# Patient Record
Sex: Female | Born: 1945 | Race: Black or African American | Hispanic: No | Smoking: Former smoker
Health system: Southern US, Community
[De-identification: ages and names within clinical notes are randomized; demographics above are authoritative.]

## PROBLEM LIST (undated history)

## (undated) ENCOUNTER — Emergency Department: Admission: EM | Payer: Self-pay | Source: Home / Self Care

## (undated) DIAGNOSIS — I10 Essential (primary) hypertension: Secondary | ICD-10-CM

## (undated) DIAGNOSIS — N189 Chronic kidney disease, unspecified: Secondary | ICD-10-CM

## (undated) DIAGNOSIS — I509 Heart failure, unspecified: Secondary | ICD-10-CM

## (undated) DIAGNOSIS — G459 Transient cerebral ischemic attack, unspecified: Secondary | ICD-10-CM

## (undated) DIAGNOSIS — N186 End stage renal disease: Secondary | ICD-10-CM

## (undated) DIAGNOSIS — I639 Cerebral infarction, unspecified: Secondary | ICD-10-CM

## (undated) DIAGNOSIS — M869 Osteomyelitis, unspecified: Secondary | ICD-10-CM

## (undated) DIAGNOSIS — Z9289 Personal history of other medical treatment: Secondary | ICD-10-CM

## (undated) DIAGNOSIS — M069 Rheumatoid arthritis, unspecified: Secondary | ICD-10-CM

## (undated) DIAGNOSIS — M79605 Pain in left leg: Secondary | ICD-10-CM

## (undated) DIAGNOSIS — E785 Hyperlipidemia, unspecified: Secondary | ICD-10-CM

## (undated) DIAGNOSIS — C9000 Multiple myeloma not having achieved remission: Secondary | ICD-10-CM

## (undated) DIAGNOSIS — Z862 Personal history of diseases of the blood and blood-forming organs and certain disorders involving the immune mechanism: Secondary | ICD-10-CM

## (undated) DIAGNOSIS — D279 Benign neoplasm of unspecified ovary: Secondary | ICD-10-CM

## (undated) DIAGNOSIS — E8881 Metabolic syndrome: Secondary | ICD-10-CM

## (undated) DIAGNOSIS — N289 Disorder of kidney and ureter, unspecified: Secondary | ICD-10-CM

## (undated) HISTORY — PX: OTHER SURGICAL HISTORY: SHX169

## (undated) HISTORY — PX: APPENDECTOMY: SHX54

## (undated) HISTORY — PX: ABDOMINAL HYSTERECTOMY: SHX81

## (undated) HISTORY — PX: HERNIA REPAIR: SHX51

---

## 2004-12-13 ENCOUNTER — Ambulatory Visit: Payer: Self-pay | Admitting: Internal Medicine

## 2005-01-06 ENCOUNTER — Ambulatory Visit: Payer: Self-pay | Admitting: Internal Medicine

## 2005-03-01 ENCOUNTER — Ambulatory Visit: Payer: Self-pay | Admitting: Family Medicine

## 2005-03-07 ENCOUNTER — Ambulatory Visit: Payer: Self-pay | Admitting: Internal Medicine

## 2005-04-04 ENCOUNTER — Ambulatory Visit: Payer: Self-pay | Admitting: Internal Medicine

## 2005-04-08 ENCOUNTER — Ambulatory Visit: Payer: Self-pay | Admitting: Internal Medicine

## 2005-05-08 ENCOUNTER — Ambulatory Visit: Payer: Self-pay | Admitting: Internal Medicine

## 2005-05-16 ENCOUNTER — Emergency Department: Payer: Self-pay | Admitting: Emergency Medicine

## 2005-05-16 ENCOUNTER — Other Ambulatory Visit: Payer: Self-pay

## 2005-05-23 ENCOUNTER — Emergency Department: Payer: Self-pay | Admitting: Unknown Physician Specialty

## 2005-06-08 ENCOUNTER — Ambulatory Visit: Payer: Self-pay | Admitting: Internal Medicine

## 2005-07-08 ENCOUNTER — Ambulatory Visit: Payer: Self-pay | Admitting: Internal Medicine

## 2005-07-13 ENCOUNTER — Ambulatory Visit: Payer: Self-pay | Admitting: Internal Medicine

## 2005-08-04 ENCOUNTER — Ambulatory Visit: Payer: Self-pay | Admitting: Family Medicine

## 2005-08-08 ENCOUNTER — Ambulatory Visit: Payer: Self-pay | Admitting: Internal Medicine

## 2005-08-11 ENCOUNTER — Emergency Department: Payer: Self-pay | Admitting: Emergency Medicine

## 2005-08-11 ENCOUNTER — Other Ambulatory Visit: Payer: Self-pay

## 2005-08-16 ENCOUNTER — Ambulatory Visit: Payer: Self-pay | Admitting: Unknown Physician Specialty

## 2005-09-06 ENCOUNTER — Ambulatory Visit: Payer: Self-pay | Admitting: Family Medicine

## 2005-09-08 ENCOUNTER — Ambulatory Visit: Payer: Self-pay | Admitting: Internal Medicine

## 2005-09-10 ENCOUNTER — Ambulatory Visit: Payer: Self-pay | Admitting: Internal Medicine

## 2005-10-06 ENCOUNTER — Ambulatory Visit: Payer: Self-pay | Admitting: Internal Medicine

## 2005-10-25 ENCOUNTER — Ambulatory Visit: Payer: Self-pay | Admitting: Family Medicine

## 2005-10-31 ENCOUNTER — Inpatient Hospital Stay: Payer: Self-pay | Admitting: Internal Medicine

## 2005-10-31 ENCOUNTER — Other Ambulatory Visit: Payer: Self-pay

## 2005-11-01 ENCOUNTER — Other Ambulatory Visit: Payer: Self-pay

## 2005-11-06 ENCOUNTER — Ambulatory Visit: Payer: Self-pay | Admitting: Internal Medicine

## 2005-12-06 ENCOUNTER — Ambulatory Visit: Payer: Self-pay | Admitting: Internal Medicine

## 2006-01-06 ENCOUNTER — Ambulatory Visit: Payer: Self-pay | Admitting: Internal Medicine

## 2006-02-05 ENCOUNTER — Ambulatory Visit: Payer: Self-pay | Admitting: Internal Medicine

## 2006-02-13 ENCOUNTER — Inpatient Hospital Stay: Payer: Self-pay | Admitting: Internal Medicine

## 2006-02-13 ENCOUNTER — Other Ambulatory Visit: Payer: Self-pay

## 2006-03-08 ENCOUNTER — Ambulatory Visit: Payer: Self-pay | Admitting: Internal Medicine

## 2006-04-08 ENCOUNTER — Ambulatory Visit: Payer: Self-pay | Admitting: Internal Medicine

## 2006-05-08 ENCOUNTER — Ambulatory Visit: Payer: Self-pay | Admitting: Internal Medicine

## 2006-06-07 ENCOUNTER — Emergency Department: Payer: Self-pay | Admitting: Internal Medicine

## 2006-06-07 ENCOUNTER — Other Ambulatory Visit: Payer: Self-pay

## 2006-06-13 ENCOUNTER — Ambulatory Visit: Payer: Self-pay | Admitting: Internal Medicine

## 2006-07-08 ENCOUNTER — Ambulatory Visit: Payer: Self-pay | Admitting: Internal Medicine

## 2006-08-08 ENCOUNTER — Ambulatory Visit: Payer: Self-pay | Admitting: Internal Medicine

## 2006-08-23 ENCOUNTER — Ambulatory Visit: Payer: Self-pay | Admitting: Unknown Physician Specialty

## 2006-09-08 ENCOUNTER — Ambulatory Visit: Payer: Self-pay | Admitting: Internal Medicine

## 2006-10-07 ENCOUNTER — Ambulatory Visit: Payer: Self-pay | Admitting: Internal Medicine

## 2006-11-07 ENCOUNTER — Ambulatory Visit: Payer: Self-pay | Admitting: Internal Medicine

## 2006-12-07 ENCOUNTER — Ambulatory Visit: Payer: Self-pay | Admitting: Internal Medicine

## 2007-01-07 ENCOUNTER — Ambulatory Visit: Payer: Self-pay | Admitting: Internal Medicine

## 2007-02-06 ENCOUNTER — Ambulatory Visit: Payer: Self-pay | Admitting: Internal Medicine

## 2007-02-15 ENCOUNTER — Ambulatory Visit: Payer: Self-pay | Admitting: Internal Medicine

## 2007-03-09 ENCOUNTER — Ambulatory Visit: Payer: Self-pay | Admitting: Internal Medicine

## 2007-04-09 ENCOUNTER — Ambulatory Visit: Payer: Self-pay | Admitting: Internal Medicine

## 2007-04-26 ENCOUNTER — Ambulatory Visit: Payer: Self-pay | Admitting: Internal Medicine

## 2007-05-09 ENCOUNTER — Ambulatory Visit: Payer: Self-pay | Admitting: Internal Medicine

## 2007-08-09 ENCOUNTER — Ambulatory Visit: Payer: Self-pay | Admitting: Internal Medicine

## 2007-08-16 ENCOUNTER — Ambulatory Visit: Payer: Self-pay | Admitting: Internal Medicine

## 2007-09-09 ENCOUNTER — Ambulatory Visit: Payer: Self-pay | Admitting: Internal Medicine

## 2007-10-07 ENCOUNTER — Ambulatory Visit: Payer: Self-pay | Admitting: Internal Medicine

## 2007-10-15 ENCOUNTER — Ambulatory Visit: Payer: Self-pay | Admitting: Internal Medicine

## 2007-11-07 ENCOUNTER — Ambulatory Visit: Payer: Self-pay | Admitting: Internal Medicine

## 2007-12-18 ENCOUNTER — Ambulatory Visit: Payer: Self-pay | Admitting: Internal Medicine

## 2007-12-28 ENCOUNTER — Ambulatory Visit: Payer: Self-pay | Admitting: Nephrology

## 2008-01-07 ENCOUNTER — Ambulatory Visit: Payer: Self-pay | Admitting: Internal Medicine

## 2008-02-06 ENCOUNTER — Ambulatory Visit: Payer: Self-pay | Admitting: Internal Medicine

## 2008-02-19 ENCOUNTER — Ambulatory Visit: Payer: Self-pay | Admitting: Internal Medicine

## 2008-03-08 ENCOUNTER — Ambulatory Visit: Payer: Self-pay | Admitting: Internal Medicine

## 2008-04-08 ENCOUNTER — Ambulatory Visit: Payer: Self-pay | Admitting: Internal Medicine

## 2008-06-08 ENCOUNTER — Ambulatory Visit: Payer: Self-pay | Admitting: Internal Medicine

## 2008-07-01 ENCOUNTER — Ambulatory Visit: Payer: Self-pay | Admitting: Internal Medicine

## 2008-07-08 ENCOUNTER — Ambulatory Visit: Payer: Self-pay | Admitting: Internal Medicine

## 2008-08-26 ENCOUNTER — Ambulatory Visit: Payer: Self-pay | Admitting: Internal Medicine

## 2008-09-08 ENCOUNTER — Ambulatory Visit: Payer: Self-pay | Admitting: Internal Medicine

## 2008-10-06 ENCOUNTER — Ambulatory Visit: Payer: Self-pay | Admitting: Internal Medicine

## 2008-10-28 ENCOUNTER — Ambulatory Visit: Payer: Self-pay | Admitting: Internal Medicine

## 2008-11-06 ENCOUNTER — Ambulatory Visit: Payer: Self-pay | Admitting: Internal Medicine

## 2008-12-01 ENCOUNTER — Ambulatory Visit: Payer: Self-pay | Admitting: Nephrology

## 2008-12-23 ENCOUNTER — Ambulatory Visit: Payer: Self-pay | Admitting: Internal Medicine

## 2009-01-06 ENCOUNTER — Ambulatory Visit: Payer: Self-pay | Admitting: Internal Medicine

## 2009-09-10 ENCOUNTER — Ambulatory Visit: Payer: Self-pay | Admitting: Rheumatology

## 2010-11-09 ENCOUNTER — Ambulatory Visit: Payer: Self-pay | Admitting: Vascular Surgery

## 2011-05-20 ENCOUNTER — Other Ambulatory Visit: Payer: Self-pay | Admitting: Nephrology

## 2011-06-27 ENCOUNTER — Ambulatory Visit: Payer: Self-pay | Admitting: Nephrology

## 2011-07-21 ENCOUNTER — Encounter: Payer: Self-pay | Admitting: Rheumatology

## 2012-05-15 ENCOUNTER — Ambulatory Visit: Payer: Self-pay | Admitting: Vascular Surgery

## 2012-06-26 ENCOUNTER — Ambulatory Visit: Payer: Self-pay | Admitting: Vascular Surgery

## 2012-07-24 ENCOUNTER — Ambulatory Visit: Payer: Self-pay | Admitting: Vascular Surgery

## 2012-08-10 ENCOUNTER — Emergency Department: Payer: Self-pay | Admitting: Emergency Medicine

## 2013-02-13 LAB — CBC
HCT: 32.2 % — ABNORMAL LOW (ref 35.0–47.0)
MCH: 29.5 pg (ref 26.0–34.0)
RDW: 17.8 % — ABNORMAL HIGH (ref 11.5–14.5)
WBC: 8.2 10*3/uL (ref 3.6–11.0)

## 2013-02-13 LAB — BASIC METABOLIC PANEL
Calcium, Total: 10 mg/dL (ref 8.5–10.1)
Co2: 34 mmol/L — ABNORMAL HIGH (ref 21–32)
Creatinine: 6.62 mg/dL — ABNORMAL HIGH (ref 0.60–1.30)
EGFR (African American): 7 — ABNORMAL LOW
EGFR (Non-African Amer.): 6 — ABNORMAL LOW
Glucose: 104 mg/dL — ABNORMAL HIGH (ref 65–99)
Sodium: 138 mmol/L (ref 136–145)

## 2013-02-14 ENCOUNTER — Inpatient Hospital Stay: Payer: Self-pay | Admitting: Internal Medicine

## 2013-02-15 LAB — BASIC METABOLIC PANEL
Anion Gap: 12 (ref 7–16)
Chloride: 96 mmol/L — ABNORMAL LOW (ref 98–107)
Creatinine: 9.95 mg/dL — ABNORMAL HIGH (ref 0.60–1.30)
EGFR (African American): 4 — ABNORMAL LOW
Glucose: 85 mg/dL (ref 65–99)
Osmolality: 282 (ref 275–301)
Potassium: 4.9 mmol/L (ref 3.5–5.1)

## 2013-02-15 LAB — CBC WITH DIFFERENTIAL/PLATELET
Basophil %: 0.7 %
Eosinophil #: 0.4 10*3/uL (ref 0.0–0.7)
Eosinophil %: 6.1 %
HGB: 10 g/dL — ABNORMAL LOW (ref 12.0–16.0)
MCHC: 33.4 g/dL (ref 32.0–36.0)
MCV: 92 fL (ref 80–100)
Monocyte #: 0.8 x10 3/mm (ref 0.2–0.9)
Monocyte %: 12.4 %
Neutrophil #: 3.2 10*3/uL (ref 1.4–6.5)
Neutrophil %: 49.1 %
Platelet: 240 10*3/uL (ref 150–440)
RBC: 3.27 10*6/uL — ABNORMAL LOW (ref 3.80–5.20)
WBC: 6.6 10*3/uL (ref 3.6–11.0)

## 2013-02-15 LAB — PHOSPHORUS: Phosphorus: 7.9 mg/dL — ABNORMAL HIGH (ref 2.5–4.9)

## 2013-02-16 LAB — RENAL FUNCTION PANEL
Chloride: 96 mmol/L — ABNORMAL LOW (ref 98–107)
Creatinine: 7.35 mg/dL — ABNORMAL HIGH (ref 0.60–1.30)
EGFR (Non-African Amer.): 5 — ABNORMAL LOW
Potassium: 4.7 mmol/L (ref 3.5–5.1)
Sodium: 134 mmol/L — ABNORMAL LOW (ref 136–145)

## 2013-02-16 LAB — CBC WITH DIFFERENTIAL/PLATELET
Basophil #: 0 10*3/uL (ref 0.0–0.1)
Basophil %: 0.6 %
Eosinophil #: 0.4 10*3/uL (ref 0.0–0.7)
Eosinophil %: 4.9 %
HCT: 30.5 % — ABNORMAL LOW (ref 35.0–47.0)
HGB: 10 g/dL — ABNORMAL LOW (ref 12.0–16.0)
Lymphocyte #: 2.2 10*3/uL (ref 1.0–3.6)
MCHC: 32.8 g/dL (ref 32.0–36.0)
MCV: 92 fL (ref 80–100)
Monocyte #: 1 x10 3/mm — ABNORMAL HIGH (ref 0.2–0.9)
Neutrophil #: 4 10*3/uL (ref 1.4–6.5)
Neutrophil %: 52.7 %
RBC: 3.33 10*6/uL — ABNORMAL LOW (ref 3.80–5.20)
RDW: 17.5 % — ABNORMAL HIGH (ref 11.5–14.5)

## 2013-02-18 LAB — RENAL FUNCTION PANEL
Albumin: 3.1 g/dL — ABNORMAL LOW (ref 3.4–5.0)
Anion Gap: 9 (ref 7–16)
BUN: 85 mg/dL — ABNORMAL HIGH (ref 7–18)
Chloride: 97 mmol/L — ABNORMAL LOW (ref 98–107)
Creatinine: 12.18 mg/dL — ABNORMAL HIGH (ref 0.60–1.30)
EGFR (African American): 3 — ABNORMAL LOW
EGFR (Non-African Amer.): 3 — ABNORMAL LOW
Osmolality: 289 (ref 275–301)
Phosphorus: 7 mg/dL — ABNORMAL HIGH (ref 2.5–4.9)
Sodium: 132 mmol/L — ABNORMAL LOW (ref 136–145)

## 2013-02-18 LAB — CBC WITH DIFFERENTIAL/PLATELET
HCT: 30.6 % — ABNORMAL LOW (ref 35.0–47.0)
HGB: 10 g/dL — ABNORMAL LOW (ref 12.0–16.0)
Lymphocytes: 25 %
MCV: 91 fL (ref 80–100)
Segmented Neutrophils: 65 %

## 2013-02-18 LAB — EXPECTORATED SPUTUM ASSESSMENT W GRAM STAIN, RFLX TO RESP C

## 2013-02-19 LAB — CULTURE, BLOOD (SINGLE)

## 2013-03-07 ENCOUNTER — Ambulatory Visit: Payer: Self-pay | Admitting: Internal Medicine

## 2014-03-26 ENCOUNTER — Observation Stay: Payer: Self-pay | Admitting: Internal Medicine

## 2014-03-26 LAB — COMPREHENSIVE METABOLIC PANEL
ALK PHOS: 82 U/L
ALT: 9 U/L — AB
Albumin: 3 g/dL — ABNORMAL LOW (ref 3.4–5.0)
Anion Gap: 9 (ref 7–16)
BUN: 19 mg/dL — ABNORMAL HIGH (ref 7–18)
Bilirubin,Total: 0.3 mg/dL (ref 0.2–1.0)
CALCIUM: 8.2 mg/dL — AB (ref 8.5–10.1)
CHLORIDE: 98 mmol/L (ref 98–107)
CREATININE: 5.17 mg/dL — AB (ref 0.60–1.30)
Co2: 31 mmol/L (ref 21–32)
EGFR (Non-African Amer.): 8 — ABNORMAL LOW
GFR CALC AF AMER: 9 — AB
Glucose: 76 mg/dL (ref 65–99)
OSMOLALITY: 277 (ref 275–301)
Potassium: 3.8 mmol/L (ref 3.5–5.1)
SGOT(AST): 16 U/L (ref 15–37)
SODIUM: 138 mmol/L (ref 136–145)
Total Protein: 8.6 g/dL — ABNORMAL HIGH (ref 6.4–8.2)

## 2014-03-26 LAB — PROTIME-INR
INR: 1.1
PROTHROMBIN TIME: 13.6 s (ref 11.5–14.7)

## 2014-03-26 LAB — CBC
HCT: 32.5 % — ABNORMAL LOW (ref 35.0–47.0)
HGB: 10.2 g/dL — ABNORMAL LOW (ref 12.0–16.0)
MCH: 28.8 pg (ref 26.0–34.0)
MCHC: 31.6 g/dL — ABNORMAL LOW (ref 32.0–36.0)
MCV: 91 fL (ref 80–100)
PLATELETS: 261 10*3/uL (ref 150–440)
RBC: 3.56 10*6/uL — AB (ref 3.80–5.20)
RDW: 17.1 % — ABNORMAL HIGH (ref 11.5–14.5)
WBC: 7.3 10*3/uL (ref 3.6–11.0)

## 2014-03-26 LAB — TROPONIN I
TROPONIN-I: 0.07 ng/mL — AB
TROPONIN-I: 0.06 ng/mL — AB
Troponin-I: 0.05 ng/mL

## 2014-03-26 LAB — APTT: ACTIVATED PTT: 36.4 s — AB (ref 23.6–35.9)

## 2014-03-26 LAB — CK TOTAL AND CKMB (NOT AT ARMC)
CK, TOTAL: 100 U/L
CK-MB: 1.4 ng/mL (ref 0.5–3.6)

## 2014-03-27 DIAGNOSIS — I359 Nonrheumatic aortic valve disorder, unspecified: Secondary | ICD-10-CM

## 2014-11-25 NOTE — Op Note (Signed)
PATIENT NAME:  Cassandra Joseph, Cassandra Joseph MR#:  [id number] DATE OF BIRTH:  1946-02-18  DATE OF PROCEDURE:  06/26/2012  PREOPERATIVE DIAGNOSES:  1. Complication AV dialysis device with poor flows and poor dialysis runs.  2. End-stage renal disease requiring hemodialysis.   POSTOPERATIVE DIAGNOSES:  1. Complication AV dialysis device with poor flows and poor dialysis runs.  2. End-stage renal disease requiring hemodialysis.   PROCEDURES PERFORMED:  1. Contrast injection right radiocephalic fistula.  2. Percutaneous transluminal angioplasty venous portion right radiocephalic fistula.   PROCEDURE PERFORMED BY: Katha Cabal, MD  SEDATION: Versed 3 mg plus fentanyl 150 mcg administered IV. Continuous ECG, pulse oximetry and cardiopulmonary monitoring is performed throughout the entire procedure by the interventional radiology nurse. Total sedation time was one hour.   ACCESS: 6 French sheath, antegrade direction right arm wrist fistula.   CONTRAST USED: Isovue 40 mL.   FLUOROSCOPY TIME: 1.3 minutes.   INDICATIONS: Cassandra Joseph is a 69 year old woman who was sent to the office by the dialysis center secondary to increasing difficulties with dialysis and poor efficiencies of her dialysis. The risks and benefits for contrast injection and treatment were reviewed. All questions answered. Patient agrees to proceed.   DESCRIPTION OF PROCEDURE: Patient is taken to special procedures, placed in supine position. After adequate sedation is achieved, she is positioned with her right arm extended palm upward. Right arm is prepped and draped in sterile fashion. 1% lidocaine is infiltrated into the soft tissues and access to the fistula is obtained with a micropuncture needle in an antegrade direction, microwire followed micro sheath, J-wire followed by 6 French sheath. Hand injection of contrast demonstrated a high-grade stenosis several centimeters from the arterial anastomosis. She subsequently then dilates at  the buttonholes. The venous outflow is otherwise well collateralized and free of stenoses including the central venous anatomy.   2000 units of heparin is given. The sheath is repositioned as superficially as possible and subsequently a 7 x 2 [place] balloon is advanced across the lesion, inflated to 26 atmospheres. One minute duration time. Follow-up angiography demonstrates significant improvement in the stricture, however, there is moderate extravasation. The wire and sheath are then pulled. Pursestring suture of 4-0 Monocryl placed and medium pressure is held for five minutes. There is immediate complication of hematoma at the site but it appears stable and not changing.   INTERPRETATION: Initial views of the fistula demonstrate the site to the buttonholes are nicely dilated and should be easily accessible. Venous outflow is widely patent in the proximal forearm and upper arm. Central venous anatomy is widely patent. Arterial anastomosis is widely patent as is the visualized portions of the brachial artery. Approximately 2 to 3 cm from the actual anastomosis there is a focal area of high-grade stricture which is essentially occlusive by the sheath. After dilatation to 7 mm this area now shows some extravasation but it is significantly improved in its diameter.   SUMMARY: Angioplasty venous portion of the right arm AV graft.   ____________________________ Katha Cabal, MD ggs:cms D: 06/26/2012 15:14:01 ET T: 06/26/2012 15:34:19 ET JOB#: [id number]  cc: Katha Cabal, MD, <Dictator> [employer] [employer] Tracie Harrier, MD Katha Cabal MD ELECTRONICALLY SIGNED 07/25/2012 16:22

## 2014-11-25 NOTE — Op Note (Signed)
PATIENT NAME:  Cassandra Joseph, Cassandra Joseph MR#:  [id number] DATE OF BIRTH:  11-07-1945  DATE OF PROCEDURE:  05/15/2012  PREOPERATIVE DIAGNOSES:  1. Complication of dialysis device, right arm fistula, with prolonged bleeding from the buttonhole. 2. End-stage renal disease requiring hemodialysis.   POSTOPERATIVE DIAGNOSES:  1. Complication of dialysis device, right arm fistula, with prolonged bleeding from the buttonhole. 2. End-stage renal disease requiring hemodialysis.   PROCEDURES PERFORMED: Contrast injection, right arm AV fistula.   SURGEON: Hortencia Pilar, MD  SEDATION: Versed 2 mg plus fentanyl 50 mcg administered IV. Continuous ECG, pulse oximetry and cardiopulmonary monitoring was performed throughout the entire procedure by the interventional radiology nurse. Total sedation time was 45 minutes.   ACCESS: 5 [place] microsheath, right arm AV fistula, antegrade direction.   CONTRAST USED: Isovue 20 mL.   FLUOROSCOPY TIME: 0.6 minutes.   INDICATIONS: [name] is a 69 year old woman who is currently maintained on hemodialysis via a right arm radiocephalic fistula. Recently she has had increased bleeding from buttonholes and she has been referred from dialysis for evaluation. The risks and benefits were reviewed, all questions answered, and the patient has agreed to proceed with contrast injection and possible intervention.   DESCRIPTION OF PROCEDURE: The patient is taken to special procedures and placed in the supine position. After adequate sedation is achieved, the left arm is extended palm upward and prepped and draped in sterile fashion. 1% lidocaine is infiltrated into the soft tissues and access to the fistula is obtained with a micropuncture needle in an antegrade direction. Microsheath is inserted. A stopcock is placed. Hand injection of contrast through the microsheath is then utilized to image the fistula. After review of the images, sheath is pulled after pursestring suture of 4-0  Monocryl is placed. There are no immediate complications.   INTERPRETATION: The fistula is noted to be widely patent. It is somewhat tortuous, however, in the areas of the two buttonholes it is dilated to greater than 10 mm in diameter. There are two areas of modest or moderate narrowing located before the buttonholes, in the antecubital fossa. Upper arm and central veins are all widely patent and quite enlarged. Arterial visualized portions of the brachial artery are widely patent as well.   Given that the area of narrowing is moderate at best and that it occurs before the buttonholes, there is no anatomic abnormality that is causing bleeding from the buttonholes themselves. More proximal to the areas of access, the venous system is widely patent. In fact it is enlarged throughout and flow is unobstructed back to the heart.   Perhaps this is an abnormality intrinsic to the buttonhole itself, but there does not appear to be a particular abnormality that should be treated at this time within the fistula. Therefore, no intervention was performed. Fistula appears to be quite adequate for continued dialysis.  ____________________________ Katha Cabal, MD ggs:slb D: 05/15/2012 11:48:16 ET T: 05/15/2012 11:57:05 ET JOB#: [id number]  cc: Katha Cabal, MD, <Dictator> Tracie Harrier, MD Murlean Iba, MD Katha Cabal MD ELECTRONICALLY SIGNED 05/23/2012 12:34

## 2014-11-25 NOTE — Op Note (Signed)
PATIENT NAME:  Cassandra Joseph, Cassandra Joseph MR#:  [id number] DATE OF BIRTH:  05/05/1946  DATE OF PROCEDURE:  07/24/2012  PREOPERATIVE DIAGNOSES:  1.  Complication of arteriovenous dialysis fistula with persistent bleeding from the distal buttonhole.  2.  End-stage renal disease requiring hemodialysis.  3.  Hypertension.   POSTOPERATIVE DIAGNOSES: 1.  Complication of arteriovenous dialysis fistula with persistent bleeding from the distal buttonhole.  2.  End-stage renal disease requiring hemodialysis.  3.  Hypertension.   PROCEDURES PERFORMED: Contrast injection right radiocephalic fistula.   SURGEON: Katha Cabal, M.D.  SEDATION: Versed 3 mg plus fentanyl 100 mcg administered IV. Continuous ECG, pulse oximetry and cardiopulmonary monitoring was performed throughout the entire procedure by the interventional radiology nurse. Total sedation time was 30 minutes.   ACCESS: 6-French sheath, retrograde direction, right radiocephalic fistula.   CONTRAST USED: Isovue 5 mL.   FLUOROSCOPY TIME: 0.8 minutes.   INDICATIONS: The patient is a 69 year old woman, who presents with persistent bleeding from her buttonhole. She is therefore undergoing evaluation for possible corrective cause. Risks and benefits were reviewed. The patient agrees to proceed.   DESCRIPTION OF PROCEDURE: The patient is taken to special procedures and placed in the supine position. After adequate sedation is achieved, she is positioned supine with her right arm positioned palm upward. The right arm is prepped and draped in sterile fashion. Ultrasound is placed in a sterile sleeve. The fistula is then followed and identified toward the level of the antecubital fossa. Micropuncture needle followed by microwires is then inserted under direct ultrasound visualization. The venous outflow of the fistula is patent, homogeneous and compressible indicating patency. Image is recorded for the permanent record. The puncture is made under direct  visualization. Micro sheath is inserted followed by J-wire, and then a 6-French sheath. Using a floppy Glidewire and a Kumpe catheter, the wire and catheter were negotiated into the arterial anastomosis of the fistula and then hand injection of contrast was utilized to demonstrate the anatomy of the fistula. With the buttonhole marked externally, the only problem with the fistula, which is a moderately narrowed area, is occurring before the buttonhole and therefore the buttonhole is actually already decompressed. There does not appear to be a correctable cause for persistent bleeding. The fistula otherwise is widely patent with excellent flow and there does not appear to be any outflow obstruction, which would pressurize the fistula. Therefore, the sheath is removed after pursestring suture of 4-0 Monocryl is placed. Then,  lidocaine is infiltrated into the distal buttonhole and a figure-of-eight of 4-0 Monocryl is then placed to obliterate the buttonhole. The Monocryl suture will prevent further hemorrhage and either new buttonhole can be matured or the fistula can be accessed in a standard fashion.  ____________________________ Katha Cabal, MD ggs:aw D: 07/24/2012 17:36:05 ET T: 07/25/2012 09:55:07 ET JOB#: [id number]  cc: Katha Cabal, MD, <Dictator> [facility] DR. Holley Raring DR. [facility] DR. Isaias Cowman Katha Cabal MD ELECTRONICALLY SIGNED 07/25/2012 16:24

## 2014-11-28 NOTE — H&P (Signed)
PATIENT NAME:  Cassandra Joseph, Cassandra Joseph MR#:  [id number] DATE OF BIRTH:  [birth year]  DATE OF ADMISSION:  02/14/2013  PRIMARY CARE PHYSICIAN: non local  REFERRING PHYSICIAN: Valli Glance. Owens Shark, MD  CHIEF COMPLAINT: Productive cough and chills for 2 days.   HISTORY OF PRESENT ILLNESS: The patient is a 69 year old pleasant African-American female with a past medical history of end-stage renal disease, on hemodialysis on Mondays, Wednesdays and Fridays, and gets dialysis at [facility] by Dr. Smith Mince, and lately has been getting dialysis 4 times a week, and the last one was done on Wednesday, who is presenting to the ER with a chief complaint of 2-day history of productive cough with yellowish phlegm and chills. The patient has noticed a little bit of blood while coughing yesterday 1 time only. As her cough is getting worse, she came into the ER, and chest x-ray has revealed infiltrates. The patient's pulse oximetry was at 88% on room air. Hospitalist team is called to admit the patient for pneumonia. During my examination, the patient is resting comfortably and denies any chest pain or shortness of breath. She has chronic history of multiple myeloma and follows with Dr. Inez Pilgrim as an outpatient, and she reports that she is under remission for the past 6 years.   PAST MEDICAL HISTORY:  1. Multiple myeloma.  2. Chronic anemia.  3. Hypertension.  4. End-stage renal disease.   PAST SURGICAL HISTORY:  1. Hysterectomy> 2. Right forearm AV fistula.   ALLERGIES: SHE IS ALLERGIC TO SHELLFISH.   PSYCHOSOCIAL HISTORY: Lives at home with husband. She quit smoking 7 years ago. Denies alcohol or illicit drug usage.   FAMILY HISTORY: Dad had history of throat cancer. Mom had a history of hypertension and seizures.   REVIEW OF SYSTEMS:  CONSTITUTIONAL: Denies any fatigue, fever, weakness,  EYES: Denies any blurry vision, glaucoma, cataracts.  ENT: Denies any epistaxis, discharge, snoring.  RESPIRATION: Complaining  of productive cough with yellowish phlegm and streaks of blood only 1 time in the sputum. Denies any wheezing or dyspnea.  CARDIOVASCULAR: No chest pain, palpitations, syncope.  GASTROINTESTINAL: Denies any nausea, vomiting, diarrhea.  GENITOURINARY: No dysuria or hematuria.  GYNECOLOGIC AND BREASTS: Denies any breast mass or vaginal discharge. Status post hysterectomy.  ENDOCRINE: Denies any polyuria, nocturia, thyroid problems.  HEMATOLOGIC AND LYMPHATIC: Has chronic history of anemia, chronic history of multiple myeloma. Denies any easy bruising.  INTEGUMENTARY: No acne, rash, lesions.  MUSCULOSKELETAL: No joint pain in the neck, back, shoulder. Denies any gout.  NEUROLOGIC: Denies any history of vertigo, ataxia or stroke in the past.  PSYCHIATRIC: Denies any ADD, OCD, bipolar disorder.   PHYSICAL EXAMINATION: VITAL SIGNS: Temperature 98.4, pulse 69 to 76, respirations 18, blood pressure initially 228/95, during my examination 189/86, satting 100% on 2 liters of oxygen.  GENERAL APPEARANCE: Not under acute distress. Moderately built and moderately nourished.  HEENT: Normocephalic, atraumatic. Pupils are equally reacting to light and accommodation. No scleral icterus. No conjunctival injection. No sinus tenderness. No postnasal drip. No pharyngeal exudates.  NECK: Supple. No JVD. No thyromegaly. No lymphadenopathy.  LUNGS: Positive crackles bilaterally, more on the right side. No anterior chest wall tenderness on palpation. No accessory muscle use.  CARDIOVASCULAR: S1 and S2 normal. Regular rate and rhythm. Positive murmur.  GASTROINTESTINAL: Soft. Bowel sounds are positive in all 4 quadrants. Nontender, nondistended. No masses felt. No hepatosplenomegaly.  NEUROLOGIC: Awake, alert and oriented x3. Cranial nerves II through XII are grossly intact. Motor and sensory are grossly intact.  Reflexes are 2+.  EXTREMITIES: No edema. No cyanosis. No clubbing.  SKIN: Warm to touch. Normal turgor. No  rashes. No lesions.  MUSCULOSKELETAL: No joint effusion, tenderness or erythema.  PSYCHIATRIC: Normal mood and affect.   LABORATORY AND IMAGING STUDIES: Glucose 104, BUN 28, creatinine 6.62, sodium 138, potassium 4.4, chloride 99, CO2 34, anion gap 5, GFR 7, serum osmolality 281, calcium 10.0. WBC 8.2, hemoglobin 10.4, hematocrit 32.2, platelets 252, MCV 91. A 12-lead EKG: Sinus rhythm at 75 beats per minute, normal PR and QRS intervals. Left ventricular hypertrophy is present. No ST-T wave changes. A chest x-ray with cardiomegaly and positive infiltrates bilaterally.   ASSESSMENT AND PLAN: A 69 year old African-American female presenting with end-stage renal disease, on hemodialysis, with a history of productive cough and chills for 2 days, with elevated blood pressure, but the patient is reporting that her systolic blood pressure always runs at around 200 before dialysis and at around 170s to 180s after dialysis. She will be admitted with the following assessment and plan.   1. Pneumonia, most likely community acquired. Will get sputum cultures and blood cultures and start her on IV Rocephin and Zithromax.  2. End-stage renal disease, on hemodialysis. Her last dialysis was done on Wednesday. Will put in a nephrology consult to Dr. Holley Raring, who is on call.  3. Hypertension, chronically elevated and uncontrolled. Baseline systolic after hemodialysis is 170s, as reported by the patient. Will continue her home medications and titrate as needed.  4. Chronic history of multiple myeloma. The patient is reporting that she is in remission for the past 6 years. Will put in a routine consult to Dr. Inez Pilgrim. 5. Will provide her gastrointestinal and deep vein thrombosis prophylaxis.   CODE STATUS: She is full code. Daughter is her medical POA.    Total time spent on the admission, including reviewing old medical records, discussing with ER staff, discussing with the patient and physical examination is 50  minutes.  ____________________________ Nicholes Mango, MD ag:OSi D: 02/14/2013 04:36:21 ET T: 02/14/2013 05:49:38 ET JOB#: [id number]  cc: Nicholes Mango, MD, <Dictator> Tracie Harrier, MD Trinda Pascal, MD  Nicholes Mango MD ELECTRONICALLY SIGNED 02/14/2013 6:34

## 2014-11-28 NOTE — Consult Note (Signed)
PATIENT NAME:  Cassandra Joseph, PILAT MR#:  [id number] DATE OF BIRTH:  1946-03-09  DATE OF CONSULTATION:  02/14/2013  REFERRING PHYSICIAN:  Nicholes Mango, MD CONSULTING PHYSICIAN:  Munsoor Lilian Kapur, MD  REASON FOR CONSULTATION: Evaluation and management of end-stage renal disease in a hemodialysis patient.   HISTORY OF PRESENT ILLNESS: The patient is a very pleasant 69 year old African-American female with past medical history of end-stage renal disease, on hemodialysis for 6 years, followed by [facility] nephrology at [facility], hypertension, secondary hyperparathyroidism, anemia of chronic kidney disease, multiple myeloma, history of hysterectomy and left knee surgery, who presented to [facility] with complaints of cough, fever and chills. Initial workup demonstrated right lung infiltrate. She was felt to have pneumonia and brought in for the same. She was started on ceftriaxone for treatment. Currently this morning, she is afebrile with a temperature of 98.3. The patient reports that she went to dialysis yesterday and completed her treatment. She reports to me that her estimated dry weight is 125.5 kg. Her weight currently is 127.5 kg. Recently, she has been receiving dialysis 4 times a week for high intradialytic weight gain. She also has secondary hyperparathyroidism for which she takes Sensipar as well as Renvela. She also has history of anemia of chronic kidney disease with most recent hemoglobin of 10.4. She is not yet on the waiting list for renal transplantation.   PAST MEDICAL HISTORY:  1. End-stage renal disease, on hemodialysis for 6 years, followed by [facility] nephrology at [facility]. Estimated dry weight of 125.5 kg.  2. Hypertension.  3. Secondary hyperparathyroidism.  4. Anemia of chronic kidney disease.  5. History of multiple myeloma, followed by Dr. Inez Pilgrim.  6. Right upper extremity AV fistula placement.  7. Hysterectomy.  8. Left knee surgery.    ALLERGIES: SHELLFISH.   CURRENT INPATIENT MEDICATIONS: Include: 1. Amlodipine 10 mg daily.  2. Azithromycin 500 mg IV q.24 hours.  3. Ceftriaxone 1 gram IV q.24 hours.  4. Sensipar 90 mg p.o. q.24 hours.  5. Enalapril 20 mg p.o. daily.  6. Heparin 5000 units subcutaneous q.8 hours.  7. Labetalol 300 mg p.o. b.i.d. 8. Metoprolol 10 mg IV q.6 hours p.r.n. systolic blood pressure greater than 160. 9. Zofran 4 mg p.o. q.6 hours p.r.n. nausea, vomiting. 10. Zantac 150 mg p.o. daily.   SOCIAL HISTORY: The patient resides in [place]. She is married. She has 4 children. She used to work as a Optometrist. She quit smoking tobacco products 7 years ago. She denies alcohol or illicit drug use.   FAMILY HISTORY: Mother is deceased and has history of seizure disorder. Father is deceased secondary to prostate cancer. She has a cousin who was on dialysis here locally.   REVIEW OF SYSTEMS:  CONSTITUTIONAL: Endorses fevers and chills.  EYES: Denies diplopia or blurry vision.  HEENT: Denies headaches, hearing loss. Denies epistaxis.  CARDIOVASCULAR: Denies chest pain, palpitations, PND.  RESPIRATORY: Endorses cough with productive sputum and 1 episode of hemoptysis.  GASTROINTESTINAL: Denies vomiting or bloody stools. Has some nausea.  GENITOURINARY: Denies frequency, urgency or dysuria. Reports diminished urine output over the years that she has been on dialysis.  MUSCULOSKELETAL: Denies joint pain, swelling or redness.  INTEGUMENTARY: Denies skin rashes or lesions.  NEUROLOGIC: Denies focal extremity numbness, weakness or tingling.  PSYCHIATRIC: Denies depression, bipolar disorder.  ENDOCRINE: Denies polyuria, polydipsia or polyphagia.  HEMATOLOGIC AND LYMPHATIC: Denies easy bruisability or bleeding. She has history of anemia of chronic kidney disease.  ALLERGY AND IMMUNOLOGIC: Denies seasonal allergies or history of immunodeficiency.   PHYSICAL EXAMINATION:  VITAL SIGNS: Temperature  98.3, pulse 66, respirations 18, blood pressure 191/80, pulse oximetry 99% on 2 liters.  GENERAL: Reveals an obese African-American female who appears her stated age, currently in no acute distress.  HEENT: Normocephalic, atraumatic. Extraocular movements are intact. Pupils equal, round and reactive to light. No scleral icterus. Conjunctivae are pale. No epistaxis noted. Gross hearing intact. Oral mucosa moist.  NECK: Supple, without JVD or lymphadenopathy.  LUNGS: Demonstrate right-sided rales. Otherwise, good air entry noted with normal respiratory effort.  CARDIOVASCULAR: S1, S2, regular rate and rhythm. No murmurs, rubs or gallops appreciated.  ABDOMEN: Obese, soft, nontender, nondistended. Bowel sounds positive. No rebound or guarding. No gross organomegaly appreciated.  EXTREMITIES: No clubbing, cyanosis or edema.  NEUROLOGIC: The patient is alert and oriented to time, person and place. Strength is 5 out of 5 in both upper and lower extremities.  SKIN: Warm and dry. No rashes noted.  GENITOURINARY: No suprapubic tenderness is noted at this time.  MUSCULOSKELETAL: No joint redness, swelling or tenderness appreciated.  PSYCHIATRIC: The patient with appropriate affect and appears to have good insight into her current illness.   LABORATORY DATA: Blood cultures x 2 sets are negative. Chest x-ray shows right middle lobe and lingular atelectasis versus infiltrate. CBC shows WBC is 8.2, hemoglobin 10.4, hematocrit 32, platelets 252. BMP shows sodium 138, potassium 4.4, chloride 99, CO2 34, BUN 28, creatinine 6.62, glucose 104. Anion gap of 5.   IMPRESSION: This is a 69 year old African-American female with past medical history of end-stage renal disease, on hemodialysis, followed by [facility] at [facility], hypertension, secondary hyperparathyroidism, anemia of chronic kidney disease, multiple myeloma, right upper extremity arteriovenous fistula, hysterectomy and left knee surgery, who  presented to [facility] with cough, shortness of breath and fever, and found to have right-sided infiltrate consistent with pneumonia.   1. End-stage renal disease, on hemodialysis Monday, Wednesday, Friday. The patient is followed by [facility]phrology as an outpatient. She reports to me that her estimated dry weight is 125.5 kg. She achieved 126.1 kg after dialysis yesterday. Recently, she has required dialysis 4 times a week. Her weight currently is 127.5. No urgent indication for dialysis at present. We will plan for dialysis tomorrow using her right upper extremity AV fistula.  2. Pneumonia. This appears to be community acquired. The patient has been started on ceftriaxone and azithromycin. Would monitor her clinical progress.  3. Secondary hyperparathyroidism: The patient is currently maintained on Sensipar 90 mg p.o. daily. She also reports to me that she is on Renvela 1600 mg p.o. t.i.d. with meals. We will add this to her medication regimen and check intact PTH and phosphorus tomorrow.  4. Anemia of chronic kidney disease. Hemoglobin currently noted as being 10.4. We will start the patient on Epogen 10,000 units IV with hemodialysis tomorrow.   I would like to thank Dr. Margaretmary Eddy for this kind referral. Further plan as the patient progresses.  ____________________________ Tama High, MD mnl:OSi D: 02/14/2013 13:45:42 ET T: 02/14/2013 13:58:48 ET JOB#: [id number]  cc: Tama High, MD, <Dictator> Mariah Milling LATEEF MD ELECTRONICALLY SIGNED 03/11/2013 11:20

## 2014-11-28 NOTE — Discharge Summary (Signed)
PATIENT NAME:  Cassandra Joseph, Cassandra Joseph MR#:  [id number] DATE OF BIRTH:  1946-02-23  DATE OF ADMISSION:  02/14/2013 DATE OF DISCHARGE:  02/18/2013  ADMITTING DIAGNOSIS: Pneumonia.   DISCHARGE DIAGNOSES: 1.  Pneumonia.  2.  Atelectasis.  3.  Hypoxia due to atelectasis as well as pneumonia, resolved with therapy.  4.  End-stage renal disease, hemodialysis on Mondays, Wednesdays, Fridays, via right arm AV fistula.  5.  Hypertension, poorly controlled.  6.  Anemia of chronic disease on Epogen  with hemodialysis.   7.   Lower extremity edema. No deep vein thrombosis.  8.  Secondary hyperparathyroidism.   DISCHARGE CONDITION: Stable.   DISCHARGE MEDICATIONS: The patient is to continue:  1.  Amlodipine 10 mg p.o. daily.  2.  Renvela 800 mg p.o. 3 times daily.  3.  Dialyvite, which is dialysis vitamin, 1 tablet once a day.  4.  Sensipar 60 mg p.o. once daily.  5.  Nephro-Vite with vitamin B complex with C as well as folic acid 1 tablet once daily.  6.  Furosemide 80 mg p.o. once daily.  7.  Docusate sodium 100 mg p.o. 1 to 2 capsules twice daily as needed.  8.  Labetalol 300 mg p.o. twice daily.  9.  Enalapril advanced dose to 40 mg p.o. twice daily.   DIET: Hemodialysis diet, 1500 mL fluid restriction, regular consistency.   ACTIVITY LIMITATIONS: As tolerated.    FOLLOW-UP APPOINTMENT: Dr. Ginette Pitman in 2 days after discharge.   CONSULTANTS: Care management, Dr. Juleen China, Dr. Holley Raring.   RADIOLOGIC STUDIES: Chest x-ray PA and lateral, 02/13/2013 revealed some right middle lobe and lingular atelectasis versus infiltrate or fibrosis. Heart was  mildly enlarged according to radiologist.   Repeated chest x-ray, PA and lateral 02/16/2013, revealed persistent areas of increased density in the lungs as described, stable cardiomegaly.   Ultrasound of lower extremities February 17, 2013, showed no evidence of deep vein thrombosis in either lower extremity.   HOSPITAL COURSE:  The patient is a 69 year old  African American female with past medical history significant for history of multiple myeloma, anemia, end-stage renal disease, hypertension, who presents to the hospital with cough as well as phlegm production, as well as chills. Please refer to Dr. Rinaldo Ratel admission note on 02/14/2013. On arrival to the hospital, the patient was complaining of 2-day history of productive cough, yellow phlegm and chills. Her oxygen saturation was 88% on room air. She was admitted to the hospital for suspected pneumonia. Her chest x-ray showed atelectasis versus pneumonia in the right middle lobe. Her vital signs on the day of admission showed temperature of 98.4, pulse was 69 to 76, respiration rate was 18, blood pressure was 228/95 initially; however, later on 189/86. Oxygen saturation was 100% on 2 liters of oxygen through nasal cannula and 88% on room air.   Physical exam showed crackles bilaterally, more on the right side. No other abnormalities were noted on lung exam or overall exam.   The patient's lab data done on the day of admission, 02/13/2013,  showed elevated BUN and creatinine of 28 and 6.62 respectively, glucose 104; otherwise, BMP was unremarkable. The patient's estimated GFR for African American would be 7. Liver enzymes were not checked. Patient's albumin level was checked on her February 16, 2013 and was low at 3.0. CBC: White blood cell count was 8.2, hemoglobin was 10.4 and platelet count was 252. Blood cultures taken on 02/14/2013 showed no growth. Sputum cultures were received on 02/15/2013, also showed just normal bacterial  flora at 48 hours. The patient's EKG showed normal sinus rhythm at 75 beats per minute, possible left atrial enlargement, left ventricular hypertrophy, but no significant change since prior EKG done in October 2007. Chest x-ray as mentioned above, revealed possible pneumonia. The patient was admitted to the hospital for further evaluation. She was started on antibiotic therapy with  Rocephin as well as Zithromax. Over  the period of days, she received antibiotics and her condition improved. She was, however, somewhat hypoxic requiring incentive spirometry. With incentive spirometry as well as antibiotics, her oxygenation improved as well. On the day of discharge, patient's oxygen saturation is 97% on room air at rest. She was also noted to have significantly elevated blood pressure. Her blood pressure medications were advanced. Her Vasotec was advanced to 40 mg twice daily dose. It is recommended to follow the patient's blood pressure readings and make decisions about advancement of her blood pressure medications, even higher if needed. The patient's blood pressure prior to going to hemodialysis today on 02/18/2013 is 165/72. If it is somewhat better after hemodialysis, she will likely be discharged home on current medications and follow up with her primary physician, Dr. Ginette Pitman.   In regards to multiple myeloma, the patient is to continue follow-up as she previously did in the past.   Chronic end-stage kidney disease. The patient is to continue follow up with [facility] nephrology, Dr. Smith Mince, on Mondays, Wednesdays and Fridays, hemodialysis. The patient was noted to have some lower extremity swelling. However, no deep vein thrombosis on ultrasound. She is being discharged in stable condition with above-mentioned medications and follow-up.   TIME SPENT: 40 minutes on this patient.   ____________________________ Theodoro Grist, MD rv:cc D: 02/18/2013 13:39:26 ET T: 02/18/2013 15:04:03 ET JOB#: [id number]  cc: Theodoro Grist, MD, <Dictator> Tracie Harrier, MD  [place] MD ELECTRONICALLY SIGNED 02/20/2013 9:01

## 2014-11-29 NOTE — Discharge Summary (Signed)
PATIENT NAME:  Cassandra, Joseph MR#:  [id number] DATE OF BIRTH:  02/11/1946  DATE OF ADMISSION:  03/26/2014 DATE OF DISCHARGE:  03/27/2014  DIAGNOSES AT TIME OF DISCHARGE:  1.  Altered mental status with presyncope following dialysis.  2.  End-stage renal disease.  3.  Hypertension.  4.  Secondary hyperparathyroidism.   CHIEF COMPLAINT: Syncope and altered mental status.   HISTORY OF PRESENT ILLNESS:  Cassandra Joseph is a 69 year old female who presented to the ED after she was "not feeling like myself" after dialysis. The patient states that she had about 30 minutes left in a session and subsequently was seen in the ER where she was noted to be lethargic. The patient was also noted to be hypotensive, somewhat confused and was admitted for observation. Please see H and P for full details.   HOSPITAL COURSE: The patient was admitted to [facility] and underwent a carotid ultrasound which showed mild bilateral carotid bifurcation and proximal ICA plaque resulting in less than 50% diameter stenosis; bilateral thyroid nodules were incompletely evaluated.   CT head without contrast did not show any acute abnormality.   Chest x-ray showed vascular congestion without edema and chronic elevation of the right hemidiaphragm and right lower lobe atelectasis.   The patient was admitted to [facility] and kept for observation. An echocardiogram showed ejection fraction of 55-60% with normal global LV systolic function, impaired relaxation pattern of LV diastolic filling, and severe concentric LVH with moderately dilated left atrium and mild mitral stenosis.   The patient was seen by physical therapy and it was felt that she would benefit from home health nursing and PT. Her troponin was mildly elevated 0.07 and 0.06, respectively. This was felt to be due to her renal failure. The patient's symptoms gradually resolved and she was stable at the time of discharge.   DISCHARGE MEDICATIONS: Amlodipine 10 mg once a day,  Nephro-Vite vitamin B complex with C and folic acid 1 tablet once a day, furosemide 80 mg once a day, Sensipar 50 mg once a day, Renvela 800 mg 4 tablets 3 times a day and 2 tablets with snacks. Aspirin 81 mg once a day, carvedilol 25 mg twice a day, docusate sodium 50 mg/8.6 one tablet once a day, irbesartan 300 mg once a day.   FOLLOWUP: The patient is advised to follow a renal diet and also follow with nephrologist, and follow with me, Dr. Ginette Pitman, in 1-2 weeks' time. She has been advised to call back with any questions or concerns.   Total time spent discharging this patient: 35 minutes.    ____________________________ Tracie Harrier, MD vh:lt D: 03/30/2014 11:51:43 ET T: 03/30/2014 16:02:54 ET JOB#: [id number]  cc: Tracie Harrier, MD, <Dictator> Tracie Harrier MD ELECTRONICALLY SIGNED 04/16/2014 13:45

## 2014-11-29 NOTE — H&P (Signed)
PATIENT NAME:  Cassandra Joseph, Cassandra Joseph MR#:  [id number] DATE OF BIRTH:  [birth year]  DATE OF ADMISSION:  03/26/2014  PRIMARY CARE PHYSICIAN: Tracie Harrier, MD.  CHIEF COMPLAINT: Syncope, altered mental status.   HISTORY OF PRESENT ILLNESS: This is a 69 year old female who presents to the Emergency Room from dialysis due to a syncopal/presyncopal episode. The patient herself felt like she was more lethargic and not feeling like herself towards the end of dialysis. She only had 30 minutes left in her session. She was sent over to the ER for further evaluation. The patient denied any chest pain, palpitations, nausea, vomiting, shortness of breath, or any other associated symptoms. When the patient presented to the Emergency Room via triage, she was not noted to be hypotensive, although she was somewhat confused and lethargic. Hospitalist services were contacted for further treatment and evaluation.   REVIEW OF SYSTEMS CONSTITUTIONAL: No documented fever. No weight gain, no weight loss.  EYES: No blurred or double vision.  EARS, NOSE AND THROAT: No tinnitus. No postnasal drip. No redness of the oropharynx.  RESPIRATORY: No cough, no wheeze, no hemoptysis, no dyspnea.  CARDIOVASCULAR: No chest pain, no orthopnea, no palpitations, positive presyncope.  GASTROINTESTINAL: No nausea. No vomiting, diarrhea, or abdominal pain. No melena or hematochezia.  GENITOURINARY: No dysuria and hematuria.  ENDOCRINE: No polyuria or nocturia. No heat or cold intolerance.  HEMATOLOGIC: No anemia, no bruising, no bleeding.  INTEGUMENTARY: No rashes. No lesions.  MUSCULOSKELETAL: No soreness, no swelling, no gout.  NEUROLOGIC: No numbness, tingling. No ataxia. No seizure activity.  PSYCHIATRIC: No anxiety, no insomnia. No ADD.   PAST MEDICAL HISTORY: Consistent with end-stage renal disease, on hemodialysis; hypertension; secondary hyperparathyroidism.   ALLERGIES: No known drug allergies.   SOCIAL HISTORY: Used to be a  smoker, smoked about 15-20 years, a pack per day. Currently not smoking. Occasional alcohol use. No illicit drug abuse.   FAMILY HISTORY: Mother and father both deceased. Mother died from throat cancer. She cannot recall what the father died from.   CURRENT MEDICATIONS: As follows: Amlodipine 10 mg daily; aspirin 81 mg daily; Coreg 25 mg b.i.d.; Colace/Senokot 50/8.6, 1 tablet at bedtime; Lasix 80 mg daily, irbesartan 300 mg daily; Nephro-Vite 1 tablet daily; Renvela 800 mg 4 tablets t.i.d. with meals and 2 tablets with snacks; Sensipar 60 mg daily.   PHYSICAL EXAMINATION: Presently is as follows. VITAL SIGNS: Noted to be temperature 97.5, pulse 72, respirations 16, blood pressure 180/87. Saturations 99% on room air.  GENERAL: She is a lethargic-appearing female but in no apparent distress.  HEAD, EYES, EARS, NOSE AND THROAT: Atraumatic, normocephalic. Extraocular muscles are intact. Pupils equal and reactive to light. Sclerae anicteric. No conjunctival injection. No pharyngeal erythema.  NECK: Supple. There is no jugular venous distention. No bruits, no lymphadenopathy, no thyromegaly.  HEART: Regular rate and rhythm. No murmurs, no rubs, no clicks.  LUNGS: Clear to auscultation bilaterally. No rales, rhonchi, no wheezes.  ABDOMEN: Soft, flat, nontender, nondistended. Has good bowel sounds. No hepatosplenomegaly appreciated.  EXTREMITIES: No evidence of any cyanosis, clubbing, or peripheral edema. Has +2 pedal and radial pulses bilaterally.  NEUROLOGICAL: The patient is alert, awake, and oriented x2, globally weak. No other focal motor or sensory deficits bilaterally.  SKIN: Moist and warm with no rashes appreciated.  LYMPHATIC: There is no cervical or cervical or axillary lymphadenopathy.   LABORATORY DATA: Showed serum glucose of 76, BUN 19, creatinine 5.17. Sodium 138, potassium 3.8, chloride 98, and bicarbonate 31. The patient's LFTs  are within normal limits. Troponin 0.07. White cell count  7.3, hemoglobin 10.2, hematocrit 32.5, platelet count 261,000. INR is 1.1.   The patient did have a chest x-ray done which showed no evidence of any acute cardiopulmonary disease.   A CT of the head showing no acute abnormality.   ASSESSMENT AND PLAN: This is a 69 year old female with history of hypertension and endstage renal disease, on hemodialysis; secondary hyperparathyroidism. Presents to the hospital from dialysis due to presyncopal episode and altered mental status.   PROBLEM LIST: 1. Altered mental status/presyncope. The exact etiology of this is unclear. The patient's CT head is negative. There is no focal infectious etiology as a chest x-ray is negative. She has no white count. She is afebrile. For now, I will observe her on telemetry, follow serial cardiac markers, get q. 4 hour neurology checks. Get a 2-dimensional echocardiogram and a carotid duplex. Check orthostatic vital signs.  2. End-stage renal disease, on hemodialysis. Continue dialysis on Monday, Wednesday, and Friday. If the patient is still here on Friday, consult nephrology.  3. Hypertension. She is currently quite hypertensive. I will continue her Norvasc, Coreg and irbesartan. 4. Secondary hyperparathyroidism. Continue Sensipar and Renvela.   CODE STATUS: The patient is a full code.   TIME SPENT ON THIS ADMISSION: 45 minutes    ____________________________ Belia Heman. Verdell Carmine, MD vjs:ls D: 03/26/2014 13:42:00 ET T: 03/26/2014 14:30:17 ET JOB#: [id number]  cc: Belia Heman. Verdell Carmine, MD, <Dictator> Henreitta Leber MD ELECTRONICALLY SIGNED 04/05/2014 11:46

## 2015-10-08 ENCOUNTER — Other Ambulatory Visit: Payer: Self-pay | Admitting: Internal Medicine

## 2015-10-08 DIAGNOSIS — Z1231 Encounter for screening mammogram for malignant neoplasm of breast: Secondary | ICD-10-CM

## 2015-10-16 ENCOUNTER — Ambulatory Visit: Payer: Medicare Other

## 2015-10-27 ENCOUNTER — Ambulatory Visit: Payer: Medicare Other | Attending: Internal Medicine

## 2016-03-03 ENCOUNTER — Ambulatory Visit
Admission: RE | Admit: 2016-03-03 | Discharge: 2016-03-03 | Disposition: A | Discharge status: Home / Self Care | Payer: Medicare Other | Source: Ambulatory Visit | Attending: Internal Medicine | Admitting: Internal Medicine

## 2016-03-03 ENCOUNTER — Other Ambulatory Visit: Payer: Self-pay | Admitting: Internal Medicine

## 2016-03-03 DIAGNOSIS — Z1231 Encounter for screening mammogram for malignant neoplasm of breast: Secondary | ICD-10-CM | POA: Diagnosis not present

## 2016-11-09 ENCOUNTER — Emergency Department: Payer: Medicare Other

## 2016-11-09 ENCOUNTER — Observation Stay
Admission: EM | Admit: 2016-11-09 | Discharge: 2016-11-10 | Disposition: A | Discharge status: Home / Self Care | Payer: Medicare Other | Attending: Internal Medicine | Admitting: Internal Medicine

## 2016-11-09 ENCOUNTER — Observation Stay (HOSPITAL_BASED_OUTPATIENT_CLINIC_OR_DEPARTMENT_OTHER)
Admit: 2016-11-09 | Discharge: 2016-11-09 | Disposition: A | Discharge status: Home / Self Care | Payer: Medicare Other | Attending: Internal Medicine | Admitting: Internal Medicine

## 2016-11-09 ENCOUNTER — Encounter: Payer: Self-pay | Admitting: *Deleted

## 2016-11-09 DIAGNOSIS — R748 Abnormal levels of other serum enzymes: Secondary | ICD-10-CM | POA: Diagnosis not present

## 2016-11-09 DIAGNOSIS — Z992 Dependence on renal dialysis: Secondary | ICD-10-CM | POA: Diagnosis not present

## 2016-11-09 DIAGNOSIS — Z79899 Other long term (current) drug therapy: Secondary | ICD-10-CM | POA: Diagnosis not present

## 2016-11-09 DIAGNOSIS — C9001 Multiple myeloma in remission: Secondary | ICD-10-CM | POA: Diagnosis not present

## 2016-11-09 DIAGNOSIS — Z6841 Body Mass Index (BMI) 40.0 and over, adult: Secondary | ICD-10-CM | POA: Diagnosis not present

## 2016-11-09 DIAGNOSIS — N186 End stage renal disease: Secondary | ICD-10-CM

## 2016-11-09 DIAGNOSIS — Z90710 Acquired absence of both cervix and uterus: Secondary | ICD-10-CM | POA: Diagnosis not present

## 2016-11-09 DIAGNOSIS — M069 Rheumatoid arthritis, unspecified: Secondary | ICD-10-CM | POA: Insufficient documentation

## 2016-11-09 DIAGNOSIS — Z90722 Acquired absence of ovaries, bilateral: Secondary | ICD-10-CM | POA: Diagnosis not present

## 2016-11-09 DIAGNOSIS — Z87891 Personal history of nicotine dependence: Secondary | ICD-10-CM | POA: Insufficient documentation

## 2016-11-09 DIAGNOSIS — Z91013 Allergy to seafood: Secondary | ICD-10-CM | POA: Insufficient documentation

## 2016-11-09 DIAGNOSIS — E785 Hyperlipidemia, unspecified: Secondary | ICD-10-CM | POA: Insufficient documentation

## 2016-11-09 DIAGNOSIS — I208 Other forms of angina pectoris: Secondary | ICD-10-CM | POA: Diagnosis not present

## 2016-11-09 DIAGNOSIS — I120 Hypertensive chronic kidney disease with stage 5 chronic kidney disease or end stage renal disease: Secondary | ICD-10-CM | POA: Insufficient documentation

## 2016-11-09 DIAGNOSIS — I10 Essential (primary) hypertension: Secondary | ICD-10-CM | POA: Diagnosis present

## 2016-11-09 DIAGNOSIS — R7989 Other specified abnormal findings of blood chemistry: Secondary | ICD-10-CM

## 2016-11-09 DIAGNOSIS — Z8673 Personal history of transient ischemic attack (TIA), and cerebral infarction without residual deficits: Secondary | ICD-10-CM | POA: Diagnosis not present

## 2016-11-09 DIAGNOSIS — R778 Other specified abnormalities of plasma proteins: Secondary | ICD-10-CM | POA: Diagnosis present

## 2016-11-09 DIAGNOSIS — R079 Chest pain, unspecified: Secondary | ICD-10-CM | POA: Diagnosis not present

## 2016-11-09 DIAGNOSIS — I214 Non-ST elevation (NSTEMI) myocardial infarction: Secondary | ICD-10-CM | POA: Diagnosis present

## 2016-11-09 DIAGNOSIS — Z7982 Long term (current) use of aspirin: Secondary | ICD-10-CM | POA: Diagnosis not present

## 2016-11-09 DIAGNOSIS — G4733 Obstructive sleep apnea (adult) (pediatric): Secondary | ICD-10-CM | POA: Diagnosis not present

## 2016-11-09 DIAGNOSIS — E8881 Metabolic syndrome: Secondary | ICD-10-CM

## 2016-11-09 DIAGNOSIS — C9000 Multiple myeloma not having achieved remission: Secondary | ICD-10-CM | POA: Diagnosis present

## 2016-11-09 DIAGNOSIS — R0789 Other chest pain: Secondary | ICD-10-CM | POA: Diagnosis present

## 2016-11-09 HISTORY — DX: Cerebral infarction, unspecified: I63.9

## 2016-11-09 HISTORY — DX: Benign neoplasm of unspecified ovary: D27.9

## 2016-11-09 HISTORY — DX: Metabolic syndrome: E88.810

## 2016-11-09 HISTORY — DX: Multiple myeloma not having achieved remission: C90.00

## 2016-11-09 HISTORY — DX: Morbid (severe) obesity due to excess calories: E66.01

## 2016-11-09 HISTORY — DX: Rheumatoid arthritis, unspecified: M06.9

## 2016-11-09 HISTORY — DX: Transient cerebral ischemic attack, unspecified: G45.9

## 2016-11-09 HISTORY — DX: Osteomyelitis, unspecified: M86.9

## 2016-11-09 HISTORY — DX: Personal history of other medical treatment: Z92.89

## 2016-11-09 HISTORY — DX: End stage renal disease: N18.6

## 2016-11-09 HISTORY — DX: Hyperlipidemia, unspecified: E78.5

## 2016-11-09 HISTORY — DX: Essential (primary) hypertension: I10

## 2016-11-09 HISTORY — DX: Metabolic syndrome: E88.81

## 2016-11-09 LAB — BASIC METABOLIC PANEL
ANION GAP: 13 (ref 5–15)
BUN: 22 mg/dL — AB (ref 6–20)
CALCIUM: 9.2 mg/dL (ref 8.9–10.3)
CO2: 31 mmol/L (ref 22–32)
Chloride: 96 mmol/L — ABNORMAL LOW (ref 101–111)
Creatinine, Ser: 4.9 mg/dL — ABNORMAL HIGH (ref 0.44–1.00)
GFR calc Af Amer: 10 mL/min — ABNORMAL LOW (ref >60)
GFR, EST NON AFRICAN AMERICAN: 8 mL/min — AB (ref >60)
GLUCOSE: 79 mg/dL (ref 65–99)
Potassium: 3.7 mmol/L (ref 3.5–5.1)
SODIUM: 140 mmol/L (ref 135–145)

## 2016-11-09 LAB — CBC
HCT: 33.3 % — ABNORMAL LOW (ref 35.0–47.0)
HEMOGLOBIN: 10.9 g/dL — AB (ref 12.0–16.0)
MCH: 29.8 pg (ref 26.0–34.0)
MCHC: 32.8 g/dL (ref 32.0–36.0)
MCV: 91 fL (ref 80.0–100.0)
Platelets: 286 10*3/uL (ref 150–440)
RBC: 3.66 MIL/uL — ABNORMAL LOW (ref 3.80–5.20)
RDW: 17.2 % — AB (ref 11.5–14.5)
WBC: 6.5 10*3/uL (ref 3.6–11.0)

## 2016-11-09 LAB — LIPID PANEL
CHOL/HDL RATIO: 4.3 ratio
CHOLESTEROL: 211 mg/dL — AB (ref 0–200)
HDL: 49 mg/dL (ref >40)
LDL CALC: 146 mg/dL — AB (ref 0–99)
Triglycerides: 81 mg/dL (ref <150)
VLDL: 16 mg/dL (ref 0–40)

## 2016-11-09 LAB — TROPONIN I
TROPONIN I: 0.08 ng/mL — AB (ref <0.03)
TROPONIN I: 0.06 ng/mL — AB (ref <0.03)
Troponin I: 0.07 ng/mL (ref <0.03)

## 2016-11-09 MED ORDER — MELOXICAM 7.5 MG PO TABS
7.5000 mg | ORAL_TABLET | Freq: Every day | ORAL | Status: DC
  Administered 2016-11-09 – 2016-11-10 (×2): 7.5 mg via ORAL
  Filled 2016-11-09 (×2): qty 1

## 2016-11-09 MED ORDER — NITROGLYCERIN 2 % TD OINT
1.0000 [in_us] | TOPICAL_OINTMENT | Freq: Once | TRANSDERMAL | Status: AC
  Administered 2016-11-09: 1 [in_us] via TOPICAL
  Filled 2016-11-09: qty 1

## 2016-11-09 MED ORDER — ATORVASTATIN CALCIUM 20 MG PO TABS
40.0000 mg | ORAL_TABLET | Freq: Every day | ORAL | Status: DC
  Administered 2016-11-09 – 2016-11-10 (×2): 40 mg via ORAL
  Filled 2016-11-09 (×2): qty 2

## 2016-11-09 MED ORDER — CARVEDILOL 25 MG PO TABS
25.0000 mg | ORAL_TABLET | Freq: Two times a day (BID) | ORAL | Status: DC
  Administered 2016-11-09 – 2016-11-10 (×2): 25 mg via ORAL
  Filled 2016-11-09 (×2): qty 1

## 2016-11-09 MED ORDER — ACETAMINOPHEN 325 MG PO TABS
650.0000 mg | ORAL_TABLET | Freq: Four times a day (QID) | ORAL | Status: DC | PRN
  Administered 2016-11-10: 650 mg via ORAL
  Filled 2016-11-09: qty 2

## 2016-11-09 MED ORDER — HEPARIN SODIUM (PORCINE) 5000 UNIT/ML IJ SOLN
5000.0000 [IU] | Freq: Three times a day (TID) | INTRAMUSCULAR | Status: DC
  Administered 2016-11-09 – 2016-11-10 (×3): 5000 [IU] via SUBCUTANEOUS
  Filled 2016-11-09 (×3): qty 1

## 2016-11-09 MED ORDER — RENA-VITE PO TABS
1.0000 | ORAL_TABLET | Freq: Every day | ORAL | Status: DC
  Administered 2016-11-09: 1 via ORAL
  Filled 2016-11-09: qty 1

## 2016-11-09 MED ORDER — MIRTAZAPINE 15 MG PO TABS
15.0000 mg | ORAL_TABLET | Freq: Every day | ORAL | Status: DC
  Administered 2016-11-09: 15 mg via ORAL
  Filled 2016-11-09: qty 1

## 2016-11-09 MED ORDER — SODIUM CHLORIDE 0.9% FLUSH
3.0000 mL | Freq: Two times a day (BID) | INTRAVENOUS | Status: DC
  Administered 2016-11-09: 3 mL via INTRAVENOUS

## 2016-11-09 MED ORDER — ACETAMINOPHEN 650 MG RE SUPP: 650.0000 mg | Freq: Four times a day (QID) | RECTAL | Status: DC | PRN

## 2016-11-09 MED ORDER — IRBESARTAN 150 MG PO TABS
300.0000 mg | ORAL_TABLET | Freq: Every day | ORAL | Status: DC
  Administered 2016-11-09 – 2016-11-10 (×2): 300 mg via ORAL
  Filled 2016-11-09 (×2): qty 2

## 2016-11-09 MED ORDER — SEVELAMER CARBONATE 800 MG PO TABS
800.0000 mg | ORAL_TABLET | Freq: Three times a day (TID) | ORAL | Status: DC
  Administered 2016-11-09 – 2016-11-10 (×3): 800 mg via ORAL
  Filled 2016-11-09 (×3): qty 1

## 2016-11-09 MED ORDER — IOPAMIDOL (ISOVUE-370) INJECTION 76%
75.0000 mL | Freq: Once | INTRAVENOUS | Status: AC | PRN
  Administered 2016-11-09: 75 mL via INTRAVENOUS

## 2016-11-09 MED ORDER — AMLODIPINE BESYLATE 5 MG PO TABS
10.0000 mg | ORAL_TABLET | Freq: Every day | ORAL | Status: DC
  Administered 2016-11-09 – 2016-11-10 (×2): 10 mg via ORAL
  Filled 2016-11-09 (×2): qty 2

## 2016-11-09 MED ORDER — ONDANSETRON HCL 4 MG/2ML IJ SOLN: 4.0000 mg | Freq: Four times a day (QID) | INTRAMUSCULAR | Status: DC | PRN

## 2016-11-09 MED ORDER — ASPIRIN EC 81 MG PO TBEC
81.0000 mg | DELAYED_RELEASE_TABLET | Freq: Every day | ORAL | Status: DC
  Administered 2016-11-10: 81 mg via ORAL
  Filled 2016-11-09: qty 1

## 2016-11-09 MED ORDER — ASPIRIN 81 MG PO CHEW
324.0000 mg | CHEWABLE_TABLET | Freq: Once | ORAL | Status: AC
  Administered 2016-11-09: 324 mg via ORAL
  Filled 2016-11-09: qty 4

## 2016-11-09 MED ORDER — NITROGLYCERIN 2 % TD OINT
0.5000 [in_us] | TOPICAL_OINTMENT | Freq: Four times a day (QID) | TRANSDERMAL | Status: DC
  Administered 2016-11-09 – 2016-11-10 (×4): 0.5 [in_us] via TOPICAL
  Filled 2016-11-09 (×4): qty 1

## 2016-11-09 MED ORDER — CINACALCET HCL 30 MG PO TABS
60.0000 mg | ORAL_TABLET | Freq: Two times a day (BID) | ORAL | Status: DC
  Administered 2016-11-09 – 2016-11-10 (×2): 60 mg via ORAL
  Filled 2016-11-09 (×2): qty 2

## 2016-11-09 MED ORDER — LORATADINE 10 MG PO TABS
10.0000 mg | ORAL_TABLET | Freq: Every day | ORAL | Status: DC
  Administered 2016-11-09 – 2016-11-10 (×2): 10 mg via ORAL
  Filled 2016-11-09 (×2): qty 1

## 2016-11-09 MED ORDER — ONDANSETRON HCL 4 MG PO TABS: 4.0000 mg | ORAL_TABLET | Freq: Four times a day (QID) | ORAL | Status: DC | PRN

## 2016-11-09 NOTE — Plan of Care (Signed)
Problem: Fluid Volume: Goal: Compliance with measures to maintain balanced fluid volume will improve Hemodialysis  MWF  R arm fistula

## 2016-11-09 NOTE — ED Provider Notes (Signed)
[facility] Emergency Department Provider Note  ____________________________________________  Time seen: Approximately 12:10 PM  I have reviewed the triage vital signs and the nursing notes.   HISTORY  Chief Complaint Chest Pain   HPI Cassandra Joseph is a 71 y.o. female the history of multiple myeloma in remission, ESRD on HD (MWF), and hypertension who presents for evaluation of chest pain. Patient reports multiple episodes of chest pain over the course of a month worse over the course of the last week. She describes episodes and located in the left side of her chest, pressure/indigestion in quality, associated with the dizziness, nausea, and shortness of breath. These episodes last hours at a time. They're worse at night time however she had a more severe episode this morning while in dialysis. She received 3.5 hours of dialysis treatment out of her 4 hours total when she started having one episode of chest pain that she describes as 9 out of 10, pressure, located in her left chest, nonradiating, associated with dizziness and nausea. Patient currently reports that her pain is a 2 out of 10 pressure like, on her left chest, and is worse with deep inspiration. She denies personal or family history of ischemic heart disease. She has a remote history of smoking and stopped 11 years ago. She has never seen a cardiologist or undergone a stress test. She denies vomiting, diarrhea, abdominal pain. She does endorse intermittent bilateral lower extremity cramping that is more pronounced on the right lower extremity. No personal or family history of blood clots, no recent travel or immobilization, no leg pain or swelling, no hemoptysis, no hormones.  Past Medical History:  Diagnosis Date  . Cancer ([place])    Multiple Myloma  . Dialysis patient ([place])   . Hypertension   . Multiple myeloma ([place])   . Renal disorder     There are no active problems to display for this  patient.   Past Surgical History:  Procedure Laterality Date  . ABDOMINAL HYSTERECTOMY      Prior to Admission medications   Medication Sig Start Date End Date Taking? Authorizing Provider  amLODipine (NORVASC) 10 MG tablet Take 10 mg by mouth daily. 09/27/16  Yes Historical Provider, MD  aspirin EC 81 MG tablet Take 81 mg by mouth daily.   Yes Historical Provider, MD  atorvastatin (LIPITOR) 40 MG tablet Take 40 mg by mouth daily. 03/31/14  Yes Historical Provider, MD  b complex-vitamin c-folic acid (NEPHRO-VITE) 0.8 MG TABS tablet Take 1 tablet by mouth daily. 09/27/16  Yes Historical Provider, MD  carvedilol (COREG) 25 MG tablet Take 25 mg by mouth 2 (two) times daily. 06/26/13  Yes Historical Provider, MD  irbesartan (AVAPRO) 300 MG tablet Take 300 mg by mouth daily. 09/16/16 09/16/17 Yes Historical Provider, MD  loratadine (CLARITIN) 10 MG tablet Take 10 mg by mouth daily. 09/27/16  Yes Historical Provider, MD  meloxicam (MOBIC) 7.5 MG tablet Take 7.5 mg by mouth daily. 10/11/16  Yes Historical Provider, MD  mirtazapine (REMERON) 15 MG tablet Take 15 mg by mouth at bedtime. 10/11/16  Yes Historical Provider, MD    Allergies Shellfish allergy  Family History  Problem Relation Age of Onset  . Breast cancer Neg Hx     Social History Social History  Substance Use Topics  . Smoking status: Never Smoker  . Smokeless tobacco: Never Used  . Alcohol use No    Review of Systems  Constitutional: Negative for fever. + lighthedeness Eyes: Negative  for visual changes. ENT: Negative for sore throat. Neck: No neck pain  Cardiovascular: + chest pain. Respiratory: Negative for shortness of breath. Gastrointestinal: Negative for abdominal pain, vomiting or diarrhea. Genitourinary: Negative for dysuria. Musculoskeletal: Negative for back pain. Skin: Negative for rash. Neurological: Negative for headaches, weakness or numbness. Psych: No SI or  HI  ____________________________________________   PHYSICAL EXAM:  VITAL SIGNS: ED Triage Vitals  Enc Vitals Group     BP 11/09/16 1008 (!) 155/68     Pulse Rate 11/09/16 1008 70     Resp 11/09/16 1008 18     Temp 11/09/16 1008 97.6 F (36.4 C)     Temp Source 11/09/16 1008 Oral     SpO2 11/09/16 1008 93 %     Weight 11/09/16 1009 271 lb 2.7 oz (123 kg)     Height 11/09/16 1009 _0  (1.676 m)     Head Circumference --      Peak Flow --      Pain Score 11/09/16 1008 6     Pain Loc --      Pain Edu? --      Excl. in [place]? --     Constitutional: Alert and oriented. Well appearing and in no apparent distress. HEENT:      Head: Normocephalic and atraumatic.         Eyes: Conjunctivae are normal. Sclera is non-icteric. EOMI. PERRL      Mouth/Throat: Mucous membranes are moist.       Neck: Supple with no signs of meningismus. Cardiovascular: Regular rate and rhythm. No murmurs, gallops, or rubs. 2+ symmetrical distal pulses are present in all extremities. No JVD. Respiratory: Normal respiratory effort. Lungs are clear to auscultation bilaterally. No wheezes, crackles, or rhonchi.  Gastrointestinal: Soft, non tender, and non distended with positive bowel sounds. No rebound or guarding. Genitourinary: No CVA tenderness. Musculoskeletal: Trace edema on b/l LE Neurologic: Normal speech and language. Face is symmetric. Moving all extremities. No gross focal neurologic deficits are appreciated. Skin: Skin is warm, dry and intact. No rash noted. Psychiatric: Mood and affect are normal. Speech and behavior are normal.  ____________________________________________   LABS (all labs ordered are listed, but only abnormal results are displayed)  Labs Reviewed  BASIC METABOLIC PANEL - Abnormal; Notable for the following:       Result Value   Chloride 96 (*)    BUN 22 (*)    Creatinine, Ser 4.90 (*)    GFR calc non Af Amer 8 (*)    GFR calc Af Amer 10 (*)    All other components  within normal limits  CBC - Abnormal; Notable for the following:    RBC 3.66 (*)    Hemoglobin 10.9 (*)    HCT 33.3 (*)    RDW 17.2 (*)    All other components within normal limits  TROPONIN I - Abnormal; Notable for the following:    Troponin I 0.08 (*)    All other components within normal limits   ____________________________________________  EKG  ED ECG REPORT I, Rudene Re, the attending physician, personally viewed and interpreted this ECG.  Normal sinus rhythm, rate of 71, normal PR and QRS intervals, prolonged QTC, normal axis, no ST elevations or depressions.  12:12 - normal sinus rhythm, rate of 71, normal PR and QRS intervals, prolonged QTC, normal axis, no ST elevations or depressions. Unchanged from prior EKG. ____________________________________________  RADIOLOGY  CXR: Negative  CTA chest: 1. No pulmonary emboli. 2.  Three tiny patchy areas of haziness in the left upper lobe, probably inflammatory in origin. Initial follow-up with CT at 6-12 months is recommended to confirm persistence. If persistent, repeat CT is recommended every 2 years until 5 years of stability has been established. This recommendation follows the consensus statement: Guidelines for Management of Incidental Pulmonary Nodules Detected on CT Images: From the Fleischner Society 2017; Radiology 2017; 284:228-243. 3. Extensive aortic atherosclerosis. 4. Coronary artery atherosclerosis. 5. Chronic elevation of the right hemidiaphragm with chronic atelectasis in the right lung.  Doppler US: No evidence of deep venous thrombosis in either extremity ____________________________________________   PROCEDURES  Procedure(s) performed: None Procedures Critical Care performed: yes  CRITICAL CARE Performed by: Rudene Re  ?  Total critical care time: 35 min  Critical care time was exclusive of separately billable procedures and treating other patients.  Critical care was  necessary to treat or prevent imminent or life-threatening deterioration.  Critical care was time spent personally by me on the following activities: development of treatment plan with patient and/or surrogate as well as nursing, discussions with consultants, evaluation of patient's response to treatment, examination of patient, obtaining history from patient or surrogate, ordering and performing treatments and interventions, ordering and review of laboratory studies, ordering and review of radiographic studies, pulse oximetry and re-evaluation of patient's condition.  ____________________________________________   INITIAL IMPRESSION / ASSESSMENT AND PLAN / ED COURSE  71 y.o. female the history of multiple myeloma in remission, ESRD on HD (MWF), and hypertension who presents for evaluation of multiple episodes of chest pressure and a more severe one in HD today. EKG with no ischemic changes. Presentation concerning for ACS in this high risk patient. Will give ASA and nitro. Will repeat EKG. First troponin elevated at 0.08. Will pursue CTA to rule out PE since patient is also complaining of RLE pain and tells me that pain is now pleuritic this morning.   Clinical Course as of Nov 09 1413  Wed Nov 09, 2016  1413 Pain resolved with nitro paste. CTA with no evidence of PE. DVT studies negative. Patient remains CP free. CT consistent with significant CAD. Patient will be admitted to the [facility] service for further management.  [CV]    Clinical Course User Index [CV] Rudene Re, MD    Pertinent labs & imaging results that were available during my care of the patient were reviewed by me and considered in my medical decision making (see chart for details).    ____________________________________________   FINAL CLINICAL IMPRESSION(S) / ED DIAGNOSES  Final diagnoses:  NSTEMI (non-ST elevated myocardial infarction) ([place])      NEW MEDICATIONS STARTED DURING THIS VISIT:  New  Prescriptions   No medications on file     Note:  This document was prepared using Dragon voice recognition software and may include unintentional dictation errors.    Rudene Re, MD 11/09/16 1415

## 2016-11-09 NOTE — ED Notes (Signed)
Patient transported to CT 

## 2016-11-09 NOTE — ED Triage Notes (Signed)
States chest pain for 1 week, sore to touch, states she was at dialysis today in the last 30 mins of her tx and complained of chest pain, states she recently had pneumonia and is still coughing, states pain worsens with deep breathes, awake and alert in no acute distress

## 2016-11-09 NOTE — Consult Note (Signed)
Cardiology Consultation Note  Patient ID: Cassandra Joseph, MRN: [id number], DOB/AGE: 70/21/1947 70 y.o. Admit date: 11/09/2016   Date of Consult: 11/09/2016 Primary Physician: Tracie Harrier, MD Primary Cardiologist: New to [facility]  - consult by End (previously seen at [facility] Cardiology) Requesting Physician: Dr. Tressia Miners, MD  Chief Complaint: Chest pain Reason for Consult: Same  HPI: Cassandra Joseph is a 71 y.o. female who is being seen today for the evaluation of chest pain at the request of Dr. Tressia Miners, MD. 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   Patient has been followed by [facility] cardiology since November 2015. 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 At that time, patient was unable to ambulate greater than 4 METs, so it was recommended the patient undergo nuclear stress testing for further risk stratification. 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 At that time, patient was referred back to [facility] cardiology for further cardiac preoperative evaluation. She was feeling well at that time. Though, was unable  to ambulate greater than 4 METs and underwent PET nuclear stress testing that showed coronary calcifications as well as marked mitral annular calcifications, but no evidence of significant ischemia or scar. Based on those results, patient was cleared at low to intermediate cardiac risk for an intermediate risk surgical procedure. No further cardiac evaluation was recommended at that time as it would not mitigate her preoperative risk. Patient ultimately underwent successful fifth toe amputation as above. It does not appear the patient has followed up with cardiology since. 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   Patient presented to [facility] on 4/4 with a several-month history of intermittent chest pain along the left side of her chest underneath her breast tissue. Pain is almost exclusively during the nighttime, waking patient up from sleep. Pain will last for several hours, improved with multiple episodes of belching, though not resolve with belching, resolves on its own. Pain ultimately resolved on its own the patient is unable to further quantify duration. Pain is characterized as pressure like along the left anterior chest as above without radiation. Chest pain is worse with deep inspiration. These episodes have been associated with indigestion, shortness of breath, nausea, and dizziness. She denies any increase in frequency or change in quality of her pain. Patient did note her typical chest pain as above occurring overnight into 4/4. She decided to get up and go to work as previously scheduled and did so without issue. Later in the day, patient went to the dialysis center for her regularly scheduled hemodialysis and had already received 3.5 hours of her  dialysis treatment out of a total of 4 planned when she developed an episode of chest pain/pressure rated a 9 out of 10 along the left side of her  chest that did not radiate and was associated with dizziness and nausea. This pain felt the same as all her prior episodes and was worse with deep inspiration. Because of her pain she was transported to the [facility] ED for further evaluation.   Never with exertional chest pain.   Upon patient's arrival she was noted to have blood pressure of 155/68, heart rate 70 bpm, respirations 18/m, temperature 97.6F, oxygen saturation is 93% on room air, weight 271 pounds which upon reviewing prior office visits through [facility] and [facility] appears to be her approximate baseline. Initial EKG at 10:11 showed NSR, 71 bpm, prolonged QTc at 508 msec, no acute ST-T changes. Follow-up EKG at 12:12 showed NSR, 71 bpm, LVH, prolonged QTc at 504 msec, nonspecific ST-T changes. Labs showed an initial troponin of 0.08 (baseline troponin appears to be approximately 0.05-0.07 in the setting of her end-stage renal disease upon reviewing labs from 2015), serum creatinine 4.90, potassium 3.7, white blood cell count 6.5, hemoglobin 10.9, platelet count 286. Chest x-ray showed poor inspiratory effort with low lung volumes with bibasilar atelectasis. Lower extremity ultrasound was negative for DVT along bilateral lower extremities. CTA chest PE protocol was negative for pulmonary emboli. Incidentally, there were 3 tiny patchy areas of haziness in the left upper lobe felt to be probable inflammatory in origin with recommended follow-up dedicated imaging per radiology, extensive aortic atherosclerosis, coronary artery atherosclerosis, and chronic elevation of the right hemidiaphragm with chronic atelectasis along the right lung. In the ED patient's vitals remained stable. She was given ASA 324 mg 1 and nitro paste was applied to her skin. At the time the ED physician saw the patient her pain had improved to 2 out of 10. Nuclear stress test has been ordered by internal medicine. Upon cardiology is evaluating the patient, she  was chest pain free. Cardiology was asked to further evaluate.    Past Medical History:  Diagnosis Date  . 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 ESRD (end stage renal disease) (HCC)    a. on HD x 11 years; b. felt to be 2/2 HTN; c. HD TTS  . History of nuclear stress test    a. 08/2015: probably normal myocardial perfusion study, no evidence for significant ischemia or scar was noted, during stress, global systolic function normal, EF 54%, RV borderline dilatation, coronary artery calcifications and marked mitral annular calcifications were noted, liver calcifications were noted on attenuation CT scan likely represent sequelae of previous granulomatous disease  . HLD (hyperlipidemia)   . Hypertension   . Ischemic stroke (HCC)    a. 03/2014  . Metabolic syndrome   . Morbid obesity (HCC)   . Multiple myeloma (HCC)    a. in remission  . Osteomyelitis (HCC)    a. 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 Rheumatoid arthritis (HCC)   . TIA (transient ischemic attack)    a. 2014      Most Recent Cardiac Studies: PET stress test (regadenosen) 08/20/2015: Impressions: - Probably normal myocardial perfusion study - No evidence for significant ischemia or scar is noted. - During stress: Global systolic function is normal. The ejection fraction  calculated at 54%. Right ventricular chamber size is borderline dilated. - Coronary calcifications and marked mitral annular calcifications are  noted - Liver   calcifications are noted on the attenuation CT scan likely  representing sequelae of previous granulomatous disease - Sensitivity and specificity of this test are reduced by the noted  increased BMI   Surgical History:  Past Surgical History:  Procedure Laterality Date  . ABDOMINAL HYSTERECTOMY    . APPENDECTOMY    . eyelid surgery    . HERNIA REPAIR    . right knee surgery       Home Meds: Prior to Admission medications   Medication Sig Start Date End Date Taking? Authorizing  Provider  amLODipine (NORVASC) 10 MG tablet Take 10 mg by mouth daily. 09/27/16  Yes Historical Provider, MD  aspirin EC 81 MG tablet Take 81 mg by mouth daily.   Yes Historical Provider, MD  atorvastatin (LIPITOR) 40 MG tablet Take 40 mg by mouth daily. 03/31/14  Yes Historical Provider, MD  b complex-vitamin c-folic acid (NEPHRO-VITE) 0.8 MG TABS tablet Take 1 tablet by mouth daily. 09/27/16  Yes Historical Provider, MD  carvedilol (COREG) 25 MG tablet Take 25 mg by mouth 2 (two) times daily. 06/26/13  Yes Historical Provider, MD  cinacalcet (SENSIPAR) 60 MG tablet Take 60 mg by mouth 2 (two) times daily.   Yes Historical Provider, MD  irbesartan (AVAPRO) 300 MG tablet Take 300 mg by mouth daily. 09/16/16 09/16/17 Yes Historical Provider, MD  meloxicam (MOBIC) 7.5 MG tablet Take 7.5 mg by mouth daily. 10/11/16  Yes Historical Provider, MD  mirtazapine (REMERON) 15 MG tablet Take 15 mg by mouth at bedtime. 10/11/16  Yes Historical Provider, MD  sevelamer carbonate (RENVELA) 800 MG tablet Take 800 mg by mouth 4 (four) times daily.   Yes Historical Provider, MD  loratadine (CLARITIN) 10 MG tablet Take 10 mg by mouth daily. 09/27/16   Historical Provider, MD    Inpatient Medications:  . amLODipine  10 mg Oral Daily  . [START ON 11/10/2016] aspirin EC  81 mg Oral Daily  . atorvastatin  40 mg Oral Daily  . carvedilol  25 mg Oral BID WC  . cinacalcet  60 mg Oral BID WC  . heparin  5,000 Units Subcutaneous Q8H  . irbesartan  300 mg Oral Daily  . loratadine  10 mg Oral Daily  . meloxicam  7.5 mg Oral Daily  . mirtazapine  15 mg Oral QHS  . multivitamin  1 tablet Oral QHS  . nitroGLYCERIN  0.5 inch Topical Q6H  . sevelamer carbonate  800 mg Oral TID WC & HS     Allergies:  Allergies  Allergen Reactions  . Shellfish Allergy     Social History   Social History  . Marital status: Married    Spouse name: N/A  . Number of children: N/A  . Years of education: N/A   Occupational History  . Not on  file.   Social History Main Topics  . Smoking status: Former Smoker  . Smokeless tobacco: Never Used     Comment: quit in 2007  . Alcohol use No  . Drug use: Unknown  . Sexual activity: Not on file   Other Topics Concern  . Not on file   Social History Narrative   Independent at baseline, ambulates with a walker     Family History  Problem Relation Age of Onset  . Seizures Mother   . Hypertension Mother   . Throat cancer Father   . Breast cancer Neg Hx      Review of Systems: Review of Systems  Constitutional: Positive for   malaise/fatigue. Negative for chills, diaphoresis, fever and weight loss.  HENT: Negative for congestion.   Eyes: Negative for discharge and redness.  Respiratory: Positive for shortness of breath. Negative for cough, hemoptysis, sputum production and wheezing.   Cardiovascular: Positive for chest pain. Negative for palpitations, orthopnea, claudication, leg swelling and PND.  Gastrointestinal: Positive for heartburn and nausea. Negative for abdominal pain, blood in stool, melena and vomiting.  Genitourinary: Negative for hematuria.  Musculoskeletal: Negative for falls and myalgias.  Skin: Negative for rash.  Neurological: Positive for weakness. Negative for dizziness, tingling, tremors, sensory change, speech change, focal weakness and loss of consciousness.  Endo/Heme/Allergies: Does not bruise/bleed easily.  Psychiatric/Behavioral: Negative for substance abuse. The patient is not nervous/anxious.   All other systems reviewed and are negative.   Labs:  Recent Labs  11/09/16 1006 11/09/16 1503  TROPONINI 0.08* 0.07*   Lab Results  Component Value Date   WBC 6.5 11/09/2016   HGB 10.9 (L) 11/09/2016   HCT 33.3 (L) 11/09/2016   MCV 91.0 11/09/2016   PLT 286 11/09/2016     Recent Labs Lab 11/09/16 1006  NA 140  K 3.7  CL 96*  CO2 31  BUN 22*  CREATININE 4.90*  CALCIUM 9.2  GLUCOSE 79   Lab Results  Component Value Date   CHOL  211 (H) 11/09/2016   HDL 49 11/09/2016   LDLCALC 146 (H) 11/09/2016   TRIG 81 11/09/2016   No results found for: DDIMER  Radiology/Studies:  Dg Chest 2 View  Result Date: 11/09/2016 IMPRESSION: 1. Low lung volumes with bibasilar atelectasis. Electronically Signed   By: Taylor  Stroud M.D.   On: 11/09/2016 10:48   Ct Angio Chest Pe W And/or Wo Contrast  Result Date: 11/09/2016 IMPRESSION: 1. No pulmonary emboli. 2. Three tiny patchy areas of haziness in the left upper lobe, probably inflammatory in origin. Initial follow-up with CT at 6-12 months is recommended to confirm persistence. If persistent, repeat CT is recommended every 2 years until 5 years of stability has been established. This recommendation follows the consensus statement: Guidelines for Management of Incidental Pulmonary Nodules Detected on CT Images: From the Fleischner Society 2017; Radiology 2017; 284:228-243. 3. Extensive aortic atherosclerosis. 4. Coronary artery atherosclerosis. 5. Chronic elevation of the right hemidiaphragm with chronic atelectasis in the right lung. Electronically Signed   By: James  Maxwell M.D.   On: 11/09/2016 14:05   Us Venous Img Lower Bilateral  Result Date: 11/09/2016 IMPRESSION: No evidence of deep venous thrombosis in either extremity. Electronically Signed   By: John T Curnes M.D.   On: 11/09/2016 13:49    EKG: Interpreted by me showed:  Initial EKG at 10:11 showed NSR, 71 bpm, prolonged QTc at 508 msec, no acute ST-T changes. Follow-up EKG at 12:12 showed NSR, 71 bpm, LVH, prolonged QTc at 504 msec, nonspecific ST-T changes Telemetry: Interpreted by me showed: Sinus rhythm  Weights: Filed Weights   11/09/16 1009 11/09/16 1608  Weight: 271 lb 2.7 oz (123 kg) 271 lb 1.6 oz (123 kg)     Physical Exam: Blood pressure (!) 151/74, pulse 77, temperature 98.1 F (36.7 C), temperature source Oral, resp. rate 19, height 5' 6" (1.676 m), weight 271 lb 1.6 oz (123 kg), SpO2 95 %. Body mass index  is 43.76 kg/m. General: Well developed, well nourished, in no acute distress. Head: Normocephalic, atraumatic, sclera non-icteric, no xanthomas, nares are without discharge.  Neck: Negative for carotid bruits. JVD not elevated. Lungs: Clear bilaterally to auscultation   without wheezes, rales, or rhonchi. Breathing is unlabored. Heart: RRR with S1 S2. No murmurs, rubs, or gallops appreciated. Palpation of the left anterior chest wall does not reproduce patient's symptoms. Abdomen: Obese, soft, non-tender, non-distended with normoactive bowel sounds. No hepatomegaly. No rebound/guarding. No obvious abdominal masses. Msk:  Strength and tone appear normal for age. Extremities: No clubbing or cyanosis. No edema. Distal pedal pulses are 2+ and equal bilaterally. Neuro: Alert and oriented X 3. No facial asymmetry. No focal deficit. Moves all extremities spontaneously. Psych:  Responds to questions appropriately with a normal affect.    Assessment and Plan:  Principal Problem:   Atypical chest pain Active Problems:   ESRD on hemodialysis (HCC)   Elevated troponin   Metabolic syndrome   Morbid obesity ([place])   Essential hypertension   Multiple myeloma ([place])    1. Atypical chest pain/elevated troponin:  -Currently, without chest pain  -Prior nuclear stress test in 08/2015 as above without evidence of ischemia  -Initial troponin minimally elevated at 0.08 which is relatively consistent with her chronic troponin anemia in the setting of her end-stage renal disease on hemodialysis  -Recommend continuing to cycle troponin to evaluate for significant elevation in level  -She is certainly at high risk of ACS given her multiple comorbidities including prior tobacco abuse, prior stroke, metabolic syndrome, end-stage renal disease, hypertension, hyperlipidemia, and morbid obesity  -Patient presents a mixed picture of both typical and atypical symptoms and is certainly at high risk of ACS as above  -Pain  worse with deep inspiration and improves with belching -Consider alternative etiologies such as GI/indigestion, pulmonary, or MSK, defer to IM -Consider PPI/GI cocktail, defer to IM -Review of prior troponin level from 2015 demonstrated a mild troponemia with a baseline of approximately 0.05-0.07 in the setting of her end-stage renal disease  -Currently level of 0.08 is approximately at her baseline as above  -If there is no significant rise in troponin level, would not initiate heparin drip. However, if there is significant elevation and subsequent troponin level would initiate heparin drip  -Patient is scheduled for Lexiscan Myoview to evaluate for high-risk ischemia per internal medicine  -Given patient's body habitus there may be some limitations to nuclear stress testing as previously noted during [facility] stress test  -If she continues to be symptomatic in the setting of possible normal nuclear stress test, may need definitive evaluation with cardiac catheterization  -Look to transition from nitropaste to Imdur  -Lipid panel pending  -Continue ASA, carvedilol, atorvastatin, irbesartan  -Further recommendations pending the above stress testing   2. Murmur: -Check echocardiogram   3. ESRD:  -On HD (Tuesday, Thursday, Saturday)  -Per IM/renal   4. History of TIA/stroke:  -Currently asymptomatic  -ASA and Lipitor as above  -Check lipid panel as above   5. Hypertension:  -Improving  -Continue home medications  -Titrate antihypertensives as needed per primary service   6. HLD: -Continue home Lipitor  -Lipid panel pending as above   7. Metabolic syndrome: -Patient with morbid obesity, HDL of 42 by labs drawn on 10/04/2016, and hypertension -Recommend checking hemoglobin A1c for further risk stratification  -Needs close follow-up with PCP/possible endocrinology   8. History of tobacco abuse:  -Quit in 2007   9. Multiple myeloma: -Follows with [facility] oncology   Signed, Christell Faith,  PA-C [place]: [phone] 11/09/2016, 4:16 PM

## 2016-11-09 NOTE — H&P (Signed)
[name] NAME: Cassandra Joseph    MR#:  [id number]  DATE OF BIRTH:  08/02/1946  DATE OF ADMISSION:  11/09/2016  PRIMARY CARE PHYSICIAN: Tracie Harrier, MD   REQUESTING/REFERRING PHYSICIAN: Dr. Gonzella Lex  CHIEF COMPLAINT:   Chief Complaint  Patient presents with  . Chest Pain    HISTORY OF PRESENT ILLNESS:  Cassandra Joseph  is a 71 y.o. 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 For the last month she has been having chest pain at night, waking her up with nausea and diaphoresis and dyspnea, she wakes up and sitting by the edge of the bed and it resolves in an hour or so. This morning she woke up with the chest pain. Went to dialysis and it worsened that she became diaphoretic, nauseous and sent over to ER. No prior h/o chest pain or CAD. Pain resolved with aspirin and nitro here. No prior stress test done. CT of the chest negative for PE. No other complaints of cough, fevers, congestion etc.  PAST MEDICAL HISTORY:   Past Medical History:  Diagnosis Date  . Cancer ([place])    Multiple Myloma  . ESRD (end stage renal disease) ([place])   . Hypertension   . Multiple myeloma ([place])   . Renal disorder   . Rheumatoid arthritis ([place])     PAST SURGICAL HISTORY:   Past Surgical History:  Procedure Laterality Date  . ABDOMINAL HYSTERECTOMY    . APPENDECTOMY    . eyelid surgery    . HERNIA REPAIR    . right knee surgery      SOCIAL HISTORY:   Social History  Substance Use Topics  . Smoking status: Former Research scientist (life sciences)  . Smokeless tobacco: Never Used     Comment: quit in 2007  . Alcohol use No    FAMILY HISTORY:   Family History  Problem Relation Age of Onset  . Seizures Mother   . Hypertension Mother   . Throat cancer Father   . Breast cancer Neg Hx     DRUG ALLERGIES:   Allergies  Allergen Reactions  . Shellfish  Allergy     REVIEW OF SYSTEMS:   Review of Systems  Constitutional: Positive for diaphoresis. Negative for chills, fever, malaise/fatigue and weight loss.  HENT: Negative for ear discharge, ear pain, hearing loss and nosebleeds.   Eyes: Negative for blurred vision, double vision and photophobia.  Respiratory: Negative for cough, hemoptysis, shortness of breath and wheezing.   Cardiovascular: Positive for chest pain. Negative for palpitations, orthopnea and leg swelling.  Gastrointestinal: Positive for nausea. Negative for abdominal pain, constipation, diarrhea, heartburn, melena and vomiting.  Genitourinary: Negative for dysuria, frequency and urgency.  Musculoskeletal: Negative for back pain, myalgias and neck pain.  Skin: Negative for rash.  Neurological: Negative for dizziness, tremors, sensory change, speech change and focal weakness.  Endo/Heme/Allergies: Does not bruise/bleed easily.  Psychiatric/Behavioral: Negative for depression.    MEDICATIONS AT HOME:   Prior to Admission medications   Medication Sig Start Date End Date Taking? Authorizing Provider  amLODipine (NORVASC) 10 MG tablet Take 10 mg by mouth daily. 09/27/16  Yes Historical Provider, MD  aspirin EC 81 MG tablet Take 81 mg by mouth daily.   Yes Historical Provider, MD  atorvastatin (LIPITOR) 40 MG tablet Take 40 mg by mouth daily. 03/31/14  Yes Historical Provider, MD  b complex-vitamin c-folic  acid (NEPHRO-VITE) 0.8 MG TABS tablet Take 1 tablet by mouth daily. 09/27/16  Yes Historical Provider, MD  carvedilol (COREG) 25 MG tablet Take 25 mg by mouth 2 (two) times daily. 06/26/13  Yes Historical Provider, MD  cinacalcet (SENSIPAR) 60 MG tablet Take 60 mg by mouth 2 (two) times daily.   Yes Historical Provider, MD  irbesartan (AVAPRO) 300 MG tablet Take 300 mg by mouth daily. 09/16/16 09/16/17 Yes Historical Provider, MD  meloxicam (MOBIC) 7.5 MG tablet Take 7.5 mg by mouth daily. 10/11/16  Yes Historical Provider, MD    mirtazapine (REMERON) 15 MG tablet Take 15 mg by mouth at bedtime. 10/11/16  Yes Historical Provider, MD  sevelamer carbonate (RENVELA) 800 MG tablet Take 800 mg by mouth 4 (four) times daily.   Yes Historical Provider, MD  loratadine (CLARITIN) 10 MG tablet Take 10 mg by mouth daily. 09/27/16   Historical Provider, MD      VITAL SIGNS:  Blood pressure 134/61, pulse 71, temperature 97.6 F (36.4 C), temperature source Oral, resp. rate 19, height _0  (1.676 m), weight 123 kg (271 lb 2.7 oz), SpO2 94 %.  PHYSICAL EXAMINATION:   Physical Exam  GENERAL:  72 y.o.-year-old patient lying in the bed with no acute distress.  EYES: Pupils equal, round, reactive to light and accommodation. No scleral icterus. Extraocular muscles intact.  HEENT: Head atraumatic, normocephalic. Oropharynx and nasopharynx clear.  NECK:  Supple, no jugular venous distention. No thyroid enlargement, no tenderness.  LUNGS: Normal breath sounds bilaterally, no wheezing, rales,rhonchi or crepitation. No use of accessory muscles of respiration.  CARDIOVASCULAR: S1, S2 normal. No rubs, or gallops. 2/6 systolic murmur ABDOMEN: Soft, nontender, nondistended. Bowel sounds present. No organomegaly or mass.  EXTREMITIES: No pedal edema, cyanosis, or clubbing.  NEUROLOGIC: Cranial nerves II through XII are intact. Muscle strength 5/5 in all extremities. Sensation intact. Gait not checked.  PSYCHIATRIC: The patient is alert and oriented x 3.  SKIN: No obvious rash, lesion, or ulcer.   LABORATORY PANEL:   CBC  Recent Labs Lab 11/09/16 1006  WBC 6.5  HGB 10.9*  HCT 33.3*  PLT 286   ------------------------------------------------------------------------------------------------------------------  Chemistries   Recent Labs Lab 11/09/16 1006  NA 140  K 3.7  CL 96*  CO2 31  GLUCOSE 79  BUN 22*  CREATININE 4.90*  CALCIUM 9.2    ------------------------------------------------------------------------------------------------------------------  Cardiac Enzymes  Recent Labs Lab 11/09/16 1006  TROPONINI 0.08*   ------------------------------------------------------------------------------------------------------------------  RADIOLOGY:  Dg Chest 2 View  Result Date: 11/09/2016 CLINICAL DATA:  Chest pain EXAM: CHEST  2 VIEW COMPARISON:  03/26/2014 FINDINGS: Mild cardiac enlargement. Aortic atherosclerosis noted. Decreased lung volumes. There is atelectasis identified within both lung bases. No pleural effusion or airspace consolidation. IMPRESSION: 1. Low lung volumes with bibasilar atelectasis. Electronically Signed   By: Kerby Moors M.D.   On: 11/09/2016 10:48   Ct Angio Chest Pe W And/or Wo Contrast  Result Date: 11/09/2016 CLINICAL DATA:  Chest pain.  Cough. EXAM: CT ANGIOGRAPHY CHEST WITH CONTRAST TECHNIQUE: Multidetector CT imaging of the chest was performed using the standard protocol during bolus administration of intravenous contrast. Multiplanar CT image reconstructions and MIPs were obtained to evaluate the vascular anatomy. CONTRAST:  75 cc Isovue 370 COMPARISON:  Chest x-rays dated 11/09/2016, 03/26/2014 and 02/16/2013 FINDINGS: Cardiovascular: There are no pulmonary emboli. There is extensive aortic atherosclerosis. Calcification in the mitral valve annulus and an aortic valve. Extensive coronary artery calcifications. RV LV ratio is normal. Mediastinum/Nodes: No enlarged  mediastinal, hilar, or axillary lymph nodes. Thyroid gland, trachea, and esophagus demonstrate no significant findings. Lungs/Pleura: There is what is most likely chronic atelectasis at the right lung base due to chronic elevation of the right hemidiaphragm. There are 3 small patchy areas of infiltrate in the left upper lobe as well some atelectasis in the lingula of the left lung. These are most likely inflammatory in origin. Each is less  than a cm in diameter. Upper Abdomen: Severe atrophy of both kidneys. Calcified granuloma in the liver. Musculoskeletal: No chest wall abnormality. No acute or significant osseous findings. Review of the MIP images confirms the above findings. IMPRESSION: 1. No pulmonary emboli. 2. Three tiny patchy areas of haziness in the left upper lobe, probably inflammatory in origin. Initial follow-up with CT at 6-12 months is recommended to confirm persistence. If persistent, repeat CT is recommended every 2 years until 5 years of stability has been established. This recommendation follows the consensus statement: Guidelines for Management of Incidental Pulmonary Nodules Detected on CT Images: From the Fleischner Society 2017; Radiology 2017; 284:228-243. 3. Extensive aortic atherosclerosis. 4. Coronary artery atherosclerosis. 5. Chronic elevation of the right hemidiaphragm with chronic atelectasis in the right lung. Electronically Signed   By: Lorriane Shire M.D.   On: 11/09/2016 14:05   US Venous Img Lower Bilateral  Result Date: 11/09/2016 CLINICAL DATA:  Leg pain and chest pain for 1 day. EXAM: BILATERAL LOWER EXTREMITY VENOUS DOPPLER ULTRASOUND TECHNIQUE: Gray-scale sonography with graded compression, as well as color Doppler and duplex ultrasound were performed to evaluate the lower extremity deep venous systems from the level of the common femoral vein and including the common femoral, femoral, profunda femoral, popliteal and calf veins including the posterior tibial, peroneal and gastrocnemius veins when visible. The superficial great saphenous vein was also interrogated. Spectral Doppler was utilized to evaluate flow at rest and with distal augmentation maneuvers in the common femoral, femoral and popliteal veins. COMPARISON:  02/17/2013. FINDINGS: RIGHT LOWER EXTREMITY Common Femoral Vein: No evidence of thrombus. Normal compressibility, respiratory phasicity and response to augmentation. Saphenofemoral Junction:  No evidence of thrombus. Normal compressibility and flow on color Doppler imaging. Profunda Femoral Vein: No evidence of thrombus. Normal compressibility and flow on color Doppler imaging. Femoral Vein: No evidence of thrombus. Normal compressibility, respiratory phasicity and response to augmentation. Popliteal Vein: No evidence of thrombus. Normal compressibility, respiratory phasicity and response to augmentation. Calf Veins: No evidence of thrombus. Normal compressibility and flow on color Doppler imaging. Superficial Great Saphenous Vein: No evidence of thrombus. Normal compressibility and flow on color Doppler imaging. Venous Reflux:  None. Other Findings:  Limited due to body habitus. LEFT LOWER EXTREMITY Common Femoral Vein: No evidence of thrombus. Normal compressibility, respiratory phasicity and response to augmentation. Saphenofemoral Junction: No evidence of thrombus. Normal compressibility and flow on color Doppler imaging. Profunda Femoral Vein: No evidence of thrombus. Normal compressibility and flow on color Doppler imaging. Femoral Vein: No evidence of thrombus. Normal compressibility, respiratory phasicity and response to augmentation. Popliteal Vein: No evidence of thrombus. Normal compressibility, respiratory phasicity and response to augmentation. Calf Veins: No evidence of thrombus. Normal compressibility and flow on color Doppler imaging. Superficial Great Saphenous Vein: No evidence of thrombus. Normal compressibility and flow on color Doppler imaging. Venous Reflux:  None. Other Findings:  Limited due to body habitus. IMPRESSION: No evidence of deep venous thrombosis in either extremity. Electronically Signed   By: Staci Righter M.D.   On: 11/09/2016 13:49    EKG:  Orders placed or performed during the hospital encounter of 11/09/16  . ED EKG within 10 minutes  . ED EKG within 10 minutes  . ED EKG  . ED EKG  . EKG 12-Lead  . EKG 12-Lead    IMPRESSION AND PLAN:   Cassandra Joseph  is a 71 y.o. 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  #1 Chest pain- possible unstable angina, chest pain is relieved now - admit ot tele, troponins x 3, first one is slightly elevated - ECHO, cards consult, myoview in AM. Check lipid panel - continue asa, coreg and statin. Nitro paste ordered - CT chest with no PE but has calcified coronary arteries  #2 ESRD on HD- almost finished the session today, also received contrast today- if has to stay until Friday- then consult nephrology for in house dialysis. Otherwise, she can f/u for outpatient dialysis  #3 HTN- continue home meds  #4 DVT Prophylaxis- SQ heparin   All the records are reviewed and case discussed with ED provider. Management plans discussed with the patient, family and they are in agreement.  CODE STATUS: Full Code  TOTAL TIME TAKING CARE OF THIS PATIENT: 50 minutes.    Gladstone Lighter M.D on 11/09/2016 at 2:37 PM  Between 7am to 6pm - Pager - [phone]  After 6pm go to [email or web address] - password [email or web address] [facility]  Office  [phone]  CC: Primary care physician; Tracie Harrier, MD

## 2016-11-10 ENCOUNTER — Observation Stay (HOSPITAL_BASED_OUTPATIENT_CLINIC_OR_DEPARTMENT_OTHER): Payer: Medicare Other

## 2016-11-10 ENCOUNTER — Encounter: Payer: Self-pay | Admitting: Radiology

## 2016-11-10 DIAGNOSIS — R079 Chest pain, unspecified: Secondary | ICD-10-CM | POA: Diagnosis not present

## 2016-11-10 DIAGNOSIS — I208 Other forms of angina pectoris: Secondary | ICD-10-CM | POA: Diagnosis not present

## 2016-11-10 LAB — BASIC METABOLIC PANEL
ANION GAP: 12 (ref 5–15)
BUN: 32 mg/dL — ABNORMAL HIGH (ref 6–20)
CALCIUM: 8.9 mg/dL (ref 8.9–10.3)
CO2: 33 mmol/L — ABNORMAL HIGH (ref 22–32)
Chloride: 95 mmol/L — ABNORMAL LOW (ref 101–111)
Creatinine, Ser: 6.87 mg/dL — ABNORMAL HIGH (ref 0.44–1.00)
GFR, EST AFRICAN AMERICAN: 6 mL/min — AB (ref >60)
GFR, EST NON AFRICAN AMERICAN: 5 mL/min — AB (ref >60)
Glucose, Bld: 88 mg/dL (ref 65–99)
Potassium: 4.5 mmol/L (ref 3.5–5.1)
SODIUM: 140 mmol/L (ref 135–145)

## 2016-11-10 LAB — NM MYOCAR MULTI W/SPECT W/WALL MOTION / EF
CHL CUP MPHR: 151 {beats}/min
CHL CUP NUCLEAR SDS: 4
Estimated workload: 1 METS
Exercise duration (min): 0 min
Exercise duration (sec): 0 s
LVDIAVOL: 180 mL (ref 46–106)
LVSYSVOL: 71 mL
NUC STRESS TID: 1.66
Peak HR: 77 {beats}/min
Percent HR: 50 %
Rest HR: 66 {beats}/min
SRS: 21
SSS: 24

## 2016-11-10 LAB — CBC
HCT: 33.8 % — ABNORMAL LOW (ref 35.0–47.0)
HEMOGLOBIN: 10.9 g/dL — AB (ref 12.0–16.0)
MCH: 29.9 pg (ref 26.0–34.0)
MCHC: 32.3 g/dL (ref 32.0–36.0)
MCV: 92.5 fL (ref 80.0–100.0)
PLATELETS: 276 10*3/uL (ref 150–440)
RBC: 3.66 MIL/uL — AB (ref 3.80–5.20)
RDW: 17.4 % — ABNORMAL HIGH (ref 11.5–14.5)
WBC: 5.8 10*3/uL (ref 3.6–11.0)

## 2016-11-10 LAB — MRSA PCR SCREENING: MRSA BY PCR: POSITIVE — AB

## 2016-11-10 LAB — ECHOCARDIOGRAM COMPLETE
Height: 66 in
Weight: 4337.6 oz

## 2016-11-10 MED ORDER — TECHNETIUM TC 99M TETROFOSMIN IV KIT
13.0000 | PACK | Freq: Once | INTRAVENOUS | Status: AC | PRN
  Administered 2016-11-10: 13.23 via INTRAVENOUS

## 2016-11-10 MED ORDER — TECHNETIUM TC 99M TETROFOSMIN IV KIT
30.0000 | PACK | Freq: Once | INTRAVENOUS | Status: AC | PRN
Start: 2016-11-10 — End: 2016-11-10
  Administered 2016-11-10: 31.199 via INTRAVENOUS

## 2016-11-10 MED ORDER — REGADENOSON 0.4 MG/5ML IV SOLN
0.4000 mg | Freq: Once | INTRAVENOUS | Status: AC
  Administered 2016-11-10: 0.4 mg via INTRAVENOUS

## 2016-11-10 NOTE — Care Management Important Message (Signed)
Important Message  Patient Details  Name: Cassandra Joseph MRN: [id number] Date of Birth: 23-Aug-1945   Medicare Important Message Given:  Yes    Katrina Stack, RN 11/10/2016, 4:50 PM

## 2016-11-10 NOTE — Care Management (Signed)
Patient placed in observation due to chest pain.  Notified [name] with [facility] of observation status and discharge.

## 2016-11-10 NOTE — Progress Notes (Signed)
Progress Note  Patient Name: Cassandra Joseph Date of Encounter: 11/10/2016  Primary Cardiologist: Andree Coss, MD   Subjective   No c/p overnight.  For MV today.  Inpatient Medications    Scheduled Meds: . amLODipine  10 mg Oral Daily  . aspirin EC  81 mg Oral Daily  . atorvastatin  40 mg Oral Daily  . carvedilol  25 mg Oral BID WC  . cinacalcet  60 mg Oral BID WC  . heparin  5,000 Units Subcutaneous Q8H  . irbesartan  300 mg Oral Daily  . loratadine  10 mg Oral Daily  . meloxicam  7.5 mg Oral Daily  . mirtazapine  15 mg Oral QHS  . multivitamin  1 tablet Oral QHS  . nitroGLYCERIN  0.5 inch Topical Q6H  . sevelamer carbonate  800 mg Oral TID WC & HS  . sodium chloride flush  3 mL Intravenous Q12H   Continuous Infusions:  PRN Meds: acetaminophen **OR** acetaminophen, ondansetron **OR** ondansetron (ZOFRAN) IV   Vital Signs    Vitals:   11/09/16 1608 11/09/16 2205 11/10/16 0641 11/10/16 0720  BP: (!) 151/74 (!) 143/64 (!) 152/81 134/61  Pulse: 77 69 70 67  Resp: 19 (!) _0 Temp: 98.1 F (36.7 C) 98 F (36.7 C) 97.9 F (36.6 C) 98.6 F (37 C)  TempSrc: Oral Oral Oral Oral  SpO2: 95% (!) 88% 96% 91%  Weight: 271 lb 1.6 oz (123 kg)     Height: _1  (1.676 m)       Intake/Output Summary (Last 24 hours) at 11/10/16 1024 Last data filed at 11/09/16 1845  Gross per 24 hour  Intake              240 ml  Output                0 ml  Net              240 ml   Filed Weights   11/09/16 1009 11/09/16 1608  Weight: 271 lb 2.7 oz (123 kg) 271 lb 1.6 oz (123 kg)    Physical Exam   GEN: Well nourished, well developed, in no acute distress.  HEENT: Grossly normal.  Neck: Supple, no JVD, carotid bruits, or masses. Cardiac: RRR, no murmurs, rubs, or gallops. No clubbing, cyanosis, edema.  Radials/DP/PT 2+ and equal bilaterally.  Respiratory:  Respirations regular and unlabored, clear to auscultation bilaterally. GI: Soft, nontender, nondistended, BS + x 4. MS: no  deformity or atrophy. Skin: warm and dry, no rash. Neuro:  Strength and sensation are intact. Psych: AAOx3.  Normal affect.  Labs    Chemistry Recent Labs Lab 11/09/16 1006 11/10/16 0244  NA 140 140  K 3.7 4.5  CL 96* 95*  CO2 31 33*  GLUCOSE 79 88  BUN 22* 32*  CREATININE 4.90* 6.87*  CALCIUM 9.2 8.9  GFRNONAA 8* 5*  GFRAA 10* 6*  ANIONGAP 13 12     Hematology Recent Labs Lab 11/09/16 1006 11/10/16 0244  WBC 6.5 5.8  RBC 3.66* 3.66*  HGB 10.9* 10.9*  HCT 33.3* 33.8*  MCV 91.0 92.5  MCH 29.8 29.9  MCHC 32.8 32.3  RDW 17.2* 17.4*  PLT 286 276    Cardiac Enzymes Recent Labs Lab 11/09/16 1006 11/09/16 1503 11/09/16 2109 11/10/16 0244  TROPONINI 0.08* 0.07* 0.06* 0.05*    Radiology    Dg Chest 2 View  Result Date: 11/09/2016 CLINICAL DATA:  Chest pain  EXAM: CHEST  2 VIEW COMPARISON:  03/26/2014 FINDINGS: Mild cardiac enlargement. Aortic atherosclerosis noted. Decreased lung volumes. There is atelectasis identified within both lung bases. No pleural effusion or airspace consolidation. IMPRESSION: 1. Low lung volumes with bibasilar atelectasis. Electronically Signed   By: Kerby Moors M.D.   On: 11/09/2016 10:48   Ct Angio Chest Pe W And/or Wo Contrast  Result Date: 11/09/2016 CLINICAL DATA:  Chest pain.  Cough. EXAM: CT ANGIOGRAPHY CHEST WITH CONTRAST TECHNIQUE: Multidetector CT imaging of the chest was performed using the standard protocol during bolus administration of intravenous contrast. Multiplanar CT image reconstructions and MIPs were obtained to evaluate the vascular anatomy. CONTRAST:  75 cc Isovue 370 COMPARISON:  Chest x-rays dated 11/09/2016, 03/26/2014 and 02/16/2013 FINDINGS: Cardiovascular: There are no pulmonary emboli. There is extensive aortic atherosclerosis. Calcification in the mitral valve annulus and an aortic valve. Extensive coronary artery calcifications. RV LV ratio is normal. Mediastinum/Nodes: No enlarged mediastinal, hilar, or  axillary lymph nodes. Thyroid gland, trachea, and esophagus demonstrate no significant findings. Lungs/Pleura: There is what is most likely chronic atelectasis at the right lung base due to chronic elevation of the right hemidiaphragm. There are 3 small patchy areas of infiltrate in the left upper lobe as well some atelectasis in the lingula of the left lung. These are most likely inflammatory in origin. Each is less than a cm in diameter. Upper Abdomen: Severe atrophy of both kidneys. Calcified granuloma in the liver. Musculoskeletal: No chest wall abnormality. No acute or significant osseous findings. Review of the MIP images confirms the above findings. IMPRESSION: 1. No pulmonary emboli. 2. Three tiny patchy areas of haziness in the left upper lobe, probably inflammatory in origin. Initial follow-up with CT at 6-12 months is recommended to confirm persistence. If persistent, repeat CT is recommended every 2 years until 5 years of stability has been established. This recommendation follows the consensus statement: Guidelines for Management of Incidental Pulmonary Nodules Detected on CT Images: From the Fleischner Society 2017; Radiology 2017; 284:228-243. 3. Extensive aortic atherosclerosis. 4. Coronary artery atherosclerosis. 5. Chronic elevation of the right hemidiaphragm with chronic atelectasis in the right lung. Electronically Signed   By: Lorriane Shire M.D.   On: 11/09/2016 14:05   US Venous Img Lower Bilateral  Result Date: 11/09/2016 CLINICAL DATA:  Leg pain and chest pain for 1 day. EXAM: BILATERAL LOWER EXTREMITY VENOUS DOPPLER ULTRASOUND TECHNIQUE: Gray-scale sonography with graded compression, as well as color Doppler and duplex ultrasound were performed to evaluate the lower extremity deep venous systems from the level of the common femoral vein and including the common femoral, femoral, profunda femoral, popliteal and calf veins including the posterior tibial, peroneal and gastrocnemius veins  when visible. The superficial great saphenous vein was also interrogated. Spectral Doppler was utilized to evaluate flow at rest and with distal augmentation maneuvers in the common femoral, femoral and popliteal veins. COMPARISON:  02/17/2013. FINDINGS: RIGHT LOWER EXTREMITY Common Femoral Vein: No evidence of thrombus. Normal compressibility, respiratory phasicity and response to augmentation. Saphenofemoral Junction: No evidence of thrombus. Normal compressibility and flow on color Doppler imaging. Profunda Femoral Vein: No evidence of thrombus. Normal compressibility and flow on color Doppler imaging. Femoral Vein: No evidence of thrombus. Normal compressibility, respiratory phasicity and response to augmentation. Popliteal Vein: No evidence of thrombus. Normal compressibility, respiratory phasicity and response to augmentation. Calf Veins: No evidence of thrombus. Normal compressibility and flow on color Doppler imaging. Superficial Great Saphenous Vein: No evidence of thrombus. Normal compressibility and  flow on color Doppler imaging. Venous Reflux:  None. Other Findings:  Limited due to body habitus. LEFT LOWER EXTREMITY Common Femoral Vein: No evidence of thrombus. Normal compressibility, respiratory phasicity and response to augmentation. Saphenofemoral Junction: No evidence of thrombus. Normal compressibility and flow on color Doppler imaging. Profunda Femoral Vein: No evidence of thrombus. Normal compressibility and flow on color Doppler imaging. Femoral Vein: No evidence of thrombus. Normal compressibility, respiratory phasicity and response to augmentation. Popliteal Vein: No evidence of thrombus. Normal compressibility, respiratory phasicity and response to augmentation. Calf Veins: No evidence of thrombus. Normal compressibility and flow on color Doppler imaging. Superficial Great Saphenous Vein: No evidence of thrombus. Normal compressibility and flow on color Doppler imaging. Venous Reflux:  None.  Other Findings:  Limited due to body habitus. IMPRESSION: No evidence of deep venous thrombosis in either extremity. Electronically Signed   By: Staci Righter M.D.   On: 11/09/2016 13:49    Telemetry    RSR - Personally Reviewed  Cardiac Studies   2D Echocardiogram pending  Patient Profile      71 y.o. female who is being seen today for the evaluation of chest pain at the request of Dr. Tressia Miners, MD. 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   Assessment & Plan    1.  Midsternal Chest Pain/elevated troponin:  Admitted 4/4 with a 2 month h/o intermittent chest pressure, occurring mostly @ night while lying down, with acute worsening @ dialysis on 4/4.  Troponins mildly elevated w/ flat trend (.08  .07  .06  .05) in setting of ESRD.  Echo pending.  No c/p overnight.  For myoview today.  CTA chest 4/4 neg for PE.  Coronary Ca2+ noted.  ? LUL inflammatory process.  Provided that myoview low risk, can likely be d/c'd later this afternoon.  If high risk, will need cath.  Cont asa, statin,  blocker.  2.  ESRD:  HD per nephrology.  3.  Essential HTN:  BP up overnight but stable this am.  Cont  blocker, ARB.  4.  HL:  LDL 146.  Statin initiated in setting of coronary Ca2+.  5.  Normocytic anemia:  In setting of ESRD.  Stable.  6.  Multiple Myeloma:  Followed @ [facility].  Signed, Murray Hodgkins, NP  11/10/2016, 10:24 AM     Attending Note Patient seen and examined, agree with  detailed note above,  Patient presentation and plan discussed on rounds.    Stress test with fixed inferior perfusion defect consistent with previous MI, No significant ischemia  ECHO with normal LV function  No further cardiac testing needed Recommend outpt clinic follow up   Signed: Esmond Plants  M.D., Ph.D. [facility]

## 2016-11-10 NOTE — Discharge Summary (Signed)
[facility] NAME: Cassandra Joseph    MR#:  [id number]  DATE OF BIRTH:  [birth year]  DATE OF ADMISSION:  11/09/2016 ADMITTING PHYSICIAN: Gladstone Lighter, MD  DATE OF DISCHARGE: 11/10/2016  PRIMARY CARE PHYSICIAN: Tracie Harrier, MD    ADMISSION DIAGNOSIS:  Angina at rest [facility]) [I20.8] NSTEMI (non-ST elevated myocardial infarction) ([place]) [I21.4]  DISCHARGE DIAGNOSIS:  Atypical chest pain ESRD on HD  SECONDARY DIAGNOSIS:   Past Medical History:  Diagnosis Date  . 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 ESRD (end stage renal disease) ([place])    a. on HD x 11 years; b. felt to be 2/2 HTN; c. HD TTS  . History of nuclear stress test    a. 08/2015: probably normal myocardial perfusion study, no evidence for significant ischemia or scar was noted, during stress, global systolic function normal, EF 54%, RV borderline dilatation, coronary artery calcifications and marked mitral annular calcifications were noted, liver calcifications were noted on attenuation CT scan likely represent sequelae of previous granulomatous disease  . HLD (hyperlipidemia)   . Hypertension   . Ischemic stroke ([place])    a. 03/2014  . Metabolic syndrome   . Morbid obesity ([place])   . Multiple myeloma (HCC)    a. in remission  . Osteomyelitis ([place])    a. 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 Rheumatoid arthritis ([place])   . TIA (transient ischemic attack)    a. 2014    HOSPITAL COURSE:   Cassandra Joseph  is a 71 y.o. 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  #1 Chest pain- appears atypical - borderline elevated troponin appears demand ischemia - Myoview negative - continue asa, coreg and statin.  -cont asa - CT chest with no PE but has calcified coronary arteries  #2 ESRD on HD -resume HD on MWF  #3 HTN- continue home meds  #4 DVT Prophylaxis-  SQ heparin  Overall stable. Discussed with family in the room. Discharge home.  CONSULTS OBTAINED:  Treatment Team:  Nelva Bush, MD  DRUG ALLERGIES:   Allergies  Allergen Reactions  . Shellfish Allergy     DISCHARGE MEDICATIONS:   Current Discharge Medication List    CONTINUE these medications which have NOT CHANGED   Details  amLODipine (NORVASC) 10 MG tablet Take 10 mg by mouth daily. Refills: 11    aspirin EC 81 MG tablet Take 81 mg by mouth daily.    atorvastatin (LIPITOR) 40 MG tablet Take 40 mg by mouth daily.    b complex-vitamin c-folic acid (NEPHRO-VITE) 0.8 MG TABS tablet Take 1 tablet by mouth daily. Refills: 11    carvedilol (COREG) 25 MG tablet Take 25 mg by mouth 2 (two) times daily.    cinacalcet (SENSIPAR) 60 MG tablet Take 60 mg by mouth 2 (two) times daily.    irbesartan (AVAPRO) 300 MG tablet Take 300 mg by mouth daily.    meloxicam (MOBIC) 7.5 MG tablet Take 7.5 mg by mouth daily. Refills: 3    mirtazapine (REMERON) 15 MG tablet Take 15 mg by mouth at bedtime. Refills: 3    sevelamer carbonate (RENVELA) 800 MG tablet Take 800 mg by mouth 4 (four) times daily.    loratadine (CLARITIN) 10 MG tablet Take 10 mg by mouth daily. Refills: 6        If you experience worsening of  your admission symptoms, develop shortness of breath, life threatening emergency, suicidal or homicidal thoughts you must seek medical attention immediately by calling 911 or calling your MD immediately  if symptoms less severe.  You Must read complete instructions/literature along with all the possible adverse reactions/side effects for all the Medicines you take and that have been prescribed to you. Take any new Medicines after you have completely understood and accept all the possible adverse reactions/side effects.   Please note  You were cared for by a hospitalist during your hospital stay. If you have any questions about your discharge medications or the care  you received while you were in the hospital after you are discharged, you can call the unit and asked to speak with the hospitalist on call if the hospitalist that took care of you is not available. Once you are discharged, your primary care physician will handle any further medical issues. Please note that NO REFILLS for any discharge medications will be authorized once you are discharged, as it is imperative that you return to your primary care physician (or establish a relationship with a primary care physician if you do not have one) for your aftercare needs so that they can reassess your need for medications and monitor your lab values. Today   SUBJECTIVE   Denies any chest pain or shortness of breath.  VITAL SIGNS:  Blood pressure (!) 165/72, pulse 79, temperature 97.5 F (36.4 C), resp. rate 18, height 5' 6"  (1.676 m), weight 123 kg (271 lb 1.6 oz), SpO2 96 %.  I/O:   Intake/Output Summary (Last 24 hours) at 11/10/16 1420 Last data filed at 11/09/16 1845  Gross per 24 hour  Intake              240 ml  Output                0 ml  Net              240 ml    PHYSICAL EXAMINATION:  GENERAL:  71 y.o.-year-old patient lying in the bed with no acute distress. Morbidly obese EYES: Pupils equal, round, reactive to light and accommodation. No scleral icterus. Extraocular muscles intact.  HEENT: Head atraumatic, normocephalic. Oropharynx and nasopharynx clear.  NECK:  Supple, no jugular venous distention. No thyroid enlargement, no tenderness.  LUNGS: Normal breath sounds bilaterally, no wheezing, rales,rhonchi or crepitation. No use of accessory muscles of respiration.  CARDIOVASCULAR: S1, S2 normal. No murmurs, rubs, or gallops.  ABDOMEN: Soft, non-tender, non-distended. Bowel sounds present. No organomegaly or mass.  EXTREMITIES: No pedal edema, cyanosis, or clubbing.  NEUROLOGIC: Cranial nerves II through XII are intact. Muscle strength 5/5 in all extremities. Sensation intact.  Gait  not checked.  PSYCHIATRIC: patient is alert and oriented x 3.  SKIN: No obvious rash, lesion, or ulcer.   DATA REVIEW:   CBC   Recent Labs Lab 11/10/16 0244  WBC 5.8  HGB 10.9*  HCT 33.8*  PLT 276    Chemistries   Recent Labs Lab 11/10/16 0244  NA 140  K 4.5  CL 95*  CO2 33*  GLUCOSE 88  BUN 32*  CREATININE 6.87*  CALCIUM 8.9    Microbiology Results   Recent Results (from the past 240 hour(s))  MRSA PCR Screening     Status: Abnormal   Collection Time: 11/10/16  6:06 AM  Result Value Ref Range Status   MRSA by PCR POSITIVE (A) NEGATIVE Final    Comment:  The GeneXpert MRSA Assay (FDA approved for NASAL specimens only), is one component of a comprehensive MRSA colonization surveillance program. It is not intended to diagnose MRSA infection nor to guide or monitor treatment for MRSA infections. RESULT CALLED TO, READ BACK BY AND VERIFIED WITH: JESSICA CHRISTMAS ON 11/10/16 AT 2952 QSD     RADIOLOGY:  Dg Chest 2 View  Result Date: 11/09/2016 CLINICAL DATA:  Chest pain EXAM: CHEST  2 VIEW COMPARISON:  03/26/2014 FINDINGS: Mild cardiac enlargement. Aortic atherosclerosis noted. Decreased lung volumes. There is atelectasis identified within both lung bases. No pleural effusion or airspace consolidation. IMPRESSION: 1. Low lung volumes with bibasilar atelectasis. Electronically Signed   By: Kerby Moors M.D.   On: 11/09/2016 10:48   Ct Angio Chest Pe W And/or Wo Contrast  Result Date: 11/09/2016 CLINICAL DATA:  Chest pain.  Cough. EXAM: CT ANGIOGRAPHY CHEST WITH CONTRAST TECHNIQUE: Multidetector CT imaging of the chest was performed using the standard protocol during bolus administration of intravenous contrast. Multiplanar CT image reconstructions and MIPs were obtained to evaluate the vascular anatomy. CONTRAST:  75 cc Isovue 370 COMPARISON:  Chest x-rays dated 11/09/2016, 03/26/2014 and 02/16/2013 FINDINGS: Cardiovascular: There are no pulmonary emboli.  There is extensive aortic atherosclerosis. Calcification in the mitral valve annulus and an aortic valve. Extensive coronary artery calcifications. RV LV ratio is normal. Mediastinum/Nodes: No enlarged mediastinal, hilar, or axillary lymph nodes. Thyroid gland, trachea, and esophagus demonstrate no significant findings. Lungs/Pleura: There is what is most likely chronic atelectasis at the right lung base due to chronic elevation of the right hemidiaphragm. There are 3 small patchy areas of infiltrate in the left upper lobe as well some atelectasis in the lingula of the left lung. These are most likely inflammatory in origin. Each is less than a cm in diameter. Upper Abdomen: Severe atrophy of both kidneys. Calcified granuloma in the liver. Musculoskeletal: No chest wall abnormality. No acute or significant osseous findings. Review of the MIP images confirms the above findings. IMPRESSION: 1. No pulmonary emboli. 2. Three tiny patchy areas of haziness in the left upper lobe, probably inflammatory in origin. Initial follow-up with CT at 6-12 months is recommended to confirm persistence. If persistent, repeat CT is recommended every 2 years until 5 years of stability has been established. This recommendation follows the consensus statement: Guidelines for Management of Incidental Pulmonary Nodules Detected on CT Images: From the Fleischner Society 2017; Radiology 2017; 284:228-243. 3. Extensive aortic atherosclerosis. 4. Coronary artery atherosclerosis. 5. Chronic elevation of the right hemidiaphragm with chronic atelectasis in the right lung. Electronically Signed   By: Lorriane Shire M.D.   On: 11/09/2016 14:05   Nm Myocar Multi W/spect W/wall Motion / Ef  Result Date: 11/10/2016 Pharmacological myocardial perfusion imaging study with no significant  Ischemia Moderate sized region of fixed defect in the periapical and apical region consistent with previous MI. Apical and peri-apical hypokinesis, EF estimated at  46% No EKG changes concerning for ischemia at peak stress or in recovery. Moderate risk scan given depressed EF and old MI. Signed, Esmond Plants, MD, Ph.D [facility]   US Venous Img Lower Bilateral  Result Date: 11/09/2016 CLINICAL DATA:  Leg pain and chest pain for 1 day. EXAM: BILATERAL LOWER EXTREMITY VENOUS DOPPLER ULTRASOUND TECHNIQUE: Gray-scale sonography with graded compression, as well as color Doppler and duplex ultrasound were performed to evaluate the lower extremity deep venous systems from the level of the common femoral vein and including the common femoral, femoral, profunda femoral,  popliteal and calf veins including the posterior tibial, peroneal and gastrocnemius veins when visible. The superficial great saphenous vein was also interrogated. Spectral Doppler was utilized to evaluate flow at rest and with distal augmentation maneuvers in the common femoral, femoral and popliteal veins. COMPARISON:  02/17/2013. FINDINGS: RIGHT LOWER EXTREMITY Common Femoral Vein: No evidence of thrombus. Normal compressibility, respiratory phasicity and response to augmentation. Saphenofemoral Junction: No evidence of thrombus. Normal compressibility and flow on color Doppler imaging. Profunda Femoral Vein: No evidence of thrombus. Normal compressibility and flow on color Doppler imaging. Femoral Vein: No evidence of thrombus. Normal compressibility, respiratory phasicity and response to augmentation. Popliteal Vein: No evidence of thrombus. Normal compressibility, respiratory phasicity and response to augmentation. Calf Veins: No evidence of thrombus. Normal compressibility and flow on color Doppler imaging. Superficial Great Saphenous Vein: No evidence of thrombus. Normal compressibility and flow on color Doppler imaging. Venous Reflux:  None. Other Findings:  Limited due to body habitus. LEFT LOWER EXTREMITY Common Femoral Vein: No evidence of thrombus. Normal compressibility, respiratory phasicity and  response to augmentation. Saphenofemoral Junction: No evidence of thrombus. Normal compressibility and flow on color Doppler imaging. Profunda Femoral Vein: No evidence of thrombus. Normal compressibility and flow on color Doppler imaging. Femoral Vein: No evidence of thrombus. Normal compressibility, respiratory phasicity and response to augmentation. Popliteal Vein: No evidence of thrombus. Normal compressibility, respiratory phasicity and response to augmentation. Calf Veins: No evidence of thrombus. Normal compressibility and flow on color Doppler imaging. Superficial Great Saphenous Vein: No evidence of thrombus. Normal compressibility and flow on color Doppler imaging. Venous Reflux:  None. Other Findings:  Limited due to body habitus. IMPRESSION: No evidence of deep venous thrombosis in either extremity. Electronically Signed   By: Staci Righter M.D.   On: 11/09/2016 13:49     Management plans discussed with the patient, family and they are in agreement.  CODE STATUS:     Code Status Orders        Start     Ordered   11/09/16 1503  Full code  Continuous     11/09/16 1503    Code Status History    Date Active Date Inactive Code Status Order ID Comments User Context   This patient has a current code status but no historical code status.      TOTAL TIME TAKING CARE OF THIS PATIENT: 40 minutes.    Cassandra Joseph M.D on 11/10/2016 at 2:20 PM  Between 7am to 6pm - Pager - [phone] After 6pm go to [email or web address] - password [email or web address] [facility]  Office  [phone]  CC: Primary care physician; Tracie Harrier, MD

## 2016-11-10 NOTE — Progress Notes (Signed)
Discharge instructions explained to pt/ verbalized an understanding/ iv and tele removed/ transported off unit via wheelchair,

## 2016-11-11 LAB — TROPONIN I: TROPONIN I: 0.05 ng/mL — AB (ref <0.03)

## 2017-01-18 ENCOUNTER — Emergency Department: Payer: Medicare Other

## 2017-01-18 ENCOUNTER — Other Ambulatory Visit: Payer: Self-pay

## 2017-01-18 ENCOUNTER — Observation Stay
Admission: EM | Admit: 2017-01-18 | Discharge: 2017-01-20 | Disposition: A | Discharge status: Home / Self Care | Payer: Medicare Other | Attending: Internal Medicine | Admitting: Internal Medicine

## 2017-01-18 DIAGNOSIS — Z91013 Allergy to seafood: Secondary | ICD-10-CM | POA: Diagnosis not present

## 2017-01-18 DIAGNOSIS — E785 Hyperlipidemia, unspecified: Secondary | ICD-10-CM | POA: Diagnosis not present

## 2017-01-18 DIAGNOSIS — Z87891 Personal history of nicotine dependence: Secondary | ICD-10-CM | POA: Insufficient documentation

## 2017-01-18 DIAGNOSIS — R05 Cough: Secondary | ICD-10-CM | POA: Insufficient documentation

## 2017-01-18 DIAGNOSIS — M069 Rheumatoid arthritis, unspecified: Secondary | ICD-10-CM | POA: Diagnosis not present

## 2017-01-18 DIAGNOSIS — N2581 Secondary hyperparathyroidism of renal origin: Secondary | ICD-10-CM | POA: Diagnosis not present

## 2017-01-18 DIAGNOSIS — Z7982 Long term (current) use of aspirin: Secondary | ICD-10-CM | POA: Insufficient documentation

## 2017-01-18 DIAGNOSIS — R0902 Hypoxemia: Secondary | ICD-10-CM | POA: Diagnosis not present

## 2017-01-18 DIAGNOSIS — N186 End stage renal disease: Secondary | ICD-10-CM | POA: Diagnosis not present

## 2017-01-18 DIAGNOSIS — Z992 Dependence on renal dialysis: Secondary | ICD-10-CM | POA: Diagnosis not present

## 2017-01-18 DIAGNOSIS — I10 Essential (primary) hypertension: Secondary | ICD-10-CM | POA: Diagnosis present

## 2017-01-18 DIAGNOSIS — D631 Anemia in chronic kidney disease: Secondary | ICD-10-CM | POA: Insufficient documentation

## 2017-01-18 DIAGNOSIS — I5033 Acute on chronic diastolic (congestive) heart failure: Secondary | ICD-10-CM | POA: Diagnosis present

## 2017-01-18 DIAGNOSIS — I509 Heart failure, unspecified: Secondary | ICD-10-CM

## 2017-01-18 DIAGNOSIS — Z89422 Acquired absence of other left toe(s): Secondary | ICD-10-CM | POA: Insufficient documentation

## 2017-01-18 DIAGNOSIS — I132 Hypertensive heart and chronic kidney disease with heart failure and with stage 5 chronic kidney disease, or end stage renal disease: Secondary | ICD-10-CM | POA: Diagnosis not present

## 2017-01-18 DIAGNOSIS — C9001 Multiple myeloma in remission: Secondary | ICD-10-CM | POA: Insufficient documentation

## 2017-01-18 DIAGNOSIS — E8881 Metabolic syndrome: Secondary | ICD-10-CM | POA: Insufficient documentation

## 2017-01-18 DIAGNOSIS — Z791 Long term (current) use of non-steroidal anti-inflammatories (NSAID): Secondary | ICD-10-CM | POA: Diagnosis not present

## 2017-01-18 DIAGNOSIS — Z8673 Personal history of transient ischemic attack (TIA), and cerebral infarction without residual deficits: Secondary | ICD-10-CM | POA: Insufficient documentation

## 2017-01-18 DIAGNOSIS — I517 Cardiomegaly: Secondary | ICD-10-CM | POA: Diagnosis not present

## 2017-01-18 DIAGNOSIS — R059 Cough, unspecified: Secondary | ICD-10-CM

## 2017-01-18 DIAGNOSIS — Z79899 Other long term (current) drug therapy: Secondary | ICD-10-CM | POA: Insufficient documentation

## 2017-01-18 LAB — TROPONIN I: Troponin I: 0.05 ng/mL (ref <0.03)

## 2017-01-18 LAB — COMPREHENSIVE METABOLIC PANEL
ALBUMIN: 3.2 g/dL — AB (ref 3.5–5.0)
ALT: 8 U/L — ABNORMAL LOW (ref 14–54)
ANION GAP: 9 (ref 5–15)
AST: 14 U/L — AB (ref 15–41)
Alkaline Phosphatase: 67 U/L (ref 38–126)
BILIRUBIN TOTAL: 0.8 mg/dL (ref 0.3–1.2)
BUN: 22 mg/dL — ABNORMAL HIGH (ref 6–20)
CHLORIDE: 95 mmol/L — AB (ref 101–111)
CO2: 33 mmol/L — ABNORMAL HIGH (ref 22–32)
Calcium: 9.4 mg/dL (ref 8.9–10.3)
Creatinine, Ser: 5.82 mg/dL — ABNORMAL HIGH (ref 0.44–1.00)
GFR calc Af Amer: 8 mL/min — ABNORMAL LOW (ref >60)
GFR calc non Af Amer: 7 mL/min — ABNORMAL LOW (ref >60)
GLUCOSE: 95 mg/dL (ref 65–99)
POTASSIUM: 3.9 mmol/L (ref 3.5–5.1)
Sodium: 137 mmol/L (ref 135–145)
Total Protein: 8 g/dL (ref 6.5–8.1)

## 2017-01-18 LAB — CBC WITH DIFFERENTIAL/PLATELET
Basophils Absolute: 0.1 10*3/uL (ref 0–0.1)
Basophils Relative: 1 %
Eosinophils Absolute: 0.2 10*3/uL (ref 0–0.7)
Eosinophils Relative: 2 %
HEMATOCRIT: 28.2 % — AB (ref 35.0–47.0)
Hemoglobin: 9.3 g/dL — ABNORMAL LOW (ref 12.0–16.0)
LYMPHS ABS: 1.6 10*3/uL (ref 1.0–3.6)
LYMPHS PCT: 17 %
MCH: 29.8 pg (ref 26.0–34.0)
MCHC: 33 g/dL (ref 32.0–36.0)
MCV: 90.2 fL (ref 80.0–100.0)
MONO ABS: 1.2 10*3/uL — AB (ref 0.2–0.9)
Monocytes Relative: 13 %
NEUTROS ABS: 6.4 10*3/uL (ref 1.4–6.5)
Neutrophils Relative %: 67 %
Platelets: 237 10*3/uL (ref 150–440)
RBC: 3.13 MIL/uL — AB (ref 3.80–5.20)
RDW: 17.6 % — ABNORMAL HIGH (ref 11.5–14.5)
WBC: 9.4 10*3/uL (ref 3.6–11.0)

## 2017-01-18 LAB — LACTIC ACID, PLASMA: Lactic Acid, Venous: 0.9 mmol/L (ref 0.5–1.9)

## 2017-01-18 LAB — BLOOD GAS, VENOUS
Acid-Base Excess: 11.9 mmol/L — ABNORMAL HIGH (ref 0.0–2.0)
BICARBONATE: 36.6 mmol/L — AB (ref 20.0–28.0)
O2 Saturation: 83.8 %
PH VEN: 7.49 — AB (ref 7.250–7.430)
PO2 VEN: 44 mmHg (ref 32.0–45.0)
Patient temperature: 37
pCO2, Ven: 48 mmHg (ref 44.0–60.0)

## 2017-01-18 LAB — BRAIN NATRIURETIC PEPTIDE: B Natriuretic Peptide: 1475 pg/mL — ABNORMAL HIGH (ref 0.0–100.0)

## 2017-01-18 MED ORDER — ONDANSETRON HCL 4 MG/2ML IJ SOLN
4.0000 mg | Freq: Once | INTRAMUSCULAR | Status: AC
  Administered 2017-01-18: 4 mg via INTRAVENOUS

## 2017-01-18 MED ORDER — CEFTRIAXONE SODIUM 1 G IJ SOLR
1.0000 g | Freq: Once | INTRAMUSCULAR | Status: AC
  Administered 2017-01-18: 1 g via INTRAVENOUS

## 2017-01-18 MED ORDER — DEXTROSE 5 % IV SOLN
500.0000 mg | Freq: Once | INTRAVENOUS | Status: AC
  Administered 2017-01-18: 500 mg via INTRAVENOUS

## 2017-01-18 MED ORDER — FUROSEMIDE 10 MG/ML IJ SOLN: 60.0000 mg | Freq: Once | INTRAMUSCULAR | Status: DC

## 2017-01-18 MED ORDER — ACETAMINOPHEN 500 MG PO TABS
1000.0000 mg | ORAL_TABLET | Freq: Once | ORAL | Status: AC
  Administered 2017-01-18: 1000 mg via ORAL

## 2017-01-18 MED ORDER — SODIUM CHLORIDE 0.9 % IV BOLUS (SEPSIS)
500.0000 mL | Freq: Once | INTRAVENOUS | Status: AC
  Administered 2017-01-18: 500 mL via INTRAVENOUS

## 2017-01-18 NOTE — ED Notes (Signed)
+   bruit and thrill R AV fistula, sleeve applied.

## 2017-01-18 NOTE — ED Provider Notes (Signed)
[facility] Emergency Department Provider Note  ____________________________________________  Time seen: Approximately 9:12 PM  I have reviewed the triage vital signs and the nursing notes.   HISTORY  Chief Complaint Shortness of Breath    HPI Cassandra Joseph is a 71 y.o. female with a history of ESRD on HD, last dialyzed today, CVA, multiple myeloma and remission, presenting with shortness of breath and hypoxia. The patient reports that she used to use oxygen and CPAP for sleep apnea, but self discontinued these. Over the last several weeks, she has noted increasing shortness of breath and decreased exercise tolerance.Over the past 2 or 3 days, the patient has also noted a cough productive of thick sputum without any fevers or chills, congestion or rhinorrhea, sore throat or ear pain. She has felt "like they're not getting all the fluid off of me," when she is at dialysis. Her shortness of breath is worse with exertion or laying flat. On arrival to the emergency department the patient has an oxygen saturation of 79% which improves to 96% on 2 L nasal cannula.   Past Medical History:  Diagnosis Date  . 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 ESRD (end stage renal disease) ([place])    a. on HD x 11 years; b. felt to be 2/2 HTN; c. HD TTS  . History of nuclear stress test    a. 08/2015: probably normal myocardial perfusion study, no evidence for significant ischemia or scar was noted, during stress, global systolic function normal, EF 54%, RV borderline dilatation, coronary artery calcifications and marked mitral annular calcifications were noted, liver calcifications were noted on attenuation CT scan likely represent sequelae of previous granulomatous disease  . HLD (hyperlipidemia)   . Hypertension   . Ischemic stroke ([place])    a. 03/2014  . Metabolic syndrome   . Morbid obesity ([place])   . Multiple myeloma (HCC)    a. in remission  . Osteomyelitis ([place])    a.  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 Rheumatoid arthritis ([place])   . TIA (transient ischemic attack)    a. 2014    Patient Active Problem List   Diagnosis Date Noted  . Atypical chest pain 11/09/2016  . ESRD on hemodialysis ([place]) 11/09/2016  . Morbid obesity ([place]) 11/09/2016  . Essential hypertension 11/09/2016  . Multiple myeloma ([place]) 11/09/2016  . Elevated troponin 11/09/2016  . Metabolic syndrome 00/86/7619    Past Surgical History:  Procedure Laterality Date  . ABDOMINAL HYSTERECTOMY    . APPENDECTOMY    . eyelid surgery    . HERNIA REPAIR    . right knee surgery      Current Outpatient Rx  . Order #: [id number] Class: Historical Med  . Order #: [id number] Class: Historical Med  . Order #: [id number] Class: Historical Med  . Order #: [id number] Class: Historical Med  . Order #: [id number] Class: Historical Med  . Order #: [id number] Class: Historical Med  . Order #: [id number] Class: Historical Med  . Order #: [id number] Class: Historical Med  . Order #: [id number] Class: Historical Med  . Order #: [id number] Class: Historical Med  . Order #: [id number] Class: Historical Med    Allergies Shellfish allergy  Family History  Problem Relation Age of Onset  . Seizures Mother   . Hypertension Mother   . Throat cancer Father   . Breast cancer Neg Hx     Social History Social History  Substance Use Topics  . Smoking status: Former Research scientist (life sciences)  . Smokeless  tobacco: Never Used     Comment: quit in 2007  . Alcohol use No    Review of Systems Constitutional: No fever/chills.No lightheadedness or syncope. Eyes: No visual changes. ENT: No sore throat. No congestion or rhinorrhea. No ear pain. Cardiovascular: Denies chest pain. Denies palpitations. Respiratory: Positive shortness of breath.  Positive productive cough. Gastrointestinal: No abdominal pain.  No nausea, no vomiting.  No diarrhea.  No constipation. Genitourinary: Negative for dysuria. Musculoskeletal: Negative for back  pain. Skin: Negative for rash. Neurological: Negative for headaches. No focal numbness, tingling or weakness.     ____________________________________________   PHYSICAL EXAM:  VITAL SIGNS: ED Triage Vitals  Enc Vitals Group     BP 01/18/17 2030 (!) 122/47     Pulse Rate 01/18/17 2103 75     Resp 01/18/17 2030 20     Temp 01/18/17 2030 99.1 F (37.3 C)     Temp Source 01/18/17 2030 Oral     SpO2 01/18/17 2030 (!) 79 %     Weight 01/18/17 2025 270 lb (122.5 kg)     Height 01/18/17 2025 5' 6"  (1.676 m)     Head Circumference --      Peak Flow --      Pain Score --      Pain Loc --      Pain Edu? --      Excl. in [place]? --     Constitutional: Alert and oriented. Chronically ill-appearing appearing but in no acute distress. Answers questions appropriately. Eyes: Conjunctivae are normal.  EOMI. No scleral icterus. Head: Atraumatic. Nose: No congestion/rhinnorhea. Mouth/Throat: Mucous membranes are moist.  Neck: No stridor.  Supple.  No JVD. Cardiovascular: Normal rate, regular rhythm. No murmurs, rubs or gallops.  Respiratory: Normal respiratory effort.  No accessory muscle use or retractions. Rales in the bases bilaterally, right greater than left. No wheezes or rhonchi. Gastrointestinal: Morbidly obese. Soft, nontender and nondistended.  No guarding or rebound.  No peritoneal signs. Musculoskeletal: No LE edema. No ttp in the calves or palpable cords.  Negative Homan's sign. Patient has a AV fistula in the right upper extremity that has an excellent thrill. Neurologic:  A&Ox3.  Speech is clear.  Face and smile are symmetric.  EOMI.  Moves all extremities well. Skin:  Skin is warm, dry and intact. No rash noted. Psychiatric: Mood and affect are normal. Speech and behavior are normal.  Normal judgement.  ____________________________________________   LABS (all labs ordered are listed, but only abnormal results are displayed)  Labs Reviewed  CBC WITH DIFFERENTIAL/PLATELET -  Abnormal; Notable for the following:       Result Value   RBC 3.13 (*)    Hemoglobin 9.3 (*)    HCT 28.2 (*)    RDW 17.6 (*)    Monocytes Absolute 1.2 (*)    All other components within normal limits  BRAIN NATRIURETIC PEPTIDE - Abnormal; Notable for the following:    B Natriuretic Peptide 1,475.0 (*)    All other components within normal limits  BLOOD GAS, VENOUS - Abnormal; Notable for the following:    pH, Ven 7.49 (*)    Bicarbonate 36.6 (*)    Acid-Base Excess 11.9 (*)    All other components within normal limits  CULTURE, BLOOD (ROUTINE X 2)  CULTURE, BLOOD (ROUTINE X 2)  LACTIC ACID, PLASMA  COMPREHENSIVE METABOLIC PANEL  TROPONIN I   ____________________________________________  EKG  ED ECG REPORT I, Eula Listen, the attending physician, personally viewed and  interpreted this ECG.   Date: 01/18/2017  EKG Time: 2038  Rate: 74  Rhythm: normal sinus rhythm  Axis: leftward  Intervals:none  ST&T Change: No STEMI  ____________________________________________  RADIOLOGY  Dg Chest 2 View  Result Date: 01/18/2017 CLINICAL DATA:  Shortness of breath and cough for 4 days. EXAM: CHEST  2 VIEW COMPARISON:  11/09/2016 and prior radiographs. FINDINGS: Cardiomegaly and pulmonary vascular congestion noted. Mild bibasilar atelectasis/ scarring and elevated right hemidiaphragm again noted. There is no evidence of pleural effusion or pneumothorax. No acute bony abnormalities are noted. IMPRESSION: Cardiomegaly and pulmonary vascular congestion. Electronically Signed   By: Margarette Canada M.D.   On: 01/18/2017 21:01    ____________________________________________   PROCEDURES  Procedure(s) performed: None  Procedures  Critical Care performed: Yes, see critical care note(s) ____________________________________________   INITIAL IMPRESSION / ASSESSMENT AND PLAN / ED COURSE  Pertinent labs & imaging results that were available during my care of the patient were  reviewed by me and considered in my medical decision making (see chart for details).  71 y.o. female with a history of ESRD on HD, previous oxygen use that was self discontinued presenting with progressively worsening exertional shortness of breath, orthopnea, and now cough. Overall, the patient has a temperature of 37.3 here, but I am concerned about possible bacterial or viral pulmonary infection or pneumonia. CHF is also possible etiology, as well as recurrence of her multiple myeloma. Also possible is fluid overload from her end-stage renal disease, that is not being adequately addressed during her dialysis sessions. We'll treat the patient with IV antibiotics given her profound hypoxia to cover for any infection, and plan admission to the hospital at this time.  ----------------------------------------- 11:20 PM on 01/18/2017 -----------------------------------------  At this time, the patient's evolving clinical picture and laboratory results are most consistent with new onset CHF. The patient has an echo from 4/18, which did show a normal ejection fraction, but today her the NP is greater than 1400, and her chest x-ray is consistent with edema. Her blood cell count is normal her lactic acid is normal, and she is not febrile. This points away from infection, but she has been covered for pneumonia and case due to the change in her sputum. At this time the patient is stable for admission.  CRITICAL CARE Performed by: Eula Listen   Total critical care time: 35 minutes  Critical care time was exclusive of separately billable procedures and treating other patients.  Critical care was necessary to treat or prevent imminent or life-threatening deterioration.  Critical care was time spent personally by me on the following activities: development of treatment plan with patient and/or surrogate as well as nursing, discussions with consultants, evaluation of patient's response to treatment,  examination of patient, obtaining history from patient or surrogate, ordering and performing treatments and interventions, ordering and review of laboratory studies, ordering and review of radiographic studies, pulse oximetry and re-evaluation of patient's condition.  ____________________________________________  FINAL CLINICAL IMPRESSION(S) / ED DIAGNOSES  Final diagnoses:  Hypoxia  Cough  New onset of congestive heart failure ([place])         NEW MEDICATIONS STARTED DURING THIS VISIT:  New Prescriptions   No medications on file      Eula Listen, MD 01/18/17 2321

## 2017-01-18 NOTE — ED Notes (Signed)
Date and time results received: 01/18/17 2301 (use smartphrase ".now" to insert current time)  Test:Trop I Critical Value: 0.05  Name of Provider Notified: Mariea Clonts  Orders Received? Or Actions Taken?: admin ASA

## 2017-01-18 NOTE — ED Notes (Signed)
Pt's O2 sats were 88-89% on RA when checked in ED Rm 12 with pulse ox attached to RIGHT earlobe.  Pt's O2 sats improved to 96% on 2L O2 via [place].

## 2017-01-18 NOTE — ED Triage Notes (Signed)
Pt presents to ED via POV with c/o SHOB and productive cough x 3-4 days. Pt denies any c/o pain. Cough has been productive with clear/yellow mucus. Pt is a dialysis pt (M-W-F) and reports dialysis was performed today as scheduled.

## 2017-01-18 NOTE — ED Notes (Signed)
Pt is sniffling sinus drainage into her throat and coughing mucous until she gags and vomits.

## 2017-01-19 ENCOUNTER — Encounter: Payer: Self-pay | Admitting: *Deleted

## 2017-01-19 DIAGNOSIS — I132 Hypertensive heart and chronic kidney disease with heart failure and with stage 5 chronic kidney disease, or end stage renal disease: Secondary | ICD-10-CM | POA: Diagnosis not present

## 2017-01-19 LAB — BASIC METABOLIC PANEL
ANION GAP: 7 (ref 5–15)
BUN: 24 mg/dL — AB (ref 6–20)
CALCIUM: 8.9 mg/dL (ref 8.9–10.3)
CO2: 33 mmol/L — ABNORMAL HIGH (ref 22–32)
Chloride: 96 mmol/L — ABNORMAL LOW (ref 101–111)
Creatinine, Ser: 6.33 mg/dL — ABNORMAL HIGH (ref 0.44–1.00)
GFR calc Af Amer: 7 mL/min — ABNORMAL LOW (ref >60)
GFR, EST NON AFRICAN AMERICAN: 6 mL/min — AB (ref >60)
Glucose, Bld: 90 mg/dL (ref 65–99)
Potassium: 4.1 mmol/L (ref 3.5–5.1)
Sodium: 136 mmol/L (ref 135–145)

## 2017-01-19 LAB — CBC
HCT: 27.2 % — ABNORMAL LOW (ref 35.0–47.0)
HEMOGLOBIN: 9 g/dL — AB (ref 12.0–16.0)
MCH: 30.6 pg (ref 26.0–34.0)
MCHC: 33.3 g/dL (ref 32.0–36.0)
MCV: 92 fL (ref 80.0–100.0)
Platelets: 215 10*3/uL (ref 150–440)
RBC: 2.95 MIL/uL — ABNORMAL LOW (ref 3.80–5.20)
RDW: 17.2 % — AB (ref 11.5–14.5)
WBC: 8 10*3/uL (ref 3.6–11.0)

## 2017-01-19 LAB — MRSA PCR SCREENING: MRSA BY PCR: POSITIVE — AB

## 2017-01-19 MED ORDER — SODIUM CHLORIDE 0.9% FLUSH: 3.0000 mL | INTRAVENOUS | Status: DC | PRN

## 2017-01-19 MED ORDER — SEVELAMER CARBONATE 800 MG PO TABS
800.0000 mg | ORAL_TABLET | Freq: Four times a day (QID) | ORAL | Status: DC
  Administered 2017-01-19 – 2017-01-20 (×4): 800 mg via ORAL
  Filled 2017-01-19 (×5): qty 1

## 2017-01-19 MED ORDER — HEPARIN SODIUM (PORCINE) 5000 UNIT/ML IJ SOLN
5000.0000 [IU] | Freq: Three times a day (TID) | INTRAMUSCULAR | Status: DC
  Administered 2017-01-19 – 2017-01-20 (×5): 5000 [IU] via SUBCUTANEOUS
  Filled 2017-01-19 (×5): qty 1

## 2017-01-19 MED ORDER — ATORVASTATIN CALCIUM 20 MG PO TABS
40.0000 mg | ORAL_TABLET | Freq: Every day | ORAL | Status: DC
  Administered 2017-01-19 – 2017-01-20 (×2): 40 mg via ORAL
  Filled 2017-01-19 (×2): qty 2

## 2017-01-19 MED ORDER — CINACALCET HCL 30 MG PO TABS
60.0000 mg | ORAL_TABLET | Freq: Two times a day (BID) | ORAL | Status: DC
  Administered 2017-01-20: 60 mg via ORAL
  Filled 2017-01-19 (×3): qty 2

## 2017-01-19 MED ORDER — ONDANSETRON HCL 4 MG/2ML IJ SOLN: 4.0000 mg | Freq: Four times a day (QID) | INTRAMUSCULAR | Status: DC | PRN

## 2017-01-19 MED ORDER — ACETAMINOPHEN 650 MG RE SUPP: 650.0000 mg | Freq: Four times a day (QID) | RECTAL | Status: DC | PRN

## 2017-01-19 MED ORDER — AMLODIPINE BESYLATE 10 MG PO TABS
10.0000 mg | ORAL_TABLET | Freq: Every day | ORAL | Status: DC
  Administered 2017-01-19: 10 mg via ORAL
  Filled 2017-01-19: qty 2

## 2017-01-19 MED ORDER — SODIUM CHLORIDE 0.9% FLUSH
3.0000 mL | Freq: Two times a day (BID) | INTRAVENOUS | Status: DC
  Administered 2017-01-19 – 2017-01-20 (×2): 3 mL via INTRAVENOUS

## 2017-01-19 MED ORDER — IRBESARTAN 150 MG PO TABS
300.0000 mg | ORAL_TABLET | Freq: Every day | ORAL | Status: DC
  Administered 2017-01-19 – 2017-01-20 (×2): 300 mg via ORAL
  Filled 2017-01-19 (×2): qty 2

## 2017-01-19 MED ORDER — CARVEDILOL 25 MG PO TABS
25.0000 mg | ORAL_TABLET | Freq: Two times a day (BID) | ORAL | Status: DC
  Administered 2017-01-19: 25 mg via ORAL
  Filled 2017-01-19 (×2): qty 1

## 2017-01-19 MED ORDER — MIRTAZAPINE 15 MG PO TABS
15.0000 mg | ORAL_TABLET | Freq: Every day | ORAL | Status: DC
  Administered 2017-01-19: 15 mg via ORAL
  Filled 2017-01-19 (×2): qty 1

## 2017-01-19 MED ORDER — ACETAMINOPHEN 325 MG PO TABS
650.0000 mg | ORAL_TABLET | Freq: Four times a day (QID) | ORAL | Status: DC | PRN
  Administered 2017-01-20: 650 mg via ORAL

## 2017-01-19 MED ORDER — CHLORHEXIDINE GLUCONATE CLOTH 2 % EX PADS
6.0000 | MEDICATED_PAD | Freq: Every day | CUTANEOUS | Status: DC
  Administered 2017-01-19 – 2017-01-20 (×2): 6 via TOPICAL

## 2017-01-19 MED ORDER — ASPIRIN EC 81 MG PO TBEC
81.0000 mg | DELAYED_RELEASE_TABLET | Freq: Every day | ORAL | Status: DC
  Administered 2017-01-19 – 2017-01-20 (×2): 81 mg via ORAL
  Filled 2017-01-19 (×2): qty 1

## 2017-01-19 MED ORDER — ONDANSETRON HCL 4 MG PO TABS: 4.0000 mg | ORAL_TABLET | Freq: Four times a day (QID) | ORAL | Status: DC | PRN

## 2017-01-19 MED ORDER — MUPIROCIN 2 % EX OINT
1.0000 "application " | TOPICAL_OINTMENT | Freq: Two times a day (BID) | CUTANEOUS | Status: DC
  Administered 2017-01-19 – 2017-01-20 (×3): 1 via NASAL
  Filled 2017-01-19: qty 22

## 2017-01-19 NOTE — Care Management (Signed)
Patient placed in observation for shortness of breath.  Chronic dialysis M W F.  Notified [name] with [facility]. Current need for supplemental oxygen is acute .  Discussed need for home oxygen assessment during progression

## 2017-01-19 NOTE — Progress Notes (Signed)
Pre hd assessment  

## 2017-01-19 NOTE — H&P (Signed)
[place] at [place] NAME: Cassandra Joseph    MR#:  [id number]  DATE OF BIRTH:  1946-01-29  DATE OF ADMISSION:  01/18/2017  PRIMARY CARE PHYSICIAN: Tracie Harrier, MD   REQUESTING/REFERRING PHYSICIAN: Mariea Clonts, MD  CHIEF COMPLAINT:   Chief Complaint  Patient presents with  . Shortness of Breath    HISTORY OF PRESENT ILLNESS:  Cassandra Joseph  is a 71 y.o. female who presents with Progressive shortness of breath over the last 3 days. Patient states she feels like they may not been pulling off enough fluid at dialysis. She states that for the past 2 nights she's had to sit upright, and last night she could not sleep at all due to her shortness of breath. She also states that at some point in the past she was on oxygen and thinks she may need that again, but she feels like her current symptoms are more acute. Initial workup in the ED shows likely heart failure exacerbation, hospitalists were called for admission  PAST MEDICAL HISTORY:   Past Medical History:  Diagnosis Date  . 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 ESRD (end stage renal disease) ([place])    a. on HD x 11 years; b. felt to be 2/2 HTN; c. HD TTS  . History of nuclear stress test    a. 08/2015: probably normal myocardial perfusion study, no evidence for significant ischemia or scar was noted, during stress, global systolic function normal, EF 54%, RV borderline dilatation, coronary artery calcifications and marked mitral annular calcifications were noted, liver calcifications were noted on attenuation CT scan likely represent sequelae of previous granulomatous disease  . HLD (hyperlipidemia)   . Hypertension   . Ischemic stroke ([place])    a. 03/2014  . Metabolic syndrome   . Morbid obesity ([place])   . Multiple myeloma (H[facility])    a. in remission  . Osteomyelitis ([place])    a. 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 Rheumatoid arthritis ([place])   . TIA (transient ischemic attack)     a. 2014    PAST SURGICAL HISTORY:   Past Surgical History:  Procedure Laterality Date  . ABDOMINAL HYSTERECTOMY    . APPENDECTOMY    . eyelid surgery    . HERNIA REPAIR    . right knee surgery      SOCIAL HISTORY:   Social History  Substance Use Topics  . Smoking status: Former Research scientist (life sciences)  . Smokeless tobacco: Never Used     Comment: quit in 2007  . Alcohol use No    FAMILY HISTORY:   Family History  Problem Relation Age of Onset  . Seizures Mother   . Hypertension Mother   . Throat cancer Father   . Breast cancer Neg Hx     DRUG ALLERGIES:   Allergies  Allergen Reactions  . Shellfish Allergy     MEDICATIONS AT HOME:   Prior to Admission medications   Medication Sig Start Date End Date Taking? Authorizing Provider  amLODipine (NORVASC) 10 MG tablet Take 10 mg by mouth daily. 09/27/16   [provider]  aspirin EC 81 MG tablet Take 81 mg by mouth daily.    [provider]  atorvastatin (LIPITOR) 40 MG tablet Take 40 mg by mouth daily. 03/31/14   [provider]  b complex-vitamin c-folic acid (NEPHRO-VITE) 0.8 MG TABS tablet Take 1 tablet by mouth daily. 09/27/16   [provider]  carvedilol (COREG) 25 MG tablet Take 25 mg by mouth 2 (two) times daily. 06/26/13   [provider]  cinacalcet (SENSIPAR) 60 MG tablet Take 60 mg by mouth 2 (two) times daily.    [provider]  irbesartan (AVAPRO) 300 MG tablet Take 300 mg by mouth daily. 09/16/16 09/16/17  [provider]  loratadine (CLARITIN) 10 MG tablet Take 10 mg by mouth daily. 09/27/16   [provider]  meloxicam (MOBIC) 7.5 MG tablet Take 7.5 mg by mouth daily. 10/11/16   [provider]  mirtazapine (REMERON) 15 MG tablet Take 15 mg by mouth at bedtime. 10/11/16   [provider]  sevelamer carbonate (RENVELA) 800 MG tablet Take 800 mg by mouth 4 (four) times daily.    [provider]    REVIEW OF SYSTEMS:  Review of  Systems  Constitutional: Negative for chills, fever, malaise/fatigue and weight loss.  HENT: Negative for ear pain, hearing loss and tinnitus.   Eyes: Negative for blurred vision, double vision, pain and redness.  Respiratory: Positive for shortness of breath. Negative for cough and hemoptysis.   Cardiovascular: Positive for orthopnea. Negative for chest pain, palpitations and leg swelling.  Gastrointestinal: Negative for abdominal pain, constipation, diarrhea, nausea and vomiting.  Genitourinary: Negative for dysuria, frequency and hematuria.  Musculoskeletal: Negative for back pain, joint pain and neck pain.  Skin:       No acne, rash, or lesions  Neurological: Negative for dizziness, tremors, focal weakness and weakness.  Endo/Heme/Allergies: Negative for polydipsia. Does not bruise/bleed easily.  Psychiatric/Behavioral: Negative for depression. The patient is not nervous/anxious and does not have insomnia.      VITAL SIGNS:   Vitals:   01/18/17 2200 01/18/17 2215 01/18/17 2230 01/18/17 2245  BP: 133/64 (!) 113/48 (!) 125/53 (!) 132/58  Pulse: 71 70 71 70  Resp: (!) 21 (!) 27 (!) 26 (!) 22  Temp:      TempSrc:      SpO2: 95% 95% 98% 97%  Weight:      Height:       Wt Readings from Last 3 Encounters:  01/18/17 122.5 kg (270 lb)  11/09/16 123 kg (271 lb 1.6 oz)    PHYSICAL EXAMINATION:  Physical Exam  Vitals reviewed. Constitutional: She is oriented to person, place, and time. She appears well-developed and well-nourished. No distress.  HENT:  Head: Normocephalic and atraumatic.  Mouth/Throat: Oropharynx is clear and moist.  Eyes: Conjunctivae and EOM are normal. Pupils are equal, round, and reactive to light. No scleral icterus.  Neck: Normal range of motion. Neck supple. No JVD present. No thyromegaly present.  Cardiovascular: Normal rate, regular rhythm and intact distal pulses.  Exam reveals no gallop and no friction rub.   No murmur heard. Respiratory: Effort  normal. No respiratory distress. She has no wheezes. She has rales.  GI: Soft. Bowel sounds are normal. She exhibits no distension. There is no tenderness.  Musculoskeletal: Normal range of motion. She exhibits no edema.  No arthritis, no gout  Lymphadenopathy:    She has no cervical adenopathy.  Neurological: She is alert and oriented to person, place, and time. No cranial nerve deficit.  No dysarthria, no aphasia  Skin: Skin is warm and dry. No rash noted. No erythema.  Psychiatric: She has a normal mood and affect. Her behavior is normal. Judgment and thought content normal.    LABORATORY PANEL:   CBC  Recent Labs Lab 01/18/17 2126  WBC 9.4  HGB  9.3*  HCT 28.2*  PLT 237   ------------------------------------------------------------------------------------------------------------------  Chemistries   Recent Labs Lab 01/18/17 2126  NA 137  K 3.9  CL 95*  CO2 33*  GLUCOSE 95  BUN 22*  CREATININE 5.82*  CALCIUM 9.4  AST 14*  ALT 8*  ALKPHOS 67  BILITOT 0.8   ------------------------------------------------------------------------------------------------------------------  Cardiac Enzymes  Recent Labs Lab 01/18/17 2126  TROPONINI 0.05*   ------------------------------------------------------------------------------------------------------------------  RADIOLOGY:  Dg Chest 2 View  Result Date: 01/18/2017 CLINICAL DATA:  Shortness of breath and cough for 4 days. EXAM: CHEST  2 VIEW COMPARISON:  11/09/2016 and prior radiographs. FINDINGS: Cardiomegaly and pulmonary vascular congestion noted. Mild bibasilar atelectasis/ scarring and elevated right hemidiaphragm again noted. There is no evidence of pleural effusion or pneumothorax. No acute bony abnormalities are noted. IMPRESSION: Cardiomegaly and pulmonary vascular congestion. Electronically Signed   By: Margarette Canada M.D.   On: 01/18/2017 21:01    EKG:   Orders placed or performed during the hospital encounter  of 01/18/17  . ED EKG  . ED EKG  . ED EKG 12-Lead  . ED EKG 12-Lead    IMPRESSION AND PLAN:  Principal Problem:   Acute on chronic diastolic CHF (congestive heart failure) ([place]) - Patient was given IV Lasix in the ED, but states that she does not make any urine. Oxygen has increased her O2 sats, we will continue this for now. She likely needs another dialysis session. See below. Cardiology consult Active Problems:   ESRD on hemodialysis [facility]) - nephrology consult for dialysis support, will likely need dialysis for fluid removal   Essential hypertension - continue home meds   HLD (hyperlipidemia) - continue home meds  All the records are reviewed and case discussed with ED provider. Management plans discussed with the patient and/or family.  DVT PROPHYLAXIS: SubQ heparin  GI PROPHYLAXIS: None  ADMISSION STATUS: Observation  CODE STATUS: Full Code Status History    Date Active Date Inactive Code Status Order ID Comments User Context   11/09/2016  3:03 PM 11/10/2016  6:52 PM Full Code [id number]  Gladstone Lighter, MD ED      TOTAL TIME TAKING CARE OF THIS PATIENT: 40 minutes.   Tru Rana Lockhart 01/19/2017, 12:02 AM  [facility]  Office  [phone]  CC: Primary care physician; Tracie Harrier, MD  Note:  This document was prepared using Dragon voice recognition software and may include unintentional dictation errors.

## 2017-01-19 NOTE — Progress Notes (Signed)
Post hd vitals 

## 2017-01-19 NOTE — Plan of Care (Signed)
Problem: Pain Managment: Goal: General experience of comfort will improve Outcome: Progressing No complaints of pain this shift, will continue to monitor.  Problem: Tissue Perfusion: Goal: Risk factors for ineffective tissue perfusion will decrease Outcome: Completed/Met Date Met: 01/19/17 Heparin subcutaneously for VTE

## 2017-01-19 NOTE — Care Management Obs Status (Signed)
[facility] NOTIFICATION   Patient Details  Name: Cassandra Joseph MRN: [id number] Date of Birth: 26-Nov-1945   Medicare Observation Status Notification Given:  Yes    Katrina Stack, RN 01/19/2017, 2:58 PM

## 2017-01-19 NOTE — Progress Notes (Signed)
Post hd assessment 

## 2017-01-19 NOTE — Progress Notes (Signed)
Hd start 

## 2017-01-19 NOTE — Progress Notes (Signed)
Pre hd info 

## 2017-01-19 NOTE — Progress Notes (Signed)
[facility]  ROUNDING NOTE   Subjective:   [name] admitted to [facility] on 01/18/2017 for Cough [R05] Hypoxia [R09.02] New onset of congestive heart failure ([place]) [I50.9]  Patient recently had her estimated dry weight changed. 121.5kg. She has been having two nights of orthopnea. She felt short of breath after dialysis. Brought to ED where here CXR shows pulmonary edema.   Objective:  Vital signs in last 24 hours:  Temp:  [97.8 F (36.6 C)-99.1 F (37.3 C)] 97.8 F (36.6 C) (06/14 0940) Pulse Rate:  [62-129] 63 (06/14 1020) Resp:  [18-28] 25 (06/14 1020) BP: (113-147)/(47-76) 131/63 (06/14 1020) SpO2:  [63 %-100 %] 100 % (06/14 1020) Weight:  [121.4 kg (267 lb 11.2 oz)-122.8 kg (270 lb 11.6 oz)] 122.8 kg (270 lb 11.6 oz) (06/14 0940)  Weight change:  Filed Weights   01/18/17 2025 01/19/17 0131 01/19/17 0940  Weight: 122.5 kg (270 lb) 121.4 kg (267 lb 11.2 oz) 122.8 kg (270 lb 11.6 oz)    Intake/Output: I/O last 3 completed shifts: In: 800 [IV Piggyback:800] Out: 50 [Emesis/NG output:50]   Intake/Output this shift:  No intake/output data recorded.  Physical Exam: General: NAD, laying in bed  Head: Normocephalic, atraumatic. Moist oral mucosal membranes  Eyes: Anicteric, PERRL  Neck: Supple, trachea midline  Lungs:  Bilateral basilar crackles  Heart: Regular rate and rhythm  Abdomen:  Soft, nontender, obese  Extremities: no peripheral edema.  Neurologic: Nonfocal, moving all four extremities  Skin: No lesions  Access: AVF    Basic Metabolic Panel:  Recent Labs Lab 01/18/17 2126 01/19/17 0508  NA 137 136  K 3.9 4.1  CL 95* 96*  CO2 33* 33*  GLUCOSE 95 90  BUN 22* 24*  CREATININE 5.82* 6.33*  CALCIUM 9.4 8.9    Liver Function Tests:  Recent Labs Lab 01/18/17 2126  AST 14*  ALT 8*  ALKPHOS 67  BILITOT 0.8  PROT 8.0  ALBUMIN 3.2*   No results for input(s): LIPASE, AMYLASE in the last 168 hours. No results for input(s):  AMMONIA in the last 168 hours.  CBC:  Recent Labs Lab 01/18/17 2126 01/19/17 0508  WBC 9.4 8.0  NEUTROABS 6.4  --   HGB 9.3* 9.0*  HCT 28.2* 27.2*  MCV 90.2 92.0  PLT 237 215    Cardiac Enzymes:  Recent Labs Lab 01/18/17 2126  TROPONINI 0.05*    BNP: Invalid input(s): POCBNP  CBG: No results for input(s): GLUCAP in the last 168 hours.  Microbiology: Results for orders placed or performed during the hospital encounter of 01/18/17  Blood Culture (routine x 2)     Status: None (Preliminary result)   Collection Time: 01/18/17  9:26 PM  Result Value Ref Range Status   Specimen Description BLOOD LFOA  Final   Special Requests   Final    BOTTLES DRAWN AEROBIC AND ANAEROBIC Blood Culture results may not be optimal due to an excessive volume of blood received in culture bottles   Culture NO GROWTH < 12 HOURS  Final   Report Status PENDING  Incomplete  Blood Culture (routine x 2)     Status: None (Preliminary result)   Collection Time: 01/18/17  9:28 PM  Result Value Ref Range Status   Specimen Description BLOOD BLLA  Final   Special Requests   Final    BOTTLES DRAWN AEROBIC AND ANAEROBIC Blood Culture adequate volume   Culture NO GROWTH < 12 HOURS  Final   Report Status  PENDING  Incomplete  MRSA PCR Screening     Status: Abnormal   Collection Time: 01/19/17  1:40 AM  Result Value Ref Range Status   MRSA by PCR POSITIVE (A) NEGATIVE Final    Comment:        The GeneXpert MRSA Assay (FDA approved for NASAL specimens only), is one component of a comprehensive MRSA colonization surveillance program. It is not intended to diagnose MRSA infection nor to guide or monitor treatment for MRSA infections. RESULT CALLED TO, READ BACK BY AND VERIFIED WITH: KAT MURRAY ON 01/19/17 AT 0334 QSD     Coagulation Studies: No results for input(s): LABPROT, INR in the last 72 hours.  Urinalysis: No results for input(s): COLORURINE, LABSPEC, PHURINE, GLUCOSEU, HGBUR,  BILIRUBINUR, KETONESUR, PROTEINUR, UROBILINOGEN, NITRITE, LEUKOCYTESUR in the last 72 hours.  Invalid input(s): APPERANCEUR    Imaging: Dg Chest 2 View  Result Date: 01/18/2017 CLINICAL DATA:  Shortness of breath and cough for 4 days. EXAM: CHEST  2 VIEW COMPARISON:  11/09/2016 and prior radiographs. FINDINGS: Cardiomegaly and pulmonary vascular congestion noted. Mild bibasilar atelectasis/ scarring and elevated right hemidiaphragm again noted. There is no evidence of pleural effusion or pneumothorax. No acute bony abnormalities are noted. IMPRESSION: Cardiomegaly and pulmonary vascular congestion. Electronically Signed   By: Margarette Canada M.D.   On: 01/18/2017 21:01     Medications:    . amLODipine  10 mg Oral Daily  . aspirin EC  81 mg Oral Daily  . atorvastatin  40 mg Oral Daily  . carvedilol  25 mg Oral BID  . Chlorhexidine Gluconate Cloth  6 each Topical Q0600  . cinacalcet  60 mg Oral BID  . furosemide  60 mg Intravenous Once  . heparin  5,000 Units Subcutaneous Q8H  . irbesartan  300 mg Oral Daily  . mirtazapine  15 mg Oral QHS  . mupirocin ointment  1 application Nasal BID  . sevelamer carbonate  800 mg Oral QID   acetaminophen **OR** acetaminophen, ondansetron **OR** ondansetron (ZOFRAN) IV  Assessment/ Plan:  [name] is a 71 y.o. black female with End Stage Renal Disease on hemodialysis, rheumatoid arthritis, TIA/CVA, congestive heart failure, multiple myeloma, hypertension, hyperlipidemia  MWF [facility] [place].   1. End Stage Renal Disease: with pulmonary edema: extra treatment today. Goal to get weight to 121.5kg.  - Dialysis today and then tomorrow.   2. Hypertension: blood pressure at goal.  - amlodipine, carvedilol, furosemide, irbesartan  3. Secondary Hyperparathyroidism: - sevelamer  4. Anemia of chronic kidney disease: hemoglobin 9 - epo on MWF   LOS: [place], [place] 6/14/201811:07 AM

## 2017-01-19 NOTE — Progress Notes (Signed)
  End of hd 

## 2017-01-19 NOTE — Progress Notes (Signed)
[place] at [place] NAME: Cassandra Joseph    MR#:  [id number]  DATE OF BIRTH:  [birth year]  SUBJECTIVE:  CHIEF COMPLAINT:   Chief Complaint  Patient presents with  . Shortness of Breath    Came with SOB, Had CHF, Does not make any urine. Seen in HD.  REVIEW OF SYSTEMS:  CONSTITUTIONAL: No fever, fatigue or weakness.  EYES: No blurred or double vision.  EARS, NOSE, AND THROAT: No tinnitus or ear pain.  RESPIRATORY: No cough, shortness of breath, wheezing or hemoptysis.  CARDIOVASCULAR: No chest pain, orthopnea, edema.  GASTROINTESTINAL: No nausea, vomiting, diarrhea or abdominal pain.  GENITOURINARY: No dysuria, hematuria.  ENDOCRINE: No polyuria, nocturia,  HEMATOLOGY: No anemia, easy bruising or bleeding SKIN: No rash or lesion. MUSCULOSKELETAL: No joint pain or arthritis.   NEUROLOGIC: No tingling, numbness, weakness.  PSYCHIATRY: No anxiety or depression.   ROS  DRUG ALLERGIES:   Allergies  Allergen Reactions  . Shellfish Allergy     VITALS:  Blood pressure 120/63, pulse 63, temperature 97.8 F (36.6 C), temperature source Oral, resp. rate 20, height 5\' 6"  (1.676 m), weight 122.8 kg (270 lb 11.6 oz), SpO2 100 %.  PHYSICAL EXAMINATION:  GENERAL:  71 y.o.-year-old patient lying in the bed with no acute distress.  EYES: Pupils equal, round, reactive to light and accommodation. No scleral icterus. Extraocular muscles intact.  HEENT: Head atraumatic, normocephalic. Oropharynx and nasopharynx clear.  NECK:  Supple, no jugular venous distention. No thyroid enlargement, no tenderness.  LUNGS: Normal breath sounds bilaterally, no wheezing, have some crepitation. No use of accessory muscles of respiration.  CARDIOVASCULAR: S1, S2 normal. No murmurs, rubs, or gallops.  ABDOMEN: Soft, nontender, nondistended. Bowel sounds present. No organomegaly or mass.  EXTREMITIES: No pedal edema, cyanosis, or clubbing. Right arm AV fistula  present. NEUROLOGIC: Cranial nerves II through XII are intact. Muscle strength 5/5 in all extremities. Sensation intact. Gait not checked.  PSYCHIATRIC: The patient is alert and oriented x 3.  SKIN: No obvious rash, lesion, or ulcer.   Physical Exam LABORATORY PANEL:   CBC  Recent Labs Lab 01/19/17 0508  WBC 8.0  HGB 9.0*  HCT 27.2*  PLT 215   ------------------------------------------------------------------------------------------------------------------  Chemistries   Recent Labs Lab 01/18/17 2126 01/19/17 0508  NA 137 136  K 3.9 4.1  CL 95* 96*  CO2 33* 33*  GLUCOSE 95 90  BUN 22* 24*  CREATININE 5.82* 6.33*  CALCIUM 9.4 8.9  AST 14*  --   ALT 8*  --   ALKPHOS 67  --   BILITOT 0.8  --    ------------------------------------------------------------------------------------------------------------------  Cardiac Enzymes  Recent Labs Lab 01/18/17 2126  TROPONINI 0.05*   ------------------------------------------------------------------------------------------------------------------  RADIOLOGY:  Dg Chest 2 View  Result Date: 01/18/2017 CLINICAL DATA:  Shortness of breath and cough for 4 days. EXAM: CHEST  2 VIEW COMPARISON:  11/09/2016 and prior radiographs. FINDINGS: Cardiomegaly and pulmonary vascular congestion noted. Mild bibasilar atelectasis/ scarring and elevated right hemidiaphragm again noted. There is no evidence of pleural effusion or pneumothorax. No acute bony abnormalities are noted. IMPRESSION: Cardiomegaly and pulmonary vascular congestion. Electronically Signed   By: Margarette Canada M.D.   On: 01/18/2017 21:01    ASSESSMENT AND PLAN:   Principal Problem:   Acute on chronic diastolic CHF (congestive heart failure) (HCC) Active Problems:   ESRD on hemodialysis ([place])   Essential hypertension   HLD (hyperlipidemia)  *  Acute on chronic diastolic CHF (  congestive heart failure) ([place]) - Patient was given IV Lasix in the ED, but states that she  does not make any urine. Oxygen has increased her O2 sats,  As per nephrology 2 days on HD back to [facility]. *  ESRD on hemodialysis [facility]) - nephrology consult for dialysis support *  Essential hypertension - continue home meds *  HLD (hyperlipidemia) - continue home meds    All the records are reviewed and case discussed with Care Management/Social Workerr. Management plans discussed with the patient, family and they are in agreement.  CODE STATUS: Full  TOTAL TIME TAKING CARE OF THIS PATIENT: 35 minutes.     POSSIBLE D/C IN 1-2 DAYS, DEPENDING ON CLINICAL CONDITION.   Vaughan Basta M.D on 01/19/2017   Between 7am to 6pm - Pager - [phone]  After 6pm go to [email or web address] - password [email or web address] [place]  Office  [phone]  CC: Primary care physician; Tracie Harrier, MD  Note: This dictation was prepared with Dragon dictation along with smaller phrase technology. Any transcriptional errors that result from this process are unintentional.

## 2017-01-20 DIAGNOSIS — I132 Hypertensive heart and chronic kidney disease with heart failure and with stage 5 chronic kidney disease, or end stage renal disease: Secondary | ICD-10-CM | POA: Diagnosis not present

## 2017-01-20 LAB — HEPATITIS B SURFACE ANTIGEN: Hepatitis B Surface Ag: NEGATIVE

## 2017-01-20 LAB — RENAL FUNCTION PANEL
ALBUMIN: 2.9 g/dL — AB (ref 3.5–5.0)
Anion gap: 7 (ref 5–15)
BUN: 26 mg/dL — AB (ref 6–20)
CO2: 30 mmol/L (ref 22–32)
CREATININE: 4.8 mg/dL — AB (ref 0.44–1.00)
Calcium: 9.2 mg/dL (ref 8.9–10.3)
Chloride: 101 mmol/L (ref 101–111)
GFR, EST AFRICAN AMERICAN: 10 mL/min — AB (ref >60)
GFR, EST NON AFRICAN AMERICAN: 8 mL/min — AB (ref >60)
Glucose, Bld: 101 mg/dL — ABNORMAL HIGH (ref 65–99)
PHOSPHORUS: 4.1 mg/dL (ref 2.5–4.6)
POTASSIUM: 4.1 mmol/L (ref 3.5–5.1)
Sodium: 138 mmol/L (ref 135–145)

## 2017-01-20 LAB — CBC
HCT: 26.7 % — ABNORMAL LOW (ref 35.0–47.0)
HEMOGLOBIN: 8.8 g/dL — AB (ref 12.0–16.0)
MCH: 29.9 pg (ref 26.0–34.0)
MCHC: 33 g/dL (ref 32.0–36.0)
MCV: 90.6 fL (ref 80.0–100.0)
Platelets: 218 10*3/uL (ref 150–440)
RBC: 2.94 MIL/uL — AB (ref 3.80–5.20)
RDW: 17 % — ABNORMAL HIGH (ref 11.5–14.5)
WBC: 5.3 10*3/uL (ref 3.6–11.0)

## 2017-01-20 LAB — HEPATITIS B CORE ANTIBODY, TOTAL: Hep B Core Total Ab: NEGATIVE

## 2017-01-20 LAB — HEPATITIS B SURFACE ANTIBODY,QUALITATIVE: HEP B S AB: REACTIVE

## 2017-01-20 MED ORDER — ATORVASTATIN CALCIUM 40 MG PO TABS: 40.0000 mg | ORAL_TABLET | Freq: Every day | ORAL | 0 refills | Status: DC

## 2017-01-20 MED ORDER — EPOETIN ALFA 10000 UNIT/ML IJ SOLN
10000.0000 [IU] | INTRAMUSCULAR | Status: DC
  Administered 2017-01-20: 10000 [IU] via INTRAVENOUS

## 2017-01-20 NOTE — Progress Notes (Signed)
Post hd vitals 

## 2017-01-20 NOTE — Progress Notes (Signed)
Post hd assessment 

## 2017-01-20 NOTE — Progress Notes (Signed)
SATURATION QUALIFICATIONS: (This note is used to comply with regulatory documentation for home oxygen)  Patient Saturations on Room Air at Rest = 92% Patient Saturations on Room Air while Ambulating = 92-95%  Patient Saturations on 1 Liters of oxygen while Ambulating =95%  Please briefly explain why patient needs home oxygen: Not needed.

## 2017-01-20 NOTE — Progress Notes (Signed)
Pnt resting in bed. This RN initiated CHG bath, pnt requested to wait "a few minutes". Pnt reports "I don't fell so well". Pnt                                     states she started feeling "not so good" this AM. Asked pnt to explain if she was in pain or increased SOB. Pnt denied any increased SOB or pain, she just overall felt "not good". Vs's stable no significant changes in assessment at this time. Advised pnt to let staff know when she was ready to start bath. No other issues or concerns at this time. Bed low, locked and call bell in reach. Will continue to monitor and assess.

## 2017-01-20 NOTE — Progress Notes (Signed)
This RN has assumed care of pnt. Pnt appears comfortable and asleep in bed. No issues or concerns at this time. Bed low and locked. Call bell in reach. Will continue to monitor and assess.

## 2017-01-20 NOTE — Progress Notes (Signed)
PRE DIALYSIS ASSESSMENT 

## 2017-01-20 NOTE — Progress Notes (Signed)
Discharged to home with a friend.  Prescriptions given.  Follow up appointment explained.

## 2017-01-20 NOTE — Progress Notes (Signed)
Nutrition Brief Note  Patient identified on the Malnutrition Screening Tool (MST) Report  Wt Readings from Last 15 Encounters:  01/20/17 268 lb 4.8 oz (121.7 kg)  11/09/16 271 lb 1.6 oz (123 kg)    Body mass index is 43.3 kg/m. Patient meets criteria for obese class III based on current BMI.   Current diet order is heart healthy, patient is consuming approximately 100% of meals at this time. Labs and medications reviewed.   Patient reports recent weight loss, but no evidence thereof, weight seems to fluctuate between 268-272# Reports good appetite. Ate an egg omelet and a bagel this morning.   No nutrition interventions warranted at this time. If nutrition issues arise, please consult RD.   Cassandra Anis. Cassandra Bently, MS, RD LDN Inpatient Clinical Dietitian Pager [phone]

## 2017-01-20 NOTE — Progress Notes (Signed)
HD STARTED  

## 2017-01-20 NOTE — Plan of Care (Signed)
Problem: Health Behavior/Discharge Planning: Goal: Ability to manage health-related needs will improve Outcome: Completed/Met Date Met: 01/20/17 [place] has returned to normal

## 2017-01-20 NOTE — Progress Notes (Signed)
  End of hd 

## 2017-01-20 NOTE — Progress Notes (Signed)
[facility]  ROUNDING NOTE   Subjective:   Seen and examined on hemodialysis. Tolerating treatment well. UF 1.5 kg.     HEMODIALYSIS FLOWSHEET:  Blood Flow Rate (mL/min): 400 mL/min Arterial Pressure (mmHg): -160 mmHg Venous Pressure (mmHg): 180 mmHg Transmembrane Pressure (mmHg): 60 mmHg Ultrafiltration Rate (mL/min): 670 mL/min Dialysate Flow Rate (mL/min): 600 ml/min Conductivity: Machine : 13.6 Conductivity: Machine : 13.6 Dialysis Fluid Bolus: Normal Saline Bolus Amount (mL): 250 mL Dialysate Change:  (3k)    Objective:  Vital signs in last 24 hours:  Temp:  [97.6 F (36.4 C)-98.3 F (36.8 C)] 98.3 F (36.8 C) (06/15 1040) Pulse Rate:  [59-69] 60 (06/15 1230) Resp:  [15-24] 18 (06/15 1230) BP: (111-164)/(31-99) 154/72 (06/15 1230) SpO2:  [92 %-100 %] 100 % (06/15 1200) Weight:  [120.5 kg (265 lb 11.2 oz)-121.7 kg (268 lb 4.8 oz)] 121.7 kg (268 lb 4.8 oz) (06/15 1040)  Weight change: 0.329 kg (11.6 oz) Filed Weights   01/19/17 0940 01/20/17 0428 01/20/17 1040  Weight: 122.8 kg (270 lb 11.6 oz) 120.5 kg (265 lb 11.2 oz) 121.7 kg (268 lb 4.8 oz)    Intake/Output: I/O last 3 completed shifts: In: 800 [IV Piggyback:800] Out: 1550 [Emesis/NG output:50; Other:1500]   Intake/Output this shift:  No intake/output data recorded.  Physical Exam: General: NAD, laying in bed  Head: Normocephalic, atraumatic. Moist oral mucosal membranes  Eyes: Anicteric, PERRL  Neck: Supple, trachea midline  Lungs:  clear  Heart: Regular rate and rhythm  Abdomen:  Soft, nontender, obese  Extremities: no peripheral edema.  Neurologic: Nonfocal, moving all four extremities  Skin: No lesions  Access: AVF    Basic Metabolic Panel:  Recent Labs Lab 01/18/17 2126 01/19/17 0508 01/20/17 1050  NA 137 136 138  K 3.9 4.1 4.1  CL 95* 96* 101  CO2 33* 33* 30  GLUCOSE 95 90 101*  BUN 22* 24* 26*  CREATININE 5.82* 6.33* 4.80*  CALCIUM 9.4 8.9 9.2  PHOS  --   --   4.1    Liver Function Tests:  Recent Labs Lab 01/18/17 2126 01/20/17 1050  AST 14*  --   ALT 8*  --   ALKPHOS 67  --   BILITOT 0.8  --   PROT 8.0  --   ALBUMIN 3.2* 2.9*   No results for input(s): LIPASE, AMYLASE in the last 168 hours. No results for input(s): AMMONIA in the last 168 hours.  CBC:  Recent Labs Lab 01/18/17 2126 01/19/17 0508 01/20/17 1050  WBC 9.4 8.0 5.3  NEUTROABS 6.4  --   --   HGB 9.3* 9.0* 8.8*  HCT 28.2* 27.2* 26.7*  MCV 90.2 92.0 90.6  PLT 237 215 218    Cardiac Enzymes:  Recent Labs Lab 01/18/17 2126  TROPONINI 0.05*    BNP: Invalid input(s): POCBNP  CBG: No results for input(s): GLUCAP in the last 168 hours.  Microbiology: Results for orders placed or performed during the hospital encounter of 01/18/17  Blood Culture (routine x 2)     Status: None (Preliminary result)   Collection Time: 01/18/17  9:26 PM  Result Value Ref Range Status   Specimen Description BLOOD LFOA  Final   Special Requests   Final    BOTTLES DRAWN AEROBIC AND ANAEROBIC Blood Culture results may not be optimal due to an excessive volume of blood received in culture bottles   Culture NO GROWTH 2 DAYS  Final   Report Status PENDING  Incomplete  Blood  Culture (routine x 2)     Status: None (Preliminary result)   Collection Time: 01/18/17  9:28 PM  Result Value Ref Range Status   Specimen Description BLOOD BLLA  Final   Special Requests   Final    BOTTLES DRAWN AEROBIC AND ANAEROBIC Blood Culture adequate volume   Culture NO GROWTH 2 DAYS  Final   Report Status PENDING  Incomplete  MRSA PCR Screening     Status: Abnormal   Collection Time: 01/19/17  1:40 AM  Result Value Ref Range Status   MRSA by PCR POSITIVE (A) NEGATIVE Final    Comment:        The GeneXpert MRSA Assay (FDA approved for NASAL specimens only), is one component of a comprehensive MRSA colonization surveillance program. It is not intended to diagnose MRSA infection nor to guide  or monitor treatment for MRSA infections. RESULT CALLED TO, READ BACK BY AND VERIFIED WITH: KAT MURRAY ON 01/19/17 AT 0334 QSD     Coagulation Studies: No results for input(s): LABPROT, INR in the last 72 hours.  Urinalysis: No results for input(s): COLORURINE, LABSPEC, PHURINE, GLUCOSEU, HGBUR, BILIRUBINUR, KETONESUR, PROTEINUR, UROBILINOGEN, NITRITE, LEUKOCYTESUR in the last 72 hours.  Invalid input(s): APPERANCEUR    Imaging: Dg Chest 2 View  Result Date: 01/18/2017 CLINICAL DATA:  Shortness of breath and cough for 4 days. EXAM: CHEST  2 VIEW COMPARISON:  11/09/2016 and prior radiographs. FINDINGS: Cardiomegaly and pulmonary vascular congestion noted. Mild bibasilar atelectasis/ scarring and elevated right hemidiaphragm again noted. There is no evidence of pleural effusion or pneumothorax. No acute bony abnormalities are noted. IMPRESSION: Cardiomegaly and pulmonary vascular congestion. Electronically Signed   By: Margarette Canada M.D.   On: 01/18/2017 21:01     Medications:    . amLODipine  10 mg Oral Daily  . aspirin EC  81 mg Oral Daily  . atorvastatin  40 mg Oral Daily  . carvedilol  25 mg Oral BID  . Chlorhexidine Gluconate Cloth  6 each Topical Q0600  . cinacalcet  60 mg Oral BID  . epoetin (EPOGEN/PROCRIT) injection  10,000 Units Intravenous Q M,W,F-HD  . furosemide  60 mg Intravenous Once  . heparin  5,000 Units Subcutaneous Q8H  . irbesartan  300 mg Oral Daily  . mirtazapine  15 mg Oral QHS  . mupirocin ointment  1 application Nasal BID  . sevelamer carbonate  800 mg Oral QID  . sodium chloride flush  3 mL Intravenous Q12H   acetaminophen **OR** acetaminophen, ondansetron **OR** ondansetron (ZOFRAN) IV, sodium chloride flush  Assessment/ Plan:  [name] is a 71 y.o. black female with End Stage Renal Disease on hemodialysis, rheumatoid arthritis, TIA/CVA, congestive heart failure, multiple myeloma, hypertension, hyperlipidemia  MWF [facility] Cassandra Joseph.   1. End Stage Renal Disease: with pulmonary edema: extra treatment yesterday.  Goal to get weight to 121.5kg.  - Seen and examined on hemodialysis.   2. Hypertension: blood pressure at goal.  - amlodipine, carvedilol, furosemide, irbesartan  3. Secondary Hyperparathyroidism: - sevelamer  4. Anemia of chronic kidney disease: hemoglobin 8.8 - epo on MWF   LOS: 0 [place] Luppino 6/15/201812:45 PM

## 2017-01-20 NOTE — Care Management (Signed)
Patient for discharge. Cassandra Joseph with [facility] was notified by Rollene Rotunda.  Did not qualify for home oxygen.

## 2017-01-23 LAB — CULTURE, BLOOD (ROUTINE X 2)
CULTURE: NO GROWTH
Culture: NO GROWTH
SPECIAL REQUESTS: ADEQUATE

## 2017-01-28 NOTE — Discharge Summary (Signed)
[place] [facility] [place] NAME: Cassandra Joseph    MR#:  [id number]  DATE OF BIRTH:  07-09-1946  DATE OF ADMISSION:  01/18/2017 ADMITTING PHYSICIAN: Lance Coon, MD  DATE OF DISCHARGE: 01/20/2017  7:09 PM  PRIMARY CARE PHYSICIAN: Tracie Harrier, MD    ADMISSION DIAGNOSIS:  Cough [R05] Hypoxia [R09.02] New onset of congestive heart failure ([place]) [I50.9] Acute on chronic diastolic CHF (congestive heart failure) ([place]) [I50.33]  DISCHARGE DIAGNOSIS:  Principal Problem:   Acute on chronic diastolic CHF (congestive heart failure) (HCC) Active Problems:   ESRD on hemodialysis ([place])   Essential hypertension   HLD (hyperlipidemia)   SECONDARY DIAGNOSIS:   Past Medical History:  Diagnosis Date  . 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 ESRD (end stage renal disease) ([place])    a. on HD x 11 years; b. felt to be 2/2 HTN; c. HD TTS  . History of nuclear stress test    a. 08/2015: probably normal myocardial perfusion study, no evidence for significant ischemia or scar was noted, during stress, global systolic function normal, EF 54%, RV borderline dilatation, coronary artery calcifications and marked mitral annular calcifications were noted, liver calcifications were noted on attenuation CT scan likely represent sequelae of previous granulomatous disease  . HLD (hyperlipidemia)   . Hypertension   . Ischemic stroke ([place])    a. 03/2014  . Metabolic syndrome   . Morbid obesity ([place])   . Multiple myeloma (HCC)    a. in remission  . Osteomyelitis ([place])    a. 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 Rheumatoid arthritis ([employer])   . TIA (transient ischemic attack)    a. 2014    HOSPITAL COURSE:   *Acute on chronic diastolic CHF (congestive heart failure) ([place]) - Patient was given IV Lasix in the ED, but states that she does not make any urine.  As per nephrology 2 days on HD back to [facility]. Pt fwlt better after that. *ESRD on  hemodialysis [facility]) - nephrology consult for dialysis support *Essential hypertension - continue home meds *HLD (hyperlipidemia) - continue home meds  DISCHARGE CONDITIONS:   Stable.  CONSULTS OBTAINED:  Treatment Team:  Lavonia Dana, MD  DRUG ALLERGIES:   Allergies  Allergen Reactions  . Shellfish Allergy     DISCHARGE MEDICATIONS:   Discharge Medication List as of 01/20/2017  4:53 PM    CONTINUE these medications which have CHANGED   Details  atorvastatin (LIPITOR) 40 MG tablet Take 1 tablet (40 mg total) by mouth daily., Starting Sat 01/21/2017, Print      CONTINUE these medications which have NOT CHANGED   Details  amLODipine (NORVASC) 10 MG tablet Take 10 mg by mouth daily., Starting Tue 09/27/2016, Historical Med    aspirin EC 81 MG tablet Take 81 mg by mouth daily., Historical Med    b complex-vitamin c-folic acid (NEPHRO-VITE) 0.8 MG TABS tablet Take 1 tablet by mouth daily., Starting Tue 09/27/2016, Historical Med    carvedilol (COREG) 25 MG tablet Take 25 mg by mouth 2 (two) times daily., Starting Wed 06/26/2013, Historical Med    loratadine (CLARITIN) 10 MG tablet Take 10 mg by mouth daily., Starting Tue 09/27/2016, Historical Med    meloxicam (MOBIC) 7.5 MG tablet Take 7.5 mg by mouth daily., Starting Tue 10/11/2016, Historical Med    cinacalcet (SENSIPAR) 60 MG tablet Take 60 mg by mouth 2 (two) times daily., Historical Med  irbesartan (AVAPRO) 300 MG tablet Take 300 mg by mouth daily., Starting Fri 09/16/2016, Until Sat 09/16/2017, Historical Med    mirtazapine (REMERON) 15 MG tablet Take 15 mg by mouth at bedtime., Starting Tue 10/11/2016, Historical Med    sevelamer carbonate (RENVELA) 800 MG tablet Take 800 mg by mouth 4 (four) times daily., Historical Med         DISCHARGE INSTRUCTIONS:    Follow with PMD in 1-2 weeks.  If you experience worsening of your admission symptoms, develop shortness of breath, life threatening emergency, suicidal or  homicidal thoughts you must seek medical attention immediately by calling 911 or calling your MD immediately  if symptoms less severe.  You Must read complete instructions/literature along with all the possible adverse reactions/side effects for all the Medicines you take and that have been prescribed to you. Take any new Medicines after you have completely understood and accept all the possible adverse reactions/side effects.   Please note  You were cared for by a hospitalist during your hospital stay. If you have any questions about your discharge medications or the care you received while you were in the hospital after you are discharged, you can call the unit and asked to speak with the hospitalist on call if the hospitalist that took care of you is not available. Once you are discharged, your primary care physician will handle any further medical issues. Please note that NO REFILLS for any discharge medications will be authorized once you are discharged, as it is imperative that you return to your primary care physician (or establish a relationship with a primary care physician if you do not have one) for your aftercare needs so that they can reassess your need for medications and monitor your lab values.    Today   CHIEF COMPLAINT:   Chief Complaint  Patient presents with  . Shortness of Breath    HISTORY OF PRESENT ILLNESS:  Cassandra Joseph  is a 71 y.o. female presents with Progressive shortness of breath over the last 3 days. Patient states she feels like they may not been pulling off enough fluid at dialysis. She states that for the past 2 nights she's had to sit upright, and last night she could not sleep at all due to her shortness of breath. She also states that at some point in the past she was on oxygen and thinks she may need that again, but she feels like her current symptoms are more acute. Initial workup in the ED shows likely heart failure exacerbation, hospitalists were called  for admission  VITAL SIGNS:  Blood pressure (!) 143/73, pulse 66, temperature 98.2 F (36.8 C), temperature source Oral, resp. rate (!) 21, height 5' 6"  (1.676 m), weight 121.7 kg (268 lb 4.8 oz), SpO2 100 %.  I/O:  No intake or output data in the 24 hours ending 01/28/17 0752  PHYSICAL EXAMINATION:  GENERAL:  71 y.o.-year-old patient lying in the bed with no acute distress.  EYES: Pupils equal, round, reactive to light and accommodation. No scleral icterus. Extraocular muscles intact.  HEENT: Head atraumatic, normocephalic. Oropharynx and nasopharynx clear.  NECK:  Supple, no jugular venous distention. No thyroid enlargement, no tenderness.  LUNGS: Normal breath sounds bilaterally, no wheezing, have some crepitation. No use of accessory muscles of respiration.  CARDIOVASCULAR: S1, S2 normal. No murmurs, rubs, or gallops.  ABDOMEN: Soft, nontender, nondistended. Bowel sounds present. No organomegaly or mass.  EXTREMITIES: No pedal edema, cyanosis, or clubbing. Right arm AV fistula present.  NEUROLOGIC: Cranial nerves II through XII are intact. Muscle strength 5/5 in all extremities. Sensation intact. Gait not checked.  PSYCHIATRIC: The patient is alert and oriented x 3.  SKIN: No obvious rash, lesion, or ulcer.   DATA REVIEW:   CBC No results for input(s): WBC, HGB, HCT, PLT in the last 168 hours.  Chemistries  No results for input(s): NA, K, CL, CO2, GLUCOSE, BUN, CREATININE, CALCIUM, MG, AST, ALT, ALKPHOS, BILITOT in the last 168 hours.  Invalid input(s): GFRCGP  Cardiac Enzymes No results for input(s): TROPONINI in the last 168 hours.  Microbiology Results  Results for orders placed or performed during the hospital encounter of 01/18/17  Blood Culture (routine x 2)     Status: None   Collection Time: 01/18/17  9:26 PM  Result Value Ref Range Status   Specimen Description BLOOD LFOA  Final   Special Requests   Final    BOTTLES DRAWN AEROBIC AND ANAEROBIC Blood Culture  results may not be optimal due to an excessive volume of blood received in culture bottles   Culture NO GROWTH 5 DAYS  Final   Report Status 01/23/2017 FINAL  Final  Blood Culture (routine x 2)     Status: None   Collection Time: 01/18/17  9:28 PM  Result Value Ref Range Status   Specimen Description BLOOD BLLA  Final   Special Requests   Final    BOTTLES DRAWN AEROBIC AND ANAEROBIC Blood Culture adequate volume   Culture NO GROWTH 5 DAYS  Final   Report Status 01/23/2017 FINAL  Final  MRSA PCR Screening     Status: Abnormal   Collection Time: 01/19/17  1:40 AM  Result Value Ref Range Status   MRSA by PCR POSITIVE (A) NEGATIVE Final    Comment:        The GeneXpert MRSA Assay (FDA approved for NASAL specimens only), is one component of a comprehensive MRSA colonization surveillance program. It is not intended to diagnose MRSA infection nor to guide or monitor treatment for MRSA infections. RESULT CALLED TO, READ BACK BY AND VERIFIED WITH: KAT MURRAY ON 01/19/17 AT 0334 QSD     RADIOLOGY:  No results found.  EKG:   Orders placed or performed during the hospital encounter of 01/18/17  . ED EKG  . ED EKG  . ED EKG 12-Lead  . ED EKG 12-Lead      Management plans discussed with the patient, family and they are in agreement.  CODE STATUS:  Code Status History    Date Active Date Inactive Code Status Order ID Comments User Context   01/19/2017  1:15 AM 01/20/2017 10:09 PM Full Code [id number]  Lance Coon, MD ED   11/09/2016  3:03 PM 11/10/2016  6:52 PM Full Code [id number]  Gladstone Lighter, MD ED      TOTAL TIME TAKING CARE OF THIS PATIENT: 35 minutes.    Vaughan Basta M.D on 01/28/2017 at 7:52 AM  Between 7am to 6pm - Pager - [phone]  After 6pm go to [email or web address] - password [email or web address] [facility]  Office  [phone]  CC: Primary care physician; Tracie Harrier, MD   Note: This dictation was prepared with Dragon dictation  along with smaller phrase technology. Any transcriptional errors that result from this process are unintentional.

## 2017-03-07 ENCOUNTER — Ambulatory Visit: Payer: Medicare Other | Attending: Internal Medicine

## 2017-03-07 DIAGNOSIS — G4733 Obstructive sleep apnea (adult) (pediatric): Secondary | ICD-10-CM | POA: Diagnosis not present

## 2017-04-07 ENCOUNTER — Ambulatory Visit: Payer: Medicare Other | Attending: Internal Medicine

## 2017-04-07 DIAGNOSIS — G4733 Obstructive sleep apnea (adult) (pediatric): Secondary | ICD-10-CM | POA: Diagnosis not present

## 2017-05-25 ENCOUNTER — Other Ambulatory Visit: Payer: Self-pay | Admitting: Internal Medicine

## 2017-05-25 DIAGNOSIS — Z1231 Encounter for screening mammogram for malignant neoplasm of breast: Secondary | ICD-10-CM

## 2017-06-12 ENCOUNTER — Ambulatory Visit
Admission: RE | Admit: 2017-06-12 | Discharge: 2017-06-12 | Disposition: A | Discharge status: Home / Self Care | Payer: Medicare Other | Source: Ambulatory Visit | Attending: Internal Medicine | Admitting: Internal Medicine

## 2017-06-12 DIAGNOSIS — Z1231 Encounter for screening mammogram for malignant neoplasm of breast: Secondary | ICD-10-CM | POA: Insufficient documentation

## 2017-11-08 ENCOUNTER — Ambulatory Visit: Payer: Medicare Other | Attending: Internal Medicine

## 2017-11-08 DIAGNOSIS — G4733 Obstructive sleep apnea (adult) (pediatric): Secondary | ICD-10-CM | POA: Diagnosis not present

## 2017-11-08 DIAGNOSIS — E669 Obesity, unspecified: Secondary | ICD-10-CM | POA: Insufficient documentation

## 2017-11-08 DIAGNOSIS — R0683 Snoring: Secondary | ICD-10-CM | POA: Diagnosis not present

## 2017-11-08 DIAGNOSIS — I10 Essential (primary) hypertension: Secondary | ICD-10-CM | POA: Insufficient documentation

## 2017-11-08 DIAGNOSIS — R0681 Apnea, not elsewhere classified: Secondary | ICD-10-CM | POA: Diagnosis present

## 2017-11-22 ENCOUNTER — Ambulatory Visit: Payer: Medicare Other | Attending: Internal Medicine

## 2017-11-22 DIAGNOSIS — G4733 Obstructive sleep apnea (adult) (pediatric): Secondary | ICD-10-CM | POA: Diagnosis not present

## 2018-06-01 ENCOUNTER — Other Ambulatory Visit
Admission: RE | Admit: 2018-06-01 | Discharge: 2018-06-01 | Disposition: A | Discharge status: Home / Self Care | Payer: Self-pay | Source: Ambulatory Visit | Attending: Nephrology | Admitting: Nephrology

## 2018-06-01 DIAGNOSIS — N186 End stage renal disease: Secondary | ICD-10-CM | POA: Insufficient documentation

## 2019-05-12 ENCOUNTER — Inpatient Hospital Stay
Admission: EM | Admit: 2019-05-12 | Discharge: 2019-05-14 | DRG: 291 | Disposition: A | Discharge status: Home Health | Payer: Medicare Other | Attending: Internal Medicine | Admitting: Internal Medicine

## 2019-05-12 ENCOUNTER — Other Ambulatory Visit: Payer: Self-pay

## 2019-05-12 ENCOUNTER — Emergency Department: Payer: Medicare Other

## 2019-05-12 DIAGNOSIS — I132 Hypertensive heart and chronic kidney disease with heart failure and with stage 5 chronic kidney disease, or end stage renal disease: Principal | ICD-10-CM | POA: Diagnosis present

## 2019-05-12 DIAGNOSIS — Z87891 Personal history of nicotine dependence: Secondary | ICD-10-CM

## 2019-05-12 DIAGNOSIS — Z8673 Personal history of transient ischemic attack (TIA), and cerebral infarction without residual deficits: Secondary | ICD-10-CM

## 2019-05-12 DIAGNOSIS — E213 Hyperparathyroidism, unspecified: Secondary | ICD-10-CM | POA: Diagnosis present

## 2019-05-12 DIAGNOSIS — Z7982 Long term (current) use of aspirin: Secondary | ICD-10-CM | POA: Diagnosis not present

## 2019-05-12 DIAGNOSIS — C9001 Multiple myeloma in remission: Secondary | ICD-10-CM | POA: Diagnosis present

## 2019-05-12 DIAGNOSIS — R0600 Dyspnea, unspecified: Secondary | ICD-10-CM

## 2019-05-12 DIAGNOSIS — G4733 Obstructive sleep apnea (adult) (pediatric): Secondary | ICD-10-CM | POA: Diagnosis present

## 2019-05-12 DIAGNOSIS — M069 Rheumatoid arthritis, unspecified: Secondary | ICD-10-CM | POA: Diagnosis present

## 2019-05-12 DIAGNOSIS — E785 Hyperlipidemia, unspecified: Secondary | ICD-10-CM | POA: Diagnosis present

## 2019-05-12 DIAGNOSIS — Z91013 Allergy to seafood: Secondary | ICD-10-CM

## 2019-05-12 DIAGNOSIS — Z20828 Contact with and (suspected) exposure to other viral communicable diseases: Secondary | ICD-10-CM | POA: Diagnosis present

## 2019-05-12 DIAGNOSIS — E8881 Metabolic syndrome: Secondary | ICD-10-CM | POA: Diagnosis present

## 2019-05-12 DIAGNOSIS — Z79891 Long term (current) use of opiate analgesic: Secondary | ICD-10-CM

## 2019-05-12 DIAGNOSIS — I2510 Atherosclerotic heart disease of native coronary artery without angina pectoris: Secondary | ICD-10-CM | POA: Diagnosis present

## 2019-05-12 DIAGNOSIS — Z6841 Body Mass Index (BMI) 40.0 and over, adult: Secondary | ICD-10-CM | POA: Diagnosis not present

## 2019-05-12 DIAGNOSIS — R0789 Other chest pain: Secondary | ICD-10-CM | POA: Diagnosis present

## 2019-05-12 DIAGNOSIS — I5033 Acute on chronic diastolic (congestive) heart failure: Secondary | ICD-10-CM | POA: Diagnosis present

## 2019-05-12 DIAGNOSIS — R7989 Other specified abnormal findings of blood chemistry: Secondary | ICD-10-CM | POA: Diagnosis present

## 2019-05-12 DIAGNOSIS — Z79899 Other long term (current) drug therapy: Secondary | ICD-10-CM

## 2019-05-12 DIAGNOSIS — E875 Hyperkalemia: Secondary | ICD-10-CM | POA: Diagnosis present

## 2019-05-12 DIAGNOSIS — Z992 Dependence on renal dialysis: Secondary | ICD-10-CM | POA: Diagnosis not present

## 2019-05-12 DIAGNOSIS — Z89422 Acquired absence of other left toe(s): Secondary | ICD-10-CM | POA: Diagnosis not present

## 2019-05-12 DIAGNOSIS — N186 End stage renal disease: Secondary | ICD-10-CM | POA: Diagnosis present

## 2019-05-12 DIAGNOSIS — Z90710 Acquired absence of both cervix and uterus: Secondary | ICD-10-CM

## 2019-05-12 DIAGNOSIS — Z808 Family history of malignant neoplasm of other organs or systems: Secondary | ICD-10-CM

## 2019-05-12 DIAGNOSIS — D631 Anemia in chronic kidney disease: Secondary | ICD-10-CM | POA: Diagnosis present

## 2019-05-12 DIAGNOSIS — Z8249 Family history of ischemic heart disease and other diseases of the circulatory system: Secondary | ICD-10-CM

## 2019-05-12 DIAGNOSIS — Z791 Long term (current) use of non-steroidal anti-inflammatories (NSAID): Secondary | ICD-10-CM

## 2019-05-12 DIAGNOSIS — Z90722 Acquired absence of ovaries, bilateral: Secondary | ICD-10-CM | POA: Diagnosis not present

## 2019-05-12 DIAGNOSIS — I050 Rheumatic mitral stenosis: Secondary | ICD-10-CM | POA: Diagnosis present

## 2019-05-12 DIAGNOSIS — I10 Essential (primary) hypertension: Secondary | ICD-10-CM | POA: Diagnosis present

## 2019-05-12 DIAGNOSIS — R0609 Other forms of dyspnea: Secondary | ICD-10-CM

## 2019-05-12 DIAGNOSIS — R0602 Shortness of breath: Secondary | ICD-10-CM

## 2019-05-12 LAB — BASIC METABOLIC PANEL
Anion gap: 18 — ABNORMAL HIGH (ref 5–15)
BUN: 84 mg/dL — ABNORMAL HIGH (ref 8–23)
CO2: 25 mmol/L (ref 22–32)
Calcium: 7.8 mg/dL — ABNORMAL LOW (ref 8.9–10.3)
Chloride: 96 mmol/L — ABNORMAL LOW (ref 98–111)
Creatinine, Ser: 12.51 mg/dL — ABNORMAL HIGH (ref 0.44–1.00)
GFR calc Af Amer: 3 mL/min — ABNORMAL LOW (ref >60)
GFR calc non Af Amer: 3 mL/min — ABNORMAL LOW (ref >60)
Glucose, Bld: 135 mg/dL — ABNORMAL HIGH (ref 70–99)
Potassium: 4.9 mmol/L (ref 3.5–5.1)
Sodium: 139 mmol/L (ref 135–145)

## 2019-05-12 LAB — BRAIN NATRIURETIC PEPTIDE: B Natriuretic Peptide: 629 pg/mL — ABNORMAL HIGH (ref 0.0–100.0)

## 2019-05-12 LAB — CBC
HCT: 32.4 % — ABNORMAL LOW (ref 36.0–46.0)
Hemoglobin: 9.9 g/dL — ABNORMAL LOW (ref 12.0–15.0)
MCH: 29.6 pg (ref 26.0–34.0)
MCHC: 30.6 g/dL (ref 30.0–36.0)
MCV: 97 fL (ref 80.0–100.0)
Platelets: 249 10*3/uL (ref 150–400)
RBC: 3.34 MIL/uL — ABNORMAL LOW (ref 3.87–5.11)
RDW: 15.9 % — ABNORMAL HIGH (ref 11.5–15.5)
WBC: 8.2 10*3/uL (ref 4.0–10.5)
nRBC: 0 % (ref 0.0–0.2)

## 2019-05-12 LAB — TROPONIN I (HIGH SENSITIVITY)
Troponin I (High Sensitivity): 45 ng/L — ABNORMAL HIGH (ref <18)
Troponin I (High Sensitivity): 47 ng/L — ABNORMAL HIGH (ref <18)

## 2019-05-12 MED ORDER — SODIUM CHLORIDE 0.9% FLUSH
3.0000 mL | Freq: Once | INTRAVENOUS | Status: AC
  Administered 2019-05-12: 3 mL via INTRAVENOUS

## 2019-05-12 NOTE — ED Provider Notes (Addendum)
[facility] Emergency Department Provider Note  ____________________________________________   First MD Initiated Contact with Patient 05/12/19 2226     (approximate)  I have reviewed the triage vital signs and the nursing notes.  History  Chief Complaint Chest Pain    HPI Cassandra Joseph is a 73 y.o. female history of ESRD (MWF, complaint), CVA, MV stenosis who presents to the emergency department for progressively worsening shortness of breath as well as chest pain.  She states over the last 2 weeks she has become extremely short of breath with exertion. Even simple adjustments in bed make her SOB. She also has associated orthopnea, which is new over the same time period. She also reports chest discomfort, mostly on exertion, but she has had a few episodes while at rest. She reports compliance with her dialysis, MWF. No fevers, cough. She does think she has seen some dark or potentially bloody stools over the same time period. She is not on any anticoagulation.    Past Medical Hx Past Medical History:  Diagnosis Date  . 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 ESRD (end stage renal disease) ([place])    a. on HD x 11 years; b. felt to be 2/2 HTN; c. HD TTS  . History of nuclear stress test    a. 08/2015: probably normal myocardial perfusion study, no evidence for significant ischemia or scar was noted, during stress, global systolic function normal, EF 54%, RV borderline dilatation, coronary artery calcifications and marked mitral annular calcifications were noted, liver calcifications were noted on attenuation CT scan likely represent sequelae of previous granulomatous disease  . HLD (hyperlipidemia)   . Hypertension   . Ischemic stroke ([place])    a. 03/2014  . Metabolic syndrome   . Morbid obesity ([place])   . Multiple myeloma (HCC)    a. in remission  . Osteomyelitis ([place])    a. 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 Rheumatoid arthritis ([place])   . TIA  (transient ischemic attack)    a. 2014    Problem List Patient Active Problem List   Diagnosis Date Noted  . Acute on chronic diastolic CHF (congestive heart failure) ([place]) 01/18/2017  . HLD (hyperlipidemia) 01/18/2017  . Atypical chest pain 11/09/2016  . ESRD on hemodialysis ([place]) 11/09/2016  . Morbid obesity ([place]) 11/09/2016  . Essential hypertension 11/09/2016  . Multiple myeloma ([place]) 11/09/2016  . Elevated troponin 11/09/2016  . Metabolic syndrome 99/35/7017    Past Surgical Hx Past Surgical History:  Procedure Laterality Date  . ABDOMINAL HYSTERECTOMY    . APPENDECTOMY    . eyelid surgery    . HERNIA REPAIR    . right knee surgery      Medications Prior to Admission medications   Medication Sig Start Date End Date Taking? Authorizing Provider  amLODipine (NORVASC) 10 MG tablet Take 10 mg by mouth daily. 09/27/16   [provider]  aspirin EC 81 MG tablet Take 81 mg by mouth daily.    [provider]  atorvastatin (LIPITOR) 40 MG tablet Take 1 tablet (40 mg total) by mouth daily. 01/21/17   Vaughan Basta, MD  b complex-vitamin c-folic acid (NEPHRO-VITE) 0.8 MG TABS tablet Take 1 tablet by mouth daily. 09/27/16   [provider]  carvedilol (COREG) 25 MG tablet Take 25 mg by mouth 2 (two) times daily. 06/26/13   [provider]  cinacalcet (SENSIPAR) 60 MG tablet Take 60 mg by mouth 2 (  two) times daily.    [provider]  irbesartan (AVAPRO) 300 MG tablet Take 300 mg by mouth daily. 09/16/16 09/16/17  [provider]  loratadine (CLARITIN) 10 MG tablet Take 10 mg by mouth daily. 09/27/16   [provider]  meloxicam (MOBIC) 7.5 MG tablet Take 7.5 mg by mouth daily. 10/11/16   [provider]  mirtazapine (REMERON) 15 MG tablet Take 15 mg by mouth at bedtime. 10/11/16   [provider]  sevelamer carbonate (RENVELA) 800 MG tablet Take 800 mg by mouth 4 (four) times daily.    [provider]    Allergies Shellfish allergy  Family Hx Family History  Problem Relation Age of Onset  . Seizures Mother   . Hypertension Mother   . Throat cancer Father   . Breast cancer Neg Hx     Social Hx Social History   Tobacco Use  . Smoking status: Former Research scientist (life sciences)  . Smokeless tobacco: Never Used  . Tobacco comment: quit in 2007  Substance Use Topics  . Alcohol use: No  . Drug use: No     Review of Systems  Constitutional: Negative for fever, chills. Eyes: Negative for visual changes. ENT: Negative for sore throat. Cardiovascular: + for chest pain. Respiratory: + for shortness of breath. Gastrointestinal: Negative for nausea, vomiting.  Genitourinary: Negative for dysuria. Musculoskeletal: Negative for leg swelling. Skin: Negative for rash. Neurological: Negative for for headaches.   Physical Exam  Vital Signs: ED Triage Vitals  Enc Vitals Group     BP 05/12/19 1931 (!) 188/65     Pulse Rate 05/12/19 1931 84     Resp 05/12/19 1931 20     Temp 05/12/19 1931 98.5 F (36.9 C)     Temp Source 05/12/19 1931 Oral     SpO2 05/12/19 1931 99 %     Weight 05/12/19 1923 272 lb (123.4 kg)     Height 05/12/19 1923 5' 6"  (1.676 m)     Head Circumference --      Peak Flow --      Pain Score 05/12/19 1923 8     Pain Loc --      Pain Edu? --      Excl. in [place]? --     Constitutional: Alert and oriented. Obese.  Head: Normocephalic. Atraumatic. Eyes: Conjunctivae clear. Sclera anicteric. Nose: No congestion. No rhinorrhea. Mouth/Throat: Mucous membranes are moist.  Neck: No stridor.   Cardiovascular: Normal rate, regular rhythm. Fistula in RUE with palpable thrill.  Respiratory: Gets extremely short of breath even with adjustments in bed. With minor adjustments in bed she desaturates to 88% on RA.  Gastrointestinal:  Non-distended. Rectal exam w/ RN chaperone, brown stool, guaiac negative. Musculoskeletal:  No deformities. Neurologic:  Normal speech and  language. No gross focal neurologic deficits are appreciated.  Skin: Skin is warm, dry and intact. No rash noted. Psychiatric: Mood and affect are appropriate for situation.  EKG  Personally reviewed.   Rate: within normal limits Rhythm: sinus Axis: normal Intervals: within normal limits No acute ischemic changes No STEMI     Radiology  XR: IMPRESSION:  Very low volume examination with significant elevation of the right  hemidiaphragm similar to prior examinations and mild, diffuse  interstitial pulmonary opacity, this appearance possibly reflecting  mild edema although likely exaggerated by low volume technique. No  focal airspace opacity appreciated.    Procedures  Procedure(s) performed (including critical care):  Procedures   Initial Impression / Assessment  and Plan / ED Course  73 y.o. female who presents to the ED for SOB, chest pain as above.  Ddx: volume overload, HF, pulmonary infection, unstable angina. She is guaiac negative on exam, less likely related to blood loss/anemia.  Plan: labs, EKG, XR  Anemia is actually slightly improved from baseline. XR with edema, slightly elevated BNP. HS trop moderately elevated, though in the setting of ESRD. EKG w/o acute ischemia. Given this, along with her desaturation with minimal exertion, will plan for admission for further work up/management. Patient/family agreeable. D/w hospitalist for admission.   Final Clinical Impression(s) / ED Diagnosis  Final diagnoses:  SOB (shortness of breath)  DOE (dyspnea on exertion)       Note:  This document was prepared using Dragon voice recognition software and may include unintentional dictation errors.    Lilia Pro., MD 05/13/19 0201    Lilia Pro., MD 05/13/19 [phone]

## 2019-05-12 NOTE — ED Triage Notes (Signed)
Pt to the er for chest pain, n/v earlier today. Pain is midsternal. Pt states ems came out last night but she refused transport. Pt is scheduled for a stress test due to blockages. Pt is also a dialysis pt. Pt reports one fall today.

## 2019-05-12 NOTE — H&P (Signed)
[facility] [place] NAME: Cassandra Joseph    MR#:  [id number]  DATE OF BIRTH:  August 28, 1945  DATE OF ADMISSION:  05/12/2019  PRIMARY CARE PHYSICIAN: Tracie Harrier, MD   REQUESTING/REFERRING PHYSICIAN: Joan Mayans, MD  CHIEF COMPLAINT:   Chief Complaint  Patient presents with  . Chest Pain    HISTORY OF PRESENT ILLNESS:  Cassandra Joseph  is a 73 y.o. female who presents with chief complaint as above.  Patient presents the ED with a complaint of chest discomfort and shortness of breath.  She is a dialysis patient and states she had her last dialysis as scheduled on Friday.  She does state that she feels like they may not be taking off enough fluid during dialysis.  On evaluation here in the ED tonight she is noted to have significant vascular congestion with some mild pulmonary edema.  Hospitalist were called for admission  PAST MEDICAL HISTORY:   Past Medical History:  Diagnosis Date  . 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 ESRD (end stage renal disease) ([place])    a. on HD x 11 years; b. felt to be 2/2 HTN; c. HD TTS  . History of nuclear stress test    a. 08/2015: probably normal myocardial perfusion study, no evidence for significant ischemia or scar was noted, during stress, global systolic function normal, EF 54%, RV borderline dilatation, coronary artery calcifications and marked mitral annular calcifications were noted, liver calcifications were noted on attenuation CT scan likely represent sequelae of previous granulomatous disease  . HLD (hyperlipidemia)   . Hypertension   . Ischemic stroke ([place])    a. 03/2014  . Metabolic syndrome   . Morbid obesity ([place])   . Multiple myeloma (HCC)    a. in remission  . Osteomyelitis ([place])    a. 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 Rheumatoid arthritis ([place])   . TIA (transient ischemic attack)    a. 2014     PAST SURGICAL HISTORY:   Past Surgical History:  Procedure Laterality  Date  . ABDOMINAL HYSTERECTOMY    . APPENDECTOMY    . eyelid surgery    . HERNIA REPAIR    . right knee surgery       SOCIAL HISTORY:   Social History   Tobacco Use  . Smoking status: Former Research scientist (life sciences)  . Smokeless tobacco: Never Used  . Tobacco comment: quit in 2007  Substance Use Topics  . Alcohol use: No     FAMILY HISTORY:   Family History  Problem Relation Age of Onset  . Seizures Mother   . Hypertension Mother   . Throat cancer Father   . Breast cancer Neg Hx      DRUG ALLERGIES:   Allergies  Allergen Reactions  . Shellfish Allergy     MEDICATIONS AT HOME:   Prior to Admission medications   Medication Sig Start Date End Date Taking? Authorizing Provider  amLODipine (NORVASC) 10 MG tablet Take 10 mg by mouth daily. 09/27/16   [provider]  aspirin EC 81 MG tablet Take 81 mg by mouth daily.    [provider]  atorvastatin (LIPITOR) 40 MG tablet Take 1 tablet (40 mg total) by mouth daily. 01/21/17   Vaughan Basta, MD  b complex-vitamin c-folic acid (NEPHRO-VITE) 0.8 MG TABS tablet Take 1 tablet by mouth daily. 09/27/16   [provider]  carvedilol (COREG) 25 MG tablet Take 25 mg  by mouth 2 (two) times daily. 06/26/13   [provider]  cinacalcet (SENSIPAR) 60 MG tablet Take 60 mg by mouth 2 (two) times daily.    [provider]  irbesartan (AVAPRO) 300 MG tablet Take 300 mg by mouth daily. 09/16/16 09/16/17  [provider]  loratadine (CLARITIN) 10 MG tablet Take 10 mg by mouth daily. 09/27/16   [provider]  meloxicam (MOBIC) 7.5 MG tablet Take 7.5 mg by mouth daily. 10/11/16   [provider]  mirtazapine (REMERON) 15 MG tablet Take 15 mg by mouth at bedtime. 10/11/16   [provider]  sevelamer carbonate (RENVELA) 800 MG tablet Take 800 mg by mouth 4 (four) times daily.    [provider]    REVIEW OF SYSTEMS:  Review of Systems  Constitutional: Negative for  chills, fever, malaise/fatigue and weight loss.  HENT: Negative for ear pain, hearing loss and tinnitus.   Eyes: Negative for blurred vision, double vision, pain and redness.  Respiratory: Positive for shortness of breath. Negative for cough and hemoptysis.   Cardiovascular: Positive for chest pain. Negative for palpitations, orthopnea and leg swelling.  Gastrointestinal: Negative for abdominal pain, constipation, diarrhea, nausea and vomiting.  Genitourinary: Negative for dysuria, frequency and hematuria.  Musculoskeletal: Negative for back pain, joint pain and neck pain.  Skin:       No acne, rash, or lesions  Neurological: Negative for dizziness, tremors, focal weakness and weakness.  Endo/Heme/Allergies: Negative for polydipsia. Does not bruise/bleed easily.  Psychiatric/Behavioral: Negative for depression. The patient is not nervous/anxious and does not have insomnia.      VITAL SIGNS:   Vitals:   05/12/19 1923 05/12/19 1931 05/12/19 2100 05/12/19 2300  BP:  (!) 188/65 (!) 171/72 (!) 151/73  Pulse:  84 80 78  Resp:  20 20 (!) 21  Temp:  98.5 F (36.9 C) 98.1 F (36.7 C)   TempSrc:  Oral Oral   SpO2:  99% 96% 97%  Weight: 123.4 kg     Height: 5' 6" (1.676 m)      Wt Readings from Last 3 Encounters:  05/12/19 123.4 kg  01/20/17 121.7 kg  11/09/16 123 kg    PHYSICAL EXAMINATION:  Physical Exam  Vitals reviewed. Constitutional: She is oriented to person, place, and time. She appears well-developed and well-nourished. No distress.  HENT:  Head: Normocephalic and atraumatic.  Mouth/Throat: Oropharynx is clear and moist.  Eyes: Pupils are equal, round, and reactive to light. Conjunctivae and EOM are normal. No scleral icterus.  Neck: Normal range of motion. Neck supple. No JVD present. No thyromegaly present.  Cardiovascular: Normal rate, regular rhythm and intact distal pulses. Exam reveals no gallop and no friction rub.  No murmur heard. Respiratory: Effort normal. No  respiratory distress. She has no wheezes. She has rales.  GI: Soft. Bowel sounds are normal. She exhibits no distension. There is no abdominal tenderness.  Musculoskeletal: Normal range of motion.        General: No edema.     Comments: No arthritis, no gout  Lymphadenopathy:    She has no cervical adenopathy.  Neurological: She is alert and oriented to person, place, and time. No cranial nerve deficit.  No dysarthria, no aphasia  Skin: Skin is warm and dry. No rash noted. No erythema.  Psychiatric: She has a normal mood and affect. Her behavior is normal. Judgment and thought content normal.    LABORATORY PANEL:   CBC Recent Labs  Lab 05/12/19  1939  WBC 8.2  HGB 9.9*  HCT 32.4*  PLT 249   ------------------------------------------------------------------------------------------------------------------  Chemistries  Recent Labs  Lab 05/12/19 1939  NA 139  K 4.9  CL 96*  CO2 25  GLUCOSE 135*  BUN 84*  CREATININE 12.51*  CALCIUM 7.8*   ------------------------------------------------------------------------------------------------------------------  Cardiac Enzymes No results for input(s): TROPONINI in the last 168 hours. ------------------------------------------------------------------------------------------------------------------  RADIOLOGY:  Dg Chest 2 View  Result Date: 05/12/2019 CLINICAL DATA:  Chest pain, dialysis EXAM: CHEST - 2 VIEW COMPARISON:  01/18/2017 FINDINGS: Very low volume examination with significant elevation of right hemidiaphragm similar to prior examinations and mild, diffuse interstitial pulmonary opacity, this appearance likely exaggerated by low volume technique. The heart and mediastinal contours are not well assessed. Aortic atherosclerosis. Disc degenerative disease of the thoracic spine. IMPRESSION: Very low volume examination with significant elevation of the right hemidiaphragm similar to prior examinations and mild, diffuse  interstitial pulmonary opacity, this appearance possibly reflecting mild edema although likely exaggerated by low volume technique. No focal airspace opacity appreciated. Electronically Signed   By: Eddie Candle M.D.   On: 05/12/2019 20:17    EKG:   Orders placed or performed during the hospital encounter of 05/12/19  . ED EKG  . ED EKG    IMPRESSION AND PLAN:  Principal Problem:   Acute on chronic diastolic CHF (congestive heart failure) ([place]) -patient has a history of diastolic heart failure.  Suspect that her current situation is related to mild fluid overload in conjunction with her diastolic failure.  She does not make any urine and so we cannot diurese her.  However, she does have sleep apnea and so we will place her on BiPAP tonight and she can receive dialysis tomorrow.  Might consider cardiology consult Active Problems:   ESRD on hemodialysis [facility]) -nephrology consult for dialysis support   Essential hypertension -home dose antihypertensives   OSA -BiPAP nightly   HLD (hyperlipidemia) -home dose antilipid  Chart review performed and case discussed with ED provider. Labs, imaging and/or ECG reviewed by provider and discussed with patient/family. Management plans discussed with the patient and/or family.  COVID-19 status: Tested negative     DVT PROPHYLAXIS: SubQ heparin  GI PROPHYLAXIS:  None  ADMISSION STATUS: Inpatient     CODE STATUS: Full Code Status History    Date Active Date Inactive Code Status Order ID Comments User Context   01/19/2017 0115 01/20/2017 2209 Full Code [id number]  Lance Coon, MD ED   11/09/2016 1503 11/10/2016 1852 Full Code [id number]  Gladstone Lighter, MD ED   Advance Care Planning Activity      TOTAL TIME TAKING CARE OF THIS PATIENT: 45 minutes.   This patient was evaluated in the context of the global COVID-19 pandemic, which necessitated consideration that the patient might be at risk for infection with the SARS-CoV-2 virus that causes  COVID-19. Institutional protocols and algorithms that pertain to the evaluation of patients at risk for COVID-19 are in a state of rapid change based on information released by regulatory bodies including the CDC and federal and state organizations. These policies and algorithms were followed to the best of this provider's knowledge to date during the patient's care at this facility.  Ethlyn Daniels 05/12/2019, 11:55 PM  [facility]  Office  [phone]  CC: Primary care physician; Tracie Harrier, MD  Note:  This document was prepared using Dragon voice recognition software and may include unintentional dictation errors.

## 2019-05-13 LAB — CBC
HCT: 28 % — ABNORMAL LOW (ref 36.0–46.0)
Hemoglobin: 8.7 g/dL — ABNORMAL LOW (ref 12.0–15.0)
MCH: 29.6 pg (ref 26.0–34.0)
MCHC: 31.1 g/dL (ref 30.0–36.0)
MCV: 95.2 fL (ref 80.0–100.0)
Platelets: 233 10*3/uL (ref 150–400)
RBC: 2.94 MIL/uL — ABNORMAL LOW (ref 3.87–5.11)
RDW: 15.9 % — ABNORMAL HIGH (ref 11.5–15.5)
WBC: 8 10*3/uL (ref 4.0–10.5)
nRBC: 0 % (ref 0.0–0.2)

## 2019-05-13 LAB — BASIC METABOLIC PANEL
Anion gap: 18 — ABNORMAL HIGH (ref 5–15)
BUN: 95 mg/dL — ABNORMAL HIGH (ref 8–23)
CO2: 23 mmol/L (ref 22–32)
Calcium: 7.6 mg/dL — ABNORMAL LOW (ref 8.9–10.3)
Chloride: 100 mmol/L (ref 98–111)
Creatinine, Ser: 13.11 mg/dL — ABNORMAL HIGH (ref 0.44–1.00)
GFR calc Af Amer: 3 mL/min — ABNORMAL LOW (ref >60)
GFR calc non Af Amer: 3 mL/min — ABNORMAL LOW (ref >60)
Glucose, Bld: 76 mg/dL (ref 70–99)
Potassium: 5.3 mmol/L — ABNORMAL HIGH (ref 3.5–5.1)
Sodium: 141 mmol/L (ref 135–145)

## 2019-05-13 LAB — MRSA PCR SCREENING: MRSA by PCR: NEGATIVE

## 2019-05-13 LAB — SARS CORONAVIRUS 2 (TAT 6-24 HRS): SARS Coronavirus 2: NEGATIVE

## 2019-05-13 LAB — PHOSPHORUS: Phosphorus: 8.8 mg/dL — ABNORMAL HIGH (ref 2.5–4.6)

## 2019-05-13 MED ORDER — ONDANSETRON HCL 4 MG PO TABS: 4.0000 mg | ORAL_TABLET | Freq: Four times a day (QID) | ORAL | Status: DC | PRN

## 2019-05-13 MED ORDER — HYDROCODONE-ACETAMINOPHEN 5-325 MG PO TABS: 1.0000 | ORAL_TABLET | Freq: Every day | ORAL | Status: DC | PRN

## 2019-05-13 MED ORDER — CHLORHEXIDINE GLUCONATE CLOTH 2 % EX PADS
6.0000 | MEDICATED_PAD | Freq: Every day | CUTANEOUS | Status: DC
  Administered 2019-05-13 – 2019-05-14 (×2): 6 via TOPICAL

## 2019-05-13 MED ORDER — IRBESARTAN 150 MG PO TABS: 300.0000 mg | ORAL_TABLET | Freq: Every day | ORAL | Status: DC

## 2019-05-13 MED ORDER — SEVELAMER CARBONATE 800 MG PO TABS
800.0000 mg | ORAL_TABLET | Freq: Three times a day (TID) | ORAL | Status: DC
  Administered 2019-05-13 – 2019-05-14 (×3): 800 mg via ORAL
  Filled 2019-05-13 (×3): qty 1

## 2019-05-13 MED ORDER — SEVELAMER CARBONATE 800 MG PO TABS
800.0000 mg | ORAL_TABLET | Freq: Four times a day (QID) | ORAL | Status: DC
  Administered 2019-05-13: 800 mg via ORAL
  Filled 2019-05-13: qty 1

## 2019-05-13 MED ORDER — DOXYCYCLINE HYCLATE 100 MG PO TABS
100.0000 mg | ORAL_TABLET | Freq: Two times a day (BID) | ORAL | Status: DC
  Administered 2019-05-13 – 2019-05-14 (×2): 100 mg via ORAL
  Filled 2019-05-13 (×2): qty 1

## 2019-05-13 MED ORDER — ZOLPIDEM TARTRATE 5 MG PO TABS
5.0000 mg | ORAL_TABLET | Freq: Every evening | ORAL | Status: DC | PRN
  Administered 2019-05-13: 5 mg via ORAL
  Filled 2019-05-13: qty 1

## 2019-05-13 MED ORDER — ONDANSETRON HCL 4 MG/2ML IJ SOLN: 4.0000 mg | Freq: Four times a day (QID) | INTRAMUSCULAR | Status: DC | PRN

## 2019-05-13 MED ORDER — IRBESARTAN 150 MG PO TABS
300.0000 mg | ORAL_TABLET | Freq: Every day | ORAL | Status: DC
  Administered 2019-05-13: 300 mg via ORAL
  Filled 2019-05-13: qty 2

## 2019-05-13 MED ORDER — RENA-VITE PO TABS
1.0000 | ORAL_TABLET | Freq: Every day | ORAL | Status: DC
  Administered 2019-05-13: 1 via ORAL
  Filled 2019-05-13 (×2): qty 1

## 2019-05-13 MED ORDER — HEPARIN SODIUM (PORCINE) 5000 UNIT/ML IJ SOLN
5000.0000 [IU] | Freq: Three times a day (TID) | INTRAMUSCULAR | Status: DC
  Administered 2019-05-13 – 2019-05-14 (×4): 5000 [IU] via SUBCUTANEOUS
  Filled 2019-05-13 (×4): qty 1

## 2019-05-13 MED ORDER — ASPIRIN EC 81 MG PO TBEC
81.0000 mg | DELAYED_RELEASE_TABLET | Freq: Every day | ORAL | Status: DC
  Administered 2019-05-13 – 2019-05-14 (×2): 81 mg via ORAL
  Filled 2019-05-13 (×2): qty 1

## 2019-05-13 MED ORDER — AMLODIPINE BESYLATE 10 MG PO TABS
10.0000 mg | ORAL_TABLET | Freq: Every day | ORAL | Status: DC
  Filled 2019-05-13: qty 1

## 2019-05-13 MED ORDER — ACETAMINOPHEN 650 MG RE SUPP: 650.0000 mg | Freq: Four times a day (QID) | RECTAL | Status: DC | PRN

## 2019-05-13 MED ORDER — AMLODIPINE BESYLATE 10 MG PO TABS
10.0000 mg | ORAL_TABLET | Freq: Every day | ORAL | Status: DC
  Administered 2019-05-13: 10 mg via ORAL
  Filled 2019-05-13: qty 1

## 2019-05-13 MED ORDER — ATORVASTATIN CALCIUM 20 MG PO TABS
40.0000 mg | ORAL_TABLET | Freq: Every day | ORAL | Status: DC
  Administered 2019-05-13: 40 mg via ORAL
  Filled 2019-05-13: qty 2

## 2019-05-13 MED ORDER — GABAPENTIN 100 MG PO CAPS
100.0000 mg | ORAL_CAPSULE | Freq: Two times a day (BID) | ORAL | Status: DC
  Administered 2019-05-13 – 2019-05-14 (×3): 100 mg via ORAL
  Filled 2019-05-13 (×3): qty 1

## 2019-05-13 MED ORDER — CARVEDILOL 25 MG PO TABS
25.0000 mg | ORAL_TABLET | Freq: Two times a day (BID) | ORAL | Status: DC
  Administered 2019-05-13 – 2019-05-14 (×2): 25 mg via ORAL
  Filled 2019-05-13 (×3): qty 1

## 2019-05-13 MED ORDER — ACETAMINOPHEN 325 MG PO TABS
650.0000 mg | ORAL_TABLET | Freq: Four times a day (QID) | ORAL | Status: DC | PRN
  Administered 2019-05-13: 650 mg via ORAL
  Filled 2019-05-13: qty 2

## 2019-05-13 MED ORDER — HEPARIN SODIUM (PORCINE) 1000 UNIT/ML DIALYSIS
20.0000 [IU]/kg | INTRAMUSCULAR | Status: DC | PRN
  Filled 2019-05-13: qty 3

## 2019-05-13 MED ORDER — CINACALCET HCL 30 MG PO TABS
60.0000 mg | ORAL_TABLET | Freq: Two times a day (BID) | ORAL | Status: DC
  Administered 2019-05-13 – 2019-05-14 (×3): 60 mg via ORAL
  Filled 2019-05-13 (×4): qty 2

## 2019-05-13 MED ORDER — EPOETIN ALFA 10000 UNIT/ML IJ SOLN
4000.0000 [IU] | INTRAMUSCULAR | Status: DC
  Administered 2019-05-13: 4000 [IU] via INTRAVENOUS

## 2019-05-13 NOTE — Progress Notes (Signed)
Established hemodialysis known [facility] MWF 5:30, patient transports with [facility]. Please contact me directly with any dialysis placement concerns.  [name] Dialysis Coordinator [phone]

## 2019-05-13 NOTE — Progress Notes (Signed)
PT Cancellation Note  Patient Details Name: JINI HORIUCHI MRN: [id number] DOB: [birth year]   Cancelled Treatment:    Reason Eval/Treat Not Completed: Patient at procedure or test/unavailable(Consult received and chart reviewed. Patient currently off unit for dialysis.  Will re-attempt at later time/date as medically appropriate and available.)   Lalonnie Shaffer H. Owens Shark, PT, DPT, NCS 05/13/19, 2:56 PM [phone]

## 2019-05-13 NOTE — ED Notes (Signed)
ED TO INPATIENT HANDOFF REPORT  ED Nurse Name and Phone #: Nira Conn [phone]  S Name/Age/Gender Cassandra Joseph 73 y.o. female Room/Bed: ED02A/ED02A  Code Status   Code Status: Prior  Home/SNF/Other Home Patient oriented to: A/O x 4 Is this baseline? Yes   Triage Complete: Triage complete  Chief Complaint Chest Pain/Shob  Triage Note Pt to the er for chest pain, n/v earlier today. Pain is midsternal. Pt states ems came out last night but she refused transport. Pt is scheduled for a stress test due to blockages. Pt is also a dialysis pt. Pt reports one fall today.   Allergies Allergies  Allergen Reactions  . Shellfish Allergy     Level of Care/Admitting Diagnosis ED Disposition    ED Disposition Condition [facility] Area: [place] [[id number]]  Level of Care: Telemetry [5]  Covid Evaluation: Asymptomatic Screening Protocol (No Symptoms)  Diagnosis: Acute on chronic diastolic CHF (congestive heart failure) [facility]) [709628]  Admitting Physician: Lance Coon [[id number]]  Attending Physician: Jannifer Franklin, DAVID [phone]  Estimated length of stay: past midnight tomorrow  Certification:: I certify this patient will need inpatient services for at least 2 midnights  Bed request comments: 2a  PT Class (Do Not Modify): Inpatient [101]  PT Acc Code (Do Not Modify): Private [1]       B Medical/Surgery History Past Medical History:  Diagnosis Date  . 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 ESRD (end stage renal disease) ([place])    a. on HD x 11 years; b. felt to be 2/2 HTN; c. HD TTS  . History of nuclear stress test    a. 08/2015: probably normal myocardial perfusion study, no evidence for significant ischemia or scar was noted, during stress, global systolic function normal, EF 54%, RV borderline dilatation, coronary artery calcifications and marked mitral annular calcifications were noted, liver calcifications were noted on attenuation CT scan  likely represent sequelae of previous granulomatous disease  . HLD (hyperlipidemia)   . Hypertension   . Ischemic stroke ([place])    a. 03/2014  . Metabolic syndrome   . Morbid obesity ([place])   . Multiple myeloma (HCC)    a. in remission  . Osteomyelitis ([place])    a. 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 Rheumatoid arthritis ([place])   . TIA (transient ischemic attack)    a. 2014   Past Surgical History:  Procedure Laterality Date  . ABDOMINAL HYSTERECTOMY    . APPENDECTOMY    . eyelid surgery    . HERNIA REPAIR    . right knee surgery       A IV Location/Drains/Wounds Patient Lines/Drains/Airways Status   Active Line/Drains/Airways    Name:   Placement date:   Placement time:   Site:   Days:   Peripheral IV 05/12/19 Left Forearm   05/12/19    2236    Forearm   1   Fistula / Graft Right Upper arm   -    -    Upper arm             Intake/Output Last 24 hours No intake or output data in the 24 hours ending 05/13/19 0000  Labs/Imaging Results for orders placed or performed during the hospital encounter of 05/12/19 (from the past 48 hour(s))  Basic metabolic panel     Status: Abnormal   Collection Time: 05/12/19  7:39 PM  Result Value Ref Range   Sodium 139  135 - 145 mmol/L   Potassium 4.9 3.5 - 5.1 mmol/L   Chloride 96 (L) 98 - 111 mmol/L   CO2 25 22 - 32 mmol/L   Glucose, Bld 135 (H) 70 - 99 mg/dL   BUN 84 (H) 8 - 23 mg/dL   Creatinine, Ser 12.51 (H) 0.44 - 1.00 mg/dL   Calcium 7.8 (L) 8.9 - 10.3 mg/dL   GFR calc non Af Amer 3 (L) >60 mL/min   GFR calc Af Amer 3 (L) >60 mL/min   Anion gap 18 (H) 5 - 15    Comment: Performed at [facility], [address] [place], [place] [address]  CBC     Status: Abnormal   Collection Time: 05/12/19  7:39 PM  Result Value Ref Range   WBC 8.2 4.0 - 10.5 K/uL   RBC 3.34 (L) 3.87 - 5.11 MIL/uL   Hemoglobin 9.9 (L) 12.0 - 15.0 g/dL   HCT 32.4 (L) 36.0 - 46.0 %   MCV 97.0 80.0 - 100.0 fL   MCH 29.6 26.0 - 34.0 pg   MCHC  30.6 30.0 - 36.0 g/dL   RDW 15.9 (H) 11.5 - 15.5 %   Platelets 249 150 - 400 K/uL   nRBC 0.0 0.0 - 0.2 %    Comment: Performed at [facility], [place], [place] [address]  Troponin I (High Sensitivity)     Status: Abnormal   Collection Time: 05/12/19  7:39 PM  Result Value Ref Range   Troponin I (High Sensitivity) 45 (H) <18 ng/L    Comment: (NOTE) Elevated high sensitivity troponin I (hsTnI) values and significant  changes across serial measurements may suggest ACS but many other  chronic and acute conditions are known to elevate hsTnI results.  Refer to the "Links" section for chest pain algorithms and additional  guidance. Performed at [facility], [address] [place], [place] [address]   Brain natriuretic peptide     Status: Abnormal   Collection Time: 05/12/19  7:39 PM  Result Value Ref Range   B Natriuretic Peptide 629.0 (H) 0.0 - 100.0 pg/mL    Comment: Performed at [facility], [place], [place] [address]  Troponin I (High Sensitivity)     Status: Abnormal   Collection Time: 05/12/19 10:35 PM  Result Value Ref Range   Troponin I (High Sensitivity) 47 (H) <18 ng/L    Comment: (NOTE) Elevated high sensitivity troponin I (hsTnI) values and significant  changes across serial measurements may suggest ACS but many other  chronic and acute conditions are known to elevate hsTnI results.  Refer to the "Links" section for chest pain algorithms and additional  guidance. Performed at [facility], [address] [place], [place] [address]    Dg Chest 2 View  Result Date: 05/12/2019 CLINICAL DATA:  Chest pain, dialysis EXAM: CHEST - 2 VIEW COMPARISON:  01/18/2017 FINDINGS: Very low volume examination with significant elevation of right hemidiaphragm similar to prior examinations and mild, diffuse interstitial pulmonary opacity, this appearance likely exaggerated by low volume technique. The heart and  mediastinal contours are not well assessed. Aortic atherosclerosis. Disc degenerative disease of the thoracic spine. IMPRESSION: Very low volume examination with significant elevation of the right hemidiaphragm similar to prior examinations and mild, diffuse interstitial pulmonary opacity, this appearance possibly reflecting mild edema although likely exaggerated by low volume technique. No focal airspace opacity appreciated. Electronically Signed   By: Dorna Bloom.D.  On: 05/12/2019 20:17    Pending Labs Unresulted Labs (From admission, onward)    Start     Ordered   05/12/19 2245  SARS CORONAVIRUS 2 (TAT 6-24 HRS) Nasopharyngeal Nasopharyngeal Swab  (Asymptomatic/Tier 2 Patients Labs)  ONCE - STAT,   STAT    Question Answer Comment  Is this test for diagnosis or screening Screening   Symptomatic for COVID-19 as defined by CDC No   Hospitalized for COVID-19 No   Admitted to ICU for COVID-19 No   Previously tested for COVID-19 No   Resident in a congregate (group) care setting No   Employed in healthcare setting No   Pregnant No      05/12/19 2244   Signed and Held  CBC  (heparin)  Once,   R    Comments: Baseline for heparin therapy IF NOT ALREADY DRAWN.  Notify MD if PLT < 100 K.    Signed and Held   Signed and Held  Creatinine, serum  (heparin)  Once,   R    Comments: Baseline for heparin therapy IF NOT ALREADY DRAWN.    Signed and Held   Signed and Held  Basic metabolic panel  Tomorrow morning,   R     Signed and Held   Signed and Held  CBC  Tomorrow morning,   R     Signed and Held          Vitals/Pain Today's Vitals   05/12/19 1931 05/12/19 2100 05/12/19 2237 05/12/19 2300  BP: (!) 188/65 (!) 171/72  (!) 151/73  Pulse: 84 80  78  Resp: 20 20  (!) 21  Temp: 98.5 F (36.9 C) 98.1 F (36.7 C)    TempSrc: Oral Oral    SpO2: 99% 96%  97%  Weight:      Height:      PainSc:   7      Isolation Precautions No active isolations  Medications Medications  sodium  chloride flush (NS) 0.9 % injection 3 mL (3 mLs Intravenous Given 05/12/19 2236)    Mobility walks with device Low fall risk   Focused Assessments Cardiac Assessment Handoff:    Lab Results  Component Value Date   CKTOTAL 100 03/26/2014   CKMB 1.4 03/26/2014   TROPONINI 0.05 (HH) 01/18/2017   No results found for: DDIMER Does the Patient currently have chest pain? No     R Recommendations: See Admitting Provider Note  Report given to:   Additional Notes:

## 2019-05-13 NOTE — Plan of Care (Signed)
  Problem: Education: Goal: Knowledge of General Education information will improve Description: Including pain rating scale, medication(s)/side effects and non-pharmacologic comfort measures Outcome: Progressing   Problem: Clinical Measurements: Goal: Will remain free from infection Outcome: Progressing Goal: Diagnostic test results will improve Outcome: Progressing Goal: Cardiovascular complication will be avoided Outcome: Progressing   Problem: Coping: Goal: Level of anxiety will decrease Outcome: Progressing   Problem: Pain Managment: Goal: General experience of comfort will improve Outcome: Progressing   Problem: Coping: Goal: Level of anxiety will decrease Outcome: Progressing   Problem: Pain Managment: Goal: General experience of comfort will improve Outcome: Progressing   Problem: Education: Goal: Individualized Educational Video(s) Outcome: Progressing

## 2019-05-13 NOTE — Progress Notes (Signed)
[facility] NAME: Cassandra Joseph    MR#:  [id number]  DATE OF BIRTH:  06/20/1946  SUBJECTIVE:  CHIEF COMPLAINT:   Chief Complaint  Patient presents with   Chest Pain   The patient denies any chest pain or shortness of breath today. REVIEW OF SYSTEMS:  Review of Systems  Constitutional: Negative for chills, fever and malaise/fatigue.  HENT: Negative for sore throat.   Eyes: Negative for blurred vision and double vision.  Respiratory: Negative for cough, hemoptysis, shortness of breath, wheezing and stridor.   Cardiovascular: Negative for chest pain, palpitations, orthopnea and leg swelling.  Gastrointestinal: Negative for abdominal pain, blood in stool, diarrhea, melena, nausea and vomiting.  Genitourinary: Negative for dysuria, flank pain and hematuria.  Musculoskeletal: Negative for back pain and joint pain.  Skin: Negative for rash.  Neurological: Negative for dizziness, sensory change, focal weakness, seizures, loss of consciousness, weakness and headaches.  Endo/Heme/Allergies: Negative for polydipsia.  Psychiatric/Behavioral: Negative for depression. The patient is not nervous/anxious.     DRUG ALLERGIES:   Allergies  Allergen Reactions   Shellfish Allergy Shortness Of Breath   VITALS:  Blood pressure 140/61, pulse 81, temperature 98.3 F (36.8 C), resp. rate 20, height 5\' 6"  (1.676 m), weight 129.5 kg, SpO2 94 %. PHYSICAL EXAMINATION:  Physical Exam Constitutional:      General: She is not in acute distress.    Appearance: Normal appearance. She is obese.     Comments: Morbid obesity.  HENT:     Head: Normocephalic.  Eyes:     General: No scleral icterus.    Conjunctiva/sclera: Conjunctivae normal.     Pupils: Pupils are equal, round, and reactive to light.  Neck:     Musculoskeletal: Normal range of motion and neck supple.     Vascular: No JVD.     Trachea: No tracheal deviation.  Cardiovascular:     Rate and Rhythm: Normal rate and regular rhythm.     Heart sounds: Normal heart sounds. No murmur. No gallop.   Pulmonary:     Effort: Pulmonary effort is normal. No respiratory distress.     Breath sounds: Normal breath sounds. No wheezing or rales.  Abdominal:     General: Bowel sounds are normal. There is no distension.     Palpations: Abdomen is soft.     Tenderness: There is no abdominal tenderness. There is no rebound.  Musculoskeletal: Normal range of motion.        General: No tenderness.     Right lower leg: No edema.     Left lower leg: No edema.  Skin:    Findings: No erythema or rash.  Neurological:     General: No focal deficit present.     Mental Status: She is alert and oriented to person, place, and time.     Cranial Nerves: No cranial nerve deficit.  Psychiatric:        Mood and Affect: Mood normal.    LABORATORY PANEL:  Female CBC Recent Labs  Lab 05/13/19 0541  WBC 8.0  HGB 8.7*  HCT 28.0*  PLT 233   ------------------------------------------------------------------------------------------------------------------ Chemistries  Recent Labs  Lab 05/13/19 0541  NA 141  K 5.3*  CL 100  CO2 23  GLUCOSE 76  BUN 95*  CREATININE 13.11*  CALCIUM 7.6*   RADIOLOGY:  Dg Chest 2 View  Result Date: 05/12/2019 CLINICAL DATA:  Chest pain, dialysis EXAM: CHEST - 2 VIEW COMPARISON:  01/18/2017 FINDINGS: Very low volume examination with significant elevation of right hemidiaphragm similar to prior examinations and mild, diffuse interstitial pulmonary opacity, this appearance likely exaggerated by low volume technique. The heart and mediastinal contours are not well assessed. Aortic atherosclerosis. Disc degenerative disease of the thoracic spine. IMPRESSION: Very low volume examination with significant elevation of the right hemidiaphragm similar to prior examinations and mild, diffuse interstitial pulmonary opacity, this appearance possibly reflecting mild edema  although likely exaggerated by low volume technique. No focal airspace opacity appreciated. Electronically Signed   By: Eddie Candle M.D.   On: 05/12/2019 20:17   ASSESSMENT AND PLAN:   Acute on chronic diastolic CHF (congestive heart failure) ([place]) -patient has a history of diastolic heart failure.  Suspect that her current situation is related to mild fluid overload in conjunction with her diastolic failure.    Continue hemodialysis today.  Chest pain, atypical with mild elevated troponin. Stress test tomorrow per Dr. Ubaldo Glassing.  Hyperkalemia.  Hemodialysis today, follow-up potassium level.    ESRD on hemodialysis.  Continue dialysis today.   Essential hypertension -home dose antihypertensives   OSA -BiPAP nightly   HLD (hyperlipidemia) -home dose antilipid Anemia of chronic disease.  Stable. I discussed with Dr. Candiss Norse and Dr. Ubaldo Glassing. All the records are reviewed and case discussed with Care Management/Social Worker. Management plans discussed with the patient, family and they are in agreement.  CODE STATUS: Full Code  TOTAL TIME TAKING CARE OF THIS PATIENT: 33 minutes.   More than 50% of the time was spent in counseling/coordination of care: YES  POSSIBLE D/C IN 2 DAYS, DEPENDING ON CLINICAL CONDITION.   Demetrios Loll M.D on 05/13/2019 at 10:52 AM  Between 7am to 6pm - Pager - [phone]  After 6pm go to [email or web address] - [facility]

## 2019-05-13 NOTE — Consult Note (Signed)
Cardiology Consultation Note    Patient ID: Cassandra Joseph, MRN: [id number], DOB/AGE: July 08, 1946 74 y.o. Admit date: 05/12/2019   Date of Consult: 05/13/2019 Primary Physician: Tracie Harrier, MD Primary Cardiologist: Dr. Caren Hazy, [facility]  Chief Complaint: CHest pain Reason for Consultation: chest pain Requesting MD: Dr. Bridgett Larsson  HPI: Cassandra Joseph is a 73 y.o. female with history of moderate to severe mitral stenosis, end-stage renal disease on hemodialysis, hyperparathyroidism with pending surgery, organic sleep apnea intolerant of CPAP who was admitted with complaints of shortness of breath.  Patient is followed by cardiology at [facility] Dr. Caren Hazy.  Chest x-ray in the emergency room showed low volume examination  with mild diffuse interstitial pulmonary opacity recollecting possible mild pulmonary edema although poor technique and limited sensitivity.  Labs revealed a creatinine of 13.11.  High-sensitivity troponin 45 and 47.  BNP 629 markedly reduced from 2 years ago where it was 1475.  EKG showed sinus rhythm nonspecific ST-T wave changes.  There were no ischemic changes.  She is scheduled for hemodialysis today but complains of some chest tightness.  She had been seen by Dr. Aurea Graff approximately 1 week ago.  Preop for parathyroidectomy surgery.  This has not yet been completed.  Past Medical History:  Diagnosis Date  . 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 ESRD (end stage renal disease) ([place])    a. on HD x 11 years; b. felt to be 2/2 HTN; c. HD TTS  . History of nuclear stress test    a. 08/2015: probably normal myocardial perfusion study, no evidence for significant ischemia or scar was noted, during stress, global systolic function normal, EF 54%, RV borderline dilatation, coronary artery calcifications and marked mitral annular calcifications were noted, liver calcifications were noted on attenuation CT scan likely represent sequelae of previous granulomatous  disease  . HLD (hyperlipidemia)   . Hypertension   . Ischemic stroke ([place])    a. 03/2014  . Metabolic syndrome   . Morbid obesity ([place])   . Multiple myeloma ([facility])    a. in remission  . Osteomyelitis ([place])    a. 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 Rheumatoid arthritis ([place])   . TIA (transient ischemic attack)    a. 2014      Surgical History:  Past Surgical History:  Procedure Laterality Date  . ABDOMINAL HYSTERECTOMY    . APPENDECTOMY    . eyelid surgery    . HERNIA REPAIR    . right knee surgery       Home Meds: Prior to Admission medications   Medication Sig Start Date End Date Taking? Authorizing Provider  amLODipine (NORVASC) 10 MG tablet Take 10 mg by mouth daily. 09/27/16  Yes [provider]  aspirin EC 81 MG tablet Take 81 mg by mouth daily.   Yes [provider]  atorvastatin (LIPITOR) 40 MG tablet Take 1 tablet (40 mg total) by mouth daily. 01/21/17  Yes Vaughan Basta, MD  b complex-vitamin c-folic acid (NEPHRO-VITE) 0.8 MG TABS tablet Take 1 tablet by mouth daily. 09/27/16  Yes [provider]  carvedilol (COREG) 25 MG tablet Take 25 mg by mouth 2 (two) times daily. 06/26/13  Yes [provider]  cinacalcet (SENSIPAR) 60 MG tablet Take 60 mg by mouth 2 (two) times daily.   Yes [provider]  diclofenac sodium (VOLTAREN) 1 % GEL Apply 2 g topically 3 (three) times daily.   Yes  [provider]  doxycycline (VIBRAMYCIN) 100 MG capsule Take 100 mg by mouth 2 (two) times daily. 05/07/19 05/16/19 Yes [provider]  gabapentin (NEURONTIN) 100 MG capsule Take 100 mg by mouth 2 (two) times daily.   Yes [provider]  HYDROcodone-acetaminophen (NORCO/VICODIN) 5-325 MG tablet Take 1 tablet by mouth daily as needed for moderate pain.   Yes [provider]  irbesartan (AVAPRO) 300 MG tablet Take 300 mg by mouth daily.   Yes [provider]  sevelamer carbonate (RENVELA) 800 MG  tablet Take 800 mg by mouth 4 (four) times daily.   Yes [provider]    Inpatient Medications:  . amLODipine  10 mg Oral Daily  . aspirin EC  81 mg Oral Daily  . atorvastatin  40 mg Oral q1800  . carvedilol  25 mg Oral BID  . Chlorhexidine Gluconate Cloth  6 each Topical Q0600  . cinacalcet  60 mg Oral BID  . heparin  5,000 Units Subcutaneous Q8H  . sevelamer carbonate  800 mg Oral QID     Allergies:  Allergies  Allergen Reactions  . Shellfish Allergy Shortness Of Breath    Social History   Socioeconomic History  . Marital status: Married    Spouse name: Not on file  . Number of children: Not on file  . Years of education: Not on file  . Highest education level: Not on file  Occupational History  . Not on file  Social Needs  . Financial resource strain: Not on file  . Food insecurity    Worry: Not on file    Inability: Not on file  . Transportation needs    Medical: Not on file    Non-medical: Not on file  Tobacco Use  . Smoking status: Former Research scientist (life sciences)  . Smokeless tobacco: Never Used  . Tobacco comment: quit in 2007  Substance and Sexual Activity  . Alcohol use: No  . Drug use: No  . Sexual activity: Not Currently  Lifestyle  . Physical activity    Days per week: Not on file    Minutes per session: Not on file  . Stress: Not on file  Relationships  . Social Herbalist on phone: Not on file    Gets together: Not on file    Attends religious service: Not on file    Active member of club or organization: Not on file    Attends meetings of clubs or organizations: Not on file    Relationship status: Not on file  . Intimate partner violence    Fear of current or ex partner: Not on file    Emotionally abused: Not on file    Physically abused: Not on file    Forced sexual activity: Not on file  Other Topics Concern  . Not on file  Social History Narrative   Independent at baseline, ambulates with a walker     Family History  Problem  Relation Age of Onset  . Seizures Mother   . Hypertension Mother   . Throat cancer Father   . Breast cancer Neg Hx      Review of Systems: A 12-system review of systems was performed and is negative except as noted in the HPI.  Labs: No results for input(s): CKTOTAL, CKMB, TROPONINI in the last 72 hours. Lab Results  Component Value Date   WBC 8.0 05/13/2019   HGB 8.7 (L) 05/13/2019   HCT 28.0 (L) 05/13/2019   MCV  95.2 05/13/2019   PLT 233 05/13/2019    Recent Labs  Lab 05/13/19 0541  NA 141  K 5.3*  CL 100  CO2 23  BUN 95*  CREATININE 13.11*  CALCIUM 7.6*  GLUCOSE 76   Lab Results  Component Value Date   CHOL 211 (H) 11/09/2016   HDL 49 11/09/2016   LDLCALC 146 (H) 11/09/2016   TRIG 81 11/09/2016   No results found for: DDIMER  Radiology/Studies:  Dg Chest 2 View  Result Date: 05/12/2019 CLINICAL DATA:  Chest pain, dialysis EXAM: CHEST - 2 VIEW COMPARISON:  01/18/2017 FINDINGS: Very low volume examination with significant elevation of right hemidiaphragm similar to prior examinations and mild, diffuse interstitial pulmonary opacity, this appearance likely exaggerated by low volume technique. The heart and mediastinal contours are not well assessed. Aortic atherosclerosis. Disc degenerative disease of the thoracic spine. IMPRESSION: Very low volume examination with significant elevation of the right hemidiaphragm similar to prior examinations and mild, diffuse interstitial pulmonary opacity, this appearance possibly reflecting mild edema although likely exaggerated by low volume technique. No focal airspace opacity appreciated. Electronically Signed   By: Eddie Candle M.D.   On: 05/12/2019 20:17    Wt Readings from Last 3 Encounters:  05/13/19 129.5 kg  01/20/17 121.7 kg  11/09/16 123 kg    EKG:    Physical Exam: Sinus rhythm with nonspecific ST-T wave changes Blood pressure 140/61, pulse 81, temperature 98.3 F (36.8 C), resp. rate 20, height 5' 6"  (1.676  m), weight 129.5 kg, SpO2 94 %. Body mass index is 46.06 kg/m. General: Well developed, well nourished, in no acute distress. Head: Normocephalic, atraumatic, sclera non-icteric, no xanthomas, nares are without discharge.  Neck: Negative for carotid bruits. JVD not elevated. Lungs: Clear bilaterally to auscultation without wheezes, rales, or rhonchi. Breathing is unlabored. Heart: RRR with S1 S2. No murmurs, rubs, or gallops appreciated. Abdomen: Soft, non-tender, non-distended with normoactive bowel sounds. No hepatomegaly. No rebound/guarding. No obvious abdominal masses. Msk:  Strength and tone appear normal for age. Extremities: No clubbing or cyanosis. No edema.  Distal pedal pulses are 2+ and equal bilaterally. Neuro: Alert and oriented X 3. No facial asymmetry. No focal deficit. Moves all extremities spontaneously. Psych:  Responds to questions appropriately with a normal affect.     Assessment and Plan    73 year old female with history of end-stage renal disease as well and has moderate to severe mitral stenosis followed in [facility] who was admitted with increasing shortness of breath.  Felt to have mild pulmonary edema on chest x-ray.  Currently scheduled for hemodialysis.  Was scheduled for an outpatient functional study to re-stratify prior to parathyroid surgery.  This is pending.  She has ruled out for myocardial infarction.  She has brief episodes of chest pain which are somewhat atypical for angina.  We will proceed with hemodialysis today as scheduled and proceed with a functional study in the morning with Lexiscan stress to her wrist ratified prior to her pending surgery as well as to evaluate possible cause of her chest pain. Ivin Booty MD 05/13/2019, 8:54 AM Pager: [phone]

## 2019-05-13 NOTE — Progress Notes (Signed)
TREATMENT INITIATED GOAL TO REMOVE 1.5L OF FLUID FOR 3.5 HOUR TREATMENT, 2K BATH, ALERT AND ORIENTED NO S/S OF DISTRESS TO NOTE   05/13/19 1120  Vital Signs  Pulse Rate 85  Pulse Rate Source Monitor  Resp 17  BP 127/62  BP Location Left Arm  BP Method Automatic  Patient Position (if appropriate) Lying  Oxygen Therapy  SpO2 97 %  O2 Device Room Air  During Hemodialysis Assessment  Blood Flow Rate (mL/min) 150 mL/min  Arterial Pressure (mmHg) -30 mmHg  Venous Pressure (mmHg) 60 mmHg  Transmembrane Pressure (mmHg) 60 mmHg  Ultrafiltration Rate (mL/min) 540 mL/min  Dialysate Flow Rate (mL/min) 600 ml/min  Conductivity: Machine  13.6  HD Safety Checks Performed Yes  Dialysis Fluid Bolus Normal Saline  Bolus Amount (mL) 250 mL  Intra-Hemodialysis Comments Tx initiated

## 2019-05-13 NOTE — Progress Notes (Signed)
Frederic 05/13/19  Subjective:   LOS: 1 No intake/output data recorded. Patient known to [place] from previous admission Couldn't breathe, got SOB Couple days prior to admission Last dialysis: Friday Rt arm AVF Had angioplasty last Thursday in [place] No cough No fever Small amount of swelling in legs    Objective:  Vital signs in last 24 hours:  Temp:  [97.7 F (36.5 C)-98.5 F (36.9 C)] 98.3 F (36.8 C) (10/05 0730) Pulse Rate:  [77-84] 81 (10/05 0730) Resp:  [18-21] 20 (10/05 0730) BP: (139-188)/(58-73) 140/61 (10/05 0730) SpO2:  [94 %-99 %] 94 % (10/05 0730) Weight:  [123.4 kg-129.5 kg] 129.5 kg (10/05 0348)  Weight change:  Filed Weights   05/12/19 1923 05/13/19 0042 05/13/19 0348  Weight: 123.4 kg 129.5 kg 129.5 kg    Intake/Output:   No intake or output data in the 24 hours ending 05/13/19 0753   Physical Exam: General:  No acute distress, laying in the bed  HEENT  moist oral mucous membrane  Neck  supple  Pulm/lungs CPAP in place, clear to auscultation  CVS/Heart  regular rhythm  Abdomen:   Soft, nontender  Extremities:  No peripheral edema  Neurologic:  Alert, oriented  Skin:  Warm, dry  Access: Rt arm AVF       Basic Metabolic Panel:  Recent Labs  Lab 05/12/19 1939 05/13/19 0541  NA 139 141  K 4.9 5.3*  CL 96* 100  CO2 25 23  GLUCOSE 135* 76  BUN 84* 95*  CREATININE 12.51* 13.11*  CALCIUM 7.8* 7.6*     CBC: Recent Labs  Lab 05/12/19 1939 05/13/19 0541  WBC 8.2 8.0  HGB 9.9* 8.7*  HCT 32.4* 28.0*  MCV 97.0 95.2  PLT 249 233      Lab Results  Component Value Date   HEPBSAG Negative 01/19/2017   HEPBSAB Reactive 01/19/2017      Microbiology:  Recent Results (from the past 240 hour(s))  MRSA PCR Screening     Status: None   Collection Time: 05/13/19  1:03 AM   Specimen: Nasal Mucosa; Nasopharyngeal  Result Value Ref Range Status   MRSA by PCR NEGATIVE NEGATIVE Final   Comment:        The GeneXpert MRSA Assay (FDA approved for NASAL specimens only), is one component of a comprehensive MRSA colonization surveillance program. It is not intended to diagnose MRSA infection nor to guide or monitor treatment for MRSA infections. Performed at [facility], [address] [place], [place] [address]     Coagulation Studies: No results for input(s): LABPROT, INR in the last 72 hours.  Urinalysis: No results for input(s): COLORURINE, LABSPEC, PHURINE, GLUCOSEU, HGBUR, BILIRUBINUR, KETONESUR, PROTEINUR, UROBILINOGEN, NITRITE, LEUKOCYTESUR in the last 72 hours.  Invalid input(s): APPERANCEUR    Imaging: Dg Chest 2 View  Result Date: 05/12/2019 CLINICAL DATA:  Chest pain, dialysis EXAM: CHEST - 2 VIEW COMPARISON:  01/18/2017 FINDINGS: Very low volume examination with significant elevation of right hemidiaphragm similar to prior examinations and mild, diffuse interstitial pulmonary opacity, this appearance likely exaggerated by low volume technique. The heart and mediastinal contours are not well assessed. Aortic atherosclerosis. Disc degenerative disease of the thoracic spine. IMPRESSION: Very low volume examination with significant elevation of the right hemidiaphragm similar to prior examinations and mild, diffuse interstitial pulmonary opacity, this appearance possibly reflecting mild edema although likely exaggerated by low volume technique. No focal airspace opacity appreciated. Electronically Signed   By: Cristie Hem  Laqueta Carina M.D.   On: 05/12/2019 20:17     Medications:    . amLODipine  10 mg Oral Daily  . aspirin EC  81 mg Oral Daily  . atorvastatin  40 mg Oral q1800  . carvedilol  25 mg Oral BID  . cinacalcet  60 mg Oral BID  . heparin  5,000 Units Subcutaneous Q8H  . sevelamer carbonate  800 mg Oral QID   acetaminophen **OR** acetaminophen, ondansetron **OR** ondansetron (ZOFRAN) IV  Assessment/ Plan:  73 y.o. African-American female with  end-stage renal disease on hemodialysis, rheumatoid arthritis, history of stroke, congestive heart failure, multiple myeloma, hypertension, hyperlipidemia  MWF [facility] [address] / 126.5 kg  Principal Problem:   Acute on chronic diastolic CHF (congestive heart failure) ([place]) Active Problems:   ESRD on hemodialysis ([place])   Essential hypertension   HLD (hyperlipidemia)   #.  End-stage renal disease -Normal schedule Monday Wednesday Friday -We will arrange for dialysis today; orders prepared -Changed antihypertensives to be administered at night -Patient unsure of her dry weight but will arrange UF of 2 L tolerated  # mild Hyperkalemia -Expected to improve with hemodialysis today   #. Anemia of CKD  Lab Results  Component Value Date   HGB 8.7 (L) 05/13/2019  Epogen with hemodialysis  #. SHPTH  No results found for: PTH Lab Results  Component Value Date   PHOS 4.1 01/20/2017  Phosphorus diet, monitor  #Chest pain on admission -Stress test planned for tomorrow     LOS: [place] 10/5/20207:53 [place], [place]

## 2019-05-14 ENCOUNTER — Inpatient Hospital Stay: Payer: Medicare Other

## 2019-05-14 ENCOUNTER — Encounter: Payer: Self-pay | Admitting: Radiology

## 2019-05-14 LAB — NM MYOCAR MULTI W/SPECT W/WALL MOTION / EF
Estimated workload: 1 METS
Exercise duration (min): 1 min
Exercise duration (sec): 4 s
LV dias vol: 151 mL (ref 46–106)
LV sys vol: 54 mL
Peak HR: 82 {beats}/min
Percent HR: 55 %
Rest HR: 67 {beats}/min
SDS: 0
SRS: 15
SSS: 4
TID: 1.26

## 2019-05-14 LAB — BASIC METABOLIC PANEL
Anion gap: 17 — ABNORMAL HIGH (ref 5–15)
BUN: 53 mg/dL — ABNORMAL HIGH (ref 8–23)
CO2: 25 mmol/L (ref 22–32)
Calcium: 7.7 mg/dL — ABNORMAL LOW (ref 8.9–10.3)
Chloride: 94 mmol/L — ABNORMAL LOW (ref 98–111)
Creatinine, Ser: 8.81 mg/dL — ABNORMAL HIGH (ref 0.44–1.00)
GFR calc Af Amer: 5 mL/min — ABNORMAL LOW (ref >60)
GFR calc non Af Amer: 4 mL/min — ABNORMAL LOW (ref >60)
Glucose, Bld: 90 mg/dL (ref 70–99)
Potassium: 4.5 mmol/L (ref 3.5–5.1)
Sodium: 136 mmol/L (ref 135–145)

## 2019-05-14 MED ORDER — TECHNETIUM TC 99M TETROFOSMIN IV KIT
10.0000 | PACK | Freq: Once | INTRAVENOUS | Status: AC | PRN
  Administered 2019-05-14: 10.953 via INTRAVENOUS

## 2019-05-14 MED ORDER — REGADENOSON 0.4 MG/5ML IV SOLN
0.4000 mg | Freq: Once | INTRAVENOUS | Status: AC
  Administered 2019-05-14: 0.4 mg via INTRAVENOUS
  Filled 2019-05-14: qty 5

## 2019-05-14 MED ORDER — TECHNETIUM TC 99M TETROFOSMIN IV KIT
30.0000 | PACK | Freq: Once | INTRAVENOUS | Status: AC | PRN
  Administered 2019-05-14: 33.279 via INTRAVENOUS

## 2019-05-14 NOTE — Discharge Summary (Signed)
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 on HD x 11 years; b. felt to be 2/2 HTN; c. HD TTS   History of nuclear stress test    a. 08/2015: probably normal myocardial perfusion study, no evidence for significant ischemia or scar was noted, during stress, global systolic function normal, EF 54%, RV borderline dilatation, coronary artery calcifications and marked mitral annular calcifications were noted, liver calcifications were noted on attenuation CT scan likely represent sequelae of previous granulomatous disease   HLD (hyperlipidemia)    Hypertension    Ischemic stroke ([place])    a. 01/3784   Metabolic syndrome    Morbid obesity ([place])    Multiple myeloma ([place])    a. in remission   Osteomyelitis ([place])    a. 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 2014   HOSPITAL COURSE:  Acute on chronic diastolic CHF (congestive heart failure) ([place]) -patient has a history of diastolic heart failure. Suspect that her current situation is related to mild fluid overload in conjunction with her diastolic failure.   Continue hemodialysis as scheduled.  Chest pain, atypical with mild elevated troponin. Stress test is normal and can be discharged home today Per Dr. Ubaldo Glassing.  Hyperkalemia.     Improved with hemodialysis.  ESRD on hemodialysis.  Continue dialysis as scheduled. Essential hypertension -home dose antihypertensives OSA -BiPAP nightly HLD (hyperlipidemia) -home dose antilipid Anemia of chronic disease.  Stable. Generalized weakness.  Home health and PT. I discussed with Dr. Candiss Norse and Dr. Ubaldo Glassing. DISCHARGE CONDITIONS:  Stable, discharge to home with home health and PT today. CONSULTS OBTAINED:  Treatment Team:  Teodoro Spray, MD DRUG ALLERGIES:   Allergies  Allergen Reactions   Shellfish Allergy Shortness Of Breath   DISCHARGE MEDICATIONS:   Allergies as of 05/14/2019      Reactions   Shellfish Allergy Shortness Of Breath      Medication List    TAKE these medications   amLODipine 10 MG tablet Commonly known as: NORVASC Take 10 mg by mouth daily.   aspirin EC 81 MG tablet Take 81 mg by mouth daily.   atorvastatin 40 MG tablet Commonly known as: LIPITOR Take 1 tablet (40 mg total) by mouth daily.   b complex-vitamin c-folic acid 0.8 MG Tabs tablet Take 1 tablet by mouth daily.   carvedilol 25 MG tablet Commonly known as: COREG Take 25 mg by mouth 2 (two) times daily.   cinacalcet 60 MG tablet Commonly known as: SENSIPAR Take 60 mg by mouth 2 (two) times daily.   diclofenac sodium 1 % Gel Commonly known as: VOLTAREN Apply 2 g topically 3 (three) times daily.   doxycycline 100 MG capsule Commonly known as: VIBRAMYCIN Take 100 mg by  mouth 2 (two) times daily.   gabapentin 100 MG capsule Commonly known as: NEURONTIN Take 100 mg by mouth 2 (two) times daily.   HYDROcodone-acetaminophen 5-325 MG tablet Commonly known as: NORCO/VICODIN Take 1 tablet by mouth daily as needed for moderate pain.   irbesartan 300 MG tablet Commonly known as: AVAPRO Take 300 mg by mouth daily.   sevelamer carbonate 800 MG tablet Commonly known as: RENVELA Take 800 mg by mouth 4 (four) times daily.        DISCHARGE INSTRUCTIONS:  See  AVS.  If you experience worsening of your admission symptoms, develop shortness of breath, life threatening emergency, suicidal or homicidal thoughts you must seek medical attention immediately by calling 911 or calling your MD immediately  if symptoms less severe.  You Must read complete instructions/literature along with all the possible adverse reactions/side effects for all the Medicines you take and that have been prescribed to you. Take any new Medicines after you have completely understood and accpet all the possible adverse reactions/side effects.   Please note  You were cared for by a hospitalist during your hospital stay. If you have any questions about your discharge medications or the care you received while you were in the hospital after you are discharged, you can call the unit and asked to speak with the hospitalist on call if the hospitalist that took care of you is not available. Once you are discharged, your primary care physician will handle any further medical issues. Please note that NO REFILLS for any discharge medications will be authorized once you are discharged, as it is imperative that you return to your primary care physician (or establish a relationship with a primary care physician if you do not have one) for your aftercare needs so that they can reassess your need for medications and monitor your lab values.    On the day of Discharge:  VITAL SIGNS:  Blood pressure 116/61, pulse 69, temperature (!) 97.4 F (36.3 C), temperature source Oral, resp. rate 19, height 5' 6"  (1.676 m), weight 126.4 kg, SpO2 99 %. PHYSICAL EXAMINATION:  GENERAL:  73 y.o.-year-old patient lying in the bed with no acute distress.  EYES: Pupils equal, round, reactive to light and accommodation. No scleral icterus. Extraocular muscles intact.  HEENT: Head atraumatic, normocephalic.  NECK:  Supple, no jugular venous distention. No thyroid enlargement, no tenderness.  LUNGS: Normal breath sounds  bilaterally, no wheezing, rales,rhonchi or crepitation. No use of accessory muscles of respiration.  CARDIOVASCULAR: S1, S2 normal. No murmurs, rubs, or gallops.  ABDOMEN: Soft, non-tender, non-distended. Bowel sounds present. No organomegaly or mass.  EXTREMITIES: No pedal edema, cyanosis, or clubbing.  NEUROLOGIC: Cranial nerves II through XII are intact. Muscle strength 4/5 in all extremities. Sensation intact. Gait not checked.  PSYCHIATRIC: The patient is alert and oriented x 3.  SKIN: No obvious rash, lesion, or ulcer.  DATA REVIEW:   CBC Recent Labs  Lab 05/13/19 0541  WBC 8.0  HGB 8.7*  HCT 28.0*  PLT 233    Chemistries  Recent Labs  Lab 05/14/19 0753  NA 136  K 4.5  CL 94*  CO2 25  GLUCOSE 90  BUN 53*  CREATININE 8.81*  CALCIUM 7.7*     Microbiology Results  Results for orders placed or performed during the hospital encounter of 05/12/19  SARS CORONAVIRUS 2 (TAT 6-24 HRS) Nasopharyngeal Nasopharyngeal Swab     Status: None   Collection Time: 05/12/19 10:35 PM   Specimen: Nasopharyngeal Swab  Result Value Ref Range Status   SARS Coronavirus 2 NEGATIVE NEGATIVE Final    Comment: (NOTE) SARS-CoV-2 target nucleic acids are NOT DETECTED. The SARS-CoV-2 RNA is generally detectable in upper and lower respiratory specimens during the acute phase of infection. Negative results do not preclude SARS-CoV-2 infection, do not rule out co-infections with other pathogens, and should not be used as the sole basis for treatment or other patient management decisions. Negative results must be combined with clinical observations, patient history, and epidemiological information. The expected result is Negative. Fact Sheet for Patients: SugarRoll.be Fact Sheet for Healthcare Providers: https://www.woods-mathews.com/ This test is not yet approved or cleared by the [place] FDA and  has been authorized for detection and/or  diagnosis of SARS-CoV-2 by FDA under an Emergency Use Authorization (EUA). This EUA will remain  in effect (meaning this test can be used) for the duration of the COVID-19 declaration under Section 56 4(b)(1) of the Act, 21 U.S.C. section 360bbb-3(b)(1), unless the authorization is terminated or revoked sooner. Performed at [facility], [place] [address] [place], [place] [address]   MRSA PCR Screening     Status: None   Collection Time: 05/13/19  1:03 AM   Specimen: Nasal Mucosa; Nasopharyngeal  Result Value Ref Range Status   MRSA by PCR NEGATIVE NEGATIVE Final    Comment:        The GeneXpert MRSA Assay (FDA approved for NASAL specimens only), is one component of a comprehensive MRSA colonization surveillance program. It is not intended to diagnose MRSA infection nor to guide or monitor treatment for MRSA infections. Performed at [facility], [address] [place], [place] [address]     RADIOLOGY:  Nm Myocar Multi W/spect Tamela Oddi Motion / Ef  Result Date: 05/14/2019  The study is normal.  This is a low risk study.  The left ventricular ejection fraction is normal (55-65%).  There was no ST segment deviation noted during stress.  Negative lexiscan stress Normal ef No evidence of stress induced ischemia Low risk study     Management plans discussed with the patient, family and they are in agreement.  CODE STATUS: Full Code   TOTAL TIME TAKING CARE OF THIS PATIENT: 32 minutes.    Demetrios Loll M.D on 05/14/2019 at 1:50 PM  Between 7am to 6pm - Pager - [phone]  After 6pm go to [email or web address] - Proofreader  [facility]  Office  [phone]  CC: Primary care physician; Tracie Harrier, MD   Note: This dictation was prepared with Dragon dictation along with smaller phrase technology. Any transcriptional errors that result from this process are unintentional.

## 2019-05-14 NOTE — Progress Notes (Signed)
[facility], [place] 05/14/19  Subjective:   LOS: 2 10/05 0701 - 10/06 0700 In: 480 [P.O.:480] Out: 0  Patient completed her dialysis yesterday Tolerated well Currently on room air No further chest pain Able to eat without any nausea or vomiting    Objective:  Vital signs in last 24 hours:  Temp:  [97.4 F (36.3 C)-98.9 F (37.2 C)] 97.4 F (36.3 C) (10/06 0734) Pulse Rate:  [69-88] 69 (10/06 0734) Resp:  [16-32] 19 (10/06 0734) BP: (108-146)/(51-83) 116/61 (10/06 0734) SpO2:  [91 %-99 %] 99 % (10/06 0734) Weight:  [126.4 kg] 126.4 kg (10/06 0311)  Weight change: 2.994 kg Filed Weights   05/13/19 0042 05/13/19 0348 05/14/19 0311  Weight: 129.5 kg 129.5 kg 126.4 kg    Intake/Output:    Intake/Output Summary (Last 24 hours) at 05/14/2019 1045 Last data filed at 05/14/2019 0308 Gross per 24 hour  Intake 240 ml  Output 0 ml  Net 240 ml     Physical Exam: General:  No acute distress, laying in the bed  HEENT  moist oral mucous membrane  Neck  supple  Pulm/lungs  normal breathing on room air, clear to auscultation  CVS/Heart  regular rhythm  Abdomen:   Soft, nontender  Extremities:  No peripheral edema  Neurologic:  Alert, oriented  Skin:  Warm, dry  Access: Rt arm AVF       Basic Metabolic Panel:  Recent Labs  Lab 05/12/19 1939 05/13/19 0541 05/14/19 0753  NA 139 141 136  K 4.9 5.3* 4.5  CL 96* 100 94*  CO2 25 23 25   GLUCOSE 135* 76 90  BUN 84* 95* 53*  CREATININE 12.51* 13.11* 8.81*  CALCIUM 7.8* 7.6* 7.7*  PHOS  --  8.8*  --      CBC: Recent Labs  Lab 05/12/19 1939 05/13/19 0541  WBC 8.2 8.0  HGB 9.9* 8.7*  HCT 32.4* 28.0*  MCV 97.0 95.2  PLT 249 233      Lab Results  Component Value Date   HEPBSAG Negative 01/19/2017   HEPBSAB Reactive 01/19/2017      Microbiology:  Recent Results (from the past 240 hour(s))  SARS CORONAVIRUS 2 (TAT 6-24 HRS) Nasopharyngeal Nasopharyngeal Swab     Status:  None   Collection Time: 05/12/19 10:35 PM   Specimen: Nasopharyngeal Swab  Result Value Ref Range Status   SARS Coronavirus 2 NEGATIVE NEGATIVE Final    Comment: (NOTE) SARS-CoV-2 target nucleic acids are NOT DETECTED. The SARS-CoV-2 RNA is generally detectable in upper and lower respiratory specimens during the acute phase of infection. Negative results do not preclude SARS-CoV-2 infection, do not rule out co-infections with other pathogens, and should not be used as the sole basis for treatment or other patient management decisions. Negative results must be combined with clinical observations, patient history, and epidemiological information. The expected result is Negative. Fact Sheet for Patients: SugarRoll.be Fact Sheet for Healthcare Providers: https://www.woods-mathews.com/ This test is not yet approved or cleared by the [place] FDA and  has been authorized for detection and/or diagnosis of SARS-CoV-2 by FDA under an Emergency Use Authorization (EUA). This EUA will remain  in effect (meaning this test can be used) for the duration of the COVID-19 declaration under Section 56 4(b)(1) of the Act, 21 U.S.C. section 360bbb-3(b)(1), unless the authorization is terminated or revoked sooner. Performed at [facility], [place] [address], [place], [place] [address]   MRSA PCR Screening  Status: None   Collection Time: 05/13/19  1:03 AM   Specimen: Nasal Mucosa; Nasopharyngeal  Result Value Ref Range Status   MRSA by PCR NEGATIVE NEGATIVE Final    Comment:        The GeneXpert MRSA Assay (FDA approved for NASAL specimens only), is one component of a comprehensive MRSA colonization surveillance program. It is not intended to diagnose MRSA infection nor to guide or monitor treatment for MRSA infections. Performed at [facility], [address] [place], [place] [address]     Coagulation Studies: No results for  input(s): LABPROT, INR in the last 72 hours.  Urinalysis: No results for input(s): COLORURINE, LABSPEC, PHURINE, GLUCOSEU, HGBUR, BILIRUBINUR, KETONESUR, PROTEINUR, UROBILINOGEN, NITRITE, LEUKOCYTESUR in the last 72 hours.  Invalid input(s): APPERANCEUR    Imaging: Dg Chest 2 View  Result Date: 05/12/2019 CLINICAL DATA:  Chest pain, dialysis EXAM: CHEST - 2 VIEW COMPARISON:  01/18/2017 FINDINGS: Very low volume examination with significant elevation of right hemidiaphragm similar to prior examinations and mild, diffuse interstitial pulmonary opacity, this appearance likely exaggerated by low volume technique. The heart and mediastinal contours are not well assessed. Aortic atherosclerosis. Disc degenerative disease of the thoracic spine. IMPRESSION: Very low volume examination with significant elevation of the right hemidiaphragm similar to prior examinations and mild, diffuse interstitial pulmonary opacity, this appearance possibly reflecting mild edema although likely exaggerated by low volume technique. No focal airspace opacity appreciated. Electronically Signed   By: Eddie Candle M.D.   On: 05/12/2019 20:17     Medications:    . amLODipine  10 mg Oral QHS  . aspirin EC  81 mg Oral Daily  . atorvastatin  40 mg Oral q1800  . carvedilol  25 mg Oral BID  . Chlorhexidine Gluconate Cloth  6 each Topical Q0600  . cinacalcet  60 mg Oral BID  . doxycycline  100 mg Oral Q12H  . epoetin (EPOGEN/PROCRIT) injection  4,000 Units Intravenous Q M,W,F-HD  . gabapentin  100 mg Oral BID  . heparin  5,000 Units Subcutaneous Q8H  . irbesartan  300 mg Oral QHS  . multivitamin  1 tablet Oral QHS  . sevelamer carbonate  800 mg Oral TID WC & HS   acetaminophen **OR** acetaminophen, HYDROcodone-acetaminophen, ondansetron **OR** ondansetron (ZOFRAN) IV, zolpidem  Assessment/ Plan:  73 y.o. African-American female with end-stage renal disease on hemodialysis, rheumatoid arthritis, history of stroke,  congestive heart failure, multiple myeloma, hypertension, hyperlipidemia  MWF [facility] [address] / 126.5 kg  Principal Problem:   Acute on chronic diastolic CHF (congestive heart failure) ([place]) Active Problems:   ESRD on hemodialysis ([place])   Essential hypertension   HLD (hyperlipidemia)   #.  End-stage renal disease -Normal schedule Monday Wednesday Friday -Changed antihypertensives to be administered at night -Electrolytes and volume status are acceptable.  No acute indication for dialysis today. -Routine hemodialysis on Wednesday  # mild Hyperkalemia -We will continue to monitor -Underwent dialysis on Monday   #. Anemia of CKD  Lab Results  Component Value Date   HGB 8.7 (L) 05/13/2019  Epogen with hemodialysis  #. SHPTH  No results found for: PTH Lab Results  Component Value Date   PHOS 8.8 (H) 05/13/2019  Phosphorus diet, monitor  #Chest pain on admission -Stress test planned for today     LOS: [place] 10/6/202010:45 [employer], [place]

## 2019-05-14 NOTE — Progress Notes (Signed)
IV and tele removed from patient. Discharge instructions given to patient. Verbalized understanding. No acute distress at this time. Daughter to transport patient home.

## 2019-05-14 NOTE — Evaluation (Signed)
Physical Therapy Evaluation Patient Details Name: Cassandra Joseph MRN: [id number] DOB: Jun 01, 1946 Today's Date: 05/14/2019   History of Present Illness  pt is a 73 yo female who presented to ED with complaints of chest discomfort and SHOB, noted in ED to have significant vascular congestion with mild pulmonary edema. PMH includes: ESRD, HLD, HTN, ischemic stroke, metabolic syndrome, RA, TIA  Clinical Impression  Pt is a 73 yo female admitted for above. Pt in bed upon arrival and agreed to participate with PT. Pt reports that over the last couple weeks her mobility has declined and she has not been able to walk as much and has had to go to dialysis in a wheelchair instead of walking with walker. Pt has help at home intermittently as her husband is not physically able to help her. Pt also reports that she has to get up at 3am on days she has dialysis in order to be ready in time (5am) because it takes her so long to get ready and she has been having difficulty dressing. Pt required min assist to sit EOB and stand and min guard assistance for ambulation with RW. Pt fatigued and short of breath after ambulation with SpO2 at 88% and raising to above 90% within 1-2 min with seated rest break.  Pt presents with decreased strength, balance, endurance and activity tolerance limiting functional mobility. Pt would benefit from skilled acute therapy to improve deficits. Pt would benefit from home health PT following hospital discharge in order to improve deficits, allow for return to PLOF, decrease fall risk and decrease caregiver burden. Pt educated on home health recommendation and agreed.     Follow Up Recommendations Home health PT    Equipment Recommendations  None recommended by PT    Recommendations for Other Services       Precautions / Restrictions Precautions Precautions: Fall Restrictions Weight Bearing Restrictions: No      Mobility  Bed Mobility Overal bed mobility: Needs  Assistance Bed Mobility: Supine to Sit     Supine to sit: Min assist;HOB elevated     General bed mobility comments: pt required very min assist to help with LE advancement, pt able to sit EOB otherwise independently with increased time and effort with use of handrails and HOB elevated  Transfers Overall transfer level: Needs assistance Equipment used: Rolling walker (2 wheeled) Transfers: Sit to/from Stand Sit to Stand: From elevated surface;Min assist         General transfer comment: pt required min assist to fully rise from elevated bed, pt had to utilize rocking in order to gather momentum for standing, heavy use of UEs on RW  Ambulation/Gait Ambulation/Gait assistance: Min guard Gait Distance (Feet): 20 Feet Assistive device: Rolling walker (2 wheeled) Gait Pattern/deviations: Step-through pattern;Trunk flexed;Decreased stride length Gait velocity: decreased   General Gait Details: pt ambulated in room with RW, pt relies heavily on UE support from RW, pt ambulates with slow cautious gait pattern, pt fatigues quickly with ambulation noteably short of breath after ambulation, pt reports that today was similar to how it has been the last few weeks but before that she was walking much more and much better  Stairs            Wheelchair Mobility    Modified Rankin (Stroke Patients Only)       Balance Overall balance assessment: Needs assistance Sitting-balance support: Feet supported Sitting balance-Leahy Scale: Good Sitting balance - Comments: pt steady sitting EOB     Standing  balance-Leahy Scale: Poor Standing balance comment: pt reliant on UE support on RW, pt able to maintain standing balance with single arm support on RW in order to don underwear, pt needed min assist to finish donning underwear, significant increased time and effort for donning                              Pertinent Vitals/Pain Pain Assessment: No/denies pain    Home Living  Family/patient expects to be discharged to:: Private residence Living Arrangements: Spouse/significant other(pt reports husband is handicapped and unable to assist her) Available Help at Discharge: Friend(s);Available PRN/intermittently;Family Type of Home: House Home Access: Level entry     Home Layout: One level Home Equipment: Shower seat;Wheelchair - [employer] - 2 wheels;Cane - single point      Prior Function Level of Independence: Needs assistance         Comments: pt reports shes been having more difficulty the last couple weeks, she uses a lift chair at home and it is still hard for her to get up, she has a housekeeper come 2x week, pt reports she is not able to use R hand well, pt also reports that she has to get up at 3am on days she has dialysis in order for her to be ready by 5am, pt reports it is getting difficult for her to get dressed so it takes her so much longer to get ready, pt reports she can sit on shower chair to self care but has to have daughter come and help her bathe a couple times a week, pt states if she were to fall she would not be able to get up on her own     Hand Dominance        Extremity/Trunk Assessment   Upper Extremity Assessment Upper Extremity Assessment: Generalized weakness(pt has greater difficulty with right hand)    Lower Extremity Assessment Lower Extremity Assessment: Generalized weakness    Cervical / Trunk Assessment Cervical / Trunk Assessment: Normal  Communication   Communication: No difficulties  Cognition Arousal/Alertness: Awake/alert Behavior During Therapy: WFL for tasks assessed/performed Overall Cognitive Status: Within Functional Limits for tasks assessed                                        General Comments      Exercises Other Exercises Other Exercises: pt educated on supine and seated therex to perform as HEP, pt verbalized understanding, pt performed AP, LAQ, heel/toe raises, seated  marching x 10 reps B, pt educated on supine AP, SLR, hip ABD and encouraged to perform exercises a few times a day   Assessment/Plan    PT Assessment Patient needs continued PT services  PT Problem List Decreased strength;Decreased mobility;Decreased range of motion;Decreased activity tolerance;Decreased balance;Decreased knowledge of use of DME;Cardiopulmonary status limiting activity;Obesity       PT Treatment Interventions DME instruction;Therapeutic exercise;Gait training;Balance training;Stair training;Neuromuscular re-education;Functional mobility training;Therapeutic activities;Patient/family education    PT Goals (Current goals can be found in the Care Plan section)  Acute Rehab PT Goals Patient Stated Goal: return home and back to walking more PT Goal Formulation: With patient Time For Goal Achievement: 05/28/19 Potential to Achieve Goals: Fair    Frequency Min 2X/week   Barriers to discharge Decreased caregiver support pt's spouse unable to provide assistance, children come to check on her  as able but no one is there 24/7 and no one there at night that can provide assistance    Co-evaluation               AM-PAC PT "6 Clicks" Mobility  Outcome Measure Help needed turning from your back to your side while in a flat bed without using bedrails?: A Little Help needed moving from lying on your back to sitting on the side of a flat bed without using bedrails?: A Little Help needed moving to and from a bed to a chair (including a wheelchair)?: A Little Help needed standing up from a chair using your arms (e.g., wheelchair or bedside chair)?: A Little Help needed to walk in hospital room?: A Little Help needed climbing 3-5 steps with a railing? : A Lot 6 Click Score: 17    End of Session Equipment Utilized During Treatment: Gait belt Activity Tolerance: Patient tolerated treatment well;Patient limited by fatigue Patient left: in chair;with call bell/phone within  reach;with chair alarm set Nurse Communication: Mobility status PT Visit Diagnosis: Muscle weakness (generalized) (M62.81);Difficulty in walking, not elsewhere classified (R26.2)    Time: 8366-2947 PT Time Calculation (min) (ACUTE ONLY): 28 min   Charges:   PT Evaluation $PT Eval Low Complexity: 1 Low         Nana Hoselton PT, DPT 12:40 PM,05/14/19 [phone]

## 2019-05-14 NOTE — TOC Transition Note (Addendum)
Transition of Care [facility]) - CM/SW Discharge Note   Patient Details  Name: Cassandra Joseph MRN: [id number] Date of Birth: Dec 17, 1945  [facility] CM/SW Contact:  Ross Ludwig, LCSW Phone Number: 05/14/2019, 4:05 PM   Clinical Narrative:     Patient is a 73 year old female who is alert and oriented x4.  Patient is a MWF dialysis patient, she lives with her daughter.  Patient states that she has equipment at home however she would like information about life alert.  CSW printed information about life alert and provided patient with pamphlet on information.  Patient stated she would like a cpap, CSW informed her that she will have have a sleep study completed.  Patient stated that she did and she ordered a cpap, but has not received it.  CSW told her to contact the agency again to follow up, patient was appreciative of information given.   Final next level of care: [place] Barriers to Discharge: Barriers Resolved   Patient Goals and CMS Choice Patient states their goals for this hospitalization and ongoing recovery are:: To return back home with home health. CMS Medicare.gov Compare Post Acute Care list provided to:: Patient Choice offered to / list presented to : Patient  Discharge Placement                       Discharge Plan and Services     Post Acute Care Choice: Home Health          DME Arranged: N/A DME Agency: NA       HH Arranged: RN, PT Candelaria Arenas Agency: [place] ([place]) Date [place]: 05/14/19 Time [place]: 1604 Representative spoke with at [place]: [place] ([place]) Interventions     Readmission Risk Interventions Readmission Risk Prevention Plan 05/14/2019  Transportation Screening Complete  PCP or Specialist Appt within 3-5 Days Complete  HRI or [place] Complete  Social Work Consult for [place] Planning/Counseling Complete  Palliative  Care Screening Not Applicable  Medication Review Press photographer) Complete  Some recent data might be hidden

## 2019-06-08 IMAGING — CR DG CHEST 2V
1 series · 2 of 2 positions shown · non-contrast
Comparison: 11/09/2016 and prior radiographs.

CLINICAL DATA: Shortness of breath and cough for 4 days.

EXAM:
CHEST  2 VIEW

[Series 1: w chest lat · 0.14mm/px · 2 of 2 slices shown]
[im 1/2]
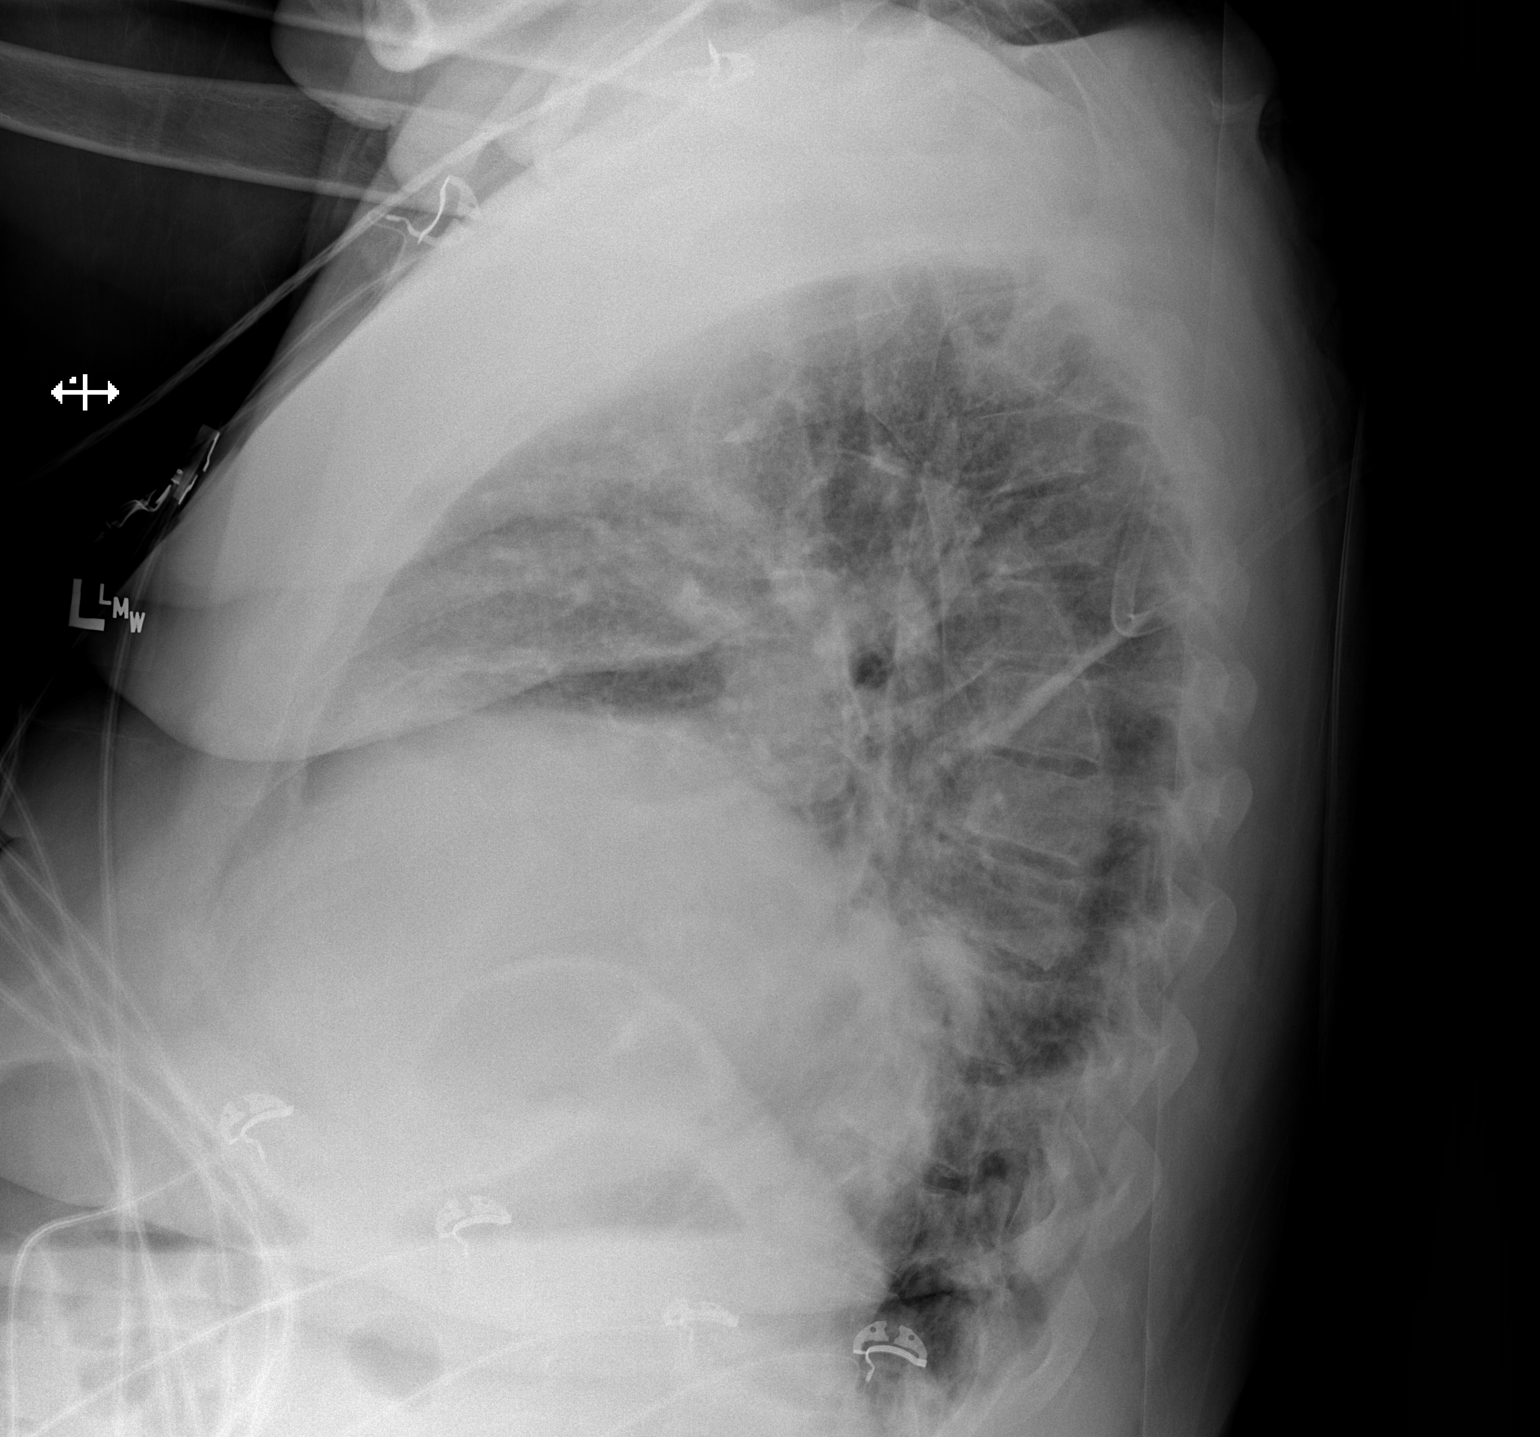
[im 2/2]
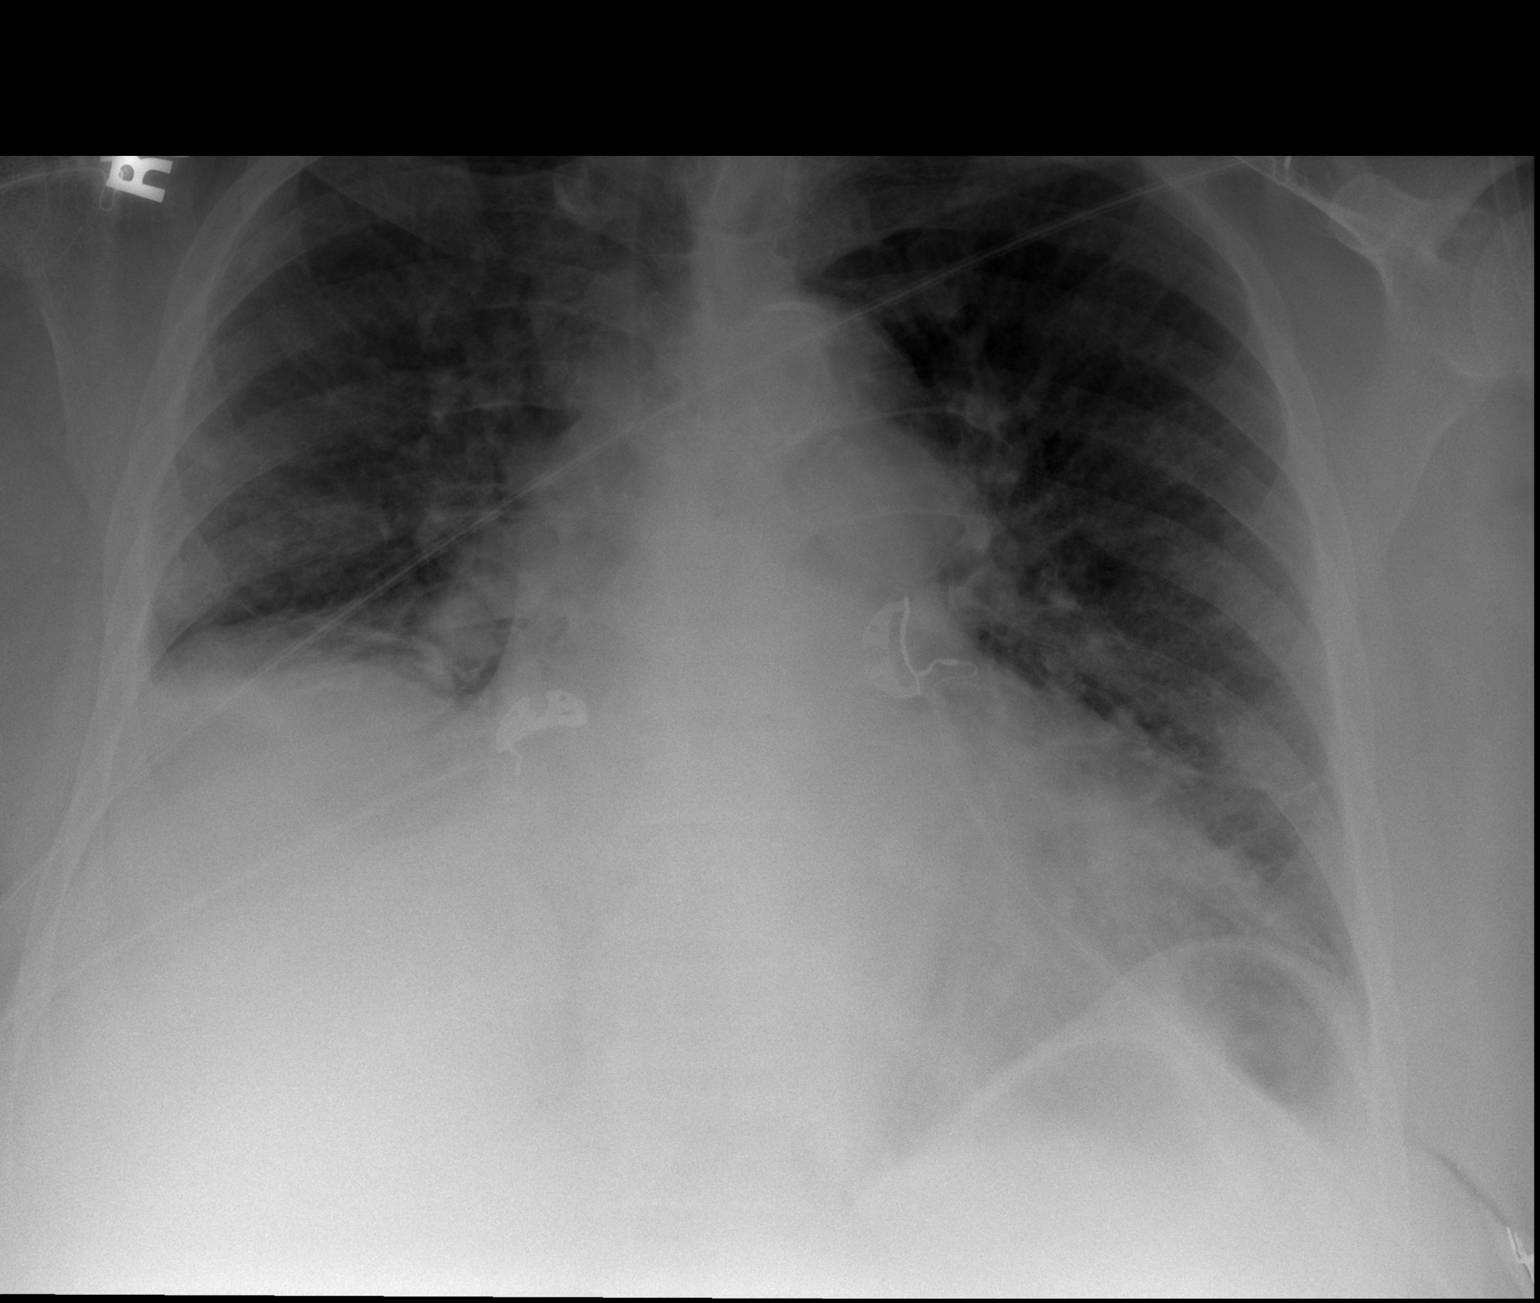

[2 of 2 positions shown; findings below may reference images not displayed]

FINDINGS: Cardiomegaly and pulmonary vascular congestion noted.

Mild bibasilar atelectasis/ scarring and elevated right
hemidiaphragm again noted.

There is no evidence of pleural effusion or pneumothorax.

No acute bony abnormalities are noted.
IMPRESSION: Cardiomegaly and pulmonary vascular congestion.

## 2019-06-23 ENCOUNTER — Emergency Department: Payer: Medicare Other

## 2019-06-23 ENCOUNTER — Other Ambulatory Visit: Payer: Self-pay

## 2019-06-23 ENCOUNTER — Inpatient Hospital Stay
Admission: EM | Admit: 2019-06-23 | Discharge: 2019-06-28 | DRG: 291 | Disposition: A | Discharge status: Home Health | Payer: Medicare Other | Attending: Internal Medicine | Admitting: Internal Medicine

## 2019-06-23 DIAGNOSIS — N2581 Secondary hyperparathyroidism of renal origin: Secondary | ICD-10-CM | POA: Diagnosis present

## 2019-06-23 DIAGNOSIS — R079 Chest pain, unspecified: Secondary | ICD-10-CM | POA: Diagnosis not present

## 2019-06-23 DIAGNOSIS — Z7982 Long term (current) use of aspirin: Secondary | ICD-10-CM

## 2019-06-23 DIAGNOSIS — I2510 Atherosclerotic heart disease of native coronary artery without angina pectoris: Secondary | ICD-10-CM | POA: Diagnosis present

## 2019-06-23 DIAGNOSIS — C9001 Multiple myeloma in remission: Secondary | ICD-10-CM | POA: Diagnosis present

## 2019-06-23 DIAGNOSIS — Z20828 Contact with and (suspected) exposure to other viral communicable diseases: Secondary | ICD-10-CM | POA: Diagnosis present

## 2019-06-23 DIAGNOSIS — G4733 Obstructive sleep apnea (adult) (pediatric): Secondary | ICD-10-CM | POA: Diagnosis present

## 2019-06-23 DIAGNOSIS — Z9079 Acquired absence of other genital organ(s): Secondary | ICD-10-CM

## 2019-06-23 DIAGNOSIS — J810 Acute pulmonary edema: Secondary | ICD-10-CM | POA: Diagnosis present

## 2019-06-23 DIAGNOSIS — N189 Chronic kidney disease, unspecified: Secondary | ICD-10-CM | POA: Diagnosis not present

## 2019-06-23 DIAGNOSIS — G629 Polyneuropathy, unspecified: Secondary | ICD-10-CM | POA: Diagnosis present

## 2019-06-23 DIAGNOSIS — Z992 Dependence on renal dialysis: Secondary | ICD-10-CM

## 2019-06-23 DIAGNOSIS — Z8249 Family history of ischemic heart disease and other diseases of the circulatory system: Secondary | ICD-10-CM

## 2019-06-23 DIAGNOSIS — Z9049 Acquired absence of other specified parts of digestive tract: Secondary | ICD-10-CM

## 2019-06-23 DIAGNOSIS — Z90710 Acquired absence of both cervix and uterus: Secondary | ICD-10-CM

## 2019-06-23 DIAGNOSIS — Z90722 Acquired absence of ovaries, bilateral: Secondary | ICD-10-CM

## 2019-06-23 DIAGNOSIS — Z79899 Other long term (current) drug therapy: Secondary | ICD-10-CM

## 2019-06-23 DIAGNOSIS — I132 Hypertensive heart and chronic kidney disease with heart failure and with stage 5 chronic kidney disease, or end stage renal disease: Principal | ICD-10-CM | POA: Diagnosis present

## 2019-06-23 DIAGNOSIS — N186 End stage renal disease: Secondary | ICD-10-CM

## 2019-06-23 DIAGNOSIS — I5033 Acute on chronic diastolic (congestive) heart failure: Secondary | ICD-10-CM

## 2019-06-23 DIAGNOSIS — I080 Rheumatic disorders of both mitral and aortic valves: Secondary | ICD-10-CM | POA: Diagnosis present

## 2019-06-23 DIAGNOSIS — Z6841 Body Mass Index (BMI) 40.0 and over, adult: Secondary | ICD-10-CM

## 2019-06-23 DIAGNOSIS — I4891 Unspecified atrial fibrillation: Secondary | ICD-10-CM

## 2019-06-23 DIAGNOSIS — Z9981 Dependence on supplemental oxygen: Secondary | ICD-10-CM | POA: Diagnosis not present

## 2019-06-23 DIAGNOSIS — I4581 Long QT syndrome: Secondary | ICD-10-CM | POA: Diagnosis present

## 2019-06-23 DIAGNOSIS — Z8673 Personal history of transient ischemic attack (TIA), and cerebral infarction without residual deficits: Secondary | ICD-10-CM

## 2019-06-23 DIAGNOSIS — R0602 Shortness of breath: Secondary | ICD-10-CM

## 2019-06-23 DIAGNOSIS — D631 Anemia in chronic kidney disease: Secondary | ICD-10-CM | POA: Diagnosis present

## 2019-06-23 DIAGNOSIS — E785 Hyperlipidemia, unspecified: Secondary | ICD-10-CM | POA: Diagnosis present

## 2019-06-23 DIAGNOSIS — I5043 Acute on chronic combined systolic (congestive) and diastolic (congestive) heart failure: Secondary | ICD-10-CM | POA: Diagnosis present

## 2019-06-23 DIAGNOSIS — M069 Rheumatoid arthritis, unspecified: Secondary | ICD-10-CM | POA: Diagnosis present

## 2019-06-23 DIAGNOSIS — D1723 Benign lipomatous neoplasm of skin and subcutaneous tissue of right leg: Secondary | ICD-10-CM | POA: Diagnosis present

## 2019-06-23 DIAGNOSIS — Z862 Personal history of diseases of the blood and blood-forming organs and certain disorders involving the immune mechanism: Secondary | ICD-10-CM | POA: Diagnosis not present

## 2019-06-23 DIAGNOSIS — Z87891 Personal history of nicotine dependence: Secondary | ICD-10-CM

## 2019-06-23 DIAGNOSIS — Z9119 Patient's noncompliance with other medical treatment and regimen: Secondary | ICD-10-CM | POA: Diagnosis not present

## 2019-06-23 DIAGNOSIS — Z89422 Acquired absence of other left toe(s): Secondary | ICD-10-CM

## 2019-06-23 LAB — COMPREHENSIVE METABOLIC PANEL
ALT: 11 U/L (ref 0–44)
AST: 22 U/L (ref 15–41)
Albumin: 2.9 g/dL — ABNORMAL LOW (ref 3.5–5.0)
Alkaline Phosphatase: 264 U/L — ABNORMAL HIGH (ref 38–126)
Anion gap: 16 — ABNORMAL HIGH (ref 5–15)
BUN: 61 mg/dL — ABNORMAL HIGH (ref 8–23)
CO2: 26 mmol/L (ref 22–32)
Calcium: 7.6 mg/dL — ABNORMAL LOW (ref 8.9–10.3)
Chloride: 99 mmol/L (ref 98–111)
Creatinine, Ser: 8.43 mg/dL — ABNORMAL HIGH (ref 0.44–1.00)
GFR calc Af Amer: 5 mL/min — ABNORMAL LOW (ref >60)
GFR calc non Af Amer: 4 mL/min — ABNORMAL LOW (ref >60)
Glucose, Bld: 92 mg/dL (ref 70–99)
Potassium: 4.7 mmol/L (ref 3.5–5.1)
Sodium: 141 mmol/L (ref 135–145)
Total Bilirubin: 1 mg/dL (ref 0.3–1.2)
Total Protein: 7.3 g/dL (ref 6.5–8.1)

## 2019-06-23 LAB — CBC WITH DIFFERENTIAL/PLATELET
Abs Immature Granulocytes: 0.03 10*3/uL (ref 0.00–0.07)
Basophils Absolute: 0.1 10*3/uL (ref 0.0–0.1)
Basophils Relative: 1 %
Eosinophils Absolute: 0.1 10*3/uL (ref 0.0–0.5)
Eosinophils Relative: 2 %
HCT: 26.7 % — ABNORMAL LOW (ref 36.0–46.0)
Hemoglobin: 8.4 g/dL — ABNORMAL LOW (ref 12.0–15.0)
Immature Granulocytes: 0 %
Lymphocytes Relative: 22 %
Lymphs Abs: 1.7 10*3/uL (ref 0.7–4.0)
MCH: 29.6 pg (ref 26.0–34.0)
MCHC: 31.5 g/dL (ref 30.0–36.0)
MCV: 94 fL (ref 80.0–100.0)
Monocytes Absolute: 0.6 10*3/uL (ref 0.1–1.0)
Monocytes Relative: 8 %
Neutro Abs: 5 10*3/uL (ref 1.7–7.7)
Neutrophils Relative %: 67 %
Platelets: 244 10*3/uL (ref 150–400)
RBC: 2.84 MIL/uL — ABNORMAL LOW (ref 3.87–5.11)
RDW: 16.1 % — ABNORMAL HIGH (ref 11.5–15.5)
WBC: 7.5 10*3/uL (ref 4.0–10.5)
nRBC: 0 % (ref 0.0–0.2)

## 2019-06-23 LAB — TROPONIN I (HIGH SENSITIVITY)
Troponin I (High Sensitivity): 23 ng/L — ABNORMAL HIGH (ref <18)
Troponin I (High Sensitivity): 26 ng/L — ABNORMAL HIGH (ref <18)

## 2019-06-23 LAB — BRAIN NATRIURETIC PEPTIDE: B Natriuretic Peptide: 1339 pg/mL — ABNORMAL HIGH (ref 0.0–100.0)

## 2019-06-23 MED ORDER — SODIUM CHLORIDE 0.9% FLUSH
3.0000 mL | Freq: Two times a day (BID) | INTRAVENOUS | Status: DC
  Administered 2019-06-24 – 2019-06-28 (×9): 3 mL via INTRAVENOUS

## 2019-06-23 MED ORDER — ATORVASTATIN CALCIUM 20 MG PO TABS
40.0000 mg | ORAL_TABLET | Freq: Every day | ORAL | Status: DC
  Filled 2019-06-23: qty 2

## 2019-06-23 MED ORDER — HEPARIN SODIUM (PORCINE) 5000 UNIT/ML IJ SOLN
5000.0000 [IU] | Freq: Three times a day (TID) | INTRAMUSCULAR | Status: DC
  Administered 2019-06-23 – 2019-06-24 (×2): 5000 [IU] via SUBCUTANEOUS
  Filled 2019-06-23 (×3): qty 1

## 2019-06-23 MED ORDER — ASPIRIN EC 81 MG PO TBEC
81.0000 mg | DELAYED_RELEASE_TABLET | Freq: Every day | ORAL | Status: DC
  Administered 2019-06-24 – 2019-06-28 (×5): 81 mg via ORAL
  Filled 2019-06-23 (×7): qty 1

## 2019-06-23 MED ORDER — ONDANSETRON HCL 4 MG/2ML IJ SOLN: 4.0000 mg | Freq: Four times a day (QID) | INTRAMUSCULAR | Status: DC | PRN

## 2019-06-23 MED ORDER — ZOLPIDEM TARTRATE 5 MG PO TABS
5.0000 mg | ORAL_TABLET | Freq: Every evening | ORAL | Status: DC | PRN
  Administered 2019-06-24 – 2019-06-26 (×3): 5 mg via ORAL
  Filled 2019-06-23 (×5): qty 1

## 2019-06-23 MED ORDER — SEVELAMER CARBONATE 800 MG PO TABS
800.0000 mg | ORAL_TABLET | Freq: Three times a day (TID) | ORAL | Status: DC
  Filled 2019-06-23 (×5): qty 1

## 2019-06-23 MED ORDER — SODIUM CHLORIDE 0.9 % IV SOLN: 250.0000 mL | INTRAVENOUS | Status: DC | PRN

## 2019-06-23 MED ORDER — ALPRAZOLAM 0.25 MG PO TABS
0.2500 mg | ORAL_TABLET | Freq: Two times a day (BID) | ORAL | Status: DC | PRN
  Administered 2019-06-27: 0.25 mg via ORAL
  Filled 2019-06-23: qty 1

## 2019-06-23 MED ORDER — SODIUM CHLORIDE 0.9% FLUSH: 3.0000 mL | INTRAVENOUS | Status: DC | PRN

## 2019-06-23 MED ORDER — GABAPENTIN 100 MG PO CAPS
100.0000 mg | ORAL_CAPSULE | Freq: Two times a day (BID) | ORAL | Status: DC
  Administered 2019-06-23 – 2019-06-28 (×10): 100 mg via ORAL
  Filled 2019-06-23 (×13): qty 1

## 2019-06-23 MED ORDER — CINACALCET HCL 30 MG PO TABS
60.0000 mg | ORAL_TABLET | Freq: Two times a day (BID) | ORAL | Status: DC
  Administered 2019-06-23 – 2019-06-28 (×10): 60 mg via ORAL
  Filled 2019-06-23 (×11): qty 2

## 2019-06-23 MED ORDER — AMLODIPINE BESYLATE 10 MG PO TABS
10.0000 mg | ORAL_TABLET | Freq: Every day | ORAL | Status: DC
  Administered 2019-06-24: 10 mg via ORAL
  Filled 2019-06-23: qty 2

## 2019-06-23 MED ORDER — ACETAMINOPHEN 325 MG PO TABS
650.0000 mg | ORAL_TABLET | ORAL | Status: DC | PRN
  Administered 2019-06-24: 650 mg via ORAL
  Filled 2019-06-23: qty 2

## 2019-06-23 MED ORDER — RENA-VITE PO TABS
1.0000 | ORAL_TABLET | Freq: Every day | ORAL | Status: DC
  Administered 2019-06-23 – 2019-06-27 (×4): 1 via ORAL
  Filled 2019-06-23 (×7): qty 1

## 2019-06-23 MED ORDER — CARVEDILOL 25 MG PO TABS
25.0000 mg | ORAL_TABLET | Freq: Two times a day (BID) | ORAL | Status: DC
  Administered 2019-06-23 – 2019-06-27 (×6): 25 mg via ORAL
  Filled 2019-06-23: qty 1
  Filled 2019-06-23 (×2): qty 2
  Filled 2019-06-23 (×5): qty 1

## 2019-06-23 MED ORDER — FUROSEMIDE 10 MG/ML IJ SOLN
40.0000 mg | Freq: Two times a day (BID) | INTRAMUSCULAR | Status: DC
  Administered 2019-06-23 – 2019-06-26 (×6): 40 mg via INTRAVENOUS
  Filled 2019-06-23 (×6): qty 4

## 2019-06-23 NOTE — ED Notes (Signed)
Daughter, [name], updated on pt plan of care.

## 2019-06-23 NOTE — ED Notes (Signed)
Pt states is still is hard for her to breathe, but it has not worsened. Pt is 97% on 3 L [place]

## 2019-06-23 NOTE — ED Notes (Signed)
Pt requesting CPAP for sleeping. Order placed and Respiratory made aware.

## 2019-06-23 NOTE — ED Notes (Signed)
Patient transported to X-ray 

## 2019-06-23 NOTE — ED Triage Notes (Signed)
Pt from home missed dialysis on Wed (R arm AV fistula). Pt states she feel SOB since this am. On 3 L  O2 [place] at home. Feels some tightness in chest. Pt appears SOB at rest. Usual dialysis M, W F.  Did go to dialysis on Friday

## 2019-06-23 NOTE — ED Provider Notes (Signed)
[facility] Emergency Department Provider Note ____________________________________________   None    (approximate)  I have reviewed the triage vital signs and the nursing notes.   HISTORY  Chief Complaint Shortness of Breath    HPI Cassandra Joseph is a 73 y.o. female with PMH as noted below including ESRD on dialysis and CHF who presents with shortness of breath, acute onset today, worse with any exertion and with trying to lie flat.  She reports some associated lightheadedness as well.  Patient states that she had some chest pain yesterday but this has resolved.  She denies any vomiting, cough, or fever.  She states she missed one dialysis session last week, but had her last dialysis session 2 days ago.  Past Medical History:  Diagnosis Date  . 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 ESRD (end stage renal disease) ([place])    a. on HD x 11 years; b. felt to be 2/2 HTN; c. HD TTS  . History of nuclear stress test    a. 08/2015: probably normal myocardial perfusion study, no evidence for significant ischemia or scar was noted, during stress, global systolic function normal, EF 54%, RV borderline dilatation, coronary artery calcifications and marked mitral annular calcifications were noted, liver calcifications were noted on attenuation CT scan likely represent sequelae of previous granulomatous disease  . HLD (hyperlipidemia)   . Hypertension   . Ischemic stroke ([place])    a. 03/2014  . Metabolic syndrome   . Morbid obesity ([place])   . Multiple myeloma (HCC)    a. in remission  . 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 Rheumatoid arthritis ([place])   . TIA (transient ischemic attack)    a. 2014    Patient Active Problem List   Diagnosis Date Noted  . Acute pulmonary edema ([place]) 06/23/2019  . Acute on chronic diastolic CHF (congestive heart failure) ([place]) 01/18/2017  . HLD (hyperlipidemia) 01/18/2017  . Atypical chest pain  11/09/2016  . ESRD on hemodialysis ([place]) 11/09/2016  . Morbid obesity ([place]) 11/09/2016  . Essential hypertension 11/09/2016  . Multiple myeloma ([place]) 11/09/2016  . Elevated troponin 11/09/2016  . Metabolic syndrome 68/61/6837    Past Surgical History:  Procedure Laterality Date  . ABDOMINAL HYSTERECTOMY    . APPENDECTOMY    . eyelid surgery    . HERNIA REPAIR    . right knee surgery      Prior to Admission medications   Medication Sig Start Date End Date Taking? Authorizing Provider  amLODipine (NORVASC) 10 MG tablet Take 10 mg by mouth daily. 09/27/16  Yes [provider]  aspirin EC 81 MG tablet Take 81 mg by mouth daily.   Yes [provider]  atorvastatin (LIPITOR) 40 MG tablet Take 1 tablet (40 mg total) by mouth daily. 01/21/17  Yes Vaughan Basta, MD  b complex-vitamin c-folic acid (NEPHRO-VITE) 0.8 MG TABS tablet Take 1 tablet by mouth daily. 09/27/16  Yes [provider]  carvedilol (COREG) 25 MG tablet Take 25 mg by mouth 2 (two) times daily. 06/26/13  Yes [provider]  cinacalcet (SENSIPAR) 60 MG tablet Take 60 mg by mouth 2 (two) times daily.   Yes [provider]  gabapentin (NEURONTIN) 100 MG capsule Take 100 mg by mouth 2 (two) times daily.   Yes [provider]  sevelamer carbonate (RENVELA) 800 MG tablet Take 800 mg by mouth 4 (four) times daily.  Yes [provider]    Allergies Shellfish allergy  Family History  Problem Relation Age of Onset  . Seizures Mother   . Hypertension Mother   . Throat cancer Father   . Breast cancer Neg Hx     Social History Social History   Tobacco Use  . Smoking status: Former Research scientist (life sciences)  . Smokeless tobacco: Never Used  . Tobacco comment: quit in 2007  Substance Use Topics  . Alcohol use: No  . Drug use: No    Review of Systems  Constitutional: No fever. Eyes: No redness. ENT: No sore throat. Cardiovascular: Positive for resolved chest pain.  Respiratory: Positive for shortness of breath. Gastrointestinal: No vomiting or diarrhea.  Genitourinary: Negative for dysuria.  Musculoskeletal: Negative for back pain. Skin: Negative for rash. Neurological: Negative for headache.   ____________________________________________   PHYSICAL EXAM:  VITAL SIGNS: ED Triage Vitals  Enc Vitals Group     BP      Pulse      Resp      Temp      Temp src      SpO2      Weight      Height      Head Circumference      Peak Flow      Pain Score      Pain Loc      Pain Edu?      Excl. in [place]?     Constitutional: Alert and oriented.  Relatively comfortable appearing and in no acute distress. Eyes: Conjunctivae are normal.  Head: Atraumatic. Nose: No congestion/rhinnorhea. Mouth/Throat: Mucous membranes are moist.   Neck: Normal range of motion.  Cardiovascular: Normal rate, regular rhythm. Grossly normal heart sounds.  Good peripheral circulation. Respiratory: Slightly increased respiratory effort.  No retractions.  Faint rales bilaterally. Gastrointestinal: No distention.  Musculoskeletal: 1+ bilateral lower extremity edema.  Extremities warm and well perfused.  Neurologic:  Normal speech and language. No gross focal neurologic deficits are appreciated.  Skin:  Skin is warm and dry. No rash noted. Psychiatric: Mood and affect are normal. Speech and behavior are normal.  ____________________________________________   LABS (all labs ordered are listed, but only abnormal results are displayed)  Labs Reviewed  COMPREHENSIVE METABOLIC PANEL - Abnormal; Notable for the following components:      Result Value   BUN 61 (*)    Creatinine, Ser 8.43 (*)    Calcium 7.6 (*)    Albumin 2.9 (*)    Alkaline Phosphatase 264 (*)    GFR calc non Af Amer 4 (*)    GFR calc Af Amer 5 (*)    Anion gap 16 (*)    All other components within normal limits  CBC WITH DIFFERENTIAL/PLATELET - Abnormal; Notable for the following components:   RBC  2.84 (*)    Hemoglobin 8.4 (*)    HCT 26.7 (*)    RDW 16.1 (*)    All other components within normal limits  BRAIN NATRIURETIC PEPTIDE - Abnormal; Notable for the following components:   B Natriuretic Peptide 1,339.0 (*)    All other components within normal limits  TROPONIN I (HIGH SENSITIVITY) - Abnormal; Notable for the following components:   Troponin I (High Sensitivity) 23 (*)    All other components within normal limits  TROPONIN I (HIGH SENSITIVITY) - Abnormal; Notable for the following components:   Troponin I (High Sensitivity) 26 (*)    All other components within normal limits  SARS CORONAVIRUS  2 (TAT 6-24 HRS)   ____________________________________________  EKG  ED ECG REPORT I, Arta Silence, the attending physician, personally viewed and interpreted this ECG.  Date: 06/23/2019 EKG Time: 1719 Rate: 91 Rhythm: Atrial fibrillation QRS Axis: normal Intervals: Prolonged QT ST/T Wave abnormalities: normal Narrative Interpretation: no evidence of acute ischemia  ____________________________________________  RADIOLOGY  CXR: Diffuse bilateral interstitial opacities  ____________________________________________   PROCEDURES  Procedure(s) performed: No  Procedures  Critical Care performed: No ____________________________________________   INITIAL IMPRESSION / ASSESSMENT AND PLAN / ED COURSE  Pertinent labs & imaging results that were available during my care of the patient were reviewed by me and considered in my medical decision making (see chart for details).  73 year old female with history of ESRD on dialysis and CHF presents with shortness of breath today, as well as some chest pain yesterday which resolved.  The patient states she has also been feeling dizzy and lightheaded.  On exam, the patient initially appeared somewhat anxious and tachypneic although after few minutes had no increased work of breathing and appeared comfortable.  O2  saturation is in the high 90s on the patient's normal 3 L by nasal cannula.  She has some faint rales bilaterally.  There is 1+ pitting edema bilaterally.  The remainder of the exam is unremarkable.  Overall I suspect most likely mild fluid overload related to ESRD/CHF.  I have a low suspicion for ACS.  The patient has no fever, cough, or other symptoms to suggest infectious etiology.  The patient does not make urine and cannot be diuresed.  She has no respiratory distress and does not require BiPAP.  We will obtain chest x-ray, lab work-up, and reassess.  ----------------------------------------- 7:40 PM on 06/23/2019 -----------------------------------------  Chest x-ray shows diffuse bilateral interstitial opacities consistent with edema, and the BNP is elevated.  The patient's other labs are unchanged from her baseline.  On reassessment, the patient does again have significant increased work of breathing although no hypoxia.  She becomes short of breath speaking full sentences.  I do not feel that she is safe to go home.  Therefore, we will admit for further monitoring and possible dialysis. ____________________________________________   FINAL CLINICAL IMPRESSION(S) / ED DIAGNOSES  Final diagnoses:  SOB (shortness of breath)      NEW MEDICATIONS STARTED DURING THIS VISIT:  New Prescriptions   No medications on file     Note:  This document was prepared using Dragon voice recognition software and may include unintentional dictation errors.   Arta Silence, MD 06/23/19 1943

## 2019-06-23 NOTE — ED Notes (Signed)
Pt resting, still SOB on exertion.

## 2019-06-23 NOTE — ED Notes (Signed)
Admitting MD at bedside.

## 2019-06-23 NOTE — H&P (Addendum)
[facility] [place] NAME: Cassandra Joseph    MR#:  [id number]  DATE OF BIRTH:  1946/06/29  DATE OF ADMISSION:  06/23/2019  PRIMARY CARE PHYSICIAN: Tracie Harrier, MD   REQUESTING/REFERRING PHYSICIAN: Arta Silence, MD  CHIEF COMPLAINT:   Chief Complaint  Patient presents with  . Shortness of Breath    HISTORY OF PRESENT ILLNESS:  Cassandra Joseph  is a 73 y.o. African-American female with a known history of multiple medical problems including end-stage renal disease on hemodialysis on Monday Wednesday Friday, who missed hemodialysis on Wednesday however had it on Friday, hypertension, diastolic CHF, obstructive sleep apnea on home CPAP, CVA and multiple myeloma, who presented to the emergency room acute onset of worsening dyspnea with associated paroxysmal nocturnal dyspnea as well as midsternal chest burning and indigestion feeling that she graded 7/10 in severity with no radiation however with palpitations since last night.  She denied any significantly worsening lower extremity edema.  She admits to orthopnea.  She has been having occasional dry cough.  No fever or chills.  No nausea or vomiting or abdominal pain.  She does not make urine.  No recent sick exposure to COVID-19.  She has been feeling right medial thigh swelling with mild tenderness.  Upon presentation to the emergency room, respiratory to 28 and pulse symmetry was 99% on 3 L O2 by nasal cannula and vital signs otherwise were within normal.  Labs revealed potassium 4.7 with BUN of 61 and creatinine of 8.43, alkaline phosphatase of 264 and albumin of 2.9 with calcium of 7.6 close to previous levels.  BNP was 1339 and high-sensitivity troponin I 23.  CBC showed hemoglobin of 8.4 hematocrit 26.7 close to baseline.  Portable chest ray showed cardiomegaly with acute pulmonary edema.  EKG showed atrial fibrillation with controlled ventricular response of 91 with PVCs and prolonged QT interval with  QTC of 509 MS. COVID-19 test is currently pending.  The patient will be admitted to a telemetry bed for further evaluation and management. PAST MEDICAL HISTORY:   Past Medical History:  Diagnosis Date  . 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 ESRD (end stage renal disease) ([place])    a. on HD x 11 years; b. felt to be 2/2 HTN; c. HD TTS  . History of nuclear stress test    a. 08/2015: probably normal myocardial perfusion study, no evidence for significant ischemia or scar was noted, during stress, global systolic function normal, EF 54%, RV borderline dilatation, coronary artery calcifications and marked mitral annular calcifications were noted, liver calcifications were noted on attenuation CT scan likely represent sequelae of previous granulomatous disease  . HLD (hyperlipidemia)   . Hypertension   . Ischemic stroke ([place])    a. 03/2014  . Metabolic syndrome   . Morbid obesity ([place])   . Multiple myeloma ([facility])    a. in remission  . Osteomyelitis ([place])    a. 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 Rheumatoid arthritis ([place])   . TIA (transient ischemic attack)    a. 2014    PAST SURGICAL HISTORY:   Past Surgical History:  Procedure Laterality Date  . ABDOMINAL HYSTERECTOMY    . APPENDECTOMY    . eyelid surgery    . HERNIA REPAIR    . right knee surgery      SOCIAL HISTORY:   Social History   Tobacco Use  . Smoking status: Former Research scientist (life sciences)  . Smokeless tobacco:  Never Used  . Tobacco comment: quit in 2007  Substance Use Topics  . Alcohol use: No    FAMILY HISTORY:   Family History  Problem Relation Age of Onset  . Seizures Mother   . Hypertension Mother   . Throat cancer Father   . Breast cancer Neg Hx     DRUG ALLERGIES:   Allergies  Allergen Reactions  . Shellfish Allergy Shortness Of Breath    REVIEW OF SYSTEMS:   ROS As per history of present illness. All pertinent systems were reviewed above. Constitutional,  HEENT, cardiovascular, respiratory, GI,  GU, musculoskeletal, neuro, psychiatric, endocrine,  integumentary and hematologic systems were reviewed and are otherwise  negative/unremarkable except for positive findings mentioned above in the HPI.   MEDICATIONS AT HOME:   Prior to Admission medications   Medication Sig Start Date End Date Taking? Authorizing Provider  amLODipine (NORVASC) 10 MG tablet Take 10 mg by mouth daily. 09/27/16  Yes [provider]  aspirin EC 81 MG tablet Take 81 mg by mouth daily.   Yes [provider]  atorvastatin (LIPITOR) 40 MG tablet Take 1 tablet (40 mg total) by mouth daily. 01/21/17  Yes Vaughan Basta, MD  b complex-vitamin c-folic acid (NEPHRO-VITE) 0.8 MG TABS tablet Take 1 tablet by mouth daily. 09/27/16  Yes [provider]  carvedilol (COREG) 25 MG tablet Take 25 mg by mouth 2 (two) times daily. 06/26/13  Yes [provider]  cinacalcet (SENSIPAR) 60 MG tablet Take 60 mg by mouth 2 (two) times daily.   Yes [provider]  gabapentin (NEURONTIN) 100 MG capsule Take 100 mg by mouth 2 (two) times daily.   Yes [provider]  sevelamer carbonate (RENVELA) 800 MG tablet Take 800 mg by mouth 4 (four) times daily.   Yes [provider]      VITAL SIGNS:  Blood pressure 122/75, pulse 93, temperature 98.5 F (36.9 C), temperature source Oral, resp. rate (!) 28, height 5' 6"  (1.676 m), weight 122.5 kg, SpO2 97 %.  PHYSICAL EXAMINATION:  Physical Exam  GENERAL:  73 y.o.-year-old African-American female patient lying in the bed with mild respiratory distress with conversational dyspnea.  EYES: Pupils equal, round, reactive to light and accommodation. No scleral icterus. Extraocular muscles intact.  HEENT: Head atraumatic, normocephalic. Oropharynx and nasopharynx clear.  NECK:  Supple, no jugular venous distention. No thyroid enlargement, no tenderness.  LUNGS: Diminished bibasilar breath sounds with bibasal rales. CARDIOVASCULAR:  Regular rate and rhythm, S1, S2 normal. No murmurs, rubs, or gallops.  ABDOMEN: Soft, nondistended, nontender. Bowel sounds present. No organomegaly or mass.  EXTREMITIES: 1+ bilateral lower extremity pitting edema with no cyanosis, or clubbing.  NEUROLOGIC: Cranial nerves II through XII are intact. Muscle strength 5/5 in all extremities. Sensation intact. Gait not checked.  PSYCHIATRIC: The patient is alert and oriented x 3.  Normal affect and good eye contact. SKIN: She had a palpable medial thigh firm swellings with minimal tenderness without erythema or warmth or induration.  No obvious rash.  LABORATORY PANEL:   CBC Recent Labs  Lab 06/23/19 1627  WBC 7.5  HGB 8.4*  HCT 26.7*  PLT 244   ------------------------------------------------------------------------------------------------------------------  Chemistries  Recent Labs  Lab 06/23/19 1627  NA 141  K 4.7  CL 99  CO2 26  GLUCOSE 92  BUN 61*  CREATININE 8.43*  CALCIUM 7.6*  AST 22  ALT 11  ALKPHOS 264*  BILITOT 1.0   ------------------------------------------------------------------------------------------------------------------  Cardiac Enzymes No results  for input(s): TROPONINI in the last 168 hours. ------------------------------------------------------------------------------------------------------------------  RADIOLOGY:  Dg Chest 1 View  Result Date: 06/23/2019 CLINICAL DATA:  Shortness of breath, cough, chest pain EXAM: CHEST  1 VIEW COMPARISON:  05/12/2019 FINDINGS: Low volume underpenetrated AP portable examination with similar elevation of the hemidiaphragms and diffuse bilateral interstitial opacity. No new or focal airspace opacity. Cardiomegaly. IMPRESSION: 1. Low volume underpenetrated AP portable examination with similar elevation of the hemidiaphragms and diffuse bilateral interstitial opacity. Findings are most consistent with edema. No new or focal airspace opacity. 2.  Cardiomegaly.  Electronically Signed   By: Eddie Candle M.D.   On: 06/23/2019 18:45      IMPRESSION AND PLAN:   1.  Acute on chronic diastolic congestive heart failure.  The patient had a 2D echo in 2018 revealing preserved EF with grade 2 diastolic dysfunction.  She has documented moderate to severe mitral stenosis per Dr. Ubaldo Glassing.  He will be consulted on the case and I notified him.  She will be admitted to telemetry bed.  We will place her on Lasix for pulmonary congestion as she does not diurese.  Will follow serial troponin I.  Will obtain a 2D echo for follow-up on her EF.  2.  End-stage renal disease on hemodialysis on Monday Wednesday and Friday with fluid overload.  Nephrology consultation will be obtained.  I notified Dr. Juleen China about the patient and he is aware.  This time she should be stable for hemodialysis in a.m.  We will continue her Renvela, Nephro-Vite eat and Sensipar.  3.  Atrial fibrillation with controlled ventricular response with associated chest pain.  This could be a new onset or newly diagnosed atrial fibrillation.  It has not been mentioned in her most recent cardiologist note on chart as of 2 weeks ago.  Her CHA2DS2-VASc score is 5, as she is a 73 year old female with hypertension, CHF and stroke.  She will benefit from anticoagulation given her 6.7% yearly thromboembolism rate.  I will start her on Eliquis.  We will continue Coreg.  We will follow serial cardiac enzymes.  Cardiology consult and 2D echo will be obtained as mentioned above.  4.  Right medial thigh swelling likely subcutaneous lipomas.  This can be followed up by her primary care physician on an outpatient basis.  No apparent signs of cellulitis.  5.  Obstructive sleep apnea.  We will continue her CPAP nightly.  That should help with #1.  6.  Hypertension.  We will continue her Norvasc and Coreg.  7.  Peripheral neuropathy.  We will continue Neurontin.  8.  Dyslipidemia.  We will continue statin therapy.  9.  DVT  prophylaxis.  The patient will be placed on Eliquis.   All the records are reviewed and case discussed with ED provider. The plan of care was discussed in details with the patient (and family). I answered all questions. The patient agreed to proceed with the above mentioned plan. Further management will depend upon hospital course.   CODE STATUS: Full code  TOTAL TIME TAKING CARE OF THIS PATIENT: 60 minutes.    Christel Mormon M.D on 06/23/2019 at 7:46 PM  [facility]   From 7 PM-7 AM, contact night-coverage [email or web address]  CC: Primary care physician; Tracie Harrier, MD   Note: This dictation was prepared with Dragon dictation along with smaller phrase technology. Any transcriptional errors that result from this process are unintentional.

## 2019-06-24 ENCOUNTER — Inpatient Hospital Stay
Admit: 2019-06-24 | Discharge: 2019-06-24 | Disposition: A | Discharge status: Home / Self Care | Payer: Medicare Other | Attending: Family Medicine | Admitting: Family Medicine

## 2019-06-24 DIAGNOSIS — J810 Acute pulmonary edema: Secondary | ICD-10-CM

## 2019-06-24 DIAGNOSIS — R0602 Shortness of breath: Secondary | ICD-10-CM

## 2019-06-24 LAB — BASIC METABOLIC PANEL
Anion gap: 16 — ABNORMAL HIGH (ref 5–15)
BUN: 70 mg/dL — ABNORMAL HIGH (ref 8–23)
CO2: 25 mmol/L (ref 22–32)
Calcium: 7.8 mg/dL — ABNORMAL LOW (ref 8.9–10.3)
Chloride: 100 mmol/L (ref 98–111)
Creatinine, Ser: 9.48 mg/dL — ABNORMAL HIGH (ref 0.44–1.00)
GFR calc Af Amer: 4 mL/min — ABNORMAL LOW (ref >60)
GFR calc non Af Amer: 4 mL/min — ABNORMAL LOW (ref >60)
Glucose, Bld: 85 mg/dL (ref 70–99)
Potassium: 4.8 mmol/L (ref 3.5–5.1)
Sodium: 141 mmol/L (ref 135–145)

## 2019-06-24 LAB — CBC
HCT: 24.1 % — ABNORMAL LOW (ref 36.0–46.0)
Hemoglobin: 7.5 g/dL — ABNORMAL LOW (ref 12.0–15.0)
MCH: 30.1 pg (ref 26.0–34.0)
MCHC: 31.1 g/dL (ref 30.0–36.0)
MCV: 96.8 fL (ref 80.0–100.0)
Platelets: 227 10*3/uL (ref 150–400)
RBC: 2.49 MIL/uL — ABNORMAL LOW (ref 3.87–5.11)
RDW: 16.2 % — ABNORMAL HIGH (ref 11.5–15.5)
WBC: 6.5 10*3/uL (ref 4.0–10.5)
nRBC: 0 % (ref 0.0–0.2)

## 2019-06-24 LAB — ECHOCARDIOGRAM COMPLETE
Height: 66 in
Weight: 4320 oz

## 2019-06-24 LAB — SARS CORONAVIRUS 2 (TAT 6-24 HRS): SARS Coronavirus 2: NEGATIVE

## 2019-06-24 MED ORDER — ATORVASTATIN CALCIUM 80 MG PO TABS
80.0000 mg | ORAL_TABLET | Freq: Every day | ORAL | Status: DC
  Administered 2019-06-24 – 2019-06-28 (×5): 80 mg via ORAL
  Filled 2019-06-24 (×6): qty 1

## 2019-06-24 MED ORDER — EPOETIN ALFA 4000 UNIT/ML IJ SOLN
4000.0000 [IU] | INTRAMUSCULAR | Status: DC
  Administered 2019-06-26: 4000 [IU] via INTRAVENOUS
  Filled 2019-06-24 (×2): qty 1

## 2019-06-24 MED ORDER — SEVELAMER CARBONATE 800 MG PO TABS
1600.0000 mg | ORAL_TABLET | Freq: Three times a day (TID) | ORAL | Status: DC
  Administered 2019-06-24 – 2019-06-28 (×15): 1600 mg via ORAL
  Filled 2019-06-24 (×18): qty 2

## 2019-06-24 MED ORDER — EPOETIN ALFA 10000 UNIT/ML IJ SOLN
10000.0000 [IU] | INTRAMUSCULAR | Status: DC
  Administered 2019-06-24: 10000 [IU] via INTRAVENOUS
  Filled 2019-06-24: qty 1

## 2019-06-24 MED ORDER — APIXABAN 2.5 MG PO TABS
2.5000 mg | ORAL_TABLET | Freq: Two times a day (BID) | ORAL | Status: DC
  Administered 2019-06-24 – 2019-06-28 (×8): 2.5 mg via ORAL
  Filled 2019-06-24 (×11): qty 1

## 2019-06-24 NOTE — ED Notes (Signed)
Patient denies pain and is resting comfortably.  

## 2019-06-24 NOTE — Progress Notes (Signed)
HD Tx Started    06/24/19 0950  Vital Signs  Pulse Rate (!) 110  Resp (!) 24  BP 112/60  BP Location Left Arm  BP Method Automatic  Patient Position (if appropriate) Lying  Oxygen Therapy  SpO2 100 %  O2 Device Nasal Cannula  O2 Flow Rate (L/min) 4 L/min  Pain Assessment  Pain Scale 0-10  Pain Score 0  During Hemodialysis Assessment  Blood Flow Rate (mL/min) 400 mL/min  Arterial Pressure (mmHg) -170 mmHg  Venous Pressure (mmHg) 190 mmHg  Transmembrane Pressure (mmHg) 70 mmHg  Ultrafiltration Rate (mL/min) 890 mL/min  Dialysate Flow Rate (mL/min) 600 ml/min  Conductivity: Machine  13.9  HD Safety Checks Performed Yes  Dialysis Fluid Bolus Normal Saline  Bolus Amount (mL) 250 mL  Intra-Hemodialysis Comments Tx initiated

## 2019-06-24 NOTE — ED Notes (Signed)
Pt returned from dialysis. No acute distress noted.

## 2019-06-24 NOTE — Progress Notes (Signed)
Post HD Tx    06/24/19 1245  Neurological  Level of Consciousness Alert  Orientation Level Oriented X4  Respiratory  Respiratory Pattern Regular;Unlabored  Chest Assessment Chest expansion symmetrical  Bilateral Breath Sounds Clear;Diminished  Cardiac  Pulse Irregular  Jugular Venous Distention (JVD) No  ECG Monitor Yes  Cardiac Rhythm Atrial fibrillation  Vascular  R Radial Pulse +2  L Radial Pulse +2  Integumentary  Integumentary (WDL) X  Skin Color Appropriate for ethnicity  Musculoskeletal  Musculoskeletal (WDL) X  Generalized Weakness Yes  GU Assessment  Genitourinary (WDL) X  Genitourinary Symptoms Anuria  Psychosocial  Psychosocial (WDL) WDL

## 2019-06-24 NOTE — Consult Note (Signed)
Cardiology Consultation Note    Patient ID: Cassandra Joseph, MRN: [id number], DOB/AGE: 1946/04/17 73 y.o. Admit date: 06/23/2019   Date of Consult: 06/24/2019 Primary Physician: Tracie Harrier, MD Primary Cardiologist: Dr. Ubaldo Glassing  Chief Complaint: sob Reason for Consultation: Duanne Limerick Requesting MD: Dr. Sidney Ace  HPI: Cassandra Joseph is a 73 y.o. female  who has a history of moderate to severe mitral stenosis, end-stage renal disease on hemodialysis, hyperparathyroidism with pending surgery, organic sleep apnea intolerant of CPAP who was admitted with complaints of shortness of breath. Patient had been followed by cardiology at [facility] Dr. Caren Hazy. She  is on 3 times a week hemodialysis for end-stage renal disease as well as organic sleep apnea with intolerance of CPAP. She was recently hospitalized with increasing shortness of breath. She also had some chest discomfort. She underwent a functional study which revealed preserved LV function with no reversible ischemia. Echo done in January 2020 revealed ejection fraction of 55% with LVH. Degenerative mitral valve with severe annular calcification and moderate to severe MS with no significant MR. She had sclerotic but not stenotic aortic valve. She was seen in my office 06/06/2019 and was doing fairly well.  She continued to be noncompliant with CPAP.  She states she was compliant with hemodialysis.  She was treated with amlodipine at 10 mg daily, aspirin 81 mg daily, atorvastatin 40 mg daily, carvedilol 25 mg twice daily, irbesartan 300 mg daily.  She presented to the emergency room on yesterday with complaints of increasing shortness of breath.  Chest x-ray revealed probable edema and cardiomegaly.  She ruled out for myocardial infarction with borderline elevated high-sensitivity troponins.  BNP is 1339 up from 629 1 month previously.  Creatinine was 8.43 consistent with her known end-stage renal disease.  Hemoglobin and hematocrit were 8.4  and 26.7.  EKG suggested atrial fibrillation with controlled ventricular response.  Review of electrocardiograms from her previous caregivers did not suggest evidence of this in the past.  She presented to the emergency room with increasing shortness of breath.  She dialyzes Monday Wednesday Friday.  She missed dialysis on Wednesday but had it Friday per her report.  Complaining of 7 out of 10 chest pain with no radiation with palpitations.  She had worsening lower extremity edema with dry cough.  Her knees feel pulse ox was 99% on 3 L.   Past Medical History:  Diagnosis Date  . 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 ESRD (end stage renal disease) ([place])    a. on HD x 11 years; b. felt to be 2/2 HTN; c. HD TTS  . History of nuclear stress test    a. 08/2015: probably normal myocardial perfusion study, no evidence for significant ischemia or scar was noted, during stress, global systolic function normal, EF 54%, RV borderline dilatation, coronary artery calcifications and marked mitral annular calcifications were noted, liver calcifications were noted on attenuation CT scan likely represent sequelae of previous granulomatous disease  . HLD (hyperlipidemia)   . Hypertension   . Ischemic stroke ([place])    a. 03/2014  . Metabolic syndrome   . Morbid obesity ([place])   . Multiple myeloma ([facility])    a. in remission  . Osteomyelitis ([place])    a. 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 Rheumatoid arthritis ([place])   . TIA (transient ischemic attack)    a. 2014      Surgical History:  Past Surgical History:  Procedure  Laterality Date  . ABDOMINAL HYSTERECTOMY    . APPENDECTOMY    . eyelid surgery    . HERNIA REPAIR    . right knee surgery       Home Meds: Prior to Admission medications   Medication Sig Start Date End Date Taking? Authorizing Provider  amLODipine (NORVASC) 10 MG tablet Take 10 mg by mouth daily. 09/27/16  Yes [provider]  aspirin EC 81 MG tablet Take 81 mg by mouth  daily.   Yes [provider]  atorvastatin (LIPITOR) 40 MG tablet Take 1 tablet (40 mg total) by mouth daily. 01/21/17  Yes Vaughan Basta, MD  b complex-vitamin c-folic acid (NEPHRO-VITE) 0.8 MG TABS tablet Take 1 tablet by mouth daily. 09/27/16  Yes [provider]  carvedilol (COREG) 25 MG tablet Take 25 mg by mouth 2 (two) times daily. 06/26/13  Yes [provider]  cinacalcet (SENSIPAR) 60 MG tablet Take 60 mg by mouth 2 (two) times daily.   Yes [provider]  gabapentin (NEURONTIN) 100 MG capsule Take 100 mg by mouth 2 (two) times daily.   Yes [provider]  sevelamer carbonate (RENVELA) 800 MG tablet Take 800 mg by mouth 4 (four) times daily.   Yes [provider]    Inpatient Medications:  . amLODipine  10 mg Oral Daily  . aspirin EC  81 mg Oral Daily  . atorvastatin  40 mg Oral Daily  . carvedilol  25 mg Oral BID  . cinacalcet  60 mg Oral BID WC  . furosemide  40 mg Intravenous Q12H  . gabapentin  100 mg Oral BID  . heparin  5,000 Units Subcutaneous Q8H  . multivitamin  1 tablet Oral QHS  . sevelamer carbonate  800 mg Oral TID WC & HS  . sodium chloride flush  3 mL Intravenous Q12H   . sodium chloride      Allergies:  Allergies  Allergen Reactions  . Shellfish Allergy Shortness Of Breath    Social History   Socioeconomic History  . Marital status: Married    Spouse name: Not on file  . Number of children: Not on file  . Years of education: Not on file  . Highest education level: Not on file  Occupational History  . Not on file  Social Needs  . Financial resource strain: Not on file  . Food insecurity    Worry: Not on file    Inability: Not on file  . Transportation needs    Medical: Not on file    Non-medical: Not on file  Tobacco Use  . Smoking status: Former Research scientist (life sciences)  . Smokeless tobacco: Never Used  . Tobacco comment: quit in 2007  Substance and Sexual Activity  . Alcohol use: No  . Drug  use: No  . Sexual activity: Not Currently  Lifestyle  . Physical activity    Days per week: Not on file    Minutes per session: Not on file  . Stress: Not on file  Relationships  . Social Herbalist on phone: Not on file    Gets together: Not on file    Attends religious service: Not on file    Active member of club or organization: Not on file    Attends meetings of clubs or organizations: Not on file    Relationship status: Not on file  . Intimate partner violence    Fear of current or ex partner: Not on file  Emotionally abused: Not on file    Physically abused: Not on file    Forced sexual activity: Not on file  Other Topics Concern  . Not on file  Social History Narrative   Independent at baseline, ambulates with a walker     Family History  Problem Relation Age of Onset  . Seizures Mother   . Hypertension Mother   . Throat cancer Father   . Breast cancer Neg Hx      Review of Systems: A 12-system review of systems was performed and is negative except as noted in the HPI.  Labs: No results for input(s): CKTOTAL, CKMB, TROPONINI in the last 72 hours. Lab Results  Component Value Date   WBC 7.5 06/23/2019   HGB 8.4 (L) 06/23/2019   HCT 26.7 (L) 06/23/2019   MCV 94.0 06/23/2019   PLT 244 06/23/2019    Recent Labs  Lab 06/23/19 1627 06/24/19 0535  NA 141 141  K 4.7 4.8  CL 99 100  CO2 26 25  BUN 61* 70*  CREATININE 8.43* 9.48*  CALCIUM 7.6* 7.8*  PROT 7.3  --   BILITOT 1.0  --   ALKPHOS 264*  --   ALT 11  --   AST 22  --   GLUCOSE 92 85   Lab Results  Component Value Date   CHOL 211 (H) 11/09/2016   HDL 49 11/09/2016   LDLCALC 146 (H) 11/09/2016   TRIG 81 11/09/2016   No results found for: DDIMER  Radiology/Studies:  Dg Chest 1 View  Result Date: 06/23/2019 CLINICAL DATA:  Shortness of breath, cough, chest pain EXAM: CHEST  1 VIEW COMPARISON:  05/12/2019 FINDINGS: Low volume underpenetrated AP portable examination with similar  elevation of the hemidiaphragms and diffuse bilateral interstitial opacity. No new or focal airspace opacity. Cardiomegaly. IMPRESSION: 1. Low volume underpenetrated AP portable examination with similar elevation of the hemidiaphragms and diffuse bilateral interstitial opacity. Findings are most consistent with edema. No new or focal airspace opacity. 2.  Cardiomegaly. Electronically Signed   By: Eddie Candle M.D.   On: 06/23/2019 18:45    Wt Readings from Last 3 Encounters:  06/23/19 122.5 kg  05/14/19 126.4 kg  01/20/17 121.7 kg    EKG: Atrial fibrillation with controlled ventricular response.  Physical Exam:  Blood pressure (!) 100/50, pulse 71, temperature 98.5 F (36.9 C), temperature source Oral, resp. rate 18, height 5' 6"  (1.676 m), weight 122.5 kg, SpO2 99 %. Body mass index is 43.58 kg/m. General: Well developed, well nourished, in no acute distress. Head: Normocephalic, atraumatic, sclera non-icteric, no xanthomas, nares are without discharge.  Neck: Negative for carotid bruits. JVD not elevated. Lungs: Clear bilaterally to auscultation without wheezes, rales, or rhonchi. Breathing is unlabored. Heart: Irregular regular rhythm.  Diastolic murmur not audible this morning in the ER. Abdomen: Soft, non-tender, non-distended with normoactive bowel sounds. No hepatomegaly. No rebound/guarding. No obvious abdominal masses. Msk:  Strength and tone appear normal for age. Extremities: No clubbing or cyanosis.  3-4+ pitting edema bilaterally.  Distal pedal pulses are 2 edema. Neuro: Alert and oriented X 3. No facial asymmetry. No focal deficit. Moves all extremities spontaneously. Psych:  Responds to questions appropriately with a normal affect.     Assessment and Plan  1.  Acute on chronic diastolic congestive heart failure.  The patient had a 2D echo in January 2020 at [facility] showing normal LV function with moderate to severe mitral stenosis with no significant regurgitation.  Severely  dilated left atrium.  She has ruled out for myocardial infarction with flat mildly elevated troponin consistent with CHF and renal insufficiency.  Will need hemodialysis today.  Strict compliance of this was discussed with the patient.  We will also continue with carvedilol at 25 mg twice daily furosemide 40 mg IV every 12 for now, low-sodium diet. 2.  End-stage renal disease on hemodialysis on Monday Wednesday and Friday with fluid overload.    On hemodialysis Monday Wednesday Friday.  Missed Wednesday of last week but did dialyze Friday.  Will likely need dialysis today.  Nephrology has had her on  Renvela, Nephro-Vite  and Sensipar.  Will need to continue with this.  3.  Atrial fibrillation with controlled ventricular response with associated chest pain.    This is likely newly recognized however given her severe mitral stenosis and left atrial margin is likely this is been intermittent in the past.  Review of records from [facility] where she has had majority of her care has not suggested documented A. fib. Her CHA2DS2-VASc score is 5 with an annualized stroke rate of approximately 5 to 7%.  She will need Eliquis.  We will dose this at 2.5 mg given end-stage renal disease..  We will continue Coreg at current dose as her rate is well controlled..  We will follow serial cardiac enzymes.    Rate control rather than rhythm control is likely the best option given her valvular disease and left atrial enlargement.    4.  Obstructive sleep apnea.  We will continue her CPAP nightly.  .  Patient admits compliance but suggest intermittent noncompliance.  She use  oxygen in it at home.  5.  Hypertension.  We will continue her Norvasc and Coreg.  Pressure is currently controlled.  Will need to follow how she does on dialysis.   8.  Dyslipidemia.  We will continue statin therapy.  LDL goal of 70 given diabetes and stroke.  LDL was 137 with an HDL of 55 by value in March 2020.  Will increase atorvastatin 80 mg  daily.     Signed, Teodoro Spray MD 06/24/2019, 6:38 AM Pager: [phone]

## 2019-06-24 NOTE — Plan of Care (Signed)
?  Problem: Fluid Volume: ?Goal: Compliance with measures to maintain balanced fluid volume will improve ?Outcome: Progressing ?  ?Problem: Clinical Measurements: ?Goal: Complications related to the disease process, condition or treatment will be avoided or minimized ?Outcome: Progressing ?  ?

## 2019-06-24 NOTE — Progress Notes (Signed)
Pre HD assessment   06/24/19 0945  Neurological  Level of Consciousness Alert  Orientation Level Oriented X4  Respiratory  Respiratory Pattern Regular;Unlabored  Chest Assessment Chest expansion symmetrical  Bilateral Breath Sounds Diminished  Cardiac  Pulse Irregular  Jugular Venous Distention (JVD) No  ECG Monitor Yes  Cardiac Rhythm Atrial fibrillation  Vascular  R Radial Pulse +2  L Radial Pulse +2  Integumentary  Integumentary (WDL) X  Skin Color Appropriate for ethnicity  Musculoskeletal  Musculoskeletal (WDL) X  Generalized Weakness Yes  GU Assessment  Genitourinary (WDL) X  Genitourinary Symptoms Anuria  Psychosocial  Psychosocial (WDL) WDL

## 2019-06-24 NOTE — Progress Notes (Signed)
Pre HD TX Note    06/24/19 0945  Hand-Off documentation  Report given to (Full Name) Newt Minion RN   Report received from (Full Name) Elizebeth Koller RN   Vital Signs  Temp 98.3 F (36.8 C)  Temp Source Oral  Pulse Rate (!) 110  Pulse Rate Source Monitor  Resp (!) 24  BP 112/60  BP Location Left Arm  BP Method Automatic  Patient Position (if appropriate) Lying  Oxygen Therapy  SpO2 100 %  O2 Device Nasal Cannula  O2 Flow Rate (L/min) 4 L/min  Pain Assessment  Pain Scale 0-10  Pain Score 0  Dialysis Weight  Weight 122.4 kg  Type of Weight Pre-Dialysis  Time-Out for Hemodialysis  What Procedure? HD   Pt Identifiers(min of two) First/Last Name;MRN/Account#  Correct Site? Yes  Correct Side? Yes  Correct Procedure? Yes  Consents Verified? Yes  Rad Studies Available? Yes  Safety Precautions Reviewed? Yes  Engineer, civil (consulting) Number 7  Station Number 1  UF/Alarm Test Passed  Conductivity: Meter 13.8  Conductivity: Machine  14  pH 7.4  Reverse Osmosis Main  Normal Saline Lot Number [id number]  Dialyzer Lot Number [id number]  Disposable Set Lot Number [id number]  Machine Temperature 98.6 F (37 C)  Musician and Audible Yes  Blood Lines Intact and Secured Yes  Pre Treatment Patient Checks  Vascular access used during treatment Fistula  HD catheter dressing before treatment WDL  Patient is receiving dialysis in a chair  (In bed)  Hepatitis B Surface Antigen Results Negative  Date Hepatitis B Surface Antigen Drawn 06/12/19  Hepatitis B Surface Antibody  (<10)  Date Hepatitis B Surface Antibody Drawn 06/12/19  Hemodialysis Consent Verified Yes  Hemodialysis Standing Orders Initiated Yes  ECG (Telemetry) Monitor On Yes  Prime Ordered Normal Saline  Length of  DialysisTreatment -hour(s) 3.5 Hour(s)  Dialysis Treatment Comments  (Na140)  Dialyzer Elisio 17H NR  Dialysate 3K;2.5 Ca  Dialysis Anticoagulant None  Dialysate Flow Ordered 600  Blood Flow Rate Ordered  400 mL/min  Ultrafiltration Goal 3 Liters  Pre Treatment Labs CBC  Dialysis Blood Pressure Support Ordered Albumin  Education / Care Plan  Dialysis Education Provided Yes  Documented Education in Care Plan Yes  Fistula / Graft Right Upper arm  No Placement Date or Time found.   Placed prior to admission: Yes  Orientation: Right  Access Location: Upper arm  Site Condition No complications  Fistula / Graft Assessment Thrill;Present;Bruit  Status Accessed  Drainage Description None

## 2019-06-24 NOTE — Progress Notes (Signed)
[place] [facility] [place] NAME: Cassandra Joseph    MR#:  [id number]  DATE OF BIRTH:  [birth year]  SUBJECTIVE:  CHIEF COMPLAINT:   Chief Complaint  Patient presents with  . Shortness of Breath  SOB + REVIEW OF SYSTEMS:  Review of Systems  Constitutional: Negative for diaphoresis, fever, malaise/fatigue and weight loss.  HENT: Negative for ear discharge, ear pain, hearing loss, nosebleeds, sore throat and tinnitus.   Eyes: Negative for blurred vision and pain.  Respiratory: Positive for shortness of breath. Negative for cough, hemoptysis and wheezing.   Cardiovascular: Negative for chest pain, palpitations, orthopnea and leg swelling.  Gastrointestinal: Negative for abdominal pain, blood in stool, constipation, diarrhea, heartburn, nausea and vomiting.  Genitourinary: Negative for dysuria, frequency and urgency.  Musculoskeletal: Negative for back pain and myalgias.  Skin: Negative for itching and rash.  Neurological: Negative for dizziness, tingling, tremors, focal weakness, seizures, weakness and headaches.  Psychiatric/Behavioral: Negative for depression. The patient is not nervous/anxious.     DRUG ALLERGIES:   Allergies  Allergen Reactions  . Shellfish Allergy Shortness Of Breath   VITALS:  Blood pressure (!) 117/52, pulse 75, temperature 98.7 F (37.1 C), temperature source Oral, resp. rate 20, height 5\' 6"  (1.676 m), weight 122.4 kg, SpO2 98 %. PHYSICAL EXAMINATION:  Physical Exam HENT:     Head: Normocephalic and atraumatic.  Eyes:     Conjunctiva/sclera: Conjunctivae normal.     Pupils: Pupils are equal, round, and reactive to light.  Neck:     Musculoskeletal: Normal range of motion and neck supple.     Thyroid: No thyromegaly.     Trachea: No tracheal deviation.  Cardiovascular:     Rate and Rhythm: Normal rate and regular rhythm.     Heart sounds: Normal heart sounds.  Pulmonary:     Effort: Pulmonary effort is normal. No respiratory  distress.     Breath sounds: Normal breath sounds. No wheezing.  Chest:     Chest wall: No tenderness.  Abdominal:     General: Bowel sounds are normal. There is no distension.     Palpations: Abdomen is soft.     Tenderness: There is no abdominal tenderness.  Musculoskeletal: Normal range of motion.  Skin:    General: Skin is warm and dry.     Findings: No rash.  Neurological:     Mental Status: She is alert and oriented to person, place, and time.     Cranial Nerves: No cranial nerve deficit.    LABORATORY PANEL:  Female CBC Recent Labs  Lab 06/24/19 1120  WBC 6.5  HGB 7.5*  HCT 24.1*  PLT 227   ------------------------------------------------------------------------------------------------------------------ Chemistries  Recent Labs  Lab 06/23/19 1627 06/24/19 0535  NA 141 141  K 4.7 4.8  CL 99 100  CO2 26 25  GLUCOSE 92 85  BUN 61* 70*  CREATININE 8.43* 9.48*  CALCIUM 7.6* 7.8*  AST 22  --   ALT 11  --   ALKPHOS 264*  --   BILITOT 1.0  --    RADIOLOGY:  No results found. ASSESSMENT AND PLAN:   1.  Acute on chronic diastolic congestive heart failure.   - 2D echo in 2018 revealing preserved EF with grade 2 diastolic dysfunction.  She has documented moderate to severe mitral stenosis  - Appreciate Dr. Ubaldo Glassing input.   - continue with carvedilol at 25 mg twice daily, furosemide 40 mg IV every 12 for now,  - low-sodium  diet. Repeat echo  2.  End-stage renal disease on hemodialysis on Monday Wednesday and Friday with fluid overload.  Nephrology consultation will be obtained.  I notified Dr. Juleen China about the patient and he is aware.  This time she should be stable for hemodialysis in a.m.  We will continue her Renvela, Nephro-Vite eat and Sensipar.  3.  Atrial fibrillation with controlled ventricular response with associated chest pain - new onset - Cardio recommends to start Eliquis at 2.5 mg given end-stage renal disease..  - continue Coreg at current dose as  her rate is well controlled.  4.  Right medial thigh swelling likely subcutaneous lipomas.  This can be followed up by her primary care physician on an outpatient basis.  No apparent signs of cellulitis.  5.  Obstructive sleep apnea. continue her CPAP nightly.  That should help with #1.  6.  Hypertension.  We will continue her Norvasc and Coreg.  7.  Peripheral neuropathy.  We will continue Neurontin.  8.  Dyslipidemia.  We will continue statin therapy.  9.  DVT prophylaxis.   on Eliquis.     All the records are reviewed and case discussed with Care Management/Social Worker. Management plans discussed with the patient, nursing and they are in agreement.  CODE STATUS: Full Code  TOTAL TIME TAKING CARE OF THIS PATIENT: 35 minutes.   More than 50% of the time was spent in counseling/coordination of care: YES  POSSIBLE D/C IN 1-2 DAYS, DEPENDING ON CLINICAL CONDITION.   Max Sane M.D on 06/24/2019 at 10:01 PM  Between 7am to 6pm - Pager - [phone]  After 6pm go to [email or web address] - password [email or web address]  [facility]   CC: Primary care physician; Tracie Harrier, MD  Note: This dictation was prepared with Dragon dictation along with smaller phrase technology. Any transcriptional errors that result from this process are unintentional.

## 2019-06-24 NOTE — Progress Notes (Signed)
Patient resting in bed. She has no complaints at this time. Personal items in reach. Will continue to monitor.

## 2019-06-24 NOTE — Progress Notes (Signed)
[facility], [place] 06/24/19  Subjective:   LOS: 1 No intake/output data recorded.  Last HD was on Friday Patient reports continued shortness of breath, orthopnea Yesterday she had chest pain and some burning sensation therefore decided to come for evaluation She also reports chronically decreased appetite although no nausea or vomiting She has noticed some increasing cough, increase sputum production but no fever She uses continuous oxygen and CPAP at home  Patient seen during dialysis Tolerating well    HEMODIALYSIS FLOWSHEET:  Blood Flow Rate (mL/min): 400 mL/min Arterial Pressure (mmHg): -140 mmHg Venous Pressure (mmHg): 180 mmHg Transmembrane Pressure (mmHg): 70 mmHg Ultrafiltration Rate (mL/min): 890 mL/min Dialysate Flow Rate (mL/min): 600 ml/min Conductivity: Machine : 14 Conductivity: Machine : 14 Dialysis Fluid Bolus: Normal Saline Bolus Amount (mL): 250 mL     Objective:  Vital signs in last 24 hours:  Temp:  [98.3 F (36.8 C)-98.5 F (36.9 C)] 98.3 F (36.8 C) (11/16 0945) Pulse Rate:  [67-110] 82 (11/16 1300) Resp:  [17-28] 18 (11/16 1300) BP: (100-140)/(50-85) 123/53 (11/16 1300) SpO2:  [97 %-100 %] 100 % (11/16 1300) Weight:  [122.4 kg-122.5 kg] 122.4 kg (11/16 0945)  Weight change:  Filed Weights   06/23/19 1620 06/24/19 0945  Weight: 122.5 kg 122.4 kg    Intake/Output:   No intake or output data in the 24 hours ending 06/24/19 1509   Physical Exam: General:  Obese lady, laying in the bed  HEENT  moist oral mucous membranes  Pulm/lungs  bibasilar crackles  CVS/Heart  irregular, atrial fibrillation on telemetry  Abdomen:   Obese, soft, nontender  Extremities:  1-2+ pitting edema bilaterally  Neurologic:  Alert, oriented, able to follow commands  Skin:  No acute rashes  Access:  aVF       Basic Metabolic Panel:  Recent Labs  Lab 06/23/19 1627 06/24/19 0535  NA 141 141  K 4.7 4.8  CL 99 100  CO2  26 25  GLUCOSE 92 85  BUN 61* 70*  CREATININE 8.43* 9.48*  CALCIUM 7.6* 7.8*     CBC: Recent Labs  Lab 06/23/19 1627 06/24/19 1120  WBC 7.5 6.5  NEUTROABS 5.0  --   HGB 8.4* 7.5*  HCT 26.7* 24.1*  MCV 94.0 96.8  PLT 244 227      Lab Results  Component Value Date   HEPBSAG Negative 01/19/2017   HEPBSAB Reactive 01/19/2017      Microbiology:  Recent Results (from the past 240 hour(s))  SARS CORONAVIRUS 2 (TAT 6-24 HRS) Nasopharyngeal Nasopharyngeal Swab     Status: None   Collection Time: 06/23/19  7:36 PM   Specimen: Nasopharyngeal Swab  Result Value Ref Range Status   SARS Coronavirus 2 NEGATIVE NEGATIVE Final    Comment: (NOTE) SARS-CoV-2 target nucleic acids are NOT DETECTED. The SARS-CoV-2 RNA is generally detectable in upper and lower respiratory specimens during the acute phase of infection. Negative results do not preclude SARS-CoV-2 infection, do not rule out co-infections with other pathogens, and should not be used as the sole basis for treatment or other patient management decisions. Negative results must be combined with clinical observations, patient history, and epidemiological information. The expected result is Negative. Fact Sheet for Patients: SugarRoll.be Fact Sheet for Healthcare Providers: https://www.woods-mathews.com/ This test is not yet approved or cleared by the [place] FDA and  has been authorized for detection and/or diagnosis of SARS-CoV-2 by FDA under an Emergency Use Authorization (EUA). This EUA will remain  in  effect (meaning this test can be used) for the duration of the COVID-19 declaration under Section 56 4(b)(1) of the Act, 21 U.S.C. section 360bbb-3(b)(1), unless the authorization is terminated or revoked sooner. Performed at [facility], [place] [address], [place], [place] [address]     Coagulation Studies: No results for input(s): LABPROT, INR in the last 72  hours.  Urinalysis: No results for input(s): COLORURINE, LABSPEC, PHURINE, GLUCOSEU, HGBUR, BILIRUBINUR, KETONESUR, PROTEINUR, UROBILINOGEN, NITRITE, LEUKOCYTESUR in the last 72 hours.  Invalid input(s): APPERANCEUR    Imaging: Dg Chest 1 View  Result Date: 06/23/2019 CLINICAL DATA:  Shortness of breath, cough, chest pain EXAM: CHEST  1 VIEW COMPARISON:  05/12/2019 FINDINGS: Low volume underpenetrated AP portable examination with similar elevation of the hemidiaphragms and diffuse bilateral interstitial opacity. No new or focal airspace opacity. Cardiomegaly. IMPRESSION: 1. Low volume underpenetrated AP portable examination with similar elevation of the hemidiaphragms and diffuse bilateral interstitial opacity. Findings are most consistent with edema. No new or focal airspace opacity. 2.  Cardiomegaly. Electronically Signed   By: Eddie Candle M.D.   On: 06/23/2019 18:45     Medications:   . sodium chloride     . amLODipine  10 mg Oral Daily  . apixaban  2.5 mg Oral BID  . aspirin EC  81 mg Oral Daily  . atorvastatin  80 mg Oral Daily  . carvedilol  25 mg Oral BID  . cinacalcet  60 mg Oral BID WC  . furosemide  40 mg Intravenous Q12H  . gabapentin  100 mg Oral BID  . multivitamin  1 tablet Oral QHS  . sevelamer carbonate  800 mg Oral TID WC & HS  . sodium chloride flush  3 mL Intravenous Q12H   sodium chloride, acetaminophen, ALPRAZolam, ondansetron (ZOFRAN) IV, sodium chloride flush, zolpidem  Assessment/ Plan:  73 y.o. African-American female with end-stage renal disease on hemodialysis, rheumatoid arthritis, history of stroke, congestive heart failure, multiple myeloma, hypertension, hyperlipidemia, obstructive sleep apnea  MWF [facility] Nephrology [address] /   Active Problems:   Acute pulmonary edema ([place])   #. ESRD with volume overload and acute pulmonary edema Patient to be dialyzed on MWF schedule Seen during dialysis today.  Tolerating well Ultrafiltration  limited by lowish blood pressure.  We will try for 2.5 to 3 kg as tolerated   #. Anemia of CKD  Lab Results  Component Value Date   HGB 7.5 (L) 06/24/2019  Will schedule low dose EPO with HD  #. SHPTH  No results found for: PTH Lab Results  Component Value Date   PHOS 8.8 (H) 05/13/2019   Monitor calcium and phos level during this admission Continue sevelamer.  Increase dose to 2 tablets with each meal  Cardiac: 2D echo June 24, 2019: LVEF 60 to 65%.  Mild aortic valve sclerosis.    LOS: [place] 11/16/20203:09 [place]

## 2019-06-24 NOTE — ED Notes (Signed)
Pt to dialysis via stretcher. Pt alert and oriented. O2 @ 4L via nasal cannula. O2 sat 97%. Pt denies pain or discomfort at this time.

## 2019-06-24 NOTE — Progress Notes (Signed)
Post HD note    06/24/19 1300  Hand-Off documentation  Report given to (Full Name) Elizebeth Koller RN  Report received from (Full Name) Newt Minion RN   Vital Signs  Temp 98.8 F (37.1 C)  Temp Source Oral  Pulse Rate 82  Pulse Rate Source Monitor  Resp 18  BP (!) 123/53  Oxygen Therapy  SpO2 100 %  O2 Device Nasal Cannula  O2 Flow Rate (L/min) 4 L/min  Pain Assessment  Pain Scale 0-10  Pain Score 0  Post-Hemodialysis Assessment  Rinseback Volume (mL) 250 mL  KECN 69.3 V  Dialyzer Clearance Lightly streaked  Duration of HD Treatment -hour(s) 3.5 hour(s)  Hemodialysis Intake (mL) 500 mL  UF Total -Machine (mL) 2500 mL  Net UF (mL) 2000 mL  Tolerated HD Treatment Yes  AVG/AVF Arterial Site Held (minutes) 8 minutes  AVG/AVF Venous Site Held (minutes) 7 minutes  Fistula / Graft Right Upper arm  No Placement Date or Time found.   Placed prior to admission: Yes  Orientation: Right  Access Location: Upper arm  Site Condition No complications  Fistula / Graft Assessment Present;Thrill;Bruit  Status Deaccessed  Needle Size 15  Drainage Description None

## 2019-06-24 NOTE — Progress Notes (Signed)
*  PRELIMINARY RESULTS* Echocardiogram 2D Echocardiogram has been performed.  Sherrie Sport 06/24/2019, 9:03 AM

## 2019-06-24 NOTE — Progress Notes (Signed)
Established hemodialysis patient known at [facility] MWF 5:30, rides with [facility]. Patient stated that she missed one treatment and feels like that is why she is in the hospital. Discussed diet and fluid, patient states that she tries hard not to drink too much, discussed ways to help reduce fluid. Please contact me with any dialysis placement concerns.  [name] Dialysis Coordinator [phone]

## 2019-06-24 NOTE — Progress Notes (Signed)
HD TX completed   06/24/19 1255  Vital Signs  Pulse Rate 85  Resp 20  BP 125/63  Oxygen Therapy  SpO2 100 %  O2 Device Nasal Cannula  O2 Flow Rate (L/min) 4 L/min  Pain Assessment  Pain Scale 0-10  Pain Score 0  During Hemodialysis Assessment  Blood Flow Rate (mL/min) 400 mL/min  Arterial Pressure (mmHg) -140 mmHg  Venous Pressure (mmHg) 180 mmHg  Transmembrane Pressure (mmHg) 70 mmHg  Ultrafiltration Rate (mL/min) 890 mL/min  Dialysate Flow Rate (mL/min) 600 ml/min  Conductivity: Machine  14  HD Safety Checks Performed Yes  Intra-Hemodialysis Comments Tx completed

## 2019-06-24 NOTE — ED Notes (Signed)
Meal provided. Pt sitting up on bed eating without complaints.

## 2019-06-25 DIAGNOSIS — Z862 Personal history of diseases of the blood and blood-forming organs and certain disorders involving the immune mechanism: Secondary | ICD-10-CM

## 2019-06-25 DIAGNOSIS — N189 Chronic kidney disease, unspecified: Secondary | ICD-10-CM

## 2019-06-25 DIAGNOSIS — N186 End stage renal disease: Secondary | ICD-10-CM

## 2019-06-25 LAB — BASIC METABOLIC PANEL
Anion gap: 11 (ref 5–15)
BUN: 43 mg/dL — ABNORMAL HIGH (ref 8–23)
CO2: 27 mmol/L (ref 22–32)
Calcium: 7.7 mg/dL — ABNORMAL LOW (ref 8.9–10.3)
Chloride: 99 mmol/L (ref 98–111)
Creatinine, Ser: 6.68 mg/dL — ABNORMAL HIGH (ref 0.44–1.00)
GFR calc Af Amer: 7 mL/min — ABNORMAL LOW (ref >60)
GFR calc non Af Amer: 6 mL/min — ABNORMAL LOW (ref >60)
Glucose, Bld: 112 mg/dL — ABNORMAL HIGH (ref 70–99)
Potassium: 4.6 mmol/L (ref 3.5–5.1)
Sodium: 137 mmol/L (ref 135–145)

## 2019-06-25 LAB — MRSA PCR SCREENING: MRSA by PCR: NEGATIVE

## 2019-06-25 LAB — PREPARE RBC (CROSSMATCH)

## 2019-06-25 LAB — ABO/RH: ABO/RH(D): O POS

## 2019-06-25 MED ORDER — SODIUM CHLORIDE 0.9% IV SOLUTION: Freq: Once | INTRAVENOUS | Status: DC

## 2019-06-25 NOTE — Progress Notes (Signed)
Post HD Assessment    06/25/19 2115  Neurological  Level of Consciousness Alert  Orientation Level Oriented X4  Respiratory  Respiratory Pattern Regular;Unlabored  Chest Assessment Chest expansion symmetrical  Bilateral Breath Sounds Clear;Diminished  Cardiac  Pulse Irregular  ECG Monitor Yes  Cardiac Rhythm Atrial fibrillation  Vascular  R Radial Pulse +2  L Radial Pulse +2  Edema Generalized  Integumentary  Integumentary (WDL) WDL  Psychosocial  Psychosocial (WDL) WDL

## 2019-06-25 NOTE — Progress Notes (Signed)
Report called to 2A RN. Patient is stable and awaiting transport.

## 2019-06-25 NOTE — Progress Notes (Signed)
Pre HD Assessment    06/25/19 1830  Neurological  Level of Consciousness Alert  Orientation Level Oriented X4  Respiratory  Respiratory Pattern Regular;Unlabored  Chest Assessment Chest expansion symmetrical  Bilateral Breath Sounds Diminished  Cough None  Cardiac  Pulse Irregular  ECG Monitor Yes  Cardiac Rhythm Atrial fibrillation  Vascular  R Radial Pulse +2  L Radial Pulse +2  Edema Generalized  Integumentary  Integumentary (WDL) WDL  Psychosocial  Psychosocial (WDL) WDL

## 2019-06-25 NOTE — Progress Notes (Signed)
Post HD [facility]    06/25/19 2115  Vital Signs  Temp 98.5 F (36.9 C)  Temp Source Oral  Pulse Rate 75  Pulse Rate Source Monitor  Resp (!) 23  BP 133/74  BP Location Left Arm  BP Method Automatic  Patient Position (if appropriate) Sitting  Oxygen Therapy  SpO2 100 %  O2 Device Nasal Cannula  O2 Flow Rate (L/min) 3 L/min  Pulse Oximetry Type Continuous  Pain Assessment  Pain Scale 0-10  Pain Score 0  Dialysis Weight  Weight 120 kg  Type of Weight Post-Dialysis  Post-Hemodialysis Assessment  Rinseback Volume (mL) 250 mL  KECN 0 V  Dialyzer Clearance Lightly streaked  Duration of HD Treatment -hour(s) 2.5 hour(s)  Hemodialysis Intake (mL) 500 mL  UF Total -Machine (mL) 4004 mL  Net UF (mL) 3504 mL  Tolerated HD Treatment Yes  AVG/AVF Arterial Site Held (minutes) 10 minutes  AVG/AVF Venous Site Held (minutes) 5 minutes  Fistula / Graft Right Upper arm  No Placement Date or Time found.   Placed prior to admission: Yes  Orientation: Right  Access Location: Upper arm  Site Condition No complications  Fistula / Graft Assessment Present;Thrill;Bruit  Status Deaccessed  Drainage Description None

## 2019-06-25 NOTE — Progress Notes (Signed)
[facility] [place] NAME: Cassandra Joseph    MR#:  [id number]  DATE OF BIRTH:  [birth year]  SUBJECTIVE:  CHIEF COMPLAINT:   Chief Complaint  Patient presents with  . Shortness of Breath  SOB +, not feeling well, cough, fatigue REVIEW OF SYSTEMS:  Review of Systems  Constitutional: Negative for diaphoresis, fever, malaise/fatigue and weight loss.  HENT: Negative for ear discharge, ear pain, hearing loss, nosebleeds, sore throat and tinnitus.   Eyes: Negative for blurred vision and pain.  Respiratory: Positive for shortness of breath. Negative for cough, hemoptysis and wheezing.   Cardiovascular: Negative for chest pain, palpitations, orthopnea and leg swelling.  Gastrointestinal: Negative for abdominal pain, blood in stool, constipation, diarrhea, heartburn, nausea and vomiting.  Genitourinary: Negative for dysuria, frequency and urgency.  Musculoskeletal: Negative for back pain and myalgias.  Skin: Negative for itching and rash.  Neurological: Negative for dizziness, tingling, tremors, focal weakness, seizures, weakness and headaches.  Psychiatric/Behavioral: Negative for depression. The patient is not nervous/anxious.    DRUG ALLERGIES:   Allergies  Allergen Reactions  . Shellfish Allergy Shortness Of Breath   VITALS:  Blood pressure (!) 107/54, pulse (!) 53, temperature 98.4 F (36.9 C), temperature source Oral, resp. rate 16, height 5\' 6"  (1.676 m), weight 122.4 kg, SpO2 97 %. PHYSICAL EXAMINATION:  Physical Exam HENT:     Head: Normocephalic and atraumatic.  Eyes:     Conjunctiva/sclera: Conjunctivae normal.     Pupils: Pupils are equal, round, and reactive to light.  Neck:     Musculoskeletal: Normal range of motion and neck supple.     Thyroid: No thyromegaly.     Trachea: No tracheal deviation.  Cardiovascular:     Rate and Rhythm: Normal rate and regular rhythm.     Heart sounds: Normal heart sounds.  Pulmonary:     Effort: Pulmonary  effort is normal. No respiratory distress.     Breath sounds: Normal breath sounds. No wheezing.  Chest:     Chest wall: No tenderness.  Abdominal:     General: Bowel sounds are normal. There is no distension.     Palpations: Abdomen is soft.     Tenderness: There is no abdominal tenderness.  Musculoskeletal: Normal range of motion.  Skin:    General: Skin is warm and dry.     Findings: No rash.  Neurological:     Mental Status: She is alert and oriented to person, place, and time.     Cranial Nerves: No cranial nerve deficit.    LABORATORY PANEL:  Female CBC Recent Labs  Lab 06/24/19 1120  WBC 6.5  HGB 7.5*  HCT 24.1*  PLT 227   ------------------------------------------------------------------------------------------------------------------ Chemistries  Recent Labs  Lab 06/23/19 1627  06/25/19 0506  NA 141   < > 137  K 4.7   < > 4.6  CL 99   < > 99  CO2 26   < > 27  GLUCOSE 92   < > 112*  BUN 61*   < > 43*  CREATININE 8.43*   < > 6.68*  CALCIUM 7.6*   < > 7.7*  AST 22  --   --   ALT 11  --   --   ALKPHOS 264*  --   --   BILITOT 1.0  --   --    < > = values in this interval not displayed.   RADIOLOGY:  No results found. ASSESSMENT AND PLAN:  1.  Acute on chronic diastolic congestive heart failure.   - 2D echo in 2018 revealing preserved EF with grade 2 diastolic dysfunction.  She has documented moderate to severe mitral stenosis  - Appreciate Dr. Ubaldo Glassing input.   - continue with carvedilol at 25 mg twice daily, furosemide 40 mg IV every 12 for now  - low-sodium diet. Repeat echo shows similar finding as before  2.  End-stage renal disease on hemodialysis on Monday Wednesday and Friday with fluid overload.  Nephrology consultation will be obtained.  I notified Dr. Juleen China about the patient and he is aware.  This time she should be stable for hemodialysis in a.m.  We will continue her Renvela, Nephro-Vite eat and Sensipar.  3.  Atrial fibrillation with  controlled ventricular response with associated chest pain - new onset - Cardio started Eliquis at 2.5 mg given end-stage renal disease..  - continue Coreg at current dose as her rate is well controlled.  4.  Right medial thigh swelling likely subcutaneous lipomas.  This can be followed up by her primary care physician on an outpatient basis.  No apparent signs of cellulitis.  5.  Obstructive sleep apnea. continue her CPAP nightly.  That should help with #1.  6.  Hypertension.  continue her Norvasc and Coreg.  7.  Peripheral neuropathy.  continue Neurontin.  8.  Dyslipidemia.  continue statin therapy.  9.  Anemia of chronic kidney disease - hemoglobin down to 7.5 (was 9.9 about a month ago) -Order 1 packed red blood cell transfusion   DVT prophylaxis.   on Eliquis.     All the records are reviewed and case discussed with Care Management/Social Worker. Management plans discussed with the patient, nursing and they are in agreement.  CODE STATUS: Full Code  TOTAL TIME TAKING CARE OF THIS PATIENT: 35 minutes.   More than 50% of the time was spent in counseling/coordination of care: YES  POSSIBLE D/C IN 1-2 DAYS, DEPENDING ON CLINICAL CONDITION.   Max Sane M.D on 06/25/2019 at 4:32 PM  Between 7am to 6pm - Pager - [phone]  After 6pm go to [email or web address] - password [email or web address]  [facility]   CC: Primary care physician; Tracie Harrier, MD  Note: This dictation was prepared with Dragon dictation along with smaller phrase technology. Any transcriptional errors that result from this process are unintentional.

## 2019-06-25 NOTE — Progress Notes (Signed)
Pre HD Tx   06/25/19 1840  Vital Signs  Temp 98.5 F (36.9 C)  Temp Source Oral  Pulse Rate 74  Pulse Rate Source Monitor  Resp (!) 22  BP (!) 103/57  BP Location Left Arm  BP Method Automatic  Patient Position (if appropriate) Sitting  Oxygen Therapy  SpO2 97 %  O2 Device Nasal Cannula  O2 Flow Rate (L/min) 3 L/min  Pulse Oximetry Type Continuous  Pain Assessment  Pain Scale 0-10  Pain Score 0  Dialysis Weight  Weight 123.5 kg  Type of Weight Pre-Dialysis  Time-Out for Hemodialysis  What Procedure? HD   Pt Identifiers(min of two) First/Last Name;MRN/Account#  Correct Site? Yes  Correct Side? Yes  Correct Procedure? Yes  Consents Verified? Yes  Rad Studies Available? N/A  Safety Precautions Reviewed? Yes  Engineer, civil (consulting) Number 6  Station Number 3  UF/Alarm Test Passed  Conductivity: Meter 13.8  Conductivity: Machine  13.8  pH 7.2  Reverse Osmosis Main  Normal Saline Lot Number [id number]  Dialyzer Lot Number [id number]  Disposable Set Lot Number [id number]  Machine Temperature 98.6 F (37 C)  Musician and Audible Yes  Blood Lines Intact and Secured Yes  Pre Treatment Patient Checks  Vascular access used during treatment Fistula  Patient is receiving dialysis in a chair Yes  Hepatitis B Surface Antigen Results Negative  Date Hepatitis B Surface Antigen Drawn 06/12/19  Hepatitis B Surface Antibody 9  Date Hepatitis B Surface Antibody Drawn 05/20/19  Hemodialysis Consent Verified Yes  Hemodialysis Standing Orders Initiated Yes  ECG (Telemetry) Monitor On Yes  Prime Ordered Normal Saline  Length of  DialysisTreatment -hour(s) 2.5 Hour(s)  Dialysis Treatment Comments Sequential for  fluid removal   Dialyzer Elisio 17H NR  Dialysate Other (Comment) (Sequential Treatment - No dialysate )  Dialysis Anticoagulant None  Dialysate Flow Ordered 600  Blood Flow Rate Ordered 300 mL/min  Ultrafiltration Goal 2.5 Liters (2.5-3.0L)  Dialysis Blood Pressure  Support Ordered Normal Saline  Education / Care Plan  Dialysis Education Provided Yes  Documented Education in Care Plan Yes  Fistula / Graft Right Upper arm  No Placement Date or Time found.   Placed prior to admission: Yes  Orientation: Right  Access Location: Upper arm  Site Condition No complications  Fistula / Graft Assessment Present;Thrill;Bruit  Status Patent  Drainage Description None

## 2019-06-25 NOTE — Progress Notes (Signed)
HD TX ended    06/25/19 2100  Vital Signs  Resp (!) 22  BP (!) 79/58  BP Location Left Arm  BP Method Automatic  Patient Position (if appropriate) Sitting  Oxygen Therapy  SpO2 100 %  O2 Device Nasal Cannula  Pulse Oximetry Type Continuous  During Hemodialysis Assessment  HD Safety Checks Performed Yes  KECN 0 KECN  Dialysis Fluid Bolus Normal Saline  Bolus Amount (mL) 250 mL  Intra-Hemodialysis Comments Tx completed;Tolerated well

## 2019-06-25 NOTE — Progress Notes (Signed)
HD Tx started    06/25/19 1845  Vital Signs  Pulse Rate Source Monitor  Resp (!) 26  BP (!) 125/48  BP Location Left Arm  BP Method Automatic  Patient Position (if appropriate) Sitting  Oxygen Therapy  SpO2 97 %  O2 Device Nasal Cannula  O2 Flow Rate (L/min) 3 L/min  Pulse Oximetry Type Continuous  Pain Assessment  Pain Scale 0-10  Pain Score 0  During Hemodialysis Assessment  Blood Flow Rate (mL/min) 300 mL/min  Arterial Pressure (mmHg) -180 mmHg  Venous Pressure (mmHg) 110 mmHg  Transmembrane Pressure (mmHg) 50 mmHg  Ultrafiltration Rate (mL/min) 1600 mL/min  Dialysate Flow Rate (mL/min) 600 ml/min  Conductivity: Machine  13.8  HD Safety Checks Performed Yes  Dialysis Fluid Bolus Normal Saline  Bolus Amount (mL) 250 mL  Intra-Hemodialysis Comments Tx initiated  Fistula / Graft Right Upper arm  No Placement Date or Time found.   Placed prior to admission: Yes  Orientation: Right  Access Location: Upper arm  Site Condition No complications  Fistula / Graft Assessment Present;Thrill;Bruit  Status Accessed  Needle Size 15g  Drainage Description None

## 2019-06-25 NOTE — Progress Notes (Signed)
[facility], [place] 06/25/19  Subjective:   LOS: 2 11/16 0701 - 11/17 0700 In: -  Out: 2000   Last HD was on Friday, she is admitted for shortness of breath, orthopnea A day prior to admission she had chest pain and some burning sensation   She also reports chronically decreased appetite although no nausea or vomiting She has noticed some increasing cough, increase sputum production but no fever She uses continuous oxygen and CPAP at home  Still reports shortness of breath and edema in legs Dyspnea on minimal exertion  2000 cc removed with HD yesterday   Objective:  Vital signs in last 24 hours:  Temp:  [97.9 F (36.6 C)-98.7 F (37.1 C)] 98.4 F (36.9 C) (11/17 1507) Pulse Rate:  [53-75] 53 (11/17 1507) Resp:  [16-24] 16 (11/17 1507) BP: (86-120)/(42-70) 107/54 (11/17 1507) SpO2:  [96 %-100 %] 97 % (11/17 1507) FiO2 (%):  [98 %] 98 % (11/16 1836)  Weight change: -0.071 kg Filed Weights   06/23/19 1620 06/24/19 0945  Weight: 122.5 kg 122.4 kg    Intake/Output:    Intake/Output Summary (Last 24 hours) at 06/25/2019 1647 Last data filed at 06/25/2019 1300 Gross per 24 hour  Intake 240 ml  Output 0 ml  Net 240 ml     Physical Exam: General:  Obese lady, laying in the bed  HEENT  moist oral mucous membranes  Pulm/lungs  bibasilar crackles, [place] O2  CVS/Heart  irregular,    Abdomen:   Obese, soft, nontender  Extremities:  1-2+ pitting edema bilaterally  Neurologic:  Alert, oriented, able to follow commands  Skin:  No acute rashes  Access:  aVF       Basic Metabolic Panel:  Recent Labs  Lab 06/23/19 1627 06/24/19 0535 06/25/19 0506  NA 141 141 137  K 4.7 4.8 4.6  CL 99 100 99  CO2 26 25 27   GLUCOSE 92 85 112*  BUN 61* 70* 43*  CREATININE 8.43* 9.48* 6.68*  CALCIUM 7.6* 7.8* 7.7*     CBC: Recent Labs  Lab 06/23/19 1627 06/24/19 1120  WBC 7.5 6.5  NEUTROABS 5.0  --   HGB 8.4* 7.5*  HCT 26.7* 24.1*  MCV 94.0  96.8  PLT 244 227      Lab Results  Component Value Date   HEPBSAG Negative 01/19/2017   HEPBSAB Reactive 01/19/2017      Microbiology:  Recent Results (from the past 240 hour(s))  SARS CORONAVIRUS 2 (TAT 6-24 HRS) Nasopharyngeal Nasopharyngeal Swab     Status: None   Collection Time: 06/23/19  7:36 PM   Specimen: Nasopharyngeal Swab  Result Value Ref Range Status   SARS Coronavirus 2 NEGATIVE NEGATIVE Final    Comment: (NOTE) SARS-CoV-2 target nucleic acids are NOT DETECTED. The SARS-CoV-2 RNA is generally detectable in upper and lower respiratory specimens during the acute phase of infection. Negative results do not preclude SARS-CoV-2 infection, do not rule out co-infections with other pathogens, and should not be used as the sole basis for treatment or other patient management decisions. Negative results must be combined with clinical observations, patient history, and epidemiological information. The expected result is Negative. Fact Sheet for Patients: SugarRoll.be Fact Sheet for Healthcare Providers: https://www.woods-mathews.com/ This test is not yet approved or cleared by the [place] FDA and  has been authorized for detection and/or diagnosis of SARS-CoV-2 by FDA under an Emergency Use Authorization (EUA). This EUA will remain  in effect (meaning this test  can be used) for the duration of the COVID-19 declaration under Section 56 4(b)(1) of the Act, 21 U.S.C. section 360bbb-3(b)(1), unless the authorization is terminated or revoked sooner. Performed at [facility], [place] [address], [place], [place] [address]     Coagulation Studies: No results for input(s): LABPROT, INR in the last 72 hours.  Urinalysis: No results for input(s): COLORURINE, LABSPEC, PHURINE, GLUCOSEU, HGBUR, BILIRUBINUR, KETONESUR, PROTEINUR, UROBILINOGEN, NITRITE, LEUKOCYTESUR in the last 72 hours.  Invalid input(s): APPERANCEUR     Imaging: Dg Chest 1 View  Result Date: 06/23/2019 CLINICAL DATA:  Shortness of breath, cough, chest pain EXAM: CHEST  1 VIEW COMPARISON:  05/12/2019 FINDINGS: Low volume underpenetrated AP portable examination with similar elevation of the hemidiaphragms and diffuse bilateral interstitial opacity. No new or focal airspace opacity. Cardiomegaly. IMPRESSION: 1. Low volume underpenetrated AP portable examination with similar elevation of the hemidiaphragms and diffuse bilateral interstitial opacity. Findings are most consistent with edema. No new or focal airspace opacity. 2.  Cardiomegaly. Electronically Signed   By: Eddie Candle M.D.   On: 06/23/2019 18:45     Medications:   . sodium chloride     . sodium chloride   Intravenous Once  . amLODipine  10 mg Oral Daily  . apixaban  2.5 mg Oral BID  . aspirin EC  81 mg Oral Daily  . atorvastatin  80 mg Oral Daily  . carvedilol  25 mg Oral BID  . cinacalcet  60 mg Oral BID WC  . [START ON 06/26/2019] epoetin (EPOGEN/PROCRIT) injection  4,000 Units Intravenous Q M,W,F-HD  . furosemide  40 mg Intravenous Q12H  . gabapentin  100 mg Oral BID  . multivitamin  1 tablet Oral QHS  . sevelamer carbonate  1,600 mg Oral TID WC & HS  . sodium chloride flush  3 mL Intravenous Q12H   sodium chloride, acetaminophen, ALPRAZolam, ondansetron (ZOFRAN) IV, sodium chloride flush, zolpidem  Assessment/ Plan:  73 y.o. African-American female with end-stage renal disease on hemodialysis, rheumatoid arthritis, history of stroke, congestive heart failure, multiple myeloma, hypertension, hyperlipidemia, obstructive sleep apnea  MWF [facility] [address] /   Active Problems:   Acute pulmonary edema (HCC)   History of anemia due to chronic kidney disease   ESRD (end stage renal disease) ([place])   #. ESRD with volume overload and acute pulmonary edema Patient to be dialyzed on MWF schedule 2 L of fluid was removed with dialysis on Monday. We  will arrange for extra hemodialysis treatment today.  UF treatment only for volume removal 2 to 3 L as tolerated Blood pressure is low normal.  Will discontinue amlodipine to allow for higher blood pressures which will help with volume removal.  #. Anemia of CKD  Lab Results  Component Value Date   HGB 7.5 (L) 06/24/2019  Will schedule low dose EPO with HD (MWF)  #. SHPTH  No results found for: PTH Lab Results  Component Value Date   PHOS 8.8 (H) 05/13/2019   Monitor calcium and phos level during this admission Continue sevelamer.  Increase dose to 2 tablets with each meal  Cardiac: 2D echo June 24, 2019: LVEF 60 to 65%.  Mild aortic valve sclerosis.    LOS: [place] 11/17/20204:47 PM  [place], [place]

## 2019-06-25 NOTE — Progress Notes (Signed)
Ch visited with pt to complete AD education. Pt is a 73 y.o. female w/ a hx of cancer, CHF, kidney failure, PE, and Afib that has been hospitalized for SOB and fluid retention related to CHF. Pt shares that she has trouble sleeping due to having to wear a C-Pap at night. Ch provided words of comfort and space for the pt to express her grief related to how long she has been dealing with health challenges that became progressively worse after she was treated for cancer back in 2007. Pt has been on dialysis for ~13 years w/ little complications with he shunt. Pt shared that sometimes she get tired of going to dialysis some days but does not want to stop trt. Ch understood her concern but the pt was not wanting to do the at home option because of the possible risk of infection. Pt shared that she does not want to be a burden to her family but she has several family members that can support her with the exception of one child. Ch shared that the pt should name a person that she knows will honor her wishes for POC/GOC. Pt understood and shared that she would need more time to talk over the HPOA decision with her family. Ch provided prayer for pt and shared that f/u will occur at a later time.     06/25/19 1200  Clinical Encounter Type  Visited With Patient  Visit Type Spiritual support;Social support;Other (Comment) (AD education )  Referral From Nurse  Consult/Referral To Chaplain  Spiritual Encounters  Spiritual Needs Emotional;Grief support  Stress Factors  Patient Stress Factors Exhausted;Health changes;Major life changes;Loss of control  Family Stress Factors Family relationships  Advance Directives (For Healthcare)  Does Patient Have a Medical Advance Directive? No  Would patient like information on creating a medical advance directive? Yes (Inpatient - patient requests chaplain consult to create a medical advance directive)  [place] Directives  Does Patient Have a Mental Health  Advance Directive? No

## 2019-06-25 NOTE — TOC Initial Note (Signed)
Transition of Care (TOC) - Initial/Assessment Note    Patient Details  Name: Cassandra Joseph MRN: [id number] Date of Birth: 08/11/1945  Transition of Care (TOC) CM/SW Contact:    Sarah C Boswell, LCSW Phone Number: 06/25/2019, 3:10 PM  Clinical Narrative:  Readmission prevention screen complete. CSW met with patient. Nurse tech at bedside. CSW introduced role and explained that discharge planning would be discussed. Patient is home with her husband but he is unable to provide any assistance. She had [facility] (PT, OT, RN) prior to admission. She has a walker, wheelchair, cane, and oxygen at home. Her PCP is Dr. Hande and [facility] [place]. No issues obtaining medications. Patient was driving until about 2 months ago. Now her daughters and granddaughters drive her as needed. No further concerns. CSW encouraged patient to contact CSW as needed. CSW will continue to follow patient for support and facilitate return home when stable.   Expected Discharge Plan: Home w Home Health Services Barriers to Discharge: Continued Medical Work up   Patient Goals and CMS Choice     Choice offered to / list presented to : NA  Expected Discharge Plan and Services Expected Discharge Plan: Home w Home Health Services     Post Acute Care Choice: Resumption of Svcs/PTA Provider Living arrangements for the past 2 months: Single Family Home                           HH Arranged: RN, PT, OT HH Agency: [facility] Date HH Agency Contacted: 06/25/19   Representative spoke with at HH Agency: Jason [place]inton  Prior Living Arrangements/Services Living arrangements for the past 2 months: Single Family Home Lives with:: Spouse Patient language and need for interpreter reviewed:: Yes Do you feel safe going back to the place where you live?: Yes      Need for Family Participation in Patient Care: Yes (Comment) Care giver support system in  place?: Yes (comment) Current home services: DME, Home OT, Home PT, Home RN Criminal Activity/Legal Involvement Pertinent to Current Situation/Hospitalization: No - Comment as needed  Activities of Daily Living Home Assistive Devices/Equipment: Bedside commode/3-in-1, CPAP, Raised toilet seat with rails, Oxygen, Walker (specify type) ADL Screening (condition at time of admission) Patient's cognitive ability adequate to safely complete daily activities?: Yes Is the patient deaf or have difficulty hearing?: No Does the patient have difficulty seeing, even when wearing glasses/contacts?: No Does the patient have difficulty concentrating, remembering, or making decisions?: No Patient able to express need for assistance with ADLs?: Yes Does the patient have difficulty dressing or bathing?: Yes Independently performs ADLs?: No Communication: Independent Dressing (OT): Needs assistance Is this a change from baseline?: Pre-admission baseline Grooming: Needs assistance Is this a change from baseline?: Pre-admission baseline Feeding: Independent Bathing: Needs assistance Is this a change from baseline?: Pre-admission baseline Toileting: Needs assistance Is this a change from baseline?: Pre-admission baseline In/Out Bed: Needs assistance Is this a change from baseline?: Pre-admission baseline Walks in Home: Needs assistance Is this a change from baseline?: Pre-admission baseline Does the patient have difficulty walking or climbing stairs?: Yes Weakness of Legs: Both Weakness of Arms/Hands: Right  Permission Sought/Granted Permission sought to share information with : Facility Contact Representative Permission granted to share information with : Yes, Verbal Permission Granted     Permission granted to share info w AGENCY: [facility]          Emotional Assessment Appearance:: Appears stated age Attitude/Demeanor/Rapport: Engaged, Gracious Affect (typically observed): Accepting,  Appropriate, Calm, Pleasant Orientation: : Oriented to Self, Oriented to Place, Oriented to  Time, Oriented to Situation Alcohol / Substance Use: Never Used Psych Involvement: No (comment)  Admission diagnosis:  SOB (shortness of breath) [R06.02] Patient Active Problem List   Diagnosis Date Noted  . SOB (shortness of breath)   . Acute pulmonary edema (HCC) 06/23/2019  . Acute on chronic diastolic CHF (congestive heart failure) (HCC) 01/18/2017  . HLD (hyperlipidemia) 01/18/2017  . Atypical chest pain 11/09/2016  . ESRD on hemodialysis (HCC) 11/09/2016  . Morbid obesity (HCC) 11/09/2016  . Essential hypertension 11/09/2016  . Multiple myeloma (HCC) 11/09/2016  . Elevated troponin 11/09/2016  . Metabolic syndrome 11/09/2016   PCP:  Hande, Vishwanath, MD Pharmacy:   [id number] [place], [place] - [address] [place] [address] Phone: [phone] Fax: [phone]     Social Determinants of Health (SDOH) Interventions    Readmission Risk Interventions Readmission Risk Prevention Plan 06/25/2019 05/14/2019  Transportation Screening Complete Complete  PCP or Specialist Appt within 3-5 Days Complete Complete  HRI or Home Care Consult Complete Complete  Social Work Consult for Recovery Care Planning/Counseling Complete Complete  Palliative Care Screening Not Applicable Not Applicable  Medication Review (RN Care Manager) Complete Complete  Some recent data might be hidden    

## 2019-06-25 NOTE — Plan of Care (Addendum)
Nutrition Education Note  RD was consulted for nutrition education regarding CHF. Pt was given "Nutrition Therapy for People with Heart Failure" handout from the academy of [employer] Dietetics. Pt stated that she is on a diet for kidney disease and adheres to that diet "as best I can". Stated that her meals typically consist of vegetables and meat and that she is aware of trying to stay away from using the salt shaker. Was educated on a brief pathophysiology of CHF and why it is important to know how much sodium she is consuming. Pt was instructed to weigh herself daily, preferably at the same time each day and to notify her PCP of weight gain of 3lbs in one day or 5lbs in a week. Teach back method utilized. Expect good compliance. Body mass index is 43.55 kg/m. Patient meets criteria for obese class III based on current BMI Current diet order is 2 gram sodium. Pt. Is consuming approximately 80% of meals at this time. Labs and medications reviewed. No further nutrition interventions warranted at this time. RD contact information provided. If additional nutrition issues arise, please re-consult RD  Meda Klinefelter, Dietetic Intern

## 2019-06-26 LAB — BPAM RBC
Blood Product Expiration Date: 202012212359
ISSUE DATE / TIME: 202011171827
Unit Type and Rh: 5100

## 2019-06-26 LAB — CBC
HCT: 26.6 % — ABNORMAL LOW (ref 36.0–46.0)
Hemoglobin: 8.5 g/dL — ABNORMAL LOW (ref 12.0–15.0)
MCH: 29.8 pg (ref 26.0–34.0)
MCHC: 32 g/dL (ref 30.0–36.0)
MCV: 93.3 fL (ref 80.0–100.0)
Platelets: 244 10*3/uL (ref 150–400)
RBC: 2.85 MIL/uL — ABNORMAL LOW (ref 3.87–5.11)
RDW: 16.7 % — ABNORMAL HIGH (ref 11.5–15.5)
WBC: 7.2 10*3/uL (ref 4.0–10.5)
nRBC: 0 % (ref 0.0–0.2)

## 2019-06-26 LAB — BASIC METABOLIC PANEL
Anion gap: 18 — ABNORMAL HIGH (ref 5–15)
BUN: 55 mg/dL — ABNORMAL HIGH (ref 8–23)
CO2: 26 mmol/L (ref 22–32)
Calcium: 7.8 mg/dL — ABNORMAL LOW (ref 8.9–10.3)
Chloride: 94 mmol/L — ABNORMAL LOW (ref 98–111)
Creatinine, Ser: 7.74 mg/dL — ABNORMAL HIGH (ref 0.44–1.00)
GFR calc Af Amer: 5 mL/min — ABNORMAL LOW (ref >60)
GFR calc non Af Amer: 5 mL/min — ABNORMAL LOW (ref >60)
Glucose, Bld: 87 mg/dL (ref 70–99)
Potassium: 4.5 mmol/L (ref 3.5–5.1)
Sodium: 138 mmol/L (ref 135–145)

## 2019-06-26 LAB — TYPE AND SCREEN
ABO/RH(D): O POS
Antibody Screen: NEGATIVE
Unit division: 0

## 2019-06-26 MED ORDER — TORSEMIDE 20 MG PO TABS
60.0000 mg | ORAL_TABLET | Freq: Every day | ORAL | Status: DC
  Administered 2019-06-27: 60 mg via ORAL
  Filled 2019-06-26: qty 3

## 2019-06-26 NOTE — Progress Notes (Signed)
Post HD Assessment    06/26/19 1245  Neurological  Orientation Level Oriented X4  Respiratory  Respiratory Pattern Regular;Unlabored  Chest Assessment Chest expansion symmetrical  Bilateral Breath Sounds Clear;Diminished  Cough None  Cardiac  Pulse Irregular  ECG Monitor Yes  Cardiac Rhythm Atrial fibrillation  Vascular  R Radial Pulse +2  L Radial Pulse +2  Edema Generalized  Integumentary  Integumentary (WDL) WDL  Psychosocial  Psychosocial (WDL) WDL

## 2019-06-26 NOTE — Progress Notes (Signed)
[facility] NAME: Cassandra Joseph    MR#:  [id number]  DATE OF BIRTH:  1945/12/04  SUBJECTIVE:  CHIEF COMPLAINT:   Chief Complaint  Patient presents with  . Shortness of Breath  seen at dialysis, tired, cough, sob REVIEW OF SYSTEMS:  Review of Systems  Constitutional: Negative for diaphoresis, fever, malaise/fatigue and weight loss.  HENT: Negative for ear discharge, ear pain, hearing loss, nosebleeds, sore throat and tinnitus.   Eyes: Negative for blurred vision and pain.  Respiratory: Positive for shortness of breath. Negative for cough, hemoptysis and wheezing.   Cardiovascular: Negative for chest pain, palpitations, orthopnea and leg swelling.  Gastrointestinal: Negative for abdominal pain, blood in stool, constipation, diarrhea, heartburn, nausea and vomiting.  Genitourinary: Negative for dysuria, frequency and urgency.  Musculoskeletal: Negative for back pain and myalgias.  Skin: Negative for itching and rash.  Neurological: Negative for dizziness, tingling, tremors, focal weakness, seizures, weakness and headaches.  Psychiatric/Behavioral: Negative for depression. The patient is not nervous/anxious.    DRUG ALLERGIES:   Allergies  Allergen Reactions  . Shellfish Allergy Shortness Of Breath   VITALS:  Blood pressure (!) 121/59, pulse 73, temperature 99.1 F (37.3 C), temperature source Oral, resp. rate 20, height 5\' 6"  (1.676 m), weight 122.5 kg, SpO2 99 %. PHYSICAL EXAMINATION:  Physical Exam HENT:     Head: Normocephalic and atraumatic.  Eyes:     Conjunctiva/sclera: Conjunctivae normal.     Pupils: Pupils are equal, round, and reactive to light.  Neck:     Musculoskeletal: Normal range of motion and neck supple.     Thyroid: No thyromegaly.     Trachea: No tracheal deviation.  Cardiovascular:     Rate and Rhythm: Normal rate and regular rhythm.     Heart sounds: Normal heart sounds.  Pulmonary:     Effort: Pulmonary effort  is normal. No respiratory distress.     Breath sounds: Normal breath sounds. No wheezing.  Chest:     Chest wall: No tenderness.  Abdominal:     General: Bowel sounds are normal. There is no distension.     Palpations: Abdomen is soft.     Tenderness: There is no abdominal tenderness.  Musculoskeletal: Normal range of motion.  Skin:    General: Skin is warm and dry.     Findings: No rash.  Neurological:     Mental Status: She is alert and oriented to person, place, and time.     Cranial Nerves: No cranial nerve deficit.    LABORATORY PANEL:  Female CBC Recent Labs  Lab 06/26/19 0453  WBC 7.2  HGB 8.5*  HCT 26.6*  PLT 244   ------------------------------------------------------------------------------------------------------------------ Chemistries  Recent Labs  Lab 06/23/19 1627  06/26/19 0453  NA 141   < > 138  K 4.7   < > 4.5  CL 99   < > 94*  CO2 26   < > 26  GLUCOSE 92   < > 87  BUN 61*   < > 55*  CREATININE 8.43*   < > 7.74*  CALCIUM 7.6*   < > 7.8*  AST 22  --   --   ALT 11  --   --   ALKPHOS 264*  --   --   BILITOT 1.0  --   --    < > = values in this interval not displayed.   RADIOLOGY:  No results found. ASSESSMENT AND PLAN:   1.  Acute on chronic diastolic congestive heart failure.   - 2D echo in 2018 revealing preserved EF with grade 2 diastolic dysfunction.  She has documented moderate to severe mitral stenosis  - Appreciate Dr. Ubaldo Glassing input.   - continue with carvedilol at 25 mg twice daily, change lasix to torsemide 60 mg once daily - low-sodium diet. Repeat echo shows similar finding as before  2.  End-stage renal disease on hemodialysis on Monday Wednesday and Friday with fluid overload.   - HD per nephro - neg 7.2 Liters this admission so far - Nephro discontinued amlodipine to allow for higher blood pressures which will help with volume removal at HD. - also changed iv lasix to oral torsemide  3.  Atrial fibrillation with controlled  ventricular response with associated chest pain - new onset - Cardio started Eliquis at 2.5 mg given end-stage renal disease..  - continue Coreg at current dose as her rate is well controlled.  4.  Right medial thigh swelling likely subcutaneous lipomas.  This can be followed up by her primary care physician on an outpatient basis.  No apparent signs of cellulitis.  5.  Obstructive sleep apnea. continue her CPAP nightly.  That should help with #1.  6.  Hypertension.  continue her Norvasc and Coreg.  7.  Peripheral neuropathy.  continue Neurontin.  8.  Dyslipidemia.  continue statin therapy.  9.  Anemia of chronic kidney disease - hemoglobin down to 7.5 (was 9.9 about a month ago) -s/p 1 packed red blood cell transfusion - Hb 8.5   DVT prophylaxis.   on Eliquis.     All the records are reviewed and case discussed with Care Management/Social Worker. Management plans discussed with the patient, nursing and they are in agreement.  CODE STATUS: Full Code  TOTAL TIME TAKING CARE OF THIS PATIENT: 35 minutes.   More than 50% of the time was spent in counseling/coordination of care: YES  POSSIBLE D/C IN 1-2 DAYS, DEPENDING ON CLINICAL CONDITION.   Max Sane M.D on 06/26/2019 at 10:05 PM  Between 7am to 6pm - Pager - [phone]  After 6pm go to [email or web address] - password [email or web address]  [facility]   CC: Primary care physician; Tracie Harrier, MD  Note: This dictation was prepared with Dragon dictation along with smaller phrase technology. Any transcriptional errors that result from this process are unintentional.

## 2019-06-26 NOTE — Progress Notes (Signed)
Pre HD Tx   06/26/19 0930  Hand-Off documentation  Report given to (Full Name) Beatris Ship, RN   Report received from (Full Name) Particia Nearing, RN   Vital Signs  Temp 98.5 F (36.9 C)  Temp Source Oral  Pulse Rate 68  Pulse Rate Source Monitor  Resp 19  BP (!) 105/44  BP Location Left Arm  BP Method Automatic  Patient Position (if appropriate) Sitting  Oxygen Therapy  SpO2 100 %  O2 Device Nasal Cannula  Pulse Oximetry Type Continuous  Pain Assessment  Pain Scale 0-10  Pain Score 0  Dialysis Weight  Weight 124.5 kg  Type of Weight Pre-Dialysis  Time-Out for Hemodialysis  What Procedure? HD  Pt Identifiers(min of two) First/Last Name;MRN/Account#  Correct Site? Yes  Correct Side? Yes  Correct Procedure? Yes  Consents Verified? Yes  Rad Studies Available? N/A  Safety Precautions Reviewed? Yes  Engineer, civil (consulting) Number 6  Station Number 3  UF/Alarm Test Passed  Conductivity: Meter 14  Conductivity: Machine  14.1  pH 7.2  Reverse Osmosis Main  Normal Saline Lot Number [id number]  Dialyzer Lot Number [id number]  Disposable Set Lot Number [id number]  Machine Temperature 98.6 F (37 C)  Musician and Audible Yes  Blood Lines Intact and Secured Yes  Pre Treatment Patient Checks  Vascular access used during treatment Fistula  Patient is receiving dialysis in a chair Yes  Hepatitis B Surface Antigen Results Negative  Date Hepatitis B Surface Antigen Drawn 06/12/19  Hepatitis B Surface Antibody 9  Date Hepatitis B Surface Antibody Drawn 05/20/19  Hemodialysis Consent Verified Yes  Hemodialysis Standing Orders Initiated Yes  ECG (Telemetry) Monitor On Yes  Prime Ordered Normal Saline  Length of  DialysisTreatment -hour(s) 3 Hour(s)  Dialysis Treatment Comments Try Dialysis in chair   Dialyzer Elisio 17H NR  Dialysate 3K;2.5 Ca  Dialysis Anticoagulant None  Dialysate Flow Ordered 800  Blood Flow Rate Ordered 400 mL/min  Ultrafiltration Goal 1.5 Liters   Dialysis Blood Pressure Support Ordered Normal Saline  Education / Care Plan  Dialysis Education Provided Yes  Documented Education in Care Plan Yes  Fistula / Graft Right Upper arm  No Placement Date or Time found.   Placed prior to admission: Yes  Orientation: Right  Access Location: Upper arm  Site Condition No complications  Fistula / Graft Assessment Present;Thrill;Bruit  Status Patent  Drainage Description None

## 2019-06-26 NOTE — Care Management Important Message (Signed)
Important Message  Patient Details  Name: Cassandra Joseph MRN: [id number] Date of Birth: 08-01-1946   Medicare Important Message Given:  Yes     Dannette Barbara 06/26/2019, 10:41 AM

## 2019-06-26 NOTE — Progress Notes (Signed)
To Dialysis via chair 

## 2019-06-26 NOTE — Progress Notes (Signed)
Pre HD Assessment    06/26/19 0930  Neurological  Level of Consciousness Alert  Orientation Level Oriented X4  Respiratory  Respiratory Pattern Regular;Unlabored  Chest Assessment Chest expansion symmetrical  Bilateral Breath Sounds Clear;Diminished  Cough None  Cardiac  Pulse Irregular  ECG Monitor Yes  Cardiac Rhythm Atrial fibrillation  Vascular  R Radial Pulse +2  L Radial Pulse +2  Edema Generalized  Integumentary  Integumentary (WDL) WDL  Psychosocial  Psychosocial (WDL) WDL

## 2019-06-26 NOTE — Progress Notes (Signed)
HD Tx completed    06/26/19 1230  Vital Signs  Pulse Rate 78  Pulse Rate Source Monitor  Resp 19  BP (!) 131/115  BP Location Left Arm  BP Method Automatic  Patient Position (if appropriate) Sitting  Oxygen Therapy  SpO2 100 %  O2 Device Nasal Cannula  During Hemodialysis Assessment  HD Safety Checks Performed Yes  KECN 0 KECN  Dialysis Fluid Bolus Normal Saline  Bolus Amount (mL) 250 mL  Intra-Hemodialysis Comments Tx completed;Tolerated well

## 2019-06-26 NOTE — Progress Notes (Signed)
HD Tx started w/o complication, pt is sitting in dialysis   06/26/19 0935  Vital Signs  Pulse Rate 68  BP 108/64  BP Location Left Arm  BP Method Automatic  Patient Position (if appropriate) Sitting  Oxygen Therapy  SpO2 100 %  O2 Device Nasal Cannula  During Hemodialysis Assessment  Blood Flow Rate (mL/min) 400 mL/min  Arterial Pressure (mmHg) -180 mmHg  Venous Pressure (mmHg) 170 mmHg  Transmembrane Pressure (mmHg) 80 mmHg  Ultrafiltration Rate (mL/min) 840 mL/min  Dialysate Flow Rate (mL/min) 800 ml/min  Conductivity: Machine  14  HD Safety Checks Performed Yes  Dialysis Fluid Bolus Normal Saline  Bolus Amount (mL) 250 mL  Intra-Hemodialysis Comments Tx initiated  Fistula / Graft Right Upper arm  No Placement Date or Time found.   Placed prior to admission: Yes  Orientation: Right  Access Location: Upper arm  Status Accessed  Needle Size 15g   Drainage Description None   recliner comfortable

## 2019-06-26 NOTE — Progress Notes (Signed)
Post HD Tx    06/26/19 1245  Hand-Off documentation  Report given to (Full Name) Particia Nearing, RN   Report received from (Full Name) Beatris Ship, RN   Vital Signs  Temp 98.8 F (37.1 C)  Temp Source Oral  Pulse Rate 82  Pulse Rate Source Monitor  Resp (!) 25  BP (!) 107/58  BP Location Left Arm  BP Method Automatic  Patient Position (if appropriate) Sitting  Oxygen Therapy  SpO2 100 %  O2 Device Nasal Cannula  O2 Flow Rate (L/min) 4 L/min  Pain Assessment  Pain Scale 0-10  Pain Score 0  Dialysis Weight  Weight 122.5 kg  Type of Weight Post-Dialysis  Post-Hemodialysis Assessment  Rinseback Volume (mL) 250 mL  KECN 67.8 V  Dialyzer Clearance Lightly streaked  Duration of HD Treatment -hour(s) 3 hour(s)  Hemodialysis Intake (mL) 500 mL  UF Total -Machine (mL) 2504 mL  Net UF (mL) 2004 mL  Tolerated HD Treatment Yes  AVG/AVF Arterial Site Held (minutes) 10 minutes  AVG/AVF Venous Site Held (minutes) 5 minutes  Fistula / Graft Right Upper arm  No Placement Date or Time found.   Placed prior to admission: Yes  Orientation: Right  Access Location: Upper arm  Site Condition No complications  Fistula / Graft Assessment Present;Thrill;Bruit  Drainage Description None

## 2019-06-26 NOTE — Progress Notes (Signed)
[facility], [place] 06/26/19  Subjective:   LOS: 3 11/17 0701 - 11/18 0700 In: 240 [P.O.:240] Out: 3504   Last HD was on Friday, she is admitted for shortness of breath, orthopnea A day prior to admission she had chest pain and some burning sensation   She also reports chronically decreased appetite although no nausea or vomiting She has noticed some increasing cough, increase sputum production but no fever She uses continuous oxygen and CPAP at home  Feeling better Seen during HD   HEMODIALYSIS FLOWSHEET:  Blood Flow Rate (mL/min): 400 mL/min Arterial Pressure (mmHg): -180 mmHg Venous Pressure (mmHg): 170 mmHg Transmembrane Pressure (mmHg): 80 mmHg Ultrafiltration Rate (mL/min): 840 mL/min Dialysate Flow Rate (mL/min): 800 ml/min Conductivity: Machine : 14 Conductivity: Machine : 14 Dialysis Fluid Bolus: Normal Saline Bolus Amount (mL): 250 mL     Objective:  Vital signs in last 24 hours:  Temp:  [98.3 F (36.8 C)-99.2 F (37.3 C)] 98.3 F (36.8 C) (11/18 1621) Pulse Rate:  [65-82] 73 (11/18 1621) Resp:  [13-27] 15 (11/18 1346) BP: (79-146)/(44-115) 111/53 (11/18 1621) SpO2:  [97 %-100 %] 97 % (11/18 1621) Weight:  [120 kg-124.5 kg] 122.5 kg (11/18 1245)  Weight change: 1.1 kg Filed Weights   06/26/19 0354 06/26/19 0930 06/26/19 1245  Weight: 124.5 kg 124.5 kg 122.5 kg    Intake/Output:    Intake/Output Summary (Last 24 hours) at 06/26/2019 1755 Last data filed at 06/26/2019 1346 Gross per 24 hour  Intake 3 ml  Output 5508 ml  Net -5505 ml     Physical Exam: General:  Obese lady, laying in the bed  HEENT  moist oral mucous membranes  Pulm/lungs Mild  bibasilar crackles, [place] O2  CVS/Heart  irregular,    Abdomen:   Obese, soft, nontender  Extremities:  1-2+ pitting edema bilaterally  Neurologic:  Alert, oriented, able to follow commands  Skin:  No acute rashes  Access:  aVF       Basic Metabolic Panel:  Recent  Labs  Lab 06/23/19 1627 06/24/19 0535 06/25/19 0506 06/26/19 0453  NA 141 141 137 138  K 4.7 4.8 4.6 4.5  CL 99 100 99 94*  CO2 26 25 27 26   GLUCOSE 92 85 112* 87  BUN 61* 70* 43* 55*  CREATININE 8.43* 9.48* 6.68* 7.74*  CALCIUM 7.6* 7.8* 7.7* 7.8*     CBC: Recent Labs  Lab 06/23/19 1627 06/24/19 1120 06/26/19 0453  WBC 7.5 6.5 7.2  NEUTROABS 5.0  --   --   HGB 8.4* 7.5* 8.5*  HCT 26.7* 24.1* 26.6*  MCV 94.0 96.8 93.3  PLT 244 227 244      Lab Results  Component Value Date   HEPBSAG Negative 01/19/2017   HEPBSAB Reactive 01/19/2017      Microbiology:  Recent Results (from the past 240 hour(s))  SARS CORONAVIRUS 2 (TAT 6-24 HRS) Nasopharyngeal Nasopharyngeal Swab     Status: None   Collection Time: 06/23/19  7:36 PM   Specimen: Nasopharyngeal Swab  Result Value Ref Range Status   SARS Coronavirus 2 NEGATIVE NEGATIVE Final    Comment: (NOTE) SARS-CoV-2 target nucleic acids are NOT DETECTED. The SARS-CoV-2 RNA is generally detectable in upper and lower respiratory specimens during the acute phase of infection. Negative results do not preclude SARS-CoV-2 infection, do not rule out co-infections with other pathogens, and should not be used as the sole basis for treatment or other patient management decisions. Negative results must be  combined with clinical observations, patient history, and epidemiological information. The expected result is Negative. Fact Sheet for Patients: SugarRoll.be Fact Sheet for Healthcare Providers: https://www.woods-mathews.com/ This test is not yet approved or cleared by the [place] FDA and  has been authorized for detection and/or diagnosis of SARS-CoV-2 by FDA under an Emergency Use Authorization (EUA). This EUA will remain  in effect (meaning this test can be used) for the duration of the COVID-19 declaration under Section 56 4(b)(1) of the Act, 21 U.S.C. section 360bbb-3(b)(1),  unless the authorization is terminated or revoked sooner. Performed at [facility], [place] [address] [place], [place] [address]   MRSA PCR Screening     Status: None   Collection Time: 06/25/19  3:26 PM   Specimen: Nasopharyngeal  Result Value Ref Range Status   MRSA by PCR NEGATIVE NEGATIVE Final    Comment:        The GeneXpert MRSA Assay (FDA approved for NASAL specimens only), is one component of a comprehensive MRSA colonization surveillance program. It is not intended to diagnose MRSA infection nor to guide or monitor treatment for MRSA infections. Performed at [facility], [address] [place],  [address]     Coagulation Studies: No results for input(s): LABPROT, INR in the last 72 hours.  Urinalysis: No results for input(s): COLORURINE, LABSPEC, PHURINE, GLUCOSEU, HGBUR, BILIRUBINUR, KETONESUR, PROTEINUR, UROBILINOGEN, NITRITE, LEUKOCYTESUR in the last 72 hours.  Invalid input(s): APPERANCEUR    Imaging: No results found.   Medications:   . sodium chloride     . sodium chloride   Intravenous Once  . apixaban  2.5 mg Oral BID  . aspirin EC  81 mg Oral Daily  . atorvastatin  80 mg Oral Daily  . carvedilol  25 mg Oral BID  . cinacalcet  60 mg Oral BID WC  . epoetin (EPOGEN/PROCRIT) injection  4,000 Units Intravenous Q M,W,F-HD  . gabapentin  100 mg Oral BID  . multivitamin  1 tablet Oral QHS  . sevelamer carbonate  1,600 mg Oral TID WC & HS  . sodium chloride flush  3 mL Intravenous Q12H  . [START ON 06/27/2019] torsemide  60 mg Oral Daily   sodium chloride, acetaminophen, ALPRAZolam, ondansetron (ZOFRAN) IV, sodium chloride flush, zolpidem  Assessment/ Plan:  73 y.o. African-American female with end-stage renal disease on hemodialysis, rheumatoid arthritis, history of stroke, congestive heart failure, multiple myeloma, hypertension, hyperlipidemia, obstructive sleep apnea  MWF [facility] [address] /   Active  Problems:   Acute pulmonary edema (HCC)   History of anemia due to chronic kidney disease   ESRD (end stage renal disease) ([place])   #. ESRD with volume overload and acute pulmonary edema Patient to be dialyzed on MWF schedule neg 7 Liters this admission so far Blood pressure is low normal.  Will discontinue amlodipine to allow for higher blood pressures which will help with volume removal. Change iv lasix to oral torsemide  #. Anemia of CKD  Lab Results  Component Value Date   HGB 8.5 (L) 06/26/2019  Will schedule low dose EPO with HD (MWF)  #. SHPTH  No results found for: PTH Lab Results  Component Value Date   PHOS 8.8 (H) 05/13/2019   Monitor calcium and phos level during this admission Continue sevelamer.    Cardiac: 2D echo June 24, 2019: LVEF 60 to 65%.  Mild aortic valve sclerosis.    LOS: 3 Olson Lucarelli 11/18/20205:55 PM  [facility]  [place], [place]

## 2019-06-26 NOTE — Progress Notes (Signed)
Take over care of patient, This RN does agree with previous RN assessment. Introduced myself to patient and she is not in any distress.

## 2019-06-26 NOTE — Plan of Care (Signed)
  Problem: Education: Goal: Knowledge of disease and its progression will improve Outcome: Progressing Goal: Individualized Educational Video(s) Outcome: Progressing   Problem: Health Behavior/Discharge Planning: Goal: Ability to manage health-related needs will improve Outcome: Progressing   Problem: Nutritional: Goal: Ability to make healthy dietary choices will improve Outcome: Progressing   Problem: Clinical Measurements: Goal: Complications related to the disease process, condition or treatment will be avoided or minimized Outcome: Progressing   Problem: Education: Goal: Knowledge of General Education information will improve Description: Including pain rating scale, medication(s)/side effects and non-pharmacologic comfort measures Outcome: Progressing   Problem: Health Behavior/Discharge Planning: Goal: Ability to manage health-related needs will improve Outcome: Progressing   Problem: Clinical Measurements: Goal: Ability to maintain clinical measurements within normal limits will improve Outcome: Progressing Goal: Will remain free from infection Outcome: Progressing Goal: Respiratory complications will improve Outcome: Progressing Goal: Cardiovascular complication will be avoided Outcome: Progressing   Problem: Activity: Goal: Risk for activity intolerance will decrease Outcome: Progressing   Problem: Nutrition: Goal: Adequate nutrition will be maintained Outcome: Progressing   Problem: Coping: Goal: Level of anxiety will decrease Outcome: Progressing   Problem: Elimination: Goal: Will not experience complications related to bowel motility Outcome: Progressing Goal: Will not experience complications related to urinary retention Outcome: Progressing   Problem: Pain Managment: Goal: General experience of comfort will improve Outcome: Progressing   Problem: Safety: Goal: Ability to remain free from injury will improve Outcome: Progressing   Problem:  Skin Integrity: Goal: Risk for impaired skin integrity will decrease Outcome: Progressing   Problem: Fluid Volume: Goal: Compliance with measures to maintain balanced fluid volume will improve Outcome: Not Progressing Note: Day 3 of dialysis today   Problem: Clinical Measurements: Goal: Diagnostic test results will improve Outcome: Not Progressing Note: BUN 55/7.74 this morning

## 2019-06-27 LAB — CBC
HCT: 28.3 % — ABNORMAL LOW (ref 36.0–46.0)
Hemoglobin: 9.1 g/dL — ABNORMAL LOW (ref 12.0–15.0)
MCH: 30.6 pg (ref 26.0–34.0)
MCHC: 32.2 g/dL (ref 30.0–36.0)
MCV: 95.3 fL (ref 80.0–100.0)
Platelets: 230 10*3/uL (ref 150–400)
RBC: 2.97 MIL/uL — ABNORMAL LOW (ref 3.87–5.11)
RDW: 16.4 % — ABNORMAL HIGH (ref 11.5–15.5)
WBC: 6.7 10*3/uL (ref 4.0–10.5)
nRBC: 0 % (ref 0.0–0.2)

## 2019-06-27 LAB — BASIC METABOLIC PANEL
Anion gap: 11 (ref 5–15)
BUN: 35 mg/dL — ABNORMAL HIGH (ref 8–23)
CO2: 27 mmol/L (ref 22–32)
Calcium: 8.1 mg/dL — ABNORMAL LOW (ref 8.9–10.3)
Chloride: 98 mmol/L (ref 98–111)
Creatinine, Ser: 6.05 mg/dL — ABNORMAL HIGH (ref 0.44–1.00)
GFR calc Af Amer: 7 mL/min — ABNORMAL LOW (ref >60)
GFR calc non Af Amer: 6 mL/min — ABNORMAL LOW (ref >60)
Glucose, Bld: 83 mg/dL (ref 70–99)
Potassium: 4.8 mmol/L (ref 3.5–5.1)
Sodium: 136 mmol/L (ref 135–145)

## 2019-06-27 MED ORDER — CARVEDILOL 25 MG PO TABS: 25.0000 mg | ORAL_TABLET | Freq: Every evening | ORAL | Status: DC

## 2019-06-27 MED ORDER — SIMETHICONE 80 MG PO CHEW
80.0000 mg | CHEWABLE_TABLET | Freq: Once | ORAL | Status: AC
  Administered 2019-06-27: 80 mg via ORAL
  Filled 2019-06-27: qty 1

## 2019-06-27 NOTE — Plan of Care (Signed)
  Problem: Fluid Volume: Goal: Compliance with measures to maintain balanced fluid volume will improve Outcome: Progressing   Problem: Nutritional: Goal: Ability to make healthy dietary choices will improve Outcome: Progressing   Problem: Clinical Measurements: Goal: Complications related to the disease process, condition or treatment will be avoided or minimized Outcome: Progressing

## 2019-06-27 NOTE — Progress Notes (Signed)
[facility] Progress Note  Patient: Cassandra Joseph VXB:[id number]   PCP: Tracie Harrier, MD DOB: 26-Nov-1945   DOA: 06/23/2019   DOS: 06/27/2019   Date of Service: the patient was seen and examined on 06/27/2019  Chief Complaint  Patient presents with  . Shortness of Breath     Brief hospital course: Pt. with PMH of ESRD on HD, OSA, CVA, multiple myeloma; presented with complain of shortness of breath, was found to have acute on chronic diastolic CHF.  Currently further plan is continue volume removal with HD.  Subjective: Continues to report shortness of breath continues to report fatigue and palpitation.  No nausea no vomiting.  No chest pain or chest tightness.  Assessment and Plan: Scheduled Meds: . sodium chloride   Intravenous Once  . apixaban  2.5 mg Oral BID  . aspirin EC  81 mg Oral Daily  . atorvastatin  80 mg Oral Daily  . carvedilol  25 mg Oral QPM  . cinacalcet  60 mg Oral BID WC  . epoetin (EPOGEN/PROCRIT) injection  4,000 Units Intravenous Q M,W,F-HD  . gabapentin  100 mg Oral BID  . multivitamin  1 tablet Oral QHS  . sevelamer carbonate  1,600 mg Oral TID WC & HS  . sodium chloride flush  3 mL Intravenous Q12H  . torsemide  60 mg Oral Daily   Continuous Infusions: . sodium chloride     PRN Meds: sodium chloride, acetaminophen, ALPRAZolam, ondansetron (ZOFRAN) IV, sodium chloride flush, zolpidem  1. Acute on chronic diastolic congestive heart failure.  - 2D echo in 2018 revealing preserved EF with grade 2 diastolic dysfunction. She has documented moderate to severe mitral stenosis  - Appreciate Dr. Ubaldo Glassing input.  - continue with carvedilol at 25 mg twice daily, change lasix to torsemide 60 mg once daily - low-sodium diet. Repeat echo shows similar finding as before  2. End-stage renal disease on hemodialysis on Monday Wednesday and Friday with fluid overload.  - HD per nephro - neg 7.2 Liters this admission so far - Nephro discontinued  amlodipine to allow for higher blood pressures which will help with volume removal at HD. - also changed iv lasix to oral torsemide  3. Atrial fibrillation with controlled ventricular responsewith associated chest pain - new onset - Cardio started Eliquis at 2.5 mg given end-stage renal disease..  - continue Coregat current dose as her rate is well controlled.  4.Right medial thigh swelling likely subcutaneous lipomas. This can be followed up by her primary care physician on an outpatient basis. No apparent signs of cellulitis.  5.Obstructive sleep apnea. continue her CPAP nightly. That should help with #1.  6.Hypertension. continue her Norvasc and Coreg.  7. Peripheral neuropathy. continue Neurontin.  8. Dyslipidemia. continue statin therapy.  9.  Anemia of chronic kidney disease - hemoglobin down to 7.5 (was 9.9 about a month ago) -s/p 1 packed red blood cell transfusion - Hb 8.5  10 morbid obesity Body mass index is 43.87 kg/m.  Dietary consultation outpatient.  Diet: Renal diet  DVT Prophylaxis: Therapeutic Anticoagulation with Eliquis   Advance goals of care discussion: Full code  Family Communication: no family was present at bedside, at the time of interview.   Disposition:  Discharge to home.  Consultants: Nephrology, cardiology Procedures: Echocardiogram   Antibiotics: Anti-infectives (From admission, onward)   None       Objective: Physical Exam: Vitals:   06/26/19 1954 06/27/19 0339 06/27/19 0733 06/27/19 1520  BP: (!) 121/59 (!) 103/41 Marland Kitchen)  115/59 121/69  Pulse: 73 (!) 55 67 72  Resp: 20 20 18 18   Temp: 99.1 F (37.3 C) 98.4 F (36.9 C) 98.6 F (37 C) 98.4 F (36.9 C)  TempSrc: Oral Oral Oral Oral  SpO2: 99% 99% 96% 97%  Weight:  123.3 kg    Height:        Intake/Output Summary (Last 24 hours) at 06/27/2019 1853 Last data filed at 06/26/2019 2306 Gross per 24 hour  Intake -  Output 0 ml  Net 0 ml   Filed Weights    06/26/19 0930 06/26/19 1245 06/27/19 0339  Weight: 124.5 kg 122.5 kg 123.3 kg   General: alert and oriented to time, place, and person. Appear in mild distress, affect appropriate Eyes: PERRL, Conjunctiva normal ENT: Oral Mucosa Clear, moist  Neck: no JVD, no Abnormal Mass Or lumps Cardiovascular: S1 and S2 Present, no Murmur,  Respiratory: good respiratory effort, Bilateral Air entry equal and Decreased, no signs of accessory muscle use, faint basal crackles, no wheezes Abdomen: Bowel Sound present, Soft and no tenderness, no hernia Skin: no rashes  Extremities: no Pedal edema, no calf tenderness Neurologic: without any new focal findings Gait not checked due to patient safety concerns  Data Reviewed: I have personally reviewed and interpreted daily labs, tele strips, imagings as discussed above. I reviewed all nursing notes, pharmacy notes, vitals, pertinent old records I have discussed plan of care as described above with RN and patient/family.  CBC: Recent Labs  Lab 06/23/19 1627 06/24/19 1120 06/26/19 0453 06/27/19 0621  WBC 7.5 6.5 7.2 6.7  NEUTROABS 5.0  --   --   --   HGB 8.4* 7.5* 8.5* 9.1*  HCT 26.7* 24.1* 26.6* 28.3*  MCV 94.0 96.8 93.3 95.3  PLT 244 227 244 830   Basic Metabolic Panel: Recent Labs  Lab 06/23/19 1627 06/24/19 0535 06/25/19 0506 06/26/19 0453 06/27/19 0621  NA 141 141 137 138 136  K 4.7 4.8 4.6 4.5 4.8  CL 99 100 99 94* 98  CO2 26 25 27 26 27   GLUCOSE 92 85 112* 87 83  BUN 61* 70* 43* 55* 35*  CREATININE 8.43* 9.48* 6.68* 7.74* 6.05*  CALCIUM 7.6* 7.8* 7.7* 7.8* 8.1*    Liver Function Tests: Recent Labs  Lab 06/23/19 1627  AST 22  ALT 11  ALKPHOS 264*  BILITOT 1.0  PROT 7.3  ALBUMIN 2.9*   No results for input(s): LIPASE, AMYLASE in the last 168 hours. No results for input(s): AMMONIA in the last 168 hours. Coagulation Profile: No results for input(s): INR, PROTIME in the last 168 hours. Cardiac Enzymes: No results for  input(s): CKTOTAL, CKMB, CKMBINDEX, TROPONINI in the last 168 hours. BNP (last 3 results) No results for input(s): PROBNP in the last 8760 hours. CBG: No results for input(s): GLUCAP in the last 168 hours. Studies: No results found.   Time spent: 35 minutes  Author: Berle Mull, MD [facility] 06/27/2019 6:53 PM  To reach On-call, see care teams to locate the attending and reach out to them via [email or web address]. If 7PM-7AM, please contact night-coverage If you still have difficulty reaching the attending provider, please page the [facility] (Director on Call) for [facility] on amion for assistance.

## 2019-06-27 NOTE — Progress Notes (Signed)
[facility], [place] 06/27/19  Subjective:   LOS: 4 11/18 0701 - 11/19 0700 In: 3 [I.V.:3] Out: 2004   Last HD was on Friday, she is admitted for shortness of breath, orthopnea A day prior to admission she had chest pain and some burning sensation   She also reports chronically decreased appetite although no nausea or vomiting She has noticed some increasing cough, increase sputum production but no fever She uses continuous oxygen and CPAP at home   Still not back to baseline Reports DOE    Objective:  Vital signs in last 24 hours:  Temp:  [98.4 F (36.9 C)-99.1 F (37.3 C)] 98.4 F (36.9 C) (11/19 1520) Pulse Rate:  [55-73] 72 (11/19 1520) Resp:  [18-20] 18 (11/19 1520) BP: (103-121)/(41-69) 121/69 (11/19 1520) SpO2:  [96 %-99 %] 97 % (11/19 1520) Weight:  [123.3 kg] 123.3 kg (11/19 0339)  Weight change: 1 kg Filed Weights   06/26/19 0930 06/26/19 1245 06/27/19 0339  Weight: 124.5 kg 122.5 kg 123.3 kg    Intake/Output:    Intake/Output Summary (Last 24 hours) at 06/27/2019 1807 Last data filed at 06/26/2019 2306 Gross per 24 hour  Intake -  Output 0 ml  Net 0 ml     Physical Exam: General:  Obese lady, laying in the bed  HEENT  moist oral mucous membranes  Pulm/lungs Mild  bibasilar crackles, [place] O2  CVS/Heart  irregular,    Abdomen:   Obese, soft, nontender  Extremities:  1-2+ pitting edema bilaterally  Neurologic:  Alert, oriented, able to follow commands  Skin:  No acute rashes  Access:  aVF       Basic Metabolic Panel:  Recent Labs  Lab 06/23/19 1627 06/24/19 0535 06/25/19 0506 06/26/19 0453 06/27/19 0621  NA 141 141 137 138 136  K 4.7 4.8 4.6 4.5 4.8  CL 99 100 99 94* 98  CO2 26 25 27 26 27   GLUCOSE 92 85 112* 87 83  BUN 61* 70* 43* 55* 35*  CREATININE 8.43* 9.48* 6.68* 7.74* 6.05*  CALCIUM 7.6* 7.8* 7.7* 7.8* 8.1*     CBC: Recent Labs  Lab 06/23/19 1627 06/24/19 1120 06/26/19 0453 06/27/19 0621   WBC 7.5 6.5 7.2 6.7  NEUTROABS 5.0  --   --   --   HGB 8.4* 7.5* 8.5* 9.1*  HCT 26.7* 24.1* 26.6* 28.3*  MCV 94.0 96.8 93.3 95.3  PLT 244 227 244 230      Lab Results  Component Value Date   HEPBSAG Negative 01/19/2017   HEPBSAB Reactive 01/19/2017      Microbiology:  Recent Results (from the past 240 hour(s))  SARS CORONAVIRUS 2 (TAT 6-24 HRS) Nasopharyngeal Nasopharyngeal Swab     Status: None   Collection Time: 06/23/19  7:36 PM   Specimen: Nasopharyngeal Swab  Result Value Ref Range Status   SARS Coronavirus 2 NEGATIVE NEGATIVE Final    Comment: (NOTE) SARS-CoV-2 target nucleic acids are NOT DETECTED. The SARS-CoV-2 RNA is generally detectable in upper and lower respiratory specimens during the acute phase of infection. Negative results do not preclude SARS-CoV-2 infection, do not rule out co-infections with other pathogens, and should not be used as the sole basis for treatment or other patient management decisions. Negative results must be combined with clinical observations, patient history, and epidemiological information. The expected result is Negative. Fact Sheet for Patients: SugarRoll.be Fact Sheet for Healthcare Providers: https://www.woods-mathews.com/ This test is not yet approved or cleared by the  [place] Architectural technologist and  has been authorized for detection and/or diagnosis of SARS-CoV-2 by FDA under an Print production planner (EUA). This EUA will remain  in effect (meaning this test can be used) for the duration of the COVID-19 declaration under Section 56 4(b)(1) of the Act, 21 U.S.C. section 360bbb-3(b)(1), unless the authorization is terminated or revoked sooner. Performed at [facility], [place] [address], [place], [place] [address]   MRSA PCR Screening     Status: None   Collection Time: 06/25/19  3:26 PM   Specimen: Nasopharyngeal  Result Value Ref Range Status   MRSA by PCR NEGATIVE NEGATIVE  Final    Comment:        The GeneXpert MRSA Assay (FDA approved for NASAL specimens only), is one component of a comprehensive MRSA colonization surveillance program. It is not intended to diagnose MRSA infection nor to guide or monitor treatment for MRSA infections. Performed at [facility], [address] [place],  [address]     Coagulation Studies: No results for input(s): LABPROT, INR in the last 72 hours.  Urinalysis: No results for input(s): COLORURINE, LABSPEC, PHURINE, GLUCOSEU, HGBUR, BILIRUBINUR, KETONESUR, PROTEINUR, UROBILINOGEN, NITRITE, LEUKOCYTESUR in the last 72 hours.  Invalid input(s): APPERANCEUR    Imaging: No results found.   Medications:   . sodium chloride     . sodium chloride   Intravenous Once  . apixaban  2.5 mg Oral BID  . aspirin EC  81 mg Oral Daily  . atorvastatin  80 mg Oral Daily  . carvedilol  25 mg Oral BID  . cinacalcet  60 mg Oral BID WC  . epoetin (EPOGEN/PROCRIT) injection  4,000 Units Intravenous Q M,W,F-HD  . gabapentin  100 mg Oral BID  . multivitamin  1 tablet Oral QHS  . sevelamer carbonate  1,600 mg Oral TID WC & HS  . sodium chloride flush  3 mL Intravenous Q12H  . torsemide  60 mg Oral Daily   sodium chloride, acetaminophen, ALPRAZolam, ondansetron (ZOFRAN) IV, sodium chloride flush, zolpidem  Assessment/ Plan:  73 y.o. African-American female with end-stage renal disease on hemodialysis, rheumatoid arthritis, history of stroke, congestive heart failure, multiple myeloma, hypertension, hyperlipidemia, obstructive sleep apnea  MWF [facility] [address] /   Active Problems:   Acute pulmonary edema (HCC)   History of anemia due to chronic kidney disease   ESRD (end stage renal disease) ([place])   #. ESRD with volume overload and acute pulmonary edema Patient to be dialyzed on MWF schedule neg 7 Liters this admission so far Blood pressure is low normal.  Will discontinue amlodipine to  allow for higher blood pressures which will help with volume removal. Decrease dose of coreg to once a day in evening only Change iv lasix to oral torsemide Standing weight if possible  #. Anemia of CKD  Lab Results  Component Value Date   HGB 9.1 (L) 06/27/2019  continue low dose EPO with HD (MWF)  #. SHPTH  No results found for: PTH Lab Results  Component Value Date   PHOS 8.8 (H) 05/13/2019   Monitor calcium and phos level during this admission Continue sevelamer.    Cardiac: 2D echo June 24, 2019: LVEF 60 to 65%.  Mild aortic valve sclerosis.    LOS: [place] 11/19/20206:07 PM  [place], [place]

## 2019-06-27 NOTE — Evaluation (Signed)
Physical Therapy Evaluation Patient Details Name: Cassandra Joseph MRN: [id number] DOB: March 17, 1946 Today's Date: 06/27/2019   History of Present Illness  Pt is admitted for acute pulmonary edema. History includes ESRD on 3L of O2 at home, HTN, CVA, and mult myeloma.   Clinical Impression  Pt is a pleasant 73 year old female who was admitted for pulmonary edema. Pt performs bed mobility with min/mod assist, transfers with mod assist +2, and ambulation with min assist +2 and RW. Pt reports her family assist her at home and she requires 2 person assist at baseline. Pt demonstrates deficits with strength/mobility/endurance. Would benefit from skilled PT to address above deficits and promote optimal return to PLOF. Recommend transition to [place] upon discharge from acute hospitalization.     Follow Up Recommendations Home health PT;Supervision/Assistance - 24 hour    Equipment Recommendations  None recommended by PT    Recommendations for Other Services       Precautions / Restrictions Precautions Precautions: Fall Restrictions Weight Bearing Restrictions: No      Mobility  Bed Mobility Overal bed mobility: Needs Assistance Bed Mobility: Supine to Sit     Supine to sit: Min assist;Mod assist     General bed mobility comments: needs assist for upper/lower body. Once seated at EOB, able to sit with upright posture. O2 donned for mobility  Transfers Overall transfer level: Needs assistance Equipment used: Rolling walker (2 wheeled) Transfers: Sit to/from Stand Sit to Stand: Mod assist;+2 physical assistance;From elevated surface         General transfer comment: needs assist for standing. Once standing, upright posture noted. Cues for correct technique  Ambulation/Gait Ambulation/Gait assistance: Min assist;+2 physical assistance Gait Distance (Feet): 5 Feet Assistive device: Rolling walker (2 wheeled) Gait Pattern/deviations: Step-to pattern     General Gait Details:  ambulated with slow technique to recliner. Needs assist for sequencing. Slow transfer. O2 removed due to length of cord. Reapplied once seated in recliner  Stairs            Wheelchair Mobility    Modified Rankin (Stroke Patients Only)       Balance Overall balance assessment: Needs assistance Sitting-balance support: Feet supported;No upper extremity supported Sitting balance-Leahy Scale: Good     Standing balance support: Bilateral upper extremity supported Standing balance-Leahy Scale: Fair                               Pertinent Vitals/Pain Pain Assessment: No/denies pain    Home Living Family/patient expects to be discharged to:: Private residence Living Arrangements: Spouse/significant other Available Help at Discharge: Friend(s);Available PRN/intermittently;Family Type of Home: House Home Access: Level entry     Home Layout: One level Home Equipment: Shower seat;Wheelchair - [employer] - 2 wheels;Cane - single point      Prior Function Level of Independence: Needs assistance         Comments: pt reports she has Touched by an angel coming in the home to assist with ADLs, in addition to [facility] services. She uses RW for short distances and WC for long distances     Hand Dominance        Extremity/Trunk Assessment   Upper Extremity Assessment Upper Extremity Assessment: Generalized weakness(B UE grossly 4/5)    Lower Extremity Assessment Lower Extremity Assessment: Generalized weakness(B LE grossly 3+/5 and swollen)       Communication   Communication: No difficulties  Cognition Arousal/Alertness: Awake/alert Behavior During Therapy:  WFL for tasks assessed/performed Overall Cognitive Status: Within Functional Limits for tasks assessed                                 General Comments: pt annoyed that she has yet to receive bath from aide      General Comments      Exercises Other Exercises Other Exercises:  once in recliner, seated ther-ex performed including AP, alt. marching, hip add squeezes, and LAQ. ALl ther-ex performed x 10 reps with cga.   Assessment/Plan    PT Assessment Patient needs continued PT services  PT Problem List Decreased strength;Decreased activity tolerance;Decreased balance;Decreased mobility       PT Treatment Interventions DME instruction;Gait training;Therapeutic activities;Therapeutic exercise    PT Goals (Current goals can be found in the Care Plan section)  Acute Rehab PT Goals Patient Stated Goal: to go home PT Goal Formulation: With patient Time For Goal Achievement: 07/11/19 Potential to Achieve Goals: Good    Frequency Min 2X/week   Barriers to discharge        Co-evaluation               AM-PAC PT "6 Clicks" Mobility  Outcome Measure Help needed turning from your back to your side while in a flat bed without using bedrails?: A Lot Help needed moving from lying on your back to sitting on the side of a flat bed without using bedrails?: A Lot Help needed moving to and from a bed to a chair (including a wheelchair)?: A Lot Help needed standing up from a chair using your arms (e.g., wheelchair or bedside chair)?: A Lot Help needed to walk in hospital room?: A Little Help needed climbing 3-5 steps with a railing? : A Lot 6 Click Score: 13    End of Session Equipment Utilized During Treatment: Gait belt;Oxygen Activity Tolerance: Patient tolerated treatment well Patient left: in chair;with chair alarm set Nurse Communication: Mobility status PT Visit Diagnosis: Unsteadiness on feet (R26.81);Muscle weakness (generalized) (M62.81)    Time: 2841-3244 PT Time Calculation (min) (ACUTE ONLY): 20 min   Charges:   PT Evaluation $PT Eval Moderate Complexity: 1 Mod PT Treatments $Therapeutic Exercise: 8-22 mins        Greggory Stallion, PT, DPT [phone]   Kahron Kauth 06/27/2019, 4:47 PM

## 2019-06-28 LAB — BASIC METABOLIC PANEL
Anion gap: 14 (ref 5–15)
BUN: 58 mg/dL — ABNORMAL HIGH (ref 8–23)
CO2: 26 mmol/L (ref 22–32)
Calcium: 7.7 mg/dL — ABNORMAL LOW (ref 8.9–10.3)
Chloride: 95 mmol/L — ABNORMAL LOW (ref 98–111)
Creatinine, Ser: 7.49 mg/dL — ABNORMAL HIGH (ref 0.44–1.00)
GFR calc Af Amer: 6 mL/min — ABNORMAL LOW (ref >60)
GFR calc non Af Amer: 5 mL/min — ABNORMAL LOW (ref >60)
Glucose, Bld: 79 mg/dL (ref 70–99)
Potassium: 5.5 mmol/L — ABNORMAL HIGH (ref 3.5–5.1)
Sodium: 135 mmol/L (ref 135–145)

## 2019-06-28 LAB — CBC
HCT: 28.8 % — ABNORMAL LOW (ref 36.0–46.0)
Hemoglobin: 8.6 g/dL — ABNORMAL LOW (ref 12.0–15.0)
MCH: 29.4 pg (ref 26.0–34.0)
MCHC: 29.9 g/dL — ABNORMAL LOW (ref 30.0–36.0)
MCV: 98.3 fL (ref 80.0–100.0)
Platelets: 255 10*3/uL (ref 150–400)
RBC: 2.93 MIL/uL — ABNORMAL LOW (ref 3.87–5.11)
RDW: 15.9 % — ABNORMAL HIGH (ref 11.5–15.5)
WBC: 7.5 10*3/uL (ref 4.0–10.5)
nRBC: 0 % (ref 0.0–0.2)

## 2019-06-28 LAB — RENAL FUNCTION PANEL
Albumin: 2.9 g/dL — ABNORMAL LOW (ref 3.5–5.0)
Anion gap: 15 (ref 5–15)
BUN: 58 mg/dL — ABNORMAL HIGH (ref 8–23)
CO2: 25 mmol/L (ref 22–32)
Calcium: 7.7 mg/dL — ABNORMAL LOW (ref 8.9–10.3)
Chloride: 96 mmol/L — ABNORMAL LOW (ref 98–111)
Creatinine, Ser: 7.51 mg/dL — ABNORMAL HIGH (ref 0.44–1.00)
GFR calc Af Amer: 6 mL/min — ABNORMAL LOW (ref >60)
GFR calc non Af Amer: 5 mL/min — ABNORMAL LOW (ref >60)
Glucose, Bld: 81 mg/dL (ref 70–99)
Phosphorus: 5.4 mg/dL — ABNORMAL HIGH (ref 2.5–4.6)
Potassium: 5.5 mmol/L — ABNORMAL HIGH (ref 3.5–5.1)
Sodium: 136 mmol/L (ref 135–145)

## 2019-06-28 MED ORDER — MIDODRINE HCL 10 MG PO TABS: 10.0000 mg | ORAL_TABLET | Freq: Three times a day (TID) | ORAL | 0 refills | Status: AC

## 2019-06-28 MED ORDER — CHLORHEXIDINE GLUCONATE CLOTH 2 % EX PADS: 6.0000 | MEDICATED_PAD | Freq: Every day | CUTANEOUS | Status: DC

## 2019-06-28 MED ORDER — ATORVASTATIN CALCIUM 80 MG PO TABS: 80.0000 mg | ORAL_TABLET | Freq: Every day | ORAL | 0 refills | Status: AC

## 2019-06-28 MED ORDER — APIXABAN 2.5 MG PO TABS: 2.5000 mg | ORAL_TABLET | Freq: Two times a day (BID) | ORAL | 0 refills | Status: DC

## 2019-06-28 MED ORDER — MIDODRINE HCL 5 MG PO TABS
10.0000 mg | ORAL_TABLET | Freq: Three times a day (TID) | ORAL | Status: DC
  Administered 2019-06-28: 10 mg via ORAL
  Filled 2019-06-28: qty 2

## 2019-06-28 MED ORDER — TORSEMIDE 20 MG PO TABS: 60.0000 mg | ORAL_TABLET | Freq: Every day | ORAL | 0 refills | Status: DC

## 2019-06-28 MED ORDER — TRAMADOL HCL 50 MG PO TABS: 50.0000 mg | ORAL_TABLET | Freq: Once | ORAL | Status: DC

## 2019-06-28 NOTE — Progress Notes (Signed)
[facility], [place] 06/28/19  Subjective:   LOS: 5 No intake/output data recorded.  she is admitted for shortness of breath, orthopnea, A day prior to admission she had chest pain and some burning sensation   She also reports chronically decreased appetite although no nausea or vomiting She has noticed some increasing cough, increase sputum production but no fever She uses continuous oxygen and CPAP at home   Seen during dialysis Overall improved Net neg 7 liters since admission   HEMODIALYSIS FLOWSHEET:  Blood Flow Rate (mL/min): 400 mL/min Arterial Pressure (mmHg): -120 mmHg Venous Pressure (mmHg): 130 mmHg Transmembrane Pressure (mmHg): 70 mmHg Ultrafiltration Rate (mL/min): 0 mL/min Dialysate Flow Rate (mL/min): 800 ml/min Conductivity: Machine : 14 Conductivity: Machine : 14 Dialysis Fluid Bolus: Normal Saline Bolus Amount (mL): 250 mL      Objective:  Vital signs in last 24 hours:  Temp:  [97.7 F (36.5 C)-98.6 F (37 C)] 98.6 F (37 C) (11/20 1316) Pulse Rate:  [58-88] 87 (11/20 1316) Resp:  [16-23] 19 (11/20 1316) BP: (83-121)/(39-84) 112/84 (11/20 1316) SpO2:  [99 %-100 %] 100 % (11/20 1316) Weight:  [123.4 kg] 123.4 kg (11/20 0346)  Weight change: -1.076 kg Filed Weights   06/27/19 0339 06/28/19 0346  Weight: 123.3 kg 123.4 kg    Intake/Output:    Intake/Output Summary (Last 24 hours) at 06/28/2019 1527 Last data filed at 06/28/2019 1316 Gross per 24 hour  Intake -  Output -60 ml  Net 60 ml     Physical Exam: General:  Obese lady, laying in the bed  HEENT  moist oral mucous membranes  Pulm/lungs  clear to auscultation today, [place] O2  CVS/Heart  irregular,    Abdomen:   Obese, soft, nontender  Extremities:  1-2+ pitting edema bilaterally  Neurologic:  Alert, oriented, able to follow commands  Skin:  No acute rashes  Access:  aVF       Basic Metabolic Panel:  Recent Labs  Lab 06/24/19 0535  06/25/19 0506 06/26/19 0453 06/27/19 0621 06/28/19 0618  NA 141 137 138 136 136  135  K 4.8 4.6 4.5 4.8 5.5*  5.5*  CL 100 99 94* 98 96*  95*  CO2 25 27 26 27 25  26   GLUCOSE 85 112* 87 83 81  79  BUN 70* 43* 55* 35* 58*  58*  CREATININE 9.48* 6.68* 7.74* 6.05* 7.51*  7.49*  CALCIUM 7.8* 7.7* 7.8* 8.1* 7.7*  7.7*  PHOS  --   --   --   --  5.4*     CBC: Recent Labs  Lab 06/23/19 1627 06/24/19 1120 06/26/19 0453 06/27/19 0621 06/28/19 0618  WBC 7.5 6.5 7.2 6.7 7.5  NEUTROABS 5.0  --   --   --   --   HGB 8.4* 7.5* 8.5* 9.1* 8.6*  HCT 26.7* 24.1* 26.6* 28.3* 28.8*  MCV 94.0 96.8 93.3 95.3 98.3  PLT 244 227 244 230 255      Lab Results  Component Value Date   HEPBSAG Negative 01/19/2017   HEPBSAB Reactive 01/19/2017      Microbiology:  Recent Results (from the past 240 hour(s))  SARS CORONAVIRUS 2 (TAT 6-24 HRS) Nasopharyngeal Nasopharyngeal Swab     Status: None   Collection Time: 06/23/19  7:36 PM   Specimen: Nasopharyngeal Swab  Result Value Ref Range Status   SARS Coronavirus 2 NEGATIVE NEGATIVE Final    Comment: (NOTE) SARS-CoV-2 target nucleic acids are NOT DETECTED. The  SARS-CoV-2 RNA is generally detectable in upper and lower respiratory specimens during the acute phase of infection. Negative results do not preclude SARS-CoV-2 infection, do not rule out co-infections with other pathogens, and should not be used as the sole basis for treatment or other patient management decisions. Negative results must be combined with clinical observations, patient history, and epidemiological information. The expected result is Negative. Fact Sheet for Patients: SugarRoll.be Fact Sheet for Healthcare Providers: https://www.woods-mathews.com/ This test is not yet approved or cleared by the [place] FDA and  has been authorized for detection and/or diagnosis of SARS-CoV-2 by FDA under an Emergency Use  Authorization (EUA). This EUA will remain  in effect (meaning this test can be used) for the duration of the COVID-19 declaration under Section 56 4(b)(1) of the Act, 21 U.S.C. section 360bbb-3(b)(1), unless the authorization is terminated or revoked sooner. Performed at [facility], [place] [address], [place], [place] [address]   MRSA PCR Screening     Status: None   Collection Time: 06/25/19  3:26 PM   Specimen: Nasopharyngeal  Result Value Ref Range Status   MRSA by PCR NEGATIVE NEGATIVE Final    Comment:        The GeneXpert MRSA Assay (FDA approved for NASAL specimens only), is one component of a comprehensive MRSA colonization surveillance program. It is not intended to diagnose MRSA infection nor to guide or monitor treatment for MRSA infections. Performed at [facility], [address] [place], [place] [address]     Coagulation Studies: No results for input(s): LABPROT, INR in the last 72 hours.  Urinalysis: No results for input(s): COLORURINE, LABSPEC, PHURINE, GLUCOSEU, HGBUR, BILIRUBINUR, KETONESUR, PROTEINUR, UROBILINOGEN, NITRITE, LEUKOCYTESUR in the last 72 hours.  Invalid input(s): APPERANCEUR    Imaging: No results found.   Medications:   . sodium chloride     . sodium chloride   Intravenous Once  . apixaban  2.5 mg Oral BID  . aspirin EC  81 mg Oral Daily  . atorvastatin  80 mg Oral Daily  . carvedilol  25 mg Oral QPM  . Chlorhexidine Gluconate Cloth  6 each Topical Daily  . cinacalcet  60 mg Oral BID WC  . epoetin (EPOGEN/PROCRIT) injection  4,000 Units Intravenous Q M,W,F-HD  . gabapentin  100 mg Oral BID  . midodrine  10 mg Oral TID WC  . multivitamin  1 tablet Oral QHS  . sevelamer carbonate  1,600 mg Oral TID WC & HS  . sodium chloride flush  3 mL Intravenous Q12H  . torsemide  60 mg Oral Daily  . traMADol  50 mg Oral Once   sodium chloride, acetaminophen, ALPRAZolam, ondansetron (ZOFRAN) IV, sodium chloride flush,  zolpidem  Assessment/ Plan:  73 y.o. African-American female with end-stage renal disease on hemodialysis, rheumatoid arthritis, history of stroke, congestive heart failure, multiple myeloma, hypertension, hyperlipidemia, obstructive sleep apnea  MWF [facility] [address] /   Active Problems:   Acute pulmonary edema (HCC)   History of anemia due to chronic kidney disease   ESRD (end stage renal disease) ([place])   #. ESRD with volume overload and acute pulmonary edema Patient to be dialyzed on MWF schedule neg 7 Liters this admission so far Blood pressure is low normal.  Will discontinue amlodipine to allow for higher blood pressures which will help with volume removal. Decrease dose of coreg to once a day in evening only Change iv lasix to oral torsemide Standing weights if possible  #.  Anemia of CKD  Lab Results  Component Value Date   HGB 8.6 (L) 06/28/2019  continue low dose EPO with HD (MWF)  #. SHPTH  No results found for: PTH Lab Results  Component Value Date   PHOS 5.4 (H) 06/28/2019   Monitor calcium and phos level during this admission Continue sevelamer.    Cardiac: 2D echo June 24, 2019: LVEF 60 to 65%.  Mild aortic valve sclerosis.    LOS: [place] 11/20/20203:27 PM  [place], [place]

## 2019-06-28 NOTE — TOC Benefit Eligibility Note (Signed)
Transition of Care [facility]) Benefit Eligibility Note    Patient Details  Name: Cassandra Joseph MRN: [id number] Date of Birth: 1945/09/03   Medication/Dose: Eliquis 2.5mg  BID  Covered?: Yes  Prescription Coverage Preferred Pharmacy: [facility], [facility] or [facility] considered preferred pharmacies.  [facility] is non[facility]preferred.  Spoke with Person/Company/Phone Number:: Sharyn Lull with [employer] at [phone]  Co-Pay: $126.00 estimated cost for 90 day supply retail ([facility]) or $105.00 estimated cost for 90 day supply mail order  Prior Approval: No  Deductible: Unmet($100 deductible, unmet as of time of call.)   Dannette Barbara Phone Number: [phone] or [phone] 06/28/2019, 2:34 PM

## 2019-06-28 NOTE — Progress Notes (Signed)
Patient c/o of 7/10, sharp pain under right breast; does report improvement with movement. Reports that this has been happening every night the last week. Pain seems likely GI in nature, patient reports that she has not had a bowel movement at this time. NP Bodenheimer made aware. Orders placed for simethicone. Will administer as ordered.   Update: Patient states that she has had a bowel movement and feels better. PO simethicone given. No other complaints at this time. Will continue to monitor.  Iran Sizer M

## 2019-06-28 NOTE — Discharge Summary (Signed)
[facility] Discharge Summary   Patient: Cassandra Joseph [id number]   PCP: Tracie Harrier, MD DOB: 1946-01-30   Date of admission: 06/23/2019   Date of discharge:  06/28/2019    Discharge Diagnoses:  Principal diagnosis Acute on chronic combined systolic and diastolic CHF  Active Problems:   Acute pulmonary edema (HCC)   History of anemia due to chronic kidney disease   ESRD (end stage renal disease) ([place])   Admitted From: home Disposition:  Home with home health  Recommendations for Outpatient Follow-up:  1. PCP: Follow-up in 1 week 2. Follow up LABS/TEST: None  Follow-up Information    Tracie Harrier, MD. Schedule an appointment as soon as possible for a visit on 07/02/2019.   Specialty: Internal Medicine Why: Please schedule hospital follow up appt within 5-7 days after discharge...Marland KitchenMarland KitchenAPPOINTMENT AT 3:15PM Contact information: [place] [phone]          Diet recommendation: Renal diet  Activity: The patient is advised to gradually reintroduce usual activities,as tolerated .  Discharge Condition: good  Code Status: Full code   History of present illness: As per the H and P dictated on admission, "Cassandra Joseph  is a 73 y.o. African-American female with a known history of multiple medical problems including end-stage renal disease on hemodialysis on Monday Wednesday Friday, who missed hemodialysis on Wednesday however had it on Friday, hypertension, diastolic CHF, obstructive sleep apnea on home CPAP, CVA and multiple myeloma, who presented to the emergency room acute onset of worsening dyspnea with associated paroxysmal nocturnal dyspnea as well as midsternal chest burning and indigestion feeling that she graded 7/10 in severity with no radiation however with palpitations since last night.  She denied any significantly worsening lower extremity edema.  She admits to orthopnea.  She has been  having occasional dry cough.  No fever or chills.  No nausea or vomiting or abdominal pain.  She does not make urine.  No recent sick exposure to COVID-19.  She has been feeling right medial thigh swelling with mild tenderness.  Upon presentation to the emergency room, respiratory to 28 and pulse symmetry was 99% on 3 L O2 by nasal cannula and vital signs otherwise were within normal.  Labs revealed potassium 4.7 with BUN of 61 and creatinine of 8.43, alkaline phosphatase of 264 and albumin of 2.9 with calcium of 7.6 close to previous levels.  BNP was 1339 and high-sensitivity troponin I 23.  CBC showed hemoglobin of 8.4 hematocrit 26.7 close to baseline.  Portable chest ray showed cardiomegaly with acute pulmonary edema.  EKG showed atrial fibrillation with controlled ventricular response of 91 with PVCs and prolonged QT interval with QTC of 509 MS. COVID-19 test is currently pending."  Hospital Course:  Summary of her active problems in the hospital is as following. 1. Acute on chronic diastolic congestive heart failure.  - 2D echo in 2018 revealing preserved EF with grade 2 diastolic dysfunction. She has documented moderate to severe mitral stenosis  - Appreciate Dr. Ubaldo Glassing input.  - continue with carvedilol at 25 mg twice daily,change lasix to torsemide 60 mg once daily - low-sodium diet. Repeat echo shows similar finding as before  2. End-stage renal disease on hemodialysis on Monday Wednesday and Friday with fluid overload. - HD per nephro -neg 7.2Liters this admission so far - Nephrodiscontinuedamlodipine to allow for higher blood pressures which will help with volume removal at HD. - also changediv lasix to oral torsemide  3.  Atrial fibrillation with controlled ventricular responsewith associated chest pain - new onset - Cardio started Eliquis at 2.5 mg given end-stage renal disease..  - continue Coregat current dose as her rate is well controlled.  4.Right medial  thigh swelling likely subcutaneous lipomas. This can be followed up by her primary care physician on an outpatient basis. No apparent signs of cellulitis.  5.Obstructive sleep apnea. continue her CPAP nightly. That should help with #1.  6.Hypertension. continue her Norvasc and Coreg.  7. Peripheral neuropathy. continue Neurontin.  8. Dyslipidemia. continue statin therapy.  9. Anemia of chronic kidney disease - hemoglobin down to 7.5 (was 9.9 about a month ago) -s/p1 packed red blood cell transfusion - Hb 8.5  10 morbid obesity Body mass index is 43.87 kg/m.  Dietary consultation outpatient.  Patient was ambulatory without any assistance. On the day of the discharge the patient's vitals were stable, and no other acute medical condition were reported by patient. the patient was felt safe to be discharge at Home with Home health.  Consultants: Nephrology, cardiology Procedures: HD  DISCHARGE MEDICATION: Allergies as of 06/28/2019      Reactions   Shellfish Allergy Shortness Of Breath      Medication List    STOP taking these medications   amLODipine 10 MG tablet Commonly known as: NORVASC     TAKE these medications   apixaban 2.5 MG Tabs tablet Commonly known as: ELIQUIS Take 1 tablet (2.5 mg total) by mouth 2 (two) times daily.   aspirin EC 81 MG tablet Take 81 mg by mouth daily.   atorvastatin 80 MG tablet Commonly known as: LIPITOR Take 1 tablet (80 mg total) by mouth daily. Start taking on: June 29, 2019 What changed:   medication strength  how much to take   b complex-vitamin c-folic acid 0.8 MG Tabs tablet Take 1 tablet by mouth daily.   carvedilol 25 MG tablet Commonly known as: COREG Take 25 mg by mouth 2 (two) times daily.   cinacalcet 60 MG tablet Commonly known as: SENSIPAR Take 60 mg by mouth 2 (two) times daily.   gabapentin 100 MG capsule Commonly known as: NEURONTIN Take 100 mg by mouth 2 (two) times daily.     midodrine 10 MG tablet Commonly known as: PROAMATINE Take 1 tablet (10 mg total) by mouth 3 (three) times daily with meals.   sevelamer carbonate 800 MG tablet Commonly known as: RENVELA Take 800 mg by mouth 4 (four) times daily.   torsemide 20 MG tablet Commonly known as: DEMADEX Take 3 tablets (60 mg total) by mouth daily. Start taking on: June 29, 2019      Allergies  Allergen Reactions   Shellfish Allergy Shortness Of Breath   Discharge Instructions    Diet - low sodium heart healthy   Complete by: As directed    Discharge instructions   Complete by: As directed    It is important that you read the given instructions as well as go over your medication list with RN to help you understand your care after this hospitalization.  Please follow-up with PCP in 1-2 weeks.  Please note that NO REFILLS for any discharge medications will be authorized once you are discharged, as it is imperative that you return to your primary care physician (or establish a relationship with a primary care physician if you do not have one) for your aftercare needs so that they can reassess your need for medications and monitor your lab values.  Please request  your primary care physician to go over all Hospital Tests and Procedure/Radiological results at the follow up. Please get all Hospital records sent to your PCP by signing hospital release before you go home.   Do not take more than prescribed Pain, Sleep and Anxiety Medications.  You were cared for by a hospitalist during your hospital stay. If you have any questions about your discharge medications or the care you received while you were in the hospital after you are discharged, you can call the unit _0 @ you were admitted to and ask to speak with the hospitalist Berle Mull. Ask for Hospitalist on call if the hospitalist that took care of you is not available.   Once you are discharged, your primary care physician will handle any further  medical issues.  You Must read complete instructions/literature along with all the possible adverse reactions/side effects for all the Medicines you take and that have been prescribed to you. Take any new Medicines after you have completely understood and accept all the possible adverse reactions/side effects.  If you have smoked or chewed Tobacco in the last 2 yrs please STOP smoking If you drink alcohol, please safely reduce the use. Do not drive, operating heavy machinery, perform activities at heights, swimming or participation in water activities or provide baby sitting services under influence.  Wear Seat belts while driving.   Increase activity slowly   Complete by: As directed      Discharge Exam: Filed Weights   06/27/19 0339 06/28/19 0346  Weight: 123.3 kg 123.4 kg   Vitals:   06/28/19 1315 06/28/19 1316  BP: 112/84 112/84  Pulse: 68 87  Resp: (!) 21 19  Temp: 98.6 F (37 C) 98.6 F (37 C)  SpO2: 100% 100%   General: Appear in no distress, no Rash; Oral Mucosa Clear, moist. no Abnormal Mass Or lumps Cardiovascular: S1 and S2 Present, no Murmur, Respiratory: normal respiratory effort, Bilateral Air entry present and Clear to Auscultation, no Crackles, no wheezes Abdomen: Bowel Sound present, Soft and no tenderness, no hernia Extremities: no Pedal edema, no calf tenderness Neurology: alert and oriented to time, place, and person affect appropriate.  The results of significant diagnostics from this hospitalization (including imaging, microbiology, ancillary and laboratory) are listed below for reference.    Significant Diagnostic Studies: Dg Chest 1 View  Result Date: 06/23/2019 CLINICAL DATA:  Shortness of breath, cough, chest pain EXAM: CHEST  1 VIEW COMPARISON:  05/12/2019 FINDINGS: Low volume underpenetrated AP portable examination with similar elevation of the hemidiaphragms and diffuse bilateral interstitial opacity. No new or focal airspace opacity. Cardiomegaly.  IMPRESSION: 1. Low volume underpenetrated AP portable examination with similar elevation of the hemidiaphragms and diffuse bilateral interstitial opacity. Findings are most consistent with edema. No new or focal airspace opacity. 2.  Cardiomegaly. Electronically Signed   By: Eddie Candle M.D.   On: 06/23/2019 18:45    Microbiology: Recent Results (from the past 240 hour(s))  SARS CORONAVIRUS 2 (TAT 6-24 HRS) Nasopharyngeal Nasopharyngeal Swab     Status: None   Collection Time: 06/23/19  7:36 PM   Specimen: Nasopharyngeal Swab  Result Value Ref Range Status   SARS Coronavirus 2 NEGATIVE NEGATIVE Final    Comment: (NOTE) SARS-CoV-2 target nucleic acids are NOT DETECTED. The SARS-CoV-2 RNA is generally detectable in upper and lower respiratory specimens during the acute phase of infection. Negative results do not preclude SARS-CoV-2 infection, do not rule out co-infections with other pathogens, and should not be used as  the sole basis for treatment or other patient management decisions. Negative results must be combined with clinical observations, patient history, and epidemiological information. The expected result is Negative. Fact Sheet for Patients: SugarRoll.be Fact Sheet for Healthcare Providers: https://www.woods-mathews.com/ This test is not yet approved or cleared by the [place] FDA and  has been authorized for detection and/or diagnosis of SARS-CoV-2 by FDA under an Emergency Use Authorization (EUA). This EUA will remain  in effect (meaning this test can be used) for the duration of the COVID-19 declaration under Section 56 4(b)(1) of the Act, 21 U.S.C. section 360bbb-3(b)(1), unless the authorization is terminated or revoked sooner. Performed at [facility], [place] [address], [place], [place] [address]   MRSA PCR Screening     Status: None   Collection Time: 06/25/19  3:26 PM   Specimen: Nasopharyngeal  Result Value Ref  Range Status   MRSA by PCR NEGATIVE NEGATIVE Final    Comment:        The GeneXpert MRSA Assay (FDA approved for NASAL specimens only), is one component of a comprehensive MRSA colonization surveillance program. It is not intended to diagnose MRSA infection nor to guide or monitor treatment for MRSA infections. Performed at [facility], [address] [place], [place] [address]      Labs: CBC: Recent Labs  Lab 06/23/19 1627 06/24/19 1120 06/26/19 0453 06/27/19 0621 06/28/19 0618  WBC 7.5 6.5 7.2 6.7 7.5  NEUTROABS 5.0  --   --   --   --   HGB 8.4* 7.5* 8.5* 9.1* 8.6*  HCT 26.7* 24.1* 26.6* 28.3* 28.8*  MCV 94.0 96.8 93.3 95.3 98.3  PLT 244 227 244 230 206   Basic Metabolic Panel: Recent Labs  Lab 06/24/19 0535 06/25/19 0506 06/26/19 0453 06/27/19 0621 06/28/19 0618  NA 141 137 138 136 136   135  K 4.8 4.6 4.5 4.8 5.5*   5.5*  CL 100 99 94* 98 96*   95*  CO2 _0 GLUCOSE 85 112* 87 83 81   79  BUN 70* 43* 55* 35* 58*   58*  CREATININE 9.48* 6.68* 7.74* 6.05* 7.51*   7.49*  CALCIUM 7.8* 7.7* 7.8* 8.1* 7.7*   7.7*  PHOS  --   --   --   --  5.4*   Liver Function Tests: Recent Labs  Lab 06/23/19 1627 06/28/19 0618  AST 22  --   ALT 11  --   ALKPHOS 264*  --   BILITOT 1.0  --   PROT 7.3  --   ALBUMIN 2.9* 2.9*   No results for input(s): LIPASE, AMYLASE in the last 168 hours. No results for input(s): AMMONIA in the last 168 hours. Cardiac Enzymes: No results for input(s): CKTOTAL, CKMB, CKMBINDEX, TROPONINI in the last 168 hours. BNP (last 3 results) Recent Labs    05/12/19 1939 06/23/19 1627  BNP 629.0* 1,339.0*   CBG: No results for input(s): GLUCAP in the last 168 hours.  Time spent: 35 minutes  Signed:  Berle Mull  [facility]  06/28/2019 6:53 PM

## 2019-06-28 NOTE — Progress Notes (Signed)
Discharge instructions explained to pt/ verbalized an understanding / iv and tele removed/ transported off unit via wheelchair.  

## 2019-06-28 NOTE — Care Management Important Message (Signed)
Important Message  Patient Details  Name: Cassandra Joseph MRN: [id number] Date of Birth: Sep 24, 1945   Medicare Important Message Given:  Yes     Dannette Barbara 06/28/2019, 12:31 PM

## 2019-06-28 NOTE — Progress Notes (Signed)
HD Initiated   06/28/19 0945  Vital Signs  Temp 98.6 F (37 C)  Temp Source Oral  Pulse Rate 80  Pulse Rate Source Dinamap  Resp 19  BP (!) 98/59  BP Location Left Arm  BP Method Automatic  Patient Position (if appropriate) Sitting  During Hemodialysis Assessment  Blood Flow Rate (mL/min) 400 mL/min  Arterial Pressure (mmHg) -90 mmHg  Venous Pressure (mmHg) 100 mmHg  Transmembrane Pressure (mmHg) 90 mmHg  Ultrafiltration Rate (mL/min) 860 mL/min  Dialysate Flow Rate (mL/min) 800 ml/min  Conductivity: Machine  14  HD Safety Checks Performed Yes  Dialysis Fluid Bolus Normal Saline  Bolus Amount (mL) 250 mL  Intra-Hemodialysis Comments Tx initiated

## 2019-06-28 NOTE — Plan of Care (Signed)
  Problem: Clinical Measurements: Goal: Ability to maintain clinical measurements within normal limits will improve Outcome: Progressing Goal: Respiratory complications will improve Outcome: Progressing Goal: Cardiovascular complication will be avoided Outcome: Progressing   Problem: Elimination: Goal: Will not experience complications related to bowel motility Outcome: Progressing   Problem: Education: Goal: Ability to demonstrate management of disease process will improve Outcome: Progressing Goal: Ability to verbalize understanding of medication therapies will improve Outcome: Progressing   Problem: Cardiac: Goal: Ability to achieve and maintain adequate cardiopulmonary perfusion will improve Outcome: Progressing

## 2019-06-28 NOTE — Progress Notes (Signed)
Pre HD Assessment   06/28/19 0935  Neurological  Level of Consciousness Alert  Orientation Level Oriented X4  Respiratory  Respiratory Pattern Regular;Unlabored  Bilateral Breath Sounds Diminished  Cardiac  Pulse Irregular  Cardiac Rhythm Atrial fibrillation  Vascular  R Radial Pulse +2  L Radial Pulse +2  Musculoskeletal  Musculoskeletal (WDL) X  Psychosocial  Psychosocial (WDL) WDL

## 2019-06-28 NOTE — Progress Notes (Signed)
Post HD     06/28/19 1316  Vital Signs  Temp 98.6 F (37 C)  Temp Source Oral  Pulse Rate 87  Pulse Rate Source Dinamap  Resp 19  BP 112/84  BP Location Left Arm  BP Method Automatic  Patient Position (if appropriate) Sitting  Oxygen Therapy  SpO2 100 %  O2 Device Nasal Cannula  O2 Flow Rate (L/min) 3 L/min  Pain Assessment  Pain Scale 0-10  Pain Score 0  Post-Hemodialysis Assessment  Rinseback Volume (mL) 250 mL  KECN 82.3 V  Dialyzer Clearance Lightly streaked  Duration of HD Treatment -hour(s) 3.5 hour(s)  Hemodialysis Intake (mL) 500 mL  UF Total -Machine (mL) 440 mL  Net UF (mL) -60 mL  Tolerated HD Treatment Yes  AVG/AVF Arterial Site Held (minutes)  (10)  AVG/AVF Venous Site Held (minutes)  (10)  Fistula / Graft Right Upper arm  No Placement Date or Time found.   Placed prior to admission: Yes  Orientation: Right  Access Location: Upper arm  Site Condition No complications  Fistula / Graft Assessment Bruit;Thrill;Present  Status Deaccessed  Drainage Description None

## 2019-06-28 NOTE — TOC Transition Note (Signed)
Transition of Care [facility]) - CM/SW Discharge Note   Patient Details  Name: Cassandra Joseph MRN: [id number] Date of Birth: 12/07/1945  [facility]) CM/SW Contact:  Candie Chroman, LCSW Phone Number: 06/28/2019, 2:53 PM   Clinical Narrative:  Patient has orders to discharge home today. Gave patient Eliquis card and information on benefits check. Her granddaughter will take her home when she gets off work. Patient will call her to remind her to bring her oxygen. [facility] is aware of discharge. No further concerns. CSW signing off.   Final next level of care: Home w Home Health Services Barriers to Discharge: Barriers Resolved   Patient Goals and CMS Choice     Choice offered to / list presented to : NA  Discharge Placement                Patient to be transferred to facility by: Her granddaughter will pick her up.   Patient and family notified of of transfer: 06/28/19  Discharge Plan and Services     Post Acute Care Choice: Resumption of Svcs/PTA Provider                    HH Arranged: PT, OT, RN [facility] Agency: [place] ([facility]) Date [place]: 06/28/19   Representative spoke with at [place]: Floydene Flock  Social Determinants of Health (SDOH) Interventions     Readmission Risk Interventions Readmission Risk Prevention Plan 06/25/2019 05/14/2019  Transportation Screening Complete Complete  PCP or Specialist Appt within 3-5 Days Complete Complete  HRI or [place] Complete Complete  Social Work Consult for [place] Planning/Counseling Complete Complete  Palliative Care Screening Not Applicable Not Applicable  Medication Review Press photographer) Complete Complete  Some recent data might be hidden

## 2019-06-28 NOTE — Progress Notes (Signed)
PT Cancellation Note  Patient Details Name: Cassandra Joseph MRN: [id number] DOB: [birth year]   Cancelled Treatment:    Reason Eval/Treat Not Completed: Other (comment). Pt currently out of room for HD at this time. Will re-attempt therapy next available date.   Willella Harding 06/28/2019, 11:30 AM  Greggory Stallion, PT, DPT [phone]

## 2019-06-28 NOTE — Progress Notes (Signed)
HD Completed   06/28/19 1315  Vital Signs  Temp 98.6 F (37 C)  Temp Source Oral  Pulse Rate 68  Pulse Rate Source Dinamap  Resp (!) 21  BP 112/84  BP Location Left Arm  BP Method Automatic  Patient Position (if appropriate) Sitting  Oxygen Therapy  SpO2 100 %  Pain Assessment  Pain Scale 0-10  Pain Score 0  Dialysis Weight  Weight  (unable to weigh in recliner)  Type of Weight Post-Dialysis  During Hemodialysis Assessment  KECN 82.3 KECN  Dialysis Fluid Bolus Normal Saline  Bolus Amount (mL) 250 mL  Intra-Hemodialysis Comments Tx completed;Tolerated well

## 2019-06-28 NOTE — Progress Notes (Signed)
Pre HD    06/28/19 0934  Vital Signs  Temp 98.6 F (37 C)  Temp Source Oral  Pulse Rate 84  Pulse Rate Source Dinamap  Resp 17  BP (!) 96/45  BP Location Left Arm  BP Method Automatic  Patient Position (if appropriate) Sitting  Oxygen Therapy  SpO2 100 %  Pain Assessment  Pain Scale 0-10  Pain Score 0  Dialysis Weight  Weight  (unable to weigh pt)  Type of Weight Pre-Dialysis  Time-Out for Hemodialysis  What Procedure? HD   Pt Identifiers(min of two) First/Last Name;MRN/Account#;Pt's DOB(use if MRN/Acct# not available  Correct Site? Yes  Correct Side? Yes  Correct Procedure? Yes  Consents Verified? Yes  Rad Studies Available? N/A  Safety Precautions Reviewed? Yes  [place] Number 3  UF/Alarm Test Passed  Conductivity: Meter 14  Conductivity: Machine  14  pH 7  Reverse Osmosis main  Normal Saline Lot Number [id number]  Dialyzer Lot Number [id number]  Disposable Set Lot Number [id number]  Dialysate Acid Bath Lot Number [id number]  Dialysate HCO3 Bath Lot Number [id number]  Machine Temperature 98.6 F (37 C)  Musician and Audible Yes  Blood Lines Intact and Secured Yes  Pre Treatment Patient Checks  Vascular access used during treatment Fistula  Hepatitis B Surface Antigen Results Negative  Date Hepatitis B Surface Antigen Drawn 06/12/19  Hepatitis B Surface Antibody  (<10)  Date Hepatitis B Surface Antibody Drawn 05/20/19  Hemodialysis Consent Verified Yes  Hemodialysis Standing Orders Initiated Yes  ECG (Telemetry) Monitor On Yes  Prime Ordered Normal Saline  Length of  DialysisTreatment -hour(s) 3.5 Hour(s)  Dialysis Treatment Comments  (Try in chair )  Dialyzer Elisio 17H NR  Dialysate 3K;2.5 Ca  Dialysate Flow Ordered 800  Blood Flow Rate Ordered 400 mL/min  Ultrafiltration Goal 860 Liters  Dialysis Blood Pressure Support Ordered Normal Saline  Education / Care Plan  Dialysis Education Provided Yes  Documented Education in Care Plan  Yes  Outpatient Plan of Care Reviewed and on Chart Yes  Fistula / Graft Right Upper arm  No Placement Date or Time found.   Placed prior to admission: Yes  Orientation: Right  Access Location: Upper arm  Site Condition No complications  Fistula / Graft Assessment Bruit;Thrill;Present  Status Accessed  Needle Size 15  Drainage Description None

## 2020-05-21 ENCOUNTER — Other Ambulatory Visit: Payer: Self-pay | Admitting: Internal Medicine

## 2020-05-21 DIAGNOSIS — Z1231 Encounter for screening mammogram for malignant neoplasm of breast: Secondary | ICD-10-CM

## 2020-08-07 ENCOUNTER — Inpatient Hospital Stay
Admission: EM | Admit: 2020-08-07 | Discharge: 2020-08-17 | DRG: 177 | Disposition: A | Discharge status: Skilled Nursing Facility | Payer: Medicare Other | Attending: Internal Medicine | Admitting: Internal Medicine

## 2020-08-07 ENCOUNTER — Emergency Department: Payer: Medicare Other

## 2020-08-07 ENCOUNTER — Other Ambulatory Visit: Payer: Self-pay

## 2020-08-07 DIAGNOSIS — U071 COVID-19: Secondary | ICD-10-CM | POA: Diagnosis not present

## 2020-08-07 DIAGNOSIS — D631 Anemia in chronic kidney disease: Secondary | ICD-10-CM | POA: Diagnosis present

## 2020-08-07 DIAGNOSIS — Z79899 Other long term (current) drug therapy: Secondary | ICD-10-CM

## 2020-08-07 DIAGNOSIS — R531 Weakness: Secondary | ICD-10-CM

## 2020-08-07 DIAGNOSIS — Z9981 Dependence on supplemental oxygen: Secondary | ICD-10-CM

## 2020-08-07 DIAGNOSIS — Z90710 Acquired absence of both cervix and uterus: Secondary | ICD-10-CM

## 2020-08-07 DIAGNOSIS — J1282 Pneumonia due to coronavirus disease 2019: Secondary | ICD-10-CM

## 2020-08-07 DIAGNOSIS — Z91013 Allergy to seafood: Secondary | ICD-10-CM

## 2020-08-07 DIAGNOSIS — D72819 Decreased white blood cell count, unspecified: Secondary | ICD-10-CM | POA: Diagnosis present

## 2020-08-07 DIAGNOSIS — Z8673 Personal history of transient ischemic attack (TIA), and cerebral infarction without residual deficits: Secondary | ICD-10-CM

## 2020-08-07 DIAGNOSIS — Z6841 Body Mass Index (BMI) 40.0 and over, adult: Secondary | ICD-10-CM

## 2020-08-07 DIAGNOSIS — Z89422 Acquired absence of other left toe(s): Secondary | ICD-10-CM

## 2020-08-07 DIAGNOSIS — Z9115 Patient's noncompliance with renal dialysis: Secondary | ICD-10-CM

## 2020-08-07 DIAGNOSIS — I5032 Chronic diastolic (congestive) heart failure: Secondary | ICD-10-CM | POA: Diagnosis present

## 2020-08-07 DIAGNOSIS — Z90722 Acquired absence of ovaries, bilateral: Secondary | ICD-10-CM

## 2020-08-07 DIAGNOSIS — Z87891 Personal history of nicotine dependence: Secondary | ICD-10-CM

## 2020-08-07 DIAGNOSIS — G4733 Obstructive sleep apnea (adult) (pediatric): Secondary | ICD-10-CM | POA: Diagnosis present

## 2020-08-07 DIAGNOSIS — I132 Hypertensive heart and chronic kidney disease with heart failure and with stage 5 chronic kidney disease, or end stage renal disease: Secondary | ICD-10-CM | POA: Diagnosis present

## 2020-08-07 DIAGNOSIS — Z7901 Long term (current) use of anticoagulants: Secondary | ICD-10-CM

## 2020-08-07 DIAGNOSIS — N186 End stage renal disease: Secondary | ICD-10-CM

## 2020-08-07 DIAGNOSIS — C9000 Multiple myeloma not having achieved remission: Secondary | ICD-10-CM | POA: Diagnosis present

## 2020-08-07 DIAGNOSIS — R0602 Shortness of breath: Secondary | ICD-10-CM

## 2020-08-07 DIAGNOSIS — Z808 Family history of malignant neoplasm of other organs or systems: Secondary | ICD-10-CM

## 2020-08-07 DIAGNOSIS — N2581 Secondary hyperparathyroidism of renal origin: Secondary | ICD-10-CM | POA: Diagnosis present

## 2020-08-07 DIAGNOSIS — J129 Viral pneumonia, unspecified: Secondary | ICD-10-CM | POA: Diagnosis present

## 2020-08-07 DIAGNOSIS — Z8249 Family history of ischemic heart disease and other diseases of the circulatory system: Secondary | ICD-10-CM

## 2020-08-07 DIAGNOSIS — Z7982 Long term (current) use of aspirin: Secondary | ICD-10-CM

## 2020-08-07 DIAGNOSIS — E875 Hyperkalemia: Secondary | ICD-10-CM | POA: Diagnosis not present

## 2020-08-07 DIAGNOSIS — M069 Rheumatoid arthritis, unspecified: Secondary | ICD-10-CM | POA: Diagnosis present

## 2020-08-07 DIAGNOSIS — Z992 Dependence on renal dialysis: Secondary | ICD-10-CM

## 2020-08-07 DIAGNOSIS — J9611 Chronic respiratory failure with hypoxia: Secondary | ICD-10-CM | POA: Diagnosis present

## 2020-08-07 DIAGNOSIS — E785 Hyperlipidemia, unspecified: Secondary | ICD-10-CM | POA: Diagnosis present

## 2020-08-07 DIAGNOSIS — I4819 Other persistent atrial fibrillation: Secondary | ICD-10-CM | POA: Diagnosis present

## 2020-08-07 HISTORY — DX: COVID-19: U07.1

## 2020-08-07 HISTORY — DX: Pneumonia due to coronavirus disease 2019: J12.82

## 2020-08-07 LAB — CBC WITH DIFFERENTIAL/PLATELET
Abs Immature Granulocytes: 0.01 10*3/uL (ref 0.00–0.07)
Basophils Absolute: 0 10*3/uL (ref 0.0–0.1)
Basophils Relative: 0 %
Eosinophils Absolute: 0.2 10*3/uL (ref 0.0–0.5)
Eosinophils Relative: 6 %
HCT: 34.6 % — ABNORMAL LOW (ref 36.0–46.0)
Hemoglobin: 11.3 g/dL — ABNORMAL LOW (ref 12.0–15.0)
Immature Granulocytes: 0 %
Lymphocytes Relative: 34 %
Lymphs Abs: 1.2 10*3/uL (ref 0.7–4.0)
MCH: 31.5 pg (ref 26.0–34.0)
MCHC: 32.7 g/dL (ref 30.0–36.0)
MCV: 96.4 fL (ref 80.0–100.0)
Monocytes Absolute: 0.5 10*3/uL (ref 0.1–1.0)
Monocytes Relative: 13 %
Neutro Abs: 1.6 10*3/uL — ABNORMAL LOW (ref 1.7–7.7)
Neutrophils Relative %: 47 %
Platelets: 163 10*3/uL (ref 150–400)
RBC: 3.59 MIL/uL — ABNORMAL LOW (ref 3.87–5.11)
RDW: 15.4 % (ref 11.5–15.5)
WBC: 3.6 10*3/uL — ABNORMAL LOW (ref 4.0–10.5)
nRBC: 0 % (ref 0.0–0.2)

## 2020-08-07 LAB — BLOOD GAS, ARTERIAL
Acid-Base Excess: 5.6 mmol/L — ABNORMAL HIGH (ref 0.0–2.0)
Bicarbonate: 31.1 mmol/L — ABNORMAL HIGH (ref 20.0–28.0)
FIO2: 0.28
O2 Saturation: 97.7 %
Patient temperature: 37
pCO2 arterial: 49 mmHg — ABNORMAL HIGH (ref 32.0–48.0)
pH, Arterial: 7.41 (ref 7.350–7.450)
pO2, Arterial: 98 mmHg (ref 83.0–108.0)

## 2020-08-07 LAB — CBC
HCT: 34.7 % — ABNORMAL LOW (ref 36.0–46.0)
Hemoglobin: 10.9 g/dL — ABNORMAL LOW (ref 12.0–15.0)
MCH: 30.8 pg (ref 26.0–34.0)
MCHC: 31.4 g/dL (ref 30.0–36.0)
MCV: 98 fL (ref 80.0–100.0)
Platelets: 166 10*3/uL (ref 150–400)
RBC: 3.54 MIL/uL — ABNORMAL LOW (ref 3.87–5.11)
RDW: 15.2 % (ref 11.5–15.5)
WBC: 3.6 10*3/uL — ABNORMAL LOW (ref 4.0–10.5)
nRBC: 0 % (ref 0.0–0.2)

## 2020-08-07 LAB — BASIC METABOLIC PANEL
Anion gap: 16 — ABNORMAL HIGH (ref 5–15)
BUN: 49 mg/dL — ABNORMAL HIGH (ref 8–23)
CO2: 27 mmol/L (ref 22–32)
Calcium: 9.8 mg/dL (ref 8.9–10.3)
Chloride: 96 mmol/L — ABNORMAL LOW (ref 98–111)
Creatinine, Ser: 8.9 mg/dL — ABNORMAL HIGH (ref 0.44–1.00)
GFR, Estimated: 4 mL/min — ABNORMAL LOW (ref >60)
Glucose, Bld: 88 mg/dL (ref 70–99)
Potassium: 4.3 mmol/L (ref 3.5–5.1)
Sodium: 139 mmol/L (ref 135–145)

## 2020-08-07 LAB — POC SARS CORONAVIRUS 2 AG -  ED: SARS Coronavirus 2 Ag: POSITIVE — AB

## 2020-08-07 MED ORDER — ATORVASTATIN CALCIUM 20 MG PO TABS
80.0000 mg | ORAL_TABLET | Freq: Every day | ORAL | Status: DC
  Administered 2020-08-08 – 2020-08-17 (×10): 80 mg via ORAL
  Filled 2020-08-07 (×5): qty 4
  Filled 2020-08-07: qty 1
  Filled 2020-08-07: qty 4
  Filled 2020-08-07: qty 1
  Filled 2020-08-07: qty 4

## 2020-08-07 MED ORDER — SODIUM CHLORIDE 0.9% FLUSH
3.0000 mL | Freq: Two times a day (BID) | INTRAVENOUS | Status: DC
  Administered 2020-08-07 – 2020-08-17 (×19): 3 mL via INTRAVENOUS

## 2020-08-07 MED ORDER — ONDANSETRON HCL 4 MG PO TABS: 4.0000 mg | ORAL_TABLET | Freq: Four times a day (QID) | ORAL | Status: DC | PRN

## 2020-08-07 MED ORDER — CINACALCET HCL 30 MG PO TABS
60.0000 mg | ORAL_TABLET | Freq: Two times a day (BID) | ORAL | Status: DC
  Administered 2020-08-08 – 2020-08-17 (×15): 60 mg via ORAL
  Filled 2020-08-07 (×27): qty 2

## 2020-08-07 MED ORDER — ONDANSETRON HCL 4 MG/2ML IJ SOLN
4.0000 mg | Freq: Four times a day (QID) | INTRAMUSCULAR | Status: DC | PRN
  Administered 2020-08-10 – 2020-08-12 (×2): 4 mg via INTRAVENOUS
  Filled 2020-08-07 (×2): qty 2

## 2020-08-07 MED ORDER — GUAIFENESIN ER 600 MG PO TB12: 600.0000 mg | ORAL_TABLET | Freq: Two times a day (BID) | ORAL | Status: DC

## 2020-08-07 MED ORDER — RENA-VITE PO TABS
1.0000 | ORAL_TABLET | Freq: Every day | ORAL | Status: DC
  Administered 2020-08-07 – 2020-08-16 (×10): 1 via ORAL
  Filled 2020-08-07 (×12): qty 1

## 2020-08-07 MED ORDER — SODIUM CHLORIDE 0.9 % IV SOLN
200.0000 mg | Freq: Once | INTRAVENOUS | Status: AC
  Administered 2020-08-07: 200 mg via INTRAVENOUS
  Filled 2020-08-07: qty 200

## 2020-08-07 MED ORDER — GABAPENTIN 100 MG PO CAPS
100.0000 mg | ORAL_CAPSULE | Freq: Two times a day (BID) | ORAL | Status: DC
  Administered 2020-08-07 – 2020-08-17 (×19): 100 mg via ORAL
  Filled 2020-08-07 (×19): qty 1

## 2020-08-07 MED ORDER — MIDODRINE HCL 5 MG PO TABS
10.0000 mg | ORAL_TABLET | Freq: Three times a day (TID) | ORAL | Status: DC
  Administered 2020-08-08 – 2020-08-17 (×21): 10 mg via ORAL
  Filled 2020-08-07 (×28): qty 2

## 2020-08-07 MED ORDER — IPRATROPIUM-ALBUTEROL 20-100 MCG/ACT IN AERS
2.0000 | INHALATION_SPRAY | Freq: Four times a day (QID) | RESPIRATORY_TRACT | Status: DC | PRN
  Filled 2020-08-07: qty 4

## 2020-08-07 MED ORDER — TORSEMIDE 20 MG PO TABS
60.0000 mg | ORAL_TABLET | Freq: Every day | ORAL | Status: DC
  Administered 2020-08-08 – 2020-08-17 (×9): 60 mg via ORAL
  Filled 2020-08-07 (×9): qty 3

## 2020-08-07 MED ORDER — APIXABAN 2.5 MG PO TABS
2.5000 mg | ORAL_TABLET | Freq: Two times a day (BID) | ORAL | Status: DC
  Administered 2020-08-07 – 2020-08-17 (×20): 2.5 mg via ORAL
  Filled 2020-08-07 (×22): qty 1

## 2020-08-07 MED ORDER — SODIUM CHLORIDE 0.9 % IV BOLUS: 500.0000 mL | Freq: Once | INTRAVENOUS | Status: DC

## 2020-08-07 MED ORDER — ACETAMINOPHEN 650 MG RE SUPP: 650.0000 mg | Freq: Four times a day (QID) | RECTAL | Status: DC | PRN

## 2020-08-07 MED ORDER — SODIUM CHLORIDE 0.9 % IV SOLN
100.0000 mg | Freq: Every day | INTRAVENOUS | Status: DC
  Administered 2020-08-08 – 2020-08-09 (×2): 100 mg via INTRAVENOUS
  Filled 2020-08-07: qty 20
  Filled 2020-08-07: qty 100
  Filled 2020-08-07: qty 20
  Filled 2020-08-07: qty 100

## 2020-08-07 MED ORDER — ACETAMINOPHEN 325 MG PO TABS
650.0000 mg | ORAL_TABLET | Freq: Four times a day (QID) | ORAL | Status: DC | PRN
  Filled 2020-08-07: qty 2

## 2020-08-07 MED ORDER — SEVELAMER CARBONATE 800 MG PO TABS
800.0000 mg | ORAL_TABLET | Freq: Four times a day (QID) | ORAL | Status: DC
  Administered 2020-08-08 – 2020-08-09 (×4): 800 mg via ORAL
  Filled 2020-08-07 (×9): qty 1

## 2020-08-07 MED ORDER — GUAIFENESIN 100 MG/5ML PO SOLN
5.0000 mL | ORAL | Status: DC | PRN
  Administered 2020-08-10: 13:00:00 100 mg via ORAL
  Filled 2020-08-07: qty 20
  Filled 2020-08-07: qty 5

## 2020-08-07 MED ORDER — CARVEDILOL 25 MG PO TABS
25.0000 mg | ORAL_TABLET | Freq: Two times a day (BID) | ORAL | Status: DC
  Administered 2020-08-07 – 2020-08-17 (×18): 25 mg via ORAL
  Filled 2020-08-07 (×3): qty 1
  Filled 2020-08-07: qty 4
  Filled 2020-08-07 (×7): qty 1
  Filled 2020-08-07 (×2): qty 4
  Filled 2020-08-07 (×5): qty 1

## 2020-08-07 NOTE — H&P (Addendum)
History and Physical    Cassandra Joseph UTM:[id number] DOB: 11/15/1945 DOA: 08/07/2020  PCP: Tracie Harrier, MD  Patient coming from:  home I have personally briefly reviewed patient's old medical records in [place]  Chief Complaint: fatigue Cassandra Joseph    HPI: Cassandra Joseph is a 74 y.o. female with medical history significant of  ESRD on HD MWF, HTN, HLD,RA, TIA/CVA, Multiple Myeloma, OSA on cpap qhs, persistent atrial fibrillation, anticoagulated with Eliquis, chronic HFpEF on supplemental O2, mitral valve disease, who presents with 4-5 days of increasing fatigue ,cough with productive clear -yellow sputum, +fever with sweats as well as DOE. She presents to ed today due to progressive symptoms and in ability to complete her ADLS due to being so weak.  On further ros, patient also notes pleuritic chest pain, no diarrhea, no muscles aches but does have abnormal sense of taste and smell and also noted poor appetite. She is fully vaccinated , last shot few months back. On evaluation in ed patient found to be COVID + , cxr with noted infiltrates but no change from her baseline hypoxemia. In ed patient is actually stating 95% on 2L instead of prescribed 3 at home. Patient is admitted due to unsafe d/c due to associated weakness with viral syndrome and inability to take care of her self at home.  ED Course:  Vitals: afeb, bp 148/86, hr 66, rr 16, sat 100% on 4L Labs: wbc 3.6, hgb 10.9 at baseline, plt 166,  NA 139, K 4.3, CL 96, cr 8.9  FKC:LEXNTZG enlargement with perihilar and basilar infiltrates, likely edema but could represent multifocal pneumonia. POC :+ covid Abg:ph7.41/pco2 49, po2 98, sat 97 YFV:CBSW tx remdesivir Review of Systems: As per HPI otherwise 10 point review of systems negative.   Past Medical History:  Diagnosis Date  . 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 ESRD (end stage renal disease) ([place])    a. on HD x 11 years; b. felt to be 2/2 HTN; c. HD TTS  .  History of nuclear stress test    a. 08/2015: probably normal myocardial perfusion study, no evidence for significant ischemia or scar was noted, during stress, global systolic function normal, EF 54%, RV borderline dilatation, coronary artery calcifications and marked mitral annular calcifications were noted, liver calcifications were noted on attenuation CT scan likely represent sequelae of previous granulomatous disease  . HLD (hyperlipidemia)   . Hypertension   . Ischemic stroke ([place])    a. 03/2014  . Metabolic syndrome   . Morbid obesity ([place])   . Multiple myeloma ([facility])    a. in remission  . Osteomyelitis ([place])    a. 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 Rheumatoid arthritis ([place])   . TIA (transient ischemic attack)    a. 2014    Past Surgical History:  Procedure Laterality Date  . ABDOMINAL HYSTERECTOMY    . APPENDECTOMY    . eyelid surgery    . HERNIA REPAIR    . right knee surgery       reports that she has quit smoking. She has never used smokeless tobacco. She reports that she does not drink alcohol and does not use drugs.  Allergies  Allergen Reactions  . Shellfish Allergy Shortness Of Breath    Family History  Problem Relation Age of Onset  . Seizures Mother   . Hypertension Mother   . Throat cancer Father   . Breast cancer Neg Hx  Prior to Admission medications   Medication Sig Start Date End Date Taking? Authorizing Provider  apixaban (ELIQUIS) 2.5 MG TABS tablet Take 1 tablet (2.5 mg total) by mouth 2 (two) times daily. 06/28/19   Lavina Hamman, MD  aspirin EC 81 MG tablet Take 81 mg by mouth daily.    [provider]  atorvastatin (LIPITOR) 80 MG tablet Take 1 tablet (80 mg total) by mouth daily. 06/29/19   Lavina Hamman, MD  b complex-vitamin c-folic acid (NEPHRO-VITE) 0.8 MG TABS tablet Take 1 tablet by mouth daily. 09/27/16   [provider]  carvedilol (COREG) 25 MG tablet Take 25 mg by mouth 2 (two) times daily. 06/26/13    [provider]  cinacalcet (SENSIPAR) 60 MG tablet Take 60 mg by mouth 2 (two) times daily.    [provider]  gabapentin (NEURONTIN) 100 MG capsule Take 100 mg by mouth 2 (two) times daily.    [provider]  midodrine (PROAMATINE) 10 MG tablet Take 1 tablet (10 mg total) by mouth 3 (three) times daily with meals. 06/28/19   Lavina Hamman, MD  sevelamer carbonate (RENVELA) 800 MG tablet Take 800 mg by mouth 4 (four) times daily.    [provider]  torsemide (DEMADEX) 20 MG tablet Take 3 tablets (60 mg total) by mouth daily. 06/29/19   Lavina Hamman, MD    Physical Exam: Vitals:   08/07/20 1930 08/07/20 2000 08/07/20 2004 08/07/20 2010  BP:  (!) 171/96 (!) 171/96   Pulse: 65 70 67   Resp: 12 (!) 25 (!) 22   Temp:    98.2 F (36.8 C)  TempSrc:    Oral  SpO2: 98% 97% 98%   Weight:        Constitutional: NAD, calm, comfortable Vitals:   08/07/20 1930 08/07/20 2000 08/07/20 2004 08/07/20 2010  BP:  (!) 171/96 (!) 171/96   Pulse: 65 70 67   Resp: 12 (!) 25 (!) 22   Temp:    98.2 F (36.8 C)  TempSrc:    Oral  SpO2: 98% 97% 98%   Weight:       Eyes: PERRL, lids and conjunctivae normal ENMT: Mucous membranes are moist. Posterior pharynx clear of any exudate or lesions.Normal dentition.  Neck: normal, supple, no masses, no thyromegaly Respiratory: clear to auscultation bilaterally, no wheezing, no crackles. Normal respiratory effort. No accessory muscle use.  Cardiovascular: Regular rate and rhythm, no murmurs / rubs / gallops. No extremity edema. 2+ pedal pulses. No carotid bruits.  Abdomen: no tenderness, no masses palpated. No hepatosplenomegaly. Bowel sounds positive.  Musculoskeletal: no clubbing / cyanosis. No joint deformity upper and lower extremities. Good ROM, no contractures. Normal muscle tone.  Skin: no rashes, lesions, ulcers. No induration Neurologic: CN 2-12 grossly intact. Sensation intact,. Strength 5/5 in all 4.   Psychiatric: Normal judgment and insight. Alert and oriented x 3. Normal mood.    Labs on Admission: I have personally reviewed following labs and imaging studies  CBC: Recent Labs  Lab 08/07/20 1737  WBC 3.6*  3.6*  NEUTROABS 1.6*  HGB 11.3*  10.9*  HCT 34.6*  34.7*  MCV 96.4  98.0  PLT 163  539   Basic Metabolic Panel: Recent Labs  Lab 08/07/20 1737  NA 139  K 4.3  CL 96*  CO2 27  GLUCOSE 88  BUN 49*  CREATININE 8.90*  CALCIUM 9.8   GFR: CrCl cannot be calculated (Unknown ideal weight.). Liver  Function Tests: No results for input(s): AST, ALT, ALKPHOS, BILITOT, PROT, ALBUMIN in the last 168 hours. No results for input(s): LIPASE, AMYLASE in the last 168 hours. No results for input(s): AMMONIA in the last 168 hours. Coagulation Profile: No results for input(s): INR, PROTIME in the last 168 hours. Cardiac Enzymes: No results for input(s): CKTOTAL, CKMB, CKMBINDEX, TROPONINI in the last 168 hours. BNP (last 3 results) No results for input(s): PROBNP in the last 8760 hours. HbA1C: No results for input(s): HGBA1C in the last 72 hours. CBG: No results for input(s): GLUCAP in the last 168 hours. Lipid Profile: No results for input(s): CHOL, HDL, LDLCALC, TRIG, CHOLHDL, LDLDIRECT in the last 72 hours. Thyroid Function Tests: No results for input(s): TSH, T4TOTAL, FREET4, T3FREE, THYROIDAB in the last 72 hours. Anemia Panel: No results for input(s): VITAMINB12, FOLATE, FERRITIN, TIBC, IRON, RETICCTPCT in the last 72 hours. Urine analysis: No results found for: COLORURINE, APPEARANCEUR, LABSPEC, [place], GLUCOSEU, [place], BILIRUBINUR, KETONESUR, PROTEINUR, UROBILINOGEN, NITRITE, LEUKOCYTESUR  Radiological Exams on Admission: DG Chest Port 1 View  Result Date: 08/07/2020 CLINICAL DATA:  Shortness of breath.  COVID positive. EXAM: PORTABLE CHEST 1 VIEW COMPARISON:  06/23/2019 FINDINGS: Cardiac enlargement. Perihilar and basilar infiltrates probably due to edema  but could represent multifocal pneumonia. Elevation of the right hemidiaphragm with linear atelectasis in the right lung base. Possible small right pleural effusion. Similar appearance to previous study. IMPRESSION: Cardiac enlargement with perihilar and basilar infiltrates, likely edema but could represent multifocal pneumonia. Electronically Signed   By: Lucienne Capers M.D.   On: 08/07/2020 18:11    EKG: Independently reviewed.  See above Assessment/Plan COVID Pneumonia  -mild/mod disease  - patient does not meet criteria for inpatient treatment , however is an unsafe discharge due to generalized weakness without any assistance at home. - she does however meet criteria for out patient treatment course with remdesivir x 3 days.  She may also benefit from referral to infusion clinic as out patient for monoclonal antibody  -supportive care anti-tussive/pulmonary toilet/ combivent prn -monitor on tele and continuous pulse ox  - if note increase O2 need would start steroids  - will also check disease severity labs to be complete    ESRD  -on HD MWF -resume renal meds -if patient is still admitted for Monday will need renal consult for HD   CHF, Pef -on chronic O2 at 3L  -continue carvedilol and demadex  HLD -continue statin   RA -no active issues   Hx of Afib  -cont eliquis,bb   TIA/CVA -on secondary ppx with Eliquis   Multiple Myeloma -no active issues currently     DVT prophylaxis: heparin  Code Status: Full Family Communication: n/a Disposition Plan: patient  expected to be admitted less than 2 midnights Consults called: PT/OT  Admission status: obs   Clance Boll MD [facility]  If 7PM-7AM, please contact night-coverage [email or web address] Password [email or web address]  08/07/2020, 8:21 PM

## 2020-08-07 NOTE — ED Notes (Signed)
Continue to wait for 2200 eliquis and mvi from pharmacy.

## 2020-08-07 NOTE — ED Provider Notes (Signed)
[facility] Emergency Department Provider Note  ____________________________________________  Time seen: Approximately 8:02 PM  I have reviewed the triage vital signs and the nursing notes.   HISTORY  Chief Complaint Shortness of Breath    HPI Cassandra Joseph is a 74 y.o. female with a history of ESRD on hemodialysis, morbid obesity, multiple myeloma, hypertension who comes ED complaining of worsening shortness of breath and generalized weakness for the past 4 days. She has had loss of appetite and poor oral intake as well. She took a home Covid test 2 days ago which was positive.  Normally she can walk across the house with her walker, but currently she is too weak to get out of bed. The most she can do is sit up on the side of the bed momentarily. She is on 3 L nasal cannula chronically, but feels more short of breath currently. Denies chest pain.   Symptoms are constant, severe, no alleviating factors.  Review of electronic medical record does show that she takes torsemide in addition to being dialysis dependent. She has continued taking this over the last few days despite not eating and drinking very well.  Past Medical History:  Diagnosis Date  . 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 ESRD (end stage renal disease) ([place])    a. on HD x 11 years; b. felt to be 2/2 HTN; c. HD TTS  . History of nuclear stress test    a. 08/2015: probably normal myocardial perfusion study, no evidence for significant ischemia or scar was noted, during stress, global systolic function normal, EF 54%, RV borderline dilatation, coronary artery calcifications and marked mitral annular calcifications were noted, liver calcifications were noted on attenuation CT scan likely represent sequelae of previous granulomatous disease  . HLD (hyperlipidemia)   . Hypertension   . Ischemic stroke ([place])    a. 03/2014  . Metabolic syndrome   . Morbid obesity ([place])   . Multiple myeloma  (HCC)    a. in remission  . Osteomyelitis ([place])    a. 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 Rheumatoid arthritis ([place])   . TIA (transient ischemic attack)    a. 2014     Patient Active Problem List   Diagnosis Date Noted  . History of anemia due to chronic kidney disease   . ESRD (end stage renal disease) ([place])   . SOB (shortness of breath)   . Acute pulmonary edema ([place]) 06/23/2019  . Acute on chronic diastolic CHF (congestive heart failure) ([place]) 01/18/2017  . HLD (hyperlipidemia) 01/18/2017  . Atypical chest pain 11/09/2016  . ESRD on hemodialysis ([place]) 11/09/2016  . Morbid obesity ([place]) 11/09/2016  . Essential hypertension 11/09/2016  . Multiple myeloma ([place]) 11/09/2016  . Elevated troponin 11/09/2016  . Metabolic syndrome 78/93/8101     Past Surgical History:  Procedure Laterality Date  . ABDOMINAL HYSTERECTOMY    . APPENDECTOMY    . eyelid surgery    . HERNIA REPAIR    . right knee surgery       Prior to Admission medications   Medication Sig Start Date End Date Taking? Authorizing Provider  apixaban (ELIQUIS) 2.5 MG TABS tablet Take 1 tablet (2.5 mg total) by mouth 2 (two) times daily. 06/28/19   Lavina Hamman, MD  aspirin EC 81 MG tablet Take 81 mg by mouth daily.    [provider]  atorvastatin (LIPITOR) 80 MG tablet Take 1 tablet (80 mg total)  by mouth daily. 06/29/19   Lavina Hamman, MD  b complex-vitamin c-folic acid (NEPHRO-VITE) 0.8 MG TABS tablet Take 1 tablet by mouth daily. 09/27/16   [provider]  carvedilol (COREG) 25 MG tablet Take 25 mg by mouth 2 (two) times daily. 06/26/13   [provider]  cinacalcet (SENSIPAR) 60 MG tablet Take 60 mg by mouth 2 (two) times daily.    [provider]  gabapentin (NEURONTIN) 100 MG capsule Take 100 mg by mouth 2 (two) times daily.    [provider]  midodrine (PROAMATINE) 10 MG tablet Take 1 tablet (10 mg total) by mouth 3 (three) times daily with meals.  06/28/19   Lavina Hamman, MD  sevelamer carbonate (RENVELA) 800 MG tablet Take 800 mg by mouth 4 (four) times daily.    [provider]  torsemide (DEMADEX) 20 MG tablet Take 3 tablets (60 mg total) by mouth daily. 06/29/19   Lavina Hamman, MD     Allergies Shellfish allergy   Family History  Problem Relation Age of Onset  . Seizures Mother   . Hypertension Mother   . Throat cancer Father   . Breast cancer Neg Hx     Social History Social History   Tobacco Use  . Smoking status: Former Research scientist (life sciences)  . Smokeless tobacco: Never Used  . Tobacco comment: quit in 2007  Substance Use Topics  . Alcohol use: No  . Drug use: No    Review of Systems  Constitutional:   No fever or chills.  ENT:   No sore throat. No rhinorrhea. Cardiovascular:   No chest pain or syncope. Respiratory: Positive shortness of breath and productive cough. Gastrointestinal:   Negative for abdominal pain, vomiting and diarrhea.  Musculoskeletal:   Negative for focal pain or swelling All other systems reviewed and are negative except as documented above in ROS and HPI.  ____________________________________________   PHYSICAL EXAM:  VITAL SIGNS: ED Triage Vitals  Enc Vitals Group     BP 08/07/20 1721 (!) 148/86     Pulse Rate 08/07/20 1721 66     Resp 08/07/20 1721 16     Temp --      Temp src --      SpO2 08/07/20 1721 100 %     Weight 08/07/20 1719 265 lb (120.2 kg)     Height --      Head Circumference --      Peak Flow --      Pain Score 08/07/20 1719 0     Pain Loc --      Pain Edu? --      Excl. in [place]? --     Vital signs reviewed, nursing assessments reviewed.   Constitutional:   Alert and oriented. Ill-appearing Eyes:   Conjunctivae are normal. EOMI. PERRL. ENT      Head:   Normocephalic and atraumatic.      Nose: Normal      Mouth/Throat: Dry mucous membranes.      Neck:   No meningismus. Full ROM. Hematological/Lymphatic/Immunilogical:   No cervical  lymphadenopathy. Cardiovascular:   RRR. Symmetric bilateral radial and DP pulses.  No murmurs. Cap refill less than 2 seconds. Respiratory:   Normal respiratory effort without tachypnea/retractions. Diminished breath sounds in the right base. Gastrointestinal:   Soft and nontender. Non distended. There is no CVA tenderness.  No rebound, rigidity, or guarding.  Musculoskeletal:   Normal range of motion in all extremities. No joint effusions.  No lower extremity tenderness.  No edema. Neurologic:   Normal speech and language.  Motor grossly intact. No acute focal neurologic deficits are appreciated.  Skin:    Skin is warm, dry and intact. No rash noted.  No petechiae, purpura, or bullae.  ____________________________________________    LABS (pertinent positives/negatives) (all labs ordered are listed, but only abnormal results are displayed) Labs Reviewed  BASIC METABOLIC PANEL - Abnormal; Notable for the following components:      Result Value   Chloride 96 (*)    BUN 49 (*)    Creatinine, Ser 8.90 (*)    GFR, Estimated 4 (*)    Anion gap 16 (*)    All other components within normal limits  CBC - Abnormal; Notable for the following components:   WBC 3.6 (*)    RBC 3.54 (*)    Hemoglobin 10.9 (*)    HCT 34.7 (*)    All other components within normal limits  CBC WITH DIFFERENTIAL/PLATELET - Abnormal; Notable for the following components:   WBC 3.6 (*)    RBC 3.59 (*)    Hemoglobin 11.3 (*)    HCT 34.6 (*)    Neutro Abs 1.6 (*)    All other components within normal limits  POC SARS CORONAVIRUS 2 AG -  ED - Abnormal; Notable for the following components:   SARS Coronavirus 2 Ag POSITIVE (*)    All other components within normal limits  BLOOD GAS, ARTERIAL   ____________________________________________   EKG  Interpreted by me Atrial fibrillation rate of 64, normal axis and intervals. Normal QRS ST segments and T waves.  ____________________________________________     RADIOLOGY  DG Chest Port 1 View  Result Date: 08/07/2020 CLINICAL DATA:  Shortness of breath.  COVID positive. EXAM: PORTABLE CHEST 1 VIEW COMPARISON:  06/23/2019 FINDINGS: Cardiac enlargement. Perihilar and basilar infiltrates probably due to edema but could represent multifocal pneumonia. Elevation of the right hemidiaphragm with linear atelectasis in the right lung base. Possible small right pleural effusion. Similar appearance to previous study. IMPRESSION: Cardiac enlargement with perihilar and basilar infiltrates, likely edema but could represent multifocal pneumonia. Electronically Signed   By: Lucienne Capers M.D.   On: 08/07/2020 18:11    ____________________________________________   PROCEDURES Procedures  ____________________________________________  DIFFERENTIAL DIAGNOSIS   Pneumonia, pulmonary edema, pleural effusion, COVID-19 infection, electrolyte abnormality, dehydration  CLINICAL IMPRESSION / ASSESSMENT AND PLAN / ED COURSE  Medications ordered in the ED: Medications  remdesivir 200 mg in sodium chloride 0.9% 250 mL IVPB (200 mg Intravenous New Bag/Given 08/07/20 2003)    Followed by  remdesivir 100 mg in sodium chloride 0.9 % 100 mL IVPB (has no administration in time range)    Pertinent labs & imaging results that were available during my care of the patient were reviewed by me and considered in my medical decision making (see chart for details).  Cassandra Joseph was evaluated in Emergency Department on 08/07/2020 for the symptoms described in the history of present illness. She was evaluated in the context of the global COVID-19 pandemic, which necessitated consideration that the patient might be at risk for infection with the SARS-CoV-2 virus that causes COVID-19. Institutional protocols and algorithms that pertain to the evaluation of patients at risk for COVID-19 are in a state of rapid change based on information released by regulatory bodies including the  CDC and federal and state organizations. These policies and algorithms were followed during the patient's care in the ED.  Patient presents with generalized weakness and worsening shortness of breath with chronic respiratory failure ESRD and positive home Covid test. Chest x-ray image viewed and interpreted by me which does show increased opacities in the lower lobes. Radiology report agrees, suspicious for Covid pneumonia. Labs consistent with ESRD without other acute findings. She is not acutely hypoxic beyond her baseline level of respiratory failure, but with her weakness and inability to perform ADLs in the setting of this acute illness and severe comorbidities, she will need to be hospitalized, started on IV remdesivir, to ensure improvement.  Case discussed with hospitalist.      ____________________________________________   FINAL CLINICAL IMPRESSION(S) / ED DIAGNOSES    Final diagnoses:  FPOIP-18 virus infection  Generalized weakness  ESRD on hemodialysis ([place])  Morbid obesity ([place])  Chronic respiratory failure with hypoxia [facility])     ED Discharge Orders    None      Portions of this note were generated with dragon dictation software. Dictation errors may occur despite best attempts at proofreading.   Carrie Mew, MD 08/07/20 2010

## 2020-08-07 NOTE — ED Notes (Signed)
Pt resting in bed, head of bed elevated, resps unlabored. Oxygen in place at 2lpm. Call bell on right side.

## 2020-08-07 NOTE — ED Notes (Signed)
Bed adjusted again for comfort. Pt denies other needs.

## 2020-08-07 NOTE — Progress Notes (Signed)
Remdesivir - Pharmacy Brief Note   O:  CXR: Cardiac enlargement with perihilar and basilar infiltrates, likely edema but could represent multifocal pneumonia. SpO2: 98% on 2 L [place]   A/P:  Remdesivir 200 mg IVPB once followed by 100 mg IVPB daily x 4 days.   Dorena Bodo, PharmD 08/07/2020 7:08 PM

## 2020-08-07 NOTE — ED Triage Notes (Signed)
Pt wears 3L home 02 per EMS. Presented on 4L at 100% saturation. Pt turned down to 3L 02.

## 2020-08-07 NOTE — ED Triage Notes (Signed)
Pt presents via EMS from home c/o SOB. Pt Covid+ per EMS. Hx CHF and CKD.

## 2020-08-07 NOTE — ED Notes (Signed)
Waiting on rena vit and eliquis from pharmacy.

## 2020-08-07 NOTE — ED Notes (Signed)
Bed adjusted for comfort.  

## 2020-08-08 DIAGNOSIS — J129 Viral pneumonia, unspecified: Secondary | ICD-10-CM | POA: Diagnosis present

## 2020-08-08 DIAGNOSIS — I5032 Chronic diastolic (congestive) heart failure: Secondary | ICD-10-CM | POA: Diagnosis present

## 2020-08-08 DIAGNOSIS — I132 Hypertensive heart and chronic kidney disease with heart failure and with stage 5 chronic kidney disease, or end stage renal disease: Secondary | ICD-10-CM | POA: Diagnosis present

## 2020-08-08 DIAGNOSIS — C9000 Multiple myeloma not having achieved remission: Secondary | ICD-10-CM | POA: Diagnosis present

## 2020-08-08 DIAGNOSIS — Z992 Dependence on renal dialysis: Secondary | ICD-10-CM | POA: Diagnosis not present

## 2020-08-08 DIAGNOSIS — Z6841 Body Mass Index (BMI) 40.0 and over, adult: Secondary | ICD-10-CM | POA: Diagnosis not present

## 2020-08-08 DIAGNOSIS — Z87891 Personal history of nicotine dependence: Secondary | ICD-10-CM | POA: Diagnosis not present

## 2020-08-08 DIAGNOSIS — N2581 Secondary hyperparathyroidism of renal origin: Secondary | ICD-10-CM | POA: Diagnosis present

## 2020-08-08 DIAGNOSIS — Z808 Family history of malignant neoplasm of other organs or systems: Secondary | ICD-10-CM | POA: Diagnosis not present

## 2020-08-08 DIAGNOSIS — R531 Weakness: Secondary | ICD-10-CM | POA: Diagnosis not present

## 2020-08-08 DIAGNOSIS — N186 End stage renal disease: Secondary | ICD-10-CM | POA: Diagnosis present

## 2020-08-08 DIAGNOSIS — I4819 Other persistent atrial fibrillation: Secondary | ICD-10-CM | POA: Diagnosis present

## 2020-08-08 DIAGNOSIS — Z90710 Acquired absence of both cervix and uterus: Secondary | ICD-10-CM | POA: Diagnosis not present

## 2020-08-08 DIAGNOSIS — Z8673 Personal history of transient ischemic attack (TIA), and cerebral infarction without residual deficits: Secondary | ICD-10-CM | POA: Diagnosis not present

## 2020-08-08 DIAGNOSIS — D631 Anemia in chronic kidney disease: Secondary | ICD-10-CM | POA: Diagnosis present

## 2020-08-08 DIAGNOSIS — Z8249 Family history of ischemic heart disease and other diseases of the circulatory system: Secondary | ICD-10-CM | POA: Diagnosis not present

## 2020-08-08 DIAGNOSIS — Z7901 Long term (current) use of anticoagulants: Secondary | ICD-10-CM | POA: Diagnosis not present

## 2020-08-08 DIAGNOSIS — J1282 Pneumonia due to coronavirus disease 2019: Secondary | ICD-10-CM | POA: Diagnosis present

## 2020-08-08 DIAGNOSIS — D72819 Decreased white blood cell count, unspecified: Secondary | ICD-10-CM | POA: Diagnosis present

## 2020-08-08 DIAGNOSIS — G4733 Obstructive sleep apnea (adult) (pediatric): Secondary | ICD-10-CM | POA: Diagnosis present

## 2020-08-08 DIAGNOSIS — U071 COVID-19: Secondary | ICD-10-CM | POA: Diagnosis present

## 2020-08-08 DIAGNOSIS — J9611 Chronic respiratory failure with hypoxia: Secondary | ICD-10-CM | POA: Diagnosis present

## 2020-08-08 DIAGNOSIS — M069 Rheumatoid arthritis, unspecified: Secondary | ICD-10-CM | POA: Diagnosis present

## 2020-08-08 DIAGNOSIS — Z91013 Allergy to seafood: Secondary | ICD-10-CM | POA: Diagnosis not present

## 2020-08-08 DIAGNOSIS — E785 Hyperlipidemia, unspecified: Secondary | ICD-10-CM | POA: Diagnosis present

## 2020-08-08 HISTORY — DX: Viral pneumonia, unspecified: J12.9

## 2020-08-08 LAB — CBC
HCT: 33.9 % — ABNORMAL LOW (ref 36.0–46.0)
Hemoglobin: 10.5 g/dL — ABNORMAL LOW (ref 12.0–15.0)
MCH: 30.5 pg (ref 26.0–34.0)
MCHC: 31 g/dL (ref 30.0–36.0)
MCV: 98.5 fL (ref 80.0–100.0)
Platelets: 152 10*3/uL (ref 150–400)
RBC: 3.44 MIL/uL — ABNORMAL LOW (ref 3.87–5.11)
RDW: 15.5 % (ref 11.5–15.5)
WBC: 3.1 10*3/uL — ABNORMAL LOW (ref 4.0–10.5)
nRBC: 0 % (ref 0.0–0.2)

## 2020-08-08 LAB — COMPREHENSIVE METABOLIC PANEL
ALT: 10 U/L (ref 0–44)
AST: 15 U/L (ref 15–41)
Albumin: 3 g/dL — ABNORMAL LOW (ref 3.5–5.0)
Alkaline Phosphatase: 60 U/L (ref 38–126)
Anion gap: 16 — ABNORMAL HIGH (ref 5–15)
BUN: 51 mg/dL — ABNORMAL HIGH (ref 8–23)
CO2: 27 mmol/L (ref 22–32)
Calcium: 9.6 mg/dL (ref 8.9–10.3)
Chloride: 97 mmol/L — ABNORMAL LOW (ref 98–111)
Creatinine, Ser: 9.29 mg/dL — ABNORMAL HIGH (ref 0.44–1.00)
GFR, Estimated: 4 mL/min — ABNORMAL LOW (ref >60)
Glucose, Bld: 82 mg/dL (ref 70–99)
Potassium: 4.3 mmol/L (ref 3.5–5.1)
Sodium: 140 mmol/L (ref 135–145)
Total Bilirubin: 0.9 mg/dL (ref 0.3–1.2)
Total Protein: 7.4 g/dL (ref 6.5–8.1)

## 2020-08-08 LAB — HEMOGLOBIN A1C
Hgb A1c MFr Bld: 5.2 % (ref 4.8–5.6)
Mean Plasma Glucose: 102.54 mg/dL

## 2020-08-08 LAB — FERRITIN: Ferritin: 1152 ng/mL — ABNORMAL HIGH (ref 11–307)

## 2020-08-08 LAB — FIBRIN DERIVATIVES D-DIMER (ARMC ONLY): Fibrin derivatives D-dimer (ARMC): 934.99 ng/mL (FEU) — ABNORMAL HIGH (ref 0.00–499.00)

## 2020-08-08 LAB — TSH: TSH: 0.547 u[IU]/mL (ref 0.350–4.500)

## 2020-08-08 LAB — C-REACTIVE PROTEIN: CRP: 11 mg/dL — ABNORMAL HIGH (ref <1.0)

## 2020-08-08 MED ORDER — LOPERAMIDE HCL 2 MG PO CAPS
4.0000 mg | ORAL_CAPSULE | Freq: Once | ORAL | Status: AC
  Administered 2020-08-08: 4 mg via ORAL
  Filled 2020-08-08: qty 2

## 2020-08-08 MED ORDER — CHLORHEXIDINE GLUCONATE CLOTH 2 % EX PADS
6.0000 | MEDICATED_PAD | Freq: Every day | CUTANEOUS | Status: DC
  Administered 2020-08-10 – 2020-08-15 (×5): 6 via TOPICAL
  Filled 2020-08-08 (×2): qty 6

## 2020-08-08 NOTE — ED Notes (Signed)
Patient resting on her side, looks uncomfortable but she says "I'm comfortable like this"    Given warm blankets

## 2020-08-08 NOTE — ED Notes (Signed)
Pt taken to dialysis 

## 2020-08-08 NOTE — ED Notes (Signed)
Messaged MD about pt getting dialysis today, no consult in for nephrology. Last dialysis Wed, usually MWF. Called dialysis, and they are reaching out to Dr. Holley Raring

## 2020-08-08 NOTE — ED Notes (Addendum)
Pt resting. Call bell at right side. Bed in low and locked position. Report to donna, rn.

## 2020-08-08 NOTE — Progress Notes (Signed)
OT Cancellation Note  Patient Details Name: KAIRAH LEONI MRN: [id number] DOB: 1946-03-20   Cancelled Treatment:    Reason Eval/Treat Not Completed: Patient at procedure or test/ unavailable. OT order received and chart reviewed. Upon arrival to room transport arrived for HD. Will follow up at later date/time as able to initiate services.   Dessie Coma, M.S. OTR/L  08/08/20, 3:31 PM  ascom [phone]

## 2020-08-08 NOTE — ED Notes (Signed)
Message sent to pharmacy for missing meds 

## 2020-08-08 NOTE — Progress Notes (Signed)
Patient cannulated per Phebe Colla, RN

## 2020-08-08 NOTE — ED Notes (Signed)
Report from [place], rn.

## 2020-08-08 NOTE — Evaluation (Signed)
Physical Therapy Evaluation Patient Details Name: Cassandra Joseph MRN: [id number] DOB: [birth year] Today's Date: 08/08/2020   History of Present Illness  Pt admitted for pneumonia secondary to covid with complaints of SOB symptoms. HIstory includes CHF, CKD, chronic O2 use 3L at home, ESRD with HD on MWF, and multiple myeloma.  Clinical Impression  Pt is a pleasant 75 year old female who was admitted for pneumonia secondary to covid. Pt performs bed mobility with mod assist, transfers with min assist, and ambulation with cga and RW. Pt demonstrates deficits with strength/mobility/endurance. All mobility performed on 3L of O2, with slight decrease in O2 sats with exertion to 88%. Is currently not at baseline level at this time, awaiting for HD treatment this date. Would benefit from skilled PT to address above deficits and promote optimal return to PLOF; recommend transition to STR upon discharge from acute hospitalization.     Follow Up Recommendations SNF    Equipment Recommendations  None recommended by PT    Recommendations for Other Services       Precautions / Restrictions Precautions Precautions: Fall Restrictions Weight Bearing Restrictions: No      Mobility  Bed Mobility Overal bed mobility: Needs Assistance Bed Mobility: Supine to Sit     Supine to sit: Mod assist     General bed mobility comments: needs assist for trunk support. Once seated at EOB, min assist for scooting out towards EOB.    Transfers Overall transfer level: Needs assistance Equipment used: Rolling walker (2 wheeled) Transfers: Sit to/from Stand Sit to Stand: Min assist         General transfer comment: rocking momentum used prior to standing. Once standing, hinges at hips initially and then able to fully stand.  Ambulation/Gait Ambulation/Gait assistance: Min guard Gait Distance (Feet): 5 Feet Assistive device: Rolling walker (2 wheeled) Gait Pattern/deviations: Step-to pattern      General Gait Details: ambulated with side steps up towards [place]. Very effortful and takes extended time. O2 sats decrease on 3L of O2 to 88%. Quickly improve to 91% once seated  Stairs            Wheelchair Mobility    Modified Rankin (Stroke Patients Only)       Balance Overall balance assessment: Needs assistance;History of Falls Sitting-balance support: Bilateral upper extremity supported Sitting balance-Leahy Scale: Good     Standing balance support: Bilateral upper extremity supported Standing balance-Leahy Scale: Fair                               Pertinent Vitals/Pain Pain Assessment: No/denies pain    Home Living Family/patient expects to be discharged to:: Private residence Living Arrangements: Spouse/significant other (spouse currently on hospice) Available Help at Discharge: Family;Available PRN/intermittently Type of Home: House Home Access: Ramped entrance     Home Layout: One level Home Equipment: Walker - 2 wheels;Wheelchair - manual;Bedside commode Additional Comments: Family assist with bathing, meal prep, and chores.    Prior Function Level of Independence: Needs assistance   Gait / Transfers Assistance Needed: doesn't drive     Comments: Pt only ambulates short distances with RW at baseline. Uses WC for long distances. Takes bus to HD. Reports falls at home     Hand Dominance        Extremity/Trunk Assessment   Upper Extremity Assessment Upper Extremity Assessment: Generalized weakness (R UE contracture with limited use of hand, grossly 3/5; L UE grossly 4/5)  Lower Extremity Assessment Lower Extremity Assessment: Generalized weakness (B LE grossly 3+/5)       Communication   Communication: No difficulties  Cognition Arousal/Alertness: Awake/alert Behavior During Therapy: WFL for tasks assessed/performed Overall Cognitive Status: Within Functional Limits for tasks assessed                                         General Comments      Exercises Other Exercises Other Exercises: supine ther-ex performed on B LE including AP, SAQ, and hip abd/add. All ther-ex performed x 10 reps with mod assist.   Assessment/Plan    PT Assessment Patient needs continued PT services  PT Problem List Decreased strength;Decreased activity tolerance;Decreased balance;Decreased mobility;Obesity;Cardiopulmonary status limiting activity       PT Treatment Interventions Gait training;Therapeutic exercise;Balance training    PT Goals (Current goals can be found in the Care Plan section)  Acute Rehab PT Goals Patient Stated Goal: to get stronger PT Goal Formulation: With patient Time For Goal Achievement: 08/22/20 Potential to Achieve Goals: Good    Frequency Min 2X/week   Barriers to discharge        Co-evaluation               AM-PAC PT "6 Clicks" Mobility  Outcome Measure Help needed turning from your back to your side while in a flat bed without using bedrails?: A Little Help needed moving from lying on your back to sitting on the side of a flat bed without using bedrails?: A Little Help needed moving to and from a bed to a chair (including a wheelchair)?: A Lot Help needed standing up from a chair using your arms (e.g., wheelchair or bedside chair)?: A Lot Help needed to walk in hospital room?: A Lot Help needed climbing 3-5 steps with a railing? : Total 6 Click Score: 13    End of Session Equipment Utilized During Treatment: Oxygen Activity Tolerance: Patient limited by fatigue Patient left: in bed Nurse Communication: Mobility status PT Visit Diagnosis: Muscle weakness (generalized) (M62.81);History of falling (Z91.81);Difficulty in walking, not elsewhere classified (R26.2)    Time: 1441-1510 PT Time Calculation (min) (ACUTE ONLY): 29 min   Charges:   PT Evaluation $PT Eval Low Complexity: 1 Low PT Treatments $Therapeutic Exercise: 8-22 mins        Greggory Stallion,  PT, DPT [phone]   [place]te Thai 08/08/2020, 4:41 PM

## 2020-08-08 NOTE — ED Notes (Signed)
Asked MD for anti-diarrheal med orders as pt is having diarrhea now

## 2020-08-08 NOTE — ED Notes (Signed)
Went in to assess pt, as 2 consecutive low bp readings on monitor. Pt's bp cuff had slid down arm, and she was uncomfortable in bed. Helped with repositioning, and pt got exerted with this - sats went to 86%. Pt advised to rest, and O2 bumped to [facility], sats coming back up.

## 2020-08-08 NOTE — ED Notes (Signed)
Pt in dialysis at this time 

## 2020-08-08 NOTE — ED Notes (Signed)
Pt helped up in bed with one assist. Pt given clean gown, bed pads and blankets. Pt states she feels more comfortable

## 2020-08-08 NOTE — Progress Notes (Signed)
PROGRESS NOTE    Cassandra Joseph  [id number] DOB: 27-May-1946 DOA: 08/07/2020 PCP: Tracie Harrier, MD   Assessment & Plan:   Active Problems:   Pneumonia due to COVID-19 virus   COVID Pneumonia: continue on airborne & contact precautions. Continue on IV remdesivir and bronchodilators. Vaccinated but not boostered. Encourage incentive spirometry   Leukopenia: likely secondary to [place]. 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 Missed HD yesterday, so will have HD today as per nephro. Nephro recs apprec.   Chronic diastolic CHF: continue on home dose of carvedilol, torsemide  Chronic hypoxic respiratory failure: chronically on 3L [place].   HLD: continue on statin   RA: not on any meds for RA   Likely PAF: continue on home dose of carvedilol, eliquis   Hx of TIA/CVA: continue on statin   Multiple myeloma: management per oncology as an outpatient   Morbid obesity: BMI is 42.7. Complicates overall care and prognosis    DVT prophylaxis: eliquis  Code Status: full  Family Communication: Disposition Plan: depends on PT/OT recs (not consulted yet)   Status is: Inpatient  Remains inpatient appropriate because:Unsafe d/c plan and IV treatments appropriate due to intensity of illness or inability to take PO   Dispo: The patient is from: Home              Anticipated d/c is to: Home              Anticipated d/c date is: 3 days              Patient currently is not medically stable to d/c.           Consultants:   nephro    Procedures:  Antimicrobials:   Subjective: Pt c/o increased mucus production   Objective: Vitals:   08/08/20 0100 08/08/20 0125 08/08/20 0200 08/08/20 0350  BP: (!) 147/74 135/73 (!) 107/46 (!) 163/70  Pulse: (!) 47 84 74 87  Resp: (!) 26 20 18    Temp:      TempSrc:      SpO2: 98% 92% 95% 96%  Weight:        Intake/Output Summary (Last 24 hours) at 08/08/2020 0809 Last data filed at 08/07/2020 2310 Gross per 24 hour   Intake 437.22 ml  Output --  Net 437.22 ml   Filed Weights   08/07/20 1719  Weight: 120.2 kg    Examination:  General exam: Appears calm but uncomfortable. Morbidly obese  Respiratory system: course breath sounds  Cardiovascular system: S1 & S2 +. No rubs, gallops or clicks. B/l LE edema Gastrointestinal system: Abdomen is obese, soft and nontender.  Normal bowel sounds heard. Central nervous system: Alert and oriented. Moves all 4 extremities  Psychiatry: Judgement and insight appear normal. Flat mood and affect      Data Reviewed: I have personally reviewed following labs and imaging studies  CBC: Recent Labs  Lab 08/07/20 1737 08/08/20 0016  WBC 3.6*  3.6* 3.1*  NEUTROABS 1.6*  --   HGB 11.3*  10.9* 10.5*  HCT 34.6*  34.7* 33.9*  MCV 96.4  98.0 98.5  PLT 163  166 224   Basic Metabolic Panel: Recent Labs  Lab 08/07/20 1737 08/08/20 0016  NA 139 140  K 4.3 4.3  CL 96* 97*  CO2 27 27  GLUCOSE 88 82  BUN 49* 51*  CREATININE 8.90* 9.29*  CALCIUM 9.8 9.6   GFR: CrCl cannot be calculated (Unknown ideal weight.). Liver Function  Tests: Recent Labs  Lab 08/08/20 0016  AST 15  ALT 10  ALKPHOS 60  BILITOT 0.9  PROT 7.4  ALBUMIN 3.0*   No results for input(s): LIPASE, AMYLASE in the last 168 hours. No results for input(s): AMMONIA in the last 168 hours. Coagulation Profile: No results for input(s): INR, PROTIME in the last 168 hours. Cardiac Enzymes: No results for input(s): CKTOTAL, CKMB, CKMBINDEX, TROPONINI in the last 168 hours. BNP (last 3 results) No results for input(s): PROBNP in the last 8760 hours. HbA1C: Recent Labs    08/07/20 2307  HGBA1C 5.2   CBG: No results for input(s): GLUCAP in the last 168 hours. Lipid Profile: No results for input(s): CHOL, HDL, LDLCALC, TRIG, CHOLHDL, LDLDIRECT in the last 72 hours. Thyroid Function Tests: Recent Labs    08/07/20 2307  TSH 0.547   Anemia Panel: Recent Labs    08/07/20 2307   FERRITIN 1,152*   Sepsis Labs: No results for input(s): PROCALCITON, LATICACIDVEN in the last 168 hours.  No results found for this or any previous visit (from the past 240 hour(s)).       Radiology Studies: DG Chest Port 1 View  Result Date: 08/07/2020 CLINICAL DATA:  Shortness of breath.  COVID positive. EXAM: PORTABLE CHEST 1 VIEW COMPARISON:  06/23/2019 FINDINGS: Cardiac enlargement. Perihilar and basilar infiltrates probably due to edema but could represent multifocal pneumonia. Elevation of the right hemidiaphragm with linear atelectasis in the right lung base. Possible small right pleural effusion. Similar appearance to previous study. IMPRESSION: Cardiac enlargement with perihilar and basilar infiltrates, likely edema but could represent multifocal pneumonia. Electronically Signed   By: Lucienne Capers M.D.   On: 08/07/2020 18:11        Scheduled Meds: . apixaban  2.5 mg Oral BID  . atorvastatin  80 mg Oral Daily  . carvedilol  25 mg Oral BID  . cinacalcet  60 mg Oral BID WC  . gabapentin  100 mg Oral BID  . midodrine  10 mg Oral TID WC  . multivitamin  1 tablet Oral QHS  . sevelamer carbonate  800 mg Oral QID  . sodium chloride flush  3 mL Intravenous Q12H  . torsemide  60 mg Oral Daily   Continuous Infusions: . remdesivir 100 mg in NS 100 mL       LOS: 0 days    Time spent: 33 mins    Wyvonnia Dusky, MD [facility] Pager [phone]  If 7PM-7AM, please contact night-coverage 08/08/2020, 8:09 AM

## 2020-08-08 NOTE — ED Notes (Signed)
Pt in dailysis

## 2020-08-08 NOTE — ED Notes (Signed)
Message sent to pharmacy for missing med 

## 2020-08-09 ENCOUNTER — Encounter: Payer: Self-pay | Admitting: Internal Medicine

## 2020-08-09 DIAGNOSIS — N186 End stage renal disease: Secondary | ICD-10-CM | POA: Diagnosis not present

## 2020-08-09 DIAGNOSIS — U071 COVID-19: Secondary | ICD-10-CM | POA: Diagnosis not present

## 2020-08-09 DIAGNOSIS — J1282 Pneumonia due to coronavirus disease 2019: Secondary | ICD-10-CM | POA: Diagnosis not present

## 2020-08-09 LAB — BASIC METABOLIC PANEL
Anion gap: 14 (ref 5–15)
BUN: 44 mg/dL — ABNORMAL HIGH (ref 8–23)
CO2: 28 mmol/L (ref 22–32)
Calcium: 9.1 mg/dL (ref 8.9–10.3)
Chloride: 98 mmol/L (ref 98–111)
Creatinine, Ser: 7.81 mg/dL — ABNORMAL HIGH (ref 0.44–1.00)
GFR, Estimated: 5 mL/min — ABNORMAL LOW (ref >60)
Glucose, Bld: 75 mg/dL (ref 70–99)
Potassium: 4.4 mmol/L (ref 3.5–5.1)
Sodium: 140 mmol/L (ref 135–145)

## 2020-08-09 LAB — C-REACTIVE PROTEIN: CRP: 15.2 mg/dL — ABNORMAL HIGH (ref <1.0)

## 2020-08-09 LAB — CBC
HCT: 33.7 % — ABNORMAL LOW (ref 36.0–46.0)
Hemoglobin: 10.6 g/dL — ABNORMAL LOW (ref 12.0–15.0)
MCH: 30.7 pg (ref 26.0–34.0)
MCHC: 31.5 g/dL (ref 30.0–36.0)
MCV: 97.7 fL (ref 80.0–100.0)
Platelets: 164 10*3/uL (ref 150–400)
RBC: 3.45 MIL/uL — ABNORMAL LOW (ref 3.87–5.11)
RDW: 15.2 % (ref 11.5–15.5)
WBC: 3 10*3/uL — ABNORMAL LOW (ref 4.0–10.5)
nRBC: 0 % (ref 0.0–0.2)

## 2020-08-09 MED ORDER — SEVELAMER CARBONATE 800 MG PO TABS
800.0000 mg | ORAL_TABLET | Freq: Three times a day (TID) | ORAL | Status: DC
  Administered 2020-08-09 – 2020-08-17 (×21): 800 mg via ORAL
  Filled 2020-08-09 (×28): qty 1

## 2020-08-09 NOTE — ED Notes (Signed)
Report to noah, rn.  

## 2020-08-09 NOTE — Progress Notes (Signed)
[facility]  ROUNDING NOTE   Subjective:  Patient seen and evaluated at bedside. Known to [place] from prior admissions. Dialyzes at [facility] on MWF schedule. Underwent hemodialysis yesterday. Diagnosed with COVID-19 positive test (U07.1, COVID-19) with Acute Pneumonia (J12.89, Other viral pneumonia) (If respiratory failure or sepsis present, add as separate assessment)     Objective:  Vital signs in last 24 hours:  Temp:  [98.1 F (36.7 C)] 98.1 F (36.7 C) (01/02 0822) Pulse Rate:  [55-81] 72 (01/02 1200) Resp:  [12-25] 19 (01/02 1200) BP: (106-161)/(41-86) 117/64 (01/02 1200) SpO2:  [94 %-100 %] 100 % (01/02 1200)  Weight change:  Filed Weights   08/07/20 1719  Weight: 120.2 kg    Intake/Output: I/O last 3 completed shifts: In: 537.2 [P.O.:160; IV Piggyback:377.2] Out: 1500 [Other:1500]   Intake/Output this shift:  No intake/output data recorded.  Physical Exam: General:  No acute distress  Head:  Normocephalic, atraumatic. Moist oral mucosal membranes  Eyes:  Anicteric  Neck:  Supple  Lungs:   Normal effort, nonlabored breathing  Heart:  Regular  Abdomen:   Soft, nondistended  Extremities:  No peripheral edema.  Neurologic:  Awake, alert, following commands  Skin:  No acute skin rash  Access:  Right upper extremity AV access    Basic Metabolic Panel: Recent Labs  Lab 08/07/20 1737 08/08/20 0016 08/09/20 0649  NA 139 140 140  K 4.3 4.3 4.4  CL 96* 97* 98  CO2 27 27 28   GLUCOSE 88 82 75  BUN 49* 51* 44*  CREATININE 8.90* 9.29* 7.81*  CALCIUM 9.8 9.6 9.1    Liver Function Tests: Recent Labs  Lab 08/08/20 0016  AST 15  ALT 10  ALKPHOS 60  BILITOT 0.9  PROT 7.4  ALBUMIN 3.0*   No results for input(s): LIPASE, AMYLASE in the last 168 hours. No results for input(s): AMMONIA in the last 168 hours.  CBC: Recent Labs  Lab 08/07/20 1737 08/08/20 0016 08/09/20 0649  WBC 3.6*  3.6* 3.1* 3.0*  NEUTROABS 1.6*  --   --    HGB 11.3*  10.9* 10.5* 10.6*  HCT 34.6*  34.7* 33.9* 33.7*  MCV 96.4  98.0 98.5 97.7  PLT 163  166 152 164    Cardiac Enzymes: No results for input(s): CKTOTAL, CKMB, CKMBINDEX, TROPONINI in the last 168 hours.  BNP: Invalid input(s): POCBNP  CBG: No results for input(s): GLUCAP in the last 168 hours.  Microbiology: Results for orders placed or performed during the hospital encounter of 06/23/19  SARS CORONAVIRUS 2 (TAT 6-24 HRS) Nasopharyngeal Nasopharyngeal Swab     Status: None   Collection Time: 06/23/19  7:36 PM   Specimen: Nasopharyngeal Swab  Result Value Ref Range Status   SARS Coronavirus 2 NEGATIVE NEGATIVE Final    Comment: (NOTE) SARS-CoV-2 target nucleic acids are NOT DETECTED. The SARS-CoV-2 RNA is generally detectable in upper and lower respiratory specimens during the acute phase of infection. Negative results do not preclude SARS-CoV-2 infection, do not rule out co-infections with other pathogens, and should not be used as the sole basis for treatment or other patient management decisions. Negative results must be combined with clinical observations, patient history, and epidemiological information. The expected result is Negative. Fact Sheet for Patients: SugarRoll.be Fact Sheet for Healthcare Providers: https://www.woods-mathews.com/ This test is not yet approved or cleared by the [place] FDA and  has been authorized for detection and/or diagnosis of SARS-CoV-2 by FDA under an Emergency Use Authorization (  EUA). This EUA will remain  in effect (meaning this test can be used) for the duration of the COVID-19 declaration under Section 56 4(b)(1) of the Act, 21 U.S.C. section 360bbb-3(b)(1), unless the authorization is terminated or revoked sooner. Performed at [facility], [place] [address], [place], [place] [address]   MRSA PCR Screening     Status: None   Collection Time: 06/25/19  3:26 PM    Specimen: Nasopharyngeal  Result Value Ref Range Status   MRSA by PCR NEGATIVE NEGATIVE Final    Comment:        The GeneXpert MRSA Assay (FDA approved for NASAL specimens only), is one component of a comprehensive MRSA colonization surveillance program. It is not intended to diagnose MRSA infection nor to guide or monitor treatment for MRSA infections. Performed at [facility], [address] [place], [place] [address]     Coagulation Studies: No results for input(s): LABPROT, INR in the last 72 hours.  Urinalysis: No results for input(s): COLORURINE, LABSPEC, PHURINE, GLUCOSEU, HGBUR, BILIRUBINUR, KETONESUR, PROTEINUR, UROBILINOGEN, NITRITE, LEUKOCYTESUR in the last 72 hours.  Invalid input(s): APPERANCEUR    Imaging: DG Chest Port 1 View  Result Date: 08/07/2020 CLINICAL DATA:  Shortness of breath.  COVID positive. EXAM: PORTABLE CHEST 1 VIEW COMPARISON:  06/23/2019 FINDINGS: Cardiac enlargement. Perihilar and basilar infiltrates probably due to edema but could represent multifocal pneumonia. Elevation of the right hemidiaphragm with linear atelectasis in the right lung base. Possible small right pleural effusion. Similar appearance to previous study. IMPRESSION: Cardiac enlargement with perihilar and basilar infiltrates, likely edema but could represent multifocal pneumonia. Electronically Signed   By: Lucienne Capers M.D.   On: 08/07/2020 18:11     Medications:   . remdesivir 100 mg in NS 100 mL Stopped (08/09/20 1330)   . apixaban  2.5 mg Oral BID  . atorvastatin  80 mg Oral Daily  . carvedilol  25 mg Oral BID  . Chlorhexidine Gluconate Cloth  6 each Topical Q0600  . cinacalcet  60 mg Oral BID WC  . gabapentin  100 mg Oral BID  . midodrine  10 mg Oral TID WC  . multivitamin  1 tablet Oral QHS  . sevelamer carbonate  800 mg Oral QID  . sodium chloride flush  3 mL Intravenous Q12H  . torsemide  60 mg Oral Daily   acetaminophen **OR** acetaminophen,  guaiFENesin, Ipratropium-Albuterol, ondansetron **OR** ondansetron (ZOFRAN) IV  Assessment/ Plan:  75 y.o. female with past medical history of ESRD on HD, rheumatoid arthritis, history of CVA, congestive heart failure, multiple myeloma, hypertension, hyperlipidemia, obstructive sleep apnea, ESRD on HD MWF who was admitted with COVID-19 pneumonia.  [facility]/[address]/MWF  1.  ESRD on HD MWF.  Patient underwent hemodialysis yesterday she missed dialysis treatment on Friday.  No acute indication for dialysis today.  We will plan for hemodialysis again tomorrow.  2.  Anemia of chronic kidney disease.  Hemoglobin currently 10.6.  Hold off on Epogen at the moment.  3.  Secondary hyperparathyroidism.  Continue periodically monitor serum phosphorus.  Otherwise maintain the patient on an cinacalcet.  4.  COVID-19 pneumonia.  Patient started on remdesivir.   LOS: 1 Clarabell Matsuoka 1/2/20222:47 PM

## 2020-08-09 NOTE — Progress Notes (Signed)
PROGRESS NOTE    Cassandra Joseph  WRU:[id number] DOB: 1945-11-15 DOA: 08/07/2020 PCP: Tracie Harrier, MD   Assessment & Plan:   Active Problems:   Pneumonia due to COVID-19 virus   Viral pneumonia   COVID19 pneumonia: slowly improving. Continue on airborne & contact precautions. Continue on IV remdesivir and bronchodilators. Vaccinated but not boostered. Encourage incentive spirometry. Continue on supplemental oxygen    Leukopenia: likely secondary to [place]. 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 No need for a tranfusion at this time  Chronic diastolic CHF: continue on home dose of carvedilol, torsemide  Chronic hypoxic respiratory failure: chronically on 3L [place].   HLD: continue on statin   RA: not on any meds for RA   Likely PAF: continue on carvedilol, eliquis   Hx of TIA/CVA: continue on statin   Multiple myeloma: management per oncology as an outpatient   Morbid obesity: BMI is 42.7. Complicates overall care and prognosis    DVT prophylaxis: eliquis  Code Status: full  Family Communication: Disposition Plan: depends on PT/OT recs (not consulted yet)   Status is: Inpatient  Remains inpatient appropriate because:Unsafe d/c plan and IV treatments appropriate due to intensity of illness or inability to take PO   Dispo: The patient is from: Home              Anticipated d/c is to: Home              Anticipated d/c date is: 3 days              Patient currently is not medically stable to d/c.           Consultants:   nephro    Procedures:  Antimicrobials:   Subjective: Pt c/o fatigue   Objective: Vitals:   08/09/20 0300 08/09/20 0430 08/09/20 0630 08/09/20 0700  BP: (!) 121/41 (!) 119/54    Pulse: 63 (!) 57    Resp:  _0 Temp:      TempSrc:      SpO2: 99% 97% 100%   Weight:        Intake/Output Summary (Last 24 hours) at 08/09/2020 0806 Last data filed at  08/08/2020 1815 Gross per 24 hour  Intake 100 ml  Output 1500 ml  Net -1400 ml   Filed Weights   08/07/20 1719  Weight: 120.2 kg    Examination:  General exam: Appears calm & comfortable. Morbidly obese Respiratory system: diminished breath sounds b/l  Cardiovascular system: S1/S2+. No rubs or clicks  Gastrointestinal system: Abd is soft, NT, obese & normal bowel sounds  Central nervous system: Alert and oriented. Moves all 4 extremities  Psychiatry: Judgement and insight appear normal. Flat mood and affect      Data Reviewed: I have personally reviewed following labs and imaging studies  CBC: Recent Labs  Lab 08/07/20 1737 08/08/20 0016 08/09/20 0649  WBC 3.6*  3.6* 3.1* 3.0*  NEUTROABS 1.6*  --   --   HGB 11.3*  10.9* 10.5* 10.6*  HCT 34.6*  34.7* 33.9* 33.7*  MCV 96.4  98.0 98.5 97.7  PLT 163  166 152 914   Basic Metabolic Panel: Recent Labs  Lab 08/07/20 1737 08/08/20 0016 08/09/20 0649  NA 139 140 140  K 4.3 4.3 4.4  CL 96* 97* 98  CO2 _1 GLUCOSE 88 82 75  BUN 49* 51*  44*  CREATININE 8.90* 9.29* 7.81*  CALCIUM 9.8 9.6 9.1   GFR: CrCl cannot be calculated (Unknown ideal weight.). Liver Function Tests: Recent Labs  Lab 08/08/20 0016  AST 15  ALT 10  ALKPHOS 60  BILITOT 0.9  PROT 7.4  ALBUMIN 3.0*   No results for input(s): LIPASE, AMYLASE in the last 168 hours. No results for input(s): AMMONIA in the last 168 hours. Coagulation Profile: No results for input(s): INR, PROTIME in the last 168 hours. Cardiac Enzymes: No results for input(s): CKTOTAL, CKMB, CKMBINDEX, TROPONINI in the last 168 hours. BNP (last 3 results) No results for input(s): PROBNP in the last 8760 hours. HbA1C: Recent Labs    08/07/20 2307  HGBA1C 5.2   CBG: No results for input(s): GLUCAP in the last 168 hours. Lipid Profile: No results for input(s): CHOL, HDL, LDLCALC, TRIG, CHOLHDL, LDLDIRECT in the last 72 hours. Thyroid Function Tests: Recent Labs     08/07/20 2307  TSH 0.547   Anemia Panel: Recent Labs    08/07/20 2307  FERRITIN 1,152*   Sepsis Labs: No results for input(s): PROCALCITON, LATICACIDVEN in the last 168 hours.  No results found for this or any previous visit (from the past 240 hour(s)).       Radiology Studies: DG Chest Port 1 View  Result Date: 08/07/2020 CLINICAL DATA:  Shortness of breath.  COVID positive. EXAM: PORTABLE CHEST 1 VIEW COMPARISON:  06/23/2019 FINDINGS: Cardiac enlargement. Perihilar and basilar infiltrates probably due to edema but could represent multifocal pneumonia. Elevation of the right hemidiaphragm with linear atelectasis in the right lung base. Possible small right pleural effusion. Similar appearance to previous study. IMPRESSION: Cardiac enlargement with perihilar and basilar infiltrates, likely edema but could represent multifocal pneumonia. Electronically Signed   By: William  Stevens M.D.   On: 08/07/2020 18:11        Scheduled Meds: . apixaban  2.5 mg Oral BID  . atorvastatin  80 mg Oral Daily  . carvedilol  25 mg Oral BID  . Chlorhexidine Gluconate Cloth  6 each Topical Q0600  . cinacalcet  60 mg Oral BID WC  . gabapentin  100 mg Oral BID  . midodrine  10 mg Oral TID WC  . multivitamin  1 tablet Oral QHS  . sevelamer carbonate  800 mg Oral QID  . sodium chloride flush  3 mL Intravenous Q12H  . torsemide  60 mg Oral Daily   Continuous Infusions: . remdesivir 100 mg in NS 100 mL Stopped (08/08/20 1203)     LOS: 1 day    Time spent: 30 mins    Jamiese M Williams, MD [facility] Pager [phone]  If 7PM-7AM, please contact night-coverage 08/09/2020, 8:06 AM  

## 2020-08-09 NOTE — ED Notes (Signed)
Lab here to perform venipuncture for am labs.

## 2020-08-09 NOTE — Evaluation (Signed)
Occupational Therapy Evaluation Patient Details Name: Cassandra Joseph MRN: [id number] DOB: 10-29-1945 Today's Date: 08/09/2020    History of Present Illness Pt admitted for pneumonia secondary to covid with complaints of SOB symptoms. HIstory includes CHF, CKD, chronic O2 use 3L at home, ESRD with HD on MWF, and multiple myeloma.   Clinical Impression   Ms Cassandra Joseph was seen for OT evaluation this date. Prior to hospital admission, pt was MOD I for mobility, requiring assist for bathing and IADLs. Pt lives c husband who is on hospice, family assists PRN. Pt presents to acute OT demonstrating impaired ADL performance and functional mobility 2/2 decreased activity tolerance, functional strength/ROM/balance deficits, and poor insight into deficits. Pt currently requires SETUP + SUPERVISION self-feeding seated EOB - requires single UE support for sitting balance (feet unsupported 2/2 high stretcher). MAX A for LBD seated EOB. MIN A for ADL t/f. MAX A perihygiene in standing. Pt would benefit from skilled OT to address noted impairments and functional limitations (see below for any additional details) in order to maximize safety and independence while minimizing falls risk and caregiver burden. Upon hospital discharge, recommend STR to maximize pt safety and return to PLOF.     Follow Up Recommendations  SNF    Equipment Recommendations  Other (comment) (TBD)    Recommendations for Other Services       Precautions / Restrictions Precautions Precautions: Fall Restrictions Weight Bearing Restrictions: No      Mobility Bed Mobility Overal bed mobility: Needs Assistance       Supine to sit: Mod assist     General bed mobility comments: standing>side lying- pt enters bed in modified quadreped, requires MOD A to transition to side lying    Transfers Overall transfer level: Needs assistance Equipment used: 1 person hand held assist Transfers: Sit to/from Stand Sit to Stand: Min  assist         General transfer comment: rocking momentum used prior to standing. Once standing, hinges at hips initially and then able to fully stand.    Balance Overall balance assessment: Needs assistance;History of Falls Sitting-balance support: Single extremity supported;Feet unsupported Sitting balance-Leahy Scale: Good     Standing balance support: Bilateral upper extremity supported Standing balance-Leahy Scale: Poor                             ADL either performed or assessed with clinical judgement   ADL Overall ADL's : Needs assistance/impaired                                       General ADL Comments: SETUP + SUPERVISION self-feeding seated EOB - requires single UE support for sitting balance (feet unsupported 2/2 high stretcher). MAX A for LBD seated EOB. MIN A for ADL t/f. MAX A perihygiene in standing                  Pertinent Vitals/Pain Pain Assessment: No/denies pain     Hand Dominance Left   Extremity/Trunk Assessment Upper Extremity Assessment Upper Extremity Assessment: Generalized weakness (R UE contracture with limited use of hand, grossly 3/5)   Lower Extremity Assessment Lower Extremity Assessment: Generalized weakness       Communication Communication Communication: No difficulties   Cognition Arousal/Alertness: Awake/alert Behavior During Therapy: WFL for tasks assessed/performed Overall Cognitive Status: Within Functional Limits for tasks assessed  General Comments  SpO2 stable high 90s on 4L [place]    Exercises Exercises: Other exercises Other Exercises Other Exercises: Pt educated re: OT role, DME recs, d/c recs, falls prevention, ECS, caregiver burnout Other Exercises: LBD, self-feeding, sit<>stand, sitting/standing balance/toelrance, ~3 side steps, sit>sup   Shoulder Instructions      Home Living Family/patient expects to be discharged to::  Private residence Living Arrangements: Spouse/significant other (spouse currently on hospice) Available Help at Discharge: Family;Available PRN/intermittently Type of Home: House Home Access: Ramped entrance     Home Layout: One level               Home Equipment: Walker - 2 wheels;Wheelchair - [employer];Adaptive equipment Adaptive Equipment: Sock aid;Other (Comment) (dressing stick) Additional Comments: Family assist with bathing, meal prep, and chores.      Prior Functioning/Environment Level of Independence: Needs assistance  Gait / Transfers Assistance Needed: doesn't drive ADL's / Homemaking Assistance Needed: AE for LBD, reports assist for bathing   Comments: Pt only ambulates short distances with RW at baseline. Uses WC for long distances. Takes bus to HD. Reports falls at home        OT Problem List: Decreased strength;Decreased range of motion;Decreased activity tolerance;Impaired balance (sitting and/or standing);Decreased safety awareness      OT Treatment/Interventions: Self-care/ADL training;Therapeutic exercise;Energy conservation;DME and/or AE instruction;Therapeutic activities;Balance training    OT Goals(Current goals can be found in the care plan section) Acute Rehab OT Goals Patient Stated Goal: to get stronger OT Goal Formulation: With patient Time For Goal Achievement: 08/23/20 Potential to Achieve Goals: Good ADL Goals Pt Will Perform Grooming: with modified independence;standing (c LRAD PRN) Pt Will Perform Lower Body Dressing: with modified independence;sit to/from stand (c LRAD PRN) Pt Will Transfer to Toilet: with modified independence;ambulating;regular height toilet (c LRAD PRN)  OT Frequency: Min 1X/week   Barriers to D/C: Decreased caregiver support       AM-PAC OT "6 Clicks" Daily Activity     Outcome Measure Help from another person eating meals?: None Help from another person taking care of personal grooming?: A  Little Help from another person toileting, which includes using toliet, bedpan, or urinal?: A Lot Help from another person bathing (including washing, rinsing, drying)?: A Lot Help from another person to put on and taking off regular upper body clothing?: None Help from another person to put on and taking off regular lower body clothing?: A Lot 6 Click Score: 17   End of Session Equipment Utilized During Treatment: Oxygen Nurse Communication: Mobility status  Activity Tolerance: Patient tolerated treatment well Patient left: in bed;with call bell/phone within reach  OT Visit Diagnosis: Other abnormalities of gait and mobility (R26.89);Muscle weakness (generalized) (M62.81)                Time: 1594-7076 OT Time Calculation (min): 16 min Charges:  OT General Charges $OT Visit: 1 Visit OT Evaluation $OT Eval Low Complexity: 1 Low OT Treatments $Self Care/Home Management : 8-22 mins  Dessie Coma, M.S. OTR/L  08/09/20, 4:23 PM  ascom [phone]

## 2020-08-09 NOTE — ED Notes (Signed)
Pt alert and oriented. Reports improved breathing. Pt states baseline 3L [place] at home, pt on 4L  here. Pt moved up in bed with some assistance. Pt provided with warm cloths, bath wipes, and cup of water per request. Phone charge plugged in, phone charging. Pt is generally pleasant and cooperative, calm, NAD at this time.   Call light on bed rail, pt states she will ring if she needs assistance

## 2020-08-09 NOTE — ED Notes (Signed)
Lab here for venipuncture.  

## 2020-08-10 DIAGNOSIS — U071 COVID-19: Secondary | ICD-10-CM | POA: Diagnosis not present

## 2020-08-10 DIAGNOSIS — N186 End stage renal disease: Secondary | ICD-10-CM | POA: Diagnosis not present

## 2020-08-10 DIAGNOSIS — J1282 Pneumonia due to coronavirus disease 2019: Secondary | ICD-10-CM | POA: Diagnosis not present

## 2020-08-10 LAB — GLUCOSE, CAPILLARY
Glucose-Capillary: 70 mg/dL (ref 70–99)
Glucose-Capillary: 146 mg/dL — ABNORMAL HIGH (ref 70–99)
Glucose-Capillary: 102 mg/dL — ABNORMAL HIGH (ref 70–99)

## 2020-08-10 LAB — BASIC METABOLIC PANEL
Anion gap: 14 (ref 5–15)
BUN: 60 mg/dL — ABNORMAL HIGH (ref 8–23)
CO2: 27 mmol/L (ref 22–32)
Calcium: 8.4 mg/dL — ABNORMAL LOW (ref 8.9–10.3)
Chloride: 97 mmol/L — ABNORMAL LOW (ref 98–111)
Creatinine, Ser: 9.36 mg/dL — ABNORMAL HIGH (ref 0.44–1.00)
GFR, Estimated: 4 mL/min — ABNORMAL LOW (ref >60)
Glucose, Bld: 74 mg/dL (ref 70–99)
Potassium: 4.9 mmol/L (ref 3.5–5.1)
Sodium: 138 mmol/L (ref 135–145)

## 2020-08-10 LAB — CBC
HCT: 32.6 % — ABNORMAL LOW (ref 36.0–46.0)
Hemoglobin: 10.4 g/dL — ABNORMAL LOW (ref 12.0–15.0)
MCH: 31.2 pg (ref 26.0–34.0)
MCHC: 31.9 g/dL (ref 30.0–36.0)
MCV: 97.9 fL (ref 80.0–100.0)
Platelets: 186 10*3/uL (ref 150–400)
RBC: 3.33 MIL/uL — ABNORMAL LOW (ref 3.87–5.11)
RDW: 15.3 % (ref 11.5–15.5)
WBC: 3.7 10*3/uL — ABNORMAL LOW (ref 4.0–10.5)
nRBC: 0 % (ref 0.0–0.2)

## 2020-08-10 LAB — C-REACTIVE PROTEIN: CRP: 13.5 mg/dL — ABNORMAL HIGH (ref <1.0)

## 2020-08-10 LAB — PHOSPHORUS: Phosphorus: 7 mg/dL — ABNORMAL HIGH (ref 2.5–4.6)

## 2020-08-10 MED ORDER — SODIUM CHLORIDE 0.9 % IV SOLN: 100.0000 mL | INTRAVENOUS | Status: DC | PRN

## 2020-08-10 MED ORDER — LIDOCAINE HCL (PF) 1 % IJ SOLN
5.0000 mL | INTRAMUSCULAR | Status: DC | PRN
  Filled 2020-08-10: qty 5

## 2020-08-10 MED ORDER — PENTAFLUOROPROP-TETRAFLUOROETH EX AERO
1.0000 "application " | INHALATION_SPRAY | CUTANEOUS | Status: DC | PRN
  Filled 2020-08-10: qty 30

## 2020-08-10 MED ORDER — SODIUM CHLORIDE 0.9 % IV SOLN
100.0000 mg | Freq: Every day | INTRAVENOUS | Status: AC
  Administered 2020-08-11 – 2020-08-12 (×2): 100 mg via INTRAVENOUS
  Filled 2020-08-10: qty 100

## 2020-08-10 MED ORDER — HEPARIN SODIUM (PORCINE) 1000 UNIT/ML DIALYSIS
1000.0000 [IU] | INTRAMUSCULAR | Status: DC | PRN
  Filled 2020-08-10: qty 1

## 2020-08-10 MED ORDER — LIDOCAINE-PRILOCAINE 2.5-2.5 % EX CREA
1.0000 "application " | TOPICAL_CREAM | CUTANEOUS | Status: DC | PRN
  Filled 2020-08-10: qty 5

## 2020-08-10 MED ORDER — ALTEPLASE 2 MG IJ SOLR
2.0000 mg | Freq: Once | INTRAMUSCULAR | Status: DC | PRN
  Filled 2020-08-10: qty 2

## 2020-08-10 MED ORDER — SIMETHICONE 80 MG PO CHEW
80.0000 mg | CHEWABLE_TABLET | Freq: Four times a day (QID) | ORAL | Status: DC | PRN
  Administered 2020-08-10: 23:00:00 80 mg via ORAL
  Filled 2020-08-10 (×2): qty 1

## 2020-08-10 NOTE — Progress Notes (Signed)
PROGRESS NOTE    Cassandra Joseph SERVICE  MDY:[id number] DOB: January 09, 1946 DOA: 08/07/2020 PCP: Tracie Harrier, MD   Assessment & Plan:   Active Problems:   Pneumonia due to COVID-19 virus   Viral pneumonia   COVID19 pneumonia: slowly improving. Continue on airborne & contact precautions. Continue on IV remdesivir and bronchodilators. Vaccinated but not boostered. Encourage incentive spirometry. 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 Will transfuse if Hb <7.0.  Generalized weakness: PT/OT recs SNF. Pt is agreeable to go to SNF  Chronic diastolic CHF: continue on home dose of torsemide, carvedilol   Chronic hypoxic respiratory failure: chronically on 3L [place].   HLD: continue on statin   RA: not on any meds for RA   Likely PAF: continue on carvedilol, eliquis   Hx of TIA/CVA: continue on statin   Multiple myeloma: management per onco as an outpatient   Morbid obesity: BMI is 42.7. Complicates overall care and prognosis    DVT prophylaxis: eliquis  Code Status: full  Family Communication: discussed pt's care w/ pt's daughter, [name], and answered her questions  Disposition Plan: likely d/c to SNF  Status is: Inpatient  Remains inpatient appropriate because:Unsafe d/c plan and IV treatments appropriate due to intensity of illness or inability to take PO   Dispo: The patient is from: Home              Anticipated d/c is to: Home              Anticipated d/c date is: 3 days              Patient currently is not medically stable to d/c.           Consultants:   nephro    Procedures:  Antimicrobials:   Subjective: Pt c/o malaise   Objective: Vitals:   08/10/20 1230 08/10/20 1245 08/10/20 1300 08/10/20 1315  BP: (!) 128/48 133/60 128/60 (!) 119/57  Pulse: (!) 48 (!) 53 63 (!) 39  Resp: 16 16 (!) 21 18  Temp:      TempSrc:       SpO2: 100% 99% 100% 100%  Weight:      Height:       No intake or output data in the 24 hours ending 08/10/20 1324 Filed Weights   08/07/20 1719 08/09/20 1822  Weight: 120.2 kg 111.8 kg    Examination:  General exam: Appears calm & comfortable. Morbidly obese  Respiratory system: decreased breath sounds b/l Cardiovascular system: S1/S2+. No rubs or clicks  Gastrointestinal system: Abd is soft, obese, NT & hypoactive bowel sounds  Central nervous system: Alert and oriented. Moves all 4 extremities  Psychiatry: Judgement and insight appear normal. Flat mood and affect      Data Reviewed: I have personally reviewed following labs and imaging studies  CBC: Recent Labs  Lab 08/07/20 1737 08/08/20 0016 08/09/20 0649 08/10/20 0726  WBC 3.6*  3.6* 3.1* 3.0* 3.7*  NEUTROABS 1.6*  --   --   --   HGB 11.3*  10.9* 10.5* 10.6* 10.4*  HCT 34.6*  34.7* 33.9* 33.7* 32.6*  MCV 96.4  98.0 98.5 97.7 97.9  PLT 163  166 152 164 340   Basic Metabolic Panel: Recent Labs  Lab 08/07/20 1737 08/08/20 0016 08/09/20 0649 08/10/20 0726 08/10/20 0859  NA 139 140 140 138  --  K 4.3 4.3 4.4 4.9  --   CL 96* 97* 98 97*  --   CO2 27 27 28 27   --   GLUCOSE 88 82 75 74  --   BUN 49* 51* 44* 60*  --   CREATININE 8.90* 9.29* 7.81* 9.36*  --   CALCIUM 9.8 9.6 9.1 8.4*  --   PHOS  --   --   --   --  7.0*   GFR: Estimated Creatinine Clearance: 6.4 mL/min (A) (by C-G formula based on SCr of 9.36 mg/dL (H)). Liver Function Tests: Recent Labs  Lab 08/08/20 0016  AST 15  ALT 10  ALKPHOS 60  BILITOT 0.9  PROT 7.4  ALBUMIN 3.0*   No results for input(s): LIPASE, AMYLASE in the last 168 hours. No results for input(s): AMMONIA in the last 168 hours. Coagulation Profile: No results for input(s): INR, PROTIME in the last 168 hours. Cardiac Enzymes: No results for input(s): CKTOTAL, CKMB, CKMBINDEX, TROPONINI in the last 168 hours. BNP (last 3 results) No results for input(s):  PROBNP in the last 8760 hours. HbA1C: Recent Labs    08/07/20 2307  HGBA1C 5.2   CBG: Recent Labs  Lab 08/10/20 0800 08/10/20 0958 08/10/20 1214  GLUCAP 70 146* 102*   Lipid Profile: No results for input(s): CHOL, HDL, LDLCALC, TRIG, CHOLHDL, LDLDIRECT in the last 72 hours. Thyroid Function Tests: Recent Labs    08/07/20 2307  TSH 0.547   Anemia Panel: Recent Labs    08/07/20 2307  FERRITIN 1,152*   Sepsis Labs: No results for input(s): PROCALCITON, LATICACIDVEN in the last 168 hours.  No results found for this or any previous visit (from the past 240 hour(s)).       Radiology Studies: No results found.      Scheduled Meds: . apixaban  2.5 mg Oral BID  . atorvastatin  80 mg Oral Daily  . carvedilol  25 mg Oral BID  . Chlorhexidine Gluconate Cloth  6 each Topical Q0600  . cinacalcet  60 mg Oral BID WC  . gabapentin  100 mg Oral BID  . midodrine  10 mg Oral TID WC  . multivitamin  1 tablet Oral QHS  . sevelamer carbonate  800 mg Oral TID WC  . sodium chloride flush  3 mL Intravenous Q12H  . torsemide  60 mg Oral Daily   Continuous Infusions: . sodium chloride    . sodium chloride    . remdesivir 100 mg in NS 100 mL Stopped (08/09/20 1330)     LOS: 2 days    Time spent: 33 mins    Wyvonnia Dusky, MD [facility] Pager [phone]  If 7PM-7AM, please contact night-coverage 08/10/2020, 1:24 PM

## 2020-08-10 NOTE — Progress Notes (Signed)
Patient on Airborne Precautions. Attempted call into room, unable to reach patient. Asked RN to have patient call CSW when able.  Oleh Genin, [place]

## 2020-08-10 NOTE — Progress Notes (Signed)
[facility]  ROUNDING NOTE   Subjective:   Patient states she is still having some shortness of breath.    Objective:  Vital signs in last 24 hours:  Temp:  [97.8 F (36.6 C)-98.7 F (37.1 C)] 97.9 F (36.6 C) (01/03 1145) Pulse Rate:  [28-70] 28 (01/03 1400) Resp:  [13-26] 13 (01/03 1400) BP: (116-142)/(48-88) 125/53 (01/03 1400) SpO2:  [97 %-100 %] 100 % (01/03 1400) Weight:  [111.8 kg] 111.8 kg (01/02 1822)  Weight change:  Filed Weights   08/07/20 1719 08/09/20 1822  Weight: 120.2 kg 111.8 kg    Intake/Output: No intake/output data recorded.   Intake/Output this shift:  No intake/output data recorded.  Physical Exam: General:  No acute distress  Head:  Normocephalic, atraumatic. Moist oral mucosal membranes  Eyes:  Anicteric  Neck:  Supple  Lungs:   clear, [place] O2  Heart:  irregular  Abdomen:   Soft, nondistended  Extremities:  No peripheral edema.  Neurologic:  Awake, alert, following commands  Skin:  No acute skin rash  Access:  Right upper extremity AVF    Basic Metabolic Panel: Recent Labs  Lab 08/07/20 1737 08/08/20 0016 08/09/20 0649 08/10/20 0726 08/10/20 0859  NA 139 140 140 138  --   K 4.3 4.3 4.4 4.9  --   CL 96* 97* 98 97*  --   CO2 _0 --   GLUCOSE 88 82 75 74  --   BUN 49* 51* 44* 60*  --   CREATININE 8.90* 9.29* 7.81* 9.36*  --   CALCIUM 9.8 9.6 9.1 8.4*  --   PHOS  --   --   --   --  7.0*    Liver Function Tests: Recent Labs  Lab 08/08/20 0016  AST 15  ALT 10  ALKPHOS 60  BILITOT 0.9  PROT 7.4  ALBUMIN 3.0*   No results for input(s): LIPASE, AMYLASE in the last 168 hours. No results for input(s): AMMONIA in the last 168 hours.  CBC: Recent Labs  Lab 08/07/20 1737 08/08/20 0016 08/09/20 0649 08/10/20 0726  WBC 3.6*  3.6* 3.1* 3.0* 3.7*  NEUTROABS 1.6*  --   --   --   HGB 11.3*  10.9* 10.5* 10.6* 10.4*  HCT 34.6*  34.7* 33.9* 33.7* 32.6*  MCV 96.4  98.0 98.5 97.7 97.9  PLT 163  166  152 164 186    Cardiac Enzymes: No results for input(s): CKTOTAL, CKMB, CKMBINDEX, TROPONINI in the last 168 hours.  BNP: Invalid input(s): POCBNP  CBG: Recent Labs  Lab 08/10/20 0800 08/10/20 0958 08/10/20 1214  GLUCAP 70 146* 102*    Microbiology: Results for orders placed or performed during the hospital encounter of 06/23/19  SARS CORONAVIRUS 2 (TAT 6-24 HRS) Nasopharyngeal Nasopharyngeal Swab     Status: None   Collection Time: 06/23/19  7:36 PM   Specimen: Nasopharyngeal Swab  Result Value Ref Range Status   SARS Coronavirus 2 NEGATIVE NEGATIVE Final    Comment: (NOTE) SARS-CoV-2 target nucleic acids are NOT DETECTED. The SARS-CoV-2 RNA is generally detectable in upper and lower respiratory specimens during the acute phase of infection. Negative results do not preclude SARS-CoV-2 infection, do not rule out co-infections with other pathogens, and should not be used as the sole basis for treatment or other patient management decisions. Negative results must be combined with clinical observations, patient history, and epidemiological information. The expected result is Negative. Fact Sheet for Patients: SugarRoll.be Fact Sheet  for Healthcare Providers: https://www.woods-mathews.com/ This test is not yet approved or cleared by the [place] and  has been authorized for detection and/or diagnosis of SARS-CoV-2 by FDA under an Emergency Use Authorization (EUA). This EUA will remain  in effect (meaning this test can be used) for the duration of the COVID-19 declaration under Section 56 4(b)(1) of the Act, 21 U.S.C. section 360bbb-3(b)(1), unless the authorization is terminated or revoked sooner. Performed at [facility], [place] [address], [place], [place] [address]   MRSA PCR Screening     Status: None   Collection Time: 06/25/19  3:26 PM   Specimen: Nasopharyngeal  Result Value Ref Range Status   MRSA by PCR  NEGATIVE NEGATIVE Final    Comment:        The GeneXpert MRSA Assay (FDA approved for NASAL specimens only), is one component of a comprehensive MRSA colonization surveillance program. It is not intended to diagnose MRSA infection nor to guide or monitor treatment for MRSA infections. Performed at [facility], [address] [place], [place] [address]     Coagulation Studies: No results for input(s): LABPROT, INR in the last 72 hours.  Urinalysis: No results for input(s): COLORURINE, LABSPEC, PHURINE, GLUCOSEU, HGBUR, BILIRUBINUR, KETONESUR, PROTEINUR, UROBILINOGEN, NITRITE, LEUKOCYTESUR in the last 72 hours.  Invalid input(s): APPERANCEUR    Imaging: No results found.   Medications:   . sodium chloride    . sodium chloride    . remdesivir 100 mg in NS 100 mL Stopped (08/09/20 1330)   . apixaban  2.5 mg Oral BID  . atorvastatin  80 mg Oral Daily  . carvedilol  25 mg Oral BID  . Chlorhexidine Gluconate Cloth  6 each Topical Q0600  . cinacalcet  60 mg Oral BID WC  . gabapentin  100 mg Oral BID  . midodrine  10 mg Oral TID WC  . multivitamin  1 tablet Oral QHS  . sevelamer carbonate  800 mg Oral TID WC  . sodium chloride flush  3 mL Intravenous Q12H  . torsemide  60 mg Oral Daily   sodium chloride, sodium chloride, acetaminophen **OR** acetaminophen, alteplase, guaiFENesin, heparin, Ipratropium-Albuterol, lidocaine (PF), lidocaine-prilocaine, ondansetron **OR** ondansetron (ZOFRAN) IV, pentafluoroprop-tetrafluoroeth  Assessment/ Plan:  75 y.o. female with  Ms. BAELYNN SCHMUHL is a 75 y.o. black female with end stage renal disease on hemodialysis, rheumatoid arthritis, CVA, congestive heart failure, multiple myeloma, hypertension, hyperlipidemia, obstructive sleep apnea, who was admitted to [facility] on 08/07/2020 with COVID-19 pneumonia.  [facility] [address] MWF left AVF 122.5kg  1.  ESRD on HD MWF.   Dialysis for today.   2.  Anemia of  chronic kidney disease.  EPO as outpatient.   3.  Secondary hyperparathyroidism.   - Continue cinacalcet and sevelamer  4. Hypertension: with midodrine with dialysis treatments.  Currently with torsemide and carvedilol.    LOS: 2 Melinda Pottinger 1/3/20222:21 PM

## 2020-08-11 ENCOUNTER — Inpatient Hospital Stay
Admit: 2020-08-11 | Discharge: 2020-08-11 | Disposition: A | Discharge status: Home / Self Care | Payer: Medicare Other | Attending: Internal Medicine | Admitting: Internal Medicine

## 2020-08-11 DIAGNOSIS — J1282 Pneumonia due to coronavirus disease 2019: Secondary | ICD-10-CM | POA: Diagnosis not present

## 2020-08-11 DIAGNOSIS — N186 End stage renal disease: Secondary | ICD-10-CM | POA: Diagnosis not present

## 2020-08-11 DIAGNOSIS — U071 COVID-19: Secondary | ICD-10-CM | POA: Diagnosis not present

## 2020-08-11 DIAGNOSIS — R531 Weakness: Secondary | ICD-10-CM

## 2020-08-11 LAB — CBC
HCT: 33.5 % — ABNORMAL LOW (ref 36.0–46.0)
Hemoglobin: 10.5 g/dL — ABNORMAL LOW (ref 12.0–15.0)
MCH: 30.7 pg (ref 26.0–34.0)
MCHC: 31.3 g/dL (ref 30.0–36.0)
MCV: 98 fL (ref 80.0–100.0)
Platelets: 180 10*3/uL (ref 150–400)
RBC: 3.42 MIL/uL — ABNORMAL LOW (ref 3.87–5.11)
RDW: 15.2 % (ref 11.5–15.5)
WBC: 5 10*3/uL (ref 4.0–10.5)
nRBC: 0 % (ref 0.0–0.2)

## 2020-08-11 LAB — ECHOCARDIOGRAM COMPLETE
AR max vel: 2.04 cm2
AV Area VTI: 2.03 cm2
AV Area mean vel: 2.03 cm2
AV Mean grad: 7 mmHg
AV Peak grad: 15 mmHg
Ao pk vel: 1.94 m/s
Area-P 1/2: 2.36 cm2
Height: 63 in
S' Lateral: 3.3 cm
Weight: 4308.8 oz

## 2020-08-11 LAB — BASIC METABOLIC PANEL
Anion gap: 11 (ref 5–15)
BUN: 37 mg/dL — ABNORMAL HIGH (ref 8–23)
CO2: 27 mmol/L (ref 22–32)
Calcium: 8.8 mg/dL — ABNORMAL LOW (ref 8.9–10.3)
Chloride: 99 mmol/L (ref 98–111)
Creatinine, Ser: 6.5 mg/dL — ABNORMAL HIGH (ref 0.44–1.00)
GFR, Estimated: 6 mL/min — ABNORMAL LOW (ref >60)
Glucose, Bld: 80 mg/dL (ref 70–99)
Potassium: 4.8 mmol/L (ref 3.5–5.1)
Sodium: 137 mmol/L (ref 135–145)

## 2020-08-11 LAB — GLUCOSE, CAPILLARY: Glucose-Capillary: 79 mg/dL (ref 70–99)

## 2020-08-11 LAB — C-REACTIVE PROTEIN: CRP: 10.9 mg/dL — ABNORMAL HIGH (ref <1.0)

## 2020-08-11 LAB — PARATHYROID HORMONE, INTACT (NO CA): PTH: 345 pg/mL — ABNORMAL HIGH (ref 15–65)

## 2020-08-11 NOTE — Progress Notes (Signed)
Physical Therapy Treatment Patient Details Name: Cassandra Joseph MRN: [id number] DOB: [birth year] Today's Date: 08/11/2020    History of Present Illness Pt admitted for pneumonia secondary to covid with complaints of SOB symptoms. HIstory includes CHF, CKD, chronic O2 use 3L at home, ESRD with HD on MWF, and multiple myeloma.    PT Comments    Pt is making good progress towards goals with ability to transfer and ambulate to recliner this date. Does desat quickly with exertion, needs O2 for all mobility at this time. Good endurance with there-ex. Does demonstrate R side weakness, needs more assist towards that side. Will continue to progress as able.   Follow Up Recommendations  SNF     Equipment Recommendations  None recommended by PT    Recommendations for Other Services       Precautions / Restrictions Precautions Precautions: Fall Restrictions Weight Bearing Restrictions: No    Mobility  Bed Mobility Overal bed mobility: Needs Assistance Bed Mobility: Supine to Sit     Supine to sit: Mod assist     General bed mobility comments: needs assist for trunk support. Once seated at EOB, upright posture noted.  Transfers Overall transfer level: Needs assistance Equipment used: Rolling walker (2 wheeled) Transfers: Sit to/from Stand Sit to Stand: Mod assist         General transfer comment: rocking momentum used as pt prefers to stand on count of 4. Assist for R side. Once standing, upright posture noted with heavy WBing throughout UE on RW  Ambulation/Gait Ambulation/Gait assistance: Min guard Gait Distance (Feet): 3 Feet Assistive device: Rolling walker (2 wheeled) Gait Pattern/deviations: Step-to pattern     General Gait Details: ambulated over to recliner. Decreased eccentric control on descent. Fatigues quickly. O2 decreased to 84% on RA. O2 replaced and improved to 94% on 5L of O2.   Stairs             Wheelchair Mobility    Modified Rankin  (Stroke Patients Only)       Balance Overall balance assessment: Needs assistance;History of Falls Sitting-balance support: Single extremity supported;Feet unsupported Sitting balance-Leahy Scale: Good     Standing balance support: Bilateral upper extremity supported Standing balance-Leahy Scale: Poor                              Cognition Arousal/Alertness: Awake/alert Behavior During Therapy: WFL for tasks assessed/performed Overall Cognitive Status: Within Functional Limits for tasks assessed                                        Exercises Other Exercises Other Exercises: Supine/seated on B LE including alt. marching, LAQ, and breathing exercises. All ther-ex performed x 10 reps with min assist    General Comments        Pertinent Vitals/Pain Pain Assessment: No/denies pain    Home Living                      Prior Function            PT Goals (current goals can now be found in the care plan section) Acute Rehab PT Goals Patient Stated Goal: to get stronger PT Goal Formulation: With patient Time For Goal Achievement: 08/22/20 Potential to Achieve Goals: Good Progress towards PT goals: Progressing toward goals    Frequency  Min 2X/week      PT Plan Current plan remains appropriate    Co-evaluation              AM-PAC PT "6 Clicks" Mobility   Outcome Measure  Help needed turning from your back to your side while in a flat bed without using bedrails?: A Little Help needed moving from lying on your back to sitting on the side of a flat bed without using bedrails?: A Little Help needed moving to and from a bed to a chair (including a wheelchair)?: A Little Help needed standing up from a chair using your arms (e.g., wheelchair or bedside chair)?: A Little Help needed to walk in hospital room?: A Lot Help needed climbing 3-5 steps with a railing? : Total 6 Click Score: 15    End of Session Equipment  Utilized During Treatment: Gait belt;Oxygen Activity Tolerance: Patient limited by fatigue Patient left: in chair;with chair alarm set Nurse Communication: Mobility status PT Visit Diagnosis: Muscle weakness (generalized) (M62.81);History of falling (Z91.81);Difficulty in walking, not elsewhere classified (R26.2)     Time: 1010-1043 PT Time Calculation (min) (ACUTE ONLY): 33 min  Charges:  $Gait Training: 8-22 mins $Therapeutic Exercise: 8-22 mins                     Greggory Stallion, PT, DPT [phone]    Cassandra Joseph 08/11/2020, 1:53 PM

## 2020-08-11 NOTE — TOC Initial Note (Signed)
Transition of Care [facility]) - Initial/Assessment Note    Patient Details  Name: Cassandra Joseph MRN: [id number] Date of Birth: 22-Jan-1946  Transition of Care [facility] CM/SW Contact:    Magnus Ivan, LCSW Phone Number: 08/11/2020, 2:02 PM  Clinical Narrative:                CSW spoke to patient via phone due to Airborne Precautions. Patient reported she lives with her husband who is on hospice services so cannot help her at home. Her children provide transport when available, otherwise patient uses public transit. PCP is Dr. Ginette Pitman. [facility] [place] [place]. Patient has a RW and cane. Active with [facility] services. No SNF history. Patient goes to Dialysis MWF [place].   Discussed SNF recommendation. Patient agreeable to SNF. Explained that patient would not be able to go to a local SNF until 10 days after her positive COVID test, so January 10. Explained that if we find a SNF with a COVID bed available before then, patient would need to go to SNF and it would not be in [place]. Patient verbalized understanding. Patient prefers to stay local. CSW will follow for COVID bed availability, otherwise will complete SNF work up closer to 08/17/20.   Expected Discharge Plan: Skilled Nursing Facility Barriers to Discharge: Continued Medical Work up   Patient Goals and CMS Choice Patient states their goals for this hospitalization and ongoing recovery are:: SNF rehab CMS Medicare.gov Compare Post Acute Care list provided to:: Patient Choice offered to / list presented to : Patient  Expected Discharge Plan and Services Expected Discharge Plan: [place]       Living arrangements for the past 2 months: Single Family Home                                      Prior Living Arrangements/Services Living arrangements for the past 2 months: Single Family Home Lives with:: Spouse Patient language and need for interpreter reviewed:: Yes Do you feel safe  going back to the place where you live?: Yes      Need for Family Participation in Patient Care: Yes (Comment) Care giver support system in place?: Yes (comment) Current home services: DME,Home RN Criminal Activity/Legal Involvement Pertinent to Current Situation/Hospitalization: No - Comment as needed  Activities of Daily Living Home Assistive Devices/Equipment: Bedside commode/3-in-1,Walker (specify type) ADL Screening (condition at time of admission) Patient's cognitive ability adequate to safely complete daily activities?: Yes Is the patient deaf or have difficulty hearing?: No Does the patient have difficulty seeing, even when wearing glasses/contacts?: No Does the patient have difficulty concentrating, remembering, or making decisions?: No Patient able to express need for assistance with ADLs?: Yes Does the patient have difficulty dressing or bathing?: Yes Independently performs ADLs?: No Communication: Independent Dressing (OT): Needs assistance Is this a change from baseline?: Pre-admission baseline Grooming: Needs assistance Is this a change from baseline?: Pre-admission baseline Feeding: Independent with device (comment) Bathing: Needs assistance Is this a change from baseline?: Pre-admission baseline Toileting: Needs assistance Is this a change from baseline?: Pre-admission baseline In/Out Bed: Needs assistance Is this a change from baseline?: Pre-admission baseline Walks in Home: Needs assistance (Uses a walker) Is this a change from baseline?: Pre-admission baseline Does the patient have difficulty walking or climbing stairs?: Yes Weakness of Legs: Both Weakness of Arms/Hands: Both  Permission Sought/Granted Permission sought to  share information with : Chartered certified accountant granted to share information with : Yes, Verbal Permission Granted     Permission granted to share info w AGENCY: SNFs        Emotional Assessment       Orientation:  : Oriented to Self,Oriented to Place,Oriented to  Time,Oriented to Situation Alcohol / Substance Use: Not Applicable Psych Involvement: No (comment)  Admission diagnosis:  Morbid obesity ([place]) [E66.01] SOB (shortness of breath) [R06.02] Chronic respiratory failure with hypoxia (HCC) [J96.11] Generalized weakness [R53.1] ESRD on hemodialysis ([place]) [N18.6, Z99.2] Viral pneumonia [J12.9] COVID-19 virus infection [U07.1] Pneumonia due to COVID-19 virus [U07.1, J12.82] Patient Active Problem List   Diagnosis Date Noted  . Viral pneumonia 08/08/2020  . Pneumonia due to COVID-19 virus 08/07/2020  . History of anemia due to chronic kidney disease   . ESRD (end stage renal disease) ([place])   . SOB (shortness of breath)   . Acute pulmonary edema ([place]) 06/23/2019  . Acute on chronic diastolic CHF (congestive heart failure) ([place]) 01/18/2017  . HLD (hyperlipidemia) 01/18/2017  . Atypical chest pain 11/09/2016  . ESRD on hemodialysis ([place]) 11/09/2016  . Morbid obesity ([place]) 11/09/2016  . Essential hypertension 11/09/2016  . Multiple myeloma ([place]) 11/09/2016  . Elevated troponin 11/09/2016  . Metabolic syndrome 95/74/7340   PCP:  Tracie Harrier, MD [facility]   [id number] [id number]: [phone]: [phone]    Social Determinants of Health (SDOH) Interventions    Readmission Risk Interventions Readmission Risk Prevention Plan 08/11/2020 06/25/2019 05/14/2019  Transportation Screening Complete Complete Complete  PCP or Specialist Appt within 3-5 Days Complete Complete Complete  HRI or Home Care Consult Complete Complete Complete  Social Work Consult for [place]/Counseling Complete Complete Complete  Palliative Care Screening Not Applicable Not Applicable Not Applicable  Medication Review (Press photographer Complete Complete Complete  Some recent data might be hidden

## 2020-08-11 NOTE — Progress Notes (Signed)
*  PRELIMINARY RESULTS* Echocardiogram 2D Echocardiogram has been performed.  Cassandra Joseph 08/11/2020, 8:30 AM

## 2020-08-11 NOTE — Care Management Important Message (Signed)
Important Message  Patient Details  Name: Cassandra Joseph MRN: [id number] Date of Birth: [birth year]   Medicare Important Message Given:  N/A - LOS <3 / Initial given by admissions     Cassandra Joseph 08/11/2020, 11:50 AM

## 2020-08-11 NOTE — Progress Notes (Signed)
PROGRESS NOTE    Cassandra Joseph  ZTI:[id number] DOB: 1945-11-04 DOA: 08/07/2020 PCP: Tracie Harrier, MD   Assessment & Plan:   Active Problems:   Pneumonia due to COVID-19 virus   Viral pneumonia   COVID19 pneumonia: continues to improve. Continue on airborne & contact precautions. Continue on IV remdesivir and bronchodilators. Vaccinated but not boostered. Encourage incentive spirometry. Continue on supplemental oxygen    Leukopenia: resolved   ESRD: on HD MWF. 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 Will transfuse if Hb <7.0.  Generalized weakness: PT/OT recs SNF. Pt is agreeable to go to SNF  Chronic diastolic CHF: continue on home dose of carvedilol, torsemide.   Chronic hypoxic respiratory failure: chronically on 3L [place].   HLD: continue on statin   RA: not on any meds for RA   Likely PAF: continue on eliquis, carvedilol   Hx of TIA/CVA: continue on statin   Multiple myeloma: management per onco as an outpatient   Morbid obesity: BMI is 42.7. Complicates overall care and prognosis    DVT prophylaxis: eliquis  Code Status: full  Family Communication:  Disposition Plan: likely d/c to SNF  Status is: Inpatient  Remains inpatient appropriate because:Unsafe d/c plan and IV treatments appropriate due to intensity of illness or inability to take PO   Dispo: The patient is from: Home              Anticipated d/c is to: SNF              Anticipated d/c date is: whenever SNF bed is available               Patient currently is medically stable for d/c   Consultants:   nephro    Procedures:  Antimicrobials:   Subjective: Pt c/o fatigue   Objective: Vitals:   08/10/20 1556 08/10/20 2252 08/11/20 0002 08/11/20 0547  BP: (!) 130/57 106/85 (!) 142/49 128/80  Pulse: (!) 52 72 (!) 56 (!) 54  Resp: 20 18 16 15   Temp: 97.7 F (36.5 C) (!) 97.5 F (36.4 C) (!) 97.5 F (36.4 C) (!) 97.5 F (36.4 C)  TempSrc:    Oral  SpO2: 99%  100% 99% 100%  Weight:    122.2 kg  Height:        Intake/Output Summary (Last 24 hours) at 08/11/2020 0710 Last data filed at 08/10/2020 1500 Gross per 24 hour  Intake --  Output 1500 ml  Net -1500 ml   Filed Weights   08/07/20 1719 08/09/20 1822 08/11/20 0547  Weight: 120.2 kg 111.8 kg 122.2 kg    Examination:  General exam: Appears calm & comfortable. Morbidly obese Respiratory system: diminished breath sounds b/l  Cardiovascular system: S1/S2+. No clicks, rubs or gallops  Gastrointestinal system: Abd is soft, obese, NT & hypoactive bowel sounds  Central nervous system: Alert and oriented. Moves all 4 extremities  Psychiatry: Judgement and insight appear normal. Flat mood and affect      Data Reviewed: I have personally reviewed following labs and imaging studies  CBC: Recent Labs  Lab 08/07/20 1737 08/08/20 0016 08/09/20 0649 08/10/20 0726 08/11/20 0324  WBC 3.6*  3.6* 3.1* 3.0* 3.7* 5.0  NEUTROABS 1.6*  --   --   --   --   HGB 11.3*  10.9* 10.5* 10.6* 10.4* 10.5*  HCT 34.6*  34.7* 33.9* 33.7* 32.6* 33.5*  MCV 96.4  98.0 98.5 97.7 97.9 98.0  PLT 163  166 152 164  186 010   Basic Metabolic Panel: Recent Labs  Lab 08/07/20 1737 08/08/20 0016 08/09/20 0649 08/10/20 0726 08/10/20 0859 08/11/20 0324  NA 139 140 140 138  --  137  K 4.3 4.3 4.4 4.9  --  4.8  CL 96* 97* 98 97*  --  99  CO2 27 27 28 27   --  27  GLUCOSE 88 82 75 74  --  80  BUN 49* 51* 44* 60*  --  37*  CREATININE 8.90* 9.29* 7.81* 9.36*  --  6.50*  CALCIUM 9.8 9.6 9.1 8.4*  --  8.8*  PHOS  --   --   --   --  7.0*  --    GFR: Estimated Creatinine Clearance: 9.8 mL/min (A) (by C-G formula based on SCr of 6.5 mg/dL (H)). Liver Function Tests: Recent Labs  Lab 08/08/20 0016  AST 15  ALT 10  ALKPHOS 60  BILITOT 0.9  PROT 7.4  ALBUMIN 3.0*   No results for input(s): LIPASE, AMYLASE in the last 168 hours. No results for input(s): AMMONIA in the last 168 hours. Coagulation  Profile: No results for input(s): INR, PROTIME in the last 168 hours. Cardiac Enzymes: No results for input(s): CKTOTAL, CKMB, CKMBINDEX, TROPONINI in the last 168 hours. BNP (last 3 results) No results for input(s): PROBNP in the last 8760 hours. HbA1C: No results for input(s): HGBA1C in the last 72 hours. CBG: Recent Labs  Lab 08/10/20 0800 08/10/20 0958 08/10/20 1214  GLUCAP 70 146* 102*   Lipid Profile: No results for input(s): CHOL, HDL, LDLCALC, TRIG, CHOLHDL, LDLDIRECT in the last 72 hours. Thyroid Function Tests: No results for input(s): TSH, T4TOTAL, FREET4, T3FREE, THYROIDAB in the last 72 hours. Anemia Panel: No results for input(s): VITAMINB12, FOLATE, FERRITIN, TIBC, IRON, RETICCTPCT in the last 72 hours. Sepsis Labs: No results for input(s): PROCALCITON, LATICACIDVEN in the last 168 hours.  No results found for this or any previous visit (from the past 240 hour(s)).       Radiology Studies: No results found.      Scheduled Meds: . apixaban  2.5 mg Oral BID  . atorvastatin  80 mg Oral Daily  . carvedilol  25 mg Oral BID  . Chlorhexidine Gluconate Cloth  6 each Topical Q0600  . cinacalcet  60 mg Oral BID WC  . gabapentin  100 mg Oral BID  . midodrine  10 mg Oral TID WC  . multivitamin  1 tablet Oral QHS  . sevelamer carbonate  800 mg Oral TID WC  . sodium chloride flush  3 mL Intravenous Q12H  . torsemide  60 mg Oral Daily   Continuous Infusions: . sodium chloride    . sodium chloride    . remdesivir 100 mg in NS 100 mL       LOS: 3 days    Time spent: 30 mins    Wyvonnia Dusky, MD [facility] Pager [phone]  If 7PM-7AM, please contact night-coverage 08/11/2020, 7:10 AM

## 2020-08-11 NOTE — Progress Notes (Signed)
[facility]  ROUNDING NOTE   Subjective:   Hemodialysis treatment yesterday. Tolerated treatment well. Patient states she is subjectively better this morning.   UF of 1.5 liters   Objective:  Vital signs in last 24 hours:  Temp:  [97.4 F (36.3 C)-98 F (36.7 C)] 98 F (36.7 C) (01/04 1209) Pulse Rate:  [28-72] 51 (01/04 1209) Resp:  [13-23] 18 (01/04 1209) BP: (106-142)/(45-85) 118/50 (01/04 1209) SpO2:  [97 %-100 %] 100 % (01/04 1209) Weight:  [122.2 kg] 122.2 kg (01/04 0547)  Weight change: 10.3 kg Filed Weights   08/07/20 1719 08/09/20 1822 08/11/20 0547  Weight: 120.2 kg 111.8 kg 122.2 kg    Intake/Output: I/O last 3 completed shifts: In: -  Out: 1500 [Other:1500]   Intake/Output this shift:  No intake/output data recorded.  Physical Exam: General:  No acute distress  Head:  Normocephalic, atraumatic. Moist oral mucosal membranes  Eyes:  Anicteric  Neck:  Supple  Lungs:   clear   Heart:  irregular  Abdomen:   Soft, nondistended  Extremities:  No peripheral edema.  Neurologic:  Awake, alert, following commands  Skin:  No acute skin rash  Access:  Right upper extremity AVF    Basic Metabolic Panel: Recent Labs  Lab 08/07/20 1737 08/08/20 0016 08/09/20 0649 08/10/20 0726 08/10/20 0859 08/11/20 0324  NA 139 140 140 138  --  137  K 4.3 4.3 4.4 4.9  --  4.8  CL 96* 97* 98 97*  --  99  CO2 27 27 28 27   --  27  GLUCOSE 88 82 75 74  --  80  BUN 49* 51* 44* 60*  --  37*  CREATININE 8.90* 9.29* 7.81* 9.36*  --  6.50*  CALCIUM 9.8 9.6 9.1 8.4*  --  8.8*  PHOS  --   --   --   --  7.0*  --     Liver Function Tests: Recent Labs  Lab 08/08/20 0016  AST 15  ALT 10  ALKPHOS 60  BILITOT 0.9  PROT 7.4  ALBUMIN 3.0*   No results for input(s): LIPASE, AMYLASE in the last 168 hours. No results for input(s): AMMONIA in the last 168 hours.  CBC: Recent Labs  Lab 08/07/20 1737 08/08/20 0016 08/09/20 0649 08/10/20 0726 08/11/20 0324   WBC 3.6*  3.6* 3.1* 3.0* 3.7* 5.0  NEUTROABS 1.6*  --   --   --   --   HGB 11.3*  10.9* 10.5* 10.6* 10.4* 10.5*  HCT 34.6*  34.7* 33.9* 33.7* 32.6* 33.5*  MCV 96.4  98.0 98.5 97.7 97.9 98.0  PLT 163  166 152 164 186 180    Cardiac Enzymes: No results for input(s): CKTOTAL, CKMB, CKMBINDEX, TROPONINI in the last 168 hours.  BNP: Invalid input(s): POCBNP  CBG: Recent Labs  Lab 08/10/20 0800 08/10/20 0958 08/10/20 1214 08/11/20 0833  GLUCAP 70 146* 102* 32    Microbiology: Results for orders placed or performed during the hospital encounter of 06/23/19  SARS CORONAVIRUS 2 (TAT 6-24 HRS) Nasopharyngeal Nasopharyngeal Swab     Status: None   Collection Time: 06/23/19  7:36 PM   Specimen: Nasopharyngeal Swab  Result Value Ref Range Status   SARS Coronavirus 2 NEGATIVE NEGATIVE Final    Comment: (NOTE) SARS-CoV-2 target nucleic acids are NOT DETECTED. The SARS-CoV-2 RNA is generally detectable in upper and lower respiratory specimens during the acute phase of infection. Negative results do not preclude SARS-CoV-2 infection, do not rule  out co-infections with other pathogens, and should not be used as the sole basis for treatment or other patient management decisions. Negative results must be combined with clinical observations, patient history, and epidemiological information. The expected result is Negative. Fact Sheet for Patients: SugarRoll.be Fact Sheet for Healthcare Providers: https://www.woods-mathews.com/ This test is not yet approved or cleared by the [place] FDA and  has been authorized for detection and/or diagnosis of SARS-CoV-2 by FDA under an Emergency Use Authorization (EUA). This EUA will remain  in effect (meaning this test can be used) for the duration of the COVID-19 declaration under Section 56 4(b)(1) of the Act, 21 U.S.C. section 360bbb-3(b)(1), unless the authorization is terminated or revoked  sooner. Performed at [facility], [place] [address], [place], [place] [address]   MRSA PCR Screening     Status: None   Collection Time: 06/25/19  3:26 PM   Specimen: Nasopharyngeal  Result Value Ref Range Status   MRSA by PCR NEGATIVE NEGATIVE Final    Comment:        The GeneXpert MRSA Assay (FDA approved for NASAL specimens only), is one component of a comprehensive MRSA colonization surveillance program. It is not intended to diagnose MRSA infection nor to guide or monitor treatment for MRSA infections. Performed at  [facility], [address] [place], [place] [address]     Coagulation Studies: No results for input(s): LABPROT, INR in the last 72 hours.  Urinalysis: No results for input(s): COLORURINE, LABSPEC, PHURINE, GLUCOSEU, HGBUR, BILIRUBINUR, KETONESUR, PROTEINUR, UROBILINOGEN, NITRITE, LEUKOCYTESUR in the last 72 hours.  Invalid input(s): APPERANCEUR    Imaging: No results found.   Medications:   . sodium chloride    . sodium chloride    . remdesivir 100 mg in NS 100 mL 100 mg (08/11/20 0947)   . apixaban  2.5 mg Oral BID  . atorvastatin  80 mg Oral Daily  . carvedilol  25 mg Oral BID  . Chlorhexidine Gluconate Cloth  6 each Topical Q0600  . cinacalcet  60 mg Oral BID WC  . gabapentin  100 mg Oral BID  . midodrine  10 mg Oral TID WC  . multivitamin  1 tablet Oral QHS  . sevelamer carbonate  800 mg Oral TID WC  . sodium chloride flush  3 mL Intravenous Q12H  . torsemide  60 mg Oral Daily   sodium chloride, sodium chloride, acetaminophen **OR** acetaminophen, alteplase, guaiFENesin, heparin, Ipratropium-Albuterol, lidocaine (PF), lidocaine-prilocaine, ondansetron **OR** ondansetron (ZOFRAN) IV, pentafluoroprop-tetrafluoroeth, simethicone  Assessment/ Plan:   [name] is a 75 y.o. black female with end stage renal disease on hemodialysis, rheumatoid arthritis, CVA, congestive heart failure, multiple myeloma, hypertension,  hyperlipidemia, obstructive sleep apnea, who was admitted to [facility] on 08/07/2020 with COVID-19 pneumonia.  [facility] [address] MWF left AVF 122.5kg  1.  ESRD on HD MWF.  Tolerated dialysis treatment yesterday. Plan on next treatment for tomorrow.   2.  Anemia of chronic kidney disease. Hemoglobin 10.5 EPO as outpatient.   3.  Secondary hyperparathyroidism.   - Continue cinacalcet and sevelamer  4. Hypertension: with midodrine with dialysis treatments.  Currently with torsemide and carvedilol.    LOS: 3 Betha Shadix 1/4/202212:35 PM

## 2020-08-12 DIAGNOSIS — R531 Weakness: Secondary | ICD-10-CM | POA: Diagnosis not present

## 2020-08-12 DIAGNOSIS — Z992 Dependence on renal dialysis: Secondary | ICD-10-CM

## 2020-08-12 DIAGNOSIS — U071 COVID-19: Secondary | ICD-10-CM | POA: Diagnosis not present

## 2020-08-12 DIAGNOSIS — J9611 Chronic respiratory failure with hypoxia: Secondary | ICD-10-CM

## 2020-08-12 DIAGNOSIS — N186 End stage renal disease: Secondary | ICD-10-CM | POA: Diagnosis not present

## 2020-08-12 LAB — GLUCOSE, CAPILLARY
Glucose-Capillary: 100 mg/dL — ABNORMAL HIGH (ref 70–99)
Glucose-Capillary: 68 mg/dL — ABNORMAL LOW (ref 70–99)

## 2020-08-12 LAB — BASIC METABOLIC PANEL
Anion gap: 13 (ref 5–15)
BUN: 54 mg/dL — ABNORMAL HIGH (ref 8–23)
CO2: 26 mmol/L (ref 22–32)
Calcium: 9.4 mg/dL (ref 8.9–10.3)
Chloride: 99 mmol/L (ref 98–111)
Creatinine, Ser: 8.49 mg/dL — ABNORMAL HIGH (ref 0.44–1.00)
GFR, Estimated: 5 mL/min — ABNORMAL LOW (ref >60)
Glucose, Bld: 76 mg/dL (ref 70–99)
Potassium: 5 mmol/L (ref 3.5–5.1)
Sodium: 138 mmol/L (ref 135–145)

## 2020-08-12 LAB — CBC
HCT: 32.2 % — ABNORMAL LOW (ref 36.0–46.0)
Hemoglobin: 10.3 g/dL — ABNORMAL LOW (ref 12.0–15.0)
MCH: 31.2 pg (ref 26.0–34.0)
MCHC: 32 g/dL (ref 30.0–36.0)
MCV: 97.6 fL (ref 80.0–100.0)
Platelets: 181 10*3/uL (ref 150–400)
RBC: 3.3 MIL/uL — ABNORMAL LOW (ref 3.87–5.11)
RDW: 15.2 % (ref 11.5–15.5)
WBC: 4.9 10*3/uL (ref 4.0–10.5)
nRBC: 0 % (ref 0.0–0.2)

## 2020-08-12 LAB — C-REACTIVE PROTEIN: CRP: 9.1 mg/dL — ABNORMAL HIGH (ref <1.0)

## 2020-08-12 NOTE — Progress Notes (Signed)
[facility]  ROUNDING NOTE   Subjective:   Patient scheduled today for dialysis. She states she is feeling better.   Objective:  Vital signs in last 24 hours:  Temp:  [97.5 F (36.4 C)-98.6 F (37 C)] 97.5 F (36.4 C) (01/05 1212) Pulse Rate:  [46-64] 64 (01/05 1212) Resp:  [18-20] 18 (01/05 0815) BP: (128-176)/(64-99) 148/64 (01/05 1212) SpO2:  [95 %-100 %] 99 % (01/05 1212)  Weight change:  Filed Weights   08/07/20 1719 08/09/20 1822 08/11/20 0547  Weight: 120.2 kg 111.8 kg 122.2 kg    Intake/Output: I/O last 3 completed shifts: In: 240 [P.O.:240] Out: -    Intake/Output this shift:  No intake/output data recorded.  Physical Exam: General:  No acute distress  Head:  Normocephalic, atraumatic. Moist oral mucosal membranes  Eyes:  Anicteric  Neck:  Supple  Lungs:   clear   Heart:  irregular  Abdomen:   Soft, nondistended  Extremities:  No peripheral edema.  Neurologic:  Awake, alert, following commands  Skin:  No acute skin rash  Access:  Right upper extremity AVF    Basic Metabolic Panel: Recent Labs  Lab 08/08/20 0016 08/09/20 0649 08/10/20 0726 08/10/20 0859 08/11/20 0324 08/12/20 0342  NA 140 140 138  --  137 138  K 4.3 4.4 4.9  --  4.8 5.0  CL 97* 98 97*  --  99 99  CO2 27 28 27   --  27 26  GLUCOSE 82 75 74  --  80 76  BUN 51* 44* 60*  --  37* 54*  CREATININE 9.29* 7.81* 9.36*  --  6.50* 8.49*  CALCIUM 9.6 9.1 8.4*  --  8.8* 9.4  PHOS  --   --   --  7.0*  --   --     Liver Function Tests: Recent Labs  Lab 08/08/20 0016  AST 15  ALT 10  ALKPHOS 60  BILITOT 0.9  PROT 7.4  ALBUMIN 3.0*   No results for input(s): LIPASE, AMYLASE in the last 168 hours. No results for input(s): AMMONIA in the last 168 hours.  CBC: Recent Labs  Lab 08/07/20 1737 08/08/20 0016 08/09/20 0649 08/10/20 0726 08/11/20 0324 08/12/20 0342  WBC 3.6*  3.6* 3.1* 3.0* 3.7* 5.0 4.9  NEUTROABS 1.6*  --   --   --   --   --   HGB 11.3*  10.9*  10.5* 10.6* 10.4* 10.5* 10.3*  HCT 34.6*  34.7* 33.9* 33.7* 32.6* 33.5* 32.2*  MCV 96.4  98.0 98.5 97.7 97.9 98.0 97.6  PLT 163  166 152 164 186 180 181    Cardiac Enzymes: No results for input(s): CKTOTAL, CKMB, CKMBINDEX, TROPONINI in the last 168 hours.  BNP: Invalid input(s): POCBNP  CBG: Recent Labs  Lab 08/10/20 0958 08/10/20 1214 08/11/20 0833 08/12/20 0912 08/12/20 1232  GLUCAP 146* 102* 79 100* 39*    Microbiology: Results for orders placed or performed during the hospital encounter of 06/23/19  SARS CORONAVIRUS 2 (TAT 6-24 HRS) Nasopharyngeal Nasopharyngeal Swab     Status: None   Collection Time: 06/23/19  7:36 PM   Specimen: Nasopharyngeal Swab  Result Value Ref Range Status   SARS Coronavirus 2 NEGATIVE NEGATIVE Final    Comment: (NOTE) SARS-CoV-2 target nucleic acids are NOT DETECTED. The SARS-CoV-2 RNA is generally detectable in upper and lower respiratory specimens during the acute phase of infection. Negative results do not preclude SARS-CoV-2 infection, do not rule out co-infections with other pathogens,  and should not be used as the sole basis for treatment or other patient management decisions. Negative results must be combined with clinical observations, patient history, and epidemiological information. The expected result is Negative. Fact Sheet for Patients: SugarRoll.be Fact Sheet for Healthcare Providers: https://www.woods-mathews.com/ This test is not yet approved or cleared by the [place] FDA and  has been authorized for detection and/or diagnosis of SARS-CoV-2 by FDA under an Emergency Use Authorization (EUA). This EUA will remain  in effect (meaning this test can be used) for the duration of the COVID-19 declaration under Section 56 4(b)(1) of the Act, 21 U.S.C. section 360bbb-3(b)(1), unless the authorization is terminated or revoked sooner. Performed at [facility], [place]  [address], [place], [place] [address]   MRSA PCR Screening     Status: None   Collection Time: 06/25/19  3:26 PM   Specimen: Nasopharyngeal  Result Value Ref Range Status   MRSA by PCR NEGATIVE NEGATIVE Final    Comment:        The GeneXpert MRSA Assay (FDA approved for NASAL specimens only), is one component of a comprehensive MRSA colonization surveillance program. It is not intended to diagnose MRSA infection nor to guide or monitor treatment for MRSA infections. Performed at [facility], [address] [place], [place] [address]     Coagulation Studies: No results for input(s): LABPROT, INR in the last 72 hours.  Urinalysis: No results for input(s): COLORURINE, LABSPEC, PHURINE, GLUCOSEU, HGBUR, BILIRUBINUR, KETONESUR, PROTEINUR, UROBILINOGEN, NITRITE, LEUKOCYTESUR in the last 72 hours.  Invalid input(s): APPERANCEUR    Imaging: ECHOCARDIOGRAM COMPLETE  Result Date: 08/11/2020    ECHOCARDIOGRAM REPORT   Patient Name:   Cassandra Joseph Date of Exam: 08/11/2020 Medical Rec #:  [id number]        Height:       63.0 in Accession #:    [id number]       Weight:       269.3 lb Date of Birth:  [birth year]        BSA:          2.194 m Patient Age:    75 years         BP:           128/80 mmHg Patient Gender: F                HR:           45 bpm. Exam Location:  [facility] Procedure: 2D Echo, Color Doppler and Cardiac Doppler Indications:     I48.91 Atrial Fibrillation; R94.31 Abnormal ECG  History:         Patient has prior history of Echocardiogram examinations, most                  recent 06/24/2019. TIA and ESRD; Risk Factors:Hypertension and                  Dyslipidemia. Pt tested positive for COVID-19 on 08/07/20.  Sonographer:     Charmayne Sheer RDCS (AE) Referring Phys:  [id number] Wyvonnia Dusky Diagnosing Phys: Serafina Royals MD  Sonographer Comments: Suboptimal subcostal window. IMPRESSIONS  1. Left ventricular ejection fraction, by estimation, is 60 to 65%. The left ventricle has  normal function. The left ventricle has no regional wall motion abnormalities. Left ventricular diastolic parameters were normal.  2. Right ventricular systolic function is normal. The right ventricular size is normal.  3. Left atrial size was moderately dilated.  4. Right atrial size was mildly dilated.  5. The mitral valve is abnormal. Moderate mitral valve regurgitation. Mild mitral stenosis.  6. Tricuspid valve regurgitation is moderate.  7. The aortic valve is normal in structure. Aortic valve regurgitation is mild. Mild aortic valve sclerosis is present, with no evidence of aortic valve stenosis. FINDINGS  Left Ventricle: Left ventricular ejection fraction, by estimation, is 60 to 65%. The left ventricle has normal function. The left ventricle has no regional wall motion abnormalities. The left ventricular internal cavity size was normal in size. There is  no left ventricular hypertrophy. Left ventricular diastolic parameters were normal. Right Ventricle: The right ventricular size is normal. No increase in right ventricular wall thickness. Right ventricular systolic function is normal. Left Atrium: Left atrial size was moderately dilated. Right Atrium: Right atrial size was mildly dilated. Pericardium: There is no evidence of pericardial effusion. Mitral Valve: The mitral valve is abnormal. Moderate mitral valve regurgitation. Mild mitral valve stenosis. MV peak gradient, 20.8 mmHg. The mean mitral valve gradient is 6.0 mmHg. Tricuspid Valve: The tricuspid valve is normal in structure. Tricuspid valve regurgitation is moderate. Aortic Valve: The aortic valve is normal in structure. Aortic valve regurgitation is mild. Mild aortic valve sclerosis is present, with no evidence of aortic valve stenosis. Aortic valve mean gradient measures 7.0 mmHg. Aortic valve peak gradient measures 15.0 mmHg. Aortic valve area, by VTI measures 2.03 cm. Pulmonic Valve: The pulmonic valve was normal in structure. Pulmonic valve  regurgitation is not visualized. Aorta: The aortic root and ascending aorta are structurally normal, with no evidence of dilitation. IAS/Shunts: No atrial level shunt detected by color flow Doppler.  LEFT VENTRICLE PLAX 2D LVIDd:         4.50 cm  Diastology LVIDs:         3.30 cm  LV e' lateral:   6.09 cm/s LV PW:         1.40 cm  LV E/e' lateral: 30.5 LV IVS:        1.10 cm LVOT diam:     2.20 cm LV SV:         84 LV SV Index:   38 LVOT Area:     3.80 cm  RIGHT VENTRICLE RV Basal diam:  4.70 cm TAPSE (M-mode): 2.2 cm LEFT ATRIUM            Index       RIGHT ATRIUM           Index LA diam:      5.90 cm  2.69 cm/m  RA Area:     22.80 cm LA Vol (A4C): 125.0 ml 56.97 ml/m RA Volume:   75.10 ml  34.23 ml/m  AORTIC VALVE                    PULMONIC VALVE AV Area (Vmax):    2.04 cm     PV Vmax:       1.42 m/s AV Area (Vmean):   2.03 cm     PV Vmean:      83.100 cm/s AV Area (VTI):     2.03 cm     PV VTI:        0.330 m AV Vmax:           193.50 cm/s  PV Peak grad:  8.1 mmHg AV Vmean:          125.000 cm/s PV Mean grad:  3.0 mmHg AV VTI:  0.413 m AV Peak Grad:      15.0 mmHg AV Mean Grad:      7.0 mmHg LVOT Vmax:         104.00 cm/s LVOT Vmean:        66.900 cm/s LVOT VTI:          0.221 m LVOT/AV VTI ratio: 0.54  AORTA Ao Root diam: 3.10 cm MITRAL VALVE MV Area (PHT): 2.36 cm     SHUNTS MV Peak grad:  20.8 mmHg    Systemic VTI:  0.22 m MV Mean grad:  6.0 mmHg     Systemic Diam: 2.20 cm MV Vmax:       2.28 m/s MV Vmean:      111.0 cm/s MV Decel Time: 321 msec MV E velocity: 186.00 cm/s Serafina Royals MD Electronically signed by Serafina Royals MD Signature Date/Time: 08/11/2020/5:08:10 PM    Final      Medications:   . sodium chloride    . sodium chloride     . apixaban  2.5 mg Oral BID  . atorvastatin  80 mg Oral Daily  . carvedilol  25 mg Oral BID  . Chlorhexidine Gluconate Cloth  6 each Topical Q0600  . cinacalcet  60 mg Oral BID WC  . gabapentin  100 mg Oral BID  . midodrine  10 mg Oral  TID WC  . multivitamin  1 tablet Oral QHS  . sevelamer carbonate  800 mg Oral TID WC  . sodium chloride flush  3 mL Intravenous Q12H  . torsemide  60 mg Oral Daily   sodium chloride, sodium chloride, acetaminophen **OR** acetaminophen, alteplase, guaiFENesin, heparin, Ipratropium-Albuterol, lidocaine (PF), lidocaine-prilocaine, ondansetron **OR** ondansetron (ZOFRAN) IV, pentafluoroprop-tetrafluoroeth, simethicone  Assessment/ Plan:   Cassandra Joseph is a 75 y.o. black female with end stage renal disease on hemodialysis, rheumatoid arthritis, CVA, congestive heart failure, multiple myeloma, hypertension, hyperlipidemia, obstructive sleep apnea, who was admitted to [facility] on 08/07/2020 with COVID-19 pneumonia.  [facility] [address] MWF left AVF 122.5kg  1.  ESRD on HD MWF.  Dialysis for today and then transition to TTS schedule for outpatient COVID shift.    2.  Anemia of chronic kidney disease. Hemoglobin 10.3 EPO as outpatient.   3.  Secondary hyperparathyroidism.   - Continue cinacalcet and sevelamer  4. Hypertension: with midodrine with dialysis treatments.  Currently with torsemide and carvedilol.    LOS: [place] 1/5/20223:14 PM

## 2020-08-12 NOTE — Plan of Care (Signed)

## 2020-08-12 NOTE — Progress Notes (Signed)
PROGRESS NOTE  Cassandra Joseph JDB:[id number] DOB: 1945-11-09 DOA: 08/07/2020 PCP: Tracie Harrier, MD   LOS: 4 days   Brief Narrative / Interim history: 75 year old female with ESRD on HD MWF, HTN, HLD, RA, prior CVA, multiple myeloma, sleep apnea on CPAP, persistent A. fib on Eliquis, chronic diastolic CHF, chronic hypoxia with 3 L baseline, came into the hospital with fatigue, weakness, pleuritic chest pain.  Chest x-ray did have infiltrates but she was not more hypoxic than her baseline  Subjective / 24h Interval events: Feels very weak but overall is doing well.  Feels better.  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 Management per nephro  Anemia of chronic kidney disease -likely secondary ESRD. Will transfuse if Hb <7.0.  Generalized weakness -PT/OT recs SNF. Pt is agreeable to go to SNF  Chronic diastolic CHF -continue on home dose of carvedilol, torsemide.   HLD -continue on statin   RA -not on any meds for RA   Likely PAF -continue on eliquis, carvedilol   Hx ofTIA/CVA -continue on statin   Multiple myeloma -management per onco as an outpatient   Morbid obesity: BMI is 61.2 -Complicates overall care and prognosis   Scheduled Meds: . apixaban  2.5 mg Oral BID  . atorvastatin  80 mg Oral Daily  . carvedilol  25 mg Oral BID  . Chlorhexidine  Gluconate Cloth  6 each Topical Q0600  . cinacalcet  60 mg Oral BID WC  . gabapentin  100 mg Oral BID  . midodrine  10 mg Oral TID WC  . multivitamin  1 tablet Oral QHS  . sevelamer carbonate  800 mg Oral TID WC  . sodium chloride flush  3 mL Intravenous Q12H  . torsemide  60 mg Oral Daily   Continuous Infusions: . sodium chloride    . sodium chloride    . remdesivir 100 mg in NS 100 mL 100 mg (08/11/20 0947)   PRN Meds:.sodium chloride, sodium chloride, acetaminophen **OR** acetaminophen, alteplase, guaiFENesin, heparin, Ipratropium-Albuterol, lidocaine (PF), lidocaine-prilocaine, ondansetron **OR** ondansetron (ZOFRAN) IV, pentafluoroprop-tetrafluoroeth, simethicone  DVT prophylaxis: Eliquis Code Status: Full code Family Communication: d/w patient    Status is: Inpatient  Remains inpatient appropriate because:Unsafe d/c plan   Dispo: The patient is from: Home              Anticipated d/c is to: SNF              Anticipated d/c date is: 1 day              Patient currently is medically stable to d/c.  Consultants:  Nephrology   Procedures:  None   Microbiology: None   Antibacterials: None    Objective: Vitals:   08/12/20 0031 08/12/20 0556 08/12/20 0815 08/12/20 1212  BP: 137/73 128/66 (!) 155/99 (!) 148/64  Pulse: 61 (!) 52 63 64  Resp: 20 20 18  Temp: 98.6 F (37 C) 98.6 F (37 C) 97.6 F (36.4 C) (!) 97.5 F (36.4 C)  TempSrc:    Oral  SpO2: 98% 100% 95% 99%  Weight:      Height:       No intake or output data in the 24 hours ending 08/12/20 1353 Filed Weights   08/07/20 1719 08/09/20 1822 08/11/20 0547  Weight: 120.2 kg 111.8 kg 122.2 kg    Examination:  Constitutional: NAD Eyes: no scleral icterus ENMT: Mucous membranes are moist.  Neck: normal, supple Respiratory: clear to auscultation bilaterally, no wheezing, no crackles. Normal respiratory effort.  Cardiovascular: Regular rate and rhythm, no murmurs / rubs / gallops. No LE edema.   Abdomen: non distended, no tenderness. Bowel sounds positive.  Musculoskeletal: no clubbing / cyanosis.  Skin: no rashes Neurologic: non focal   Data Reviewed: I have independently reviewed following labs and imaging studies   CBC: Recent Labs  Lab 08/07/20 1737 08/08/20 0016 08/09/20 0649 08/10/20 0726 08/11/20 0324 08/12/20 0342  WBC 3.6*  3.6* 3.1* 3.0* 3.7* 5.0 4.9  NEUTROABS 1.6*  --   --   --   --   --   HGB 11.3*  10.9* 10.5* 10.6* 10.4* 10.5* 10.3*  HCT 34.6*  34.7* 33.9* 33.7* 32.6* 33.5* 32.2*  MCV 96.4  98.0 98.5 97.7 97.9 98.0 97.6  PLT 163  166 152 164 186 180 027   Basic Metabolic Panel: Recent Labs  Lab 08/08/20 0016 08/09/20 0649 08/10/20 0726 08/10/20 0859 08/11/20 0324 08/12/20 0342  NA 140 140 138  --  137 138  K 4.3 4.4 4.9  --  4.8 5.0  CL 97* 98 97*  --  99 99  CO2 27 28 27   --  27 26  GLUCOSE 82 75 74  --  80 76  BUN 51* 44* 60*  --  37* 54*  CREATININE 9.29* 7.81* 9.36*  --  6.50* 8.49*  CALCIUM 9.6 9.1 8.4*  --  8.8* 9.4  PHOS  --   --   --  7.0*  --   --    GFR: Estimated Creatinine Clearance: 7.5 mL/min (A) (by C-G formula based on SCr of 8.49 mg/dL (H)). Liver Function Tests: Recent Labs  Lab 08/08/20 0016  AST 15  ALT 10  ALKPHOS 60  BILITOT 0.9  PROT 7.4  ALBUMIN 3.0*   No results for input(s): LIPASE, AMYLASE in the last 168 hours. No results for input(s): AMMONIA in the last 168 hours. Coagulation Profile: No results for input(s): INR, PROTIME in the last 168 hours. Cardiac Enzymes: No results for input(s): CKTOTAL, CKMB, CKMBINDEX, TROPONINI in the last 168 hours. BNP (last 3 results) No results for input(s): PROBNP in the last 8760 hours. HbA1C: No results for input(s): HGBA1C in the last 72 hours. CBG: Recent Labs  Lab 08/10/20 0958 08/10/20 1214 08/11/20 0833 08/12/20 0912 08/12/20 1232  GLUCAP 146* 102* 79 100* 68*   Lipid Profile: No results for input(s): CHOL, HDL, LDLCALC, TRIG, CHOLHDL,  LDLDIRECT in the last 72 hours. Thyroid Function Tests: No results for input(s): TSH, T4TOTAL, FREET4, T3FREE, THYROIDAB in the last 72 hours. Anemia Panel: No results for input(s): VITAMINB12, FOLATE, FERRITIN, TIBC, IRON, RETICCTPCT in the last 72 hours. Urine analysis: No results found for: COLORURINE, APPEARANCEUR, LABSPEC, PHURINE, GLUCOSEU, HGBUR, BILIRUBINUR, KETONESUR, PROTEINUR, UROBILINOGEN, NITRITE, LEUKOCYTESUR Sepsis Labs: Invalid input(s): PROCALCITONIN, LACTICIDVEN  No results found for this or any previous visit (from the past 240 hour(s)).  Radiology Studies: ECHOCARDIOGRAM COMPLETE  Result Date: 08/11/2020    ECHOCARDIOGRAM REPORT   Patient Name:   MARLIN BRYS Date of Exam: 08/11/2020 Medical Rec #:  [id number]        Height:       63.0 in Accession #:    [id number]       Weight:       269.3 lb Date of Birth:  10/26/1945        BSA:          2.194 m Patient Age:    25 years         BP:           128/80 mmHg Patient Gender: F                HR:           45 bpm. Exam Location:  [facility] Procedure: 2D Echo, Color Doppler and Cardiac Doppler Indications:     I48.91 Atrial Fibrillation; R94.31 Abnormal ECG  History:         Patient has prior history of Echocardiogram examinations, most                  recent 06/24/2019. TIA and ESRD; Risk Factors:Hypertension and                  Dyslipidemia. Pt tested positive for COVID-19 on 08/07/20.  Sonographer:     Charmayne Sheer RDCS (AE) Referring Phys:  [id number] Wyvonnia Dusky Diagnosing Phys: Serafina Royals MD  Sonographer Comments: Suboptimal subcostal window. IMPRESSIONS  1. Left ventricular ejection fraction, by estimation, is 60 to 65%. The left ventricle has normal function. The left ventricle has no regional wall motion abnormalities. Left ventricular diastolic parameters were normal.  2. Right ventricular systolic function is normal. The right ventricular size is normal.  3. Left atrial size was moderately dilated.  4. Right atrial size  was mildly dilated.  5. The mitral valve is abnormal. Moderate mitral valve regurgitation. Mild mitral stenosis.  6. Tricuspid valve regurgitation is moderate.  7. The aortic valve is normal in structure. Aortic valve regurgitation is mild. Mild aortic valve sclerosis is present, with no evidence of aortic valve stenosis. FINDINGS  Left Ventricle: Left ventricular ejection fraction, by estimation, is 60 to 65%. The left ventricle has normal function. The left ventricle has no regional wall motion abnormalities. The left ventricular internal cavity size was normal in size. There is  no left ventricular hypertrophy. Left ventricular diastolic parameters were normal. Right Ventricle: The right ventricular size is normal. No increase in right ventricular wall thickness. Right ventricular systolic function is normal. Left Atrium: Left atrial size was moderately dilated. Right Atrium: Right atrial size was mildly dilated. Pericardium: There is no evidence of pericardial effusion. Mitral Valve: The mitral valve is abnormal. Moderate mitral valve regurgitation. Mild mitral valve stenosis. MV peak gradient, 20.8 mmHg. The mean mitral valve gradient is 6.0 mmHg. Tricuspid Valve: The tricuspid valve is normal in structure. Tricuspid valve regurgitation is moderate. Aortic Valve: The aortic valve is normal in structure. Aortic valve regurgitation is mild. Mild aortic valve sclerosis is present, with no evidence of aortic valve stenosis. Aortic valve mean gradient measures 7.0 mmHg. Aortic valve peak gradient measures 15.0 mmHg. Aortic valve area, by VTI measures 2.03 cm. Pulmonic Valve: The pulmonic valve was normal in structure. Pulmonic valve regurgitation is not visualized. Aorta: The aortic root and ascending aorta are structurally normal, with no evidence of  dilitation. IAS/Shunts: No atrial level shunt detected by color flow Doppler.  LEFT VENTRICLE PLAX 2D LVIDd:         4.50 cm  Diastology LVIDs:         3.30 cm  LV e'  lateral:   6.09 cm/s LV PW:         1.40 cm  LV E/e' lateral: 30.5 LV IVS:        1.10 cm LVOT diam:     2.20 cm LV SV:         84 LV SV Index:   38 LVOT Area:     3.80 cm  RIGHT VENTRICLE RV Basal diam:  4.70 cm TAPSE (M-mode): 2.2 cm LEFT ATRIUM            Index       RIGHT ATRIUM           Index LA diam:      5.90 cm  2.69 cm/m  RA Area:     22.80 cm LA Vol (A4C): 125.0 ml 56.97 ml/m RA Volume:   75.10 ml  34.23 ml/m  AORTIC VALVE                    PULMONIC VALVE AV Area (Vmax):    2.04 cm     PV Vmax:       1.42 m/s AV Area (Vmean):   2.03 cm     PV Vmean:      83.100 cm/s AV Area (VTI):     2.03 cm     PV VTI:        0.330 m AV Vmax:           193.50 cm/s  PV Peak grad:  8.1 mmHg AV Vmean:          125.000 cm/s PV Mean grad:  3.0 mmHg AV VTI:            0.413 m AV Peak Grad:      15.0 mmHg AV Mean Grad:      7.0 mmHg LVOT Vmax:         104.00 cm/s LVOT Vmean:        66.900 cm/s LVOT VTI:          0.221 m LVOT/AV VTI ratio: 0.54  AORTA Ao Root diam: 3.10 cm MITRAL VALVE MV Area (PHT): 2.36 cm     SHUNTS MV Peak grad:  20.8 mmHg    Systemic VTI:  0.22 m MV Mean grad:  6.0 mmHg     Systemic Diam: 2.20 cm MV Vmax:       2.28 m/s MV Vmean:      111.0 cm/s MV Decel Time: 321 msec MV E velocity: 186.00 cm/s Serafina Royals MD Electronically signed by Serafina Royals MD Signature Date/Time: 08/11/2020/5:08:10 PM    Final      Marzetta Board, MD, PhD [facility]  Between 7 am - 7 pm I am available, please contact me via Amion or Securechat  Between 7 pm - 7 am I am not available, please contact night coverage MD/APP via Amion

## 2020-08-12 NOTE — Progress Notes (Signed)
Hemodialysis patient known at [facility] MWF 5:45. Due to COVID pos patient will treat at [facility] 12:45. Please note that patient is responsible to get self to treatments. Please contact me when patient is discharge ready to make arrangements.  [name] [facility] [phone]

## 2020-08-13 DIAGNOSIS — N186 End stage renal disease: Secondary | ICD-10-CM | POA: Diagnosis not present

## 2020-08-13 DIAGNOSIS — J9611 Chronic respiratory failure with hypoxia: Secondary | ICD-10-CM | POA: Diagnosis not present

## 2020-08-13 DIAGNOSIS — U071 COVID-19: Secondary | ICD-10-CM | POA: Diagnosis not present

## 2020-08-13 DIAGNOSIS — R531 Weakness: Secondary | ICD-10-CM | POA: Diagnosis not present

## 2020-08-13 LAB — GLUCOSE, CAPILLARY
Glucose-Capillary: 79 mg/dL (ref 70–99)
Glucose-Capillary: 82 mg/dL (ref 70–99)

## 2020-08-13 LAB — C-REACTIVE PROTEIN: CRP: 7.6 mg/dL — ABNORMAL HIGH (ref <1.0)

## 2020-08-13 NOTE — Progress Notes (Signed)
PROGRESS NOTE  Cassandra Joseph:[id number] DOB: 10/28/1945 DOA: 08/07/2020 PCP: Tracie Harrier, MD   LOS: 5 days   Brief Narrative / Interim history: 75 year old female with ESRD on HD MWF, HTN, HLD, RA, prior CVA, multiple myeloma, sleep apnea on CPAP, persistent A. fib on Eliquis, chronic diastolic CHF, chronic hypoxia with 3 L baseline, came into the hospital with fatigue, weakness, pleuritic chest pain.  Chest x-ray did have infiltrates but she was not more hypoxic than her baseline  Subjective / 24h Interval events: She is concerned about her dialysis as it was not done yesterday and feels a little more swollen today in her hands and her face.  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 Management per nephro, did not get dialysis yesterday supposed to get today  Anemia of chronic kidney disease -likely secondary ESRD. Will transfuse if Hb <7.0.  Hemoglobin stable  Generalized weakness -PT/OT recs SNF. Pt is agreeable to go to SNF, placement likely on Monday  Chronic diastolic CHF -continue on home dose of carvedilol, torsemide.   Fluid management per HD  HLD -continue on statin   RA -not on any meds for RA   Likely PAF -continue on eliquis, carvedilol   Hx ofTIA/CVA -continue on statin   Multiple myeloma -management per onc as an outpatient    Morbid obesity: BMI is 73.2 -Complicates overall care and prognosis   Scheduled Meds: . apixaban  2.5 mg Oral BID  . atorvastatin  80 mg Oral Daily  . carvedilol  25 mg Oral BID  . Chlorhexidine Gluconate Cloth  6 each Topical Q0600  . cinacalcet  60 mg Oral BID WC  . gabapentin  100 mg Oral BID  . midodrine  10 mg Oral TID WC  . multivitamin  1 tablet Oral QHS  . sevelamer carbonate  800 mg Oral TID WC  . sodium chloride flush  3 mL Intravenous Q12H  . torsemide  60 mg Oral Daily   Continuous Infusions: . sodium chloride    . sodium chloride     PRN Meds:.sodium chloride, sodium chloride, acetaminophen **OR** acetaminophen, alteplase, guaiFENesin, heparin, Ipratropium-Albuterol, lidocaine (PF), lidocaine-prilocaine, ondansetron **OR** ondansetron (ZOFRAN) IV, pentafluoroprop-tetrafluoroeth, simethicone  DVT prophylaxis: Eliquis Code Status: Full code Family Communication: d/w patient    Status is: Inpatient  Remains inpatient appropriate because:Unsafe d/c plan   Dispo: The patient is from: Home              Anticipated d/c is to: SNF              Anticipated d/c date is: 1 day              Patient currently is medically stable to d/c.  Consultants:  Nephrology   Procedures:  None   Microbiology: None   Antibacterials: None    Objective: Vitals:   08/13/20 1230 08/13/20 1245 08/13/20 1300  08/13/20 1315  BP: (!) 159/70 (!) 177/72 (!) 173/86   Pulse: (!) 54 (!) 57 (!) 56   Resp: 17 18 (!) 22 17  Temp:      TempSrc:      SpO2: 100% 100% 100% 100%  Weight:      Height:        Intake/Output Summary (Last 24 hours) at 08/13/2020 1357 Last data filed at 08/12/2020 2100 Gross per 24 hour  Intake 220 ml  Output 0 ml  Net 220 ml   Filed Weights   08/09/20 1822 08/11/20 0547 08/13/20 0500  Weight: 111.8 kg 122.2 kg 123.2 kg    Examination:  Constitutional: nad Eyes: no icterus  ENMT: mmm Neck: normal, supple Respiratory: cta biL, no wheezing, no  crackles  Cardiovascular: rrr, no mrg  Abdomen: nt, nd, bs+ Musculoskeletal: no clubbing / cyanosis.  Skin: no rashes Neurologic: non focal   Data Reviewed: I have independently reviewed following labs and imaging studies   CBC: Recent Labs  Lab 08/07/20 1737 08/08/20 0016 08/09/20 0649 08/10/20 0726 08/11/20 0324 08/12/20 0342  WBC 3.6*  3.6* 3.1* 3.0* 3.7* 5.0 4.9  NEUTROABS 1.6*  --   --   --   --   --   HGB 11.3*  10.9* 10.5* 10.6* 10.4* 10.5* 10.3*  HCT 34.6*  34.7* 33.9* 33.7* 32.6* 33.5* 32.2*  MCV 96.4  98.0 98.5 97.7 97.9 98.0 97.6  PLT 163  166 152 164 186 180 951   Basic Metabolic Panel: Recent Labs  Lab 08/08/20 0016 08/09/20 0649 08/10/20 0726 08/10/20 0859 08/11/20 0324 08/12/20 0342  NA 140 140 138  --  137 138  K 4.3 4.4 4.9  --  4.8 5.0  CL 97* 98 97*  --  99 99  CO2 _0 --  27 26  GLUCOSE 82 75 74  --  80 76  BUN 51* 44* 60*  --  37* 54*  CREATININE 9.29* 7.81* 9.36*  --  6.50* 8.49*  CALCIUM 9.6 9.1 8.4*  --  8.8* 9.4  PHOS  --   --   --  7.0*  --   --    GFR: Estimated Creatinine Clearance: 7.5 mL/min (A) (by C-G formula based on SCr of 8.49 mg/dL (H)). Liver Function Tests: Recent Labs  Lab 08/08/20 0016  AST 15  ALT 10  ALKPHOS 60  BILITOT 0.9  PROT 7.4  ALBUMIN 3.0*   No results for input(s): LIPASE, AMYLASE in the last 168 hours. No results for input(s): AMMONIA in the last 168 hours. Coagulation Profile: No results for input(s): INR, PROTIME in the last 168 hours. Cardiac Enzymes: No results for input(s): CKTOTAL, CKMB, CKMBINDEX, TROPONINI in the last 168 hours. BNP (last 3 results) No results for input(s): PROBNP in the last 8760 hours. HbA1C: No results for input(s): HGBA1C in the last 72 hours. CBG: Recent Labs  Lab 08/10/20 1214 08/11/20 0833 08/12/20 0912 08/12/20 1232 08/13/20 0018  GLUCAP 102* 79 100* 68* 79   Lipid Profile: No results for input(s): CHOL, HDL, LDLCALC, TRIG, CHOLHDL, LDLDIRECT in  the last 72 hours. Thyroid Function Tests: No results for input(s): TSH, T4TOTAL, FREET4, T3FREE, THYROIDAB in the last 72 hours. Anemia Panel: No results for input(s): VITAMINB12, FOLATE, FERRITIN, TIBC, IRON, RETICCTPCT in the last 72 hours. Urine analysis: No results found for: COLORURINE, APPEARANCEUR, LABSPEC, PHURINE, GLUCOSEU, HGBUR, BILIRUBINUR, KETONESUR, PROTEINUR, UROBILINOGEN, NITRITE, LEUKOCYTESUR Sepsis Labs: Invalid input(s): PROCALCITONIN, LACTICIDVEN  No results  found for this or any previous visit (from the past 240 hour(s)).    Radiology Studies: No results found.   Marzetta Board, MD, PhD [facility]  Between 7 am - 7 pm I am available, please contact me via Amion or Securechat  Between 7 pm - 7 am I am not available, please contact night coverage MD/APP via Amion

## 2020-08-13 NOTE — Progress Notes (Signed)
Noted patients heart rate brady at this time. Dr. Juleen China made aware and currently at bedside. Patient asymptomatic. Will continue to monitor.

## 2020-08-13 NOTE — Progress Notes (Signed)
[facility]  ROUNDING NOTE   Subjective:   Seen and examined on hemodialysis treatment. Tolerating treatment well.     HEMODIALYSIS FLOWSHEET:  Blood Flow Rate (mL/min): 200 mL/min Arterial Pressure (mmHg): -190 mmHg Venous Pressure (mmHg): 150 mmHg Transmembrane Pressure (mmHg): 50 mmHg Ultrafiltration Rate (mL/min): 1000 mL/min Dialysate Flow Rate (mL/min): 600 ml/min Conductivity: Machine : 14.2 Conductivity: Machine : 14.2 Dialysis Fluid Bolus: Normal Saline Bolus Amount (mL): 200 mL    Objective:  Vital signs in last 24 hours:  Temp:  [97.4 F (36.3 C)-98.2 F (36.8 C)] 98.1 F (36.7 C) (01/06 1050) Pulse Rate:  [39-145] 58 (01/06 1320) Resp:  [12-42] 18 (01/06 1320) BP: (117-177)/(51-103) 150/74 (01/06 1320) SpO2:  [85 %-100 %] 100 % (01/06 1320) Weight:  [123.2 kg] 123.2 kg (01/06 0500)  Weight change:  Filed Weights   08/09/20 1822 08/11/20 0547 08/13/20 0500  Weight: 111.8 kg 122.2 kg 123.2 kg    Intake/Output: I/O last 3 completed shifts: In: 220 [P.O.:120; IV Piggyback:100] Out: 0    Intake/Output this shift:  Total I/O In: -  Out: 2000 [Other:2000]  Physical Exam: General:  No acute distress  Head:  Normocephalic, atraumatic. Moist oral mucosal membranes  Eyes:  Anicteric  Neck:  Supple  Lungs:   clear   Heart:  irregular, bradycardia  Abdomen:   Soft, nondistended  Extremities:  No peripheral edema.  Neurologic:  Awake, alert, following commands  Skin:  No acute skin rash  Access:  Right upper extremity AVF    Basic Metabolic Panel: Recent Labs  Lab 08/08/20 0016 08/09/20 0649 08/10/20 0726 08/10/20 0859 08/11/20 0324 08/12/20 0342  NA 140 140 138  --  137 138  K 4.3 4.4 4.9  --  4.8 5.0  CL 97* 98 97*  --  99 99  CO2 27 28 27   --  27 26  GLUCOSE 82 75 74  --  80 76  BUN 51* 44* 60*  --  37* 54*  CREATININE 9.29* 7.81* 9.36*  --  6.50* 8.49*  CALCIUM 9.6 9.1 8.4*  --  8.8* 9.4  PHOS  --   --   --  7.0*  --    --     Liver Function Tests: Recent Labs  Lab 08/08/20 0016  AST 15  ALT 10  ALKPHOS 60  BILITOT 0.9  PROT 7.4  ALBUMIN 3.0*   No results for input(s): LIPASE, AMYLASE in the last 168 hours. No results for input(s): AMMONIA in the last 168 hours.  CBC: Recent Labs  Lab 08/07/20 1737 08/08/20 0016 08/09/20 0649 08/10/20 0726 08/11/20 0324 08/12/20 0342  WBC 3.6*  3.6* 3.1* 3.0* 3.7* 5.0 4.9  NEUTROABS 1.6*  --   --   --   --   --   HGB 11.3*  10.9* 10.5* 10.6* 10.4* 10.5* 10.3*  HCT 34.6*  34.7* 33.9* 33.7* 32.6* 33.5* 32.2*  MCV 96.4  98.0 98.5 97.7 97.9 98.0 97.6  PLT 163  166 152 164 186 180 181    Cardiac Enzymes: No results for input(s): CKTOTAL, CKMB, CKMBINDEX, TROPONINI in the last 168 hours.  BNP: Invalid input(s): POCBNP  CBG: Recent Labs  Lab 08/10/20 1214 08/11/20 0833 08/12/20 0912 08/12/20 1232 08/13/20 0018  GLUCAP 102* 79 100* 68* 79    Microbiology: Results for orders placed or performed during the hospital encounter of 06/23/19  SARS CORONAVIRUS 2 (TAT 6-24 HRS) Nasopharyngeal Nasopharyngeal Swab     Status: None  Collection Time: 06/23/19  7:36 PM   Specimen: Nasopharyngeal Swab  Result Value Ref Range Status   SARS Coronavirus 2 NEGATIVE NEGATIVE Final    Comment: (NOTE) SARS-CoV-2 target nucleic acids are NOT DETECTED. The SARS-CoV-2 RNA is generally detectable in upper and lower respiratory specimens during the acute phase of infection. Negative results do not preclude SARS-CoV-2 infection, do not rule out co-infections with other pathogens, and should not be used as the sole basis for treatment or other patient management decisions. Negative results must be combined with clinical observations, patient history, and epidemiological information. The expected result is Negative. Fact Sheet for Patients: SugarRoll.be Fact Sheet for Healthcare  Providers: https://www.woods-mathews.com/ This test is not yet approved or cleared by the [place] FDA and  has been authorized for detection and/or diagnosis of SARS-CoV-2 by FDA under an Emergency Use Authorization (EUA). This EUA will remain  in effect (meaning this test can be used) for the duration of the COVID-19 declaration under Section 56 4(b)(1) of the Act, 21 U.S.C. section 360bbb-3(b)(1), unless the authorization is terminated or revoked sooner. Performed at [facility], [place] [address], [place], [place] [address]   MRSA PCR Screening     Status: None   Collection Time: 06/25/19  3:26 PM   Specimen: Nasopharyngeal  Result Value Ref Range Status   MRSA by PCR NEGATIVE NEGATIVE Final    Comment:        The GeneXpert MRSA Assay (FDA approved for NASAL specimens only), is one component of a comprehensive MRSA colonization surveillance program. It is not intended to diagnose MRSA infection nor to guide or monitor treatment for MRSA infections. Performed at [facility], [address] [place], [place] [address]     Coagulation Studies: No results for input(s): LABPROT, INR in the last 72 hours.  Urinalysis: No results for input(s): COLORURINE, LABSPEC, PHURINE, GLUCOSEU, HGBUR, BILIRUBINUR, KETONESUR, PROTEINUR, UROBILINOGEN, NITRITE, LEUKOCYTESUR in the last 72 hours.  Invalid input(s): APPERANCEUR    Imaging: No results found.   Medications:   . sodium chloride    . sodium chloride     . apixaban  2.5 mg Oral BID  . atorvastatin  80 mg Oral Daily  . carvedilol  25 mg Oral BID  . Chlorhexidine Gluconate Cloth  6 each Topical Q0600  . cinacalcet  60 mg Oral BID WC  . gabapentin  100 mg Oral BID  . midodrine  10 mg Oral TID WC  . multivitamin  1 tablet Oral QHS  . sevelamer carbonate  800 mg Oral TID WC  . sodium chloride flush  3 mL Intravenous Q12H  . torsemide  60 mg Oral Daily   sodium chloride, sodium chloride,  acetaminophen **OR** acetaminophen, alteplase, guaiFENesin, heparin, Ipratropium-Albuterol, lidocaine (PF), lidocaine-prilocaine, ondansetron **OR** ondansetron (ZOFRAN) IV, pentafluoroprop-tetrafluoroeth, simethicone  Assessment/ Plan:   [name] is a 75 y.o. black female with end stage renal disease on hemodialysis, rheumatoid arthritis, CVA, congestive heart failure, multiple myeloma, hypertension, hyperlipidemia, obstructive sleep apnea, who was admitted to [facility] on 08/07/2020 with COVID-19 pneumonia.  [facility] [address] MWF left AVF 122.5kg  1.  ESRD on HD MWF.  Transitioned TTS schedule for outpatient COVID-19 cohort shift.    2.  Anemia of chronic kidney disease. Hemoglobin 10.3 EPO as outpatient.   3.  Secondary hyperparathyroidism.   - Continue cinacalcet and sevelamer with meals  4. Hypertension: with midodrine with dialysis treatments.  Currently with torsemide and carvedilol.  LOS: 5 Cassandra Joseph 1/6/20222:21 PM

## 2020-08-13 NOTE — Progress Notes (Signed)
PT Cancellation Note  Patient Details Name: Cassandra Joseph MRN: [id number] DOB: 20-Jan-1946   Cancelled Treatment:     PT attempt. Pt off floor for HD. Acute PT will continue to follow per POC and advance as able.    Willette Pa 08/13/2020, 11:36 AM

## 2020-08-13 NOTE — Plan of Care (Signed)

## 2020-08-14 DIAGNOSIS — U071 COVID-19: Secondary | ICD-10-CM | POA: Diagnosis not present

## 2020-08-14 LAB — GLUCOSE, CAPILLARY
Glucose-Capillary: 71 mg/dL (ref 70–99)
Glucose-Capillary: 98 mg/dL (ref 70–99)

## 2020-08-14 LAB — C-REACTIVE PROTEIN: CRP: 7.1 mg/dL — ABNORMAL HIGH (ref <1.0)

## 2020-08-14 MED ORDER — POLYETHYLENE GLYCOL 3350 17 G PO PACK
17.0000 g | PACK | Freq: Every day | ORAL | Status: DC
  Administered 2020-08-14 – 2020-08-16 (×2): 17 g via ORAL
  Filled 2020-08-14 (×2): qty 1

## 2020-08-14 MED ORDER — BISACODYL 10 MG RE SUPP
10.0000 mg | Freq: Once | RECTAL | Status: DC
  Filled 2020-08-14: qty 1

## 2020-08-14 NOTE — Progress Notes (Signed)
Physical Therapy Treatment Patient Details Name: Cassandra Joseph MRN: [id number] DOB: 06/16/1946 Today's Date: 08/14/2020    History of Present Illness Cassandra Joseph is a 78yoF admitted for pneumonia secondary to covid with complaints of SOB symptoms. HIstory includes CHF, CKD, chronic O2 use 3L at home, ESRD with HD on MWF, and multiple myeloma.    PT Comments    Pt in bed upon entry agreeable to participate. Continued to work on basic mobility techniques required for ADL performance in order to improve safety, independence, and tolerance. Pt tolerates session well in general, no increased pain, adequate rest breaks offered as needed. Pt continues to require heavy assist to rise to standing, eventually maxA for 5th rep. VSS during session. Pt able to maintain balance in standing c RW for short periods. AMB remains difficulty and limited to single digit distances. Pt continues to make gradual progress toward goals. Pt left in CHAIR at end of session.   Follow Up Recommendations  SNF     Equipment Recommendations  None recommended by PT    Recommendations for Other Services       Precautions / Restrictions Precautions Precautions: Fall Restrictions Weight Bearing Restrictions: No    Mobility  Bed Mobility Overal bed mobility: Needs Assistance Bed Mobility: Supine to Sit     Supine to sit: Modified independent (Device/Increase time)        Transfers Overall transfer level: Needs assistance Equipment used: Rolling walker (2 wheeled) Transfers: Sit to/from Stand Sit to Stand: Min assist;From elevated surface         General transfer comment: mod-maxA from standard height; has a chronic DJD Rt shoulder. very limited use of RUE  Ambulation/Gait Ambulation/Gait assistance: Min guard Gait Distance (Feet): 6 Feet Assistive device: Rolling walker (2 wheeled) Gait Pattern/deviations: Step-to pattern     General Gait Details: very weak in bilat hips; maintains locked  knees most of stance phase.   Stairs             Wheelchair Mobility    Modified Rankin (Stroke Patients Only)       Balance                                            Cognition Arousal/Alertness: Awake/alert Behavior During Therapy: WFL for tasks assessed/performed Overall Cognitive Status: Within Functional Limits for tasks assessed                                        Exercises Other Exercises Other Exercises: STS x 5 from EOB -> standing for 15-20sec upright    General Comments        Pertinent Vitals/Pain Pain Assessment: No/denies pain    Home Living                      Prior Function            PT Goals (current goals can now be found in the care plan section) Acute Rehab PT Goals Patient Stated Goal: to get stronger PT Goal Formulation: With patient Time For Goal Achievement: 08/22/20 Potential to Achieve Goals: Fair Additional Goals Additional Goal #1: Pt will be able to perform bed mobility/transfers with mod I and safe technique. Progress towards PT goals: Not progressing toward goals -  comment    Frequency    Min 2X/week      PT Plan Current plan remains appropriate    Co-evaluation              AM-PAC PT "6 Clicks" Mobility   Outcome Measure  Help needed turning from your back to your side while in a flat bed without using bedrails?: None Help needed moving from lying on your back to sitting on the side of a flat bed without using bedrails?: None Help needed moving to and from a bed to a chair (including a wheelchair)?: A Lot Help needed standing up from a chair using your arms (e.g., wheelchair or bedside chair)?: A Lot Help needed to walk in hospital room?: A Lot Help needed climbing 3-5 steps with a railing? : Total 6 Click Score: 15    End of Session Equipment Utilized During Treatment: Oxygen Activity Tolerance: Patient limited by fatigue;Patient tolerated treatment  well;No increased pain Patient left: in chair;with chair alarm set;with call bell/phone within reach Nurse Communication: Mobility status PT Visit Diagnosis: Muscle weakness (generalized) (M62.81);History of falling (Z91.81);Difficulty in walking, not elsewhere classified (R26.2)     Time: 9563-8756 PT Time Calculation (min) (ACUTE ONLY): 24 min  Charges:  $Therapeutic Exercise: 23-37 mins                     4:14 PM, 08/14/20 Etta Grandchild, PT, DPT Physical Therapist - [facility]  [phone] ([place])    [place] C 08/14/2020, 4:11 PM

## 2020-08-14 NOTE — Progress Notes (Signed)
[facility]  ROUNDING NOTE   Subjective:   Hemodialysis treatment yesterday. Tolerated treatment well. UF of 2 liters.   Patient states she continues to feel tired and weak but she states she has improved since admission.   Objective:  Vital signs in last 24 hours:  Temp:  [97.8 F (36.6 C)-98.3 F (36.8 C)] 97.8 F (36.6 C) (01/07 0417) Pulse Rate:  [54-72] 59 (01/07 0829) Resp:  [16-22] 18 (01/07 0829) BP: (122-177)/(50-112) 122/50 (01/07 0829) SpO2:  [97 %-100 %] 97 % (01/07 0829)  Weight change:  Filed Weights   08/09/20 1822 08/11/20 0547 08/13/20 0500  Weight: 111.8 kg 122.2 kg 123.2 kg    Intake/Output: I/O last 3 completed shifts: In: 120 [P.O.:120] Out: 2000 [Other:2000]   Intake/Output this shift:  No intake/output data recorded.  Physical Exam: General:  No acute distress  Head:  Normocephalic, atraumatic. Moist oral mucosal membranes  Eyes:  Anicteric  Neck:  Supple  Lungs:   clear   Heart:  irregular, bradycardia  Abdomen:   Soft, nondistended  Extremities:  No peripheral edema.  Neurologic:  Awake, alert, following commands  Skin:  No lesions  Access:  Right upper extremity AVF    Basic Metabolic Panel: Recent Labs  Lab 08/08/20 0016 08/09/20 0649 08/10/20 0726 08/10/20 0859 08/11/20 0324 08/12/20 0342  NA 140 140 138  --  137 138  K 4.3 4.4 4.9  --  4.8 5.0  CL 97* 98 97*  --  99 99  CO2 _0 --  27 26  GLUCOSE 82 75 74  --  80 76  BUN 51* 44* 60*  --  37* 54*  CREATININE 9.29* 7.81* 9.36*  --  6.50* 8.49*  CALCIUM 9.6 9.1 8.4*  --  8.8* 9.4  PHOS  --   --   --  7.0*  --   --     Liver Function Tests: Recent Labs  Lab 08/08/20 0016  AST 15  ALT 10  ALKPHOS 60  BILITOT 0.9  PROT 7.4  ALBUMIN 3.0*   No results for input(s): LIPASE, AMYLASE in the last 168 hours. No results for input(s): AMMONIA in the last 168 hours.  CBC: Recent Labs  Lab 08/07/20 1737 08/08/20 0016 08/09/20 0649 08/10/20 0726  08/11/20 0324 08/12/20 0342  WBC 3.6*  3.6* 3.1* 3.0* 3.7* 5.0 4.9  NEUTROABS 1.6*  --   --   --   --   --   HGB 11.3*  10.9* 10.5* 10.6* 10.4* 10.5* 10.3*  HCT 34.6*  34.7* 33.9* 33.7* 32.6* 33.5* 32.2*  MCV 96.4  98.0 98.5 97.7 97.9 98.0 97.6  PLT 163  166 152 164 186 180 181    Cardiac Enzymes: No results for input(s): CKTOTAL, CKMB, CKMBINDEX, TROPONINI in the last 168 hours.  BNP: Invalid input(s): POCBNP  CBG: Recent Labs  Lab 08/12/20 0912 08/12/20 1232 08/13/20 0018 08/13/20 2151 08/14/20 0826  GLUCAP 100* 68* 79 82 71    Microbiology: Results for orders placed or performed during the hospital encounter of 06/23/19  SARS CORONAVIRUS 2 (TAT 6-24 HRS) Nasopharyngeal Nasopharyngeal Swab     Status: None   Collection Time: 06/23/19  7:36 PM   Specimen: Nasopharyngeal Swab  Result Value Ref Range Status   SARS Coronavirus 2 NEGATIVE NEGATIVE Final    Comment: (NOTE) SARS-CoV-2 target nucleic acids are NOT DETECTED. The SARS-CoV-2 RNA is generally detectable in upper and lower respiratory specimens during the acute phase  of infection. Negative results do not preclude SARS-CoV-2 infection, do not rule out co-infections with other pathogens, and should not be used as the sole basis for treatment or other patient management decisions. Negative results must be combined with clinical observations, patient history, and epidemiological information. The expected result is Negative. Fact Sheet for Patients: SugarRoll.be Fact Sheet for Healthcare Providers: https://www.woods-mathews.com/ This test is not yet approved or cleared by the [place] FDA and  has been authorized for detection and/or diagnosis of SARS-CoV-2 by FDA under an Emergency Use Authorization (EUA). This EUA will remain  in effect (meaning this test can be used) for the duration of the COVID-19 declaration under Section 56 4(b)(1) of the Act, 21  U.S.C. section 360bbb-3(b)(1), unless the authorization is terminated or revoked sooner. Performed at [facility], [place] [address], [place], [place] [address]   MRSA PCR Screening     Status: None   Collection Time: 06/25/19  3:26 PM   Specimen: Nasopharyngeal  Result Value Ref Range Status   MRSA by PCR NEGATIVE NEGATIVE Final    Comment:        The GeneXpert MRSA Assay (FDA approved for NASAL specimens only), is one component of a comprehensive MRSA colonization surveillance program. It is not intended to diagnose MRSA infection nor to guide or monitor treatment for MRSA infections. Performed at [facility], [address] [place], [place] [address]     Coagulation Studies: No results for input(s): LABPROT, INR in the last 72 hours.  Urinalysis: No results for input(s): COLORURINE, LABSPEC, PHURINE, GLUCOSEU, HGBUR, BILIRUBINUR, KETONESUR, PROTEINUR, UROBILINOGEN, NITRITE, LEUKOCYTESUR in the last 72 hours.  Invalid input(s): APPERANCEUR    Imaging: No results found.   Medications:    . apixaban  2.5 mg Oral BID  . atorvastatin  80 mg Oral Daily  . bisacodyl  10 mg Rectal Once  . carvedilol  25 mg Oral BID  . Chlorhexidine Gluconate Cloth  6 each Topical Q0600  . cinacalcet  60 mg Oral BID WC  . gabapentin  100 mg Oral BID  . midodrine  10 mg Oral TID WC  . multivitamin  1 tablet Oral QHS  . polyethylene glycol  17 g Oral Daily  . sevelamer carbonate  800 mg Oral TID WC  . sodium chloride flush  3 mL Intravenous Q12H  . torsemide  60 mg Oral Daily   acetaminophen **OR** acetaminophen, guaiFENesin, Ipratropium-Albuterol, ondansetron **OR** ondansetron (ZOFRAN) IV, simethicone  Assessment/ Plan:   Cassandra Joseph is a 75 y.o. black female with end stage renal disease on hemodialysis, rheumatoid arthritis, CVA, congestive heart failure, multiple myeloma, hypertension, hyperlipidemia, obstructive sleep apnea, who was admitted to [facility] on  08/07/2020 with COVID-19 pneumonia.  The [facility] [address] MWF left AVF 122.5kg  1.  ESRD on HD MWF.  Transitioned TTS schedule for outpatient COVID-19 cohort shift.  Outpatient [facility]  2.  Anemia of chronic kidney disease.  EPO as outpatient.   3.  Secondary hyperparathyroidism.   - Continue cinacalcet and sevelamer with meals  4. Hypertension: with midodrine with dialysis treatments.  Currently with torsemide and carvedilol. Monitor heart rate on carvedilol.    LOS: 6 Cassandra Joseph 1/7/202211:30 AM

## 2020-08-14 NOTE — Progress Notes (Signed)
Occupational Therapy Treatment Patient Details Name: SHAUNTELLE JAMERSON MRN: [id number] DOB: 06/26/1946 Today's Date: 08/14/2020    History of present illness Karson Chicas is a 71yoF admitted for pneumonia secondary to covid with complaints of SOB symptoms. HIstory includes CHF, CKD, chronic O2 use 3L at home, ESRD with HD on MWF, and multiple myeloma.   OT comments  Upon entering the room, pt seated in recliner chair with no c/o pain.  Pt agreeable to attempt toileting this session. Sit >stand with mod A from recliner chair. Min A for stand pivot transfer with use of RW to [facility]. Pt unable to have controlled descent to commode. Pt have large, hard BM this session and needing mod A to stand once again from [facility]. OT assisted with clothing management and hygiene with max A and then pt returned to recliner chair in same manner as above. Pt agreeable to remain in recliner chair with call bell within reach and chair alarm activated. Pt continues to benefit from OT intervention.    Follow Up Recommendations  SNF    Equipment Recommendations  Other (comment)       Precautions / Restrictions Precautions Precautions: Fall Restrictions Weight Bearing Restrictions: No       Mobility Bed Mobility Overal bed mobility: Needs Assistance Bed Mobility: Supine to Sit     Supine to sit: Modified independent (Device/Increase time)     General bed mobility comments: Pt seated in recliner chair  Transfers Overall transfer level: Needs assistance Equipment used: Rolling walker (2 wheeled) Transfers: Sit to/from Stand Sit to Stand: Mod assist         General transfer comment: mod A from recliner chair    Balance Overall balance assessment: Needs assistance;History of Falls Sitting-balance support: Single extremity supported;Feet unsupported Sitting balance-Leahy Scale: Good     Standing balance support: Bilateral upper extremity supported Standing balance-Leahy Scale: Poor                              ADL either performed or assessed with clinical judgement   ADL Overall ADL's : Needs assistance/impaired                         Toilet Transfer: Moderate assistance;RW;BSC   Toileting- Clothing Manipulation and Hygiene: Maximal assistance;Sit to/from stand         General ADL Comments: Pt standing with mod lifting assistance and needing assistance for clothing management and hygiene.     Vision Patient Visual Report: No change from baseline            Cognition Arousal/Alertness: Awake/alert Behavior During Therapy: WFL for tasks assessed/performed Overall Cognitive Status: Within Functional Limits for tasks assessed                                                     Pertinent Vitals/ Pain       Pain Assessment: Faces Faces Pain Scale: Hurts even more Pain Location: buttocks Pain Descriptors / Indicators: Aching;Discomfort Pain Intervention(s): Limited activity within patient's tolerance;Repositioned  Home Living  Prior Functioning/Environment              Frequency  Min 1X/week        Progress Toward Goals  OT Goals(current goals can now be found in the care plan section)  Progress towards OT goals: Progressing toward goals  Acute Rehab OT Goals Patient Stated Goal: to get stronger OT Goal Formulation: With patient Time For Goal Achievement: 08/23/20 Potential to Achieve Goals: Good  Plan Discharge plan remains appropriate       AM-PAC OT "6 Clicks" Daily Activity     Outcome Measure   Help from another person eating meals?: None Help from another person taking care of personal grooming?: A Little Help from another person toileting, which includes using toliet, bedpan, or urinal?: A Lot Help from another person bathing (including washing, rinsing, drying)?: A Lot Help from another person to put on and taking off regular upper body  clothing?: A Little Help from another person to put on and taking off regular lower body clothing?: A Lot 6 Click Score: 16    End of Session Equipment Utilized During Treatment: Oxygen  OT Visit Diagnosis: Other abnormalities of gait and mobility (R26.89);Muscle weakness (generalized) (M62.81)   Activity Tolerance Patient tolerated treatment well   Patient Left in bed;with call bell/phone within reach   Nurse Communication Mobility status        Time: 1510-1537 OT Time Calculation (min): 27 min  Charges: OT General Charges $OT Visit: 1 Visit OT Evaluation $OT Eval Low Complexity: 1 Low OT Treatments $Self Care/Home Management : 23-37 mins  Darleen Crocker, MS, OTR/L , CBIS ascom [phone]  08/14/20, 4:47 PM

## 2020-08-14 NOTE — Progress Notes (Signed)
PROGRESS NOTE  Cassandra Joseph HYQ:[id number] DOB: [birth year] DOA: 08/07/2020 PCP: Tracie Harrier, MD   LOS: 6 days   Brief Narrative / Interim history: 75 year old female with ESRD on HD MWF, HTN, HLD, RA, prior CVA, multiple myeloma, sleep apnea on CPAP, persistent A. fib on Eliquis, chronic diastolic CHF, chronic hypoxia with 3 L baseline, came into the hospital with fatigue, weakness, pleuritic chest pain.  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   Anemia of chronic kidney disease -likely secondary ESRD. Will transfuse if Hb <7.0.  Hemoglobin stable  Generalized weakness -PT/OT recs SNF. Pt is agreeable to go to SNF, placement likely on Monday  Chronic diastolic CHF -continue on home dose of carvedilol, torsemide.   Fluid management per HD  HLD -continue on statin   RA -not on any meds for RA   Likely PAF -continue on eliquis, carvedilol   Hx ofTIA/CVA -continue on statin   Multiple myeloma -management per onc as an outpatient   Morbid obesity: BMI is 95.2 -Complicates overall care and prognosis   Scheduled Meds: . apixaban  2.5 mg Oral BID  . atorvastatin  80 mg Oral Daily  . bisacodyl  10 mg Rectal Once  . carvedilol  25  mg Oral BID  . Chlorhexidine Gluconate Cloth  6 each Topical Q0600  . cinacalcet  60 mg Oral BID WC  . gabapentin  100 mg Oral BID  . midodrine  10 mg Oral TID WC  . multivitamin  1 tablet Oral QHS  . sevelamer carbonate  800 mg Oral TID WC  . sodium chloride flush  3 mL Intravenous Q12H  . torsemide  60 mg Oral Daily   Continuous Infusions:  PRN Meds:.acetaminophen **OR** acetaminophen, guaiFENesin, Ipratropium-Albuterol, ondansetron **OR** ondansetron (ZOFRAN) IV, simethicone  DVT prophylaxis: Eliquis Code Status: Full code Family Communication: d/w patient    Status is: Inpatient  Remains inpatient appropriate because:Unsafe d/c plan   Dispo: The patient is from: Home              Anticipated d/c is to: SNF              Anticipated d/c date is: 1 day              Patient currently is medically stable to d/c.  Consultants:  Nephrology   Procedures:  None   Microbiology: None   Antibacterials: None    Objective: Vitals:   08/14/20 0146 08/14/20 0149 08/14/20 0417 08/14/20 0829  BP: (!) 153/112 138/65 (!) 129/53 (!) 122/50  Pulse: 60 70 (!) 59 (!) 59  Resp: 18  18 18   Temp: 98 F (36.7 C)  97.8 F (36.6 C)   TempSrc:      SpO2:  100% 98% 99% 97%  Weight:      Height:        Intake/Output Summary (Last 24 hours) at 08/14/2020 1124 Last data filed at 08/13/2020 1320 Gross per 24 hour  Intake --  Output 2000 ml  Net -2000 ml   Filed Weights   08/09/20 1822 08/11/20 0547 08/13/20 0500  Weight: 111.8 kg 122.2 kg 123.2 kg    Examination:  Constitutional: NAD Eyes: no icterus  ENMT: mmm  Data Reviewed: I have independently reviewed following labs and imaging studies   CBC: Recent Labs  Lab 08/07/20 1737 08/08/20 0016 08/09/20 0649 08/10/20 0726 08/11/20 0324 08/12/20 0342  WBC 3.6*  3.6* 3.1* 3.0* 3.7* 5.0 4.9  NEUTROABS 1.6*  --   --   --   --   --   HGB 11.3*  10.9* 10.5* 10.6* 10.4* 10.5* 10.3*  HCT 34.6*  34.7* 33.9* 33.7* 32.6* 33.5*  32.2*  MCV 96.4  98.0 98.5 97.7 97.9 98.0 97.6  PLT 163  166 152 164 186 180 299   Basic Metabolic Panel: Recent Labs  Lab 08/08/20 0016 08/09/20 0649 08/10/20 0726 08/10/20 0859 08/11/20 0324 08/12/20 0342  NA 140 140 138  --  137 138  K 4.3 4.4 4.9  --  4.8 5.0  CL 97* 98 97*  --  99 99  CO2 27 28 27   --  27 26  GLUCOSE 82 75 74  --  80 76  BUN 51* 44* 60*  --  37* 54*  CREATININE 9.29* 7.81* 9.36*  --  6.50* 8.49*  CALCIUM 9.6 9.1 8.4*  --  8.8* 9.4  PHOS  --   --   --  7.0*  --   --    GFR: Estimated Creatinine Clearance: 7.5 mL/min (A) (by C-G formula based on SCr of 8.49 mg/dL (H)). Liver Function Tests: Recent Labs  Lab 08/08/20 0016  AST 15  ALT 10  ALKPHOS 60  BILITOT 0.9  PROT 7.4  ALBUMIN 3.0*   No results for input(s): LIPASE, AMYLASE in the last 168 hours. No results for input(s): AMMONIA in the last 168 hours. Coagulation Profile: No results for input(s): INR, PROTIME in the last 168 hours. Cardiac Enzymes: No results for input(s): CKTOTAL, CKMB, CKMBINDEX, TROPONINI in the last 168 hours. BNP (last 3 results) No results for input(s): PROBNP in the last 8760 hours. HbA1C: No results for input(s): HGBA1C in the last 72 hours. CBG: Recent Labs  Lab 08/12/20 0912 08/12/20 1232 08/13/20 0018 08/13/20 2151 08/14/20 0826  GLUCAP 100* 68* 79 82 71   Lipid Profile: No results for input(s): CHOL, HDL, LDLCALC, TRIG, CHOLHDL, LDLDIRECT in the last 72 hours. Thyroid Function Tests: No results for input(s): TSH, T4TOTAL, FREET4, T3FREE, THYROIDAB in the last 72 hours. Anemia Panel: No results for input(s): VITAMINB12, FOLATE, FERRITIN, TIBC, IRON, RETICCTPCT in the last 72 hours. Urine analysis: No results found for: COLORURINE, APPEARANCEUR, LABSPEC, PHURINE, GLUCOSEU, HGBUR, BILIRUBINUR, KETONESUR, PROTEINUR, UROBILINOGEN, NITRITE, LEUKOCYTESUR Sepsis Labs: Invalid input(s): PROCALCITONIN, LACTICIDVEN  No results found for this or any previous  visit (from the past 240 hour(s)).    Radiology Studies: No results found.   Marzetta Board, MD, PhD [facility]  Between 7 am - 7 pm I am available, please contact me via Amion or Securechat  Between 7 pm - 7 am I am not available, please contact night coverage MD/APP via Amion

## 2020-08-15 DIAGNOSIS — R531 Weakness: Secondary | ICD-10-CM | POA: Diagnosis not present

## 2020-08-15 DIAGNOSIS — U071 COVID-19: Secondary | ICD-10-CM | POA: Diagnosis not present

## 2020-08-15 DIAGNOSIS — N186 End stage renal disease: Secondary | ICD-10-CM | POA: Diagnosis not present

## 2020-08-15 DIAGNOSIS — J9611 Chronic respiratory failure with hypoxia: Secondary | ICD-10-CM | POA: Diagnosis not present

## 2020-08-15 LAB — GLUCOSE, CAPILLARY
Glucose-Capillary: 78 mg/dL (ref 70–99)
Glucose-Capillary: 76 mg/dL (ref 70–99)

## 2020-08-15 LAB — RENAL FUNCTION PANEL
Albumin: 3.1 g/dL — ABNORMAL LOW (ref 3.5–5.0)
Anion gap: 14 (ref 5–15)
BUN: 69 mg/dL — ABNORMAL HIGH (ref 8–23)
CO2: 27 mmol/L (ref 22–32)
Calcium: 9.2 mg/dL (ref 8.9–10.3)
Chloride: 96 mmol/L — ABNORMAL LOW (ref 98–111)
Creatinine, Ser: 9.86 mg/dL — ABNORMAL HIGH (ref 0.44–1.00)
GFR, Estimated: 4 mL/min — ABNORMAL LOW (ref >60)
Glucose, Bld: 106 mg/dL — ABNORMAL HIGH (ref 70–99)
Phosphorus: 6.1 mg/dL — ABNORMAL HIGH (ref 2.5–4.6)
Potassium: 5.4 mmol/L — ABNORMAL HIGH (ref 3.5–5.1)
Sodium: 137 mmol/L (ref 135–145)

## 2020-08-15 LAB — CBC
HCT: 34.7 % — ABNORMAL LOW (ref 36.0–46.0)
Hemoglobin: 10.7 g/dL — ABNORMAL LOW (ref 12.0–15.0)
MCH: 30.5 pg (ref 26.0–34.0)
MCHC: 30.8 g/dL (ref 30.0–36.0)
MCV: 98.9 fL (ref 80.0–100.0)
Platelets: 207 10*3/uL (ref 150–400)
RBC: 3.51 MIL/uL — ABNORMAL LOW (ref 3.87–5.11)
RDW: 14.9 % (ref 11.5–15.5)
WBC: 4.8 10*3/uL (ref 4.0–10.5)
nRBC: 0 % (ref 0.0–0.2)

## 2020-08-15 MED ORDER — EPOETIN ALFA 10000 UNIT/ML IJ SOLN: 4000.0000 [IU] | INTRAMUSCULAR | Status: DC

## 2020-08-15 NOTE — Progress Notes (Signed)
Pt returned from dialysis in stable condition.

## 2020-08-15 NOTE — Progress Notes (Signed)
Pt to dialysis in stable condition.

## 2020-08-15 NOTE — Progress Notes (Signed)
PROGRESS NOTE  Cassandra Joseph:[id number] DOB: 1946-05-05 DOA: 08/07/2020 PCP: Tracie Harrier, MD   LOS: 7 days   Brief Narrative / Interim history: 75 year old female with ESRD on HD MWF, HTN, HLD, RA, prior CVA, multiple myeloma, sleep apnea on CPAP, persistent A. fib on Eliquis, chronic diastolic CHF, chronic hypoxia with 3 L baseline, came into the hospital with fatigue, weakness, pleuritic chest pain.  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 Per nephrology, her schedule has been changed so she is getting dialysis today on Saturday -Slightly hyperkalemic today at 5.4 but will undergo dialysis  Anemia of chronic kidney disease -likely secondary ESRD. Will transfuse if Hb <7.0.  Hemoglobin stable  Generalized weakness -PT/OT recs SNF. Pt is agreeable to go to SNF, placement likely on Monday  Chronic diastolic CHF -continue on home dose of carvedilol, torsemide.  Appears euvolemic  HLD -continue on statin   RA -not on any meds for RA   Likely PAF -continue on eliquis, carvedilol   Hx ofTIA/CVA -continue on statin   Multiple myeloma -management per onc as an outpatient   Morbid obesity: BMI is 65.6 -Complicates overall care and  prognosis   Scheduled Meds: . apixaban  2.5 mg Oral BID  . atorvastatin  80 mg Oral Daily  . bisacodyl  10 mg Rectal Once  . carvedilol  25 mg Oral BID  . Chlorhexidine Gluconate Cloth  6 each Topical Q0600  . cinacalcet  60 mg Oral BID WC  . gabapentin  100 mg Oral BID  . midodrine  10 mg Oral TID WC  . multivitamin  1 tablet Oral QHS  . polyethylene glycol  17 g Oral Daily  . sevelamer carbonate  800 mg Oral TID WC  . sodium chloride flush  3 mL Intravenous Q12H  . torsemide  60 mg Oral Daily   Continuous Infusions:  PRN Meds:.acetaminophen **OR** acetaminophen, guaiFENesin, Ipratropium-Albuterol, ondansetron **OR** ondansetron (ZOFRAN) IV, simethicone  DVT prophylaxis: Eliquis Code Status: Full code Family Communication: d/w patient    Status is: Inpatient  Remains inpatient appropriate because:Unsafe d/c plan   Dispo: The patient is from: Home              Anticipated d/c is to: SNF              Anticipated d/c date is: 1 day              Patient currently is medically stable to d/c.  Consultants:  Nephrology   Procedures:  None   Microbiology: None   Antibacterials: None    Objective: Vitals:   08/15/20 0051 08/15/20 0500 08/15/20 0529 08/15/20 0850  BP: (!) 122/59  (!) 116/36 (!) 155/78  Pulse: 63  60 (!) 56  Resp: _0 Temp: 97.9 F (36.6 C)  97.8 F (36.6 C) 98 F (36.7 C)  TempSrc: Oral  Oral Oral  SpO2: 100%  100% 98%  Weight:  (!) 259 kg    Height:        Intake/Output Summary (Last 24 hours) at 08/15/2020 1041 Last data filed at 08/14/2020 1800 Gross per 24 hour  Intake --  Output 1 ml  Net -1 ml   Filed Weights   08/11/20 0547 08/13/20 0500 08/15/20 0500  Weight: 122.2 kg 123.2 kg (!) 259 kg    Examination:  Constitutional: NAD, calm, comfortable Eyes: No scleral icterus ENMT: Mucous membranes are moist.  Neck: normal, supple Respiratory: clear to auscultation bilaterally, no wheezing, no crackles. Normal respiratory  effort.  Cardiovascular: Regular rate and rhythm, no murmurs / rubs / gallops. No LE edema.   Abdomen: no tenderness. Bowel sounds positive.  Skin: no rashes, lesions, ulcers. No induration Neurologic: non focal  Data Reviewed: I have independently reviewed following labs and imaging studies   CBC: Recent Labs  Lab 08/09/20 0649 08/10/20 0726 08/11/20 0324 08/12/20 0342 08/15/20 1002  WBC 3.0* 3.7* 5.0 4.9 4.8  HGB 10.6* 10.4* 10.5* 10.3* 10.7*  HCT 33.7* 32.6* 33.5* 32.2* 34.7*  MCV 97.7 97.9 98.0 97.6 98.9  PLT 164 186 180 181 097   Basic Metabolic Panel: Recent Labs  Lab 08/09/20 0649 08/10/20 0726 08/10/20 0859 08/11/20 0324 08/12/20 0342 08/15/20 1002  NA 140 138  --  137 138 137  K 4.4 4.9  --  4.8 5.0 5.4*  CL 98 97*  --  99 99 96*  CO2 28 27  --  _1 GLUCOSE 75 74  --  80 76 106*  BUN 44* 60*  --  37* 54* 69*  CREATININE 7.81* 9.36*  --  6.50* 8.49* 9.86*  CALCIUM 9.1 8.4*  --  8.8* 9.4 9.2  PHOS  --   --  7.0*  --   --  6.1*   GFR: Estimated Creatinine Clearance: 10.8 mL/min (A) (by C-G formula based on SCr of 9.86 mg/dL (H)). Liver Function Tests: Recent Labs  Lab 08/15/20 1002  ALBUMIN 3.1*   No results for input(s): LIPASE, AMYLASE in the last 168 hours. No results for input(s): AMMONIA in the last 168 hours. Coagulation Profile: No results for input(s): INR, PROTIME in the last 168 hours. Cardiac Enzymes: No results for input(s): CKTOTAL, CKMB, CKMBINDEX, TROPONINI in the last 168 hours. BNP (last 3 results) No results for input(s): PROBNP in the last 8760 hours. HbA1C: No results for input(s): HGBA1C in the last 72 hours. CBG: Recent Labs  Lab 08/13/20 0018 08/13/20 2151 08/14/20 0826 08/14/20 2027 08/15/20 0840  GLUCAP 79 82 71 98 78   Lipid Profile: No results for input(s): CHOL, HDL, LDLCALC, TRIG, CHOLHDL, LDLDIRECT in the last 72 hours. Thyroid Function Tests: No results for input(s): TSH, T4TOTAL, FREET4, T3FREE,  THYROIDAB in the last 72 hours. Anemia Panel: No results for input(s): VITAMINB12, FOLATE, FERRITIN, TIBC, IRON, RETICCTPCT in the last 72 hours. Urine analysis: No results found for: COLORURINE, APPEARANCEUR, LABSPEC, PHURINE, GLUCOSEU, HGBUR, BILIRUBINUR, KETONESUR, PROTEINUR, UROBILINOGEN, NITRITE, LEUKOCYTESUR Sepsis Labs: Invalid input(s): PROCALCITONIN, LACTICIDVEN  No results found for this or any previous visit (from the past 240 hour(s)).    Radiology Studies: No results found.   Marzetta Board, MD, PhD [facility]  Between 7 am - 7 pm I am available, please contact me  via Amion or Securechat  Between 7 pm - 7 am I am not available, please contact night coverage MD/APP via Amion

## 2020-08-15 NOTE — Progress Notes (Signed)
[facility]  ROUNDING NOTE   Subjective:    Patient seen during dialysis Tolerating well    HEMODIALYSIS FLOWSHEET:  Blood Flow Rate (mL/min): 200 mL/min Arterial Pressure (mmHg): -190 mmHg Venous Pressure (mmHg): 150 mmHg Transmembrane Pressure (mmHg): 50 mmHg Ultrafiltration Rate (mL/min): 1000 mL/min Dialysate Flow Rate (mL/min): 600 ml/min Conductivity: Machine : 14.2 Conductivity: Machine : 14.2 Dialysis Fluid Bolus: Normal [place] Bolus Amount (mL): 200 mL  Patient reports that last night she was not able to eat but ate some breakfast this morning Tolerated so far Denies any acute shortness of breath  Objective:  Vital signs in last 24 hours:  Temp:  [97.5 F (36.4 C)-98.3 F (36.8 C)] 98 F (36.7 C) (01/08 0850) Pulse Rate:  [56-63] 56 (01/08 0850) Resp:  [14-18] 14 (01/08 0850) BP: (116-155)/(36-78) 155/78 (01/08 0850) SpO2:  [97 %-100 %] 98 % (01/08 0850) Weight:  [259 kg] 259 kg (01/08 0500)  Weight change:  Filed Weights   08/11/20 0547 08/13/20 0500 08/15/20 0500  Weight: 122.2 kg 123.2 kg (!) 259 kg    Intake/Output: I/O last 3 completed shifts: In: -  Out: 1 [Stool:1]   Intake/Output this shift:  No intake/output data recorded.   Physical Exam: General:  No acute distress, laying in the bed  HEENT  anicteric, moist oral mucous membrane  Pulm/lungs  normal breathing effort, [place] O2  CVS/Heart  regular rhythm, no rub or gallop  Abdomen:   Soft, nontender  Extremities:  trace peripheral edema  Neurologic:  Alert, oriented, able to follow commands  Skin:  No acute rashes  Rt upper arm AVF    Basic Metabolic Panel: Recent Labs  Lab 08/09/20 0649 08/10/20 0726 08/10/20 0859 08/11/20 0324 08/12/20 0342 08/15/20 1002  NA 140 138  --  137 138 137  K 4.4 4.9  --  4.8 5.0 5.4*  CL 98 97*  --  99 99 96*  CO2 28 27  --  27 26 27   GLUCOSE 75 74  --  80 76 106*  BUN 44* 60*  --  37* 54* 69*  CREATININE 7.81* 9.36*  --  6.50*  8.49* 9.86*  CALCIUM 9.1 8.4*  --  8.8* 9.4 9.2  PHOS  --   --  7.0*  --   --  6.1*    Liver Function Tests: Recent Labs  Lab 08/15/20 1002  ALBUMIN 3.1*   No results for input(s): LIPASE, AMYLASE in the last 168 hours. No results for input(s): AMMONIA in the last 168 hours.  CBC: Recent Labs  Lab 08/09/20 0649 08/10/20 0726 08/11/20 0324 08/12/20 0342 08/15/20 1002  WBC 3.0* 3.7* 5.0 4.9 4.8  HGB 10.6* 10.4* 10.5* 10.3* 10.7*  HCT 33.7* 32.6* 33.5* 32.2* 34.7*  MCV 97.7 97.9 98.0 97.6 98.9  PLT 164 186 180 181 207    Cardiac Enzymes: No results for input(s): CKTOTAL, CKMB, CKMBINDEX, TROPONINI in the last 168 hours.  BNP: Invalid input(s): POCBNP  CBG: Recent Labs  Lab 08/13/20 0018 08/13/20 2151 08/14/20 0826 08/14/20 2027 08/15/20 0840  GLUCAP 79 82 71 98 72    Microbiology: Results for orders placed or performed during the hospital encounter of 06/23/19  SARS CORONAVIRUS 2 (TAT 6-24 HRS) Nasopharyngeal Nasopharyngeal Swab     Status: None   Collection Time: 06/23/19  7:36 PM   Specimen: Nasopharyngeal Swab  Result Value Ref Range Status   SARS Coronavirus 2 NEGATIVE NEGATIVE Final    Comment: (NOTE) SARS-CoV-2 target nucleic  acids are NOT DETECTED. The SARS-CoV-2 RNA is generally detectable in upper and lower respiratory specimens during the acute phase of infection. Negative results do not preclude SARS-CoV-2 infection, do not rule out co-infections with other pathogens, and should not be used as the sole basis for treatment or other patient management decisions. Negative results must be combined with clinical observations, patient history, and epidemiological information. The expected result is Negative. Fact Sheet for Patients: SugarRoll.be Fact Sheet for Healthcare Providers: https://www.woods-mathews.com/ This test is not yet approved or cleared by the [place] FDA and  has been authorized for  detection and/or diagnosis of SARS-CoV-2 by FDA under an Emergency Use Authorization (EUA). This EUA will remain  in effect (meaning this test can be used) for the duration of the COVID-19 declaration under Section 56 4(b)(1) of the Act, 21 U.S.C. section 360bbb-3(b)(1), unless the authorization is terminated or revoked sooner. Performed at [facility], [place] [address] [place], [place] [address]   MRSA PCR Screening     Status: None   Collection Time: 06/25/19  3:26 PM   Specimen: Nasopharyngeal  Result Value Ref Range Status   MRSA by PCR NEGATIVE NEGATIVE Final    Comment:        The GeneXpert MRSA Assay (FDA approved for NASAL specimens only), is one component of a comprehensive MRSA colonization surveillance program. It is not intended to diagnose MRSA infection nor to guide or monitor treatment for MRSA infections. Performed at [facility], [address] [place], [place] [address]     Coagulation Studies: No results for input(s): LABPROT, INR in the last 72 hours.  Urinalysis: No results for input(s): COLORURINE, LABSPEC, PHURINE, GLUCOSEU, HGBUR, BILIRUBINUR, KETONESUR, PROTEINUR, UROBILINOGEN, NITRITE, LEUKOCYTESUR in the last 72 hours.  Invalid input(s): APPERANCEUR    Imaging: No results found.   Medications:    . apixaban  2.5 mg Oral BID  . atorvastatin  80 mg Oral Daily  . bisacodyl  10 mg Rectal Once  . carvedilol  25 mg Oral BID  . Chlorhexidine Gluconate Cloth  6 each Topical Q0600  . cinacalcet  60 mg Oral BID WC  . gabapentin  100 mg Oral BID  . midodrine  10 mg Oral TID WC  . multivitamin  1 tablet Oral QHS  . polyethylene glycol  17 g Oral Daily  . sevelamer carbonate  800 mg Oral TID WC  . sodium chloride flush  3 mL Intravenous Q12H  . torsemide  60 mg Oral Daily   acetaminophen **OR** acetaminophen, guaiFENesin, Ipratropium-Albuterol, ondansetron **OR** ondansetron (ZOFRAN) IV, simethicone  Assessment/ Plan:   Ms.  DELCENIA INMAN is a 75 y.o. black female with end stage renal disease on hemodialysis, rheumatoid arthritis, CVA, congestive heart failure, multiple myeloma, hypertension, hyperlipidemia, obstructive sleep apnea, who was admitted to [facility] on 08/07/2020 with COVID-19 pneumonia.  [facility] Nephrology [facility] MWF left AVF 122.5kg  1.  ESRD on HD MWF.  Transitioned TTS schedule for outpatient COVID-19 cohort shift.  Outpatient [facility] Wants to go to rehab as outpatient as she feels weak  2.  Anemia of chronic kidney disease.  Lab Results  Component Value Date   HGB 10.7 (L) 08/15/2020   EPO as outpatient.   3.  Secondary hyperparathyroidism.   - Continue cinacalcet and sevelamer with meals   4.  COVID-19 infection Oxygen requirements: 3 L by nasal cannula    LOS: 7 Verl Kitson 1/8/202212:26 PM

## 2020-08-16 DIAGNOSIS — U071 COVID-19: Secondary | ICD-10-CM | POA: Diagnosis not present

## 2020-08-16 LAB — GLUCOSE, CAPILLARY: Glucose-Capillary: 71 mg/dL (ref 70–99)

## 2020-08-16 NOTE — Progress Notes (Signed)
PROGRESS NOTE  Cassandra Joseph FGH:[id number] DOB: August 14, 1945 DOA: 08/07/2020 PCP: Tracie Harrier, MD   LOS: 8 days   Brief Narrative / Interim history: 75 year old female with ESRD on HD MWF, HTN, HLD, RA, prior CVA, multiple myeloma, sleep apnea on CPAP, persistent A. fib on Eliquis, chronic diastolic CHF, chronic hypoxia with 3 L baseline, came into the hospital with fatigue, weakness, pleuritic chest pain.  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   Anemia of chronic kidney disease -likely secondary ESRD. Will transfuse if Hb <7.0.  Hemoglobin stable  Generalized weakness -PT/OT recs SNF. Pt is agreeable to go to SNF, placement likely on Monday  Chronic diastolic CHF -continue on home dose of carvedilol, torsemide.  Appears euvolemic  HLD -continue on statin   RA -not on any meds for RA   Likely PAF -continue on eliquis, carvedilol   Hx ofTIA/CVA -continue on statin   Multiple myeloma -management per onc as an outpatient   Morbid obesity: BMI is 67.8 -Complicates overall care and prognosis   Scheduled Meds: . apixaban  2.5 mg Oral BID  . atorvastatin  80 mg Oral Daily  . bisacodyl  10 mg Rectal Once  . carvedilol  25 mg Oral  BID  . Chlorhexidine Gluconate Cloth  6 each Topical Q0600  . cinacalcet  60 mg Oral BID WC  . [START ON 08/18/2020] epoetin (EPOGEN/PROCRIT) injection  4,000 Units Intravenous Q T,Th,Sa-HD  . gabapentin  100 mg Oral BID  . midodrine  10 mg Oral TID WC  . multivitamin  1 tablet Oral QHS  . polyethylene glycol  17 g Oral Daily  . sevelamer carbonate  800 mg Oral TID WC  . sodium chloride flush  3 mL Intravenous Q12H  . torsemide  60 mg Oral Daily   Continuous Infusions:  PRN Meds:.acetaminophen **OR** acetaminophen, guaiFENesin, Ipratropium-Albuterol, ondansetron **OR** ondansetron (ZOFRAN) IV, simethicone  DVT prophylaxis: Eliquis Code Status: Full code Family Communication: d/w patient    Status is: Inpatient  Remains inpatient appropriate because:Unsafe d/c plan   Dispo: The patient is from: Home              Anticipated d/c is to: SNF              Anticipated d/c date is: 1 day              Patient currently is medically stable to d/c.  Consultants:  Nephrology   Procedures:  None   Microbiology: None   Antibacterials: None    Objective: Vitals:   08/16/20 0455 08/16/20 0500 08/16/20 0557 08/16/20 0830  BP: (!) 120/49  (!) 143/63 134/65  Pulse: (!) 53  (!) 47 (!) 51  Resp: 18   16  Temp: 98.6  F (37 C)   98.6 F (37 C)  TempSrc:      SpO2: 100%   100%  Weight:  123.6 kg    Height:        Intake/Output Summary (Last 24 hours) at 08/16/2020 1028 Last data filed at 08/15/2020 1400 Gross per 24 hour  Intake --  Output 1000 ml  Net -1000 ml   Filed Weights   08/13/20 0500 08/15/20 0500 08/16/20 0500  Weight: 123.2 kg (!) 259 kg 123.6 kg    Examination:  Constitutional: No distress Respiratory: Clear bilaterally, no wheezing Cardiovascular: Regular rate and rhythm, no murmurs  Data Reviewed: I have independently reviewed following labs and imaging studies   CBC: Recent Labs  Lab 08/10/20 0726 08/11/20 0324 08/12/20 0342 08/15/20 1002  WBC  3.7* 5.0 4.9 4.8  HGB 10.4* 10.5* 10.3* 10.7*  HCT 32.6* 33.5* 32.2* 34.7*  MCV 97.9 98.0 97.6 98.9  PLT 186 180 181 836   Basic Metabolic Panel: Recent Labs  Lab 08/10/20 0726 08/10/20 0859 08/11/20 0324 08/12/20 0342 08/15/20 1002  NA 138  --  137 138 137  K 4.9  --  4.8 5.0 5.4*  CL 97*  --  99 99 96*  CO2 27  --  27 26 27   GLUCOSE 74  --  80 76 106*  BUN 60*  --  37* 54* 69*  CREATININE 9.36*  --  6.50* 8.49* 9.86*  CALCIUM 8.4*  --  8.8* 9.4 9.2  PHOS  --  7.0*  --   --  6.1*   GFR: Estimated Creatinine Clearance: 6.5 mL/min (A) (by C-G formula based on SCr of 9.86 mg/dL (H)). Liver Function Tests: Recent Labs  Lab 08/15/20 1002  ALBUMIN 3.1*   No results for input(s): LIPASE, AMYLASE in the last 168 hours. No results for input(s): AMMONIA in the last 168 hours. Coagulation Profile: No results for input(s): INR, PROTIME in the last 168 hours. Cardiac Enzymes: No results for input(s): CKTOTAL, CKMB, CKMBINDEX, TROPONINI in the last 168 hours. BNP (last 3 results) No results for input(s): PROBNP in the last 8760 hours. HbA1C: No results for input(s): HGBA1C in the last 72 hours. CBG: Recent Labs  Lab 08/14/20 0826 08/14/20 2027 08/15/20 0840 08/15/20 1538 08/16/20 0802  GLUCAP 71 98 78 76 71   Lipid Profile: No results for input(s): CHOL, HDL, LDLCALC, TRIG, CHOLHDL, LDLDIRECT in the last 72 hours. Thyroid Function Tests: No results for input(s): TSH, T4TOTAL, FREET4, T3FREE, THYROIDAB in the last 72 hours. Anemia Panel: No results for input(s): VITAMINB12, FOLATE, FERRITIN, TIBC, IRON, RETICCTPCT in the last 72 hours. Urine analysis: No results found for: COLORURINE, APPEARANCEUR, LABSPEC, PHURINE, GLUCOSEU, HGBUR, BILIRUBINUR, KETONESUR, PROTEINUR, UROBILINOGEN, NITRITE, LEUKOCYTESUR Sepsis Labs: Invalid input(s): PROCALCITONIN, LACTICIDVEN  No results found for this or any previous visit (from the past 240 hour(s)).    Radiology Studies: No  results found.   Marzetta Board, MD, PhD [facility]  Between 7 am - 7 pm I am available, please contact me via Amion or Securechat  Between 7 pm - 7 am I am not available, please contact night coverage MD/APP via Amion

## 2020-08-16 NOTE — Progress Notes (Signed)
[facility]  ROUNDING NOTE   Subjective:   Tolerated dialysis well yesterday Denies any acute complaints Denies any nausea or vomiting or shortness of breath No leg edema Requiring oxygen supplementation at 2 L  Objective:  Vital signs in last 24 hours:  Temp:  [97.8 F (36.6 C)-98.8 F (37.1 C)] 97.8 F (36.6 C) (01/09 1100) Pulse Rate:  [47-61] 55 (01/09 1100) Resp:  [15-20] 18 (01/09 1100) BP: (120-166)/(49-73) 137/53 (01/09 1100) SpO2:  [97 %-100 %] 98 % (01/09 1100) Weight:  [123.6 kg] 123.6 kg (01/09 0500)  Weight change: -135.4 kg Filed Weights   08/13/20 0500 08/15/20 0500 08/16/20 0500  Weight: 123.2 kg (!) 259 kg 123.6 kg    Intake/Output: I/O last 3 completed shifts: In: -  Out: 1000 [Other:1000]   Intake/Output this shift:  No intake/output data recorded.   Physical Exam: General:  No acute distress, laying in the bed  HEENT  anicteric, moist oral mucous membrane  Pulm/lungs  normal breathing effort, [place] O2  CVS/Heart  regular rhythm, no rub or gallop  Abdomen:   Soft, nontender  Extremities:  trace peripheral edema  Neurologic:  Alert, oriented, able to follow commands  Skin:  No acute rashes  Rt arm AVF    Basic Metabolic Panel: Recent Labs  Lab 08/10/20 0726 08/10/20 0859 08/11/20 0324 08/12/20 0342 08/15/20 1002  NA 138  --  137 138 137  K 4.9  --  4.8 5.0 5.4*  CL 97*  --  99 99 96*  CO2 27  --  27 26 27   GLUCOSE 74  --  80 76 106*  BUN 60*  --  37* 54* 69*  CREATININE 9.36*  --  6.50* 8.49* 9.86*  CALCIUM 8.4*  --  8.8* 9.4 9.2  PHOS  --  7.0*  --   --  6.1*    Liver Function Tests: Recent Labs  Lab 08/15/20 1002  ALBUMIN 3.1*   No results for input(s): LIPASE, AMYLASE in the last 168 hours. No results for input(s): AMMONIA in the last 168 hours.  CBC: Recent Labs  Lab 08/10/20 0726 08/11/20 0324 08/12/20 0342 08/15/20 1002  WBC 3.7* 5.0 4.9 4.8  HGB 10.4* 10.5* 10.3* 10.7*  HCT 32.6* 33.5* 32.2*  34.7*  MCV 97.9 98.0 97.6 98.9  PLT 186 180 181 207    Cardiac Enzymes: No results for input(s): CKTOTAL, CKMB, CKMBINDEX, TROPONINI in the last 168 hours.  BNP: Invalid input(s): POCBNP  CBG: Recent Labs  Lab 08/14/20 0826 08/14/20 2027 08/15/20 0840 08/15/20 1538 08/16/20 0802  GLUCAP 71 98 78 76 71    Microbiology: Results for orders placed or performed during the hospital encounter of 06/23/19  SARS CORONAVIRUS 2 (TAT 6-24 HRS) Nasopharyngeal Nasopharyngeal Swab     Status: None   Collection Time: 06/23/19  7:36 PM   Specimen: Nasopharyngeal Swab  Result Value Ref Range Status   SARS Coronavirus 2 NEGATIVE NEGATIVE Final    Comment: (NOTE) SARS-CoV-2 target nucleic acids are NOT DETECTED. The SARS-CoV-2 RNA is generally detectable in upper and lower respiratory specimens during the acute phase of infection. Negative results do not preclude SARS-CoV-2 infection, do not rule out co-infections with other pathogens, and should not be used as the sole basis for treatment or other patient management decisions. Negative results must be combined with clinical observations, patient history, and epidemiological information. The expected result is Negative. Fact Sheet for Patients: SugarRoll.be Fact Sheet for Healthcare Providers: https://www.woods-mathews.com/ This test  is not yet approved or cleared by the [place] and  has been authorized for detection and/or diagnosis of SARS-CoV-2 by FDA under an Emergency Use Authorization (EUA). This EUA will remain  in effect (meaning this test can be used) for the duration of the COVID-19 declaration under Section 56 4(b)(1) of the Act, 21 U.S.C. section 360bbb-3(b)(1), unless the authorization is terminated or revoked sooner. Performed at [facility], [place] [address], [place], [place] [address]   MRSA PCR Screening     Status: None   Collection Time: 06/25/19  3:26 PM    Specimen: Nasopharyngeal  Result Value Ref Range Status   MRSA by PCR NEGATIVE NEGATIVE Final    Comment:        The GeneXpert MRSA Assay (FDA approved for NASAL specimens only), is one component of a comprehensive MRSA colonization surveillance program. It is not intended to diagnose MRSA infection nor to guide or monitor treatment for MRSA infections. Performed at [facility], [address] [place],  [address]     Coagulation Studies: No results for input(s): LABPROT, INR in the last 72 hours.  Urinalysis: No results for input(s): COLORURINE, LABSPEC, PHURINE, GLUCOSEU, HGBUR, BILIRUBINUR, KETONESUR, PROTEINUR, UROBILINOGEN, NITRITE, LEUKOCYTESUR in the last 72 hours.  Invalid input(s): APPERANCEUR    Imaging: No results found.   Medications:    . apixaban  2.5 mg Oral BID  . atorvastatin  80 mg Oral Daily  . bisacodyl  10 mg Rectal Once  . carvedilol  25 mg Oral BID  . Chlorhexidine Gluconate Cloth  6 each Topical Q0600  . cinacalcet  60 mg Oral BID WC  . [START ON 08/18/2020] epoetin (EPOGEN/PROCRIT) injection  4,000 Units Intravenous Q T,Th,Sa-HD  . gabapentin  100 mg Oral BID  . midodrine  10 mg Oral TID WC  . multivitamin  1 tablet Oral QHS  . polyethylene glycol  17 g Oral Daily  . sevelamer carbonate  800 mg Oral TID WC  . sodium chloride flush  3 mL Intravenous Q12H  . torsemide  60 mg Oral Daily   acetaminophen **OR** acetaminophen, guaiFENesin, Ipratropium-Albuterol, ondansetron **OR** ondansetron (ZOFRAN) IV, simethicone  Assessment/ Plan:   [name] is a 75 y.o. black female with end stage renal disease on hemodialysis, rheumatoid arthritis, CVA, congestive heart failure, multiple myeloma, hypertension, hyperlipidemia, obstructive sleep apnea, who was admitted to [facility] on 08/07/2020 with COVID-19 pneumonia.  [facility] Nephrology [facility] MWF left AVF 122.5kg  1.  ESRD on HD MWF.   TTS Covid shift while  inpatient Wants to go to rehab as outpatient as she feels weak. Transitioned TTS schedule for outpatient COVID-19 cohort shift.  Outpatient [facility]  2.  Anemia of chronic kidney disease.  Lab Results  Component Value Date   HGB 10.7 (L) 08/15/2020   EPO supplementation with dialysis  3.  Secondary hyperparathyroidism.   - Continue cinacalcet and sevelamer with meals Lab Results  Component Value Date   PTH 345 (H) 08/10/2020   CALCIUM 9.2 08/15/2020   PHOS 6.1 (H) 08/15/2020      4.  COVID-19 infection Oxygen requirements: 2 L by nasal cannula    LOS: [place] 1/9/20223:05 PM

## 2020-08-17 DIAGNOSIS — N186 End stage renal disease: Secondary | ICD-10-CM | POA: Diagnosis not present

## 2020-08-17 DIAGNOSIS — Z992 Dependence on renal dialysis: Secondary | ICD-10-CM | POA: Diagnosis not present

## 2020-08-17 DIAGNOSIS — U071 COVID-19: Secondary | ICD-10-CM | POA: Diagnosis not present

## 2020-08-17 LAB — GLUCOSE, CAPILLARY
Glucose-Capillary: 71 mg/dL (ref 70–99)
Glucose-Capillary: 80 mg/dL (ref 70–99)

## 2020-08-17 MED ORDER — TRAZODONE HCL 50 MG PO TABS
50.0000 mg | ORAL_TABLET | Freq: Every evening | ORAL | Status: DC | PRN
  Administered 2020-08-17: 50 mg via ORAL
  Filled 2020-08-17: qty 1

## 2020-08-17 NOTE — TOC Transition Note (Addendum)
Transition of Care [facility]) - CM/SW Discharge Note   Patient Details  Name: DAWANA ASPER MRN: [id number] Date of Birth: 1945-08-12  Transition of Care [facility]) CM/SW Contact:  Magnus Ivan, LCSW Phone Number: 08/17/2020, 2:12 PM   Clinical Narrative:   Patient to discharge to [facility] today, Room 612B. Confirmed with Gerald Stabs at [facility]. Chris aware of Dialysis schedule. Chris asked for patient to have family bring her Cpap to SNF, CSW told patient who will call family about this. CSW updated MD, RN, patient. Asked RN to call report and MD to submit DC Summary. No COVID test needed since patient here with COVID. Medical Necessity Form and Face Sheet printed to Unit for Nurse Secretary to place in Discharge Packet by patient chart. Will call EMS when notified by RN that patient is ready for transport to SNF.  3:42- Notified by RN that patient is ready for transport. [facility]. Patient is first on their list. Updated Nurse Tech and RN.    Final next level of care: Skilled Nursing Facility Barriers to Discharge: Barriers Resolved   Patient Goals and CMS Choice Patient states their goals for this hospitalization and ongoing recovery are:: SNF rehab CMS Medicare.gov Compare Post Acute Care list provided to:: Patient Choice offered to / list presented to : Patient  Discharge Placement              Patient chooses bed at: [facility] Patient to be transferred to facility by: EMS Name of family member notified: Patient notified via phone. Patient and family notified of of transfer: 08/17/20  Discharge Plan and Services                                     Social Determinants of Health (SDOH) Interventions     Readmission Risk Interventions Readmission Risk Prevention Plan 08/11/2020 06/25/2019 05/14/2019  Transportation Screening Complete Complete Complete  PCP or Specialist Appt within 3-5 Days Complete Complete Complete  HRI or  Home Care Consult Complete Complete Complete  Social Work Consult for [place] Planning/Counseling Complete Complete Complete  Palliative Care Screening Not Applicable Not Applicable Not Applicable  Medication Review Press photographer) Complete Complete Complete  Some recent data might be hidden

## 2020-08-17 NOTE — Progress Notes (Signed)
Report called to [facility] nurse at [facility].  AVS and d/c summary in envelope to go to SNF with patient.

## 2020-08-17 NOTE — TOC Progression Note (Addendum)
Transition of Care [facility]) - Progression Note    Patient Details  Name: Cassandra Joseph MRN: [id number] Date of Birth: 1946/05/20  Transition of Care [facility]) CM/SW [place], LCSW Phone Number: 08/17/2020, 10:12 AM  Clinical Narrative:   Today is 10 days post positive COVID test. Sent out to local SNFs. Updated patient. Patient confirmed she has her own CPAP.  Spoke with Dialysis Coordinator Estill Bamberg who reported patient will go to [facility] until 08/27/20 (20 days after COVID test) then would resume at [facility] if asymptomatic.   SNF asking if patient is going to have dialysis at hospital today. Spoke to Nephrologist who said patient is not scheduled for dialysis today, would need it tomorrow.   Expected Discharge Plan: [place] Barriers to Discharge: Continued Medical Work up  Expected Discharge Plan and Services Expected Discharge Plan: [place] arrangements for the past 2 months: Single Family Home                                       Social Determinants of Health (SDOH) Interventions    Readmission Risk Interventions Readmission Risk Prevention Plan 08/11/2020 06/25/2019 05/14/2019  Transportation Screening Complete Complete Complete  PCP or Specialist Appt within 3-5 Days Complete Complete Complete  HRI or Home Care Consult Complete Complete Complete  Social Work Consult for [place] Planning/Counseling Complete Complete Complete  Palliative Care Screening Not Applicable Not Applicable Not Applicable  Medication Review Press photographer) Complete Complete Complete  Some recent data might be hidden

## 2020-08-17 NOTE — Progress Notes (Signed)
[facility]  ROUNDING NOTE   Subjective:   Denies any acute complaints Denies any nausea or vomiting or shortness of breath No leg edema Requiring oxygen supplementation at 2 L  Objective:  Vital signs in last 24 hours:  Temp:  [97.9 F (36.6 C)-98.7 F (37.1 C)] 98.7 F (37.1 C) (01/10 1148) Pulse Rate:  [52-64] 62 (01/10 1148) Resp:  [16-20] 18 (01/10 0802) BP: (96-145)/(42-95) 96/42 (01/10 1148) SpO2:  [96 %-100 %] 96 % (01/10 1148)  Weight change:  Filed Weights   08/13/20 0500 08/15/20 0500 08/16/20 0500  Weight: 123.2 kg (!) 259 kg 123.6 kg    Intake/Output: No intake/output data recorded.   Intake/Output this shift:  No intake/output data recorded.   Physical Exam: General:  No acute distress, sitting up in chair  HEENT  anicteric, moist oral mucous membrane  Pulm/lungs  normal breathing effort, [place] O2  CVS/Heart  regular rhythm, no rub or gallop  Abdomen:   Soft, nontender  Extremities:  trace peripheral edema  Neurologic:  Alert, oriented, able to follow commands  Skin:  No acute rashes  Rt arm AVF    Basic Metabolic Panel: Recent Labs  Lab 08/11/20 0324 08/12/20 0342 08/15/20 1002  NA 137 138 137  K 4.8 5.0 5.4*  CL 99 99 96*  CO2 27 26 27   GLUCOSE 80 76 106*  BUN 37* 54* 69*  CREATININE 6.50* 8.49* 9.86*  CALCIUM 8.8* 9.4 9.2  PHOS  --   --  6.1*    Liver Function Tests: Recent Labs  Lab 08/15/20 1002  ALBUMIN 3.1*   No results for input(s): LIPASE, AMYLASE in the last 168 hours. No results for input(s): AMMONIA in the last 168 hours.  CBC: Recent Labs  Lab 08/11/20 0324 08/12/20 0342 08/15/20 1002  WBC 5.0 4.9 4.8  HGB 10.5* 10.3* 10.7*  HCT 33.5* 32.2* 34.7*  MCV 98.0 97.6 98.9  PLT 180 181 207    Cardiac Enzymes: No results for input(s): CKTOTAL, CKMB, CKMBINDEX, TROPONINI in the last 168 hours.  BNP: Invalid input(s): POCBNP  CBG: Recent Labs  Lab 08/15/20 0840 08/15/20 1538 08/16/20 0802  08/17/20 0820 08/17/20 1208  GLUCAP 78 76 71 71 80    Microbiology: Results for orders placed or performed during the hospital encounter of 06/23/19  SARS CORONAVIRUS 2 (TAT 6-24 HRS) Nasopharyngeal Nasopharyngeal Swab     Status: None   Collection Time: 06/23/19  7:36 PM   Specimen: Nasopharyngeal Swab  Result Value Ref Range Status   SARS Coronavirus 2 NEGATIVE NEGATIVE Final    Comment: (NOTE) SARS-CoV-2 target nucleic acids are NOT DETECTED. The SARS-CoV-2 RNA is generally detectable in upper and lower respiratory specimens during the acute phase of infection. Negative results do not preclude SARS-CoV-2 infection, do not rule out co-infections with other pathogens, and should not be used as the sole basis for treatment or other patient management decisions. Negative results must be combined with clinical observations, patient history, and epidemiological information. The expected result is Negative. Fact Sheet for Patients: SugarRoll.be Fact Sheet for Healthcare Providers: https://www.woods-mathews.com/ This test is not yet approved or cleared by the [place] FDA and  has been authorized for detection and/or diagnosis of SARS-CoV-2 by FDA under an Emergency Use Authorization (EUA). This EUA will remain  in effect (meaning this test can be used) for the duration of the COVID-19 declaration under Section 56 4(b)(1) of the Act, 21 U.S.C. section 360bbb-3(b)(1), unless the authorization is terminated or  revoked sooner. Performed at [facility], [place] [address], [place], [place] [address]   MRSA PCR Screening     Status: None   Collection Time: 06/25/19  3:26 PM   Specimen: Nasopharyngeal  Result Value Ref Range Status   MRSA by PCR NEGATIVE NEGATIVE Final    Comment:        The GeneXpert MRSA Assay (FDA approved for NASAL specimens only), is one component of a comprehensive MRSA colonization surveillance program. It is  not intended to diagnose MRSA infection nor to guide or monitor treatment for MRSA infections. Performed at [facility], [address] [place],  [address]     Coagulation Studies: No results for input(s): LABPROT, INR in the last 72 hours.  Urinalysis: No results for input(s): COLORURINE, LABSPEC, PHURINE, GLUCOSEU, HGBUR, BILIRUBINUR, KETONESUR, PROTEINUR, UROBILINOGEN, NITRITE, LEUKOCYTESUR in the last 72 hours.  Invalid input(s): APPERANCEUR    Imaging: No results found.   Medications:    . apixaban  2.5 mg Oral BID  . atorvastatin  80 mg Oral Daily  . bisacodyl  10 mg Rectal Once  . carvedilol  25 mg Oral BID  . Chlorhexidine Gluconate Cloth  6 each Topical Q0600  . cinacalcet  60 mg Oral BID WC  . [START ON 08/18/2020] epoetin (EPOGEN/PROCRIT) injection  4,000 Units Intravenous Q T,Th,Sa-HD  . gabapentin  100 mg Oral BID  . midodrine  10 mg Oral TID WC  . multivitamin  1 tablet Oral QHS  . polyethylene glycol  17 g Oral Daily  . sevelamer carbonate  800 mg Oral TID WC  . sodium chloride flush  3 mL Intravenous Q12H  . torsemide  60 mg Oral Daily   acetaminophen **OR** acetaminophen, guaiFENesin, Ipratropium-Albuterol, ondansetron **OR** ondansetron (ZOFRAN) IV, simethicone, traZODone  Assessment/ Plan:   Cassandra Joseph is a 75 y.o. black female with end stage renal disease on hemodialysis, rheumatoid arthritis, CVA, congestive heart failure, multiple myeloma, hypertension, hyperlipidemia, obstructive sleep apnea, who was admitted to [facility] on 08/07/2020 with COVID-19 pneumonia.  [facility] [address] MWF left AVF 122.5kg  1.  ESRD on HD MWF.   TTS Covid shift while inpatient scheduled to go to rehab as outpatient as she feels weak. Transitioned TTS schedule for outpatient COVID-19 cohort shift.  Outpatient [facility]  2.  Anemia of chronic kidney disease.  Lab Results  Component Value Date   HGB 10.7 (L) 08/15/2020    EPO supplementation with dialysis  3.  Secondary hyperparathyroidism.   - Continue cinacalcet and sevelamer with meals Lab Results  Component Value Date   PTH 345 (H) 08/10/2020   CALCIUM 9.2 08/15/2020   PHOS 6.1 (H) 08/15/2020      4.  COVID-19 infection Oxygen requirements: 2 L by nasal cannula    LOS: 9 Cassandra Joseph 1/10/20222:22 PM

## 2020-08-17 NOTE — NC FL2 (Signed)
[place] LEVEL OF CARE SCREENING TOOL     IDENTIFICATION  Patient Name: Cassandra Joseph Birthdate: Feb 06, 1946 Sex: female Admission Date (Current Location): 08/07/2020  [place] and [place] Number:  Engineering geologist and Address:  [facility], [address] [place], [place] [address]      Provider Number: [id number]  Attending Physician Name and Address:  Caren Griffins, MD  Relative Name and Phone Number:  Nicola Police (Daughter)   [phone] [facility])    Current Level of Care: Hospital Recommended Level of Care: [place] Prior Approval Number:    Date Approved/Denied:   PASRR Number: [id number]  Discharge Plan: SNF    Current Diagnoses: Patient Active Problem List   Diagnosis Date Noted  . Viral pneumonia 08/08/2020  . Pneumonia due to COVID-19 virus 08/07/2020  . History of anemia due to chronic kidney disease   . ESRD (end stage renal disease) ([place])   . SOB (shortness of breath)   . Acute pulmonary edema ([place]) 06/23/2019  . Acute on chronic diastolic CHF (congestive heart failure) ([place]) 01/18/2017  . HLD (hyperlipidemia) 01/18/2017  . Atypical chest pain 11/09/2016  . ESRD on hemodialysis ([place]) 11/09/2016  . Morbid obesity ([place]) 11/09/2016  . Essential hypertension 11/09/2016  . Multiple myeloma ([place]) 11/09/2016  . Elevated troponin 11/09/2016  . Metabolic syndrome 84/13/2440    Orientation RESPIRATION BLADDER Height & Weight     Self,Time,Situation,Place  O2 Continent Weight: 272 lb 7.8 oz (123.6 kg) Height:  5' 3"  (160 cm)  BEHAVIORAL SYMPTOMS/MOOD NEUROLOGICAL BOWEL NUTRITION STATUS      Continent Diet (renal with 1200 ml fluid restrictions)  AMBULATORY STATUS COMMUNICATION OF NEEDS Skin   Extensive Assist Verbally Normal                       Personal Care Assistance Level of Assistance  Bathing,Feeding,Dressing Bathing Assistance: Maximum assistance Feeding assistance:  Limited assistance Dressing Assistance: Maximum assistance     Functional Limitations Info             SPECIAL CARE FACTORS FREQUENCY                       Contractures      Additional Factors Info  Code Status,Allergies Code Status Info: full Allergies Info: shellfish           Current Medications (08/17/2020):  This is the current hospital active medication list Current Facility-Administered Medications  Medication Dose Route Frequency Provider Last Rate Last Admin  . acetaminophen (TYLENOL) tablet 650 mg  650 mg Oral Q6H PRN Clance Boll, MD       Or  . acetaminophen (TYLENOL) suppository 650 mg  650 mg Rectal Q6H PRN Clance Boll, MD      . apixaban Arne Cleveland) tablet 2.5 mg  2.5 mg Oral BID Myles Rosenthal A, MD   2.5 mg at 08/17/20 0841  . atorvastatin (LIPITOR) tablet 80 mg  80 mg Oral Daily Myles Rosenthal A, MD   80 mg at 08/17/20 0841  . bisacodyl (DULCOLAX) suppository 10 mg  10 mg Rectal Once Caren Griffins, MD      . carvedilol (COREG) tablet 25 mg  25 mg Oral BID Myles Rosenthal A, MD   25 mg at 08/17/20 0841  . Chlorhexidine Gluconate Cloth 2 % PADS 6 each  6 each Topical Q0600 Anthonette Legato, MD   6 each at 08/15/20 0651  .  cinacalcet (SENSIPAR) tablet 60 mg  60 mg Oral BID WC Myles Rosenthal A, MD   60 mg at 08/17/20 0841  . [START ON 08/18/2020] epoetin alfa (EPOGEN) injection 4,000 Units  4,000 Units Intravenous Q T,Th,Sa-HD Singh, Harmeet, MD      . gabapentin (NEURONTIN) capsule 100 mg  100 mg Oral BID Myles Rosenthal A, MD   100 mg at 08/17/20 0841  . guaiFENesin (ROBITUSSIN) 100 MG/5ML solution 100 mg  5 mL Oral Q4H PRN Myles Rosenthal A, MD   100 mg at 08/10/20 1230  . Ipratropium-Albuterol (COMBIVENT) respimat 2 puff  2 puff Inhalation Q6H PRN Myles Rosenthal A, MD      . midodrine (PROAMATINE) tablet 10 mg  10 mg Oral TID WC Myles Rosenthal A, MD   10 mg at 08/17/20 0841  . multivitamin (RENA-VIT) tablet 1 tablet   1 tablet Oral QHS Clance Boll, MD   1 tablet at 08/16/20 2304  . ondansetron (ZOFRAN) tablet 4 mg  4 mg Oral Q6H PRN Clance Boll, MD       Or  . ondansetron [facility]) injection 4 mg  4 mg Intravenous Q6H PRN Clance Boll, MD   4 mg at 08/12/20 1727  . polyethylene glycol (MIRALAX / GLYCOLAX) packet 17 g  17 g Oral Daily Caren Griffins, MD   17 g at 08/16/20 0141  . sevelamer carbonate (RENVELA) tablet 800 mg  800 mg Oral TID WC Lateef, Munso[place], MD   800 mg at 08/17/20 0841  . simethicone (MYLICON) chewable tablet 80 mg  80 mg Oral QID PRN Mansy, Jan A, MD   80 mg at 08/10/20 2318  . sodium chloride flush (NS) 0.9 % injection 3 mL  3 mL Intravenous Q12H Myles Rosenthal A, MD   3 mL at 08/16/20 2304  . torsemide (DEMADEX) tablet 60 mg  60 mg Oral Daily Myles Rosenthal A, MD   60 mg at 08/17/20 0841  . traZODone (DESYREL) tablet 50 mg  50 mg Oral QHS PRN Lang Snow, NP   50 mg at 08/17/20 0049     Discharge Medications: Please see discharge summary for a list of discharge medications.  Relevant Imaging Results:  Relevant Lab Results:   Additional Information SS #: [id number]  [place], LCSW

## 2020-08-17 NOTE — Care Management Important Message (Signed)
Important Message  Patient Details  Name: CADENCE MINTON MRN: [id number] Date of Birth: 05-29-1946   Medicare Important Message Given:  Yes  Reviewed with patient via room phone due to isolation status.  Copy of Medicare IM to be delivered to patient via nursing staff.    Dannette Barbara 08/17/2020, 2:14 PM

## 2020-08-17 NOTE — Discharge Summary (Signed)
Physician Discharge Summary  Cassandra Joseph LTJ:[id number] DOB: [birth year] DOA: 08/07/2020  PCP: Tracie Harrier, MD  Admit date: 08/07/2020 Discharge date: 08/17/2020  Admitted From: home Disposition:  SNF  Recommendations for Outpatient Follow-up:  1. Follow up with PCP in 1-2 weeks  Home Health: none Equipment/Devices: none  Discharge Condition: stable CODE STATUS: Full code Diet recommendation: renal   HPI: Per admitting MD, Cassandra Joseph is a 75 y.o. female with medical history significant of  ESRD on HD MWF, HTN, HLD,RA, TIA/CVA, Multiple Myeloma, OSA on cpap qhs, persistent atrial fibrillation, anticoagulated with Eliquis, chronic HFpEF on supplemental O2, mitral valve disease, who presents with 4-5 days of increasing fatigue ,cough with productive clear -yellow sputum, +fever with sweats as well as DOE. She presents to ed today due to progressive symptoms and in ability to complete her ADLS due to being so weak.  On further ros, patient also notes pleuritic chest pain, no diarrhea, no muscles aches but does have abnormal sense of taste and smell and also noted poor appetite. She is fully vaccinated , last shot few months back. On evaluation in ed patient found to be COVID + , cxr with noted infiltrates but no change from her baseline hypoxemia. In ed patient is actually stating 95% on 2L instead of prescribed 3 at home. Patient is admitted due to unsafe d/c due to associated weakness with viral syndrome and inability to take care of her self at home.  Hospital Course / Discharge diagnoses: Principal Problem Chronic Hypoxic Respiratory Failure, Covid-19 Viral Illness -s/p 5 days of remdesivir, she is chronically on 3 L, appears at baseline in terms of her hypoxia and she was not started on steroids. Respiratory status appears at baseline, stable. 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 Hb stable Generalized weakness -PT/OT recs SNF.  Chronic diastolic CHF -continue on home medications HLD-continue on statin RA-not on any meds for RA  Likely PAF-continue on eliquis, carvedilol Hx ofTIA/CVA-continue on statin  Multiple myeloma-management per onc as an outpatient  Morbid obesity: BMI is 42.7-Complicates overall care and prognosis   Sepsis ruled out   Discharge Instructions   Allergies as of 08/17/2020      Reactions   Shellfish Allergy Shortness Of Breath      Medication List    TAKE these medications   apixaban 2.5 MG Tabs tablet Commonly known as: ELIQUIS Take 1 tablet (2.5 mg total) by mouth 2 (two) times daily.   aspirin EC 81 MG tablet Take 81 mg by mouth daily.   atorvastatin 80 MG tablet Commonly known as: LIPITOR Take 1 tablet (80 mg total) by mouth daily.   b complex-vitamin c-folic acid 0.8 MG Tabs tablet Take 1 tablet by mouth daily.   carvedilol 25 MG tablet Commonly known as: COREG Take 25 mg by mouth 2 (two) times daily.   cinacalcet 60 MG tablet Commonly known as: SENSIPAR Take 60 mg by mouth 2 (two) times daily.   gabapentin 300 MG capsule Commonly known as: NEURONTIN Take 300 mg by mouth 3 (three) times daily. What changed: Another medication with the same name was removed. Continue taking this medication, and follow the directions you see here.   midodrine 10 MG tablet Commonly known as: PROAMATINE Take 1 tablet (10 mg total) by mouth 3 (three) times daily with meals.   sevelamer carbonate 800 MG tablet Commonly known as: RENVELA  Take 800 mg by mouth 4 (four) times daily.   torsemide 20 MG tablet Commonly known as: DEMADEX Take 3 tablets (60 mg total) by mouth daily.       Consultations:  Nephrology   Procedures/Studies:  [facility] Chest Port 1 View  Result Date: 08/07/2020 CLINICAL DATA:  Shortness of breath.  COVID positive. EXAM: PORTABLE CHEST 1 VIEW COMPARISON:  06/23/2019 FINDINGS:  Cardiac enlargement. Perihilar and basilar infiltrates probably due to edema but could represent multifocal pneumonia. Elevation of the right hemidiaphragm with linear atelectasis in the right lung base. Possible small right pleural effusion. Similar appearance to previous study. IMPRESSION: Cardiac enlargement with perihilar and basilar infiltrates, likely edema but could represent multifocal pneumonia. Electronically Signed   By: Lucienne Capers M.D.   On: 08/07/2020 18:11   ECHOCARDIOGRAM COMPLETE  Result Date: 08/11/2020    ECHOCARDIOGRAM REPORT   Patient Name:   Cassandra Joseph Date of Exam: 08/11/2020 Medical Rec #:  [id number]        Height:       63.0 in Accession #:    [id number]       Weight:       269.3 lb Date of Birth:  October 30, 1945        BSA:          2.194 m Patient Age:    76 years         BP:           128/80 mmHg Patient Gender: F                HR:           45 bpm. Exam Location:  [facility] Procedure: 2D Echo, Color Doppler and Cardiac Doppler Indications:     I48.91 Atrial Fibrillation; R94.31 Abnormal ECG  History:         Patient has prior history of Echocardiogram examinations, most                  recent 06/24/2019. TIA and ESRD; Risk Factors:Hypertension and                  Dyslipidemia. Pt tested positive for COVID-19 on 08/07/20.  Sonographer:     Charmayne Sheer RDCS (AE) Referring Phys:  [id number] Wyvonnia Dusky Diagnosing Phys: Serafina Royals MD  Sonographer Comments: Suboptimal subcostal window. IMPRESSIONS  1. Left ventricular ejection fraction, by estimation, is 60 to 65%. The left ventricle has normal function. The left ventricle has no regional wall motion abnormalities. Left ventricular diastolic parameters were normal.  2. Right ventricular systolic function is normal. The right ventricular size is normal.  3. Left atrial size was moderately dilated.  4. Right atrial size was mildly dilated.  5. The mitral valve is abnormal. Moderate mitral valve regurgitation. Mild mitral stenosis.   6. Tricuspid valve regurgitation is moderate.  7. The aortic valve is normal in structure. Aortic valve regurgitation is mild. Mild aortic valve sclerosis is present, with no evidence of aortic valve stenosis. FINDINGS  Left Ventricle: Left ventricular ejection fraction, by estimation, is 60 to 65%. The left ventricle has normal function. The left ventricle has no regional wall motion abnormalities. The left ventricular internal cavity size was normal in size. There is  no left ventricular hypertrophy. Left ventricular diastolic parameters were normal. Right Ventricle: The right ventricular size is normal. No increase in right ventricular wall thickness. Right ventricular systolic function is normal. Left Atrium: Left atrial size was moderately dilated.  Right Atrium: Right atrial size was mildly dilated. Pericardium: There is no evidence of pericardial effusion. Mitral Valve: The mitral valve is abnormal. Moderate mitral valve regurgitation. Mild mitral valve stenosis. MV peak gradient, 20.8 mmHg. The mean mitral valve gradient is 6.0 mmHg. Tricuspid Valve: The tricuspid valve is normal in structure. Tricuspid valve regurgitation is moderate. Aortic Valve: The aortic valve is normal in structure. Aortic valve regurgitation is mild. Mild aortic valve sclerosis is present, with no evidence of aortic valve stenosis. Aortic valve mean gradient measures 7.0 mmHg. Aortic valve peak gradient measures 15.0 mmHg. Aortic valve area, by VTI measures 2.03 cm. Pulmonic Valve: The pulmonic valve was normal in structure. Pulmonic valve regurgitation is not visualized. Aorta: The aortic root and ascending aorta are structurally normal, with no evidence of dilitation. IAS/Shunts: No atrial level shunt detected by color flow Doppler.  LEFT VENTRICLE PLAX 2D LVIDd:         4.50 cm  Diastology LVIDs:         3.30 cm  LV e' lateral:   6.09 cm/s LV PW:         1.40 cm  LV E/e' lateral: 30.5 LV IVS:        1.10 cm LVOT diam:     2.20 cm  LV SV:         84 LV SV Index:   38 LVOT Area:     3.80 cm  RIGHT VENTRICLE RV Basal diam:  4.70 cm TAPSE (M-mode): 2.2 cm LEFT ATRIUM            Index       RIGHT ATRIUM           Index LA diam:      5.90 cm  2.69 cm/m  RA Area:     22.80 cm LA Vol (A4C): 125.0 ml 56.97 ml/m RA Volume:   75.10 ml  34.23 ml/m  AORTIC VALVE                    PULMONIC VALVE AV Area (Vmax):    2.04 cm     PV Vmax:       1.42 m/s AV Area (Vmean):   2.03 cm     PV Vmean:      83.100 cm/s AV Area (VTI):     2.03 cm     PV VTI:        0.330 m AV Vmax:           193.50 cm/s  PV Peak grad:  8.1 mmHg AV Vmean:          125.000 cm/s PV Mean grad:  3.0 mmHg AV VTI:            0.413 m AV Peak Grad:      15.0 mmHg AV Mean Grad:      7.0 mmHg LVOT Vmax:         104.00 cm/s LVOT Vmean:        66.900 cm/s LVOT VTI:          0.221 m LVOT/AV VTI ratio: 0.54  AORTA Ao Root diam: 3.10 cm MITRAL VALVE MV Area (PHT): 2.36 cm     SHUNTS MV Peak grad:  20.8 mmHg    Systemic VTI:  0.22 m MV Mean grad:  6.0 mmHg     Systemic Diam: 2.20 cm MV Vmax:       2.28 m/s MV Vmean:      111.0 cm/s  MV Decel Time: 321 msec MV E velocity: 186.00 cm/s Serafina Royals MD Electronically signed by Serafina Royals MD Signature Date/Time: 08/11/2020/5:08:10 PM    Final       Subjective: - no chest pain, shortness of breath, no abdominal pain, nausea or vomiting.   Discharge Exam: BP (!) 96/42 (BP Location: Left Arm)   Pulse 62   Temp 98.7 F (37.1 C)   Resp 18   Ht 5' 3"  (1.6 m)   Wt 123.6 kg   SpO2 96%   BMI 48.27 kg/m   General: Pt is alert, awake, not in acute distress Cardiovascular: RRR, S1/S2 +, no rubs, no gallops Respiratory: CTA bilaterally, no wheezing, no rhonchi Abdominal: Soft, NT, ND, bowel sounds + Extremities: no edema, no cyanosis    The results of significant diagnostics from this hospitalization (including imaging, microbiology, ancillary and laboratory) are listed below for reference.     Microbiology: No results found  for this or any previous visit (from the past 240 hour(s)).   Labs: Basic Metabolic Panel: Recent Labs  Lab 08/11/20 0324 08/12/20 0342 08/15/20 1002  NA 137 138 137  K 4.8 5.0 5.4*  CL 99 99 96*  CO2 27 26 27   GLUCOSE 80 76 106*  BUN 37* 54* 69*  CREATININE 6.50* 8.49* 9.86*  CALCIUM 8.8* 9.4 9.2  PHOS  --   --  6.1*   Liver Function Tests: Recent Labs  Lab 08/15/20 1002  ALBUMIN 3.1*   CBC: Recent Labs  Lab 08/11/20 0324 08/12/20 0342 08/15/20 1002  WBC 5.0 4.9 4.8  HGB 10.5* 10.3* 10.7*  HCT 33.5* 32.2* 34.7*  MCV 98.0 97.6 98.9  PLT 180 181 207   CBG: Recent Labs  Lab 08/15/20 0840 08/15/20 1538 08/16/20 0802 08/17/20 0820 08/17/20 1208  GLUCAP 78 76 71 71 80   Hgb A1c No results for input(s): HGBA1C in the last 72 hours. Lipid Profile No results for input(s): CHOL, HDL, LDLCALC, TRIG, CHOLHDL, LDLDIRECT in the last 72 hours. Thyroid function studies No results for input(s): TSH, T4TOTAL, T3FREE, THYROIDAB in the last 72 hours.  Invalid input(s): FREET3 Urinalysis No results found for: COLORURINE, APPEARANCEUR, LABSPEC, PHURINE, GLUCOSEU, HGBUR, BILIRUBINUR, KETONESUR, PROTEINUR, UROBILINOGEN, NITRITE, LEUKOCYTESUR  FURTHER DISCHARGE INSTRUCTIONS:   Get Medicines reviewed and adjusted: Please take all your medications with you for your next visit with your Primary MD   Laboratory/radiological data: Please request your Primary MD to go over all hospital tests and procedure/radiological results at the follow up, please ask your Primary MD to get all Hospital records sent to his/her office.   In some cases, they will be blood work, cultures and biopsy results pending at the time of your discharge. Please request that your primary care M.D. goes through all the records of your hospital data and follows up on these results.   Also Note the following: If you experience worsening of your admission symptoms, develop shortness of breath, life  threatening emergency, suicidal or homicidal thoughts you must seek medical attention immediately by calling 911 or calling your MD immediately  if symptoms less severe.   You must read complete instructions/literature along with all the possible adverse reactions/side effects for all the Medicines you take and that have been prescribed to you. Take any new Medicines after you have completely understood and accpet all the possible adverse reactions/side effects.    Do not drive when taking Pain medications or sleeping medications (Benzodaizepines)   Do not take more than prescribed  Pain, Sleep and Anxiety Medications. It is not advisable to combine anxiety,sleep and pain medications without talking with your primary care practitioner   Special Instructions: If you have smoked or chewed Tobacco  in the last 2 yrs please stop smoking, stop any regular Alcohol  and or any Recreational drug use.   Wear Seat belts while driving.   Please note: You were cared for by a hospitalist during your hospital stay. Once you are discharged, your primary care physician will handle any further medical issues. Please note that NO REFILLS for any discharge medications will be authorized once you are discharged, as it is imperative that you return to your primary care physician (or establish a relationship with a primary care physician if you do not have one) for your post hospital discharge needs so that they can reassess your need for medications and monitor your lab values.  Time coordinating discharge: 40 minutes  SIGNED:  Marzetta Board, MD, PhD 08/17/2020, 1:31 PM

## 2020-09-27 ENCOUNTER — Emergency Department: Payer: Medicare Other

## 2020-09-27 ENCOUNTER — Inpatient Hospital Stay
Admission: EM | Admit: 2020-09-27 | Discharge: 2020-10-05 | DRG: 564 | Disposition: A | Discharge status: Skilled Nursing Facility | Payer: Medicare Other | Attending: Internal Medicine | Admitting: Internal Medicine

## 2020-09-27 ENCOUNTER — Emergency Department: Admission: EM | Admit: 2020-09-27 | Discharge: 2020-09-27 | Discharge status: Absent without leave | Payer: Self-pay

## 2020-09-27 ENCOUNTER — Other Ambulatory Visit: Payer: Self-pay

## 2020-09-27 ENCOUNTER — Encounter: Payer: Self-pay | Admitting: Intensive Care

## 2020-09-27 DIAGNOSIS — M79672 Pain in left foot: Secondary | ICD-10-CM | POA: Diagnosis present

## 2020-09-27 DIAGNOSIS — M009 Pyogenic arthritis, unspecified: Secondary | ICD-10-CM

## 2020-09-27 DIAGNOSIS — Z90710 Acquired absence of both cervix and uterus: Secondary | ICD-10-CM

## 2020-09-27 DIAGNOSIS — I480 Paroxysmal atrial fibrillation: Secondary | ICD-10-CM | POA: Diagnosis present

## 2020-09-27 DIAGNOSIS — Z87891 Personal history of nicotine dependence: Secondary | ICD-10-CM

## 2020-09-27 DIAGNOSIS — M109 Gout, unspecified: Secondary | ICD-10-CM | POA: Diagnosis present

## 2020-09-27 DIAGNOSIS — M059 Rheumatoid arthritis with rheumatoid factor, unspecified: Secondary | ICD-10-CM | POA: Diagnosis present

## 2020-09-27 DIAGNOSIS — Z89422 Acquired absence of other left toe(s): Secondary | ICD-10-CM

## 2020-09-27 DIAGNOSIS — Z8249 Family history of ischemic heart disease and other diseases of the circulatory system: Secondary | ICD-10-CM

## 2020-09-27 DIAGNOSIS — Z79899 Other long term (current) drug therapy: Secondary | ICD-10-CM

## 2020-09-27 DIAGNOSIS — Z20822 Contact with and (suspected) exposure to covid-19: Secondary | ICD-10-CM | POA: Diagnosis present

## 2020-09-27 DIAGNOSIS — G629 Polyneuropathy, unspecified: Secondary | ICD-10-CM | POA: Diagnosis present

## 2020-09-27 DIAGNOSIS — I10 Essential (primary) hypertension: Secondary | ICD-10-CM | POA: Diagnosis not present

## 2020-09-27 DIAGNOSIS — M25452 Effusion, left hip: Principal | ICD-10-CM | POA: Diagnosis present

## 2020-09-27 DIAGNOSIS — J9611 Chronic respiratory failure with hypoxia: Secondary | ICD-10-CM | POA: Diagnosis present

## 2020-09-27 DIAGNOSIS — M0579 Rheumatoid arthritis with rheumatoid factor of multiple sites without organ or systems involvement: Secondary | ICD-10-CM

## 2020-09-27 DIAGNOSIS — K5900 Constipation, unspecified: Secondary | ICD-10-CM | POA: Diagnosis not present

## 2020-09-27 DIAGNOSIS — Z91013 Allergy to seafood: Secondary | ICD-10-CM

## 2020-09-27 DIAGNOSIS — C9000 Multiple myeloma not having achieved remission: Secondary | ICD-10-CM | POA: Diagnosis present

## 2020-09-27 DIAGNOSIS — M79605 Pain in left leg: Secondary | ICD-10-CM | POA: Diagnosis not present

## 2020-09-27 DIAGNOSIS — C9001 Multiple myeloma in remission: Secondary | ICD-10-CM

## 2020-09-27 DIAGNOSIS — Z7982 Long term (current) use of aspirin: Secondary | ICD-10-CM

## 2020-09-27 DIAGNOSIS — Z808 Family history of malignant neoplasm of other organs or systems: Secondary | ICD-10-CM

## 2020-09-27 DIAGNOSIS — G4730 Sleep apnea, unspecified: Secondary | ICD-10-CM | POA: Diagnosis present

## 2020-09-27 DIAGNOSIS — N186 End stage renal disease: Secondary | ICD-10-CM | POA: Diagnosis present

## 2020-09-27 DIAGNOSIS — N2581 Secondary hyperparathyroidism of renal origin: Secondary | ICD-10-CM | POA: Diagnosis present

## 2020-09-27 DIAGNOSIS — Z6841 Body Mass Index (BMI) 40.0 and over, adult: Secondary | ICD-10-CM

## 2020-09-27 DIAGNOSIS — Z7901 Long term (current) use of anticoagulants: Secondary | ICD-10-CM

## 2020-09-27 DIAGNOSIS — Z90722 Acquired absence of ovaries, bilateral: Secondary | ICD-10-CM

## 2020-09-27 DIAGNOSIS — E785 Hyperlipidemia, unspecified: Secondary | ICD-10-CM | POA: Diagnosis present

## 2020-09-27 DIAGNOSIS — E782 Mixed hyperlipidemia: Secondary | ICD-10-CM

## 2020-09-27 DIAGNOSIS — I959 Hypotension, unspecified: Secondary | ICD-10-CM | POA: Diagnosis not present

## 2020-09-27 DIAGNOSIS — E8889 Other specified metabolic disorders: Secondary | ICD-10-CM | POA: Diagnosis present

## 2020-09-27 DIAGNOSIS — Z992 Dependence on renal dialysis: Secondary | ICD-10-CM

## 2020-09-27 DIAGNOSIS — I2510 Atherosclerotic heart disease of native coronary artery without angina pectoris: Secondary | ICD-10-CM | POA: Diagnosis present

## 2020-09-27 DIAGNOSIS — W19XXXA Unspecified fall, initial encounter: Secondary | ICD-10-CM | POA: Diagnosis present

## 2020-09-27 DIAGNOSIS — D631 Anemia in chronic kidney disease: Secondary | ICD-10-CM | POA: Diagnosis present

## 2020-09-27 DIAGNOSIS — E041 Nontoxic single thyroid nodule: Secondary | ICD-10-CM | POA: Diagnosis present

## 2020-09-27 DIAGNOSIS — Z8673 Personal history of transient ischemic attack (TIA), and cerebral infarction without residual deficits: Secondary | ICD-10-CM

## 2020-09-27 DIAGNOSIS — I120 Hypertensive chronic kidney disease with stage 5 chronic kidney disease or end stage renal disease: Secondary | ICD-10-CM | POA: Diagnosis present

## 2020-09-27 DIAGNOSIS — R531 Weakness: Secondary | ICD-10-CM | POA: Diagnosis present

## 2020-09-27 HISTORY — DX: Heart failure, unspecified: I50.9

## 2020-09-27 HISTORY — DX: Pyogenic arthritis, unspecified: M00.9

## 2020-09-27 LAB — CBC WITH DIFFERENTIAL/PLATELET
Abs Immature Granulocytes: 0.02 10*3/uL (ref 0.00–0.07)
Basophils Absolute: 0 10*3/uL (ref 0.0–0.1)
Basophils Relative: 1 %
Eosinophils Absolute: 0.2 10*3/uL (ref 0.0–0.5)
Eosinophils Relative: 2 %
HCT: 36.7 % (ref 36.0–46.0)
Hemoglobin: 11.1 g/dL — ABNORMAL LOW (ref 12.0–15.0)
Immature Granulocytes: 0 %
Lymphocytes Relative: 21 %
Lymphs Abs: 1.5 10*3/uL (ref 0.7–4.0)
MCH: 30.7 pg (ref 26.0–34.0)
MCHC: 30.2 g/dL (ref 30.0–36.0)
MCV: 101.7 fL — ABNORMAL HIGH (ref 80.0–100.0)
Monocytes Absolute: 0.8 10*3/uL (ref 0.1–1.0)
Monocytes Relative: 10 %
Neutro Abs: 5 10*3/uL (ref 1.7–7.7)
Neutrophils Relative %: 66 %
Platelets: 260 10*3/uL (ref 150–400)
RBC: 3.61 MIL/uL — ABNORMAL LOW (ref 3.87–5.11)
RDW: 16 % — ABNORMAL HIGH (ref 11.5–15.5)
WBC: 7.5 10*3/uL (ref 4.0–10.5)
nRBC: 0 % (ref 0.0–0.2)

## 2020-09-27 LAB — BASIC METABOLIC PANEL
Anion gap: 15 (ref 5–15)
BUN: 45 mg/dL — ABNORMAL HIGH (ref 8–23)
CO2: 26 mmol/L (ref 22–32)
Calcium: 12 mg/dL — ABNORMAL HIGH (ref 8.9–10.3)
Chloride: 94 mmol/L — ABNORMAL LOW (ref 98–111)
Creatinine, Ser: 7.22 mg/dL — ABNORMAL HIGH (ref 0.44–1.00)
GFR, Estimated: 6 mL/min — ABNORMAL LOW (ref >60)
Glucose, Bld: 79 mg/dL (ref 70–99)
Potassium: 4.6 mmol/L (ref 3.5–5.1)
Sodium: 135 mmol/L (ref 135–145)

## 2020-09-27 LAB — C-REACTIVE PROTEIN: CRP: 9.2 mg/dL — ABNORMAL HIGH (ref <1.0)

## 2020-09-27 LAB — RESP PANEL BY RT-PCR (FLU A&B, COVID) ARPGX2
Influenza A by PCR: NEGATIVE
Influenza B by PCR: NEGATIVE
SARS Coronavirus 2 by RT PCR: NEGATIVE

## 2020-09-27 LAB — SEDIMENTATION RATE: Sed Rate: 60 mm/hr — ABNORMAL HIGH (ref 0–30)

## 2020-09-27 MED ORDER — ONDANSETRON HCL 4 MG/2ML IJ SOLN: 4.0000 mg | Freq: Four times a day (QID) | INTRAMUSCULAR | Status: DC | PRN

## 2020-09-27 MED ORDER — CARVEDILOL 25 MG PO TABS
25.0000 mg | ORAL_TABLET | Freq: Two times a day (BID) | ORAL | Status: DC
  Administered 2020-09-28 – 2020-09-30 (×5): 25 mg via ORAL
  Filled 2020-09-27 (×6): qty 1

## 2020-09-27 MED ORDER — MORPHINE SULFATE (PF) 2 MG/ML IV SOLN: 1.0000 mg | INTRAVENOUS | Status: DC | PRN

## 2020-09-27 MED ORDER — GABAPENTIN 300 MG PO CAPS
300.0000 mg | ORAL_CAPSULE | Freq: Three times a day (TID) | ORAL | Status: DC
  Administered 2020-09-28 – 2020-10-05 (×23): 300 mg via ORAL
  Filled 2020-09-27 (×23): qty 1

## 2020-09-27 MED ORDER — HYDROCODONE-ACETAMINOPHEN 5-325 MG PO TABS: 1.0000 | ORAL_TABLET | Freq: Four times a day (QID) | ORAL | Status: AC | PRN

## 2020-09-27 MED ORDER — ONDANSETRON HCL 4 MG PO TABS: 4.0000 mg | ORAL_TABLET | Freq: Four times a day (QID) | ORAL | Status: DC | PRN

## 2020-09-27 MED ORDER — ACETAMINOPHEN 500 MG PO TABS
1000.0000 mg | ORAL_TABLET | Freq: Once | ORAL | Status: AC
  Administered 2020-09-27: 1000 mg via ORAL
  Filled 2020-09-27: qty 2

## 2020-09-27 MED ORDER — CINACALCET HCL 30 MG PO TABS
60.0000 mg | ORAL_TABLET | Freq: Two times a day (BID) | ORAL | Status: DC
  Filled 2020-09-27 (×2): qty 2

## 2020-09-27 MED ORDER — HEPARIN SODIUM (PORCINE) 5000 UNIT/ML IJ SOLN
5000.0000 [IU] | Freq: Three times a day (TID) | INTRAMUSCULAR | Status: DC
  Administered 2020-09-28 (×4): 5000 [IU] via SUBCUTANEOUS
  Filled 2020-09-27 (×4): qty 1

## 2020-09-27 MED ORDER — ACETAMINOPHEN 325 MG PO TABS: 325.0000 mg | ORAL_TABLET | Freq: Four times a day (QID) | ORAL | Status: DC | PRN

## 2020-09-27 MED ORDER — CINACALCET HCL 30 MG PO TABS
60.0000 mg | ORAL_TABLET | Freq: Two times a day (BID) | ORAL | Status: DC
  Administered 2020-09-28 – 2020-10-04 (×14): 60 mg via ORAL
  Filled 2020-09-27 (×17): qty 2

## 2020-09-27 MED ORDER — ACETAMINOPHEN 325 MG PO TABS: 650.0000 mg | ORAL_TABLET | Freq: Four times a day (QID) | ORAL | Status: AC | PRN

## 2020-09-27 MED ORDER — SEVELAMER CARBONATE 800 MG PO TABS
800.0000 mg | ORAL_TABLET | Freq: Four times a day (QID) | ORAL | Status: DC
  Administered 2020-09-28 – 2020-10-05 (×26): 800 mg via ORAL
  Filled 2020-09-27 (×34): qty 1

## 2020-09-27 MED ORDER — IRBESARTAN 150 MG PO TABS
300.0000 mg | ORAL_TABLET | Freq: Every day | ORAL | Status: DC
  Administered 2020-09-28 – 2020-10-01 (×5): 300 mg via ORAL
  Filled 2020-09-27 (×6): qty 2

## 2020-09-27 MED ORDER — ACETAMINOPHEN 325 MG RE SUPP: 325.0000 mg | Freq: Four times a day (QID) | RECTAL | Status: DC | PRN

## 2020-09-27 MED ORDER — ACETAMINOPHEN 650 MG RE SUPP: 325.0000 mg | Freq: Four times a day (QID) | RECTAL | Status: AC | PRN

## 2020-09-27 MED ORDER — ATORVASTATIN CALCIUM 20 MG PO TABS
80.0000 mg | ORAL_TABLET | Freq: Every day | ORAL | Status: DC
Start: 2020-09-27 — End: 2020-10-05
  Administered 2020-09-28 – 2020-10-04 (×8): 80 mg via ORAL
  Filled 2020-09-27 (×9): qty 4

## 2020-09-27 NOTE — ED Provider Notes (Signed)
[facility] Emergency Department Provider Note  ____________________________________________   Event Date/Time   First MD Initiated Contact with Patient 09/27/20 1235     (approximate)  I have reviewed the triage vital signs and the nursing notes.   HISTORY  Chief Complaint Leg Pain, Hip Pain, and Fall   HPI Cassandra Joseph is a 75 y.o. female with a past medical history of CHF and chronic hypoxic respiratory failure on 2 to 3 L at baseline, ESRD on HD,, RA, multiple myeloma, HTN, HDL and TIA as well as chronic deconditioning and minimally ambulatory at baseline with use of a walker typically getting around her home wheelchair who presents for assessment of some pain in her left hip, left knee and left ankle she states began after she fell onto her left hip on 2/11 while transferring between the chair and bed.  He states she has had some soreness in her left hip since then but has not been able to put as much weight on the leg as she typically does.  She also thinks her leg is slightly more swollen than the right leg.  She denies any other acute pain or injuries from this fall or subsequent falls.  No headache, earache, sore throat chest pain, cough, shortness of breath, nausea, vomiting, diarrhea, dysuria, back pain, right lower extremity pain upper extremity pain.       Past Medical History:  Diagnosis Date  . 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 CHF (congestive heart failure) ([place])   . ESRD (end stage renal disease) ([place])    a. on HD x 11 years; b. felt to be 2/2 HTN; c. HD TTS  . History of nuclear stress test    a. 08/2015: probably normal myocardial perfusion study, no evidence for significant ischemia or scar was noted, during stress, global systolic function normal, EF 54%, RV borderline dilatation, coronary artery calcifications and marked mitral annular calcifications were noted, liver calcifications were noted on attenuation CT scan likely  represent sequelae of previous granulomatous disease  . HLD (hyperlipidemia)   . Hypertension   . Ischemic stroke ([place])    a. 03/2014  . Metabolic syndrome   . Morbid obesity ([place])   . Multiple myeloma (H[facility])    a. in remission  . Osteomyelitis ([place])    a. 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 Rheumatoid arthritis ([place])   . TIA (transient ischemic attack)    a. 2014    Patient Active Problem List   Diagnosis Date Noted  . Viral pneumonia 08/08/2020  . Pneumonia due to COVID-19 virus 08/07/2020  . History of anemia due to chronic kidney disease   . ESRD (end stage renal disease) ([place])   . SOB (shortness of breath)   . Acute pulmonary edema ([place]) 06/23/2019  . Acute on chronic diastolic CHF (congestive heart failure) ([place]) 01/18/2017  . HLD (hyperlipidemia) 01/18/2017  . Atypical chest pain 11/09/2016  . ESRD on hemodialysis ([place]) 11/09/2016  . Morbid obesity ([place]) 11/09/2016  . Essential hypertension 11/09/2016  . Multiple myeloma ([place]) 11/09/2016  . Elevated troponin 11/09/2016  . Metabolic syndrome 17/91/5056    Past Surgical History:  Procedure Laterality Date  . ABDOMINAL HYSTERECTOMY    . APPENDECTOMY    . eyelid surgery    . HERNIA REPAIR    . right knee surgery      Prior to Admission medications   Medication Sig Start Date End Date Taking? Authorizing  Provider  apixaban (ELIQUIS) 2.5 MG TABS tablet Take 1 tablet (2.5 mg total) by mouth 2 (two) times daily. 06/28/19   Lavina Hamman, MD  aspirin EC 81 MG tablet Take 81 mg by mouth daily.    [provider]  atorvastatin (LIPITOR) 80 MG tablet Take 1 tablet (80 mg total) by mouth daily. 06/29/19   Lavina Hamman, MD  b complex-vitamin c-folic acid (NEPHRO-VITE) 0.8 MG TABS tablet Take 1 tablet by mouth daily. 09/27/16   [provider]  carvedilol (COREG) 25 MG tablet Take 25 mg by mouth 2 (two) times daily. 06/26/13   [provider]  cinacalcet (SENSIPAR) 60 MG tablet Take 60 mg by  mouth 2 (two) times daily.    [provider]  gabapentin (NEURONTIN) 300 MG capsule Take 300 mg by mouth 3 (three) times daily. 07/27/20   [provider]  midodrine (PROAMATINE) 10 MG tablet Take 1 tablet (10 mg total) by mouth 3 (three) times daily with meals. 06/28/19   Lavina Hamman, MD  sevelamer carbonate (RENVELA) 800 MG tablet Take 800 mg by mouth 4 (four) times daily.    [provider]  torsemide (DEMADEX) 20 MG tablet Take 3 tablets (60 mg total) by mouth daily. 06/29/19   Lavina Hamman, MD    Allergies Shellfish allergy  Family History  Problem Relation Age of Onset  . Seizures Mother   . Hypertension Mother   . Throat cancer Father   . Breast cancer Neg Hx     Social History Social History   Tobacco Use  . Smoking status: Former Research scientist (life sciences)  . Smokeless tobacco: Never Used  . Tobacco comment: quit in 2007  Substance Use Topics  . Alcohol use: No  . Drug use: No    Review of Systems  Review of Systems  Constitutional: Negative for chills and fever.  HENT: Negative for sore throat.   Eyes: Negative for pain.  Respiratory: Negative for cough and stridor.   Cardiovascular: Negative for chest pain.  Gastrointestinal: Negative for vomiting.  Genitourinary: Negative for dysuria.  Musculoskeletal: Positive for joint pain ( L hip, L knee, L ankle) and myalgias ( "my entire left leg").  Skin: Negative for rash.  Neurological: Negative for seizures, loss of consciousness and headaches.  Psychiatric/Behavioral: Negative for suicidal ideas.  All other systems reviewed and are negative.     ____________________________________________   PHYSICAL EXAM:  VITAL SIGNS: ED Triage Vitals  Enc Vitals Group     BP 09/27/20 1227 (!) 164/67     Pulse Rate 09/27/20 1227 74     Resp 09/27/20 1227 16     Temp 09/27/20 1227 97.8 F (36.6 C)     Temp Source 09/27/20 1227 Oral     SpO2 09/27/20 1227 100 %     Weight 09/27/20 1228 260 lb (117.9  kg)     Height 09/27/20 1228 5' 6"  (1.676 m)     Head Circumference --      Peak Flow --      Pain Score 09/27/20 1227 9     Pain Loc --      Pain Edu? --      Excl. in [place]? --    Vitals:   09/27/20 1227 09/27/20 1500  BP: (!) 164/67 (!) 170/81  Pulse: 74 70  Resp: 16 17  Temp: 97.8 F (36.6 C)   SpO2: 100% 100%   Physical Exam Vitals and nursing note reviewed.  Constitutional:  General: She is not in acute distress.    Appearance: She is well-developed and well-nourished. She is obese.  HENT:     Head: Normocephalic and atraumatic.     Right Ear: External ear normal.     Left Ear: External ear normal.     Nose: Nose normal.  Eyes:     Conjunctiva/sclera: Conjunctivae normal.  Cardiovascular:     Rate and Rhythm: Normal rate and regular rhythm.     Heart sounds: No murmur heard.   Pulmonary:     Effort: Pulmonary effort is normal. No respiratory distress.     Breath sounds: Normal breath sounds.  Abdominal:     Palpations: Abdomen is soft.     Tenderness: There is no abdominal tenderness.  Musculoskeletal:        General: No edema.     Cervical back: Neck supple.  Skin:    General: Skin is warm and dry.     Capillary Refill: Capillary refill takes less than 2 seconds.  Neurological:     Mental Status: She is alert and oriented to person, place, and time.  Psychiatric:        Mood and Affect: Mood and affect and mood normal.     2+ bilateral radial and DP pulses.  No tenderness step-offs or deformities over the C/T/L-spine.  There is no obvious effusion or deformity throughout the left lower extremity but patient does seem slightly weaker left hip left knee and left ankle compared to right lower extremity.  Sensation is intact throughout the bilateral lower extremities.  No other evidence of trauma to left leg or lower back. ____________________________________________   LABS (all labs ordered are listed, but only abnormal results are displayed)  Labs  Reviewed  CBC WITH DIFFERENTIAL/PLATELET  SEDIMENTATION RATE  C-REACTIVE PROTEIN  BASIC METABOLIC PANEL   ____________________________________________  EKG  ____________________________________________  RADIOLOGY  ED MD interpretation: Plain films of the patient's left hip, left knee and left ankle do not show any fracture dislocation.  CT head shows no evidence of intracranial subacute SAH there is a small lytic lesion in left frontal bone.  CT left hip does not show evidence of fracture dislocation but does show a small effusion which certainly could be related to recent trauma.    Official radiology report(s): CT Head Wo Contrast  Result Date: 09/27/2020 CLINICAL DATA:  Head trauma.  Pain. EXAM: CT HEAD WITHOUT CONTRAST TECHNIQUE: Contiguous axial images were obtained from the base of the skull through the vertex without intravenous contrast. COMPARISON:  March 26, 2014 FINDINGS: Brain: No subdural, epidural, or subarachnoid hemorrhage identified. Ventricles and sulci are stable. Mild white matter changes have increased since 2015. No acute cortical ischemia or infarct. Cerebellum, brainstem, and basal cisterns are normal. No mass effect or midline shift. Vascular: No hyperdense vessel or unexpected calcification. Skull: There is a lytic lesion associated with the left frontal bone on series 2, image 45, new since 2015. No other bony lesions are identified. Sinuses/Orbits: Paranasal sinuses are normal. There is opacification of scattered mastoid air cells without bony erosion. The middle ears are well aerated. Other: None. IMPRESSION: 1. No acute intracranial abnormality. 2. Lytic lesion in the left frontal bone as above. This is new since 41 but otherwise age indeterminate. This lytic lesion could be due to the patient's history of multiple myeloma. Electronically Signed   By: Dorise Bullion III M.D   On: 09/27/2020 15:11   CT Hip Left Wo Contrast  Result Date:  09/27/2020 CLINICAL  DATA:  Left hip pain since a fall 09/18/2020. The patient is unable to bear weight. EXAM: CT OF THE LEFT HIP WITHOUT CONTRAST TECHNIQUE: Multidetector CT imaging of the left hip was performed according to the standard protocol. Multiplanar CT image reconstructions were also generated. COMPARISON:  Plain films left hip earlier today. CT abdomen and pelvis 07/13/2005 FINDINGS: Bones/Joint/Cartilage No fracture, dislocation or worrisome lesion is identified. There is some degenerative disease about the right hip with a subchondral cyst seen in the acetabulum. Small to moderate left hip joint effusion is present. Erosive change is seen about both SI joints. Ligaments Suboptimally assessed by CT. Muscles and Tendons Appear intact. Soft tissues Fat containing left inguinal hernia is seen. Sigmoid diverticulosis noted. IMPRESSION: Negative for fracture. Small to moderate left hip joint effusion is nonspecific and could be due to degenerative disease or synovitis. Erosive change about the SI joints new since 2006. Differential considerations include spondyloarthropathy or metabolic process such as secondary hyperparathyroidism. Electronically Signed   By: Inge Rise M.D.   On: 09/27/2020 15:13   US Venous Img Lower Unilateral Left  Result Date: 09/27/2020 CLINICAL DATA:  Left leg swelling. EXAM: LEFT LOWER EXTREMITY VENOUS DOPPLER ULTRASOUND TECHNIQUE: Gray-scale sonography with compression, as well as color and duplex ultrasound, were performed to evaluate the deep venous system(s) from the level of the common femoral vein through the popliteal and proximal calf veins. COMPARISON:  None. FINDINGS: VENOUS Normal compressibility of the common femoral, superficial femoral, and popliteal veins, as well as the visualized calf veins. Visualized portions of profunda femoral vein and great saphenous vein unremarkable. No filling defects to suggest DVT on grayscale or color Doppler imaging. Doppler waveforms show normal  direction of venous flow, normal respiratory plasticity and response to augmentation. Limited views of the contralateral common femoral vein are unremarkable. OTHER None. Limitations: None. IMPRESSION: Negative exam. Electronically Signed   By: Inge Rise M.D.   On: 09/27/2020 13:35   DG Knee Complete 4 Views Left  Result Date: 09/27/2020 CLINICAL DATA:  Worsening left knee pain since a fall 09/18/2020. Initial encounter. EXAM: LEFT KNEE - COMPLETE 4+ VIEW COMPARISON:  None. FINDINGS: No acute bony or joint abnormality is identified. Osteoarthritis about the knee is worst in the medial compartment. Small joint effusion noted. IMPRESSION: No acute abnormality. 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 Electronically Signed   By: Inge Rise M.D.   On: 09/27/2020 14:00   DG Foot Complete Left  Result Date: 09/27/2020 CLINICAL DATA:  Worsening left foot pain since a fall 09/18/2020. Initial encounter. EXAM: LEFT FOOT - COMPLETE 3+ VIEW COMPARISON:  None. FINDINGS: No acute bony or joint abnormality. The patient is status post amputation of the fifth toe. Mild midfoot osteoarthritis is seen. Extensive vascular calcifications. IMPRESSION: No acute finding. Electronically Signed   By: Inge Rise M.D.   On: 09/27/2020 14:02   DG Hip Unilat W or Wo Pelvis 2-3 Views Left  Addendum Date: 09/27/2020   ADDENDUM REPORT: 09/27/2020 15:09 ADDENDUM: This addendum is given for the purpose of noting the patient has erosions about both SI joints. Electronically Signed   By: Inge Rise M.D.   On: 09/27/2020 15:09   Result Date: 09/27/2020 CLINICAL DATA:  Left hip pain since a fall 09/18/2020. Initial encounter. EXAM: DG HIP (WITH OR WITHOUT PELVIS) 2-3V LEFT COMPARISON:  None. FINDINGS: There is no evidence of hip fracture or dislocation. There is no evidence of arthropathy or other focal  bone abnormality. IMPRESSION: Negative exam. Electronically Signed: By: Inge Rise  M.D. On: 09/27/2020 14:03    ____________________________________________   PROCEDURES  Procedure(s) performed (including Critical Care):  .1-3 Lead EKG Interpretation Performed by: Lucrezia Starch, MD Authorized by: Lucrezia Starch, MD     Interpretation: normal     ECG rate assessment: normal     Rhythm: sinus rhythm     Ectopy: none     Conduction: normal       ____________________________________________   INITIAL IMPRESSION / ASSESSMENT AND PLAN / ED COURSE      Patient presents with above to history exam for assessment of some pain in her left lower extremity after a fall approximately 9 days ago.  On arrival she is hypertensive otherwise stable vital signs on room air.  She is quite obese and seems a little bit weaker on the left than the left hip knee and ankle compared to the right lower extremity but there is no obvious deformity joint effusion or other significant skin trauma on exam.  No historical or exam findings to suggest acute infectious process.  Ultrasound as noted above shows no evidence of DVT.  Plain films showed no evidence of left lower extremity fracture or dislocation.  However given on trial of ambulation with walker which patient states she was able to be prior to fall and she is still able to bear weight no significant pain.  CT obtained to assess for possible occult fracture as well as head CT given patient's age and the fact that she is anticoagulated to assess for possible occult SAH.  Both of these are negative for acute injury and left hip or SAH.  However there is a very small to moderate effusion of the left hip.  Certainly could be posttraumatic although we will plan to obtain CBC ESR and CRP to assess for evidence of possible infectious or inflammatory etiology.  Care patient signed over to oncoming provider Dr. Joni Fears at approximately 1530.  Plan is to follow-up these labs and if unremarkable patient is likely safe for discharge with plan for  outpatient PCP follow-up and likely will require some rehab with concern for possible contusion.  If they are elevated she may require an MRI of her left hip to further assess this effusion. ____________________________________________   FINAL CLINICAL IMPRESSION(S) / ED DIAGNOSES  Final diagnoses:  Left leg pain    Medications  acetaminophen (TYLENOL) tablet 1,000 mg (1,000 mg Oral Given 09/27/20 1540)     ED Discharge Orders    None       Note:  This document was prepared using Dragon voice recognition software and may include unintentional dictation errors.   Lucrezia Starch, MD 09/27/20 [phone]

## 2020-09-27 NOTE — ED Triage Notes (Signed)
Patient arrived by EMS from home for fall 09/18/2020. C/o left leg, knee, foot/ankle and hip pain. Baseline uses wheelchair to get around and can transport with a step or 2 but recently has not been able to put weight on left leg

## 2020-09-27 NOTE — ED Notes (Signed)
This RN and 2 others attempted to ambulate pt in the room with walker at bedside. Pt was only able to stand for a minimal period but states "I cant walk on this L foot", MD at bedside and aware as well.

## 2020-09-27 NOTE — ED Notes (Signed)
Pt at XRAY

## 2020-09-27 NOTE — ED Notes (Signed)
US at bedside

## 2020-09-27 NOTE — ED Provider Notes (Signed)
Procedures     ----------------------------------------- 8:06 PM on 09/27/2020 -----------------------------------------  CT shows moderate joint effusion in the left hip. ESR elevated at 60. CRP elevated at 9. Discussed with orthopedics Dr. Mack Guise who recommends hospitalization for IR hip aspiration tomorrow to rule out septic arthritis. Patient does have some pain with passive range of motion of the left hip, but not in severe pain, vital signs remain normal, nontoxic appearing.    Carrie Mew, MD 09/27/20 2007

## 2020-09-27 NOTE — H&P (Signed)
History and Physical   Cassandra Joseph LKT:[id number] DOB: Jun 24, 1946 DOA: 09/27/2020  PCP: Tracie Harrier, MD  Outpatient Specialists: Dr. Dorthula Matas Patient coming from: home  I have personally briefly reviewed patient's old medical records in [place].  Chief Concern: hip pain, not able to bear weight  HPI: STEPANIE Joseph is a 75 y.o. female with medical history significant for end-stage renal disease on hemodialysis Monday Wednesday Friday, hypertension, gout, morbid obesity, multiple myeloma, rheumatoid arthritis seropositive, sleep apnea, hyperlipidemia, presents to the emergency department for chief concerns of left hip pain in unable to bear weight.  She reports that she had a fall on 09/18/2020.  She tried to stay home and they helps the pain resolving and her feeling better.  She reports that she was about to get in bed she lifted her left leg did include the bed her hip bump into the bed and she fell on her side.  She denies head trauma and loss of consciousness.  She describes the pain as sharp, 8/10 when she attempts to ambulate, and 5/10 at rest. She endorses subjective fever in the last few days, however she did not check her temperature. She denies changes to baseline shortness of breath.  She denies chest pain.  At baseline, patient states that she ambulates with a walker.  She reports her last BM was on 09/26/20 in the AM and it was hard, which is normal for her.   Social history: normally lives by herself, however daughter came to live with her since 09/18/20. She denies tobacco use, etoh use, and recreational drug use. 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  Vaccination: she has received two doses of Moderna COVID vaccine, 09/25/19, 10/23/19  ROS: Constitutional: no weight change, + fever ENT/Mouth: no sore throat, no rhinorrhea Eyes: no eye pain, no vision changes Cardiovascular: no chest pain, + dyspnea,  no edema, no  palpitations Respiratory: no cough, no sputum, no wheezing Gastrointestinal: no nausea, no vomiting, no diarrhea, no constipation Genitourinary: patient is anuric Musculoskeletal: + arthralgias, no myalgias Skin: no skin lesions, no pruritus, Neuro: + weakness, no loss of consciousness, no syncope Psych: no anxiety, no depression, + decrease appetite Heme/Lymph: no bruising, no bleeding  ED Course: Discussed with ED provider, patient requiring hospitalization for joint pain with risk for septic joint given elevated inflammatory markers and inability to ambulate.   Assessment/Plan  Principal Problem:   Septic arthritis ([place]) Active Problems:   Morbid obesity ([place])   Essential hypertension   Multiple myeloma (H[facility])   HLD (hyperlipidemia)   Rheumatoid arthritis with positive rheumatoid factor ([facility])   # Left hip joint pain - suspect septic arthritis given patient has risk factors including trauma and RA  - CT imaging showed small to moderate left hip joint effusion  - EDP spoke with ortho, Dr. Mack Guise, who recommended joint aspiration to rule out septic arthritis - Joint aspiration of the left hip order placed - Body fluid cultures with gram stain ordered - Pain control: morphine 1 mg IV q2h prn for severe pain with 3 doses ordered, norco 5 q6h prn for moderated pain, acetaminophen 650 mg q6h prn for pain - PT, OT, TOC ordered   # ESRD on HD, last HD session was on Friday, 09/25/20 and she completed this session - Access is the right upper extremity AV fistula - She denies missing a session - AM team to consult nephrology for resumption of HD session  # Hyperlipidemia -  atorvastatin 80 mg qhs  # Mutiple myeloma - in remission and she does not follow with her oncologist/hematologist anymore - I advised and discussed with patient regarding the new lytic lesion on the left frontal bone and I advised that patient should follow-up with oncologist once she is discharged from the  hospital - Patient endorses understanding and agreement  # Rheumatoid arthritis with positive RF - Elevated crp may be secondary to RA, however septic joint can not be excluded - Outpatient follow-up with rheumatologist  # PAF - she is on eliquis 2.5 mg BID, I did no resume pending joint aspiration - AM team to resume after joint aspiration  # Neuropathy - resumed gabapentin 300 mg TID  # Sleep apnea - likely OSA, CPAP qhs ordered  Note, patient has midodrine 10 mg TID with meals as a home medication- I did not resume as patient is hypertensive and also on antihypertensives (carvedilol 25 mg BID and irbesartan 300 mg daily). I did not see notes for midodrine to given during HD  Chart reviewed.   Echo on 08/11/2020 read as ejection fraction of 60 to 65%, left ventricle has normal function, left ventricle has no regional motion abnormalities, left ventricular diastolic parameters were normal.  RV systolic function is normal.  RV size is normal.  Left atrial size is moderately dilated.  Right atrial size is mildly dilated.  DVT prophylaxis: heparin Code Status: full code Diet: cardiac/renal Family Communication: no Disposition Plan: pending joint aspiration Consults called: EDP spoke to ortho, Dr. Mack Guise who rec joint aspiration Admission status: observation to med surg with telemetry  Past Medical History:  Diagnosis Date  . 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 CHF (congestive heart failure) ([place])   . ESRD (end stage renal disease) ([place])    a. on HD x 11 years; b. felt to be 2/2 HTN; c. HD TTS  . History of nuclear stress test    a. 08/2015: probably normal myocardial perfusion study, no evidence for significant ischemia or scar was noted, during stress, global systolic function normal, EF 54%, RV borderline dilatation, coronary artery calcifications and marked mitral annular calcifications were noted, liver calcifications were noted on attenuation CT scan likely represent  sequelae of previous granulomatous disease  . HLD (hyperlipidemia)   . Hypertension   . Ischemic stroke ([place])    a. 03/2014  . Metabolic syndrome   . Morbid obesity ([place])   . Multiple myeloma ([facility])    a. in remission  . Osteomyelitis ([place])    a. 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 Rheumatoid arthritis ([place])   . TIA (transient ischemic attack)    a. 2014   Past Surgical History:  Procedure Laterality Date  . ABDOMINAL HYSTERECTOMY    . APPENDECTOMY    . eyelid surgery    . HERNIA REPAIR    . right knee surgery     Social History:  reports that she has quit smoking. She has never used smokeless tobacco. She reports that she does not drink alcohol and does not use drugs.  Allergies  Allergen Reactions  . Shellfish Allergy Shortness Of Breath   Family History  Problem Relation Age of Onset  . Seizures Mother   . Hypertension Mother   . Throat cancer Father   . Breast cancer Neg Hx    Family history: Family history reviewed and not pertinent  Prior to Admission medications   Medication Sig Start Date End Date Taking?  Authorizing Provider  apixaban (ELIQUIS) 2.5 MG TABS tablet Take 1 tablet (2.5 mg total) by mouth 2 (two) times daily. 06/28/19   Lavina Hamman, MD  aspirin EC 81 MG tablet Take 81 mg by mouth daily.    [provider]  atorvastatin (LIPITOR) 80 MG tablet Take 1 tablet (80 mg total) by mouth daily. 06/29/19   Lavina Hamman, MD  b complex-vitamin c-folic acid (NEPHRO-VITE) 0.8 MG TABS tablet Take 1 tablet by mouth daily. 09/27/16   [provider]  carvedilol (COREG) 25 MG tablet Take 25 mg by mouth 2 (two) times daily. 06/26/13   [provider]  cinacalcet (SENSIPAR) 60 MG tablet Take 60 mg by mouth 2 (two) times daily.    [provider]  gabapentin (NEURONTIN) 300 MG capsule Take 300 mg by mouth 3 (three) times daily. 07/27/20   [provider]  midodrine (PROAMATINE) 10 MG tablet Take 1 tablet (10 mg total) by  mouth 3 (three) times daily with meals. 06/28/19   Lavina Hamman, MD  sevelamer carbonate (RENVELA) 800 MG tablet Take 800 mg by mouth 4 (four) times daily.    [provider]  torsemide (DEMADEX) 20 MG tablet Take 3 tablets (60 mg total) by mouth daily. 06/29/19   Lavina Hamman, MD   Physical Exam: Vitals:   09/27/20 1228 09/27/20 1500 09/27/20 1600 09/27/20 1630  BP:  (!) 170/81 (!) 191/88 (!) 167/87  Pulse:  70 80   Resp:  17 17   Temp:      TempSrc:      SpO2:  100% 100%   Weight: 117.9 kg     Height: 5' 6"  (1.676 m)      Constitutional: appears age appropriate, NAD, calm, comfortable Eyes: PERRL, lids and conjunctivae normal ENMT: Mucous membranes are moist. Posterior pharynx clear of any exudate or lesions. Age-appropriate dentition. Hearing appropriate Neck: normal, supple, no masses, no thyromegaly Respiratory: clear to auscultation bilaterally, no wheezing, no crackles. Normal respiratory effort. No accessory muscle use.  Cardiovascular: Regular rate and rhythm, no murmurs / rubs / gallops. No extremity edema. 2+ pedal pulses. No carotid bruits.  Abdomen: obese abdomen, no tenderness, no masses palpated, no hepatosplenomegaly. Bowel sounds positive.  Musculoskeletal: no clubbing / cyanosis. 5th left toe is status amputation and appears to be well healed and chronic wounds. Good ROM, no contractures, no atrophy. Normal muscle tone.  Skin: no rashes, lesions, ulcers. No induration Neurologic: Sensation intact. Strength 5/5 in all 4.  Psychiatric: Normal judgment and insight. Alert and oriented x 3. Normal mood.   EKG: not indicated  x-rays on Admission: I personally reviewed and I agree with radiologist reading as below.  CT Head Wo Contrast  Result Date: 09/27/2020 CLINICAL DATA:  Head trauma.  Pain. EXAM: CT HEAD WITHOUT CONTRAST TECHNIQUE: Contiguous axial images were obtained from the base of the skull through the vertex without intravenous contrast.  COMPARISON:  March 26, 2014 FINDINGS: Brain: No subdural, epidural, or subarachnoid hemorrhage identified. Ventricles and sulci are stable. Mild white matter changes have increased since 2015. No acute cortical ischemia or infarct. Cerebellum, brainstem, and basal cisterns are normal. No mass effect or midline shift. Vascular: No hyperdense vessel or unexpected calcification. Skull: There is a lytic lesion associated with the left frontal bone on series 2, image 45, new since 2015. No other bony lesions are identified. Sinuses/Orbits: Paranasal sinuses are normal. There is opacification of scattered mastoid air cells without bony erosion. The  middle ears are well aerated. Other: None. IMPRESSION: 1. No acute intracranial abnormality. 2. Lytic lesion in the left frontal bone as above. This is new since 60 but otherwise age indeterminate. This lytic lesion could be due to the patient's history of multiple myeloma. Electronically Signed   By: Dorise Bullion III M.D   On: 09/27/2020 15:11   CT Hip Left Wo Contrast  Result Date: 09/27/2020 CLINICAL DATA:  Left hip pain since a fall 09/18/2020. The patient is unable to bear weight. EXAM: CT OF THE LEFT HIP WITHOUT CONTRAST TECHNIQUE: Multidetector CT imaging of the left hip was performed according to the standard protocol. Multiplanar CT image reconstructions were also generated. COMPARISON:  Plain films left hip earlier today. CT abdomen and pelvis 07/13/2005 FINDINGS: Bones/Joint/Cartilage No fracture, dislocation or worrisome lesion is identified. There is some degenerative disease about the right hip with a subchondral cyst seen in the acetabulum. Small to moderate left hip joint effusion is present. Erosive change is seen about both SI joints. Ligaments Suboptimally assessed by CT. Muscles and Tendons Appear intact. Soft tissues Fat containing left inguinal hernia is seen. Sigmoid diverticulosis noted. IMPRESSION: Negative for fracture. Small to moderate left  hip joint effusion is nonspecific and could be due to degenerative disease or synovitis. Erosive change about the SI joints new since 2006. Differential considerations include spondyloarthropathy or metabolic process such as secondary hyperparathyroidism. Electronically Signed   By: Inge Rise M.D.   On: 09/27/2020 15:13   US Venous Img Lower Unilateral Left  Result Date: 09/27/2020 CLINICAL DATA:  Left leg swelling. EXAM: LEFT LOWER EXTREMITY VENOUS DOPPLER ULTRASOUND TECHNIQUE: Gray-scale sonography with compression, as well as color and duplex ultrasound, were performed to evaluate the deep venous system(s) from the level of the common femoral vein through the popliteal and proximal calf veins. COMPARISON:  None. FINDINGS: VENOUS Normal compressibility of the common femoral, superficial femoral, and popliteal veins, as well as the visualized calf veins. Visualized portions of profunda femoral vein and great saphenous vein unremarkable. No filling defects to suggest DVT on grayscale or color Doppler imaging. Doppler waveforms show normal direction of venous flow, normal respiratory plasticity and response to augmentation. Limited views of the contralateral common femoral vein are unremarkable. OTHER None. Limitations: None. IMPRESSION: Negative exam. Electronically Signed   By: Inge Rise M.D.   On: 09/27/2020 13:35   DG Knee Complete 4 Views Left  Result Date: 09/27/2020 CLINICAL DATA:  Worsening left knee pain since a fall 09/18/2020. Initial encounter. EXAM: LEFT KNEE - COMPLETE 4+ VIEW COMPARISON:  None. FINDINGS: No acute bony or joint abnormality is identified. Osteoarthritis about the knee is worst in the medial compartment. Small joint effusion noted. IMPRESSION: No acute abnormality. 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 Electronically Signed   By: Inge Rise M.D.   On: 09/27/2020 14:00   DG Foot Complete Left  Result Date: 09/27/2020 CLINICAL DATA:   Worsening left foot pain since a fall 09/18/2020. Initial encounter. EXAM: LEFT FOOT - COMPLETE 3+ VIEW COMPARISON:  None. FINDINGS: No acute bony or joint abnormality. The patient is status post amputation of the fifth toe. Mild midfoot osteoarthritis is seen. Extensive vascular calcifications. IMPRESSION: No acute finding. Electronically Signed   By: Inge Rise M.D.   On: 09/27/2020 14:02   DG Hip Unilat W or Wo Pelvis 2-3 Views Left  Addendum Date: 09/27/2020   ADDENDUM REPORT: 09/27/2020 15:09 ADDENDUM: This addendum is given for the purpose of noting  the patient has erosions about both SI joints. Electronically Signed   By: Inge Rise M.D.   On: 09/27/2020 15:09   Result Date: 09/27/2020 CLINICAL DATA:  Left hip pain since a fall 09/18/2020. Initial encounter. EXAM: DG HIP (WITH OR WITHOUT PELVIS) 2-3V LEFT COMPARISON:  None. FINDINGS: There is no evidence of hip fracture or dislocation. There is no evidence of arthropathy or other focal bone abnormality. IMPRESSION: Negative exam. Electronically Signed: By: Inge Rise M.D. On: 09/27/2020 14:03   Labs on Admission: I have personally reviewed following labs  CBC: Recent Labs  Lab 09/27/20 1539  WBC 7.5  NEUTROABS 5.0  HGB 11.1*  HCT 36.7  MCV 101.7*  PLT 334   Basic Metabolic Panel: Recent Labs  Lab 09/27/20 1539  NA 135  K 4.6  CL 94*  CO2 26  GLUCOSE 79  BUN 45*  CREATININE 7.22*  CALCIUM 12.0*   GFR: Estimated Creatinine Clearance: 9.1 mL/min (A) (by C-G formula based on SCr of 7.22 mg/dL (H)).  Amreen Raczkowski N Janal Haak D.O. [facility]  If 7PM-7AM, please contact overnight-coverage provider If 7AM-7PM, please contact day coverage provider [email or web address]  09/27/2020, 8:41 PM

## 2020-09-27 NOTE — ED Notes (Signed)
This RN agrees with triage assessment.  To add: No numbness or tingling noted. L left pedal pulse strong at this time.

## 2020-09-28 ENCOUNTER — Inpatient Hospital Stay: Payer: Medicare Other

## 2020-09-28 DIAGNOSIS — Z89422 Acquired absence of other left toe(s): Secondary | ICD-10-CM | POA: Diagnosis not present

## 2020-09-28 DIAGNOSIS — M059 Rheumatoid arthritis with rheumatoid factor, unspecified: Secondary | ICD-10-CM | POA: Diagnosis present

## 2020-09-28 DIAGNOSIS — I480 Paroxysmal atrial fibrillation: Secondary | ICD-10-CM | POA: Diagnosis present

## 2020-09-28 DIAGNOSIS — Z20822 Contact with and (suspected) exposure to covid-19: Secondary | ICD-10-CM | POA: Diagnosis present

## 2020-09-28 DIAGNOSIS — Z7901 Long term (current) use of anticoagulants: Secondary | ICD-10-CM | POA: Diagnosis not present

## 2020-09-28 DIAGNOSIS — I120 Hypertensive chronic kidney disease with stage 5 chronic kidney disease or end stage renal disease: Secondary | ICD-10-CM | POA: Diagnosis present

## 2020-09-28 DIAGNOSIS — G629 Polyneuropathy, unspecified: Secondary | ICD-10-CM | POA: Diagnosis present

## 2020-09-28 DIAGNOSIS — Z6841 Body Mass Index (BMI) 40.0 and over, adult: Secondary | ICD-10-CM | POA: Diagnosis not present

## 2020-09-28 DIAGNOSIS — N186 End stage renal disease: Secondary | ICD-10-CM | POA: Diagnosis present

## 2020-09-28 DIAGNOSIS — J9611 Chronic respiratory failure with hypoxia: Secondary | ICD-10-CM | POA: Diagnosis present

## 2020-09-28 DIAGNOSIS — N2581 Secondary hyperparathyroidism of renal origin: Secondary | ICD-10-CM | POA: Diagnosis present

## 2020-09-28 DIAGNOSIS — E041 Nontoxic single thyroid nodule: Secondary | ICD-10-CM | POA: Diagnosis present

## 2020-09-28 DIAGNOSIS — M25452 Effusion, left hip: Secondary | ICD-10-CM | POA: Diagnosis present

## 2020-09-28 DIAGNOSIS — Z90710 Acquired absence of both cervix and uterus: Secondary | ICD-10-CM | POA: Diagnosis not present

## 2020-09-28 DIAGNOSIS — E785 Hyperlipidemia, unspecified: Secondary | ICD-10-CM | POA: Diagnosis present

## 2020-09-28 DIAGNOSIS — W19XXXA Unspecified fall, initial encounter: Secondary | ICD-10-CM | POA: Diagnosis present

## 2020-09-28 DIAGNOSIS — Z992 Dependence on renal dialysis: Secondary | ICD-10-CM | POA: Diagnosis not present

## 2020-09-28 DIAGNOSIS — Z8673 Personal history of transient ischemic attack (TIA), and cerebral infarction without residual deficits: Secondary | ICD-10-CM | POA: Diagnosis not present

## 2020-09-28 DIAGNOSIS — M0579 Rheumatoid arthritis with rheumatoid factor of multiple sites without organ or systems involvement: Secondary | ICD-10-CM | POA: Diagnosis not present

## 2020-09-28 DIAGNOSIS — Z91013 Allergy to seafood: Secondary | ICD-10-CM | POA: Diagnosis not present

## 2020-09-28 DIAGNOSIS — Z90722 Acquired absence of ovaries, bilateral: Secondary | ICD-10-CM | POA: Diagnosis not present

## 2020-09-28 DIAGNOSIS — D631 Anemia in chronic kidney disease: Secondary | ICD-10-CM | POA: Diagnosis present

## 2020-09-28 DIAGNOSIS — M009 Pyogenic arthritis, unspecified: Secondary | ICD-10-CM | POA: Diagnosis present

## 2020-09-28 DIAGNOSIS — C9001 Multiple myeloma in remission: Secondary | ICD-10-CM | POA: Diagnosis present

## 2020-09-28 DIAGNOSIS — G4730 Sleep apnea, unspecified: Secondary | ICD-10-CM | POA: Diagnosis present

## 2020-09-28 DIAGNOSIS — E782 Mixed hyperlipidemia: Secondary | ICD-10-CM | POA: Diagnosis not present

## 2020-09-28 DIAGNOSIS — I10 Essential (primary) hypertension: Secondary | ICD-10-CM | POA: Diagnosis not present

## 2020-09-28 HISTORY — DX: Effusion, left hip: M25.452

## 2020-09-28 LAB — BASIC METABOLIC PANEL
Anion gap: 13 (ref 5–15)
BUN: 51 mg/dL — ABNORMAL HIGH (ref 8–23)
CO2: 27 mmol/L (ref 22–32)
Calcium: 11.4 mg/dL — ABNORMAL HIGH (ref 8.9–10.3)
Chloride: 96 mmol/L — ABNORMAL LOW (ref 98–111)
Creatinine, Ser: 8.15 mg/dL — ABNORMAL HIGH (ref 0.44–1.00)
GFR, Estimated: 5 mL/min — ABNORMAL LOW (ref >60)
Glucose, Bld: 73 mg/dL (ref 70–99)
Potassium: 4.7 mmol/L (ref 3.5–5.1)
Sodium: 136 mmol/L (ref 135–145)

## 2020-09-28 LAB — SYNOVIAL CELL COUNT + DIFF, W/ CRYSTALS
Crystals, Fluid: NONE SEEN
Eosinophils-Synovial: 1 %
Lymphocytes-Synovial Fld: 22 %
Monocyte-Macrophage-Synovial Fluid: 20 %
Neutrophil, Synovial: 56 %
Other Cells-SYN: 1
WBC, Synovial: 180 /mm3 (ref 0–200)

## 2020-09-28 LAB — CBC
HCT: 29.4 % — ABNORMAL LOW (ref 36.0–46.0)
Hemoglobin: 9.6 g/dL — ABNORMAL LOW (ref 12.0–15.0)
MCH: 32 pg (ref 26.0–34.0)
MCHC: 32.7 g/dL (ref 30.0–36.0)
MCV: 98 fL (ref 80.0–100.0)
Platelets: 238 10*3/uL (ref 150–400)
RBC: 3 MIL/uL — ABNORMAL LOW (ref 3.87–5.11)
RDW: 15.7 % — ABNORMAL HIGH (ref 11.5–15.5)
WBC: 5.3 10*3/uL (ref 4.0–10.5)
nRBC: 0 % (ref 0.0–0.2)

## 2020-09-28 MED ORDER — MUSCLE RUB 10-15 % EX CREA
1.0000 "application " | TOPICAL_CREAM | CUTANEOUS | Status: DC | PRN
  Filled 2020-09-28: qty 85

## 2020-09-28 MED ORDER — METOPROLOL TARTRATE 5 MG/5ML IV SOLN: 5.0000 mg | INTRAVENOUS | Status: DC | PRN

## 2020-09-28 MED ORDER — ALUM & MAG HYDROXIDE-SIMETH 200-200-20 MG/5ML PO SUSP: 30.0000 mL | ORAL | Status: DC | PRN

## 2020-09-28 MED ORDER — SALINE SPRAY 0.65 % NA SOLN
2.0000 | NASAL | Status: DC | PRN
  Filled 2020-09-28: qty 44

## 2020-09-28 MED ORDER — EPOETIN ALFA 4000 UNIT/ML IJ SOLN
4000.0000 [IU] | INTRAMUSCULAR | Status: DC
  Administered 2020-09-29 – 2020-10-01 (×2): 4000 [IU] via INTRAVENOUS
  Filled 2020-09-28 (×3): qty 1

## 2020-09-28 MED ORDER — BLISTEX MEDICATED EX OINT
1.0000 "application " | TOPICAL_OINTMENT | CUTANEOUS | Status: DC | PRN
  Filled 2020-09-28: qty 6.3

## 2020-09-28 MED ORDER — HYDROCORTISONE 1 % EX CREA
1.0000 "application " | TOPICAL_CREAM | Freq: Three times a day (TID) | CUTANEOUS | Status: DC | PRN
  Filled 2020-09-28: qty 28

## 2020-09-28 MED ORDER — SENNOSIDES-DOCUSATE SODIUM 8.6-50 MG PO TABS
1.0000 | ORAL_TABLET | Freq: Every evening | ORAL | Status: DC | PRN
  Administered 2020-10-03 – 2020-10-05 (×2): 1 via ORAL
  Filled 2020-09-28 (×2): qty 1

## 2020-09-28 MED ORDER — IPRATROPIUM-ALBUTEROL 0.5-2.5 (3) MG/3ML IN SOLN: 3.0000 mL | RESPIRATORY_TRACT | Status: DC | PRN

## 2020-09-28 MED ORDER — LIDOCAINE HCL (PF) 1 % IJ SOLN
10.0000 mL | Freq: Once | INTRAMUSCULAR | Status: AC
  Administered 2020-09-28: 16:00:00 10 mL

## 2020-09-28 MED ORDER — POLYVINYL ALCOHOL 1.4 % OP SOLN
1.0000 [drp] | OPHTHALMIC | Status: DC | PRN
  Filled 2020-09-28: qty 15

## 2020-09-28 MED ORDER — LORATADINE 10 MG PO TABS: 10.0000 mg | ORAL_TABLET | Freq: Every day | ORAL | Status: DC | PRN

## 2020-09-28 MED ORDER — HYDRALAZINE HCL 20 MG/ML IJ SOLN: 10.0000 mg | INTRAMUSCULAR | Status: DC | PRN

## 2020-09-28 MED ORDER — DM-GUAIFENESIN ER 30-600 MG PO TB12: 1.0000 | ORAL_TABLET | Freq: Two times a day (BID) | ORAL | Status: DC | PRN

## 2020-09-28 MED ORDER — CHLORHEXIDINE GLUCONATE CLOTH 2 % EX PADS
6.0000 | MEDICATED_PAD | Freq: Every day | CUTANEOUS | Status: DC
  Administered 2020-09-29 – 2020-10-05 (×5): 6 via TOPICAL

## 2020-09-28 MED ORDER — HYDROCORTISONE (PERIANAL) 2.5 % EX CREA
1.0000 "application " | TOPICAL_CREAM | Freq: Four times a day (QID) | CUTANEOUS | Status: DC | PRN
  Filled 2020-09-28: qty 28.35

## 2020-09-28 MED ORDER — PHENOL 1.4 % MT LIQD
1.0000 | OROMUCOSAL | Status: DC | PRN
  Filled 2020-09-28: qty 177

## 2020-09-28 NOTE — Progress Notes (Addendum)
Hemodialysis patient known at [place] 11:00am, patient rides with [facility]. Please contact me with any dialysis placement concerns.  [name] [facility] [phone]

## 2020-09-28 NOTE — ED Notes (Signed)
Pt repositioned to right side per request with pillow support. Denies need to be changed at this time.

## 2020-09-28 NOTE — Progress Notes (Signed)
[facility], [place] 09/28/20  Subjective:   LOS: 0  Patient known to our practice from outpatient dialysis.  She dialyzes at [facility] on Monday Wednesday and Friday shift. Last dialysis was on 14th February.  Patient's main reason for admission is pain in the left foot and hip.  No nausea or vomiting.  No fevers or chills  Objective:  Vital signs in last 24 hours:  Temp:  [97.6 F (36.4 C)-98.4 F (36.9 C)] 98.2 F (36.8 C) (02/21 1628) Pulse Rate:  [62-84] 67 (02/21 1628) Resp:  [12-22] 12 (02/21 1628) BP: (125-169)/(54-85) 169/71 (02/21 1628) SpO2:  [91 %-100 %] 97 % (02/21 1628)  Weight change:  Filed Weights   09/27/20 1228  Weight: 117.9 kg    Intake/Output:    Intake/Output Summary (Last 24 hours) at 09/28/2020 1649 Last data filed at 09/28/2020 1410 Gross per 24 hour  Intake 220 ml  Output --  Net 220 ml    Physical Exam: General:  No acute distress, sitting up in chair  HEENT  anicteric, moist oral mucous membrane  Pulm/lungs  normal breathing effort, lungs are clear to auscultation,  O2  CVS/Heart  regular rhythm, no rub or gallop  Abdomen:   Soft, nontender  Extremities:  No peripheral edema  Neurologic:  Alert, oriented, able to follow commands  Skin:  No acute rashes  Right arm AV fistula  Basic Metabolic Panel:  Recent Labs  Lab 09/27/20 1539 09/28/20 0528  NA 135 136  K 4.6 4.7  CL 94* 96*  CO2 26 27  GLUCOSE 79 73  BUN 45* 51*  CREATININE 7.22* 8.15*  CALCIUM 12.0* 11.4*     CBC: Recent Labs  Lab 09/27/20 1539 09/28/20 0528  WBC 7.5 5.3  NEUTROABS 5.0  --   HGB 11.1* 9.6*  HCT 36.7 29.4*  MCV 101.7* 98.0  PLT 260 238      Lab Results  Component Value Date   HEPBSAG Negative 01/19/2017   HEPBSAB Reactive 01/19/2017      Microbiology:  Recent Results (from the past 240 hour(s))  Resp Panel by RT-PCR (Flu A&B, Covid) Nasopharyngeal Swab     Status: None   Collection Time:  09/27/20  7:46 PM   Specimen: Nasopharyngeal Swab; Nasopharyngeal(NP) swabs in vial transport medium  Result Value Ref Range Status   SARS Coronavirus 2 by RT PCR NEGATIVE NEGATIVE Final    Comment: (NOTE) SARS-CoV-2 target nucleic acids are NOT DETECTED.  The SARS-CoV-2 RNA is generally detectable in upper respiratory specimens during the acute phase of infection. The lowest concentration of SARS-CoV-2 viral copies this assay can detect is 138 copies/mL. A negative result does not preclude SARS-Cov-2 infection and should not be used as the sole basis for treatment or other patient management decisions. A negative result may occur with  improper specimen collection/handling, submission of specimen other than nasopharyngeal swab, presence of viral mutation(s) within the areas targeted by this assay, and inadequate number of viral copies(<138 copies/mL). A negative result must be combined with clinical observations, patient history, and epidemiological information. The expected result is Negative.  Fact Sheet for Patients:  EntrepreneurPulse.com.au  Fact Sheet for Healthcare Providers:  IncredibleEmployment.be  This test is no t yet approved or cleared by the [place] FDA and  has been authorized for detection and/or diagnosis of SARS-CoV-2 by FDA under an Emergency Use Authorization (EUA). This EUA will remain  in effect (meaning this test can be used) for the duration  of the COVID-19 declaration under Section 564(b)(1) of the Act, 21 U.S.C.section 360bbb-3(b)(1), unless the authorization is terminated  or revoked sooner.       Influenza A by PCR NEGATIVE NEGATIVE Final   Influenza B by PCR NEGATIVE NEGATIVE Final    Comment: (NOTE) The Xpert Xpress SARS-CoV-2/FLU/RSV plus assay is intended as an aid in the diagnosis of influenza from Nasopharyngeal swab specimens and should not be used as a sole basis for treatment. Nasal washings  and aspirates are unacceptable for Xpert Xpress SARS-CoV-2/FLU/RSV testing.  Fact Sheet for Patients: EntrepreneurPulse.com.au  Fact Sheet for Healthcare Providers: IncredibleEmployment.be  This test is not yet approved or cleared by the [place] FDA and has been authorized for detection and/or diagnosis of SARS-CoV-2 by FDA under an Emergency Use Authorization (EUA). This EUA will remain in effect (meaning this test can be used) for the duration of the COVID-19 declaration under Section 564(b)(1) of the Act, 21 U.S.C. section 360bbb-3(b)(1), unless the authorization is terminated or revoked.  Performed at [facility], [address] [place], [place] [address]     Coagulation Studies: No results for input(s): LABPROT, INR in the last 72 hours.  Urinalysis: No results for input(s): COLORURINE, LABSPEC, PHURINE, GLUCOSEU, HGBUR, BILIRUBINUR, KETONESUR, PROTEINUR, UROBILINOGEN, NITRITE, LEUKOCYTESUR in the last 72 hours.  Invalid input(s): APPERANCEUR    Imaging: CT Head Wo Contrast  Result Date: 09/27/2020 CLINICAL DATA:  Head trauma.  Pain. EXAM: CT HEAD WITHOUT CONTRAST TECHNIQUE: Contiguous axial images were obtained from the base of the skull through the vertex without intravenous contrast. COMPARISON:  March 26, 2014 FINDINGS: Brain: No subdural, epidural, or subarachnoid hemorrhage identified. Ventricles and sulci are stable. Mild white matter changes have increased since 2015. No acute cortical ischemia or infarct. Cerebellum, brainstem, and basal cisterns are normal. No mass effect or midline shift. Vascular: No hyperdense vessel or unexpected calcification. Skull: There is a lytic lesion associated with the left frontal bone on series 2, image 45, new since 2015. No other bony lesions are identified. Sinuses/Orbits: Paranasal sinuses are normal. There is opacification of scattered mastoid air cells without bony erosion.  The middle ears are well aerated. Other: None. IMPRESSION: 1. No acute intracranial abnormality. 2. Lytic lesion in the left frontal bone as above. This is new since 89 but otherwise age indeterminate. This lytic lesion could be due to the patient's history of multiple myeloma. Electronically Signed   By: Dorise Bullion III M.D   On: 09/27/2020 15:11   CT Hip Left Wo Contrast  Result Date: 09/27/2020 CLINICAL DATA:  Left hip pain since a fall 09/18/2020. The patient is unable to bear weight. EXAM: CT OF THE LEFT HIP WITHOUT CONTRAST TECHNIQUE: Multidetector CT imaging of the left hip was performed according to the standard protocol. Multiplanar CT image reconstructions were also generated. COMPARISON:  Plain films left hip earlier today. CT abdomen and pelvis 07/13/2005 FINDINGS: Bones/Joint/Cartilage No fracture, dislocation or worrisome lesion is identified. There is some degenerative disease about the right hip with a subchondral cyst seen in the acetabulum. Small to moderate left hip joint effusion is present. Erosive change is seen about both SI joints. Ligaments Suboptimally assessed by CT. Muscles and Tendons Appear intact. Soft tissues Fat containing left inguinal hernia is seen. Sigmoid diverticulosis noted. IMPRESSION: Negative for fracture. Small to moderate left hip joint effusion is nonspecific and could be due to degenerative disease or synovitis. Erosive change about the SI joints new since 2006. Differential considerations include spondyloarthropathy  or metabolic process such as secondary hyperparathyroidism. Electronically Signed   By: Inge Rise M.D.   On: 09/27/2020 15:13   US Venous Img Lower Unilateral Left  Result Date: 09/27/2020 CLINICAL DATA:  Left leg swelling. EXAM: LEFT LOWER EXTREMITY VENOUS DOPPLER ULTRASOUND TECHNIQUE: Gray-scale sonography with compression, as well as color and duplex ultrasound, were performed to evaluate the deep venous system(s) from the level of  the common femoral vein through the popliteal and proximal calf veins. COMPARISON:  None. FINDINGS: VENOUS Normal compressibility of the common femoral, superficial femoral, and popliteal veins, as well as the visualized calf veins. Visualized portions of profunda femoral vein and great saphenous vein unremarkable. No filling defects to suggest DVT on grayscale or color Doppler imaging. Doppler waveforms show normal direction of venous flow, normal respiratory plasticity and response to augmentation. Limited views of the contralateral common femoral vein are unremarkable. OTHER None. Limitations: None. IMPRESSION: Negative exam. Electronically Signed   By: Inge Rise M.D.   On: 09/27/2020 13:35   DG Knee Complete 4 Views Left  Result Date: 09/27/2020 CLINICAL DATA:  Worsening left knee pain since a fall 09/18/2020. Initial encounter. EXAM: LEFT KNEE - COMPLETE 4+ VIEW COMPARISON:  None. FINDINGS: No acute bony or joint abnormality is identified. Osteoarthritis about the knee is worst in the medial compartment. Small joint effusion noted. IMPRESSION: No acute abnormality. 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 Electronically Signed   By: Inge Rise M.D.   On: 09/27/2020 14:00   DG Foot Complete Left  Result Date: 09/27/2020 CLINICAL DATA:  Worsening left foot pain since a fall 09/18/2020. Initial encounter. EXAM: LEFT FOOT - COMPLETE 3+ VIEW COMPARISON:  None. FINDINGS: No acute bony or joint abnormality. The patient is status post amputation of the fifth toe. Mild midfoot osteoarthritis is seen. Extensive vascular calcifications. IMPRESSION: No acute finding. Electronically Signed   By: Inge Rise M.D.   On: 09/27/2020 14:02   DG FLUORO GUIDED NEEDLE PLC ASPIRATION/INJECTION LOC  Result Date: 09/28/2020 CLINICAL DATA:  Left hip joint effusion EXAM: LEFT HIP ASPIRATION UNDER FLUOROSCOPY COMPARISON:  None. FLUOROSCOPY TIME:  Fluoroscopy Time:  0.1 minute  Radiation Exposure Index (if provided by the fluoroscopic device): 0.6 mGy Number of Acquired Spot Images: 0 PROCEDURE: Overlying skin prepped with Betadine, draped in the usual sterile fashion, and infiltrated locally with buffered Lidocaine. Curved 20 gauge spinal needle advanced to the superolateral margin of the left femoral head-neck junction. Diagnostic injection of iodinated contrast demonstrates intra-articular spread without intravascular component. 5 mL of serosanguineous synovial fluid was aspirated from the left hip joint. Needle was removed. No immediate complications. Patient demonstrated no discomfort. IMPRESSION: Technically successful left hip aspiration under fluoroscopy. Electronically Signed   By: Kathreen Devoid   On: 09/28/2020 16:36   DG Hip Unilat W or Wo Pelvis 2-3 Views Left  Addendum Date: 09/27/2020   ADDENDUM REPORT: 09/27/2020 15:09 ADDENDUM: This addendum is given for the purpose of noting the patient has erosions about both SI joints. Electronically Signed   By: Inge Rise M.D.   On: 09/27/2020 15:09   Result Date: 09/27/2020 CLINICAL DATA:  Left hip pain since a fall 09/18/2020. Initial encounter. EXAM: DG HIP (WITH OR WITHOUT PELVIS) 2-3V LEFT COMPARISON:  None. FINDINGS: There is no evidence of hip fracture or dislocation. There is no evidence of arthropathy or other focal bone abnormality. IMPRESSION: Negative exam. Electronically Signed: By: Inge Rise M.D. On: 09/27/2020 14:03  Medications:    . atorvastatin  80 mg Oral Daily  . carvedilol  25 mg Oral BID WC  . [START ON 09/29/2020] Chlorhexidine Gluconate Cloth  6 each Topical Q0600  . cinacalcet  60 mg Oral BID WC  . gabapentin  300 mg Oral TID  . heparin  5,000 Units Subcutaneous Q8H  . irbesartan  300 mg Oral QHS  . sevelamer carbonate  800 mg Oral QID   acetaminophen **OR** acetaminophen, alum & mag hydroxide-simeth, dextromethorphan-guaiFENesin, hydrALAZINE, HYDROcodone-acetaminophen,  hydrocortisone, hydrocortisone cream, ipratropium-albuterol, lip balm, loratadine, metoprolol tartrate, morphine injection, Muscle Rub, ondansetron **OR** ondansetron (ZOFRAN) IV, phenol, polyvinyl alcohol, senna-docusate, sodium chloride  Assessment/ Plan:  75 y.o. female with end-stage renal disease, hypertension, gout, morbid obesity, multiple myeloma, rheumatoid arthritis, sleep apnea, hyperlipidemia paroxysmal atrial fibrillation, neuropathy multiple myeloma in remission, was admitted on 09/27/2020 for  Principal Problem:   Effusion of left hip Active Problems:   Morbid obesity ([place])   Essential hypertension   Multiple myeloma (HCC)   HLD (hyperlipidemia)   Rheumatoid arthritis with positive rheumatoid factor (HCC)  Septic arthritis (HCC) [M00.9] Left leg pain [M79.605] Effusion of hip joint, left [M25.452] Effusion of left hip [M25.452]  #. ESRD We will arrange for hemodialysis on TTS schedule Orders prepared for tomorrow Outpatient blood pressures are elevated Agree with holding midodrine and evaluating blood pressure during hospital course.  #. Anemia of CKD  Lab Results  Component Value Date   HGB 9.6 (L) 09/28/2020   Low dose EPO with HD  #. Secondary hyperparathyroidism of renal origin N 25.81      Component Value Date/Time   PTH 345 (H) 08/10/2020 0859   Lab Results  Component Value Date   PHOS 6.1 (H) 08/15/2020   Monitor calcium and phos level during this admission   #.  Left hip and foot pain Hospitalist concern for septic joint given elevated inflammatory markers and inability to ambulate.  Left hand joint aspiration planned.  Empiric antibiotics and pain control as per hospitalist.   LOS: 0 Cassandra Joseph Cassandra Joseph 2/21/20224:49 [place], [place]

## 2020-09-28 NOTE — Care Management Obs Status (Signed)
[place] NOTIFICATION   Patient Details  Name: Cassandra Joseph MRN: [id number] Date of Birth: 06-29-1946   Medicare Observation Status Notification Given:  Yes    Shelbie Hutching, RN 09/28/2020, 11:01 AM

## 2020-09-28 NOTE — Progress Notes (Signed)
OT Cancellation Note  Patient Details Name: Cassandra Joseph MRN: [id number] DOB: Dec 10, 1945   Cancelled Treatment:    Reason Eval/Treat Not Completed: Patient at procedure or test/ unavailable. OT order received and chart reviewed. Per RN, pt had left unit for hip aspiration. OT to follow up when pt is next available.   Darleen Crocker, [place], OTR/L , CBIS ascom [phone]  09/28/20, 3:37 PM  09/28/2020, 3:37 PM

## 2020-09-28 NOTE — ED Notes (Signed)
Pt repositioned by this RN and Editor, commissioning. Provided additional pillow and lip moisturizer as requested.

## 2020-09-28 NOTE — Progress Notes (Signed)
PROGRESS NOTE    Cassandra Joseph  YIF:[id number] DOB: 12-01-1945 DOA: 09/27/2020 PCP: Tracie Harrier, MD   Brief Narrative:  75 year old with past medical history of ESRD on HD MWF, HTN, gout, morbid obesity, multiple myeloma, seropositive RA, sleep apnea, HLD presents with left hip pain unable to bear weight.  Reports of falling on 09/18/2020.  Upon admission CT was negative for any fracture but showed moderate sized left effusion.  Joint aspiration was ordered.   Assessment & Plan:   Principal Problem:   Septic arthritis ([place]) Active Problems:   Morbid obesity ([place])   Essential hypertension   Multiple myeloma (HCC)   HLD (hyperlipidemia)   Rheumatoid arthritis with positive rheumatoid factor (HCC)  Left hip pain secondary to moderate size effusion History of rheumatoid arthritis and fall causing trauma -CT shows left-sided moderate effusion.  No evidence of fracture -Joint aspiration order with fluid cultures with Gram stain -Pain control, bowel regimen -PT/OT  ESRD on HD MWF, right upper extremity AV fistula -We will consult nephrology team for HD  Hyperlipidemia -Atorvastatin 80 mg  History of multiple myeloma -Currently in remission.  No longer follows with oncology but has new lytic lesion in the left frontal bone. -We will need to follow-up outpatient oncology- patient and daughter aware.   Rheumatoid arthritis, seropositive -Outpatient rheumatology follow-up  Paroxysmal A. fib -Currently Eliquis on hold in anticipation for joint aspiration -Coreg 25 mg twice daily  Essential hypertension -On ARB  Peripheral neuropathy -Gabapentin 10 mg 3 times daily  Sleep apnea -CPAP at bedtime   DVT prophylaxis: Subcu heparin Code Status: Full code Family Communication:  Called Cassandra Joseph   Dispo: The patient is from: Home              Anticipated d/c is to: Home              Anticipated d/c date is: 1 day              Patient currently is not medically stable to  d/c.   Difficult to place patient No    Body mass index is 41.97 kg/m.   Subjective: Patient feels okay besides mild left hip discomfort  Review of Systems Otherwise negative except as per HPI, including: General: Denies fever, chills, night sweats or unintended weight loss. Resp: Denies cough, wheezing, shortness of breath. Cardiac: Denies chest pain, palpitations, orthopnea, paroxysmal nocturnal dyspnea. GI: Denies abdominal pain, nausea, vomiting, diarrhea or constipation GU: Denies dysuria, frequency, hesitancy or incontinence MS: Denies muscle aches, joint pain or swelling Neuro: Denies headache, neurologic deficits (focal weakness, numbness, tingling), abnormal gait Psych: Denies anxiety, depression, SI/HI/AVH Skin: Denies new rashes or lesions ID: Denies sick contacts, exotic exposures, travel  Examination:  General exam: Appears calm and comfortable  Respiratory system: Clear to auscultation. Respiratory effort normal. Cardiovascular system: S1 & S2 heard, RRR. No JVD, murmurs, rubs, gallops or clicks. No pedal edema. Gastrointestinal system: Abdomen is nondistended, soft and nontender. No organomegaly or masses felt. Normal bowel sounds heard. Central nervous system: Alert and oriented. No focal neurological deficits. Extremities: Symmetric 5 x 5 power. Skin: No rashes, lesions or ulcers Psychiatry: Judgement and insight appear normal. Mood & affect appropriate.     Objective: Vitals:   09/28/20 0615 09/28/20 0630 09/28/20 0645 09/28/20 0730  BP:    (!) 160/76  Pulse: 67 73 73 67  Resp:    (!) 22  Temp:      TempSrc:      SpO2: 100%  100% 100% 99%  Weight:      Height:       No intake or output data in the 24 hours ending 09/28/20 0839 Filed Weights   09/27/20 1228  Weight: 117.9 kg     Data Reviewed:   CBC: Recent Labs  Lab 09/27/20 1539 09/28/20 0528  WBC 7.5 5.3  NEUTROABS 5.0  --   HGB 11.1* 9.6*  HCT 36.7 29.4*  MCV 101.7* 98.0  PLT  260 970   Basic Metabolic Panel: Recent Labs  Lab 09/27/20 1539 09/28/20 0528  NA 135 136  K 4.6 4.7  CL 94* 96*  CO2 26 27  GLUCOSE 79 73  BUN 45* 51*  CREATININE 7.22* 8.15*  CALCIUM 12.0* 11.4*   GFR: Estimated Creatinine Clearance: 8 mL/min (A) (by C-G formula based on SCr of 8.15 mg/dL (H)). Liver Function Tests: No results for input(s): AST, ALT, ALKPHOS, BILITOT, PROT, ALBUMIN in the last 168 hours. No results for input(s): LIPASE, AMYLASE in the last 168 hours. No results for input(s): AMMONIA in the last 168 hours. Coagulation Profile: No results for input(s): INR, PROTIME in the last 168 hours. Cardiac Enzymes: No results for input(s): CKTOTAL, CKMB, CKMBINDEX, TROPONINI in the last 168 hours. BNP (last 3 results) No results for input(s): PROBNP in the last 8760 hours. HbA1C: No results for input(s): HGBA1C in the last 72 hours. CBG: No results for input(s): GLUCAP in the last 168 hours. Lipid Profile: No results for input(s): CHOL, HDL, LDLCALC, TRIG, CHOLHDL, LDLDIRECT in the last 72 hours. Thyroid Function Tests: No results for input(s): TSH, T4TOTAL, FREET4, T3FREE, THYROIDAB in the last 72 hours. Anemia Panel: No results for input(s): VITAMINB12, FOLATE, FERRITIN, TIBC, IRON, RETICCTPCT in the last 72 hours. Sepsis Labs: No results for input(s): PROCALCITON, LATICACIDVEN in the last 168 hours.  Recent Results (from the past 240 hour(s))  Resp Panel by RT-PCR (Flu A&B, Covid) Nasopharyngeal Swab     Status: None   Collection Time: 09/27/20  7:46 PM   Specimen: Nasopharyngeal Swab; Nasopharyngeal(NP) swabs in vial transport medium  Result Value Ref Range Status   SARS Coronavirus 2 by RT PCR NEGATIVE NEGATIVE Final    Comment: (NOTE) SARS-CoV-2 target nucleic acids are NOT DETECTED.  The SARS-CoV-2 RNA is generally detectable in upper respiratory specimens during the acute phase of infection. The lowest concentration of SARS-CoV-2 viral copies this  assay can detect is 138 copies/mL. A negative result does not preclude SARS-Cov-2 infection and should not be used as the sole basis for treatment or other patient management decisions. A negative result may occur with  improper specimen collection/handling, submission of specimen other than nasopharyngeal swab, presence of viral mutation(s) within the areas targeted by this assay, and inadequate number of viral copies(<138 copies/mL). A negative result must be combined with clinical observations, patient history, and epidemiological information. The expected result is Negative.  Fact Sheet for Patients:  EntrepreneurPulse.com.au  Fact Sheet for Healthcare Providers:  IncredibleEmployment.be  This test is no t yet approved or cleared by the [place] FDA and  has been authorized for detection and/or diagnosis of SARS-CoV-2 by FDA under an Emergency Use Authorization (EUA). This EUA will remain  in effect (meaning this test can be used) for the duration of the COVID-19 declaration under Section 564(b)(1) of the Act, 21 U.S.C.section 360bbb-3(b)(1), unless the authorization is terminated  or revoked sooner.       Influenza A by PCR NEGATIVE NEGATIVE Final   Influenza B  by PCR NEGATIVE NEGATIVE Final    Comment: (NOTE) The Xpert Xpress SARS-CoV-2/FLU/RSV plus assay is intended as an aid in the diagnosis of influenza from Nasopharyngeal swab specimens and should not be used as a sole basis for treatment. Nasal washings and aspirates are unacceptable for Xpert Xpress SARS-CoV-2/FLU/RSV testing.  Fact Sheet for Patients: EntrepreneurPulse.com.au  Fact Sheet for Healthcare Providers: IncredibleEmployment.be  This test is not yet approved or cleared by the [place] FDA and has been authorized for detection and/or diagnosis of SARS-CoV-2 by FDA under an Emergency Use Authorization (EUA). This EUA will  remain in effect (meaning this test can be used) for the duration of the COVID-19 declaration under Section 564(b)(1) of the Act, 21 U.S.C. section 360bbb-3(b)(1), unless the authorization is terminated or revoked.  Performed at [facility], [address] [place], [place] [address]          Radiology Studies: CT Head Wo Contrast  Result Date: 09/27/2020 CLINICAL DATA:  Head trauma.  Pain. EXAM: CT HEAD WITHOUT CONTRAST TECHNIQUE: Contiguous axial images were obtained from the base of the skull through the vertex without intravenous contrast. COMPARISON:  March 26, 2014 FINDINGS: Brain: No subdural, epidural, or subarachnoid hemorrhage identified. Ventricles and sulci are stable. Mild white matter changes have increased since 2015. No acute cortical ischemia or infarct. Cerebellum, brainstem, and basal cisterns are normal. No mass effect or midline shift. Vascular: No hyperdense vessel or unexpected calcification. Skull: There is a lytic lesion associated with the left frontal bone on series 2, image 45, new since 2015. No other bony lesions are identified. Sinuses/Orbits: Paranasal sinuses are normal. There is opacification of scattered mastoid air cells without bony erosion. The middle ears are well aerated. Other: None. IMPRESSION: 1. No acute intracranial abnormality. 2. Lytic lesion in the left frontal bone as above. This is new since 53 but otherwise age indeterminate. This lytic lesion could be due to the patient's history of multiple myeloma. Electronically Signed   By: Dorise Bullion III M.D   On: 09/27/2020 15:11   CT Hip Left Wo Contrast  Result Date: 09/27/2020 CLINICAL DATA:  Left hip pain since a fall 09/18/2020. The patient is unable to bear weight. EXAM: CT OF THE LEFT HIP WITHOUT CONTRAST TECHNIQUE: Multidetector CT imaging of the left hip was performed according to the standard protocol. Multiplanar CT image reconstructions were also generated. COMPARISON:   Plain films left hip earlier today. CT abdomen and pelvis 07/13/2005 FINDINGS: Bones/Joint/Cartilage No fracture, dislocation or worrisome lesion is identified. There is some degenerative disease about the right hip with a subchondral cyst seen in the acetabulum. Small to moderate left hip joint effusion is present. Erosive change is seen about both SI joints. Ligaments Suboptimally assessed by CT. Muscles and Tendons Appear intact. Soft tissues Fat containing left inguinal hernia is seen. Sigmoid diverticulosis noted. IMPRESSION: Negative for fracture. Small to moderate left hip joint effusion is nonspecific and could be due to degenerative disease or synovitis. Erosive change about the SI joints new since 2006. Differential considerations include spondyloarthropathy or metabolic process such as secondary hyperparathyroidism. Electronically Signed   By: Inge Rise M.D.   On: 09/27/2020 15:13   US Venous Img Lower Unilateral Left  Result Date: 09/27/2020 CLINICAL DATA:  Left leg swelling. EXAM: LEFT LOWER EXTREMITY VENOUS DOPPLER ULTRASOUND TECHNIQUE: Gray-scale sonography with compression, as well as color and duplex ultrasound, were performed to evaluate the deep venous system(s) from the level of the common femoral vein through  the popliteal and proximal calf veins. COMPARISON:  None. FINDINGS: VENOUS Normal compressibility of the common femoral, superficial femoral, and popliteal veins, as well as the visualized calf veins. Visualized portions of profunda femoral vein and great saphenous vein unremarkable. No filling defects to suggest DVT on grayscale or color Doppler imaging. Doppler waveforms show normal direction of venous flow, normal respiratory plasticity and response to augmentation. Limited views of the contralateral common femoral vein are unremarkable. OTHER None. Limitations: None. IMPRESSION: Negative exam. Electronically Signed   By: Inge Rise M.D.   On: 09/27/2020 13:35   DG  Knee Complete 4 Views Left  Result Date: 09/27/2020 CLINICAL DATA:  Worsening left knee pain since a fall 09/18/2020. Initial encounter. EXAM: LEFT KNEE - COMPLETE 4+ VIEW COMPARISON:  None. FINDINGS: No acute bony or joint abnormality is identified. Osteoarthritis about the knee is worst in the medial compartment. Small joint effusion noted. IMPRESSION: No acute abnormality. 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 Electronically Signed   By: Inge Rise M.D.   On: 09/27/2020 14:00   DG Foot Complete Left  Result Date: 09/27/2020 CLINICAL DATA:  Worsening left foot pain since a fall 09/18/2020. Initial encounter. EXAM: LEFT FOOT - COMPLETE 3+ VIEW COMPARISON:  None. FINDINGS: No acute bony or joint abnormality. The patient is status post amputation of the fifth toe. Mild midfoot osteoarthritis is seen. Extensive vascular calcifications. IMPRESSION: No acute finding. Electronically Signed   By: Inge Rise M.D.   On: 09/27/2020 14:02   DG Hip Unilat W or Wo Pelvis 2-3 Views Left  Addendum Date: 09/27/2020   ADDENDUM REPORT: 09/27/2020 15:09 ADDENDUM: This addendum is given for the purpose of noting the patient has erosions about both SI joints. Electronically Signed   By: Inge Rise M.D.   On: 09/27/2020 15:09   Result Date: 09/27/2020 CLINICAL DATA:  Left hip pain since a fall 09/18/2020. Initial encounter. EXAM: DG HIP (WITH OR WITHOUT PELVIS) 2-3V LEFT COMPARISON:  None. FINDINGS: There is no evidence of hip fracture or dislocation. There is no evidence of arthropathy or other focal bone abnormality. IMPRESSION: Negative exam. Electronically Signed: By: Inge Rise M.D. On: 09/27/2020 14:03        Scheduled Meds: . atorvastatin  80 mg Oral Daily  . carvedilol  25 mg Oral BID WC  . cinacalcet  60 mg Oral BID WC  . gabapentin  300 mg Oral TID  . heparin  5,000 Units Subcutaneous Q8H  . irbesartan  300 mg Oral QHS  . sevelamer carbonate  800 mg  Oral QID   Continuous Infusions:   LOS: 0 days   Time spent= 35 mins    Aishani Kalis Arsenio Loader, MD [facility]  If 7PM-7AM, please contact night-coverage  09/28/2020, 8:39 AM

## 2020-09-28 NOTE — Evaluation (Signed)
Physical Therapy Evaluation Patient Details Name: Cassandra Joseph MRN: [id number] DOB: Jun 27, 1946 Today's Date: 09/28/2020   History of Present Illness  Per MD notes: Pt is a 75 y.o. female with medical history significant for end-stage renal disease on hemodialysis M,W,F, HTN, gout, morbid obesity, multiple myeloma, rheumatoid arthritis seropositive, sleep apnea, hyperlipidemia, presents to the emergency department for chief concerns of left hip pain in unable to bear weight after a fall.  MD assessment include: Left hip pain secondary to moderate size effusion and RA and fall causing trauma.    Clinical Impression  Pt was pleasant and motivated to participate during the session but ultimately presented with very limited functional mobiltiy.  Pt required significant assistance with bed mobility tasks and to come to standing and was unable to advance either LE even with heavy lean on her PFW.  Pt reported only mild L ankle pain with weight bearing.  Pt would not be safe to return to her prior living situation at this time secondary to being at high risk for falls and rapid functional decline.  Pt will benefit from PT services in a SNF setting upon discharge to safely address deficits listed in patient problem list for decreased caregiver assistance and eventual return to PLOF.      Follow Up Recommendations SNF;Supervision for mobility/OOB    Equipment Recommendations  None recommended by PT    Recommendations for Other Services       Precautions / Restrictions Precautions Precautions: Fall Restrictions Weight Bearing Restrictions: No      Mobility  Bed Mobility Overal bed mobility: Needs Assistance Bed Mobility: Supine to Sit;Sit to Supine     Supine to sit: Mod assist Sit to supine: +2 for physical assistance;Mod assist   General bed mobility comments: Mod A for BLE and trunk control    Transfers Overall transfer level: Needs assistance Equipment used: Right platform  walker Transfers: Sit to/from Stand Sit to Stand: +2 physical assistance;Mod assist         General transfer comment: Mod multi-modal cues for increased trunk flexion  Ambulation/Gait             General Gait Details: Unable to advance either LE despite heavy lean on the RW  Stairs            Wheelchair Mobility    Modified Rankin (Stroke Patients Only)       Balance Overall balance assessment: Needs assistance Sitting-balance support: Bilateral upper extremity supported;Feet unsupported Sitting balance-Leahy Scale: Good                                       Pertinent Vitals/Pain Pain Assessment: No/denies pain    Home Living Family/patient expects to be discharged to:: Private residence Living Arrangements: Children;Other relatives Available Help at Discharge: Family;Available 24 hours/day Type of Home: House Home Access: Ramped entrance     Home Layout: One level Home Equipment: Walker - 2 wheels;Wheelchair - [employer];Adaptive equipment      Prior Function Level of Independence: Needs assistance   Gait / Transfers Assistance Needed: Mod Ind amb limited home distances with a PFW, history of falls, w/c for community access  ADL's / Homemaking Assistance Needed: Family assist with bathing, meal prep, and chores.        Hand Dominance   Dominant Hand: Left    Extremity/Trunk Assessment   Upper Extremity Assessment Upper Extremity Assessment: Generalized  weakness    Lower Extremity Assessment Lower Extremity Assessment: Generalized weakness       Communication   Communication: No difficulties  Cognition Arousal/Alertness: Awake/alert Behavior During Therapy: WFL for tasks assessed/performed Overall Cognitive Status: Within Functional Limits for tasks assessed                                        General Comments      Exercises Total Joint Exercises Ankle Circles/Pumps:  AROM;Strengthening;Both;5 reps;10 reps Quad Sets: Strengthening;Both;5 reps;10 reps Gluteal Sets: Strengthening;Both;5 reps;10 reps Hip ABduction/ADduction: AAROM;Strengthening;Both;5 reps Other Exercises Other Exercises: Anterior weight shifting in sitting to facilitate transfers with decreased assistance Other Exercises: HEP education for BLE APs, QS, and GS x 10 each every 1-2 hours daily   Assessment/Plan    PT Assessment Patient needs continued PT services  PT Problem List Decreased strength;Decreased activity tolerance;Decreased balance;Decreased mobility;Decreased knowledge of use of DME;Pain       PT Treatment Interventions DME instruction;Gait training;Functional mobility training;Therapeutic activities;Therapeutic exercise;Balance training;Patient/family education    PT Goals (Current goals can be found in the Care Plan section)  Acute Rehab PT Goals Patient Stated Goal: To be able to walk again PT Goal Formulation: With patient Time For Goal Achievement: 10/11/20 Potential to Achieve Goals: Fair    Frequency Min 2X/week   Barriers to discharge Inaccessible home environment;Decreased caregiver support      Co-evaluation               AM-PAC PT "6 Clicks" Mobility  Outcome Measure Help needed turning from your back to your side while in a flat bed without using bedrails?: A Lot Help needed moving from lying on your back to sitting on the side of a flat bed without using bedrails?: A Lot Help needed moving to and from a bed to a chair (including a wheelchair)?: Total Help needed standing up from a chair using your arms (e.g., wheelchair or bedside chair)?: A Lot Help needed to walk in hospital room?: Total Help needed climbing 3-5 steps with a railing? : Total 6 Click Score: 9    End of Session Equipment Utilized During Treatment: Gait belt Activity Tolerance: Patient tolerated treatment well Patient left: in bed;with call bell/phone within reach;with bed  alarm set;with family/visitor present Nurse Communication: Mobility status PT Visit Diagnosis: History of falling (Z91.81);Difficulty in walking, not elsewhere classified (R26.2);Pain;Muscle weakness (generalized) (M62.81) Pain - part of body: Ankle and joints of foot    Time: 1335-1416 PT Time Calculation (min) (ACUTE ONLY): 41 min   Charges:   PT Evaluation $PT Eval Moderate Complexity: 1 Mod PT Treatments $Therapeutic Exercise: 8-22 mins $Therapeutic Activity: 8-22 mins        D. Royetta Asal PT, DPT 09/28/20, 3:45 PM

## 2020-09-28 NOTE — TOC Progression Note (Signed)
Transition of Care [facility]) - Progression Note    Patient Details  Name: LUVIA ORZECHOWSKI MRN: [id number] Date of Birth: [birth year]  [facility]) CM/SW Contact  Shelbie Hutching, RN Phone Number: 09/28/2020, 11:27 AM  Clinical Narrative:    Patient is open with [facility] for home health services.    Expected Discharge Plan: [place] Barriers to Discharge: Continued Medical Work up  Expected Discharge Plan and Services Expected Discharge Plan: [place]   Discharge Planning Services: CM Consult Post Acute Care Choice: [place] Living arrangements for the past 2 months: Single Family Home                 DME Arranged: N/A DME Agency: NA                   Social Determinants of Health (SDOH) Interventions    Readmission Risk Interventions Readmission Risk Prevention Plan 08/11/2020 06/25/2019 05/14/2019  Transportation Screening Complete Complete Complete  PCP or Specialist Appt within 3-5 Days Complete Complete Complete  HRI or Home Care Consult Complete Complete Complete  Social Work Consult for [place] Planning/Counseling Complete Complete Complete  Palliative Care Screening Not Applicable Not Applicable Not Applicable  Medication Review Press photographer) Complete Complete Complete  Some recent data might be hidden

## 2020-09-28 NOTE — ED Notes (Signed)
Pt repositioned in bed, call bell within reach Eating breakfast at this time

## 2020-09-28 NOTE — TOC Initial Note (Signed)
Transition of Care [facility]) - Initial/Assessment Note    Patient Details  Name: Cassandra Joseph MRN: [id number] Date of Birth: January 22, 1946  Transition of [facility]) CM/SW Contact:    Cassandra Hutching, RN Phone Number: 09/28/2020, 11:21 AM  Clinical Narrative:                 Patient placed under observation for leg pain and effusion of left hip joint.  Plan for IR to do a hip aspiration tomorrow.  RNCM met with patient at the bedside.  Patient is from home where her daughter, Cassandra Joseph lives with her.  Patient is on chronic O2 at 2L, wears a CPAP at night, has a walker, bedside commode, hospital bed, and shower chair.  Patient reports she was at [facility] for rehab and released a couple of weeks ago back home.  Patient reports she does have home health but not sure of the company right now.  Patient will be willing to return to [facility] for rehab again.  Patient is a dialysis patient MWF at [facility] on [address].    Expected Discharge Plan: Skilled Nursing Facility Barriers to Discharge: Continued Medical Work up   Patient Goals and CMS Choice Patient states their goals for this hospitalization and ongoing recovery are:: would like to go back to rehab CMS Medicare.gov Compare Post Acute Care list provided to:: Patient Choice offered to / list presented to : Patient  Expected Discharge Plan and Services Expected Discharge Plan: [place]   Discharge Planning Services: CM Consult Post Acute Care Choice: [place] Living arrangements for the past 2 months: Single Family Home                 DME Arranged: N/A DME Agency: NA                  Prior Living Arrangements/Services Living arrangements for the past 2 months: Single Family Home Lives with:: Adult Children Patient language and need for interpreter reviewed:: Yes Do you feel safe going back to the place where you live?: Yes      Need for Family Participation in Patient Care: Yes (Comment)  (septic arthritis) Care giver support system in place?: Yes (comment) (daughter) Current home services: DME,Home PT (oxygen, CPAP, walker, bedside commode, hospital bed, shower chair) Criminal Activity/Legal Involvement Pertinent to Current Situation/Hospitalization: No - Comment as needed  Activities of Daily Living      Permission Sought/Granted Permission sought to share information with : Case Manager,Facility Contact Representative,Family Supports Permission granted to share information with : Yes, Verbal Permission Granted  Share Information with NAME: Cassandra Joseph  Permission granted to share info w AGENCY: [facility]  Permission granted to share info w Relationship: daughter     Emotional Assessment Appearance:: Appears stated age Attitude/Demeanor/Rapport: Engaged Affect (typically observed): Accepting Orientation: : Oriented to Self,Oriented to Place,Oriented to  Time,Oriented to Situation Alcohol / Substance Use: Not Applicable Psych Involvement: No (comment)  Admission diagnosis:  Septic arthritis ([place]) [M00.9] Left leg pain [M79.605] Effusion of hip joint, left [M25.452] Patient Active Problem List   Diagnosis Date Noted  . Septic arthritis ([place]) 09/27/2020  . Rheumatoid arthritis with positive rheumatoid factor ([place]) 09/27/2020  . Left leg pain   . Viral pneumonia 08/08/2020  . Pneumonia due to COVID-19 virus 08/07/2020  . History of anemia due to chronic kidney disease   . ESRD (end stage renal disease) ([place])   . SOB (shortness of breath)   . Acute  pulmonary edema ([place]) 06/23/2019  . Acute on chronic diastolic CHF (congestive heart failure) ([place]) 01/18/2017  . HLD (hyperlipidemia) 01/18/2017  . Atypical chest pain 11/09/2016  . ESRD on hemodialysis ([place]) 11/09/2016  . Morbid obesity ([place]) 11/09/2016  . Essential hypertension 11/09/2016  . Multiple myeloma ([place]) 11/09/2016  . Elevated troponin 11/09/2016  . Metabolic syndrome 83/04/4075   PCP:  Cassandra Harrier,  MD Pharmacy:   [id number] [place]Phone: [phone]: [phone]    Social Determinants of Health (SDOH) Interventions    Readmission Risk Interventions Readmission Risk Prevention Plan 08/11/2020 06/25/2019 05/14/2019  Transportation Screening Complete Complete Complete  PCP or Specialist Appt within 3-5 Days Complete Complete Complete  HRI or Home Care Consult Complete Complete Complete  Social Work Consult for RSebewaingPlanning/Counseling Complete Complete Complete  Palliative Care Screening Not Applicable Not Applicable Not Applicable  Medication Review (Press photographer Complete Complete Complete  Some recent data might be hidden

## 2020-09-29 ENCOUNTER — Inpatient Hospital Stay: Payer: Medicare Other

## 2020-09-29 DIAGNOSIS — M25452 Effusion, left hip: Principal | ICD-10-CM

## 2020-09-29 LAB — CBC
HCT: 27.3 % — ABNORMAL LOW (ref 36.0–46.0)
HCT: 28.9 % — ABNORMAL LOW (ref 36.0–46.0)
Hemoglobin: 8.6 g/dL — ABNORMAL LOW (ref 12.0–15.0)
Hemoglobin: 9.3 g/dL — ABNORMAL LOW (ref 12.0–15.0)
MCH: 31 pg (ref 26.0–34.0)
MCH: 31.4 pg (ref 26.0–34.0)
MCHC: 31.5 g/dL (ref 30.0–36.0)
MCHC: 32.2 g/dL (ref 30.0–36.0)
MCV: 98.6 fL (ref 80.0–100.0)
MCV: 97.6 fL (ref 80.0–100.0)
Platelets: 230 10*3/uL (ref 150–400)
Platelets: 251 10*3/uL (ref 150–400)
RBC: 2.77 MIL/uL — ABNORMAL LOW (ref 3.87–5.11)
RBC: 2.96 MIL/uL — ABNORMAL LOW (ref 3.87–5.11)
RDW: 15.6 % — ABNORMAL HIGH (ref 11.5–15.5)
RDW: 15.5 % (ref 11.5–15.5)
WBC: 4.8 10*3/uL (ref 4.0–10.5)
WBC: 5.1 10*3/uL (ref 4.0–10.5)
nRBC: 0 % (ref 0.0–0.2)
nRBC: 0 % (ref 0.0–0.2)

## 2020-09-29 LAB — COMPREHENSIVE METABOLIC PANEL
ALT: 6 U/L (ref 0–44)
AST: 8 U/L — ABNORMAL LOW (ref 15–41)
Albumin: 2.8 g/dL — ABNORMAL LOW (ref 3.5–5.0)
Alkaline Phosphatase: 46 U/L (ref 38–126)
Anion gap: 12 (ref 5–15)
BUN: 63 mg/dL — ABNORMAL HIGH (ref 8–23)
CO2: 27 mmol/L (ref 22–32)
Calcium: 10.7 mg/dL — ABNORMAL HIGH (ref 8.9–10.3)
Chloride: 94 mmol/L — ABNORMAL LOW (ref 98–111)
Creatinine, Ser: 9.3 mg/dL — ABNORMAL HIGH (ref 0.44–1.00)
GFR, Estimated: 4 mL/min — ABNORMAL LOW (ref >60)
Glucose, Bld: 87 mg/dL (ref 70–99)
Potassium: 5 mmol/L (ref 3.5–5.1)
Sodium: 133 mmol/L — ABNORMAL LOW (ref 135–145)
Total Bilirubin: 0.8 mg/dL (ref 0.3–1.2)
Total Protein: 7 g/dL (ref 6.5–8.1)

## 2020-09-29 LAB — MAGNESIUM: Magnesium: 2.3 mg/dL (ref 1.7–2.4)

## 2020-09-29 LAB — GLUCOSE, CAPILLARY
Glucose-Capillary: 88 mg/dL (ref 70–99)
Glucose-Capillary: 102 mg/dL — ABNORMAL HIGH (ref 70–99)
Glucose-Capillary: 77 mg/dL (ref 70–99)

## 2020-09-29 LAB — HEPATITIS B SURFACE ANTIGEN: Hepatitis B Surface Ag: NONREACTIVE

## 2020-09-29 MED ORDER — APIXABAN 2.5 MG PO TABS
2.5000 mg | ORAL_TABLET | Freq: Two times a day (BID) | ORAL | Status: DC
  Administered 2020-09-29 – 2020-10-05 (×12): 2.5 mg via ORAL
  Filled 2020-09-29 (×13): qty 1

## 2020-09-29 MED ORDER — PENTAFLUOROPROP-TETRAFLUOROETH EX AERO
1.0000 "application " | INHALATION_SPRAY | CUTANEOUS | Status: DC | PRN
  Filled 2020-09-29: qty 30

## 2020-09-29 MED ORDER — LIDOCAINE-PRILOCAINE 2.5-2.5 % EX CREA
1.0000 "application " | TOPICAL_CREAM | CUTANEOUS | Status: DC | PRN
  Filled 2020-09-29: qty 5

## 2020-09-29 MED ORDER — SODIUM CHLORIDE 0.9 % IV SOLN: 100.0000 mL | INTRAVENOUS | Status: DC | PRN

## 2020-09-29 MED ORDER — HEPARIN SODIUM (PORCINE) 1000 UNIT/ML DIALYSIS
1000.0000 [IU] | INTRAMUSCULAR | Status: DC | PRN
  Filled 2020-09-29: qty 1

## 2020-09-29 MED ORDER — LIDOCAINE HCL (PF) 1 % IJ SOLN
5.0000 mL | INTRAMUSCULAR | Status: DC | PRN
  Filled 2020-09-29: qty 5

## 2020-09-29 MED ORDER — ALTEPLASE 2 MG IJ SOLR
2.0000 mg | Freq: Once | INTRAMUSCULAR | Status: DC | PRN
  Filled 2020-09-29: qty 2

## 2020-09-29 NOTE — Progress Notes (Addendum)
[place], [place] 09/29/20  Subjective:   LOS: 1  Patient known to our practice from outpatient dialysis.  She dialyzes at [facility] on Monday Wednesday and Friday shift. Last dialysis was on 14th February.  Patient's main reason for admission is pain in the left foot and hip.    Patient seen resting in bed Able to tolerate meals Denies nausea and vomiting Currently on room air Denies shortness of breath    HEMODIALYSIS FLOWSHEET:  Blood Flow Rate (mL/min): 400 mL/min Arterial Pressure (mmHg): -170 mmHg Venous Pressure (mmHg): 170 mmHg Transmembrane Pressure (mmHg): 70 mmHg Ultrafiltration Rate (mL/min): 50 mL/min Dialysate Flow Rate (mL/min): 500 ml/min Conductivity: Machine : 13.8 Conductivity: Machine : 13.8 Dialysis Fluid Bolus: Normal Saline Bolus Amount (mL): 300 mL     Objective:  Vital signs in last 24 hours:  Temp:  [97.3 F (36.3 C)-98.4 F (36.9 C)] 97.7 F (36.5 C) (02/22 0750) Pulse Rate:  [58-82] 68 (02/22 0916) Resp:  [12-20] 18 (02/22 0750) BP: (135-169)/(62-82) 150/71 (02/22 0916) SpO2:  [97 %-98 %] 98 % (02/22 0750)  Weight change:  Filed Weights   09/27/20 1228  Weight: 117.9 kg    Intake/Output:    Intake/Output Summary (Last 24 hours) at 09/29/2020 1157 Last data filed at 09/29/2020 1050 Gross per 24 hour  Intake 220 ml  Output -  Net 220 ml    Physical Exam: General:  No acute distress, laying in bed  HEENT  anicteric, moist oral mucous membrane  Pulm/lungs  normal breathing effort, lungs are clear  CVS/Heart  regular rhythm, no rub or gallop  Abdomen:   Soft, nontender  Extremities:  ++ peripheral edema  Neurologic:  Alert, oriented, able to follow commands  Skin:  No acute rashes  Right arm AV fistula  Basic Metabolic Panel:  Recent Labs  Lab 09/27/20 1539 09/28/20 0528 09/29/20 0445  NA 135 136 133*  K 4.6 4.7 5.0  CL 94* 96* 94*  CO2 26 27 27   GLUCOSE 79 73 87  BUN 45* 51*  63*  CREATININE 7.22* 8.15* 9.30*  CALCIUM 12.0* 11.4* 10.7*  MG  --   --  2.3     CBC: Recent Labs  Lab 09/27/20 1539 09/28/20 0528 09/29/20 0445 09/29/20 1119  WBC 7.5 5.3 4.8 5.1  NEUTROABS 5.0  --   --   --   HGB 11.1* 9.6* 8.6* 9.3*  HCT 36.7 29.4* 27.3* 28.9*  MCV 101.7* 98.0 98.6 97.6  PLT 260 238 230 251      Lab Results  Component Value Date   HEPBSAG Negative 01/19/2017   HEPBSAB Reactive 01/19/2017      Microbiology:  Recent Results (from the past 240 hour(s))  Resp Panel by RT-PCR (Flu A&B, Covid) Nasopharyngeal Swab     Status: None   Collection Time: 09/27/20  7:46 PM   Specimen: Nasopharyngeal Swab; Nasopharyngeal(NP) swabs in vial transport medium  Result Value Ref Range Status   SARS Coronavirus 2 by RT PCR NEGATIVE NEGATIVE Final    Comment: (NOTE) SARS-CoV-2 target nucleic acids are NOT DETECTED.  The SARS-CoV-2 RNA is generally detectable in upper respiratory specimens during the acute phase of infection. The lowest concentration of SARS-CoV-2 viral copies this assay can detect is 138 copies/mL. A negative result does not preclude SARS-Cov-2 infection and should not be used as the sole basis for treatment or other patient management decisions. A negative result may occur with  improper specimen collection/handling, submission of  specimen other than nasopharyngeal swab, presence of viral mutation(s) within the areas targeted by this assay, and inadequate number of viral copies(<138 copies/mL). A negative result must be combined with clinical observations, patient history, and epidemiological information. The expected result is Negative.  Fact Sheet for Patients:  EntrepreneurPulse.com.au  Fact Sheet for Healthcare Providers:  IncredibleEmployment.be  This test is no t yet approved or cleared by the [place] FDA and  has been authorized for detection and/or diagnosis of SARS-CoV-2 by FDA under an  Emergency Use Authorization (EUA). This EUA will remain  in effect (meaning this test can be used) for the duration of the COVID-19 declaration under Section 564(b)(1) of the Act, 21 U.S.C.section 360bbb-3(b)(1), unless the authorization is terminated  or revoked sooner.       Influenza A by PCR NEGATIVE NEGATIVE Final   Influenza B by PCR NEGATIVE NEGATIVE Final    Comment: (NOTE) The Xpert Xpress SARS-CoV-2/FLU/RSV plus assay is intended as an aid in the diagnosis of influenza from Nasopharyngeal swab specimens and should not be used as a sole basis for treatment. Nasal washings and aspirates are unacceptable for Xpert Xpress SARS-CoV-2/FLU/RSV testing.  Fact Sheet for Patients: EntrepreneurPulse.com.au  Fact Sheet for Healthcare Providers: IncredibleEmployment.be  This test is not yet approved or cleared by the [place] FDA and has been authorized for detection and/or diagnosis of SARS-CoV-2 by FDA under an Emergency Use Authorization (EUA). This EUA will remain in effect (meaning this test can be used) for the duration of the COVID-19 declaration under Section 564(b)(1) of the Act, 21 U.S.C. section 360bbb-3(b)(1), unless the authorization is terminated or revoked.  Performed at [facility], [address] [place], [place] [address]   Body fluid culture w Gram Stain     Status: None (Preliminary result)   Collection Time: 09/28/20  8:47 AM   Specimen: Pleura; Body Fluid  Result Value Ref Range Status   Specimen Description   Final    PLEURAL Performed at [facility], [address] [place], [place] [address]    Special Requests   Final    NONE Performed at [facility], [place], [place] [address]    Gram Stain NO WBC SEEN NO ORGANISMS SEEN   Final   Culture   Final    NO GROWTH < 12 HOURS Performed at [facility], [place] [address] [place], [place] [address]     Report Status PENDING  Incomplete    Coagulation Studies: No results for input(s): LABPROT, INR in the last 72 hours.  Urinalysis: No results for input(s): COLORURINE, LABSPEC, PHURINE, GLUCOSEU, HGBUR, BILIRUBINUR, KETONESUR, PROTEINUR, UROBILINOGEN, NITRITE, LEUKOCYTESUR in the last 72 hours.  Invalid input(s): APPERANCEUR    Imaging: CT Head Wo Contrast  Result Date: 09/27/2020 CLINICAL DATA:  Head trauma.  Pain. EXAM: CT HEAD WITHOUT CONTRAST TECHNIQUE: Contiguous axial images were obtained from the base of the skull through the vertex without intravenous contrast. COMPARISON:  March 26, 2014 FINDINGS: Brain: No subdural, epidural, or subarachnoid hemorrhage identified. Ventricles and sulci are stable. Mild white matter changes have increased since 2015. No acute cortical ischemia or infarct. Cerebellum, brainstem, and basal cisterns are normal. No mass effect or midline shift. Vascular: No hyperdense vessel or unexpected calcification. Skull: There is a lytic lesion associated with the left frontal bone on series 2, image 45, new since 2015. No other bony lesions are identified. Sinuses/Orbits: Paranasal sinuses are normal. There is opacification of scattered mastoid  air cells without bony erosion. The middle ears are well aerated. Other: None. IMPRESSION: 1. No acute intracranial abnormality. 2. Lytic lesion in the left frontal bone as above. This is new since 44 but otherwise age indeterminate. This lytic lesion could be due to the patient's history of multiple myeloma. Electronically Signed   By: Dorise Bullion III M.D   On: 09/27/2020 15:11   CT Hip Left Wo Contrast  Result Date: 09/27/2020 CLINICAL DATA:  Left hip pain since a fall 09/18/2020. The patient is unable to bear weight. EXAM: CT OF THE LEFT HIP WITHOUT CONTRAST TECHNIQUE: Multidetector CT imaging of the left hip was performed according to the standard protocol. Multiplanar CT image reconstructions were also generated.  COMPARISON:  Plain films left hip earlier today. CT abdomen and pelvis 07/13/2005 FINDINGS: Bones/Joint/Cartilage No fracture, dislocation or worrisome lesion is identified. There is some degenerative disease about the right hip with a subchondral cyst seen in the acetabulum. Small to moderate left hip joint effusion is present. Erosive change is seen about both SI joints. Ligaments Suboptimally assessed by CT. Muscles and Tendons Appear intact. Soft tissues Fat containing left inguinal hernia is seen. Sigmoid diverticulosis noted. IMPRESSION: Negative for fracture. Small to moderate left hip joint effusion is nonspecific and could be due to degenerative disease or synovitis. Erosive change about the SI joints new since 2006. Differential considerations include spondyloarthropathy or metabolic process such as secondary hyperparathyroidism. Electronically Signed   By: Inge Rise M.D.   On: 09/27/2020 15:13   US Venous Img Lower Unilateral Left  Result Date: 09/27/2020 CLINICAL DATA:  Left leg swelling. EXAM: LEFT LOWER EXTREMITY VENOUS DOPPLER ULTRASOUND TECHNIQUE: Gray-scale sonography with compression, as well as color and duplex ultrasound, were performed to evaluate the deep venous system(s) from the level of the common femoral vein through the popliteal and proximal calf veins. COMPARISON:  None. FINDINGS: VENOUS Normal compressibility of the common femoral, superficial femoral, and popliteal veins, as well as the visualized calf veins. Visualized portions of profunda femoral vein and great saphenous vein unremarkable. No filling defects to suggest DVT on grayscale or color Doppler imaging. Doppler waveforms show normal direction of venous flow, normal respiratory plasticity and response to augmentation. Limited views of the contralateral common femoral vein are unremarkable. OTHER None. Limitations: None. IMPRESSION: Negative exam. Electronically Signed   By: Inge Rise M.D.   On: 09/27/2020  13:35   DG Knee Complete 4 Views Left  Result Date: 09/27/2020 CLINICAL DATA:  Worsening left knee pain since a fall 09/18/2020. Initial encounter. EXAM: LEFT KNEE - COMPLETE 4+ VIEW COMPARISON:  None. FINDINGS: No acute bony or joint abnormality is identified. Osteoarthritis about the knee is worst in the medial compartment. Small joint effusion noted. IMPRESSION: No acute abnormality. 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 Electronically Signed   By: Inge Rise M.D.   On: 09/27/2020 14:00   DG Bone Survey Met  Result Date: 09/29/2020 CLINICAL DATA:  Multiple myeloma. EXAM: METASTATIC BONE SURVEY COMPARISON:  August 27, 2008.  August 07, 2020. FINDINGS: Mild cardiomegaly is noted. No definite lytic lesion is seen. Increased sclerosis of visualized skeleton is noted consistent with history of end-stage renal disease. IMPRESSION: No definite lytic lesion is seen. Increased sclerosis of visualized skeleton consistent with history of end-stage renal disease. Electronically Signed   By: Marijo Conception M.D.   On: 09/29/2020 11:28   DG Foot Complete Left  Result Date: 09/27/2020 CLINICAL DATA:  Worsening left  foot pain since a fall 09/18/2020. Initial encounter. EXAM: LEFT FOOT - COMPLETE 3+ VIEW COMPARISON:  None. FINDINGS: No acute bony or joint abnormality. The patient is status post amputation of the fifth toe. Mild midfoot osteoarthritis is seen. Extensive vascular calcifications. IMPRESSION: No acute finding. Electronically Signed   By: Inge Rise M.D.   On: 09/27/2020 14:02   DG FLUORO GUIDED NEEDLE PLC ASPIRATION/INJECTION LOC  Result Date: 09/28/2020 CLINICAL DATA:  Left hip joint effusion EXAM: LEFT HIP ASPIRATION UNDER FLUOROSCOPY COMPARISON:  None. FLUOROSCOPY TIME:  Fluoroscopy Time:  0.1 minute Radiation Exposure Index (if provided by the fluoroscopic device): 0.6 mGy Number of Acquired Spot Images: 0 PROCEDURE: Overlying skin prepped with Betadine,  draped in the usual sterile fashion, and infiltrated locally with buffered Lidocaine. Curved 20 gauge spinal needle advanced to the superolateral margin of the left femoral head-neck junction. Diagnostic injection of iodinated contrast demonstrates intra-articular spread without intravascular component. 5 mL of serosanguineous synovial fluid was aspirated from the left hip joint. Needle was removed. No immediate complications. Patient demonstrated no discomfort. IMPRESSION: Technically successful left hip aspiration under fluoroscopy. Electronically Signed   By: Kathreen Devoid   On: 09/28/2020 16:36   DG Hip Unilat W or Wo Pelvis 2-3 Views Left  Addendum Date: 09/27/2020   ADDENDUM REPORT: 09/27/2020 15:09 ADDENDUM: This addendum is given for the purpose of noting the patient has erosions about both SI joints. Electronically Signed   By: Inge Rise M.D.   On: 09/27/2020 15:09   Result Date: 09/27/2020 CLINICAL DATA:  Left hip pain since a fall 09/18/2020. Initial encounter. EXAM: DG HIP (WITH OR WITHOUT PELVIS) 2-3V LEFT COMPARISON:  None. FINDINGS: There is no evidence of hip fracture or dislocation. There is no evidence of arthropathy or other focal bone abnormality. IMPRESSION: Negative exam. Electronically Signed: By: Inge Rise M.D. On: 09/27/2020 14:03     Medications:   . sodium chloride    . sodium chloride     . apixaban  2.5 mg Oral BID  . atorvastatin  80 mg Oral Daily  . carvedilol  25 mg Oral BID WC  . Chlorhexidine Gluconate Cloth  6 each Topical Q0600  . cinacalcet  60 mg Oral BID WC  . epoetin (EPOGEN/PROCRIT) injection  4,000 Units Intravenous Q T,Th,Sa-HD  . gabapentin  300 mg Oral TID  . irbesartan  300 mg Oral QHS  . sevelamer carbonate  800 mg Oral QID   sodium chloride, sodium chloride, acetaminophen **OR** acetaminophen, alteplase, alum & mag hydroxide-simeth, dextromethorphan-guaiFENesin, heparin, hydrALAZINE, HYDROcodone-acetaminophen, hydrocortisone,  hydrocortisone cream, ipratropium-albuterol, lidocaine (PF), lidocaine-prilocaine, lip balm, loratadine, metoprolol tartrate, morphine injection, Muscle Rub, ondansetron **OR** ondansetron (ZOFRAN) IV, pentafluoroprop-tetrafluoroeth, phenol, polyvinyl alcohol, senna-docusate, sodium chloride  Assessment/ Plan:  75 y.o. female with end-stage renal disease, hypertension, gout, morbid obesity, multiple myeloma, rheumatoid arthritis, sleep apnea, hyperlipidemia paroxysmal atrial fibrillation, neuropathy multiple myeloma in remission, was admitted on 09/27/2020 for  Principal Problem:   Effusion of left hip Active Problems:   Morbid obesity ([place])   Essential hypertension   Multiple myeloma (H[facility])   HLD (hyperlipidemia)   Rheumatoid arthritis with positive rheumatoid factor (H[facility])  Septic arthritis ([facility]) [M00.9] Left leg pain [M79.605] Effusion of hip joint, left [M25.452] Effusion of left hip [M25.452]  [facility]TS/11:00 am/AVF  #. ESRD Scheduled for dialysis today Blood pressures are elevated Carvedilol started by primary team Continue holding midodrine and evaluating blood pressure during hospital course.  #. Anemia of CKD  Lab Results  Component Value Date   HGB 9.3 (L) 09/29/2020   Low dose EPO 4000 units with HD Hematology team evaluating for recurrence of myeloma.  #. Secondary hyperparathyroidism of renal origin N 25.81      Component Value Date/Time   PTH 345 (H) 08/10/2020 0859   Lab Results  Component Value Date   PHOS 6.1 (H) 08/15/2020   Monitor calcium and phos level during this admission   #.  Left hip and foot pain Hospitalist concern for septic joint given elevated inflammatory markers and inability to ambulate.  Left hip joint aspiration completed.   - no crystals. WBC 180 with 56 % neutrophils    LOS: 1 Colon Flattery 2/22/202211:57 [place], [place]   Patient was seen and  evaluated with Colon Flattery, NP.  Plan of care was discussed with patient as well as NP.  I agree with the note as documented with edits.

## 2020-09-29 NOTE — Progress Notes (Signed)
PROGRESS NOTE    DAMIYAH DITMARS  WGY:[id number] DOB: [birth year] DOA: 09/27/2020 PCP: Tracie Harrier, MD   Brief Narrative:  75 year old with past medical history of ESRD on HD MWF, HTN, gout, morbid obesity, multiple myeloma, seropositive RA, sleep apnea, HLD presents with left hip pain unable to bear weight.  Reports of falling on 09/18/2020.  Upon admission CT was negative for any fracture but showed moderate sized left effusion.  Joint aspiration was ordered.  Data showed red turbid aspiration but no evidence of crystals or infection was noted.  Eliquis was resumed.  PT recommended SNF.  Initial CT head also showed concerns of lytic lesion in her skull therefore oncology team was consulted.   Assessment & Plan:   Principal Problem:   Effusion of left hip Active Problems:   Morbid obesity ([place])   Essential hypertension   Multiple myeloma (HCC)   HLD (hyperlipidemia)   Rheumatoid arthritis with positive rheumatoid factor (HCC)  Left hip pain secondary to moderate size effusion History of rheumatoid arthritis and fall causing trauma -CT shows left-sided moderate effusion.  No evidence of fracture -Status post joint aspiration 2/21-red/turbid aspiration.  No evidence of WBC, organisms or crystals.  Continue to follow culture data.  No need for antibiotics.  Eliquis has been resumed. -Pain control, bowel regimen -PT/OT-SNF.  Arrangements to be made.  ESRD on HD MWF, right upper extremity AV fistula -Nephro team is following  Hyperlipidemia -Atorvastatin 80 mg  History of multiple myeloma -Currently in remission.  No longer follows with oncology but has new lytic lesion in the left frontal bone.  Case was discussed with patient's daughter.  Oncology team has been consulted  Rheumatoid arthritis, seropositive -Outpatient rheumatology follow-up  Paroxysmal A. fib -Resume Eliquis today. -Coreg 25 mg twice daily  Essential hypertension -On ARB  Peripheral  neuropathy -Gabapentin 10 mg 3 times daily  Sleep apnea -CPAP at bedtime   DVT prophylaxis: Subcu heparin Code Status: Full code Family Communication:  Daughter updated- [name]   Dispo: The patient is from: Home              Anticipated d/c is to: SNF              Anticipated d/c date is: 1 day              Patient currently Medically Stable for SNF.    Difficult to place patient No    Body mass index is 41.97 kg/m.   Subjective: Feels ok at this time. She is ok with SNF.   Review of Systems Otherwise negative except as per HPI, including: General = no fevers, chills, dizziness,  fatigue HEENT/EYES = negative for loss of vision, double vision, blurred vision,  sore throa Cardiovascular= negative for chest pain, palpitation Respiratory/lungs= negative for shortness of breath, cough, wheezing; hemoptysis,  Gastrointestinal= negative for nausea, vomiting, abdominal pain Genitourinary= negative for Dysuria MSK = Negative for arthralgia, myalgias Neurology= Negative for headache, numbness, tingling  Psychiatry= Negative for suicidal and homocidal ideation Skin= Negative for Rash   Examination:  Constitutional: Not in acute distress Respiratory: Clear to auscultation bilaterally Cardiovascular: Normal sinus rhythm, no rubs Abdomen: Nontender nondistended good bowel sounds Musculoskeletal: No edema noted Skin: No rashes seen Neurologic: CN 2-12 grossly intact.  And nonfocal Psychiatric: Normal judgment and insight. Alert and oriented x 3. Normal mood.     Objective: Vitals:   09/29/20 0011 09/29/20 0503 09/29/20 0750 09/29/20 0916  BP: 135/64 135/62 (!) 150/77 (!) 150/71  Pulse: (!) 58 (!) 59 64 68  Resp: _0 Temp: (!) 97.5 F (36.4 C) (!) 97.3 F (36.3 C) 97.7 F (36.5 C)   TempSrc: Oral Oral Oral   SpO2: 98% 98% 98%   Weight:      Height:        Intake/Output Summary (Last 24 hours) at 09/29/2020 1222 Last data filed at 09/29/2020 1050 Gross per  24 hour  Intake 220 ml  Output --  Net 220 ml   Filed Weights   09/27/20 1228  Weight: 117.9 kg     Data Reviewed:   CBC: Recent Labs  Lab 09/27/20 1539 09/28/20 0528 09/29/20 0445 09/29/20 1119  WBC 7.5 5.3 4.8 5.1  NEUTROABS 5.0  --   --   --   HGB 11.1* 9.6* 8.6* 9.3*  HCT 36.7 29.4* 27.3* 28.9*  MCV 101.7* 98.0 98.6 97.6  PLT 260 238 230 979   Basic Metabolic Panel: Recent Labs  Lab 09/27/20 1539 09/28/20 0528 09/29/20 0445  NA 135 136 133*  K 4.6 4.7 5.0  CL 94* 96* 94*  CO2 _1 GLUCOSE 79 73 87  BUN 45* 51* 63*  CREATININE 7.22* 8.15* 9.30*  CALCIUM 12.0* 11.4* 10.7*  MG  --   --  2.3   GFR: Estimated Creatinine Clearance: 7 mL/min (A) (by C-G formula based on SCr of 9.3 mg/dL (H)). Liver Function Tests: Recent Labs  Lab 09/29/20 0445  AST 8*  ALT 6  ALKPHOS 46  BILITOT 0.8  PROT 7.0  ALBUMIN 2.8*   No results for input(s): LIPASE, AMYLASE in the last 168 hours. No results for input(s): AMMONIA in the last 168 hours. Coagulation Profile: No results for input(s): INR, PROTIME in the last 168 hours. Cardiac Enzymes: No results for input(s): CKTOTAL, CKMB, CKMBINDEX, TROPONINI in the last 168 hours. BNP (last 3 results) No results for input(s): PROBNP in the last 8760 hours. HbA1C: No results for input(s): HGBA1C in the last 72 hours. CBG: Recent Labs  Lab 09/29/20 0752 09/29/20 1123  GLUCAP 88 102*   Lipid Profile: No results for input(s): CHOL, HDL, LDLCALC, TRIG, CHOLHDL, LDLDIRECT in the last 72 hours. Thyroid Function Tests: No results for input(s): TSH, T4TOTAL, FREET4, T3FREE, THYROIDAB in the last 72 hours. Anemia Panel: No results for input(s): VITAMINB12, FOLATE, FERRITIN, TIBC, IRON, RETICCTPCT in the last 72 hours. Sepsis Labs: No results for input(s): PROCALCITON, LATICACIDVEN in the last 168 hours.  Recent Results (from the past 240 hour(s))  Resp Panel by RT-PCR (Flu A&B, Covid) Nasopharyngeal Swab     Status:  None   Collection Time: 09/27/20  7:46 PM   Specimen: Nasopharyngeal Swab; Nasopharyngeal(NP) swabs in vial transport medium  Result Value Ref Range Status   SARS Coronavirus 2 by RT PCR NEGATIVE NEGATIVE Final    Comment: (NOTE) SARS-CoV-2 target nucleic acids are NOT DETECTED.  The SARS-CoV-2 RNA is generally detectable in upper respiratory specimens during the acute phase of infection. The lowest concentration of SARS-CoV-2 viral copies this assay can detect is 138 copies/mL. A negative result does not preclude SARS-Cov-2 infection and should not be used as the sole basis for treatment or other patient management decisions. A negative result may occur with  improper specimen collection/handling, submission of specimen other than nasopharyngeal swab, presence of viral mutation(s) within the areas targeted by this assay, and inadequate number of viral copies(<138 copies/mL). A negative result must be combined with clinical observations, patient  history, and epidemiological information. The expected result is Negative.  Fact Sheet for Patients:  EntrepreneurPulse.com.au  Fact Sheet for Healthcare Providers:  IncredibleEmployment.be  This test is no t yet approved or cleared by the [place] FDA and  has been authorized for detection and/or diagnosis of SARS-CoV-2 by FDA under an Emergency Use Authorization (EUA). This EUA will remain  in effect (meaning this test can be used) for the duration of the COVID-19 declaration under Section 564(b)(1) of the Act, 21 U.S.C.section 360bbb-3(b)(1), unless the authorization is terminated  or revoked sooner.       Influenza A by PCR NEGATIVE NEGATIVE Final   Influenza B by PCR NEGATIVE NEGATIVE Final    Comment: (NOTE) The Xpert Xpress SARS-CoV-2/FLU/RSV plus assay is intended as an aid in the diagnosis of influenza from Nasopharyngeal swab specimens and should not be used as a sole basis for  treatment. Nasal washings and aspirates are unacceptable for Xpert Xpress SARS-CoV-2/FLU/RSV testing.  Fact Sheet for Patients: EntrepreneurPulse.com.au  Fact Sheet for Healthcare Providers: IncredibleEmployment.be  This test is not yet approved or cleared by the [place] FDA and has been authorized for detection and/or diagnosis of SARS-CoV-2 by FDA under an Emergency Use Authorization (EUA). This EUA will remain in effect (meaning this test can be used) for the duration of the COVID-19 declaration under Section 564(b)(1) of the Act, 21 U.S.C. section 360bbb-3(b)(1), unless the authorization is terminated or revoked.  Performed at [facility], [address] [place], [place] [address]   Body fluid culture w Gram Stain     Status: None (Preliminary result)   Collection Time: 09/28/20  8:47 AM   Specimen: Pleura; Body Fluid  Result Value Ref Range Status   Specimen Description   Final    PLEURAL Performed at [facility], [address] [place], [place] [address]    Special Requests   Final    NONE Performed at [facility], [place], [place] [address]    Gram Stain NO WBC SEEN NO ORGANISMS SEEN   Final   Culture   Final    NO GROWTH < 12 HOURS Performed at [facility], [place] [address], [place], [place] [address]    Report Status PENDING  Incomplete         Radiology Studies: CT Head Wo Contrast  Result Date: 09/27/2020 CLINICAL DATA:  Head trauma.  Pain. EXAM: CT HEAD WITHOUT CONTRAST TECHNIQUE: Contiguous axial images were obtained from the base of the skull through the vertex without intravenous contrast. COMPARISON:  March 26, 2014 FINDINGS: Brain: No subdural, epidural, or subarachnoid hemorrhage identified. Ventricles and sulci are stable. Mild white matter changes have increased since 2015. No acute cortical ischemia or infarct. Cerebellum, brainstem, and basal  cisterns are normal. No mass effect or midline shift. Vascular: No hyperdense vessel or unexpected calcification. Skull: There is a lytic lesion associated with the left frontal bone on series 2, image 45, new since 2015. No other bony lesions are identified. Sinuses/Orbits: Paranasal sinuses are normal. There is opacification of scattered mastoid air cells without bony erosion. The middle ears are well aerated. Other: None. IMPRESSION: 1. No acute intracranial abnormality. 2. Lytic lesion in the left frontal bone as above. This is new since 89 but otherwise age indeterminate. This lytic lesion could be due to the patient's history of multiple myeloma. Electronically Signed   By: Dorise Bullion III M.D   On: 09/27/2020 15:11   CT  Hip Left Wo Contrast  Result Date: 09/27/2020 CLINICAL DATA:  Left hip pain since a fall 09/18/2020. The patient is unable to bear weight. EXAM: CT OF THE LEFT HIP WITHOUT CONTRAST TECHNIQUE: Multidetector CT imaging of the left hip was performed according to the standard protocol. Multiplanar CT image reconstructions were also generated. COMPARISON:  Plain films left hip earlier today. CT abdomen and pelvis 07/13/2005 FINDINGS: Bones/Joint/Cartilage No fracture, dislocation or worrisome lesion is identified. There is some degenerative disease about the right hip with a subchondral cyst seen in the acetabulum. Small to moderate left hip joint effusion is present. Erosive change is seen about both SI joints. Ligaments Suboptimally assessed by CT. Muscles and Tendons Appear intact. Soft tissues Fat containing left inguinal hernia is seen. Sigmoid diverticulosis noted. IMPRESSION: Negative for fracture. Small to moderate left hip joint effusion is nonspecific and could be due to degenerative disease or synovitis. Erosive change about the SI joints new since 2006. Differential considerations include spondyloarthropathy or metabolic process such as secondary hyperparathyroidism.  Electronically Signed   By: Inge Rise M.D.   On: 09/27/2020 15:13   US Venous Img Lower Unilateral Left  Result Date: 09/27/2020 CLINICAL DATA:  Left leg swelling. EXAM: LEFT LOWER EXTREMITY VENOUS DOPPLER ULTRASOUND TECHNIQUE: Gray-scale sonography with compression, as well as color and duplex ultrasound, were performed to evaluate the deep venous system(s) from the level of the common femoral vein through the popliteal and proximal calf veins. COMPARISON:  None. FINDINGS: VENOUS Normal compressibility of the common femoral, superficial femoral, and popliteal veins, as well as the visualized calf veins. Visualized portions of profunda femoral vein and great saphenous vein unremarkable. No filling defects to suggest DVT on grayscale or color Doppler imaging. Doppler waveforms show normal direction of venous flow, normal respiratory plasticity and response to augmentation. Limited views of the contralateral common femoral vein are unremarkable. OTHER None. Limitations: None. IMPRESSION: Negative exam. Electronically Signed   By: Inge Rise M.D.   On: 09/27/2020 13:35   DG Knee Complete 4 Views Left  Result Date: 09/27/2020 CLINICAL DATA:  Worsening left knee pain since a fall 09/18/2020. Initial encounter. EXAM: LEFT KNEE - COMPLETE 4+ VIEW COMPARISON:  None. FINDINGS: No acute bony or joint abnormality is identified. Osteoarthritis about the knee is worst in the medial compartment. Small joint effusion noted. IMPRESSION: No acute abnormality. 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 Electronically Signed   By: Inge Rise M.D.   On: 09/27/2020 14:00   DG Bone Survey Met  Result Date: 09/29/2020 CLINICAL DATA:  Multiple myeloma. EXAM: METASTATIC BONE SURVEY COMPARISON:  August 27, 2008.  August 07, 2020. FINDINGS: Mild cardiomegaly is noted. No definite lytic lesion is seen. Increased sclerosis of visualized skeleton is noted consistent with history of  end-stage renal disease. IMPRESSION: No definite lytic lesion is seen. Increased sclerosis of visualized skeleton consistent with history of end-stage renal disease. Electronically Signed   By: Marijo Conception M.D.   On: 09/29/2020 11:28   DG Foot Complete Left  Result Date: 09/27/2020 CLINICAL DATA:  Worsening left foot pain since a fall 09/18/2020. Initial encounter. EXAM: LEFT FOOT - COMPLETE 3+ VIEW COMPARISON:  None. FINDINGS: No acute bony or joint abnormality. The patient is status post amputation of the fifth toe. Mild midfoot osteoarthritis is seen. Extensive vascular calcifications. IMPRESSION: No acute finding. Electronically Signed   By: Inge Rise M.D.   On: 09/27/2020 14:02   DG FLUORO GUIDED NEEDLE PLC ASPIRATION/INJECTION  LOC  Result Date: 09/28/2020 CLINICAL DATA:  Left hip joint effusion EXAM: LEFT HIP ASPIRATION UNDER FLUOROSCOPY COMPARISON:  None. FLUOROSCOPY TIME:  Fluoroscopy Time:  0.1 minute Radiation Exposure Index (if provided by the fluoroscopic device): 0.6 mGy Number of Acquired Spot Images: 0 PROCEDURE: Overlying skin prepped with Betadine, draped in the usual sterile fashion, and infiltrated locally with buffered Lidocaine. Curved 20 gauge spinal needle advanced to the superolateral margin of the left femoral head-neck junction. Diagnostic injection of iodinated contrast demonstrates intra-articular spread without intravascular component. 5 mL of serosanguineous synovial fluid was aspirated from the left hip joint. Needle was removed. No immediate complications. Patient demonstrated no discomfort. IMPRESSION: Technically successful left hip aspiration under fluoroscopy. Electronically Signed   By: Kathreen Devoid   On: 09/28/2020 16:36   DG Hip Unilat W or Wo Pelvis 2-3 Views Left  Addendum Date: 09/27/2020   ADDENDUM REPORT: 09/27/2020 15:09 ADDENDUM: This addendum is given for the purpose of noting the patient has erosions about both SI joints. Electronically Signed    By: Inge Rise M.D.   On: 09/27/2020 15:09   Result Date: 09/27/2020 CLINICAL DATA:  Left hip pain since a fall 09/18/2020. Initial encounter. EXAM: DG HIP (WITH OR WITHOUT PELVIS) 2-3V LEFT COMPARISON:  None. FINDINGS: There is no evidence of hip fracture or dislocation. There is no evidence of arthropathy or other focal bone abnormality. IMPRESSION: Negative exam. Electronically Signed: By: Inge Rise M.D. On: 09/27/2020 14:03        Scheduled Meds: . apixaban  2.5 mg Oral BID  . atorvastatin  80 mg Oral Daily  . carvedilol  25 mg Oral BID WC  . Chlorhexidine Gluconate Cloth  6 each Topical Q0600  . cinacalcet  60 mg Oral BID WC  . epoetin (EPOGEN/PROCRIT) injection  4,000 Units Intravenous Q T,Th,Sa-HD  . gabapentin  300 mg Oral TID  . irbesartan  300 mg Oral QHS  . sevelamer carbonate  800 mg Oral QID   Continuous Infusions: . sodium chloride    . sodium chloride       LOS: 1 day   Time spent= 35 mins    Deicy Rusk Arsenio Loader, MD [facility]  If 7PM-7AM, please contact night-coverage  09/29/2020, 12:22 PM

## 2020-09-29 NOTE — TOC Progression Note (Signed)
Transition of Care [facility]) - Progression Note    Patient Details  Name: Cassandra Joseph MRN: [id number] Date of Birth: 15-Mar-1946  [facility] CM/SW Contact  Shelbie Hutching, RN Phone Number: 09/29/2020, 9:32 AM  Clinical Narrative:    PT and OT have recommended SNF and patient said yesterday that she would like to go back to [facility].  RNCM started SNF workup and will follow up with patient once bed offers are received.    Expected Discharge Plan: [place] Barriers to Discharge: Continued Medical Work up  Expected Discharge Plan and Services Expected Discharge Plan: [place]   Discharge Planning Services: CM Consult Post Acute Care Choice: [place] Living arrangements for the past 2 months: Single Family Home                 DME Arranged: N/A DME Agency: NA                   Social Determinants of Health (SDOH) Interventions    Readmission Risk Interventions Readmission Risk Prevention Plan 08/11/2020 06/25/2019 05/14/2019  Transportation Screening Complete Complete Complete  PCP or Specialist Appt within 3-5 Days Complete Complete Complete  HRI or Home Care Consult Complete Complete Complete  Social Work Consult for [place] Planning/Counseling Complete Complete Complete  Palliative Care Screening Not Applicable Not Applicable Not Applicable  Medication Review [employer] photographer) Complete Complete Complete  Some recent data might be hidden

## 2020-09-29 NOTE — Progress Notes (Signed)
OT Cancellation Note  Patient Details Name: Cassandra Joseph MRN: [id number] DOB: [birth year]   Cancelled Treatment:    Reason Eval/Treat Not Completed: Other (comment) . OT attempting to see pt for evaluation and met with staff reporting pt getting ready to go down for x-ray. OT to re-attempt when pt is next available.  Darleen Crocker, [place], OTR/L , CBIS ascom [phone]  09/29/20, 9:24 AM  09/29/2020, 9:24 AM

## 2020-09-29 NOTE — Evaluation (Signed)
Occupational Therapy Evaluation Patient Details Name: Cassandra Joseph MRN: [id number] DOB: [birth year] Today's Date: 09/29/2020    History of Present Illness Per MD notes: Pt is a 75 y.o. female with medical history significant for end-stage renal disease on hemodialysis M,W,F, HTN, gout, morbid obesity, multiple myeloma, rheumatoid arthritis seropositive, sleep apnea, hyperlipidemia, presents to the emergency department for chief concerns of left hip pain in unable to bear weight after a fall.  MD assessment include: Left hip pain secondary to moderate size effusion and RA and fall causing trauma.   Clinical Impression   Patient presenting with decreased I in self care,balance, endurance, safety awareness, and strength. Patient reports living at home with daughter PTA. Pt's daughter assists pt with functional transfer and self care tasks. Pt transfers into wheelchair with daughters assist to go to dialysis treatment. Sink bathing with assistance from daughter at baseline. Patient currently functioning at max A for B LEs and trunk to EOB. While seated, pt performed grooming tasks with close supervision. Lab also arriving to get blood while pt seated on EOB. Pt declined to attempt standing from EOB. Patient will benefit from acute OT to increase overall independence in the areas of ADLs, functional mobility, and safety awareness in order to safely discharge to next venue of care.    Follow Up Recommendations  SNF;Supervision/Assistance - 24 hour    Equipment Recommendations  Other (comment) (defer to next venue of care)       Precautions / Restrictions Precautions Precautions: Fall Restrictions Weight Bearing Restrictions: No      Mobility Bed Mobility Overal bed mobility: Needs Assistance Bed Mobility: Supine to Sit;Sit to Supine     Supine to sit: Mod assist Sit to supine: Max assist        Transfers                 General transfer comment: Pt refused attempt and asked  to attempt after dialysis    Balance Overall balance assessment: Needs assistance Sitting-balance support: Feet supported Sitting balance-Leahy Scale: Good Sitting balance - Comments: no LOB                                   ADL either performed or assessed with clinical judgement   ADL Overall ADL's : Needs assistance/impaired     Grooming: Wash/dry hands;Wash/dry face;Sitting;Set up;Supervision/safety                                       Vision Baseline Vision/History: No visual deficits Patient Visual Report: No change from baseline              Pertinent Vitals/Pain Pain Assessment: No/denies pain     Hand Dominance Left   Extremity/Trunk Assessment Upper Extremity Assessment Upper Extremity Assessment: Generalized weakness;RUE deficits/detail RUE Deficits / Details: baseline limited ROM and strength. Pt unable to demonstrate more than 30 degrees of shoulder elevation   Lower Extremity Assessment Lower Extremity Assessment: Generalized weakness   Cervical / Trunk Assessment Cervical / Trunk Assessment: Normal   Communication Communication Communication: No difficulties   Cognition Arousal/Alertness: Awake/alert Behavior During Therapy: WFL for tasks assessed/performed Overall Cognitive Status: Within Functional Limits for tasks assessed  Home Living Family/patient expects to be discharged to:: Private residence Living Arrangements: Children;Other relatives Available Help at Discharge: Family;Available 24 hours/day Type of Home: House Home Access: Ramped entrance     Home Layout: One level     Bathroom Shower/Tub: Teacher, early years/pre: Standard     Home Equipment: Environmental consultant - 2 wheels;Wheelchair - [employer];Adaptive equipment Adaptive Equipment: Sock aid Additional Comments: Family assist with bathing, meal prep, and chores.       Prior Functioning/Environment Level of Independence: Needs assistance  Gait / Transfers Assistance Needed: Mod Ind amb limited home distances with a PFW, history of falls, w/c for community access ADL's / Homemaking Assistance Needed: Family assist with bathing, meal prep, and chores. Pt sleeps in hospital bed.            OT Problem List: Decreased strength;Impaired balance (sitting and/or standing);Pain;Decreased safety awareness;Decreased activity tolerance;Decreased knowledge of use of DME or AE      OT Treatment/Interventions: Self-care/ADL training;DME and/or AE instruction;Therapeutic activities;Therapeutic exercise;Manual therapy;Modalities;Energy conservation;Patient/family education;Balance training    OT Goals(Current goals can be found in the care plan section) Acute Rehab OT Goals Patient Stated Goal: to move better OT Goal Formulation: With patient Time For Goal Achievement: 10/13/20  OT Frequency: Min 1X/week   Barriers to D/C: Other (comment)  none known at this time          AM-PAC OT "6 Clicks" Daily Activity     Outcome Measure Help from another person eating meals?: A Little Help from another person taking care of personal grooming?: A Little Help from another person toileting, which includes using toliet, bedpan, or urinal?: Total Help from another person bathing (including washing, rinsing, drying)?: A Lot Help from another person to put on and taking off regular upper body clothing?: A Lot Help from another person to put on and taking off regular lower body clothing?: Total 6 Click Score: 12   End of Session Nurse Communication: Mobility status  Activity Tolerance: Patient tolerated treatment well Patient left: in bed;with call bell/phone within reach;with nursing/sitter in room  OT Visit Diagnosis: Unsteadiness on feet (R26.81);Muscle weakness (generalized) (M62.81);History of falling (Z91.81);Pain Pain - Right/Left: Left Pain - part of body:  Hip                Time: 9628-3662 OT Time Calculation (min): 30 min Charges:  OT General Charges $OT Visit: 1 Visit OT Evaluation $OT Eval Moderate Complexity: 1 Mod OT Treatments $Self Care/Home Management : 8-22 mins $Therapeutic Activity: 8-22 mins  Darleen Crocker, MS, OTR/L , CBIS ascom [phone]  09/29/20, 1:09 PM

## 2020-09-29 NOTE — Consult Note (Signed)
[facility] CONSULT NOTE  Patient Care Team: Tracie Harrier, MD as PCP - General (Internal Medicine)  CHIEF COMPLAINTS/PURPOSE OF CONSULTATION: Lytic lesion scalp/history of multiple myeloma  HISTORY OF PRESENTING ILLNESS:  Cassandra Joseph 75 y.o.  female remote history of multiple myeloma; end-stage renal disease on dialysis-is currently admitted to hospital for worsening hip pain/joint effusion..  Patient is status post aspiration.   Patient had a fall prior to admission which led to a CT scan of the head noncontrast-which showed 8 mm lytic lesion.  Hematology has been consulted for further evaluation recommendations.  Of note patient's calcium is elevated around 10-12.  Patient's hemoglobin is 9.3.  Patient is getting dialysis while in hospital.  With regards to multiple myeloma followed by Dr. Inez Pilgrim. Bone marrow biopsy repeat [facility] October 06, 2005 29% plasma cells-[[facility]]s/p Velcade Decadron; discontinued Velcade in July 2007.  Recommend surveillance.  However review of records reveal patient has not followed up either at [facility] oncology or [facility].  Review of Systems  Constitutional: Positive for malaise/fatigue. Negative for chills, diaphoresis, fever and weight loss.  HENT: Negative for nosebleeds and sore throat.   Eyes: Negative for double vision.  Respiratory: Negative for cough, hemoptysis, sputum production, shortness of breath and wheezing.   Cardiovascular: Negative for chest pain, palpitations, orthopnea and leg swelling.  Gastrointestinal: Negative for abdominal pain, blood in stool, constipation, diarrhea, heartburn, melena, nausea and vomiting.  Genitourinary: Negative for dysuria, frequency and urgency.  Musculoskeletal: Positive for back pain and joint pain.  Skin: Negative.  Negative for itching and rash.  Neurological: Negative for dizziness, tingling, focal weakness, weakness and headaches.  Endo/Heme/Allergies: Does not bruise/bleed easily.   Psychiatric/Behavioral: Negative for depression. The patient is not nervous/anxious and does not have insomnia.      MEDICAL HISTORY:  Past Medical History:  Diagnosis Date  . 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 CHF (congestive heart failure) ([place])   . ESRD (end stage renal disease) ([place])    a. on HD x 11 years; b. felt to be 2/2 HTN; c. HD TTS  . History of nuclear stress test    a. 08/2015: probably normal myocardial perfusion study, no evidence for significant ischemia or scar was noted, during stress, global systolic function normal, EF 54%, RV borderline dilatation, coronary artery calcifications and marked mitral annular calcifications were noted, liver calcifications were noted on attenuation CT scan likely represent sequelae of previous granulomatous disease  . HLD (hyperlipidemia)   . Hypertension   . Ischemic stroke ([place])    a. 03/2014  . Metabolic syndrome   . Morbid obesity ([place])   . Multiple myeloma ([facility])    a. in remission  . Osteomyelitis ([place])    a. 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 Rheumatoid arthritis ([place])   . TIA (transient ischemic attack)    a. 2014    SURGICAL HISTORY: Past Surgical History:  Procedure Laterality Date  . ABDOMINAL HYSTERECTOMY    . APPENDECTOMY    . eyelid surgery    . HERNIA REPAIR    . right knee surgery      SOCIAL HISTORY: Social History   Socioeconomic History  . Marital status: Married    Spouse name: Not on file  . Number of children: Not on file  . Years of education: Not on file  . Highest education level: Not on file  Occupational History  . Not on file  Tobacco Use  . Smoking  status: Former Research scientist (life sciences)  . Smokeless tobacco: Never Used  . Tobacco comment: quit in 2007  Substance and Sexual Activity  . Alcohol use: No  . Drug use: No  . Sexual activity: Not Currently  Other Topics Concern  . Not on file  Social History Narrative   Independent at baseline, ambulates with a walker   Social Determinants  of Health   Financial Resource Strain: Not on file  Food Insecurity: Not on file  Transportation Needs: Not on file  Physical Activity: Not on file  Stress: Not on file  Social Connections: Not on file  Intimate Partner Violence: Not on file    FAMILY HISTORY: Family History  Problem Relation Age of Onset  . Seizures Mother   . Hypertension Mother   . Throat cancer Father   . Breast cancer Neg Hx     ALLERGIES:  is allergic to shellfish allergy.  MEDICATIONS:  Current Facility-Administered Medications  Medication Dose Route Frequency Provider Last Rate Last Admin  . 0.9 %  sodium chloride infusion  100 mL Intravenous PRN Breeze, Shantelle, NP      . 0.9 %  sodium chloride infusion  100 mL Intravenous PRN Colon Flattery, NP      . acetaminophen (TYLENOL) tablet 650 mg  650 mg Oral Q6H PRN Cox, Amy N, DO       Or  . acetaminophen (TYLENOL) suppository 325 mg  325 mg Rectal Q6H PRN Cox, Amy N, DO      . alteplase (CATHFLO ACTIVASE) injection 2 mg  2 mg Intracatheter Once PRN Colon Flattery, NP      . alum & mag hydroxide-simeth (MAALOX/MYLANTA) 200-200-20 MG/5ML suspension 30 mL  30 mL Oral Q4H PRN Amin, Ankit Chirag, MD      . apixaban (ELIQUIS) tablet 2.5 mg  2.5 mg Oral BID Amin, Ankit Chirag, MD   2.5 mg at 09/29/20 0904  . atorvastatin (LIPITOR) tablet 80 mg  80 mg Oral Daily Cox, Amy N, DO   80 mg at 09/29/20 0904  . carvedilol (COREG) tablet 25 mg  25 mg Oral BID WC Cox, Amy N, DO   25 mg at 09/29/20 0916  . Chlorhexidine Gluconate Cloth 2 % PADS 6 each  6 each Topical Q0600 Colon Flattery, NP   6 each at 09/29/20 1000  . cinacalcet (SENSIPAR) tablet 60 mg  60 mg Oral BID WC Cox, Amy N, DO   60 mg at 09/29/20 0904  . dextromethorphan-guaiFENesin (MUCINEX DM) 30-600 MG per 12 hr tablet 1 tablet  1 tablet Oral BID PRN Amin, Ankit Chirag, MD      . epoetin alfa (EPOGEN) injection 4,000 Units  4,000 Units Intravenous Q T,Th,Sa-HD Murlean Iba, MD   4,000 Units at  09/29/20 1354  . gabapentin (NEURONTIN) capsule 300 mg  300 mg Oral TID Cox, Amy N, DO   300 mg at 09/29/20 3151  . heparin injection 1,000 Units  1,000 Units Dialysis PRN Colon Flattery, NP      . hydrALAZINE (APRESOLINE) injection 10 mg  10 mg Intravenous Q4H PRN Amin, Ankit Chirag, MD      . HYDROcodone-acetaminophen (NORCO/VICODIN) 5-325 MG per tablet 1 tablet  1 tablet Oral Q6H PRN Cox, Amy N, DO      . hydrocortisone (ANUSOL-HC) 2.5 % rectal cream 1 application  1 application Topical QID PRN Amin, Ankit Chirag, MD      . hydrocortisone cream 1 % 1 application  1 application Topical TID PRN Amin,  Ankit Chirag, MD      . ipratropium-albuterol (DUONEB) 0.5-2.5 (3) MG/3ML nebulizer solution 3 mL  3 mL Nebulization Q4H PRN Amin, Ankit Chirag, MD      . irbesartan (AVAPRO) tablet 300 mg  300 mg Oral QHS Cox, Amy N, DO   300 mg at 09/28/20 2048  . lidocaine (PF) (XYLOCAINE) 1 % injection 5 mL  5 mL Intradermal PRN Breeze, Benancio Deeds, NP      . lidocaine-prilocaine (EMLA) cream 1 application  1 application Topical PRN Breeze, Shantelle, NP      . lip balm (BLISTEX) ointment 1 application  1 application Topical PRN Amin, Ankit Chirag, MD      . loratadine (CLARITIN) tablet 10 mg  10 mg Oral Daily PRN Amin, Ankit Chirag, MD      . metoprolol tartrate (LOPRESSOR) injection 5 mg  5 mg Intravenous Q4H PRN Amin, Ankit Chirag, MD      . morphine 2 MG/ML injection 1 mg  1 mg Intravenous Q2H PRN Cox, Amy N, DO      . Muscle Rub CREA 1 application  1 application Topical PRN Amin, Ankit Chirag, MD      . ondansetron (ZOFRAN) tablet 4 mg  4 mg Oral Q6H PRN Cox, Amy N, DO       Or  . ondansetron (ZOFRAN) injection 4 mg  4 mg Intravenous Q6H PRN Cox, Amy N, DO      . pentafluoroprop-tetrafluoroeth (GEBAUERS) aerosol 1 application  1 application Topical PRN Breeze, Shantelle, NP      . phenol (CHLORASEPTIC) mouth spray 1 spray  1 spray Mouth/Throat PRN Amin, Ankit Chirag, MD      . polyvinyl alcohol (LIQUIFILM  TEARS) 1.4 % ophthalmic solution 1 drop  1 drop Both Eyes PRN Amin, Ankit Chirag, MD      . senna-docusate (Senokot-S) tablet 1 tablet  1 tablet Oral QHS PRN Amin, Ankit Chirag, MD      . sevelamer carbonate (RENVELA) tablet 800 mg  800 mg Oral QID Cox, Amy N, DO   800 mg at 09/29/20 0905  . sodium chloride (OCEAN) 0.65 % nasal spray 2 spray  2 spray Each Nare PRN Amin, Ankit Chirag, MD          .  PHYSICAL EXAMINATION:  Vitals:   09/29/20 1504 09/29/20 1515  BP: 140/61 126/62  Pulse: (!) 53 67  Resp: (!) 22 19  Temp:    SpO2: 100% 100%   Filed Weights   09/27/20 1228  Weight: 260 lb (117.9 kg)    Physical Exam Constitutional:      Comments: Obese female patient resting in the bed.  No acute distress.   HENT:     Head: Normocephalic and atraumatic.     Mouth/Throat:     Mouth: Oropharynx is clear and moist.     Pharynx: No oropharyngeal exudate.  Eyes:     Pupils: Pupils are equal, round, and reactive to light.  Cardiovascular:     Rate and Rhythm: Normal rate and regular rhythm.  Pulmonary:     Effort: Pulmonary effort is normal. No respiratory distress.     Breath sounds: Normal breath sounds. No wheezing.  Abdominal:     General: Bowel sounds are normal. There is no distension.     Palpations: Abdomen is soft. There is no mass.     Tenderness: There is no abdominal tenderness. There is no guarding or rebound.  Musculoskeletal:  General: No tenderness or edema. Normal range of motion.     Cervical back: Normal range of motion and neck supple.  Skin:    General: Skin is warm.  Neurological:     Mental Status: She is alert and oriented to person, place, and time.  Psychiatric:        Mood and Affect: Affect normal.      LABORATORY DATA:  I have reviewed the data as listed Lab Results  Component Value Date   WBC 5.1 09/29/2020   HGB 9.3 (L) 09/29/2020   HCT 28.9 (L) 09/29/2020   MCV 97.6 09/29/2020   PLT 251 09/29/2020   Recent Labs     08/08/20 0016 08/09/20 0649 08/15/20 1002 09/27/20 1539 09/28/20 0528 09/29/20 0445  NA 140   < > 137 135 136 133*  K 4.3   < > 5.4* 4.6 4.7 5.0  CL 97*   < > 96* 94* 96* 94*  CO2 27   < > _0 GLUCOSE 82   < > 106* 79 73 87  BUN 51*   < > 69* 45* 51* 63*  CREATININE 9.29*   < > 9.86* 7.22* 8.15* 9.30*  CALCIUM 9.6   < > 9.2 12.0* 11.4* 10.7*  GFRNONAA 4*   < > 4* 6* 5* 4*  PROT 7.4  --   --   --   --  7.0  ALBUMIN 3.0*  --  3.1*  --   --  2.8*  AST 15  --   --   --   --  8*  ALT 10  --   --   --   --  6  ALKPHOS 60  --   --   --   --  46  BILITOT 0.9  --   --   --   --  0.8   < > = values in this interval not displayed.    RADIOGRAPHIC STUDIES: I have personally reviewed the radiological images as listed and agreed with the findings in the report. CT Head Wo Contrast  Result Date: 09/27/2020 CLINICAL DATA:  Head trauma.  Pain. EXAM: CT HEAD WITHOUT CONTRAST TECHNIQUE: Contiguous axial images were obtained from the base of the skull through the vertex without intravenous contrast. COMPARISON:  March 26, 2014 FINDINGS: Brain: No subdural, epidural, or subarachnoid hemorrhage identified. Ventricles and sulci are stable. Mild white matter changes have increased since 2015. No acute cortical ischemia or infarct. Cerebellum, brainstem, and basal cisterns are normal. No mass effect or midline shift. Vascular: No hyperdense vessel or unexpected calcification. Skull: There is a lytic lesion associated with the left frontal bone on series 2, image 45, new since 2015. No other bony lesions are identified. Sinuses/Orbits: Paranasal sinuses are normal. There is opacification of scattered mastoid air cells without bony erosion. The middle ears are well aerated. Other: None. IMPRESSION: 1. No acute intracranial abnormality. 2. Lytic lesion in the left frontal bone as above. This is new since 48 but otherwise age indeterminate. This lytic lesion could be due to the patient's history of  multiple myeloma. Electronically Signed   By: Dorise Bullion III M.D   On: 09/27/2020 15:11   CT Hip Left Wo Contrast  Result Date: 09/27/2020 CLINICAL DATA:  Left hip pain since a fall 09/18/2020. The patient is unable to bear weight. EXAM: CT OF THE LEFT HIP WITHOUT CONTRAST TECHNIQUE: Multidetector CT imaging of the left hip was performed according to  the standard protocol. Multiplanar CT image reconstructions were also generated. COMPARISON:  Plain films left hip earlier today. CT abdomen and pelvis 07/13/2005 FINDINGS: Bones/Joint/Cartilage No fracture, dislocation or worrisome lesion is identified. There is some degenerative disease about the right hip with a subchondral cyst seen in the acetabulum. Small to moderate left hip joint effusion is present. Erosive change is seen about both SI joints. Ligaments Suboptimally assessed by CT. Muscles and Tendons Appear intact. Soft tissues Fat containing left inguinal hernia is seen. Sigmoid diverticulosis noted. IMPRESSION: Negative for fracture. Small to moderate left hip joint effusion is nonspecific and could be due to degenerative disease or synovitis. Erosive change about the SI joints new since 2006. Differential considerations include spondyloarthropathy or metabolic process such as secondary hyperparathyroidism. Electronically Signed   By: Inge Rise M.D.   On: 09/27/2020 15:13   US Venous Img Lower Unilateral Left  Result Date: 09/27/2020 CLINICAL DATA:  Left leg swelling. EXAM: LEFT LOWER EXTREMITY VENOUS DOPPLER ULTRASOUND TECHNIQUE: Gray-scale sonography with compression, as well as color and duplex ultrasound, were performed to evaluate the deep venous system(s) from the level of the common femoral vein through the popliteal and proximal calf veins. COMPARISON:  None. FINDINGS: VENOUS Normal compressibility of the common femoral, superficial femoral, and popliteal veins, as well as the visualized calf veins. Visualized portions of profunda  femoral vein and great saphenous vein unremarkable. No filling defects to suggest DVT on grayscale or color Doppler imaging. Doppler waveforms show normal direction of venous flow, normal respiratory plasticity and response to augmentation. Limited views of the contralateral common femoral vein are unremarkable. OTHER None. Limitations: None. IMPRESSION: Negative exam. Electronically Signed   By: Inge Rise M.D.   On: 09/27/2020 13:35   DG Knee Complete 4 Views Left  Result Date: 09/27/2020 CLINICAL DATA:  Worsening left knee pain since a fall 09/18/2020. Initial encounter. EXAM: LEFT KNEE - COMPLETE 4+ VIEW COMPARISON:  None. FINDINGS: No acute bony or joint abnormality is identified. Osteoarthritis about the knee is worst in the medial compartment. Small joint effusion noted. IMPRESSION: No acute abnormality. 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 Electronically Signed   By: Inge Rise M.D.   On: 09/27/2020 14:00   DG Bone Survey Met  Result Date: 09/29/2020 CLINICAL DATA:  Multiple myeloma. EXAM: METASTATIC BONE SURVEY COMPARISON:  August 27, 2008.  August 07, 2020. FINDINGS: Mild cardiomegaly is noted. No definite lytic lesion is seen. Increased sclerosis of visualized skeleton is noted consistent with history of end-stage renal disease. IMPRESSION: No definite lytic lesion is seen. Increased sclerosis of visualized skeleton consistent with history of end-stage renal disease. Electronically Signed   By: Marijo Conception M.D.   On: 09/29/2020 11:28   DG Foot Complete Left  Result Date: 09/27/2020 CLINICAL DATA:  Worsening left foot pain since a fall 09/18/2020. Initial encounter. EXAM: LEFT FOOT - COMPLETE 3+ VIEW COMPARISON:  None. FINDINGS: No acute bony or joint abnormality. The patient is status post amputation of the fifth toe. Mild midfoot osteoarthritis is seen. Extensive vascular calcifications. IMPRESSION: No acute finding. Electronically Signed   By:  Inge Rise M.D.   On: 09/27/2020 14:02   DG FLUORO GUIDED NEEDLE PLC ASPIRATION/INJECTION LOC  Result Date: 09/28/2020 CLINICAL DATA:  Left hip joint effusion EXAM: LEFT HIP ASPIRATION UNDER FLUOROSCOPY COMPARISON:  None. FLUOROSCOPY TIME:  Fluoroscopy Time:  0.1 minute Radiation Exposure Index (if provided by the fluoroscopic device): 0.6 mGy Number of Acquired Spot Images:  0 PROCEDURE: Overlying skin prepped with Betadine, draped in the usual sterile fashion, and infiltrated locally with buffered Lidocaine. Curved 20 gauge spinal needle advanced to the superolateral margin of the left femoral head-neck junction. Diagnostic injection of iodinated contrast demonstrates intra-articular spread without intravascular component. 5 mL of serosanguineous synovial fluid was aspirated from the left hip joint. Needle was removed. No immediate complications. Patient demonstrated no discomfort. IMPRESSION: Technically successful left hip aspiration under fluoroscopy. Electronically Signed   By: Kathreen Devoid   On: 09/28/2020 16:36   DG Hip Unilat W or Wo Pelvis 2-3 Views Left  Addendum Date: 09/27/2020   ADDENDUM REPORT: 09/27/2020 15:09 ADDENDUM: This addendum is given for the purpose of noting the patient has erosions about both SI joints. Electronically Signed   By: Inge Rise M.D.   On: 09/27/2020 15:09   Result Date: 09/27/2020 CLINICAL DATA:  Left hip pain since a fall 09/18/2020. Initial encounter. EXAM: DG HIP (WITH OR WITHOUT PELVIS) 2-3V LEFT COMPARISON:  None. FINDINGS: There is no evidence of hip fracture or dislocation. There is no evidence of arthropathy or other focal bone abnormality. IMPRESSION: Negative exam. Electronically Signed: By: Inge Rise M.D. On: 09/27/2020 14:03    Multiple myeloma [facility]) #75 year old female patient with multiple medical problems including end-stage renal disease on dialysis; history of multiple myeloma-is currently admitted hospital for knee  pain/effusion/incidentally noted to have lytic lesion in the skull.  #Multiple myeloma-s/p Velcade dexamethasone treatment in 2007; given the lytic lesion noted on the skull/CT imaging; also given hypercalcemia-concerning for recurrence.  #End-stage renal disease/anemia [Dr.Singh]  #Joint effusion s/p aspiration question secondary to right arthritis.  Defer to primary team.  #Recommendations:  #Check schedule survey for further evaluation.  Check myeloma panel; kappa lambda light chain ratio.  Also check vitamin D/PTH/PTH related hormone levels.  Patient will also need outpatient work-up with PET scan.  Depending upon above work-up/labs/bone marrow biopsy could be considered.  However patient needs to recover from acute issues/septic arthritis.  Thank you Dr.Amin for allowing me to participate in the care of your pleasant patient. Please do not hesitate to contact me with questions or concerns in the interim.   All questions were answered. The patient knows to call the clinic with any problems, questions or concerns.    Cammie Sickle, MD 09/29/2020 4:05 PM

## 2020-09-29 NOTE — Progress Notes (Signed)
   09/29/20 1430  Clinical Encounter Type  Visited With Patient  Visit Type Initial  Referral From Physician  Consult/Referral To Chaplain   I visited with [name] to inquire about an Advanced Directive per the consult. I explained what the form was and asked if she would like to complete one, she declined.  [place], [place]

## 2020-09-29 NOTE — Assessment & Plan Note (Addendum)
#  75 year old female patient with multiple medical problems including end-stage renal disease on dialysis; history of multiple myeloma-is currently admitted hospital for knee pain/effusion/incidentally noted to have lytic lesion in the skull.  #History of multiple myeloma-s/p Velcade dexamethasone treatment in 2007.  At this admission 8 mm lytic lesion noted on the skull-however skeletal survey negative for any abnormal lytic lesions.  Serum myeloma panel shows M protein 1.2 g; kappa/lambda light chain ratio slightly abnormal.  Based on myeloma panel/negative skeletal survey clinically less likely to be because of hypercalcemia.  Status post bone marrow biopsy-awaiting pathology.  #Hypercalcemia calcium between 11 and 12-unclear etiology.  Status post pamidronate 60 mg over 6 hours on 2/25.  Calcium again today 11.  #End-stage renal disease/anemia [Dr.Singh]  #Joint effusion s/p aspiration question secondary to right arthritis.  Defer to primary team.  #Constipation-defer to primary team regarding enema.  #We will make plans for follow-up later in the week to review the results of the bone marrow biopsy.

## 2020-09-29 NOTE — NC FL2 (Signed)
[place] LEVEL OF CARE SCREENING TOOL     IDENTIFICATION  Patient Name: Cassandra Joseph Birthdate: [birth year] Sex: female Admission Date (Current Location): 09/27/2020  [place] and [place] Number:  Engineering geologist and Address:  [facility], [address] [place], [place] [address]      Provider Number: [id number]  Attending Physician Name and Address:  Damita Lack, MD  Relative Name and Phone Number:  Nicola Police (daughter) [phone]    Current Level of Care: Hospital Recommended Level of Care: [place] Prior Approval Number:    Date Approved/Denied:   PASRR Number: [id number]  Discharge Plan: SNF    Current Diagnoses: Patient Active Problem List   Diagnosis Date Noted  . Effusion of left hip 09/28/2020  . Septic arthritis ([place]) 09/27/2020  . Rheumatoid arthritis with positive rheumatoid factor ([place]) 09/27/2020  . Left leg pain   . Viral pneumonia 08/08/2020  . Pneumonia due to COVID-19 virus 08/07/2020  . History of anemia due to chronic kidney disease   . ESRD (end stage renal disease) ([place])   . SOB (shortness of breath)   . Acute pulmonary edema ([place]) 06/23/2019  . Acute on chronic diastolic CHF (congestive heart failure) ([place]) 01/18/2017  . HLD (hyperlipidemia) 01/18/2017  . Atypical chest pain 11/09/2016  . ESRD on hemodialysis ([place]) 11/09/2016  . Morbid obesity ([place]) 11/09/2016  . Essential hypertension 11/09/2016  . Multiple myeloma ([place]) 11/09/2016  . Elevated troponin 11/09/2016  . Metabolic syndrome 64/33/2951    Orientation RESPIRATION BLADDER Height & Weight     Self,Time,Situation,Place  O2 ([place] 2L Chronic) Continent Weight: 117.9 kg Height:  $Remove'5\' 6"'BRzhRoE$  (167.6 cm)  BEHAVIORAL SYMPTOMS/MOOD NEUROLOGICAL BOWEL NUTRITION STATUS      Continent Diet (Renal Diet 1200 ml fluid restriction)  AMBULATORY STATUS COMMUNICATION OF NEEDS Skin   Extensive Assist Verbally Normal                        Personal Care Assistance Level of Assistance  Bathing,Feeding,Dressing Bathing Assistance: Limited assistance Feeding assistance: Limited assistance Dressing Assistance: Limited assistance     Functional Limitations Info             SPECIAL CARE FACTORS FREQUENCY  PT (By licensed PT),OT (By licensed OT)     PT Frequency: 5 times per week OT Frequency: 5 times per week            Contractures Contractures Info: Not present    Additional Factors Info  Code Status,Allergies Code Status Info: Full Allergies Info: Shellfish           Current Medications (09/29/2020):  This is the current hospital active medication list Current Facility-Administered Medications  Medication Dose Route Frequency Provider Last Rate Last Admin  . 0.9 %  sodium chloride infusion  100 mL Intravenous PRN Breeze, Shantelle, NP      . 0.9 %  sodium chloride infusion  100 mL Intravenous PRN Colon Flattery, NP      . acetaminophen (TYLENOL) tablet 650 mg  650 mg Oral Q6H PRN Cox, Amy N, DO       Or  . acetaminophen (TYLENOL) suppository 325 mg  325 mg Rectal Q6H PRN Cox, Amy N, DO      . alteplase (CATHFLO ACTIVASE) injection 2 mg  2 mg Intracatheter Once PRN Colon Flattery, NP      . alum & mag hydroxide-simeth (MAALOX/MYLANTA) 200-200-20 MG/5ML suspension 30 mL  30 mL Oral Q4H PRN Amin, Ankit Chirag, MD      . apixaban (ELIQUIS) tablet 2.5 mg  2.5 mg Oral BID Amin, Ankit Chirag, MD   2.5 mg at 09/29/20 0904  . atorvastatin (LIPITOR) tablet 80 mg  80 mg Oral Daily Cox, Amy N, DO   80 mg at 09/29/20 0904  . carvedilol (COREG) tablet 25 mg  25 mg Oral BID WC Cox, Amy N, DO   25 mg at 09/28/20 2048  . Chlorhexidine Gluconate Cloth 2 % PADS 6 each  6 each Topical Q0600 Breeze, Shantelle, NP      . cinacalcet (SENSIPAR) tablet 60 mg  60 mg Oral BID WC Cox, Amy N, DO   60 mg at 09/29/20 0904  . dextromethorphan-guaiFENesin (MUCINEX DM) 30-600 MG per 12 hr tablet 1 tablet  1 tablet  Oral BID PRN Amin, Ankit Chirag, MD      . epoetin alfa (EPOGEN) injection 4,000 Units  4,000 Units Intravenous Q T,Th,Sa-HD Candiss Norse, Harmeet, MD      . gabapentin (NEURONTIN) capsule 300 mg  300 mg Oral TID Cox, Amy N, DO   300 mg at 09/29/20 9242  . heparin injection 1,000 Units  1,000 Units Dialysis PRN Colon Flattery, NP      . hydrALAZINE (APRESOLINE) injection 10 mg  10 mg Intravenous Q4H PRN Amin, Ankit Chirag, MD      . HYDROcodone-acetaminophen (NORCO/VICODIN) 5-325 MG per tablet 1 tablet  1 tablet Oral Q6H PRN Cox, Amy N, DO      . hydrocortisone (ANUSOL-HC) 2.5 % rectal cream 1 application  1 application Topical QID PRN Amin, Ankit Chirag, MD      . hydrocortisone cream 1 % 1 application  1 application Topical TID PRN Amin, Ankit Chirag, MD      . ipratropium-albuterol (DUONEB) 0.5-2.5 (3) MG/3ML nebulizer solution 3 mL  3 mL Nebulization Q4H PRN Amin, Ankit Chirag, MD      . irbesartan (AVAPRO) tablet 300 mg  300 mg Oral QHS Cox, Amy N, DO   300 mg at 09/28/20 2048  . lidocaine (PF) (XYLOCAINE) 1 % injection 5 mL  5 mL Intradermal PRN Breeze, Shantelle, NP      . lidocaine-prilocaine (EMLA) cream 1 application  1 application Topical PRN Breeze, Shantelle, NP      . lip balm (BLISTEX) ointment 1 application  1 application Topical PRN Amin, Ankit Chirag, MD      . loratadine (CLARITIN) tablet 10 mg  10 mg Oral Daily PRN Amin, Ankit Chirag, MD      . metoprolol tartrate (LOPRESSOR) injection 5 mg  5 mg Intravenous Q4H PRN Amin, Ankit Chirag, MD      . morphine 2 MG/ML injection 1 mg  1 mg Intravenous Q2H PRN Cox, Amy N, DO      . Muscle Rub CREA 1 application  1 application Topical PRN Amin, Ankit Chirag, MD      . ondansetron (ZOFRAN) tablet 4 mg  4 mg Oral Q6H PRN Cox, Amy N, DO       Or  . ondansetron (ZOFRAN) injection 4 mg  4 mg Intravenous Q6H PRN Cox, Amy N, DO      . pentafluoroprop-tetrafluoroeth (GEBAUERS) aerosol 1 application  1 application Topical PRN Breeze, Shantelle, NP       . phenol (CHLORASEPTIC) mouth spray 1 spray  1 spray Mouth/Throat PRN Amin, Ankit Chirag, MD      . polyvinyl alcohol (LIQUIFILM TEARS) 1.4 % ophthalmic  solution 1 drop  1 drop Both Eyes PRN Amin, Ankit Chirag, MD      . senna-docusate (Senokot-S) tablet 1 tablet  1 tablet Oral QHS PRN Amin, Ankit Chirag, MD      . sevelamer carbonate (RENVELA) tablet 800 mg  800 mg Oral QID Cox, Amy N, DO   800 mg at 09/29/20 0905  . sodium chloride (OCEAN) 0.65 % nasal spray 2 spray  2 spray Each Nare PRN Amin, Jeanella Flattery, MD         Discharge Medications: Please see discharge summary for a list of discharge medications.  Relevant Imaging Results:  Relevant Lab Results:   Additional Information SS #: [id number]  Dialysis [place] MWF  Shelbie Hutching, RN

## 2020-09-30 ENCOUNTER — Inpatient Hospital Stay: Payer: Medicare Other

## 2020-09-30 ENCOUNTER — Telehealth: Payer: Self-pay | Admitting: Internal Medicine

## 2020-09-30 DIAGNOSIS — M25452 Effusion, left hip: Secondary | ICD-10-CM | POA: Diagnosis not present

## 2020-09-30 LAB — CBC
HCT: 27.2 % — ABNORMAL LOW (ref 36.0–46.0)
Hemoglobin: 8.5 g/dL — ABNORMAL LOW (ref 12.0–15.0)
MCH: 31 pg (ref 26.0–34.0)
MCHC: 31.3 g/dL (ref 30.0–36.0)
MCV: 99.3 fL (ref 80.0–100.0)
Platelets: 212 10*3/uL (ref 150–400)
RBC: 2.74 MIL/uL — ABNORMAL LOW (ref 3.87–5.11)
RDW: 15.4 % (ref 11.5–15.5)
WBC: 4.5 10*3/uL (ref 4.0–10.5)
nRBC: 0 % (ref 0.0–0.2)

## 2020-09-30 LAB — COMPREHENSIVE METABOLIC PANEL
ALT: 7 U/L (ref 0–44)
AST: 9 U/L — ABNORMAL LOW (ref 15–41)
Albumin: 2.7 g/dL — ABNORMAL LOW (ref 3.5–5.0)
Alkaline Phosphatase: 46 U/L (ref 38–126)
Anion gap: 10 (ref 5–15)
BUN: 34 mg/dL — ABNORMAL HIGH (ref 8–23)
CO2: 29 mmol/L (ref 22–32)
Calcium: 10.2 mg/dL (ref 8.9–10.3)
Chloride: 94 mmol/L — ABNORMAL LOW (ref 98–111)
Creatinine, Ser: 6.18 mg/dL — ABNORMAL HIGH (ref 0.44–1.00)
GFR, Estimated: 7 mL/min — ABNORMAL LOW (ref >60)
Glucose, Bld: 84 mg/dL (ref 70–99)
Potassium: 4.3 mmol/L (ref 3.5–5.1)
Sodium: 133 mmol/L — ABNORMAL LOW (ref 135–145)
Total Bilirubin: 0.8 mg/dL (ref 0.3–1.2)
Total Protein: 7.2 g/dL (ref 6.5–8.1)

## 2020-09-30 LAB — HEPATITIS B SURFACE ANTIBODY, QUANTITATIVE: Hep B S AB Quant (Post): 42.9 m[IU]/mL (ref >9.9)

## 2020-09-30 LAB — KAPPA/LAMBDA LIGHT CHAINS
Kappa free light chain: 461.7 mg/L — ABNORMAL HIGH (ref 3.3–19.4)
Kappa, lambda light chain ratio: 1.86 — ABNORMAL HIGH (ref 0.26–1.65)
Lambda free light chains: 247.6 mg/L — ABNORMAL HIGH (ref 5.7–26.3)

## 2020-09-30 LAB — MAGNESIUM: Magnesium: 1.9 mg/dL (ref 1.7–2.4)

## 2020-09-30 LAB — GLUCOSE, CAPILLARY: Glucose-Capillary: 101 mg/dL — ABNORMAL HIGH (ref 70–99)

## 2020-09-30 LAB — PARATHYROID HORMONE, INTACT (NO CA): PTH: 242 pg/mL — ABNORMAL HIGH (ref 15–65)

## 2020-09-30 NOTE — TOC Progression Note (Signed)
Transition of Care [facility]) - Progression Note    Patient Details  Name: Cassandra Joseph MRN: [id number] Date of Birth: 02-Mar-1946  [facility] CM/SW Contact  Shelbie Hutching, RN Phone Number: 09/30/2020, 3:32 PM  Clinical Narrative:    RNCM met with patient and patient's daughter Jeannene Patella, at the bedside this morning.  Pam was hoping that [facility] in [place] would be able to offer a SNF bed but unfortunately they are not accepting dialysis patient's at this time.  Pam and the patient say they will accept the bed offer from [facility].  Kenney Houseman at [facility] says that they can take patient tomorrow.   Patient was having lower extremity weakness that needed further evaluation and imaging so patient not ready for DC today.     Expected Discharge Plan: [place] Barriers to Discharge: Continued Medical Work up  Expected Discharge Plan and Services Expected Discharge Plan: [place]   Discharge Planning Services: CM Consult Post Acute Care Choice: [place] Living arrangements for the past 2 months: Single Family Home                 DME Arranged: N/A DME Agency: NA                   Social Determinants of Health (SDOH) Interventions    Readmission Risk Interventions Readmission Risk Prevention Plan 08/11/2020 06/25/2019 05/14/2019  Transportation Screening Complete Complete Complete  PCP or Specialist Appt within 3-5 Days Complete Complete Complete  HRI or Home Care Consult Complete Complete Complete  Social Work Consult for [place] Planning/Counseling Complete Complete Complete  Palliative Care Screening Not Applicable Not Applicable Not Applicable  Medication Review Press photographer) Complete Complete Complete  Some recent data might be hidden

## 2020-09-30 NOTE — Plan of Care (Signed)
  Problem: Education: Goal: Knowledge of General Education information will improve Description: Including pain rating scale, medication(s)/side effects and non-pharmacologic comfort measures 09/30/2020 1213 by Cristela Blue, RN Outcome: Progressing 09/30/2020 1213 by Cristela Blue, RN Outcome: Progressing   Problem: Health Behavior/Discharge Planning: Goal: Ability to manage health-related needs will improve 09/30/2020 1213 by Cristela Blue, RN Outcome: Progressing 09/30/2020 1213 by Cristela Blue, RN Outcome: Progressing   Problem: Clinical Measurements: Goal: Ability to maintain clinical measurements within normal limits will improve 09/30/2020 1213 by Cristela Blue, RN Outcome: Progressing 09/30/2020 1213 by Cristela Blue, RN Outcome: Progressing Goal: Will remain free from infection 09/30/2020 1213 by Cristela Blue, RN Outcome: Progressing 09/30/2020 1213 by Cristela Blue, RN Outcome: Progressing Goal: Diagnostic test results will improve 09/30/2020 1213 by Cristela Blue, RN Outcome: Progressing 09/30/2020 1213 by Cristela Blue, RN Outcome: Progressing Goal: Respiratory complications will improve 09/30/2020 1213 by Cristela Blue, RN Outcome: Progressing 09/30/2020 1213 by Cristela Blue, RN Outcome: Progressing Goal: Cardiovascular complication will be avoided 09/30/2020 1213 by Cristela Blue, RN Outcome: Progressing 09/30/2020 1213 by Cristela Blue, RN Outcome: Progressing   Problem: Activity: Goal: Risk for activity intolerance will decrease 09/30/2020 1213 by Cristela Blue, RN Outcome: Progressing 09/30/2020 1213 by Cristela Blue, RN Outcome: Progressing   Problem: Nutrition: Goal: Adequate nutrition will be maintained 09/30/2020 1213 by Cristela Blue, RN Outcome: Progressing 09/30/2020 1213 by Cristela Blue, RN Outcome: Progressing   Problem: Coping: Goal: Level of anxiety will decrease 09/30/2020 1213 by Cristela Blue, RN Outcome:  Progressing 09/30/2020 1213 by Cristela Blue, RN Outcome: Progressing   Problem: Elimination: Goal: Will not experience complications related to bowel motility 09/30/2020 1213 by Cristela Blue, RN Outcome: Progressing 09/30/2020 1213 by Cristela Blue, RN Outcome: Progressing Goal: Will not experience complications related to urinary retention 09/30/2020 1213 by Cristela Blue, RN Outcome: Progressing 09/30/2020 1213 by Cristela Blue, RN Outcome: Progressing   Problem: Pain Managment: Goal: General experience of comfort will improve 09/30/2020 1213 by Cristela Blue, RN Outcome: Progressing 09/30/2020 1213 by Cristela Blue, RN Outcome: Progressing   Problem: Safety: Goal: Ability to remain free from injury will improve 09/30/2020 1213 by Cristela Blue, RN Outcome: Progressing 09/30/2020 1213 by Cristela Blue, RN Outcome: Progressing   Problem: Skin Integrity: Goal: Risk for impaired skin integrity will decrease 09/30/2020 1213 by Cristela Blue, RN Outcome: Progressing 09/30/2020 1213 by Cristela Blue, RN Outcome: Progressing

## 2020-09-30 NOTE — Telephone Encounter (Signed)
On 2/23-spoke with patient's daughter [name] regarding negative skeletal survey; only slightly abnormal kappa lambda light chain ratio.  Awaiting M protein.  Understands that we will need further work-up including PET scan/ bone marrow biopsy as outpatient.  GB

## 2020-09-30 NOTE — Telephone Encounter (Signed)
On 2/22- I spoke to pt's daughter, wanda- re: possibility of recurrent myeloma.  However work-up in progress.  We will keep family updated. GB

## 2020-09-30 NOTE — Progress Notes (Signed)
[facility], [place] 09/30/20  Subjective:   LOS: 2  Patient known to our practice from outpatient dialysis.  She dialyzes at [facility] on Monday Wednesday and Friday shift. Last dialysis was on 14th February.  Patient's main reason for admission is pain in the left foot and hip.    Patient seen resting in bed Completed all of breakfast Denies nausea and vomiting Currently on room air Denies shortness of breath    Objective:  Vital signs in last 24 hours:  Temp:  [97.4 F (36.3 C)-98.4 F (36.9 C)] 98.3 F (36.8 C) (02/23 0817) Pulse Rate:  [47-77] 64 (02/23 0817) Resp:  [15-24] 15 (02/23 0817) BP: (100-149)/(48-83) 131/58 (02/23 0817) SpO2:  [97 %-100 %] 97 % (02/23 0817)  Weight change:  Filed Weights   09/27/20 1228  Weight: 117.9 kg    Intake/Output:    Intake/Output Summary (Last 24 hours) at 09/30/2020 0951 Last data filed at 09/29/2020 1909 Gross per 24 hour  Intake 0 ml  Output 1003 ml  Net -1003 ml    Physical Exam: General:  No acute distress, laying in bed  HEENT  anicteric, moist oral mucous membrane  Pulm/lungs  normal breathing effort, lungs are clear  CVS/Heart  regular rhythm, no rub or gallop  Abdomen:   Soft, nontender  Extremities:  + peripheral edema  Neurologic:  Alert, oriented, able to follow commands  Skin:  No acute rashes  Right arm AV fistula  Basic Metabolic Panel:  Recent Labs  Lab 09/27/20 1539 09/28/20 0528 09/29/20 0445 09/30/20 0510  NA 135 136 133* 133*  K 4.6 4.7 5.0 4.3  CL 94* 96* 94* 94*  CO2 26 27 27 29   GLUCOSE 79 73 87 84  BUN 45* 51* 63* 34*  CREATININE 7.22* 8.15* 9.30* 6.18*  CALCIUM 12.0* 11.4* 10.7* 10.2  MG  --   --  2.3 1.9     CBC: Recent Labs  Lab 09/27/20 1539 09/28/20 0528 09/29/20 0445 09/29/20 1119 09/30/20 0510  WBC 7.5 5.3 4.8 5.1 4.5  NEUTROABS 5.0  --   --   --   --   HGB 11.1* 9.6* 8.6* 9.3* 8.5*  HCT 36.7 29.4* 27.3* 28.9* 27.2*  MCV  101.7* 98.0 98.6 97.6 99.3  PLT 260 238 230 251 212      Lab Results  Component Value Date   HEPBSAG NON REACTIVE 09/29/2020   HEPBSAB Reactive 01/19/2017      Microbiology:  Recent Results (from the past 240 hour(s))  Resp Panel by RT-PCR (Flu A&B, Covid) Nasopharyngeal Swab     Status: None   Collection Time: 09/27/20  7:46 PM   Specimen: Nasopharyngeal Swab; Nasopharyngeal(NP) swabs in vial transport medium  Result Value Ref Range Status   SARS Coronavirus 2 by RT PCR NEGATIVE NEGATIVE Final    Comment: (NOTE) SARS-CoV-2 target nucleic acids are NOT DETECTED.  The SARS-CoV-2 RNA is generally detectable in upper respiratory specimens during the acute phase of infection. The lowest concentration of SARS-CoV-2 viral copies this assay can detect is 138 copies/mL. A negative result does not preclude SARS-Cov-2 infection and should not be used as the sole basis for treatment or other patient management decisions. A negative result may occur with  improper specimen collection/handling, submission of specimen other than nasopharyngeal swab, presence of viral mutation(s) within the areas targeted by this assay, and inadequate number of viral copies(<138 copies/mL). A negative result must be combined with clinical observations, patient history,  and epidemiological information. The expected result is Negative.  Fact Sheet for Patients:  EntrepreneurPulse.com.au  Fact Sheet for Healthcare Providers:  IncredibleEmployment.be  This test is no t yet approved or cleared by the [place] FDA and  has been authorized for detection and/or diagnosis of SARS-CoV-2 by FDA under an Emergency Use Authorization (EUA). This EUA will remain  in effect (meaning this test can be used) for the duration of the COVID-19 declaration under Section 564(b)(1) of the Act, 21 U.S.C.section 360bbb-3(b)(1), unless the authorization is terminated  or revoked sooner.        Influenza A by PCR NEGATIVE NEGATIVE Final   Influenza B by PCR NEGATIVE NEGATIVE Final    Comment: (NOTE) The Xpert Xpress SARS-CoV-2/FLU/RSV plus assay is intended as an aid in the diagnosis of influenza from Nasopharyngeal swab specimens and should not be used as a sole basis for treatment. Nasal washings and aspirates are unacceptable for Xpert Xpress SARS-CoV-2/FLU/RSV testing.  Fact Sheet for Patients: EntrepreneurPulse.com.au  Fact Sheet for Healthcare Providers: IncredibleEmployment.be  This test is not yet approved or cleared by the [place] FDA and has been authorized for detection and/or diagnosis of SARS-CoV-2 by FDA under an Emergency Use Authorization (EUA). This EUA will remain in effect (meaning this test can be used) for the duration of the COVID-19 declaration under Section 564(b)(1) of the Act, 21 U.S.C. section 360bbb-3(b)(1), unless the authorization is terminated or revoked.  Performed at [facility], [address] [place], [place] [address]   Body fluid culture w Gram Stain     Status: None (Preliminary result)   Collection Time: 09/28/20  8:47 AM   Specimen: Pleura; Body Fluid  Result Value Ref Range Status   Specimen Description   Final    PLEURAL Performed at [facility], [address], [place], [place] [address]    Special Requests   Final    NONE Performed at [facility], [place],  [address]    Gram Stain NO WBC SEEN NO ORGANISMS SEEN   Final   Culture   Final    NO GROWTH 2 DAYS Performed at [facility], [place] [address] [place],  [address]    Report Status PENDING  Incomplete    Coagulation Studies: No results for input(s): LABPROT, INR in the last 72 hours.  Urinalysis: No results for input(s): COLORURINE, LABSPEC, PHURINE, GLUCOSEU, HGBUR, BILIRUBINUR, KETONESUR, PROTEINUR, UROBILINOGEN, NITRITE, LEUKOCYTESUR in  the last 72 hours.  Invalid input(s): APPERANCEUR    Imaging: DG Bone Survey Met  Result Date: 09/29/2020 CLINICAL DATA:  Multiple myeloma. EXAM: METASTATIC BONE SURVEY COMPARISON:  August 27, 2008.  August 07, 2020. FINDINGS: Mild cardiomegaly is noted. No definite lytic lesion is seen. Increased sclerosis of visualized skeleton is noted consistent with history of end-stage renal disease. IMPRESSION: No definite lytic lesion is seen. Increased sclerosis of visualized skeleton consistent with history of end-stage renal disease. Electronically Signed   By: Marijo Conception M.D.   On: 09/29/2020 11:28   DG FLUORO GUIDED NEEDLE PLC ASPIRATION/INJECTION LOC  Result Date: 09/28/2020 CLINICAL DATA:  Left hip joint effusion EXAM: LEFT HIP ASPIRATION UNDER FLUOROSCOPY COMPARISON:  None. FLUOROSCOPY TIME:  Fluoroscopy Time:  0.1 minute Radiation Exposure Index (if provided by the fluoroscopic device): 0.6 mGy Number of Acquired Spot Images: 0 PROCEDURE: Overlying skin prepped with Betadine, draped in the usual sterile fashion, and infiltrated locally with buffered Lidocaine. Curved 20 gauge spinal needle advanced  to the superolateral margin of the left femoral head-neck junction. Diagnostic injection of iodinated contrast demonstrates intra-articular spread without intravascular component. 5 mL of serosanguineous synovial fluid was aspirated from the left hip joint. Needle was removed. No immediate complications. Patient demonstrated no discomfort. IMPRESSION: Technically successful left hip aspiration under fluoroscopy. Electronically Signed   By: Kathreen Devoid   On: 09/28/2020 16:36     Medications:    . apixaban  2.5 mg Oral BID  . atorvastatin  80 mg Oral Daily  . carvedilol  25 mg Oral BID WC  . Chlorhexidine Gluconate Cloth  6 each Topical Q0600  . cinacalcet  60 mg Oral BID WC  . epoetin (EPOGEN/PROCRIT) injection  4,000 Units Intravenous Q T,Th,Sa-HD  . gabapentin  300 mg Oral TID  .  irbesartan  300 mg Oral QHS  . sevelamer carbonate  800 mg Oral QID   acetaminophen **OR** acetaminophen, alum & mag hydroxide-simeth, dextromethorphan-guaiFENesin, hydrALAZINE, hydrocortisone, hydrocortisone cream, ipratropium-albuterol, lip balm, loratadine, metoprolol tartrate, morphine injection, Muscle Rub, ondansetron **OR** ondansetron (ZOFRAN) IV, phenol, polyvinyl alcohol, senna-docusate, sodium chloride  Assessment/ Plan:  75 y.o. female with end-stage renal disease, hypertension, gout, morbid obesity, multiple myeloma, rheumatoid arthritis, sleep apnea, hyperlipidemia paroxysmal atrial fibrillation, neuropathy multiple myeloma in remission, was admitted on 09/27/2020 for  Principal Problem:   Effusion of left hip Active Problems:   Morbid obesity ([place])   Essential hypertension   Multiple myeloma (HCC)   HLD (hyperlipidemia)   Rheumatoid arthritis with positive rheumatoid factor (HCC)  Septic arthritis (HCC) [M00.9] Left leg pain [M79.605] Effusion of hip joint, left [M25.452] Effusion of left hip [M25.452]  [facility] TTS/11:00 am/AVF  #. ESRD Received diaylsis yesterday Removed 1L of fluid Blood pressures continue to flucuate Started Carvedilol yesterday Continue holding midodrine and evaluating blood pressure during hospital course. Next treatment scheduled for tomorrow  #. Anemia of CKD  Lab Results  Component Value Date   HGB 8.5 (L) 09/30/2020   Low dose EPO 4000 units with HD Hematology evaluating for recurrence of myeloma. Workup in progress  #. Secondary hyperparathyroidism of renal origin N 25.81      Component Value Date/Time   PTH 242 (H) 09/29/2020 1119   Lab Results  Component Value Date   PHOS 6.1 (H) 08/15/2020   Monitor calcium and phos level during this admission   #.  Left hip and foot pain Hospitalist concern for septic joint given elevated inflammatory markers and inability to ambulate.  Left hip joint aspiration  negative for WBC and crystals. Pain management    LOS: 2 Colon Flattery 2/23/20229:51 AM  [place], [place]   Patient was seen and evaluated with Colon Flattery, NP.  Plan of care was discussed with patient as well as NP.  I agree with the note as documented with edits.

## 2020-09-30 NOTE — Progress Notes (Signed)
Cassandra Joseph   DOB:23-Mar-1946   LS#:[id number]    Subjective: Patient denies any new shortness of breath or cough.  No fevers.  Continues to have hip pain.  Overall improved.  No nausea no vomiting.  Objective:  Vitals:   09/30/20 1222 09/30/20 1538  BP: 133/65 (!) 119/52  Pulse: (!) 59 (!) 57  Resp: 15 15  Temp: 98.5 F (36.9 C) 98.5 F (36.9 C)  SpO2: 100% 100%     Intake/Output Summary (Last 24 hours) at 09/30/2020 1658 Last data filed at 09/30/2020 1421 Gross per 24 hour  Intake 360 ml  Output -  Net 360 ml    Physical Exam Constitutional:      Comments: Sleepy but arousable.  HENT:     Head: Normocephalic and atraumatic.     Mouth/Throat:     Mouth: Oropharynx is clear and moist.     Pharynx: No oropharyngeal exudate.  Eyes:     Pupils: Pupils are equal, round, and reactive to light.  Cardiovascular:     Rate and Rhythm: Normal rate and regular rhythm.  Pulmonary:     Effort: No respiratory distress.     Breath sounds: No wheezing.     Comments: Decreased breath sounds at the bases. Abdominal:     General: Bowel sounds are normal. There is no distension.     Palpations: Abdomen is soft. There is no mass.     Tenderness: There is no abdominal tenderness. There is no guarding or rebound.  Musculoskeletal:        General: No tenderness or edema. Normal range of motion.     Cervical back: Normal range of motion and neck supple.  Skin:    General: Skin is warm.  Neurological:     Mental Status: She is alert and oriented to person, place, and time.  Psychiatric:        Mood and Affect: Affect normal.      Labs:  Lab Results  Component Value Date   WBC 4.5 09/30/2020   HGB 8.5 (L) 09/30/2020   HCT 27.2 (L) 09/30/2020   MCV 99.3 09/30/2020   PLT 212 09/30/2020   NEUTROABS 5.0 09/27/2020    Lab Results  Component Value Date   NA 133 (L) 09/30/2020   K 4.3 09/30/2020   CL 94 (L) 09/30/2020   CO2 29 09/30/2020    Studies:  MR LUMBAR SPINE WO  CONTRAST  Result Date: 09/30/2020 CLINICAL DATA:  Motor neuron disease. Progressive bilateral lower extremity weakness. EXAM: MRI LUMBAR SPINE WITHOUT CONTRAST TECHNIQUE: Multiplanar, multisequence MR imaging of the lumbar spine was performed. No intravenous contrast was administered. COMPARISON:  None. FINDINGS: Segmentation:  Standard. Alignment:  Mildly exaggerated lumbar lordosis. Vertebrae: Diffuse decrease of the T1 and T2 signal throughout the visualized spine, may be related to red marrow reconversion in the setting of chronic anemia. No acute fracture or evidence of discitis. Conus medullaris and cauda equina: Conus extends to the L1 level. Conus and cauda equina appear normal. Paraspinal and other soft tissues: Atrophic and multi-cystic kidneys. Disc levels: T12-L1: No spinal canal or neural foraminal stenosis. L1-2: No spinal canal or neural foraminal stenosis. L2-3: Shallow disc bulge and mild facet degenerative changes without significant spinal canal or neural foraminal stenosis. L3-4: Shallow disc bulge and mild facet degenerative changes without significant spinal canal or neural foraminal stenosis. L4-5: Disc bulge, advanced facet degenerative changes and ligamentum flavum redundancy resulting in mild spinal canal stenosis and mild bilateral neural  foraminal narrowing. L5-S1: Shallow disc bulge and moderate facet degenerative changes resulting in mild narrowing of the bilateral subarticular zones and mild left neural foraminal narrowing. IMPRESSION: 1. Mild lumbar spondylosis as described above, with mild spinal canal stenosis and mild bilateral neural foraminal narrowing at L4-5. 2. Mild narrowing of the bilateral subarticular zones and left neural foraminal narrowing at L5-S1. 3. Diffuse decrease of the T1 and T2 signal throughout the visualized spine, may be related to red marrow reconversion in the setting of chronic anemia. Electronically Signed   By: Pedro Earls M.D.   On:  09/30/2020 14:20   DG Bone Survey Met  Result Date: 09/29/2020 CLINICAL DATA:  Multiple myeloma. EXAM: METASTATIC BONE SURVEY COMPARISON:  August 27, 2008.  August 07, 2020. FINDINGS: Mild cardiomegaly is noted. No definite lytic lesion is seen. Increased sclerosis of visualized skeleton is noted consistent with history of end-stage renal disease. IMPRESSION: No definite lytic lesion is seen. Increased sclerosis of visualized skeleton consistent with history of end-stage renal disease. Electronically Signed   By: Marijo Conception M.D.   On: 09/29/2020 11:28    Multiple myeloma [facility]) #75 year old female patient with multiple medical problems including end-stage renal disease on dialysis; history of multiple myeloma-is currently admitted hospital for knee pain/effusion/incidentally noted to have lytic lesion in the skull.  #History of multiple myeloma-s/p Velcade dexamethasone treatment in 2007; given the lytic lesion noted on the skull-however skeletal survey negative for any abnormal lytic lesions.  Serum protein pending; kappa/lambda light chain ratio slightly abnormal.  Hypercalcemia etiology-unclear-await further work-up.  Plan further work-up outpatient PET scan/possible need for bone marrow biopsy.   #End-stage renal disease/anemia [Dr.Singh]  #Joint effusion s/p aspiration question secondary to right arthritis.  Defer to primary team.    Cammie Sickle, MD 09/30/2020  4:58 PM

## 2020-09-30 NOTE — Progress Notes (Signed)
Cassandra Joseph  FYB:[id number] DOB: 1946/03/03 DOA: 09/27/2020 PCP: Tracie Harrier, MD    Brief Narrative:  75 year old with a history of ESRD on HD MWF, HTN, gout, morbid obesity, MM, rheumatoid arthritis, sleep apnea, and HLD who presented to [facility] with severe left hip pain and the inability to ambulate/bear weight following a fall 09/18/2020.  CT of the hip at time of admission was negative for an acute fracture.  Aspiration revealed no evidence of crystals but some blood.  Further intervention was not required.  Ultimately physical therapy suggested SNF placement.  Incidentally during her hospital stay the patient was found to have a lytic lesion of the skull worrisome for recurrence of her MM.  Oncology is presently evaluating this issue.  Antimicrobials:  None  DVT prophylaxis: Apixaban  Subjective: Afebrile.  Vital signs stable.  Reports that she still has significant weakness in both legs.  On further history she describes slowly progressive worsening in bilateral lower extremities for 2 to 3 months.  This began with mild perceived weakness and instability on her feet but has recently progressed to the point the patient can no longer stand on her own.  She denies focal pain in the abdomen.  She denies fecal or bladder incontinence.  She denies chest pain nausea or vomiting.  Overall she states she feels much better though still weak in general.  She has not had recent imaging of her low back.  Assessment & Plan:  Left hip posttraumatic effusion with intractable pain Joint effusion confirmed on CT with no evidence of fracture -joint aspirated 2/21 with blood appreciated but no evidence of infection or crystals -PT/OT recommend SNF  Rheumatoid arthritis -impaired ambulation/deconditioning PT/OT recommend SNF placement  Progressive bilateral lower extremity weakness Symptoms somewhat worrisome -MRI lumbar spine ordered after discussion with the patient to rule out compressive  etiology  Suspected MM recurrence Lytic lesion noted in the left frontal bone - Oncology directing work-up with skeletal survey and myeloma panel as well as vitamin D and parathyroid levels -outpatient PET scan to be pursued  ESRD on HD -right upper extremity AV fistula Nephrology following  Anemia of CKD Erythropoietin per nephrology  HLD Continue atorvastatin  Paroxysmal atrial fibrillation Continue Eliquis and Coreg  Essential HTN Blood pressure well controlled  Peripheral neuropathy Continue gabapentin  Sleep apnea Continue nightly CPAP   Code Status: FULL CODE Family Communication:  Status is: Inpatient  Remains inpatient appropriate because:Ongoing diagnostic testing needed not appropriate for outpatient work up   Dispo: The patient is from: Home              Anticipated d/c is to: SNF              Anticipated d/c date is: 1 day              Patient currently is not medically stable to d/c.   Difficult to place patient No        Consultants:  none  Objective: Blood pressure (!) 131/58, pulse 64, temperature 98.3 F (36.8 C), resp. rate 15, height 5\' 6"  (1.676 m), weight 117.9 kg, SpO2 97 %.  Intake/Output Summary (Last 24 hours) at 09/30/2020 0856 Last data filed at 09/29/2020 1909 Gross per 24 hour  Intake 0 ml  Output 1003 ml  Net -1003 ml   Filed Weights   09/27/20 1228  Weight: 117.9 kg    Examination: General: No acute respiratory distress Lungs: Clear to auscultation bilaterally without wheezes or crackles Cardiovascular:  Regular rate and rhythm without murmur gallop or rub normal S1 and S2 Abdomen: Nontender, nondistended, soft, bowel sounds positive, no rebound, no ascites, no appreciable mass Extremities: No significant cyanosis, clubbing, or edema bilateral lower extremities - 4/5 strength bilateral lower extremity with no response to Babinski testing  CBC: Recent Labs  Lab 09/27/20 1539 09/28/20 0528 09/29/20 0445  09/29/20 1119 09/30/20 0510  WBC 7.5   < > 4.8 5.1 4.5  NEUTROABS 5.0  --   --   --   --   HGB 11.1*   < > 8.6* 9.3* 8.5*  HCT 36.7   < > 27.3* 28.9* 27.2*  MCV 101.7*   < > 98.6 97.6 99.3  PLT 260   < > 230 251 212   < > = values in this interval not displayed.   Basic Metabolic Panel: Recent Labs  Lab 09/28/20 0528 09/29/20 0445 09/30/20 0510  NA 136 133* 133*  K 4.7 5.0 4.3  CL 96* 94* 94*  CO2 27 27 29   GLUCOSE 73 87 84  BUN 51* 63* 34*  CREATININE 8.15* 9.30* 6.18*  CALCIUM 11.4* 10.7* 10.2  MG  --  2.3 1.9   GFR: Estimated Creatinine Clearance: 10.6 mL/min (A) (by C-G formula based on SCr of 6.18 mg/dL (H)).  Liver Function Tests: Recent Labs  Lab 09/29/20 0445 09/30/20 0510  AST 8* 9*  ALT 6 7  ALKPHOS 46 46  BILITOT 0.8 0.8  PROT 7.0 7.2  ALBUMIN 2.8* 2.7*    HbA1C: Hgb A1c MFr Bld  Date/Time Value Ref Range Status  08/07/2020 11:07 PM 5.2 4.8 - 5.6 % Final    Comment:    (NOTE) Pre diabetes:          5.7%-6.4%  Diabetes:              >6.4%  Glycemic control for   <7.0% adults with diabetes     CBG: Recent Labs  Lab 09/29/20 0752 09/29/20 1123 09/29/20 1644 09/30/20 0051  GLUCAP 88 102* 77 101*    Recent Results (from the past 240 hour(s))  Resp Panel by RT-PCR (Flu A&B, Covid) Nasopharyngeal Swab     Status: None   Collection Time: 09/27/20  7:46 PM   Specimen: Nasopharyngeal Swab; Nasopharyngeal(NP) swabs in vial transport medium  Result Value Ref Range Status   SARS Coronavirus 2 by RT PCR NEGATIVE NEGATIVE Final    Comment: (NOTE) SARS-CoV-2 target nucleic acids are NOT DETECTED.  The SARS-CoV-2 RNA is generally detectable in upper respiratory specimens during the acute phase of infection. The lowest concentration of SARS-CoV-2 viral copies this assay can detect is 138 copies/mL. A negative result does not preclude SARS-Cov-2 infection and should not be used as the sole basis for treatment or other patient management  decisions. A negative result may occur with  improper specimen collection/handling, submission of specimen other than nasopharyngeal swab, presence of viral mutation(s) within the areas targeted by this assay, and inadequate number of viral copies(<138 copies/mL). A negative result must be combined with clinical observations, patient history, and epidemiological information. The expected result is Negative.  Fact Sheet for Patients:  EntrepreneurPulse.com.au  Fact Sheet for Healthcare Providers:  IncredibleEmployment.be  This test is no t yet approved or cleared by the [place] FDA and  has been authorized for detection and/or diagnosis of SARS-CoV-2 by FDA under an Emergency Use Authorization (EUA). This EUA will remain  in effect (meaning this test can be used) for the  duration of the COVID-19 declaration under Section 564(b)(1) of the Act, 21 U.S.C.section 360bbb-3(b)(1), unless the authorization is terminated  or revoked sooner.       Influenza A by PCR NEGATIVE NEGATIVE Final   Influenza B by PCR NEGATIVE NEGATIVE Final    Comment: (NOTE) The Xpert Xpress SARS-CoV-2/FLU/RSV plus assay is intended as an aid in the diagnosis of influenza from Nasopharyngeal swab specimens and should not be used as a sole basis for treatment. Nasal washings and aspirates are unacceptable for Xpert Xpress SARS-CoV-2/FLU/RSV testing.  Fact Sheet for Patients: EntrepreneurPulse.com.au  Fact Sheet for Healthcare Providers: IncredibleEmployment.be  This test is not yet approved or cleared by the [place] FDA and has been authorized for detection and/or diagnosis of SARS-CoV-2 by FDA under an Emergency Use Authorization (EUA). This EUA will remain in effect (meaning this test can be used) for the duration of the COVID-19 declaration under Section 564(b)(1) of the Act, 21 U.S.C. section 360bbb-3(b)(1), unless the  authorization is terminated or revoked.  Performed at [facility], [address] [place], [place] [address]   Body fluid culture w Gram Stain     Status: None (Preliminary result)   Collection Time: 09/28/20  8:47 AM   Specimen: Pleura; Body Fluid  Result Value Ref Range Status   Specimen Description   Final    PLEURAL Performed at [facility], [address] [place], [place] [address]    Special Requests   Final    NONE Performed at [facility], [place], [place] [address]    Gram Stain NO WBC SEEN NO ORGANISMS SEEN   Final   Culture   Final    NO GROWTH < 12 HOURS Performed at [facility], [place] [address] [place], [place] [address]    Report Status PENDING  Incomplete     Scheduled Meds: . apixaban  2.5 mg Oral BID  . atorvastatin  80 mg Oral Daily  . carvedilol  25 mg Oral BID WC  . Chlorhexidine Gluconate Cloth  6 each Topical Q0600  . cinacalcet  60 mg Oral BID WC  . epoetin (EPOGEN/PROCRIT) injection  4,000 Units Intravenous Q T,Th,Sa-HD  . gabapentin  300 mg Oral TID  . irbesartan  300 mg Oral QHS  . sevelamer carbonate  800 mg Oral QID     LOS: 2 days   Cherene Altes, MD [facility] Office  [phone] Pager - Text Page per Amion  If 7PM-7AM, please contact night-coverage per Amion 09/30/2020, 8:56 AM

## 2020-09-30 NOTE — Progress Notes (Signed)
Physical Therapy Treatment Patient Details Name: Cassandra Joseph MRN: [id number] DOB: 03-Apr-1946 Today's Date: 09/30/2020    History of Present Illness 75 y.o. female with medical history significant for end-stage renal disease on hemodialysis M,W,F, HTN, gout, morbid obesity, multiple myeloma, rheumatoid arthritis seropositive, sleep apnea, hyperlipidemia, presents to the emergency department for chief concerns of left hip pain in unable to bear weight after a fall.  Left hip pain s/p fall, moderate size effusion and RA .    PT Comments    Pt showed good effort with PT session, but did need some initial encouragement to fully participate.  She was able to try sit to stand multiple times and with plenty of assist (and elevated surface) was able to stand ~100 seconds over 2 attempts (seated rest break in between).  She was able to do some AROM with exercises and even take some resistance, but pt does continue to be very weak and functionally limited.    Follow Up Recommendations  SNF;Supervision for mobility/OOB     Equipment Recommendations  None recommended by PT    Recommendations for Other Services       Precautions / Restrictions Precautions Precautions: Fall Restrictions Weight Bearing Restrictions: No    Mobility  Bed Mobility Overal bed mobility: Needs Assistance Bed Mobility: Supine to Sit;Sit to Supine     Supine to sit: Mod assist Sit to supine: Max assist   General bed mobility comments: Mod A for BLE and trunk control, max assist to get back to bed    Transfers Overall transfer level: Needs assistance Equipment used: Right platform walker Transfers: Sit to/from Stand Sit to Stand: Mod assist;Max assist;From elevated surface         General transfer comment: multiple sit to stand attempts, 2 successful bouts with heavy cuing and assist.  Able to maintain standing ~1 minute first stand then ~30 seconds.  She did lean back of LEs on bed, but with verbal and  phyiscal cuing was able to shift weight forward to the walker inconsistently.  Ambulation/Gait             General Gait Details: unable to ambulate, however with great effort she was able to shift L foot medially ~1" with some unweighting through walker.  Very limited functional mobility in standing   Stairs             Wheelchair Mobility    Modified Rankin (Stroke Patients Only)       Balance Overall balance assessment: Needs assistance Sitting-balance support: Feet supported Sitting balance-Leahy Scale: Good Sitting balance - Comments: no LOB   Standing balance support: Bilateral upper extremity supported Standing balance-Leahy Scale: Poor Standing balance comment: Pt leaning back, heavily reliant on walker and assist from PT                            Cognition Arousal/Alertness: Awake/alert Behavior During Therapy: WFL for tasks assessed/performed Overall Cognitive Status: Within Functional Limits for tasks assessed                                        Exercises General Exercises - Lower Extremity Ankle Circles/Pumps: AROM;10 reps Quad Sets: Strengthening;5 reps Short Arc Quad: AROM;5 reps Heel Slides: AAROM;AROM;5 reps Hip ABduction/ADduction: AROM;Strengthening;10 reps    General Comments        Pertinent Vitals/Pain Pain  Assessment: 0-10 Pain Score: 4  Pain Location: b/l LEs, but most notedly in L knee and ankle    Home Living                      Prior Function            PT Goals (current goals can now be found in the care plan section) Progress towards PT goals: Progressing toward goals    Frequency    Min 2X/week      PT Plan Current plan remains appropriate    Co-evaluation              AM-PAC PT "6 Clicks" Mobility   Outcome Measure  Help needed turning from your back to your side while in a flat bed without using bedrails?: A Lot Help needed moving from lying on your back  to sitting on the side of a flat bed without using bedrails?: A Lot Help needed moving to and from a bed to a chair (including a wheelchair)?: Total Help needed standing up from a chair using your arms (e.g., wheelchair or bedside chair)?: A Lot Help needed to walk in hospital room?: Total Help needed climbing 3-5 steps with a railing? : Total 6 Click Score: 9    End of Session Equipment Utilized During Treatment: Gait belt Activity Tolerance: Patient tolerated treatment well;Patient limited by fatigue Patient left: with bed alarm set;with call bell/phone within reach;with family/visitor present Nurse Communication: Mobility status PT Visit Diagnosis: History of falling (Z91.81);Difficulty in walking, not elsewhere classified (R26.2);Pain;Muscle weakness (generalized) (M62.81) Pain - part of body: Ankle and joints of foot     Time: 1194-1740 PT Time Calculation (min) (ACUTE ONLY): 40 min  Charges:  $Therapeutic Exercise: 8-22 mins $Therapeutic Activity: 23-37 mins                     Kreg Shropshire, DPT 09/30/2020, 4:56 PM

## 2020-10-01 DIAGNOSIS — M25452 Effusion, left hip: Secondary | ICD-10-CM | POA: Diagnosis not present

## 2020-10-01 LAB — RENAL FUNCTION PANEL
Albumin: 2.7 g/dL — ABNORMAL LOW (ref 3.5–5.0)
Anion gap: 13 (ref 5–15)
BUN: 50 mg/dL — ABNORMAL HIGH (ref 8–23)
CO2: 26 mmol/L (ref 22–32)
Calcium: 11.1 mg/dL — ABNORMAL HIGH (ref 8.9–10.3)
Chloride: 94 mmol/L — ABNORMAL LOW (ref 98–111)
Creatinine, Ser: 7.74 mg/dL — ABNORMAL HIGH (ref 0.44–1.00)
GFR, Estimated: 5 mL/min — ABNORMAL LOW
Glucose, Bld: 85 mg/dL (ref 70–99)
Phosphorus: 7.1 mg/dL — ABNORMAL HIGH (ref 2.5–4.6)
Potassium: 4.9 mmol/L (ref 3.5–5.1)
Sodium: 133 mmol/L — ABNORMAL LOW (ref 135–145)

## 2020-10-01 LAB — MULTIPLE MYELOMA PANEL, SERUM
Albumin SerPl Elph-Mcnc: 3.2 g/dL (ref 2.9–4.4)
Albumin/Glob SerPl: 0.9 (ref 0.7–1.7)
Alpha 1: 0.4 g/dL (ref 0.0–0.4)
Alpha2 Glob SerPl Elph-Mcnc: 0.7 g/dL (ref 0.4–1.0)
B-Globulin SerPl Elph-Mcnc: 1 g/dL (ref 0.7–1.3)
Gamma Glob SerPl Elph-Mcnc: 2 g/dL — ABNORMAL HIGH (ref 0.4–1.8)
Globulin, Total: 4 g/dL — ABNORMAL HIGH (ref 2.2–3.9)
IgA: 299 mg/dL (ref 64–422)
IgG (Immunoglobin G), Serum: 2310 mg/dL — ABNORMAL HIGH (ref 586–1602)
IgM (Immunoglobulin M), Srm: 66 mg/dL (ref 26–217)
M Protein SerPl Elph-Mcnc: 1.2 g/dL — ABNORMAL HIGH
Total Protein ELP: 7.2 g/dL (ref 6.0–8.5)

## 2020-10-01 MED ORDER — MIDODRINE HCL 5 MG PO TABS
10.0000 mg | ORAL_TABLET | Freq: Three times a day (TID) | ORAL | Status: DC
  Administered 2020-10-01 – 2020-10-05 (×13): 10 mg via ORAL
  Filled 2020-10-01 (×13): qty 2

## 2020-10-01 MED ORDER — MIDODRINE HCL 5 MG PO TABS: 10.0000 mg | ORAL_TABLET | Freq: Three times a day (TID) | ORAL | Status: DC

## 2020-10-01 NOTE — TOC Progression Note (Signed)
Transition of Care [facility]) - Progression Note    Patient Details  Name: Cassandra Joseph MRN: [id number] Date of Birth: 29-Oct-1945  [facility]) CM/SW Contact  Shelbie Hutching, RN Phone Number: 10/01/2020, 2:45 PM  Clinical Narrative:    Before patient can discharge to [place] she needs to have a high resolution CT scan and bone marrow biopsy as recommended by oncology.  Patient did get dialysis today.  TOC will cont to follow.   Expected Discharge Plan: [place] Barriers to Discharge: Continued Medical Work up  Expected Discharge Plan and Services Expected Discharge Plan: [place]   Discharge Planning Services: CM Consult Post Acute Care Choice: Index Living arrangements for the past 2 months: Single Family Home                 DME Arranged: N/A DME Agency: NA                   Social Determinants of Health (SDOH) Interventions    Readmission Risk Interventions Readmission Risk Prevention Plan 08/11/2020 06/25/2019 05/14/2019  Transportation Screening Complete Complete Complete  PCP or Specialist Appt within 3-5 Days Complete Complete Complete  HRI or Home Care Consult Complete Complete Complete  Social Work Consult for [place] Planning/Counseling Complete Complete Complete  Palliative Care Screening Not Applicable Not Applicable Not Applicable  Medication Review [employer] photographer) Complete Complete Complete  Some recent data might be hidden

## 2020-10-01 NOTE — Progress Notes (Signed)
Cassandra Joseph  OXB:[id number] DOB: 1946-04-09 DOA: 09/27/2020 PCP: Tracie Harrier, MD    Brief Narrative:  75 year old with a history of ESRD on HD MWF, HTN, gout, morbid obesity, MM, rheumatoid arthritis, sleep apnea, and HLD who presented to [facility] with severe left hip pain and the inability to ambulate/bear weight following a fall 09/18/2020.  CT of the hip at time of admission was negative for an acute fracture.  Aspiration revealed no evidence of crystals but some blood.  Further intervention was not required.  Ultimately physical therapy suggested SNF placement.  Incidentally during her hospital stay the patient was found to have a lytic lesion of the skull worrisome for recurrence of her MM.  Oncology is presently evaluating this issue.  Antimicrobials:  None  DVT prophylaxis: Apixaban  Subjective: Resting comfortably. C/o ongoing generalized weakness, worse in legs B. Denies cp, n/v, or abdom pain.   Assessment & Plan:  Idiopathic hypercalcemia Ca2+ has been noted to be persistently modestly elevated during this admit - Oncology investigating potential etiologies w/ bone marrow and HRCT of the chest therefore d/c has been delayed   Left hip posttraumatic effusion with intractable pain Joint effusion confirmed on CT with no evidence of fracture - joint aspirated 2/21 with blood appreciated but no evidence of infection or crystals -PT/OT recommend SNF  Rheumatoid arthritis -impaired ambulation/deconditioning PT/OT recommend SNF placement  Progressive bilateral lower extremity weakness MRI lumbar spine revealed no soft tissue mass or severe spinal cord compression  Suspected MM recurrence Lytic lesion noted in the left frontal bone- Oncology directed work-up - skeletal survey negative for abnormal lytic lesions - kappa/lambda light chain ratio slightly abnormal- outpatient PET scan to be pursued +/- outpt bone marrow biopsy   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  fistula Nephrology followed th/o hospital stay   Anemia of CKD Erythropoietin per Nephrology - Hgb stable at ~8.5   HLD Continue atorvastatin  Paroxysmal atrial fibrillation Continue Eliquis and Coreg - HR well controlled during this admit   Essential HTN Blood pressure well controlled  Peripheral neuropathy Continue gabapentin  Sleep apnea Continue nightly CPAP   Code Status: FULL CODE Family Communication:  Status is: Inpatient  Remains inpatient appropriate because:Ongoing diagnostic testing needed not appropriate for outpatient work up   Dispo: The patient is from: Home              Anticipated d/c is to: SNF              Anticipated d/c date is: 1 day              Patient currently is not medically stable to d/c.   Difficult to place patient No  Consultants:  none  Objective: Blood pressure (!) 126/59, pulse (!) 57, temperature 98.1 F (36.7 C), resp. rate 18, height _0  (1.676 m), weight 117.9 kg, SpO2 100 %.  Intake/Output Summary (Last 24 hours) at 10/01/2020 1436 Last data filed at 10/01/2020 1300 Gross per 24 hour  Intake --  Output 1500 ml  Net -1500 ml   Filed Weights   09/27/20 1228  Weight: 117.9 kg    Examination: General: No acute respiratory distress Lungs: CTA B - no wheezing  Cardiovascular: RRR - no M or rub  Abdomen: NT/ND, soft, overweight, bs+ Extremities: No significant C/C/E B LE   CBC: Recent Labs  Lab 09/27/20 1539 09/28/20 0528 09/29/20 0445 09/29/20 1119 09/30/20 0510  WBC 7.5   < > 4.8 5.1 4.5  NEUTROABS 5.0  --   --   --   --   HGB 11.1*   < > 8.6* 9.3* 8.5*  HCT 36.7   < > 27.3* 28.9* 27.2*  MCV 101.7*   < > 98.6 97.6 99.3  PLT 260   < > 230 251 212   < > = values in this interval not displayed.   Basic Metabolic Panel: Recent Labs  Lab 09/29/20 0445 09/30/20 0510 10/01/20 0509  NA 133* 133* 133*  K 5.0 4.3 4.9  CL 94* 94* 94*  CO2 _0 GLUCOSE 87 84 85  BUN 63* 34* 50*  CREATININE  9.30* 6.18* 7.74*  CALCIUM 10.7* 10.2 11.1*  MG 2.3 1.9  --   PHOS  --   --  7.1*   GFR: Estimated Creatinine Clearance: 8.5 mL/min (A) (by C-G formula based on SCr of 7.74 mg/dL (H)).  Liver Function Tests: Recent Labs  Lab 09/29/20 0445 09/30/20 0510 10/01/20 0509  AST 8* 9*  --   ALT 6 7  --   ALKPHOS 46 46  --   BILITOT 0.8 0.8  --   PROT 7.0 7.2  --   ALBUMIN 2.8* 2.7* 2.7*    HbA1C: Hgb A1c MFr Bld  Date/Time Value Ref Range Status  08/07/2020 11:07 PM 5.2 4.8 - 5.6 % Final    Comment:    (NOTE) Pre diabetes:          5.7%-6.4%  Diabetes:              >6.4%  Glycemic control for   <7.0% adults with diabetes     CBG: Recent Labs  Lab 09/29/20 0752 09/29/20 1123 09/29/20 1644 09/30/20 0051  GLUCAP 88 102* 77 101*    Recent Results (from the past 240 hour(s))  Resp Panel by RT-PCR (Flu A&B, Covid) Nasopharyngeal Swab     Status: None   Collection Time: 09/27/20  7:46 PM   Specimen: Nasopharyngeal Swab; Nasopharyngeal(NP) swabs in vial transport medium  Result Value Ref Range Status   SARS Coronavirus 2 by RT PCR NEGATIVE NEGATIVE Final    Comment: (NOTE) SARS-CoV-2 target nucleic acids are NOT DETECTED.  The SARS-CoV-2 RNA is generally detectable in upper respiratory specimens during the acute phase of infection. The lowest concentration of SARS-CoV-2 viral copies this assay can detect is 138 copies/mL. A negative result does not preclude SARS-Cov-2 infection and should not be used as the sole basis for treatment or other patient management decisions. A negative result may occur with  improper specimen collection/handling, submission of specimen other than nasopharyngeal swab, presence of viral mutation(s) within the areas targeted by this assay, and inadequate number of viral copies(<138 copies/mL). A negative result must be combined with clinical observations, patient history, and epidemiological information. The expected result is  Negative.  Fact Sheet for Patients:  EntrepreneurPulse.com.au  Fact Sheet for Healthcare Providers:  IncredibleEmployment.be  This test is no t yet approved or cleared by the [place] FDA and  has been authorized for detection and/or diagnosis of SARS-CoV-2 by FDA under an Emergency Use Authorization (EUA). This EUA will remain  in effect (meaning this test can be used) for the duration of the COVID-19 declaration under Section 564(b)(1) of the Act, 21 U.S.C.section 360bbb-3(b)(1), unless the authorization is terminated  or revoked sooner.       Influenza A by PCR NEGATIVE NEGATIVE Final   Influenza B by PCR NEGATIVE NEGATIVE Final    Comment: (  NOTE) The Xpert Xpress SARS-CoV-2/FLU/RSV plus assay is intended as an aid in the diagnosis of influenza from Nasopharyngeal swab specimens and should not be used as a sole basis for treatment. Nasal washings and aspirates are unacceptable for Xpert Xpress SARS-CoV-2/FLU/RSV testing.  Fact Sheet for Patients: EntrepreneurPulse.com.au  Fact Sheet for Healthcare Providers: IncredibleEmployment.be  This test is not yet approved or cleared by the [place] FDA and has been authorized for detection and/or diagnosis of SARS-CoV-2 by FDA under an Emergency Use Authorization (EUA). This EUA will remain in effect (meaning this test can be used) for the duration of the COVID-19 declaration under Section 564(b)(1) of the Act, 21 U.S.C. section 360bbb-3(b)(1), unless the authorization is terminated or revoked.  Performed at [facility], [address] [place], [place] [address]   Body fluid culture w Gram Stain     Status: None (Preliminary result)   Collection Time: 09/28/20  8:47 AM   Specimen: Pleura; Body Fluid  Result Value Ref Range Status   Specimen Description   Final    PLEURAL Performed at [facility], [address],  [place], [name] [address]    Special Requests   Final    NONE Performed at [facility], [place], [place] [address]    Gram Stain NO WBC SEEN NO ORGANISMS SEEN   Final   Culture   Final    NO GROWTH 3 DAYS Performed at [facility], [place] [address] [place], [place] [address]    Report Status PENDING  Incomplete     Scheduled Meds: . apixaban  2.5 mg Oral BID  . atorvastatin  80 mg Oral Daily  . carvedilol  25 mg Oral BID WC  . Chlorhexidine Gluconate Cloth  6 each Topical Q0600  . cinacalcet  60 mg Oral BID WC  . epoetin (EPOGEN/PROCRIT) injection  4,000 Units Intravenous Q T,Th,Sa-HD  . gabapentin  300 mg Oral TID  . irbesartan  300 mg Oral QHS  . midodrine  10 mg Oral TID WC  . sevelamer carbonate  800 mg Oral QID     LOS: 3 days   Cherene Altes, MD [facility] Office  [phone] Pager - Text Page per Shea Evans  If 7PM-7AM, please contact night-coverage per Amion 10/01/2020, 2:36 PM

## 2020-10-01 NOTE — Progress Notes (Signed)
Physical Therapy Treatment Patient Details Name: Cassandra Joseph MRN: [id number] DOB: 1945-09-28 Today's Date: 10/01/2020    History of Present Illness 75 y.o. female with medical history significant for end-stage renal disease on hemodialysis M,W,F, HTN, gout, morbid obesity, multiple myeloma, rheumatoid arthritis seropositive, sleep apnea, hyperlipidemia, presents to the emergency department for chief concerns of left hip pain in unable to bear weight after a fall.  Left hip pain s/p fall, moderate size effusion and RA .    PT Comments    Patient received in bed, agrees to PT. Reports she is tired from HD today. Agreeable to bed mobility and exercises only. Performed exercises with min assist and mod assist for supine>< sit. Patient will continue to benefit from skilled PT while here to improve strength and functional independence.     Follow Up Recommendations  SNF;Supervision for mobility/OOB     Equipment Recommendations  None recommended by PT    Recommendations for Other Services       Precautions / Restrictions Precautions Precautions: Fall Restrictions Weight Bearing Restrictions: No    Mobility  Bed Mobility Overal bed mobility: Needs Assistance Bed Mobility: Supine to Sit;Sit to Supine     Supine to sit: Mod assist;HOB elevated Sit to supine: Mod assist;HOB elevated        Transfers                 General transfer comment: patient declines due to weakness from HD  Ambulation/Gait                 Stairs             Wheelchair Mobility    Modified Rankin (Stroke Patients Only)       Balance Overall balance assessment: Needs assistance Sitting-balance support: Bilateral upper extremity supported Sitting balance-Leahy Scale: Good                                      Cognition Arousal/Alertness: Awake/alert Behavior During Therapy: WFL for tasks assessed/performed Overall Cognitive Status: Within Functional  Limits for tasks assessed                                        Exercises Other Exercises Other Exercises: B LE strengthening : ap, heel slides, hip abd/add, SLR x 10 reps each with min assist as needed    General Comments        Pertinent Vitals/Pain Pain Assessment: No/denies pain    Home Living                      Prior Function            PT Goals (current goals can now be found in the care plan section) Acute Rehab PT Goals Patient Stated Goal: to move better PT Goal Formulation: With patient Time For Goal Achievement: 10/11/20 Potential to Achieve Goals: Fair Progress towards PT goals: Progressing toward goals    Frequency    Min 2X/week      PT Plan Current plan remains appropriate    Co-evaluation              AM-PAC PT "6 Clicks" Mobility   Outcome Measure  Help needed turning from your back to your side while in a flat bed without using  bedrails?: A Lot Help needed moving from lying on your back to sitting on the side of a flat bed without using bedrails?: A Lot Help needed moving to and from a bed to a chair (including a wheelchair)?: Total Help needed standing up from a chair using your arms (e.g., wheelchair or bedside chair)?: A Lot Help needed to walk in hospital room?: Total Help needed climbing 3-5 steps with a railing? : Total 6 Click Score: 9    End of Session Equipment Utilized During Treatment: Oxygen Activity Tolerance: Patient limited by fatigue Patient left: in bed;with call bell/phone within reach;with bed alarm set Nurse Communication: Mobility status PT Visit Diagnosis: History of falling (Z91.81);Difficulty in walking, not elsewhere classified (R26.2);Muscle weakness (generalized) (M62.81)     Time: 0856-9437 PT Time Calculation (min) (ACUTE ONLY): 18 min  Charges:  $Therapeutic Exercise: 8-22 mins                     Kristyn Conetta, PT, GCS 10/01/20,3:49 PM

## 2020-10-01 NOTE — Progress Notes (Signed)
[facility], [place] 10/01/20  Subjective:   LOS: 3  Patient known to our practice from outpatient dialysis.  She dialyzes at [facility] on Monday Wednesday and Friday shift. Last dialysis was on 14th February.  Patient's main reason for admission is pain in the left foot and hip.    Patient seen during dialysis   HEMODIALYSIS FLOWSHEET:  Blood Flow Rate (mL/min): 200 mL/min Arterial Pressure (mmHg): -80 mmHg Venous Pressure (mmHg): 70 mmHg Transmembrane Pressure (mmHg): 30 mmHg Ultrafiltration Rate (mL/min): 70 mL/min Dialysate Flow Rate (mL/min): 500 ml/min Conductivity: Machine : 13.9 Conductivity: Machine : 13.9 Dialysis Fluid Bolus: Normal Saline Bolus Amount (mL): 250 mL  Patient resting comfortable Able to eat meals without nausea Denies chest pain and shortness of breath Currently on room air    Objective:  Vital signs in last 24 hours:  Temp:  [98.2 F (36.8 C)-98.6 F (37 C)] 98.3 F (36.8 C) (02/24 1004) Pulse Rate:  [48-73] 54 (02/24 1300) Resp:  [15-25] 22 (02/24 1300) BP: (90-138)/(45-110) 113/56 (02/24 1300) SpO2:  [97 %-100 %] 100 % (02/24 1004)  Weight change:  Filed Weights   09/27/20 1228  Weight: 117.9 kg    Intake/Output:    Intake/Output Summary (Last 24 hours) at 10/01/2020 1358 Last data filed at 10/01/2020 1300 Gross per 24 hour  Intake 120 ml  Output 1500 ml  Net -1380 ml    Physical Exam: General:  No acute distress, laying in bed  HEENT  anicteric, moist oral mucous membrane  Pulm/lungs  normal breathing effort, lungs are clear  CVS/Heart  irregular rhythm, no rub or gallop  Abdomen:   Soft, nontender  Extremities:  + peripheral edema  Neurologic:  Alert, oriented, able to follow commands  Skin:  No acute rashes  Right arm AV fistula  Basic Metabolic Panel:  Recent Labs  Lab 09/27/20 1539 09/28/20 0528 09/29/20 0445 09/30/20 0510 10/01/20 0509  NA 135 136 133* 133* 133*  K 4.6  4.7 5.0 4.3 4.9  CL 94* 96* 94* 94* 94*  CO2 26 27 27 29 26   GLUCOSE 79 73 87 84 85  BUN 45* 51* 63* 34* 50*  CREATININE 7.22* 8.15* 9.30* 6.18* 7.74*  CALCIUM 12.0* 11.4* 10.7* 10.2 11.1*  MG  --   --  2.3 1.9  --   PHOS  --   --   --   --  7.1*     CBC: Recent Labs  Lab 09/27/20 1539 09/28/20 0528 09/29/20 0445 09/29/20 1119 09/30/20 0510  WBC 7.5 5.3 4.8 5.1 4.5  NEUTROABS 5.0  --   --   --   --   HGB 11.1* 9.6* 8.6* 9.3* 8.5*  HCT 36.7 29.4* 27.3* 28.9* 27.2*  MCV 101.7* 98.0 98.6 97.6 99.3  PLT 260 238 230 251 212      Lab Results  Component Value Date   HEPBSAG NON REACTIVE 09/29/2020   HEPBSAB Reactive 01/19/2017      Microbiology:  Recent Results (from the past 240 hour(s))  Resp Panel by RT-PCR (Flu A&B, Covid) Nasopharyngeal Swab     Status: None   Collection Time: 09/27/20  7:46 PM   Specimen: Nasopharyngeal Swab; Nasopharyngeal(NP) swabs in vial transport medium  Result Value Ref Range Status   SARS Coronavirus 2 by RT PCR NEGATIVE NEGATIVE Final    Comment: (NOTE) SARS-CoV-2 target nucleic acids are NOT DETECTED.  The SARS-CoV-2 RNA is generally detectable in upper respiratory specimens during the acute  phase of infection. The lowest concentration of SARS-CoV-2 viral copies this assay can detect is 138 copies/mL. A negative result does not preclude SARS-Cov-2 infection and should not be used as the sole basis for treatment or other patient management decisions. A negative result may occur with  improper specimen collection/handling, submission of specimen other than nasopharyngeal swab, presence of viral mutation(s) within the areas targeted by this assay, and inadequate number of viral copies(<138 copies/mL). A negative result must be combined with clinical observations, patient history, and epidemiological information. The expected result is Negative.  Fact Sheet for Patients:  EntrepreneurPulse.com.au  Fact Sheet for  Healthcare Providers:  IncredibleEmployment.be  This test is no t yet approved or cleared by the [place] FDA and  has been authorized for detection and/or diagnosis of SARS-CoV-2 by FDA under an Emergency Use Authorization (EUA). This EUA will remain  in effect (meaning this test can be used) for the duration of the COVID-19 declaration under Section 564(b)(1) of the Act, 21 U.S.C.section 360bbb-3(b)(1), unless the authorization is terminated  or revoked sooner.       Influenza A by PCR NEGATIVE NEGATIVE Final   Influenza B by PCR NEGATIVE NEGATIVE Final    Comment: (NOTE) The Xpert Xpress SARS-CoV-2/FLU/RSV plus assay is intended as an aid in the diagnosis of influenza from Nasopharyngeal swab specimens and should not be used as a sole basis for treatment. Nasal washings and aspirates are unacceptable for Xpert Xpress SARS-CoV-2/FLU/RSV testing.  Fact Sheet for Patients: EntrepreneurPulse.com.au  Fact Sheet for Healthcare Providers: IncredibleEmployment.be  This test is not yet approved or cleared by the [place] FDA and has been authorized for detection and/or diagnosis of SARS-CoV-2 by FDA under an Emergency Use Authorization (EUA). This EUA will remain in effect (meaning this test can be used) for the duration of the COVID-19 declaration under Section 564(b)(1) of the Act, 21 U.S.C. section 360bbb-3(b)(1), unless the authorization is terminated or revoked.  Performed at [facility], [address] [place], [place] [address]   Body fluid culture w Gram Stain     Status: None (Preliminary result)   Collection Time: 09/28/20  8:47 AM   Specimen: Pleura; Body Fluid  Result Value Ref Range Status   Specimen Description   Final    PLEURAL Performed at [facility], [address] [place], [place] [address]    Special Requests   Final    NONE Performed at [facility], [place], [place] [address]    Gram Stain NO WBC SEEN NO ORGANISMS SEEN   Final   Culture   Final    NO GROWTH 3 DAYS Performed at [facility], [place] [address], [place], [place] [address]    Report Status PENDING  Incomplete    Coagulation Studies: No results for input(s): LABPROT, INR in the last 72 hours.  Urinalysis: No results for input(s): COLORURINE, LABSPEC, PHURINE, GLUCOSEU, HGBUR, BILIRUBINUR, KETONESUR, PROTEINUR, UROBILINOGEN, NITRITE, LEUKOCYTESUR in the last 72 hours.  Invalid input(s): APPERANCEUR    Imaging: MR LUMBAR SPINE WO CONTRAST  Result Date: 09/30/2020 CLINICAL DATA:  Motor neuron disease. Progressive bilateral lower extremity weakness. EXAM: MRI LUMBAR SPINE WITHOUT CONTRAST TECHNIQUE: Multiplanar, multisequence MR imaging of the lumbar spine was performed. No intravenous contrast was administered. COMPARISON:  None. FINDINGS: Segmentation:  Standard. Alignment:  Mildly exaggerated lumbar lordosis. Vertebrae: Diffuse decrease of the T1 and T2 signal throughout the visualized spine, may be related to red marrow reconversion in  the setting of chronic anemia. No acute fracture or evidence of discitis. Conus medullaris and cauda equina: Conus extends to the L1 level. Conus and cauda equina appear normal. Paraspinal and other soft tissues: Atrophic and multi-cystic kidneys. Disc levels: T12-L1: No spinal canal or neural foraminal stenosis. L1-2: No spinal canal or neural foraminal stenosis. L2-3: Shallow disc bulge and mild facet degenerative changes without significant spinal canal or neural foraminal stenosis. L3-4: Shallow disc bulge and mild facet degenerative changes without significant spinal canal or neural foraminal stenosis. L4-5: Disc bulge, advanced facet degenerative changes and ligamentum flavum redundancy resulting in mild spinal canal stenosis and mild bilateral neural foraminal narrowing. L5-S1: Shallow disc bulge and moderate facet  degenerative changes resulting in mild narrowing of the bilateral subarticular zones and mild left neural foraminal narrowing. IMPRESSION: 1. Mild lumbar spondylosis as described above, with mild spinal canal stenosis and mild bilateral neural foraminal narrowing at L4-5. 2. Mild narrowing of the bilateral subarticular zones and left neural foraminal narrowing at L5-S1. 3. Diffuse decrease of the T1 and T2 signal throughout the visualized spine, may be related to red marrow reconversion in the setting of chronic anemia. Electronically Signed   By: Pedro Earls M.D.   On: 09/30/2020 14:20     Medications:    . apixaban  2.5 mg Oral BID  . atorvastatin  80 mg Oral Daily  . carvedilol  25 mg Oral BID WC  . Chlorhexidine Gluconate Cloth  6 each Topical Q0600  . cinacalcet  60 mg Oral BID WC  . epoetin (EPOGEN/PROCRIT) injection  4,000 Units Intravenous Q T,Th,Sa-HD  . gabapentin  300 mg Oral TID  . irbesartan  300 mg Oral QHS  . midodrine  10 mg Oral TID WC  . sevelamer carbonate  800 mg Oral QID   alum & mag hydroxide-simeth, dextromethorphan-guaiFENesin, hydrALAZINE, hydrocortisone, hydrocortisone cream, ipratropium-albuterol, lip balm, loratadine, metoprolol tartrate, morphine injection, Muscle Rub, ondansetron **OR** ondansetron (ZOFRAN) IV, phenol, polyvinyl alcohol, senna-docusate, sodium chloride  Assessment/ Plan:  75 y.o. female with end-stage renal disease, hypertension, gout, morbid obesity, multiple myeloma, rheumatoid arthritis, sleep apnea, hyperlipidemia paroxysmal atrial fibrillation, neuropathy multiple myeloma in remission, was admitted on 09/27/2020 for  Principal Problem:   Effusion of left hip Active Problems:   Morbid obesity ([place])   Essential hypertension   Multiple myeloma (HCC)   HLD (hyperlipidemia)   Rheumatoid arthritis with positive rheumatoid factor (HCC)  Septic arthritis (HCC) [M00.9] Left leg pain [M79.605] Effusion of hip joint, left  [M25.452] Effusion of left hip [M25.452]  [facility]([address] / TTS/11:00 am/AVF  #. ESRD Receiving dialysis today UF goal 1-1.5L Blood pressures stabilizing Carvedilol  Restarted midodrine today Next treatment scheduled for Saturday  #. Anemia of CKD  Lab Results  Component Value Date   HGB 8.5 (L) 09/30/2020   Low dose EPO 4000 units with HD Hematology evaluating for recurrence of myeloma. Bone marrow biopsy recommended  #. Secondary hyperparathyroidism of renal origin N 25.81      Component Value Date/Time   PTH 242 (H) 09/29/2020 1119   Lab Results  Component Value Date   PHOS 7.1 (H) 10/01/2020   Monitor calcium and phos level during this admission   #.  Left hip and foot pain Hospitalist concern for septic joint given elevated inflammatory markers and inability to ambulate.  Left hip joint aspiration negative for WBC and crystals. Pain management    LOS: 3 Colon Flattery 2/24/20221:58 PM  [facility]  [place], [place]   Patient was seen and evaluated with Colon Flattery, NP.  Plan of care was discussed with patient as well as NP.  I agree with the note as documented with edits.

## 2020-10-01 NOTE — Discharge Summary (Incomplete)
DISCHARGE SUMMARY  Cassandra Joseph  MR#: [id number]  DOB:23-Jan-1946  Date of Admission: 09/27/2020 Date of Discharge: 10/01/2020  Attending Physician:Jeffrey Hennie Duos, MD  Patient's HWT:UUEKC, Cherlyn Labella, MD  Consults: Nephrology Oncology  Disposition: D/C to SNF    Follow-up Appts:  Contact information for after-discharge care    [facility] .   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  CT of the hip at time of admission was negative for an acute fracture.  Aspiration revealed no evidence of crystals but some blood.  Further intervention was not required.  Ultimately physical therapy suggested SNF placement.  Incidentally during her hospital stay the patient was found to have a lytic lesion of the skull worrisome for recurrence of her MM.  Oncology is presently evaluating this issue.  Hospital Course:  Left hip posttraumatic effusion with intractable pain Joint effusion confirmed on CT with no evidence of fracture - joint aspirated 2/21 with blood appreciated but no evidence of infection or crystals - PT/OT recommend SNF and pt/family agree   Rheumatoid arthritis -impaired  ambulation/deconditioning PT/OT recommend SNF placement - medical team agrees this is most appropriate   Progressive bilateral lower extremity weakness MRI lumbar spine noted no severe canal stenosis or soft tissue masses - suspect this is progressive deconditioning and will require intensive PT/OT to recover  Suspected MM recurrence Lytic lesion noted in the left frontal bone - Oncology directed work-up - skeletal survey negative for abnormal lytic lesions - kappa/lambda light chain ratio slightly abnormal - outpatient PET scan to be pursued +/- outpt bone marrow biopsy   ESRD on HD - right upper extremity AV fistula Nephrology followed th/o hospital stay   Anemia of CKD Erythropoietin per Nephrology - Hgb stable at ~8.5 at time of d/c   HLD Continue atorvastatin  Paroxysmal atrial fibrillation Continue Eliquis and Coreg - HR well controlled during this admit   Essential HTN Blood pressure well controlled  Peripheral neuropathy Continue gabapentin  Sleep apnea Continue nightly CPAP   Allergies as of 10/01/2020      Reactions   Shellfish Allergy Shortness Of Breath    Med Rec must be completed prior to using this SMARTLINK***       Day of Discharge BP (!) 102/53 (BP Location: Left Arm)   Pulse (!) 50   Temp 98.4 F (36.9 C)   Resp 18   Ht 5' 6" (1.676 m)   Wt 117.9 kg   SpO2 100%   BMI 41.97 kg/m   Physical Exam: General: No acute respiratory distress Lungs: Clear to auscultation bilaterally without wheezes or crackles Cardiovascular: Regular rate and rhythm without murmur gallop or rub normal S1 and S2 Abdomen: Nontender, nondistended, soft, bowel sounds positive, no rebound, no ascites, no appreciable mass Extremities: No significant cyanosis, clubbing, or  edema bilateral lower extremities  Basic Metabolic Panel: Recent Labs  Lab 09/27/20 1539 09/28/20 0528 09/29/20 0445 09/30/20 0510 10/01/20 0509  NA 135 136 133* 133* 133*  K 4.6 4.7  5.0 4.3 4.9  CL 94* 96* 94* 94* 94*  CO2 _0 GLUCOSE 79 73 87 84 85  BUN 45* 51* 63* 34* 50*  CREATININE 7.22* 8.15* 9.30* 6.18* 7.74*  CALCIUM 12.0* 11.4* 10.7* 10.2 11.1*  MG  --   --  2.3 1.9  --   PHOS  --   --   --   --  7.1*    Liver Function Tests: Recent Labs  Lab 09/29/20 0445 09/30/20 0510 10/01/20 0509  AST 8* 9*  --   ALT 6 7  --   ALKPHOS 46 46  --   BILITOT 0.8 0.8  --   PROT 7.0 7.2  --   ALBUMIN 2.8* 2.7* 2.7*    CBC: Recent Labs  Lab 09/27/20 1539 09/28/20 0528 09/29/20 0445 09/29/20 1119 09/30/20 0510  WBC 7.5 5.3 4.8 5.1 4.5  NEUTROABS 5.0  --   --   --   --   HGB 11.1* 9.6* 8.6* 9.3* 8.5*  HCT 36.7 29.4* 27.3* 28.9* 27.2*  MCV 101.7* 98.0 98.6 97.6 99.3  PLT 260 238 230 251 212    CBG: Recent Labs  Lab 09/29/20 0752 09/29/20 1123 09/29/20 1644 09/30/20 0051  GLUCAP 88 102* 77 101*    Recent Results (from the past 240 hour(s))  Resp Panel by RT-PCR (Flu A&B, Covid) Nasopharyngeal Swab     Status: None   Collection Time: 09/27/20  7:46 PM   Specimen: Nasopharyngeal Swab; Nasopharyngeal(NP) swabs in vial transport medium  Result Value Ref Range Status   SARS Coronavirus 2 by RT PCR NEGATIVE NEGATIVE Final    Comment: (NOTE) SARS-CoV-2 target nucleic acids are NOT DETECTED.  The SARS-CoV-2 RNA is generally detectable in upper respiratory specimens during the acute phase of infection. The lowest concentration of SARS-CoV-2 viral copies this assay can detect is 138 copies/mL. A negative result does not preclude SARS-Cov-2 infection and should not be used as the sole basis for treatment or other patient management decisions. A negative result may occur with  improper specimen collection/handling, submission of specimen other than nasopharyngeal swab, presence of viral mutation(s) within the areas targeted by this assay, and inadequate number of viral copies(<138 copies/mL). A negative result must be combined with clinical  observations, patient history, and epidemiological information. The expected result is Negative.  Fact Sheet for Patients:  EntrepreneurPulse.com.au  Fact Sheet for Healthcare Providers:  IncredibleEmployment.be  This test is no t yet approved or cleared by the [place] FDA and  has been authorized for detection and/or diagnosis of SARS-CoV-2 by FDA under an Emergency Use Authorization (EUA). This EUA will remain  in effect (meaning this test can be used) for the duration of the COVID-19 declaration under Section 564(b)(1) of the Act, 21 U.S.C.section 360bbb-3(b)(1), unless the authorization is terminated  or revoked sooner.       Influenza A by PCR NEGATIVE NEGATIVE Final   Influenza B by PCR NEGATIVE NEGATIVE Final    Comment: (NOTE) The Xpert Xpress SARS-CoV-2/FLU/RSV plus assay is intended as an aid in the diagnosis of influenza from Nasopharyngeal swab specimens and should not be used as a sole basis for treatment. Nasal washings and aspirates are unacceptable for Xpert Xpress SARS-CoV-2/FLU/RSV testing.  Fact Sheet for Patients: EntrepreneurPulse.com.au  Fact Sheet for Healthcare Providers: https://www.fda.gov/media/152162/download  This test is not yet approved or cleared by the [place] FDA and has been authorized for detection and/or diagnosis of SARS-CoV-2 by FDA under an Emergency Use Authorization (EUA). This EUA will remain in effect (meaning this test can be used) for the duration of the COVID-19 declaration under Section 564(b)(1) of the Act, 21 U.S.C. section 360bbb-3(b)(1), unless the authorization is terminated or revoked.  Performed at [facility], [address] [place], [place] [address]   Body fluid culture w Gram Stain     Status: None (Preliminary result)   Collection Time: 09/28/20  8:47 AM   Specimen: Pleura; Body Fluid  Result Value Ref Range Status   Specimen  Description   Final    PLEURAL Performed at [facility], [address] [place], [place] [address]    Special Requests   Final    NONE Performed at [facility], [address] [place], [place] [address]    Gram Stain NO WBC SEEN NO ORGANISMS SEEN   Final   Culture   Final    NO GROWTH 2 DAYS Performed at [facility], [address] [place], [place] [address]    Report Status PENDING  Incomplete      Time spent in discharge (includes decision making & examination of pt): 35 minutes  10/01/2020, 7:56 AM   Jeffrey T. McClung, MD [facility] Office  [phone]      

## 2020-10-01 NOTE — Care Management Important Message (Signed)
Important Message  Patient Details  Name: Cassandra Joseph MRN: [id number] Date of Birth: 01-24-1946   Medicare Important Message Given:  Yes     [name] A Allmond 10/01/2020, 11:04 AM

## 2020-10-01 NOTE — Progress Notes (Signed)
Cassandra Joseph   DOB:Apr 24, 1946   VV#:[id number]    Subjective: Patient feels poorly.  She feels weak.  She has not been able to participate in physical therapy.  She feels weak in the legs.  Objective:  Vitals:   10/01/20 1230 10/01/20 1245  BP: 113/60 (!) 112/50  Pulse: (!) 52 (!) 52  Resp: 20 18  Temp:    SpO2:       Intake/Output Summary (Last 24 hours) at 10/01/2020 1253 Last data filed at 09/30/2020 1421 Gross per 24 hour  Intake 120 ml  Output --  Net 120 ml    Physical Exam Constitutional:      Comments: Patient is alone.  Resting in the bed.  HENT:     Head: Normocephalic and atraumatic.     Mouth/Throat:     Mouth: Oropharynx is clear and moist.     Pharynx: No oropharyngeal exudate.  Eyes:     Pupils: Pupils are equal, round, and reactive to light.  Cardiovascular:     Rate and Rhythm: Normal rate and regular rhythm.  Pulmonary:     Effort: No respiratory distress.     Breath sounds: No wheezing.     Comments: Decreased air entry bilaterally. Abdominal:     General: Bowel sounds are normal. There is no distension.     Palpations: Abdomen is soft. There is no mass.     Tenderness: There is no abdominal tenderness. There is no guarding or rebound.  Musculoskeletal:        General: No tenderness or edema. Normal range of motion.     Cervical back: Normal range of motion and neck supple.  Skin:    General: Skin is warm.  Neurological:     Mental Status: She is alert and oriented to person, place, and time.  Psychiatric:        Mood and Affect: Affect normal.      Labs:  Lab Results  Component Value Date   WBC 4.5 09/30/2020   HGB 8.5 (L) 09/30/2020   HCT 27.2 (L) 09/30/2020   MCV 99.3 09/30/2020   PLT 212 09/30/2020   NEUTROABS 5.0 09/27/2020    Lab Results  Component Value Date   NA 133 (L) 10/01/2020   K 4.9 10/01/2020   CL 94 (L) 10/01/2020   CO2 26 10/01/2020    Studies:  MR LUMBAR SPINE WO CONTRAST  Result Date: 09/30/2020 CLINICAL  DATA:  Motor neuron disease. Progressive bilateral lower extremity weakness. EXAM: MRI LUMBAR SPINE WITHOUT CONTRAST TECHNIQUE: Multiplanar, multisequence MR imaging of the lumbar spine was performed. No intravenous contrast was administered. COMPARISON:  None. FINDINGS: Segmentation:  Standard. Alignment:  Mildly exaggerated lumbar lordosis. Vertebrae: Diffuse decrease of the T1 and T2 signal throughout the visualized spine, may be related to red marrow reconversion in the setting of chronic anemia. No acute fracture or evidence of discitis. Conus medullaris and cauda equina: Conus extends to the L1 level. Conus and cauda equina appear normal. Paraspinal and other soft tissues: Atrophic and multi-cystic kidneys. Disc levels: T12-L1: No spinal canal or neural foraminal stenosis. L1-2: No spinal canal or neural foraminal stenosis. L2-3: Shallow disc bulge and mild facet degenerative changes without significant spinal canal or neural foraminal stenosis. L3-4: Shallow disc bulge and mild facet degenerative changes without significant spinal canal or neural foraminal stenosis. L4-5: Disc bulge, advanced facet degenerative changes and ligamentum flavum redundancy resulting in mild spinal canal stenosis and mild bilateral neural foraminal narrowing. L5-S1: Shallow  disc bulge and moderate facet degenerative changes resulting in mild narrowing of the bilateral subarticular zones and mild left neural foraminal narrowing. IMPRESSION: 1. Mild lumbar spondylosis as described above, with mild spinal canal stenosis and mild bilateral neural foraminal narrowing at L4-5. 2. Mild narrowing of the bilateral subarticular zones and left neural foraminal narrowing at L5-S1. 3. Diffuse decrease of the T1 and T2 signal throughout the visualized spine, may be related to red marrow reconversion in the setting of chronic anemia. Electronically Signed   By: Pedro Earls M.D.   On: 09/30/2020 14:20    Multiple myeloma  [facility]) #75 year old female patient with multiple medical problems including end-stage renal disease on dialysis; history of multiple myeloma-is currently admitted hospital for knee pain/effusion/incidentally noted to have lytic lesion in the skull.  #History of multiple myeloma-s/p Velcade dexamethasone treatment in 2007; given the lytic lesion noted on the skull-however skeletal survey negative for any abnormal lytic lesions.  Serum protein pending; kappa/lambda light chain ratio slightly abnormal.  Hypercalcemia etiology-unclear-await further work-up.  Given the persistent hypercalcemia would recommend bone marrow biopsy prior to discharge.  Patient will also need a PET scan with patient.  #Hypercalcemia calcium between 11 and 12-unclear etiology.  Discussed with Dr. Candiss Norse.  Recommend high-resolution CT for mediastinal adenopathy/sarcoidosis.  #End-stage renal disease/anemia [Dr.Singh]  #Joint effusion s/p aspiration question secondary to right arthritis.  Defer to primary team.  #Discussed with the patient; and her daughter [name].  They are in agreement.  Discussed with Dr.Singh & Dr.McClung.     Cassandra Sickle, MD 10/01/2020  12:53 PM

## 2020-10-01 NOTE — Progress Notes (Signed)
Patient returned from dialysis in stable condition.

## 2020-10-01 NOTE — Progress Notes (Signed)
Patient to dialysis in stable condition. Midodrine given prior to departure for soft b/p per order.

## 2020-10-02 ENCOUNTER — Inpatient Hospital Stay: Payer: Medicare Other

## 2020-10-02 DIAGNOSIS — M25452 Effusion, left hip: Secondary | ICD-10-CM | POA: Diagnosis not present

## 2020-10-02 LAB — CBC WITH DIFFERENTIAL/PLATELET
Abs Immature Granulocytes: 0.02 10*3/uL (ref 0.00–0.07)
Basophils Absolute: 0 10*3/uL (ref 0.0–0.1)
Basophils Relative: 1 %
Eosinophils Absolute: 0.2 10*3/uL (ref 0.0–0.5)
Eosinophils Relative: 4 %
HCT: 28.8 % — ABNORMAL LOW (ref 36.0–46.0)
Hemoglobin: 9.3 g/dL — ABNORMAL LOW (ref 12.0–15.0)
Immature Granulocytes: 0 %
Lymphocytes Relative: 23 %
Lymphs Abs: 1.3 10*3/uL (ref 0.7–4.0)
MCH: 31.7 pg (ref 26.0–34.0)
MCHC: 32.3 g/dL (ref 30.0–36.0)
MCV: 98.3 fL (ref 80.0–100.0)
Monocytes Absolute: 0.8 10*3/uL (ref 0.1–1.0)
Monocytes Relative: 14 %
Neutro Abs: 3.4 10*3/uL (ref 1.7–7.7)
Neutrophils Relative %: 58 %
Platelets: 235 10*3/uL (ref 150–400)
RBC: 2.93 MIL/uL — ABNORMAL LOW (ref 3.87–5.11)
RDW: 14.9 % (ref 11.5–15.5)
WBC: 5.8 10*3/uL (ref 4.0–10.5)
nRBC: 0 % (ref 0.0–0.2)

## 2020-10-02 LAB — RENAL FUNCTION PANEL
Albumin: 2.8 g/dL — ABNORMAL LOW (ref 3.5–5.0)
Anion gap: 9 (ref 5–15)
BUN: 38 mg/dL — ABNORMAL HIGH (ref 8–23)
CO2: 26 mmol/L (ref 22–32)
Calcium: 11.4 mg/dL — ABNORMAL HIGH (ref 8.9–10.3)
Chloride: 98 mmol/L (ref 98–111)
Creatinine, Ser: 6.04 mg/dL — ABNORMAL HIGH (ref 0.44–1.00)
GFR, Estimated: 7 mL/min — ABNORMAL LOW (ref >60)
Glucose, Bld: 85 mg/dL (ref 70–99)
Phosphorus: 6.4 mg/dL — ABNORMAL HIGH (ref 2.5–4.6)
Potassium: 4.3 mmol/L (ref 3.5–5.1)
Sodium: 133 mmol/L — ABNORMAL LOW (ref 135–145)

## 2020-10-02 LAB — BODY FLUID CULTURE W GRAM STAIN
Culture: NO GROWTH
Gram Stain: NONE SEEN

## 2020-10-02 LAB — VITAMIN D 25 HYDROXY (VIT D DEFICIENCY, FRACTURES): Vit D, 25-Hydroxy: 22.66 ng/mL — ABNORMAL LOW (ref 30–100)

## 2020-10-02 MED ORDER — HEPARIN SOD (PORK) LOCK FLUSH 100 UNIT/ML IV SOLN
INTRAVENOUS | Status: AC
  Filled 2020-10-02: qty 5

## 2020-10-02 MED ORDER — SODIUM CHLORIDE 0.9 % IV SOLN
INTRAVENOUS | Status: AC | PRN
  Administered 2020-10-02: 10 mL/h via INTRAVENOUS

## 2020-10-02 MED ORDER — MIDAZOLAM HCL 2 MG/2ML IJ SOLN
INTRAMUSCULAR | Status: AC | PRN
  Administered 2020-10-02: 0.5 mg via INTRAVENOUS

## 2020-10-02 MED ORDER — CARVEDILOL 6.25 MG PO TABS: 12.5000 mg | ORAL_TABLET | Freq: Two times a day (BID) | ORAL | Status: DC

## 2020-10-02 MED ORDER — MIDAZOLAM HCL 2 MG/2ML IJ SOLN
INTRAMUSCULAR | Status: AC
  Filled 2020-10-02: qty 2

## 2020-10-02 MED ORDER — FENTANYL CITRATE (PF) 100 MCG/2ML IJ SOLN
INTRAMUSCULAR | Status: AC
  Filled 2020-10-02: qty 2

## 2020-10-02 MED ORDER — FENTANYL CITRATE (PF) 100 MCG/2ML IJ SOLN
INTRAMUSCULAR | Status: AC | PRN
  Administered 2020-10-02: 25 ug via INTRAVENOUS

## 2020-10-02 MED ORDER — CARVEDILOL 6.25 MG PO TABS
12.5000 mg | ORAL_TABLET | Freq: Two times a day (BID) | ORAL | Status: DC
  Administered 2020-10-02 – 2020-10-05 (×4): 12.5 mg via ORAL
  Filled 2020-10-02 (×4): qty 2

## 2020-10-02 NOTE — Plan of Care (Signed)
  Problem: Education: Goal: Knowledge of General Education information will improve Description: Including pain rating scale, medication(s)/side effects and non-pharmacologic comfort measures Outcome: Progressing   Problem: Clinical Measurements: Goal: Ability to maintain clinical measurements within normal limits will improve Outcome: Progressing Goal: Respiratory complications will improve Outcome: Progressing Goal: Cardiovascular complication will be avoided Outcome: Progressing   Problem: Activity: Goal: Risk for activity intolerance will decrease Outcome: Progressing   Problem: Nutrition: Goal: Adequate nutrition will be maintained Outcome: Progressing   Problem: Coping: Goal: Level of anxiety will decrease Outcome: Progressing   Problem: Elimination: Goal: Will not experience complications related to urinary retention Outcome: Progressing   Problem: Pain Managment: Goal: General experience of comfort will improve Outcome: Progressing   Problem: Safety: Goal: Ability to remain free from injury will improve Outcome: Progressing

## 2020-10-02 NOTE — Progress Notes (Signed)
Physical Therapy Treatment Patient Details Name: Cassandra Joseph MRN: [id number] DOB: [birth year] Today's Date: 10/02/2020    History of Present Illness 75 y.o. female with medical history significant for end-stage renal disease on hemodialysis M,W,F, HTN, gout, morbid obesity, multiple myeloma, rheumatoid arthritis seropositive, sleep apnea, hyperlipidemia, presents to the emergency department for chief concerns of left hip pain in unable to bear weight after a fall.  Left hip pain s/p fall, moderate size effusion and RA .    PT Comments    Patient reports she is very tired. Looks lethargic. Had bone biopsy this morning and she was knocked out for that. Patient unable to tolerate much activity this pm. Bed level exercises only at this time. Patient will continue to benefit from skilled PT while here to improve functional independence and strength.   Follow Up Recommendations  SNF;Supervision for mobility/OOB     Equipment Recommendations  None recommended by PT    Recommendations for Other Services       Precautions / Restrictions Precautions Precautions: Fall Restrictions Weight Bearing Restrictions: No    Mobility  Bed Mobility               General bed mobility comments: not attempted, declines due to lethargy this pm    Transfers                 General transfer comment: declines due to [facility] this pm  Ambulation/Gait                 Stairs             Wheelchair Mobility    Modified Rankin (Stroke Patients Only)       Balance                                            Cognition Arousal/Alertness: Lethargic Behavior During Therapy: WFL for tasks assessed/performed Overall Cognitive Status: Within Functional Limits for tasks assessed                                        Exercises Other Exercises Other Exercises: B LE strengthening: AP, heel sides, hip abd/add x 10 reps with min assist  due to lethargy    General Comments        Pertinent Vitals/Pain Pain Assessment: Faces Faces Pain Scale: Hurts a little bit Pain Location: bottom    Home Living                      Prior Function            PT Goals (current goals can now be found in the care plan section) Acute Rehab PT Goals Patient Stated Goal: to move better PT Goal Formulation: With patient Time For Goal Achievement: 10/11/20 Potential to Achieve Goals: Fair Progress towards PT goals: Progressing toward goals    Frequency    Min 2X/week      PT Plan Current plan remains appropriate    Co-evaluation              AM-PAC PT "6 Clicks" Mobility   Outcome Measure  Help needed turning from your back to your side while in a flat bed without using bedrails?: A Lot Help needed moving from lying  on your back to sitting on the side of a flat bed without using bedrails?: A Lot Help needed moving to and from a bed to a chair (including a wheelchair)?: Total Help needed standing up from a chair using your arms (e.g., wheelchair or bedside chair)?: A Lot Help needed to walk in hospital room?: Total Help needed climbing 3-5 steps with a railing? : Total 6 Click Score: 9    End of Session Equipment Utilized During Treatment: Oxygen Activity Tolerance: Patient limited by fatigue;Patient limited by lethargy Patient left: in bed;with call bell/phone within reach;with bed alarm set Nurse Communication: Mobility status PT Visit Diagnosis: History of falling (Z91.81);Difficulty in walking, not elsewhere classified (R26.2);Muscle weakness (generalized) (M62.81) Pain - part of body:  (bottom)     Time: 5176-1607 PT Time Calculation (min) (ACUTE ONLY): 8 min  Charges:  $Therapeutic Exercise: 8-22 mins                     Kristyn Conetta, PT, GCS 10/02/20,2:31 PM

## 2020-10-02 NOTE — Progress Notes (Signed)
[place], [place] 10/02/20  Subjective:   LOS: 4  Patient known to our practice from outpatient dialysis.  She dialyzes at [facility] on Monday Wednesday and Friday shift. Last dialysis was on 14th February.  Patient's main reason for admission is pain in the left foot and hip.    Patient resting comfortable after procedure Alert and oriented Able to eat meals without nausea Denies chest pain and shortness of breath Currently on room air    Objective:  Vital signs in last 24 hours:  Temp:  [97.6 F (36.4 C)-98.5 F (36.9 C)] 97.7 F (36.5 C) (02/25 1235) Pulse Rate:  [50-75] 73 (02/25 1235) Resp:  [16-24] 18 (02/25 1235) BP: (112-157)/(47-110) 130/69 (02/25 1235) SpO2:  [92 %-100 %] 99 % (02/25 1235)  Weight change:  Filed Weights   09/27/20 1228  Weight: 117.9 kg    Intake/Output:    Intake/Output Summary (Last 24 hours) at 10/02/2020 1236 Last data filed at 10/01/2020 1300 Gross per 24 hour  Intake -  Output 1500 ml  Net -1500 ml    Physical Exam: General:  No acute distress, laying in bed  HEENT  anicteric, moist oral mucous membrane  Pulm/lungs  normal breathing effort, lungs are clear  CVS/Heart  irregular rhythm, no rub or gallop  Abdomen:   Soft, nontender  Extremities:  + peripheral edema  Neurologic:  Alert, oriented, able to follow commands  Skin:  No acute rashes  Right arm AV fistula  Basic Metabolic Panel:  Recent Labs  Lab 09/28/20 0528 09/29/20 0445 09/30/20 0510 10/01/20 0509 10/02/20 0506  NA 136 133* 133* 133* 133*  K 4.7 5.0 4.3 4.9 4.3  CL 96* 94* 94* 94* 98  CO2 27 27 29 26 26   GLUCOSE 73 87 84 85 85  BUN 51* 63* 34* 50* 38*  CREATININE 8.15* 9.30* 6.18* 7.74* 6.04*  CALCIUM 11.4* 10.7* 10.2 11.1* 11.4*  MG  --  2.3 1.9  --   --   PHOS  --   --   --  7.1* 6.4*     CBC: Recent Labs  Lab 09/27/20 1539 09/28/20 0528 09/29/20 0445 09/29/20 1119 09/30/20 0510 10/02/20 0506  WBC  7.5 5.3 4.8 5.1 4.5 5.8  NEUTROABS 5.0  --   --   --   --  3.4  HGB 11.1* 9.6* 8.6* 9.3* 8.5* 9.3*  HCT 36.7 29.4* 27.3* 28.9* 27.2* 28.8*  MCV 101.7* 98.0 98.6 97.6 99.3 98.3  PLT 260 238 230 251 212 235      Lab Results  Component Value Date   HEPBSAG NON REACTIVE 09/29/2020   HEPBSAB Reactive 01/19/2017      Microbiology:  Recent Results (from the past 240 hour(s))  Resp Panel by RT-PCR (Flu A&B, Covid) Nasopharyngeal Swab     Status: None   Collection Time: 09/27/20  7:46 PM   Specimen: Nasopharyngeal Swab; Nasopharyngeal(NP) swabs in vial transport medium  Result Value Ref Range Status   SARS Coronavirus 2 by RT PCR NEGATIVE NEGATIVE Final    Comment: (NOTE) SARS-CoV-2 target nucleic acids are NOT DETECTED.  The SARS-CoV-2 RNA is generally detectable in upper respiratory specimens during the acute phase of infection. The lowest concentration of SARS-CoV-2 viral copies this assay can detect is 138 copies/mL. A negative result does not preclude SARS-Cov-2 infection and should not be used as the sole basis for treatment or other patient management decisions. A negative result may occur with  improper specimen  collection/handling, submission of specimen other than nasopharyngeal swab, presence of viral mutation(s) within the areas targeted by this assay, and inadequate number of viral copies(<138 copies/mL). A negative result must be combined with clinical observations, patient history, and epidemiological information. The expected result is Negative.  Fact Sheet for Patients:  EntrepreneurPulse.com.au  Fact Sheet for Healthcare Providers:  IncredibleEmployment.be  This test is no t yet approved or cleared by the [place] FDA and  has been authorized for detection and/or diagnosis of SARS-CoV-2 by FDA under an Emergency Use Authorization (EUA). This EUA will remain  in effect (meaning this test can be used) for the duration of  the COVID-19 declaration under Section 564(b)(1) of the Act, 21 U.S.C.section 360bbb-3(b)(1), unless the authorization is terminated  or revoked sooner.       Influenza A by PCR NEGATIVE NEGATIVE Final   Influenza B by PCR NEGATIVE NEGATIVE Final    Comment: (NOTE) The Xpert Xpress SARS-CoV-2/FLU/RSV plus assay is intended as an aid in the diagnosis of influenza from Nasopharyngeal swab specimens and should not be used as a sole basis for treatment. Nasal washings and aspirates are unacceptable for Xpert Xpress SARS-CoV-2/FLU/RSV testing.  Fact Sheet for Patients: EntrepreneurPulse.com.au  Fact Sheet for Healthcare Providers: IncredibleEmployment.be  This test is not yet approved or cleared by the [place] FDA and has been authorized for detection and/or diagnosis of SARS-CoV-2 by FDA under an Emergency Use Authorization (EUA). This EUA will remain in effect (meaning this test can be used) for the duration of the COVID-19 declaration under Section 564(b)(1) of the Act, 21 U.S.C. section 360bbb-3(b)(1), unless the authorization is terminated or revoked.  Performed at [facility], [address] [place], [place] [address]   Body fluid culture w Gram Stain     Status: None   Collection Time: 09/28/20  8:47 AM   Specimen: Pleura; Body Fluid  Result Value Ref Range Status   Specimen Description   Final    PLEURAL Performed at [facility], [address] [place], [place] [address]    Special Requests   Final    NONE Performed at [facility], [address] [place], [place] [address]    Gram Stain NO WBC SEEN NO ORGANISMS SEEN   Final   Culture   Final    NO GROWTH Performed at [facility], [place] [address] [place],  [address]    Report Status 10/02/2020 FINAL  Final    Coagulation Studies: No results for input(s): LABPROT, INR in the last 72 hours.  Urinalysis: No results for  input(s): COLORURINE, LABSPEC, PHURINE, GLUCOSEU, HGBUR, BILIRUBINUR, KETONESUR, PROTEINUR, UROBILINOGEN, NITRITE, LEUKOCYTESUR in the last 72 hours.  Invalid input(s): APPERANCEUR    Imaging: MR LUMBAR SPINE WO CONTRAST  Result Date: 09/30/2020 CLINICAL DATA:  Motor neuron disease. Progressive bilateral lower extremity weakness. EXAM: MRI LUMBAR SPINE WITHOUT CONTRAST TECHNIQUE: Multiplanar, multisequence MR imaging of the lumbar spine was performed. No intravenous contrast was administered. COMPARISON:  None. FINDINGS: Segmentation:  Standard. Alignment:  Mildly exaggerated lumbar lordosis. Vertebrae: Diffuse decrease of the T1 and T2 signal throughout the visualized spine, may be related to red marrow reconversion in the setting of chronic anemia. No acute fracture or evidence of discitis. Conus medullaris and cauda equina: Conus extends to the L1 level. Conus and cauda equina appear normal. Paraspinal and other soft tissues: Atrophic and multi-cystic kidneys. Disc levels: T12-L1: No spinal canal or neural foraminal stenosis. L1-2: No spinal canal or  neural foraminal stenosis. L2-3: Shallow disc bulge and mild facet degenerative changes without significant spinal canal or neural foraminal stenosis. L3-4: Shallow disc bulge and mild facet degenerative changes without significant spinal canal or neural foraminal stenosis. L4-5: Disc bulge, advanced facet degenerative changes and ligamentum flavum redundancy resulting in mild spinal canal stenosis and mild bilateral neural foraminal narrowing. L5-S1: Shallow disc bulge and moderate facet degenerative changes resulting in mild narrowing of the bilateral subarticular zones and mild left neural foraminal narrowing. IMPRESSION: 1. Mild lumbar spondylosis as described above, with mild spinal canal stenosis and mild bilateral neural foraminal narrowing at L4-5. 2. Mild narrowing of the bilateral subarticular zones and left neural foraminal narrowing at L5-S1. 3.  Diffuse decrease of the T1 and T2 signal throughout the visualized spine, may be related to red marrow reconversion in the setting of chronic anemia. Electronically Signed   By: Pedro Earls M.D.   On: 09/30/2020 14:20     Medications:    . apixaban  2.5 mg Oral BID  . atorvastatin  80 mg Oral Daily  . carvedilol  12.5 mg Oral BID WC  . Chlorhexidine Gluconate Cloth  6 each Topical Q0600  . cinacalcet  60 mg Oral BID WC  . epoetin (EPOGEN/PROCRIT) injection  4,000 Units Intravenous Q T,Th,Sa-HD  . fentaNYL      . gabapentin  300 mg Oral TID  . heparin lock flush      . midazolam      . midodrine  10 mg Oral TID WC  . sevelamer carbonate  800 mg Oral QID   alum & mag hydroxide-simeth, dextromethorphan-guaiFENesin, hydrALAZINE, hydrocortisone, hydrocortisone cream, ipratropium-albuterol, lip balm, loratadine, morphine injection, Muscle Rub, ondansetron **OR** ondansetron (ZOFRAN) IV, phenol, polyvinyl alcohol, senna-docusate, sodium chloride  Assessment/ Plan:  75 y.o. female with end-stage renal disease, hypertension, gout, morbid obesity, multiple myeloma, rheumatoid arthritis, sleep apnea, hyperlipidemia paroxysmal atrial fibrillation, neuropathy multiple myeloma in remission, was admitted on 09/27/2020 for  Principal Problem:   Effusion of left hip Active Problems:   Morbid obesity ([place])   Essential hypertension   Multiple myeloma (HCC)   HLD (hyperlipidemia)   Rheumatoid arthritis with positive rheumatoid factor (HCC)  Septic arthritis (HCC) [M00.9] Left leg pain [M79.605] Effusion of hip joint, left [M25.452] Effusion of left hip [M25.452]  [facility] TTS/11:00 am/AVF  #. ESRD Receiving dialysis yesterday Removed 1.5L of fluid Blood pressures stable Carvedilol an Midodrine Next treatment scheduled for Saturday  #. Anemia of CKD  Lab Results  Component Value Date   HGB 9.3 (L) 10/02/2020   Low dose EPO 4000 units with  HD Hematology evaluating for recurrence of myeloma. Bone marrow biopsy performed this morning  #. Secondary hyperparathyroidism of renal origin N 25.81      Component Value Date/Time   PTH 242 (H) 09/29/2020 1119   Lab Results  Component Value Date   PHOS 6.4 (H) 10/02/2020   Monitor calcium and phos level during this admission   #.  Left hip and foot pain Hospitalist concern for septic joint given elevated inflammatory markers and inability to ambulate.  Left hip joint aspiration negative for WBC and crystals. Pain management    LOS: [place] 2/25/202212:36 PM  [place], [place]   Patient was seen and evaluated with Colon Flattery, NP.  Plan of care was discussed with patient as well as NP.  I agree with the note as documented with edits.

## 2020-10-02 NOTE — Procedures (Signed)
Interventional Radiology Procedure Note  Procedure: CT guided aspirate and core biopsy of right iliac bone  Complications: None  Recommendations: - Bedrest supine x 1 hrs - Follow biopsy results   Autrey Human, MD   

## 2020-10-02 NOTE — Progress Notes (Signed)
Chief Complaint: Patient was seen in consultation today for bone marrow biopsy  Referring Physician(s): Dr. Rogue Bussing  Supervising Physician: Ruthann Cancer  Patient Status: [place] - In-pt  History of Present Illness: Cassandra Joseph is a 75 y.o. female with multiple medical issues admitted with fatigue and left hip pain. She also has a hx of myeloma and is found to have persistent hypercalcemia. IR is asked to perform bone marrow biopsy while she is inpt. PMHx, meds, labs, imaging, allergies reviewed. Feels okay this am. Has been NPO today as directed.    Past Medical History:  Diagnosis Date  . 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 CHF (congestive heart failure) ([place])   . ESRD (end stage renal disease) ([place])    a. on HD x 11 years; b. felt to be 2/2 HTN; c. HD TTS  . History of nuclear stress test    a. 08/2015: probably normal myocardial perfusion study, no evidence for significant ischemia or scar was noted, during stress, global systolic function normal, EF 54%, RV borderline dilatation, coronary artery calcifications and marked mitral annular calcifications were noted, liver calcifications were noted on attenuation CT scan likely represent sequelae of previous granulomatous disease  . HLD (hyperlipidemia)   . Hypertension   . Ischemic stroke ([place])    a. 03/2014  . Metabolic syndrome   . Morbid obesity ([place])   . Multiple myeloma ([facility])    a. in remission  . Osteomyelitis ([place])    a. 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 Rheumatoid arthritis ([place])   . TIA (transient ischemic attack)    a. 2014    Past Surgical History:  Procedure Laterality Date  . ABDOMINAL HYSTERECTOMY    . APPENDECTOMY    . eyelid surgery    . HERNIA REPAIR    . right knee surgery      Allergies: Shellfish allergy  Medications: Current Outpatient Medications  Medication Instructions  . apixaban (ELIQUIS) 2.5 mg, Oral, 2 times daily  . aspirin EC 81 mg, Oral, Daily  .  atorvastatin (LIPITOR) 80 mg, Oral, Daily  . b complex-vitamin c-folic acid (NEPHRO-VITE) 0.8 MG TABS tablet 1 tablet, Oral, Daily  . carvedilol (COREG) 25 mg, Oral, 2 times daily  . cinacalcet (SENSIPAR) 60 mg, Oral, 2 times daily  . gabapentin (NEURONTIN) 300 mg, Oral, 3 times daily  . hydroxychloroquine (PLAQUENIL) 200 mg, Oral, Daily  . midodrine (PROAMATINE) 10 mg, Oral, 3 times daily with meals  . sevelamer carbonate (RENVELA) 800 mg, Oral, 4 times daily  . torsemide (DEMADEX) 60 mg, Oral, Daily      Family History  Problem Relation Age of Onset  . Seizures Mother   . Hypertension Mother   . Throat cancer Father   . Breast cancer Neg Hx     Social History   Socioeconomic History  . Marital status: Married    Spouse name: Not on file  . Number of children: Not on file  . Years of education: Not on file  . Highest education level: Not on file  Occupational History  . Not on file  Tobacco Use  . Smoking status: Former Research scientist (life sciences)  . Smokeless tobacco: Never Used  . Tobacco comment: quit in 2007  Substance and Sexual Activity  . Alcohol use: No  . Drug use: No  . Sexual activity: Not Currently  Other Topics Concern  . Not on file  Social History Narrative   Independent at baseline, ambulates  with a walker   Social Determinants of Health   Financial Resource Strain: Not on file  Food Insecurity: Not on file  Transportation Needs: Not on file  Physical Activity: Not on file  Stress: Not on file  Social Connections: Not on file     Review of Systems: A 12 point ROS discussed and pertinent positives are indicated in the HPI above.  All other systems are negative.  Review of Systems  Vital Signs: BP (!) 121/47 (BP Location: Left Arm)   Pulse (!) 50   Temp 98.5 F (36.9 C)   Resp 18   Ht 5' 6" (1.676 m)   Wt 117.9 kg   SpO2 99%   BMI 41.97 kg/m   Physical Exam Constitutional:      Appearance: She is not ill-appearing.  HENT:     Mouth/Throat:      Mouth: Mucous membranes are moist.     Pharynx: Oropharynx is clear.  Cardiovascular:     Rate and Rhythm: Normal rate and regular rhythm.     Heart sounds: Normal heart sounds.  Pulmonary:     Effort: Pulmonary effort is normal. No respiratory distress.     Breath sounds: Normal breath sounds.  Skin:    General: Skin is warm and dry.  Neurological:     General: No focal deficit present.     Mental Status: She is alert and oriented to person, place, and time.  Psychiatric:        Mood and Affect: Mood normal.        Thought Content: Thought content normal.        Judgment: Judgment normal.       Imaging: CT Head Wo Contrast  Result Date: 09/27/2020 CLINICAL DATA:  Head trauma.  Pain. EXAM: CT HEAD WITHOUT CONTRAST TECHNIQUE: Contiguous axial images were obtained from the base of the skull through the vertex without intravenous contrast. COMPARISON:  March 26, 2014 FINDINGS: Brain: No subdural, epidural, or subarachnoid hemorrhage identified. Ventricles and sulci are stable. Mild white matter changes have increased since 2015. No acute cortical ischemia or infarct. Cerebellum, brainstem, and basal cisterns are normal. No mass effect or midline shift. Vascular: No hyperdense vessel or unexpected calcification. Skull: There is a lytic lesion associated with the left frontal bone on series 2, image 45, new since 2015. No other bony lesions are identified. Sinuses/Orbits: Paranasal sinuses are normal. There is opacification of scattered mastoid air cells without bony erosion. The middle ears are well aerated. Other: None. IMPRESSION: 1. No acute intracranial abnormality. 2. Lytic lesion in the left frontal bone as above. This is new since 29 but otherwise age indeterminate. This lytic lesion could be due to the patient's history of multiple myeloma. Electronically Signed   By: Dorise Bullion III M.D   On: 09/27/2020 15:11   MR LUMBAR SPINE WO CONTRAST  Result Date: 09/30/2020 CLINICAL  DATA:  Motor neuron disease. Progressive bilateral lower extremity weakness. EXAM: MRI LUMBAR SPINE WITHOUT CONTRAST TECHNIQUE: Multiplanar, multisequence MR imaging of the lumbar spine was performed. No intravenous contrast was administered. COMPARISON:  None. FINDINGS: Segmentation:  Standard. Alignment:  Mildly exaggerated lumbar lordosis. Vertebrae: Diffuse decrease of the T1 and T2 signal throughout the visualized spine, may be related to red marrow reconversion in the setting of chronic anemia. No acute fracture or evidence of discitis. Conus medullaris and cauda equina: Conus extends to the L1 level. Conus and cauda equina appear normal. Paraspinal and other soft tissues: Atrophic and  multi-cystic kidneys. Disc levels: T12-L1: No spinal canal or neural foraminal stenosis. L1-2: No spinal canal or neural foraminal stenosis. L2-3: Shallow disc bulge and mild facet degenerative changes without significant spinal canal or neural foraminal stenosis. L3-4: Shallow disc bulge and mild facet degenerative changes without significant spinal canal or neural foraminal stenosis. L4-5: Disc bulge, advanced facet degenerative changes and ligamentum flavum redundancy resulting in mild spinal canal stenosis and mild bilateral neural foraminal narrowing. L5-S1: Shallow disc bulge and moderate facet degenerative changes resulting in mild narrowing of the bilateral subarticular zones and mild left neural foraminal narrowing. IMPRESSION: 1. Mild lumbar spondylosis as described above, with mild spinal canal stenosis and mild bilateral neural foraminal narrowing at L4-5. 2. Mild narrowing of the bilateral subarticular zones and left neural foraminal narrowing at L5-S1. 3. Diffuse decrease of the T1 and T2 signal throughout the visualized spine, may be related to red marrow reconversion in the setting of chronic anemia. Electronically Signed   By: Pedro Earls M.D.   On: 09/30/2020 14:20   CT Hip Left Wo  Contrast  Result Date: 09/27/2020 CLINICAL DATA:  Left hip pain since a fall 09/18/2020. The patient is unable to bear weight. EXAM: CT OF THE LEFT HIP WITHOUT CONTRAST TECHNIQUE: Multidetector CT imaging of the left hip was performed according to the standard protocol. Multiplanar CT image reconstructions were also generated. COMPARISON:  Plain films left hip earlier today. CT abdomen and pelvis 07/13/2005 FINDINGS: Bones/Joint/Cartilage No fracture, dislocation or worrisome lesion is identified. There is some degenerative disease about the right hip with a subchondral cyst seen in the acetabulum. Small to moderate left hip joint effusion is present. Erosive change is seen about both SI joints. Ligaments Suboptimally assessed by CT. Muscles and Tendons Appear intact. Soft tissues Fat containing left inguinal hernia is seen. Sigmoid diverticulosis noted. IMPRESSION: Negative for fracture. Small to moderate left hip joint effusion is nonspecific and could be due to degenerative disease or synovitis. Erosive change about the SI joints new since 2006. Differential considerations include spondyloarthropathy or metabolic process such as secondary hyperparathyroidism. Electronically Signed   By: Inge Rise M.D.   On: 09/27/2020 15:13   US Venous Img Lower Unilateral Left  Result Date: 09/27/2020 CLINICAL DATA:  Left leg swelling. EXAM: LEFT LOWER EXTREMITY VENOUS DOPPLER ULTRASOUND TECHNIQUE: Gray-scale sonography with compression, as well as color and duplex ultrasound, were performed to evaluate the deep venous system(s) from the level of the common femoral vein through the popliteal and proximal calf veins. COMPARISON:  None. FINDINGS: VENOUS Normal compressibility of the common femoral, superficial femoral, and popliteal veins, as well as the visualized calf veins. Visualized portions of profunda femoral vein and great saphenous vein unremarkable. No filling defects to suggest DVT on grayscale or color  Doppler imaging. Doppler waveforms show normal direction of venous flow, normal respiratory plasticity and response to augmentation. Limited views of the contralateral common femoral vein are unremarkable. OTHER None. Limitations: None. IMPRESSION: Negative exam. Electronically Signed   By: Inge Rise M.D.   On: 09/27/2020 13:35   DG Knee Complete 4 Views Left  Result Date: 09/27/2020 CLINICAL DATA:  Worsening left knee pain since a fall 09/18/2020. Initial encounter. EXAM: LEFT KNEE - COMPLETE 4+ VIEW COMPARISON:  None. FINDINGS: No acute bony or joint abnormality is identified. Osteoarthritis about the knee is worst in the medial compartment. Small joint effusion noted. IMPRESSION: No acute abnormality. 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 Electronically Signed  By: Inge Rise M.D.   On: 09/27/2020 14:00   DG Bone Survey Met  Result Date: 09/29/2020 CLINICAL DATA:  Multiple myeloma. EXAM: METASTATIC BONE SURVEY COMPARISON:  August 27, 2008.  August 07, 2020. FINDINGS: Mild cardiomegaly is noted. No definite lytic lesion is seen. Increased sclerosis of visualized skeleton is noted consistent with history of end-stage renal disease. IMPRESSION: No definite lytic lesion is seen. Increased sclerosis of visualized skeleton consistent with history of end-stage renal disease. Electronically Signed   By: Marijo Conception M.D.   On: 09/29/2020 11:28   DG Foot Complete Left  Result Date: 09/27/2020 CLINICAL DATA:  Worsening left foot pain since a fall 09/18/2020. Initial encounter. EXAM: LEFT FOOT - COMPLETE 3+ VIEW COMPARISON:  None. FINDINGS: No acute bony or joint abnormality. The patient is status post amputation of the fifth toe. Mild midfoot osteoarthritis is seen. Extensive vascular calcifications. IMPRESSION: No acute finding. Electronically Signed   By: Inge Rise M.D.   On: 09/27/2020 14:02   DG FLUORO GUIDED NEEDLE PLC ASPIRATION/INJECTION  LOC  Result Date: 09/28/2020 CLINICAL DATA:  Left hip joint effusion EXAM: LEFT HIP ASPIRATION UNDER FLUOROSCOPY COMPARISON:  None. FLUOROSCOPY TIME:  Fluoroscopy Time:  0.1 minute Radiation Exposure Index (if provided by the fluoroscopic device): 0.6 mGy Number of Acquired Spot Images: 0 PROCEDURE: Overlying skin prepped with Betadine, draped in the usual sterile fashion, and infiltrated locally with buffered Lidocaine. Curved 20 gauge spinal needle advanced to the superolateral margin of the left femoral head-neck junction. Diagnostic injection of iodinated contrast demonstrates intra-articular spread without intravascular component. 5 mL of serosanguineous synovial fluid was aspirated from the left hip joint. Needle was removed. No immediate complications. Patient demonstrated no discomfort. IMPRESSION: Technically successful left hip aspiration under fluoroscopy. Electronically Signed   By: Kathreen Devoid   On: 09/28/2020 16:36   DG Hip Unilat W or Wo Pelvis 2-3 Views Left  Addendum Date: 09/27/2020   ADDENDUM REPORT: 09/27/2020 15:09 ADDENDUM: This addendum is given for the purpose of noting the patient has erosions about both SI joints. Electronically Signed   By: Inge Rise M.D.   On: 09/27/2020 15:09   Result Date: 09/27/2020 CLINICAL DATA:  Left hip pain since a fall 09/18/2020. Initial encounter. EXAM: DG HIP (WITH OR WITHOUT PELVIS) 2-3V LEFT COMPARISON:  None. FINDINGS: There is no evidence of hip fracture or dislocation. There is no evidence of arthropathy or other focal bone abnormality. IMPRESSION: Negative exam. Electronically Signed: By: Inge Rise M.D. On: 09/27/2020 14:03    Labs:  CBC: Recent Labs    09/29/20 0445 09/29/20 1119 09/30/20 0510 10/02/20 0506  WBC 4.8 5.1 4.5 5.8  HGB 8.6* 9.3* 8.5* 9.3*  HCT 27.3* 28.9* 27.2* 28.8*  PLT 230 251 212 235    COAGS: No results for input(s): INR, APTT in the last 8760 hours.  BMP: Recent Labs    09/29/20 0445  09/30/20 0510 10/01/20 0509 10/02/20 0506  NA 133* 133* 133* 133*  K 5.0 4.3 4.9 4.3  CL 94* 94* 94* 98  CO2 _0 GLUCOSE 87 84 85 85  BUN 63* 34* 50* 38*  CALCIUM 10.7* 10.2 11.1* 11.4*  CREATININE 9.30* 6.18* 7.74* 6.04*  GFRNONAA 4* 7* 5* 7*    LIVER FUNCTION TESTS: Recent Labs    08/08/20 0016 08/15/20 1002 09/29/20 0445 09/30/20 0510 10/01/20 0509 10/02/20 0506  BILITOT 0.9  --  0.8 0.8  --   --  AST 15  --  8* 9*  --   --   ALT 10  --  6 7  --   --   ALKPHOS 60  --  46 46  --   --   PROT 7.4  --  7.0 7.2  --   --   ALBUMIN 3.0*   < > 2.8* 2.7* 2.7* 2.8*   < > = values in this interval not displayed.    TUMOR MARKERS: No results for input(s): AFPTM, CEA, CA199, CHROMGRNA in the last 8760 hours.  Assessment and Plan: Hx myeloma Hypercalcemia For CT guided bone marrow biopsy Risks and benefits of bone marrow bx was discussed with the patient and/or patient's family including, but not limited to bleeding, infection, damage to adjacent structures or low yield requiring additional tests.  All of the questions were answered and there is agreement to proceed.  Consent signed and in chart.     Thank you for this interesting consult.  I greatly enjoyed meeting QUINTAVIA ROGSTAD and look forward to participating in their care.  A copy of this report was sent to the requesting provider on this date.  Electronically Signed: Ascencion Dike, PA-C 10/02/2020, 9:02 AM   I spent a total of 20 minutes in face to face in clinical consultation, greater than 50% of which was counseling/coordinating care for BM bx

## 2020-10-02 NOTE — Progress Notes (Signed)
Cassandra Joseph   DOB:[birth year]   IR#:[id number]    Subjective: Patient continues to feel weak.  Denies any fevers or chills. Inability to participate in physical therapy.  Awaiting bone marrow biopsy this morning.  Objective:  Vitals:   10/02/20 1700 10/02/20 2004  BP: 131/84 (!) 137/50  Pulse: (!) 53 (!) 52  Resp:  18  Temp:  98.2 F (36.8 C)  SpO2:  100%    No intake or output data in the 24 hours ending 10/02/20 2141  Physical Exam HENT:     Head: Normocephalic and atraumatic.     Mouth/Throat:     Mouth: Oropharynx is clear and moist.     Pharynx: No oropharyngeal exudate.  Eyes:     Pupils: Pupils are equal, round, and reactive to light.  Cardiovascular:     Rate and Rhythm: Normal rate and regular rhythm.  Pulmonary:     Effort: No respiratory distress.     Breath sounds: No wheezing.     Comments: Decreased air entry bilaterally. Abdominal:     General: Bowel sounds are normal. There is no distension.     Palpations: Abdomen is soft. There is no mass.     Tenderness: There is no abdominal tenderness. There is no guarding or rebound.  Musculoskeletal:        General: No tenderness or edema. Normal range of motion.     Cervical back: Normal range of motion and neck supple.  Skin:    General: Skin is warm.  Neurological:     Mental Status: She is alert and oriented to person, place, and time.  Psychiatric:        Mood and Affect: Affect normal.      Labs:  Lab Results  Component Value Date   WBC 5.8 10/02/2020   HGB 9.3 (L) 10/02/2020   HCT 28.8 (L) 10/02/2020   MCV 98.3 10/02/2020   PLT 235 10/02/2020   NEUTROABS 3.4 10/02/2020    Lab Results  Component Value Date   NA 133 (L) 10/02/2020   K 4.3 10/02/2020   CL 98 10/02/2020   CO2 26 10/02/2020    Studies:  CT Chest High Resolution  Result Date: 10/02/2020 CLINICAL DATA:  Inpatient. Hypercalcemia of uncertain etiology. Concern for sarcoidosis. EXAM: CT CHEST WITHOUT CONTRAST TECHNIQUE:  Multidetector CT imaging of the chest was performed following the standard protocol without intravenous contrast. High resolution imaging of the lungs, as well as inspiratory and expiratory imaging, was performed. COMPARISON:  11/09/2016 chest CT angiogram. FINDINGS: Cardiovascular: Moderate to marked cardiomegaly. No significant pericardial effusion/thickening. Three-vessel coronary atherosclerosis. Atherosclerotic nonaneurysmal thoracic aorta. Dilated main pulmonary artery (3.8 cm diameter). Mediastinum/Nodes: Hypodense partially calcified 1.6 cm posterior left thyroid nodule, not definitely seen on prior CT. Focus of coarse calcification in the posterior wall at the esophagogastric junction, unchanged. Otherwise normal esophagus. No axillary adenopathy. Right paratracheal adenopathy up to 1.1 cm short axis diameter (series 3/image 31), unchanged since 11/09/2016 chest CT. Stable mildly enlarged 1.1 cm subcarinal node (series 3/image 51). No discrete hilar adenopathy on these noncontrast images. Lungs/Pleura: No pneumothorax. No pleural effusion. No significant lobular air trapping or evidence of tracheobronchomalacia on the expiration sequence. Chronic marked elevation of the right hemidiaphragm with chronic platelike consolidation and curvilinear parenchymal bands at right lung base involving the intact right middle lobe with lesser involvement of the right upper and right lower lobes, compatible with a combination passive atelectasis and chronic nonspecific scarring. Mild chronic platelike scarring versus  atelectasis in lingula. Scattered mild interlobular septal thickening in both lungs. No significant regions of subpleural reticulation, traction bronchiectasis, architectural distortion, ground-glass opacity or frank honeycombing. No lung masses or significant pulmonary nodules. Upper abdomen: Chronic coarse central liver calcifications. Atrophic visualized kidneys with confluent innumerable small renal cysts.  Musculoskeletal: No aggressive appearing focal osseous lesions. Diffuse sclerosis of the thoracic osseous structures, more pronounced compared to prior chest CT. Moderate thoracic spondylosis. IMPRESSION: 1. Moderate to marked cardiomegaly. Scattered mild interlobular septal thickening, suggesting mild pulmonary edema. No pleural effusions. 2. Dilated main pulmonary artery, suggesting pulmonary arterial hypertension. 3. 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 4. Chronic marked elevation of the right hemidiaphragm with chronic platelike consolidation and curvilinear parenchymal bands at the right lung base, compatible with a combination of passive atelectasis and chronic nonspecific scarring. No evidence of interstitial lung disease. 5. Nonspecific mild mediastinal lymphadenopathy, stable since 11/09/2016 chest CT, most compatible with benign reactive adenopathy. 6. Hypodense 1.6 cm posterior left thyroid nodule, not definitely seen on prior CT. Recommend thyroid US (ref: J Am Coll Radiol. 2015 Feb;12(2): 143-50). 7. Aortic Atherosclerosis (ICD10-I70.0). Electronically Signed   By: Ilona Sorrel M.D.   On: 10/02/2020 12:34   CT BONE MARROW BIOPSY  Result Date: 10/02/2020 INDICATION: 75 year old female with history of multiple myeloma. EXAM: CT-GUIDED BONE MARROW BIOPSY AND ASPIRATION MEDICATIONS: None ANESTHESIA/SEDATION: Fentanyl 25 mcg IV; Versed 0.5 mg IV Sedation Time: 10 minutes; The patient was continuously monitored during the procedure by the interventional radiology nurse under my direct supervision. COMPLICATIONS: None immediate. PROCEDURE: Informed consent was obtained from the patient following an explanation of the procedure, risks, benefits and alternatives. The patient understands, agrees and consents for the procedure. All questions were  addressed. A time out was performed prior to the initiation of the procedure. The patient was positioned prone and non-contrast localization CT was performed of the pelvis to demonstrate the iliac marrow spaces. The operative site was prepped and draped in the usual sterile fashion. Under sterile conditions and local anesthesia, a 22 gauge spinal needle was utilized for procedural planning. Next, an 11 gauge coaxial bone biopsy needle was advanced into the right iliac marrow space. Needle position was confirmed with CT imaging. Initially, a bone marrow aspiration was performed. Next, a bone marrow biopsy was obtained with the 11 gauge outer bone marrow device. Samples were prepared with the cytotechnologist and deemed adequate. The needle was removed and superficial hemostasis was obtained with manual compression. A dressing was applied. The patient tolerated the procedure well without immediate post procedural complication. IMPRESSION: Successful CT guided right iliac bone marrow aspiration and core biopsy. Ruthann Cancer, MD [facility] Electronically Signed   By: Ruthann Cancer MD   On: 10/02/2020 12:55    Multiple myeloma [facility] [place]) #75 year old female patient with multiple medical problems including end-stage renal disease on dialysis; history of multiple myeloma-is currently admitted hospital for knee pain/effusion/incidentally noted to have lytic lesion in the skull.  #History of multiple myeloma-s/p Velcade dexamethasone treatment in 2007; given the lytic lesion noted on the skull-however skeletal survey negative for any abnormal lytic lesions.  Serum myeloma panel shows M protein 1.2 g; kappa/lambda light chain ratio slightly abnormal.  Based on myeloma panel/negative skeletal survey clinically less likely to be because of hypercalcemia.  Await bone marrow biopsy today.  Await PET scan outpatient.  #  Hypercalcemia calcium between 11 and 12-unclear  etiology.  Check ionized calcium. Discussed with Dr. Candiss Norse regarding use of hypocalcemic agents-bisphosphonates.  Patient has allergy to shellfish-contraindication to calcitonin. Await high-resolution CT to rule out adenopathy/sarcoidosis.  #End-stage renal disease/anemia [Dr.Singh]  #Joint effusion s/p aspiration question secondary to right arthritis.  Defer to primary team.  #Discussed with the patient; and her daughter [name].     Cammie Sickle, MD 10/02/2020

## 2020-10-02 NOTE — Progress Notes (Signed)
Cassandra Joseph  GHW:[id number] DOB: 04/03/1946 DOA: 09/27/2020 PCP: Tracie Harrier, MD    Brief Narrative:  75 year old with a history of ESRD on HD MWF, HTN, gout, morbid obesity, MM, rheumatoid arthritis, sleep apnea, and HLD who presented to [facility] with severe left hip pain and the inability to ambulate/bear weight following a fall 09/18/2020.  CT of the hip at time of admission was negative for an acute fracture.  Aspiration revealed no evidence of crystals but some blood.  Further intervention was not required.  Ultimately physical therapy suggested SNF placement.  Incidentally during her hospital stay the patient was found to have a lytic lesion of the skull worrisome for recurrence of her MM.  Oncology is presently evaluating this issue.  Antimicrobials:  None  DVT prophylaxis: Apixaban  Subjective: Midodrine resumed yesterday due to mild hypotension around time of dialysis.  Afebrile.  Vital signs stable.  No new complaints today.  States she is feeling stronger.  Appetite improving.  Assessment & Plan:  Idiopathic hypercalcemia Ca2+ has been noted to be persistently modestly elevated during this admit - Oncology investigating potential etiologies - bone marrow results pending - HRCT chest w/o worrisome pulmonary findings - considering a trial dose of pamidronate   Left hip posttraumatic effusion with intractable pain Joint effusion confirmed on CT with no evidence of fracture - joint aspirated 2/21 with blood appreciated but no evidence of infection or crystals - PT/OT recommend SNF  Rheumatoid arthritis -impaired ambulation/deconditioning PT/OT recommend SNF placement  Hypodense 1.6 cm posterior left thyroid nodule Noted on HRCT chest - will need thyroid US to further eval - this can be accomplished in the outpt setting   Progressive bilateral lower extremity weakness MRI lumbar spine revealed no soft tissue mass or severe spinal cord compression  Suspected MM  recurrence Lytic lesion noted in the left frontal bone- Oncology directed work-up - skeletal survey negative for abnormal lytic lesions - kappa/lambda light chain ratio slightly abnormal- outpatient PET scan to be pursued - bone marrow biopsy results pending   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 fistula Nephrology following   Anemia of CKD Erythropoietin per Nephrology - Hgb stable at ~8.5   HLD Continue atorvastatin  Paroxysmal atrial fibrillation Continue Eliquis and Coreg - HR well controlled   Essential HTN Blood pressure well controlled - mild hypotension 2/24 improved w/ resumption of midodrine   Peripheral neuropathy Continue gabapentin  Sleep apnea Continue nightly CPAP   Code Status: FULL CODE Family Communication:  Status is: Inpatient  Remains inpatient appropriate because:Ongoing diagnostic testing needed not appropriate for outpatient work up   Dispo: The patient is from: Home              Anticipated d/c is to: SNF              Anticipated d/c date is: 1 day              Patient currently is not medically stable to d/c.   Difficult to place patient No  Consultants:  none  Objective: Blood pressure 137/78, pulse 65, temperature 98.5 F (36.9 C), resp. rate 17, height _0  (1.676 m), weight 117.9 kg, SpO2 97 %.  Intake/Output Summary (Last 24 hours) at 10/02/2020 0815 Last data filed at 10/01/2020 1300 Gross per 24 hour  Intake --  Output 1500 ml  Net -1500 ml   Filed Weights   09/27/20 1228  Weight: 117.9 kg    Examination: General: No acute respiratory  distress Lungs: CTA B  Cardiovascular: RRR w/o M or rub  Abdomen: NT/ND, soft, overweight, bs+ Extremities: No signif C/C/E B LE   CBC: Recent Labs  Lab 09/27/20 1539 09/28/20 0528 09/29/20 1119 09/30/20 0510 10/02/20 0506  WBC 7.5   < > 5.1 4.5 5.8  NEUTROABS 5.0  --   --   --  3.4  HGB 11.1*   < > 9.3* 8.5* 9.3*  HCT 36.7   < > 28.9* 27.2* 28.8*  MCV 101.7*   < > 97.6  99.3 98.3  PLT 260   < > 251 212 235   < > = values in this interval not displayed.   Basic Metabolic Panel: Recent Labs  Lab 09/29/20 0445 09/30/20 0510 10/01/20 0509 10/02/20 0506  NA 133* 133* 133* 133*  K 5.0 4.3 4.9 4.3  CL 94* 94* 94* 98  CO2 _0 GLUCOSE 87 84 85 85  BUN 63* 34* 50* 38*  CREATININE 9.30* 6.18* 7.74* 6.04*  CALCIUM 10.7* 10.2 11.1* 11.4*  MG 2.3 1.9  --   --   PHOS  --   --  7.1* 6.4*   GFR: Estimated Creatinine Clearance: 10.8 mL/min (A) (by C-G formula based on SCr of 6.04 mg/dL (H)).  Liver Function Tests: Recent Labs  Lab 09/29/20 0445 09/30/20 0510 10/01/20 0509 10/02/20 0506  AST 8* 9*  --   --   ALT 6 7  --   --   ALKPHOS 46 46  --   --   BILITOT 0.8 0.8  --   --   PROT 7.0 7.2  --   --   ALBUMIN 2.8* 2.7* 2.7* 2.8*    HbA1C: Hgb A1c MFr Bld  Date/Time Value Ref Range Status  08/07/2020 11:07 PM 5.2 4.8 - 5.6 % Final    Comment:    (NOTE) Pre diabetes:          5.7%-6.4%  Diabetes:              >6.4%  Glycemic control for   <7.0% adults with diabetes     CBG: Recent Labs  Lab 09/29/20 0752 09/29/20 1123 09/29/20 1644 09/30/20 0051  GLUCAP 88 102* 77 101*    Recent Results (from the past 240 hour(s))  Resp Panel by RT-PCR (Flu A&B, Covid) Nasopharyngeal Swab     Status: None   Collection Time: 09/27/20  7:46 PM   Specimen: Nasopharyngeal Swab; Nasopharyngeal(NP) swabs in vial transport medium  Result Value Ref Range Status   SARS Coronavirus 2 by RT PCR NEGATIVE NEGATIVE Final    Comment: (NOTE) SARS-CoV-2 target nucleic acids are NOT DETECTED.  The SARS-CoV-2 RNA is generally detectable in upper respiratory specimens during the acute phase of infection. The lowest concentration of SARS-CoV-2 viral copies this assay can detect is 138 copies/mL. A negative result does not preclude SARS-Cov-2 infection and should not be used as the sole basis for treatment or other patient management decisions. A  negative result may occur with  improper specimen collection/handling, submission of specimen other than nasopharyngeal swab, presence of viral mutation(s) within the areas targeted by this assay, and inadequate number of viral copies(<138 copies/mL). A negative result must be combined with clinical observations, patient history, and epidemiological information. The expected result is Negative.  Fact Sheet for Patients:  EntrepreneurPulse.com.au  Fact Sheet for Healthcare Providers:  IncredibleEmployment.be  This test is no t yet approved or cleared by the [place] and  has been authorized for detection and/or diagnosis of SARS-CoV-2 by FDA under an Emergency Use Authorization (EUA). This EUA will remain  in effect (meaning this test can be used) for the duration of the COVID-19 declaration under Section 564(b)(1) of the Act, 21 U.S.C.section 360bbb-3(b)(1), unless the authorization is terminated  or revoked sooner.       Influenza A by PCR NEGATIVE NEGATIVE Final   Influenza B by PCR NEGATIVE NEGATIVE Final    Comment: (NOTE) The Xpert Xpress SARS-CoV-2/FLU/RSV plus assay is intended as an aid in the diagnosis of influenza from Nasopharyngeal swab specimens and should not be used as a sole basis for treatment. Nasal washings and aspirates are unacceptable for Xpert Xpress SARS-CoV-2/FLU/RSV testing.  Fact Sheet for Patients: EntrepreneurPulse.com.au  Fact Sheet for Healthcare Providers: IncredibleEmployment.be  This test is not yet approved or cleared by the [place] FDA and has been authorized for detection and/or diagnosis of SARS-CoV-2 by FDA under an Emergency Use Authorization (EUA). This EUA will remain in effect (meaning this test can be used) for the duration of the COVID-19 declaration under Section 564(b)(1) of the Act, 21 U.S.C. section 360bbb-3(b)(1), unless the authorization  is terminated or revoked.  Performed at [facility], [address] [place], [place] [address]   Body fluid culture w Gram Stain     Status: None   Collection Time: 09/28/20  8:47 AM   Specimen: Pleura; Body Fluid  Result Value Ref Range Status   Specimen Description   Final    PLEURAL Performed at [facility], [address], [place], [place] [address]    Special Requests   Final    NONE Performed at [facility], [address] [place], [place] [address]    Gram Stain NO WBC SEEN NO ORGANISMS SEEN   Final   Culture   Final    NO GROWTH Performed at [facility], [place] [address] [place], [place] [address]    Report Status 10/02/2020 FINAL  Final     Scheduled Meds: . apixaban  2.5 mg Oral BID  . atorvastatin  80 mg Oral Daily  . carvedilol  25 mg Oral BID WC  . Chlorhexidine Gluconate Cloth  6 each Topical Q0600  . cinacalcet  60 mg Oral BID WC  . epoetin (EPOGEN/PROCRIT) injection  4,000 Units Intravenous Q T,Th,Sa-HD  . gabapentin  300 mg Oral TID  . irbesartan  300 mg Oral QHS  . midodrine  10 mg Oral TID WC  . sevelamer carbonate  800 mg Oral QID     LOS: 4 days   Cherene Altes, MD [facility] Office  [phone] Pager - Text Page per Shea Evans  If 7PM-7AM, please contact night-coverage per Amion 10/02/2020, 8:15 AM

## 2020-10-03 DIAGNOSIS — M25452 Effusion, left hip: Secondary | ICD-10-CM | POA: Diagnosis not present

## 2020-10-03 LAB — RENAL FUNCTION PANEL
Albumin: 2.7 g/dL — ABNORMAL LOW (ref 3.5–5.0)
Anion gap: 11 (ref 5–15)
BUN: 53 mg/dL — ABNORMAL HIGH (ref 8–23)
CO2: 26 mmol/L (ref 22–32)
Calcium: 11.1 mg/dL — ABNORMAL HIGH (ref 8.9–10.3)
Chloride: 98 mmol/L (ref 98–111)
Creatinine, Ser: 7.45 mg/dL — ABNORMAL HIGH (ref 0.44–1.00)
GFR, Estimated: 5 mL/min — ABNORMAL LOW (ref >60)
Glucose, Bld: 79 mg/dL (ref 70–99)
Phosphorus: 7.5 mg/dL — ABNORMAL HIGH (ref 2.5–4.6)
Potassium: 5.1 mmol/L (ref 3.5–5.1)
Sodium: 135 mmol/L (ref 135–145)

## 2020-10-03 LAB — CALCITRIOL (1,25 DI-OH VIT D): Vit D, 1,25-Dihydroxy: 71.5 pg/mL (ref 19.9–79.3)

## 2020-10-03 MED ORDER — ZOLPIDEM TARTRATE 5 MG PO TABS: 5.0000 mg | ORAL_TABLET | Freq: Every evening | ORAL | Status: DC | PRN

## 2020-10-03 MED ORDER — SODIUM CHLORIDE 0.9 % IV SOLN
60.0000 mg | Freq: Once | INTRAVENOUS | Status: AC
  Administered 2020-10-03: 60 mg via INTRAVENOUS
  Filled 2020-10-03: qty 20

## 2020-10-03 NOTE — Progress Notes (Signed)
Cassandra Joseph   DOB:November 16, 74 1947   ZO#:[id number]    Subjective: Patient denies any headaches but denies any nausea.  Continues to feel for/fatigue.  Objective:  Vitals:   10/03/20 2130 10/03/20 2145  BP: (!) 100/55 (!) 93/35  Pulse: (!) 48 (!) 55  Resp:  20  Temp:    SpO2: 100% 100%     Intake/Output Summary (Last 24 hours) at 10/03/2020 2304 Last data filed at 10/03/2020 2115 Gross per 24 hour  Intake 240 ml  Output 1719 ml  Net -1479 ml    Physical Exam Constitutional:      Comments: Morbidly obese female patient.  Resting in bed.  HENT:     Head: Normocephalic and atraumatic.     Mouth/Throat:     Mouth: Oropharynx is clear and moist.     Pharynx: No oropharyngeal exudate.  Eyes:     Pupils: Pupils are equal, round, and reactive to light.  Cardiovascular:     Rate and Rhythm: Normal rate and regular rhythm.  Pulmonary:     Effort: No respiratory distress.     Breath sounds: No wheezing.     Comments: Decreased breath sounds bilateral bases. Abdominal:     General: Bowel sounds are normal. There is no distension.     Palpations: Abdomen is soft. There is no mass.     Tenderness: There is no abdominal tenderness. There is no guarding or rebound.  Musculoskeletal:        General: No tenderness or edema. Normal range of motion.     Cervical back: Normal range of motion and neck supple.  Skin:    General: Skin is warm.  Neurological:     Mental Status: She is alert and oriented to person, place, and time.  Psychiatric:        Mood and Affect: Affect normal.      Labs:  Lab Results  Component Value Date   WBC 5.8 10/02/2020   HGB 9.3 (L) 10/02/2020   HCT 28.8 (L) 10/02/2020   MCV 98.3 10/02/2020   PLT 235 10/02/2020   NEUTROABS 3.4 10/02/2020    Lab Results  Component Value Date   NA 135 10/03/2020   K 5.1 10/03/2020   CL 98 10/03/2020   CO2 26 10/03/2020    Studies:  CT Chest High Resolution  Result Date: 10/02/2020 CLINICAL DATA:  Inpatient.  Hypercalcemia of uncertain etiology. Concern for sarcoidosis. EXAM: CT CHEST WITHOUT CONTRAST TECHNIQUE: Multidetector CT imaging of the chest was performed following the standard protocol without intravenous contrast. High resolution imaging of the lungs, as well as inspiratory and expiratory imaging, was performed. COMPARISON:  11/09/2016 chest CT angiogram. FINDINGS: Cardiovascular: Moderate to marked cardiomegaly. No significant pericardial effusion/thickening. Three-vessel coronary atherosclerosis. Atherosclerotic nonaneurysmal thoracic aorta. Dilated main pulmonary artery (3.8 cm diameter). Mediastinum/Nodes: Hypodense partially calcified 1.6 cm posterior left thyroid nodule, not definitely seen on prior CT. Focus of coarse calcification in the posterior wall at the esophagogastric junction, unchanged. Otherwise normal esophagus. No axillary adenopathy. Right paratracheal adenopathy up to 1.1 cm short axis diameter (series 3/image 31), unchanged since 11/09/2016 chest CT. Stable mildly enlarged 1.1 cm subcarinal node (series 3/image 51). No discrete hilar adenopathy on these noncontrast images. Lungs/Pleura: No pneumothorax. No pleural effusion. No significant lobular air trapping or evidence of tracheobronchomalacia on the expiration sequence. Chronic marked elevation of the right hemidiaphragm with chronic platelike consolidation and curvilinear parenchymal bands at right lung base involving the intact right middle lobe with lesser  involvement of the right upper and right lower lobes, compatible with a combination passive atelectasis and chronic nonspecific scarring. Mild chronic platelike scarring versus atelectasis in lingula. Scattered mild interlobular septal thickening in both lungs. No significant regions of subpleural reticulation, traction bronchiectasis, architectural distortion, ground-glass opacity or frank honeycombing. No lung masses or significant pulmonary nodules. Upper abdomen: Chronic  coarse central liver calcifications. Atrophic visualized kidneys with confluent innumerable small renal cysts. Musculoskeletal: No aggressive appearing focal osseous lesions. Diffuse sclerosis of the thoracic osseous structures, more pronounced compared to prior chest CT. Moderate thoracic spondylosis. IMPRESSION: 1. Moderate to marked cardiomegaly. Scattered mild interlobular septal thickening, suggesting mild pulmonary edema. No pleural effusions. 2. Dilated main pulmonary artery, suggesting pulmonary arterial hypertension. 3. 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 4. Chronic marked elevation of the right hemidiaphragm with chronic platelike consolidation and curvilinear parenchymal bands at the right lung base, compatible with a combination of passive atelectasis and chronic nonspecific scarring. No evidence of interstitial lung disease. 5. Nonspecific mild mediastinal lymphadenopathy, stable since 11/09/2016 chest CT, most compatible with benign reactive adenopathy. 6. Hypodense 1.6 cm posterior left thyroid nodule, not definitely seen on prior CT. Recommend thyroid US (ref: J Am Coll Radiol. 2015 Feb;12(2): 143-50). 7. Aortic Atherosclerosis (ICD10-I70.0). Electronically Signed   By: Ilona Sorrel M.D.   On: 10/02/2020 12:34   CT BONE MARROW BIOPSY  Result Date: 10/02/2020 INDICATION: 75 year old female with history of multiple myeloma. EXAM: CT-GUIDED BONE MARROW BIOPSY AND ASPIRATION MEDICATIONS: None ANESTHESIA/SEDATION: Fentanyl 25 mcg IV; Versed 0.5 mg IV Sedation Time: 10 minutes; The patient was continuously monitored during the procedure by the interventional radiology nurse under my direct supervision. COMPLICATIONS: None immediate. PROCEDURE: Informed consent was obtained from the patient following an explanation of the procedure, risks,  benefits and alternatives. The patient understands, agrees and consents for the procedure. All questions were addressed. A time out was performed prior to the initiation of the procedure. The patient was positioned prone and non-contrast localization CT was performed of the pelvis to demonstrate the iliac marrow spaces. The operative site was prepped and draped in the usual sterile fashion. Under sterile conditions and local anesthesia, a 22 gauge spinal needle was utilized for procedural planning. Next, an 11 gauge coaxial bone biopsy needle was advanced into the right iliac marrow space. Needle position was confirmed with CT imaging. Initially, a bone marrow aspiration was performed. Next, a bone marrow biopsy was obtained with the 11 gauge outer bone marrow device. Samples were prepared with the cytotechnologist and deemed adequate. The needle was removed and superficial hemostasis was obtained with manual compression. A dressing was applied. The patient tolerated the procedure well without immediate post procedural complication. IMPRESSION: Successful CT guided right iliac bone marrow aspiration and core biopsy. Ruthann Cancer, MD [facility] Radiology Electronically Signed   By: Ruthann Cancer MD   On: 10/02/2020 12:55    Multiple myeloma [facility]) #75 year old female patient with multiple medical problems including end-stage renal disease on dialysis; history of multiple myeloma-is currently admitted hospital for knee pain/effusion/incidentally noted to have lytic lesion in the skull.  #History of multiple myeloma-s/p Velcade dexamethasone treatment in 2007.  At this admission 8 mm lytic lesion noted on the skull-however skeletal survey negative for any abnormal lytic lesions.  Serum myeloma panel shows M protein 1.2 g; kappa/lambda light chain ratio slightly  abnormal.  Based on myeloma panel/negative skeletal survey clinically less likely to be because of  hypercalcemia.  Status post bone marrow biopsy- on 2/25; await pathology.  #Hypercalcemia calcium between 11 and 12-unclear etiology.  Check ionized calcium. Discussed with Dr. Mechele Collin regarding use of hypocalcemic agents-bisphosphonates.  Ordered pamidronate 60 mg over 6 hours IV patient has allergy to shellfish-contraindication to calcitonin.  No obvious evidence of/concerns for sarcoidosis based on chest.  #End-stage renal disease/anemia [Dr.Singh]  #Joint effusion s/p aspiration question secondary to right arthritis.  Defer to primary team.  #Discussed with the patient; and her daughter, Pam-discussed above plan.   Cammie Sickle, MD 10/03/2020  11:04 PM

## 2020-10-03 NOTE — Progress Notes (Signed)
Cassandra Joseph  WGN:[id number] DOB: 12/10/1945 DOA: 09/27/2020 PCP: Tracie Harrier, MD    Brief Narrative:  76 year old with a history of ESRD on HD MWF, HTN, gout, morbid obesity, MM, rheumatoid arthritis, sleep apnea, and HLD who presented to [facility] with severe left hip pain and the inability to ambulate/bear weight following a fall 09/18/2020.  CT of the hip at time of admission was negative for an acute fracture.  Aspiration revealed no evidence of crystals but some blood.  Further intervention was not required.  Ultimately physical therapy suggested SNF placement.  Incidentally during her hospital stay the patient was found to have a lytic lesion of the skull worrisome for recurrence of her MM.  Oncology is presently evaluating this issue.  Antimicrobials:  None  DVT prophylaxis: Apixaban  Subjective: Afebrile.  Vital signs stable.  Oxygen saturations 100% on 3 L nasal cannula.  Calcium remains elevated at 11.1 with albumin 2.7.  Assessment & Plan:  Idiopathic hypercalcemia Ca2+ has been noted to be persistently modestly elevated during this admit - Oncology investigating potential etiologies, but MM not felt to be active/etiology - bone marrow results pending - HRCT chest w/o worrisome pulmonary findings - pamidronate to be dosed today   Left hip posttraumatic effusion with intractable pain Joint effusion confirmed on CT with no evidence of fracture - joint aspirated 2/21 with blood appreciated but no evidence of infection or crystals - PT/OT recommend SNF  Rheumatoid arthritis - impaired ambulation/deconditioning PT/OT recommend SNF placement  Hypodense 1.6 cm posterior left thyroid nodule Noted on HRCT chest - will need thyroid US to further eval - this can be accomplished in the outpt setting   Progressive bilateral lower extremity weakness MRI lumbar spine revealed no soft tissue mass or severe spinal cord compression  Multiple Myeloma  Lytic lesion noted in the left  frontal bone- Oncology directed work-up - skeletal survey negative for abnormal lytic lesions - kappa/lambda light chain ratio slightly abnormal- outpatient PET scan to be pursued - bone marrow biopsy results pending and to be followed up by Oncology   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 fistula Nephrology following   Anemia of CKD Erythropoietin per Nephrology - Hgb stable at ~8.5   HLD Continue atorvastatin  Paroxysmal atrial fibrillation Continue Eliquis and Coreg - HR well controlled   Essential HTN Blood pressure well controlled - mild hypotension 2/24 improved w/ resumption of midodrine - attempting to stop antihypertension meds   Peripheral neuropathy Continue gabapentin  Sleep apnea Continue nightly CPAP  Disposition If tolerates pamidronate w/o difficulty anticipate d/c to SNF Monday AM   Code Status: FULL CODE Family Communication:  Status is: Inpatient  Remains inpatient appropriate because:Ongoing diagnostic testing needed not appropriate for outpatient work up   Dispo: The patient is from: Home              Anticipated d/c is to: SNF              Anticipated d/c date is: 1 day              Patient currently is not medically stable to d/c.   Difficult to place patient No  Consultants:  none  Objective: Blood pressure 132/75, pulse 64, temperature 97.8 F (36.6 C), resp. rate 16, height 5' 6"  (1.676 m), weight 117.9 kg, SpO2 100 %. No intake or output data in the 24 hours ending 10/03/20 0918 Filed Weights   09/27/20 1228  Weight: 117.9 kg  Examination: General: No acute respiratory distress Lungs: CTA B  Cardiovascular: RRR  Abdomen: NT/ND, soft, overweight, bs+ Extremities: No signif C/C/E B lower extremities   CBC: Recent Labs  Lab 09/27/20 1539 09/28/20 0528 09/29/20 1119 09/30/20 0510 10/02/20 0506  WBC 7.5   < > 5.1 4.5 5.8  NEUTROABS 5.0  --   --   --  3.4  HGB 11.1*   < > 9.3* 8.5* 9.3*  HCT 36.7   < > 28.9* 27.2*  28.8*  MCV 101.7*   < > 97.6 99.3 98.3  PLT 260   < > 251 212 235   < > = values in this interval not displayed.   Basic Metabolic Panel: Recent Labs  Lab 09/29/20 0445 09/30/20 0510 10/01/20 0509 10/02/20 0506 10/03/20 0459  NA 133* 133* 133* 133* 135  K 5.0 4.3 4.9 4.3 5.1  CL 94* 94* 94* 98 98  CO2 27 29 26 26 26   GLUCOSE 87 84 85 85 79  BUN 63* 34* 50* 38* 53*  CREATININE 9.30* 6.18* 7.74* 6.04* 7.45*  CALCIUM 10.7* 10.2 11.1* 11.4* 11.1*  MG 2.3 1.9  --   --   --   PHOS  --   --  7.1* 6.4* 7.5*   GFR: Estimated Creatinine Clearance: 8.8 mL/min (A) (by C-G formula based on SCr of 7.45 mg/dL (H)).  Liver Function Tests: Recent Labs  Lab 09/29/20 0445 09/30/20 0510 10/01/20 0509 10/02/20 0506 10/03/20 0459  AST 8* 9*  --   --   --   ALT 6 7  --   --   --   ALKPHOS 46 46  --   --   --   BILITOT 0.8 0.8  --   --   --   PROT 7.0 7.2  --   --   --   ALBUMIN 2.8* 2.7* 2.7* 2.8* 2.7*    HbA1C: Hgb A1c MFr Bld  Date/Time Value Ref Range Status  08/07/2020 11:07 PM 5.2 4.8 - 5.6 % Final    Comment:    (NOTE) Pre diabetes:          5.7%-6.4%  Diabetes:              >6.4%  Glycemic control for   <7.0% adults with diabetes     CBG: Recent Labs  Lab 09/29/20 0752 09/29/20 1123 09/29/20 1644 09/30/20 0051  GLUCAP 88 102* 77 101*    Recent Results (from the past 240 hour(s))  Resp Panel by RT-PCR (Flu A&B, Covid) Nasopharyngeal Swab     Status: None   Collection Time: 09/27/20  7:46 PM   Specimen: Nasopharyngeal Swab; Nasopharyngeal(NP) swabs in vial transport medium  Result Value Ref Range Status   SARS Coronavirus 2 by RT PCR NEGATIVE NEGATIVE Final    Comment: (NOTE) SARS-CoV-2 target nucleic acids are NOT DETECTED.  The SARS-CoV-2 RNA is generally detectable in upper respiratory specimens during the acute phase of infection. The lowest concentration of SARS-CoV-2 viral copies this assay can detect is 138 copies/mL. A negative result does not  preclude SARS-Cov-2 infection and should not be used as the sole basis for treatment or other patient management decisions. A negative result may occur with  improper specimen collection/handling, submission of specimen other than nasopharyngeal swab, presence of viral mutation(s) within the areas targeted by this assay, and inadequate number of viral copies(<138 copies/mL). A negative result must be combined with clinical observations, patient history, and epidemiological information. The expected result  is Negative.  Fact Sheet for Patients:  EntrepreneurPulse.com.au  Fact Sheet for Healthcare Providers:  IncredibleEmployment.be  This test is no t yet approved or cleared by the [place] FDA and  has been authorized for detection and/or diagnosis of SARS-CoV-2 by FDA under an Emergency Use Authorization (EUA). This EUA will remain  in effect (meaning this test can be used) for the duration of the COVID-19 declaration under Section 564(b)(1) of the Act, 21 U.S.C.section 360bbb-3(b)(1), unless the authorization is terminated  or revoked sooner.       Influenza A by PCR NEGATIVE NEGATIVE Final   Influenza B by PCR NEGATIVE NEGATIVE Final    Comment: (NOTE) The Xpert Xpress SARS-CoV-2/FLU/RSV plus assay is intended as an aid in the diagnosis of influenza from Nasopharyngeal swab specimens and should not be used as a sole basis for treatment. Nasal washings and aspirates are unacceptable for Xpert Xpress SARS-CoV-2/FLU/RSV testing.  Fact Sheet for Patients: EntrepreneurPulse.com.au  Fact Sheet for Healthcare Providers: IncredibleEmployment.be  This test is not yet approved or cleared by the [place] FDA and has been authorized for detection and/or diagnosis of SARS-CoV-2 by FDA under an Emergency Use Authorization (EUA). This EUA will remain in effect (meaning this test can be used) for the  duration of the COVID-19 declaration under Section 564(b)(1) of the Act, 21 U.S.C. section 360bbb-3(b)(1), unless the authorization is terminated or revoked.  Performed at [facility], [address] [place], [place] [address]   Body fluid culture w Gram Stain     Status: None   Collection Time: 09/28/20  8:47 AM   Specimen: Pleura; Body Fluid  Result Value Ref Range Status   Specimen Description   Final    PLEURAL Performed at [facility], [address] [place], [place] [address]    Special Requests   Final    NONE Performed at [facility], [address] [place], [place] [address]    Gram Stain NO WBC SEEN NO ORGANISMS SEEN   Final   Culture   Final    NO GROWTH Performed at [facility], [place] [address], [place], [place] [address]    Report Status 10/02/2020 FINAL  Final     Scheduled Meds: . apixaban  2.5 mg Oral BID  . atorvastatin  80 mg Oral Daily  . carvedilol  12.5 mg Oral BID WC  . Chlorhexidine Gluconate Cloth  6 each Topical Q0600  . cinacalcet  60 mg Oral BID WC  . epoetin (EPOGEN/PROCRIT) injection  4,000 Units Intravenous Q T,Th,Sa-HD  . gabapentin  300 mg Oral TID  . midodrine  10 mg Oral TID WC  . sevelamer carbonate  800 mg Oral QID     LOS: 5 days   Cherene Altes, MD [facility] Office  [phone] Pager - Text Page per Shea Evans  If 7PM-7AM, please contact night-coverage per Amion 10/03/2020, 9:18 AM

## 2020-10-03 NOTE — Progress Notes (Signed)
[facility], [place] 10/03/20  Subjective:   LOS: 5  Patient known to our practice from outpatient dialysis.   As an outpatient patient dialyzes at [facility] on Monday Wednesday and Friday shift. Patient's main reason for admission is pain in the left foot and hip.   Patient seen today on the first floor.  Patient resting comfortably in the bed Patient voices no new specific concerns      Objective:  Vital signs in last 24 hours:  Temp:  [97.4 F (36.3 C)-98.2 F (36.8 C)] 98 F (36.7 C) (02/26 1129) Pulse Rate:  [52-64] 57 (02/26 1129) Resp:  [16-18] 16 (02/26 1129) BP: (117-137)/(48-84) 127/55 (02/26 1129) SpO2:  [95 %-100 %] 95 % (02/26 1129)  Weight change:  Filed Weights   09/27/20 1228  Weight: 117.9 kg    Intake/Output:    Intake/Output Summary (Last 24 hours) at 10/03/2020 1410 Last data filed at 10/03/2020 1041 Gross per 24 hour  Intake 240 ml  Output --  Net 240 ml    Physical Exam: General:  No acute distress, laying in bed  HEENT  anicteric, moist oral mucous membrane  Pulm/lungs  normal breathing effort, lungs are clear  CVS/Heart  irregular rhythm, no rub or gallop  Abdomen:   Soft, nontender  Extremities:  Trace peripheral edema  Neurologic:  Alert, oriented, able to follow commands  Skin:  No acute rashes  Access- Right arm AV fistula  Basic Metabolic Panel:  Recent Labs  Lab 09/29/20 0445 09/30/20 0510 10/01/20 0509 10/02/20 0506 10/03/20 0459  NA 133* 133* 133* 133* 135  K 5.0 4.3 4.9 4.3 5.1  CL 94* 94* 94* 98 98  CO2 27 29 26 26 26   GLUCOSE 87 84 85 85 79  BUN 63* 34* 50* 38* 53*  CREATININE 9.30* 6.18* 7.74* 6.04* 7.45*  CALCIUM 10.7* 10.2 11.1* 11.4* 11.1*  MG 2.3 1.9  --   --   --   PHOS  --   --  7.1* 6.4* 7.5*     CBC: Recent Labs  Lab 09/27/20 1539 09/28/20 0528 09/29/20 0445 09/29/20 1119 09/30/20 0510 10/02/20 0506  WBC 7.5 5.3 4.8 5.1 4.5 5.8  NEUTROABS 5.0  --   --    --   --  3.4  HGB 11.1* 9.6* 8.6* 9.3* 8.5* 9.3*  HCT 36.7 29.4* 27.3* 28.9* 27.2* 28.8*  MCV 101.7* 98.0 98.6 97.6 99.3 98.3  PLT 260 238 230 251 212 235      Lab Results  Component Value Date   HEPBSAG NON REACTIVE 09/29/2020   HEPBSAB Reactive 01/19/2017      Microbiology:  Recent Results (from the past 240 hour(s))  Resp Panel by RT-PCR (Flu A&B, Covid) Nasopharyngeal Swab     Status: None   Collection Time: 09/27/20  7:46 PM   Specimen: Nasopharyngeal Swab; Nasopharyngeal(NP) swabs in vial transport medium  Result Value Ref Range Status   SARS Coronavirus 2 by RT PCR NEGATIVE NEGATIVE Final    Comment: (NOTE) SARS-CoV-2 target nucleic acids are NOT DETECTED.  The SARS-CoV-2 RNA is generally detectable in upper respiratory specimens during the acute phase of infection. The lowest concentration of SARS-CoV-2 viral copies this assay can detect is 138 copies/mL. A negative result does not preclude SARS-Cov-2 infection and should not be used as the sole basis for treatment or other patient management decisions. A negative result may occur with  improper specimen collection/handling, submission of specimen other than nasopharyngeal swab,  presence of viral mutation(s) within the areas targeted by this assay, and inadequate number of viral copies(<138 copies/mL). A negative result must be combined with clinical observations, patient history, and epidemiological information. The expected result is Negative.  Fact Sheet for Patients:  EntrepreneurPulse.com.au  Fact Sheet for Healthcare Providers:  IncredibleEmployment.be  This test is no t yet approved or cleared by the [place] FDA and  has been authorized for detection and/or diagnosis of SARS-CoV-2 by FDA under an Emergency Use Authorization (EUA). This EUA will remain  in effect (meaning this test can be used) for the duration of the COVID-19 declaration under Section 564(b)(1)  of the Act, 21 U.S.C.section 360bbb-3(b)(1), unless the authorization is terminated  or revoked sooner.       Influenza A by PCR NEGATIVE NEGATIVE Final   Influenza B by PCR NEGATIVE NEGATIVE Final    Comment: (NOTE) The Xpert Xpress SARS-CoV-2/FLU/RSV plus assay is intended as an aid in the diagnosis of influenza from Nasopharyngeal swab specimens and should not be used as a sole basis for treatment. Nasal washings and aspirates are unacceptable for Xpert Xpress SARS-CoV-2/FLU/RSV testing.  Fact Sheet for Patients: EntrepreneurPulse.com.au  Fact Sheet for Healthcare Providers: IncredibleEmployment.be  This test is not yet approved or cleared by the [place] FDA and has been authorized for detection and/or diagnosis of SARS-CoV-2 by FDA under an Emergency Use Authorization (EUA). This EUA will remain in effect (meaning this test can be used) for the duration of the COVID-19 declaration under Section 564(b)(1) of the Act, 21 U.S.C. section 360bbb-3(b)(1), unless the authorization is terminated or revoked.  Performed at [facility], [address] [place], [place] [address]   Body fluid culture w Gram Stain     Status: None   Collection Time: 09/28/20  8:47 AM   Specimen: Pleura; Body Fluid  Result Value Ref Range Status   Specimen Description   Final    PLEURAL Performed at [facility], [address] [place], [place] [address]    Special Requests   Final    NONE Performed at [facility], [address] [place], [place] [address]    Gram Stain NO WBC SEEN NO ORGANISMS SEEN   Final   Culture   Final    NO GROWTH Performed at [facility], [place] [address], [place],  [address]    Report Status 10/02/2020 FINAL  Final    Coagulation Studies: No results for input(s): LABPROT, INR in the last 72 hours.  Urinalysis: No results for input(s): COLORURINE, LABSPEC, PHURINE, GLUCOSEU,  HGBUR, BILIRUBINUR, KETONESUR, PROTEINUR, UROBILINOGEN, NITRITE, LEUKOCYTESUR in the last 72 hours.  Invalid input(s): APPERANCEUR    Imaging: CT Chest High Resolution  Result Date: 10/02/2020 CLINICAL DATA:  Inpatient. Hypercalcemia of uncertain etiology. Concern for sarcoidosis. EXAM: CT CHEST WITHOUT CONTRAST TECHNIQUE: Multidetector CT imaging of the chest was performed following the standard protocol without intravenous contrast. High resolution imaging of the lungs, as well as inspiratory and expiratory imaging, was performed. COMPARISON:  11/09/2016 chest CT angiogram. FINDINGS: Cardiovascular: Moderate to marked cardiomegaly. No significant pericardial effusion/thickening. Three-vessel coronary atherosclerosis. Atherosclerotic nonaneurysmal thoracic aorta. Dilated main pulmonary artery (3.8 cm diameter). Mediastinum/Nodes: Hypodense partially calcified 1.6 cm posterior left thyroid nodule, not definitely seen on prior CT. Focus of coarse calcification in the posterior wall at the esophagogastric junction, unchanged. Otherwise normal esophagus. No axillary adenopathy. Right paratracheal adenopathy up to 1.1 cm short axis diameter (series 3/image 31), unchanged since 11/09/2016 chest  CT. Stable mildly enlarged 1.1 cm subcarinal node (series 3/image 51). No discrete hilar adenopathy on these noncontrast images. Lungs/Pleura: No pneumothorax. No pleural effusion. No significant lobular air trapping or evidence of tracheobronchomalacia on the expiration sequence. Chronic marked elevation of the right hemidiaphragm with chronic platelike consolidation and curvilinear parenchymal bands at right lung base involving the intact right middle lobe with lesser involvement of the right upper and right lower lobes, compatible with a combination passive atelectasis and chronic nonspecific scarring. Mild chronic platelike scarring versus atelectasis in lingula. Scattered mild interlobular septal thickening in both  lungs. No significant regions of subpleural reticulation, traction bronchiectasis, architectural distortion, ground-glass opacity or frank honeycombing. No lung masses or significant pulmonary nodules. Upper abdomen: Chronic coarse central liver calcifications. Atrophic visualized kidneys with confluent innumerable small renal cysts. Musculoskeletal: No aggressive appearing focal osseous lesions. Diffuse sclerosis of the thoracic osseous structures, more pronounced compared to prior chest CT. Moderate thoracic spondylosis. IMPRESSION: 1. Moderate to marked cardiomegaly. Scattered mild interlobular septal thickening, suggesting mild pulmonary edema. No pleural effusions. 2. Dilated main pulmonary artery, suggesting pulmonary arterial hypertension. 3. 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 4. Chronic marked elevation of the right hemidiaphragm with chronic platelike consolidation and curvilinear parenchymal bands at the right lung base, compatible with a combination of passive atelectasis and chronic nonspecific scarring. No evidence of interstitial lung disease. 5. Nonspecific mild mediastinal lymphadenopathy, stable since 11/09/2016 chest CT, most compatible with benign reactive adenopathy. 6. Hypodense 1.6 cm posterior left thyroid nodule, not definitely seen on prior CT. Recommend thyroid US (ref: J Am Coll Radiol. 2015 Feb;12(2): 143-50). 7. Aortic Atherosclerosis (ICD10-I70.0). Electronically Signed   By: Ilona Sorrel M.D.   On: 10/02/2020 12:34   CT BONE MARROW BIOPSY  Result Date: 10/02/2020 INDICATION: 75 year old female with history of multiple myeloma. EXAM: CT-GUIDED BONE MARROW BIOPSY AND ASPIRATION MEDICATIONS: None ANESTHESIA/SEDATION: Fentanyl 25 mcg IV; Versed 0.5 mg IV Sedation Time: 10 minutes; The patient was continuously monitored during  the procedure by the interventional radiology nurse under my direct supervision. COMPLICATIONS: None immediate. PROCEDURE: Informed consent was obtained from the patient following an explanation of the procedure, risks, benefits and alternatives. The patient understands, agrees and consents for the procedure. All questions were addressed. A time out was performed prior to the initiation of the procedure. The patient was positioned prone and non-contrast localization CT was performed of the pelvis to demonstrate the iliac marrow spaces. The operative site was prepped and draped in the usual sterile fashion. Under sterile conditions and local anesthesia, a 22 gauge spinal needle was utilized for procedural planning. Next, an 11 gauge coaxial bone biopsy needle was advanced into the right iliac marrow space. Needle position was confirmed with CT imaging. Initially, a bone marrow aspiration was performed. Next, a bone marrow biopsy was obtained with the 11 gauge outer bone marrow device. Samples were prepared with the cytotechnologist and deemed adequate. The needle was removed and superficial hemostasis was obtained with manual compression. A dressing was applied. The patient tolerated the procedure well without immediate post procedural complication. IMPRESSION: Successful CT guided right iliac bone marrow aspiration and core biopsy. Ruthann Cancer, MD [facility] Electronically Signed   By: Ruthann Cancer MD   On: 10/02/2020 12:55     Medications:   . pamidronate 60 mg (10/03/20 1408)   .  apixaban  2.5 mg Oral BID  . atorvastatin  80 mg Oral Daily  . carvedilol  12.5 mg Oral BID WC  . Chlorhexidine Gluconate Cloth  6 each Topical Q0600  . cinacalcet  60 mg Oral BID WC  . epoetin (EPOGEN/PROCRIT) injection  4,000 Units Intravenous Q T,Th,Sa-HD  . gabapentin  300 mg Oral TID  . midodrine  10 mg Oral TID WC  . sevelamer carbonate  800 mg Oral QID    alum & mag hydroxide-simeth, dextromethorphan-guaiFENesin, hydrALAZINE, hydrocortisone, hydrocortisone cream, ipratropium-albuterol, lip balm, loratadine, morphine injection, Muscle Rub, ondansetron **OR** ondansetron (ZOFRAN) IV, phenol, polyvinyl alcohol, senna-docusate, sodium chloride  Assessment/ Plan:  75 y.o. female with end-stage renal disease, hypertension, gout, morbid obesity, multiple myeloma, rheumatoid arthritis, sleep apnea, hyperlipidemia paroxysmal atrial fibrillation, neuropathy multiple myeloma in remission, was admitted on 09/27/2020 for  Principal Problem:   Effusion of left hip Active Problems:   Morbid obesity ([place])   Essential hypertension   Multiple myeloma (HCC)   HLD (hyperlipidemia)   Rheumatoid arthritis with positive rheumatoid factor (HCC)  Septic arthritis (HCC) [M00.9] Left leg pain [M79.605] Effusion of hip joint, left [M25.452] Effusion of left hip [M25.452]  [facility] TTS/11:00 am/AVF      1)Renal    End-stage renal disease Patient is on hemodialysis. 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 Patient is  non calcium-based binders  5) hypercalcemia Etiology secondary hyperparathyroidism and multiple myeloma Patient is on  Sensipar Patient is also to receive biphosphonate's Patient is On low calcium bath   6) Electrolytes   BMP Latest Ref Rng & Units 10/03/2020 10/02/2020 10/01/2020  Glucose 70 - 99 mg/dL 79 85 85  BUN 8 - 23 mg/dL 53(H) 38(H) 50(H)  Creatinine 0.44 - 1.00 mg/dL 7.45(H) 6.04(H) 7.74(H)  Sodium 135 - 145 mmol/L 135 133(L) 133(L)  Potassium 3.5 - 5.1 mmol/L 5.1 4.3 4.9  Chloride 98 - 111 mmol/L 98 98 94(L)  CO2 22 - 32 mmol/L 26 26 26   Calcium 8.9 - 10.3 mg/dL 11.1(H) 11.4(H) 11.1(H)     Sodium Normonatremic   Potassium Normokalemic    7)Acid base    Co2 at goal  8)   Left hip and foot pain Being followed by the primary team  Plan  We will dialyze today. Patient is to receive IV bisphosphonates today   LOS: 5 Jermery Caratachea s Fabio Wah 2/26/20222:10 PM

## 2020-10-03 NOTE — Progress Notes (Signed)
Occupational Therapy Treatment Patient Details Name: Cassandra Joseph MRN: [id number] DOB: 27-Dec-1945 Today's Date: 10/03/2020    History of present illness 75 y.o. female with medical history significant for end-stage renal disease on hemodialysis M,W,F, HTN, gout, morbid obesity, multiple myeloma, rheumatoid arthritis seropositive, sleep apnea, hyperlipidemia, presents to the emergency department for chief concerns of left hip pain in unable to bear weight after a fall.  Left hip pain s/p fall, moderate size effusion and RA .   OT comments  Pt seen for OT treatment this date to f/u re: safety with self care and functional ROM. OT engages pt in ~15 mins seated g/h tasks with SETUP for washing face and oral care and MIN A with washing, drying under arms and applying deodorant d/t limited ROM to R UE. OT educates pt re: "see-saw method" for washing and drying underarm to offer modifications and has pt participate, but still requiring some assist d/t general pain and some fatigue this date. Will continue to follow. Continue to anticipate pt will require STR upon d/c from acute setting.    Follow Up Recommendations  SNF;Supervision/Assistance - 24 hour    Equipment Recommendations  Other (comment) (defer)    Recommendations for Other Services      Precautions / Restrictions Precautions Precautions: Fall Restrictions Weight Bearing Restrictions: No       Mobility Bed Mobility Overal bed mobility: Needs Assistance Bed Mobility: Supine to Sit;Sit to Supine     Supine to sit: Mod assist;HOB elevated     General bed mobility comments: pt declines to sit EOB citing fatigue. Seated tasks performed this session, performed in high fowler's position    Transfers           Balance                     ADL either performed or assessed with clinical judgement   ADL Overall ADL's : Needs assistance/impaired     Grooming: Wash/dry hands;Wash/dry face;Oral care;Set up;Bed  level;Applying deodorant;Minimal assistance Grooming Details (indicate cue type and reason): high fowler's position, MIN A for applying deodorant d/t limited R UE ROM Upper Body Bathing: Minimal assistance;Bed level Upper Body Bathing Details (indicate cue type and reason): high fowler's, MIN A to assist under R UE, OT educates re: see-saw method                                 Vision Baseline Vision/History: No visual deficits Patient Visual Report: No change from baseline     Perception     Praxis      Cognition Arousal/Alertness: Lethargic Behavior During Therapy: WFL for tasks assessed/performed Overall Cognitive Status: Within Functional Limits for tasks assessed                                          Exercises Other Exercises Other Exercises: OT engages pt in ~15 mins seated g/h tasks with SETUP for washing face and oral care and MIN A with washing, drying under arms and applying deodorant d/t limited ROM to R UE.   Shoulder Instructions       General Comments      Pertinent Vitals/ Pain       Pain Assessment: Faces Faces Pain Scale: Hurts a little bit Pain Location: bottom Pain Descriptors / Indicators: Sore  Pain Intervention(s): Monitored during session  Home Living                      Prior Functioning/Environment              Frequency  Min 1X/week        Progress Toward Goals  OT Goals(current goals can now be found in the care plan section)  Progress towards OT goals: Progressing toward goals  Acute Rehab OT Goals Patient Stated Goal: to move better OT Goal Formulation: With patient Time For Goal Achievement: 10/13/20  Plan Discharge plan remains appropriate    Co-evaluation                 AM-PAC OT "6 Clicks" Daily Activity     Outcome Measure   Help from another person eating meals?: A Little Help from another person taking care of personal grooming?: A Little Help from another  person toileting, which includes using toliet, bedpan, or urinal?: Total Help from another person bathing (including washing, rinsing, drying)?: A Lot Help from another person to put on and taking off regular upper body clothing?: A Lot Help from another person to put on and taking off regular lower body clothing?: Total 6 Click Score: 12    End of Session    OT Visit Diagnosis: Unsteadiness on feet (R26.81);Muscle weakness (generalized) (M62.81);History of falling (Z91.81);Pain Pain - Right/Left: Left Pain - part of body: Hip   Activity Tolerance Patient tolerated treatment well   Patient Left in bed;with call bell/phone within reach;with nursing/sitter in room   Nurse Communication          Time: 9396-8864 OT Time Calculation (min): 23 min  Charges: OT General Charges $OT Visit: 1 Visit OT Treatments $Self Care/Home Management : 23-37 mins  Gerrianne Scale, [place], OTR/L ascom [phone] 10/03/20, 4:42 PM

## 2020-10-04 DIAGNOSIS — M25452 Effusion, left hip: Secondary | ICD-10-CM | POA: Diagnosis not present

## 2020-10-04 LAB — RENAL FUNCTION PANEL
Albumin: 2.7 g/dL — ABNORMAL LOW (ref 3.5–5.0)
Anion gap: 9 (ref 5–15)
BUN: 34 mg/dL — ABNORMAL HIGH (ref 8–23)
CO2: 28 mmol/L (ref 22–32)
Calcium: 10.5 mg/dL — ABNORMAL HIGH (ref 8.9–10.3)
Chloride: 100 mmol/L (ref 98–111)
Creatinine, Ser: 4.91 mg/dL — ABNORMAL HIGH (ref 0.44–1.00)
GFR, Estimated: 9 mL/min — ABNORMAL LOW (ref >60)
Glucose, Bld: 80 mg/dL (ref 70–99)
Phosphorus: 5.3 mg/dL — ABNORMAL HIGH (ref 2.5–4.6)
Potassium: 3.9 mmol/L (ref 3.5–5.1)
Sodium: 137 mmol/L (ref 135–145)

## 2020-10-04 LAB — PTH-RELATED PEPTIDE: PTH-related peptide: <2 pmol/L

## 2020-10-04 LAB — CALCIUM, IONIZED: Calcium, Ionized, Serum: 6.2 mg/dL — ABNORMAL HIGH (ref 4.5–5.6)

## 2020-10-04 NOTE — Progress Notes (Signed)
Cassandra Joseph  LJQ:[id number] DOB: 02-21-1946 DOA: 09/27/2020 PCP: Tracie Harrier, MD    Brief Narrative:  75 year old with a history of ESRD on HD MWF, HTN, gout, morbid obesity, MM, rheumatoid arthritis, sleep apnea, and HLD who presented to [facility] with severe left hip pain and the inability to ambulate/bear weight following a fall 09/18/2020.  CT of the hip at time of admission was negative for an acute fracture.  Aspiration revealed no evidence of crystals but some blood.  Further intervention was not required.  Ultimately physical therapy suggested SNF placement.  Incidentally during her hospital stay the patient was found to have a lytic lesion of the skull worrisome for recurrence of her MM.  Oncology is presently evaluating this issue.  Antimicrobials:  None  DVT prophylaxis: Apixaban  Subjective: Vital signs stable.  Afebrile.  Corrected calcium 11.86 this a.m. no new complaints today. Playing scratch off lottery tickets in bed. Is in good spirits. Continues to feel weak in general. Is looking forward to beginning rehab.  Assessment & Plan:  Idiopathic hypercalcemia Ca2+ has been noted to be persistently modestly elevated during this admit - Oncology investigating potential etiologies, but MM not felt to be active/etiology - bone marrow results pending - HRCT chest w/o worrisome pulmonary findings - pamidronate dosed 2/26 - Ca stable/falling  Left hip posttraumatic effusion with intractable pain Joint effusion confirmed on CT with no evidence of fracture - joint aspirated 2/21 with blood appreciated but no evidence of infection or crystals - PT/OT recommend SNF  Rheumatoid arthritis - impaired ambulation/deconditioning PT/OT recommend SNF placement  Hypodense 1.6 cm posterior left thyroid nodule Noted on HRCT chest - will need thyroid US to further eval - this can be accomplished in the outpt setting   Progressive bilateral lower extremity weakness MRI lumbar spine  revealed no soft tissue mass or severe spinal cord compression  Multiple Myeloma  Lytic lesion noted in the left frontal bone- Oncology directed work-up - skeletal survey negative for abnormal lytic lesions - kappa/lambda light chain ratio slightly abnormal- outpatient PET scan to be pursued - bone marrow biopsy results pending and to be followed up by Oncology   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 fistula Nephrology following   Anemia of CKD Erythropoietin per Nephrology - Hgb stable at ~8.5   HLD Continue atorvastatin  Paroxysmal atrial fibrillation Continue Eliquis and Coreg - HR well controlled   Essential HTN Blood pressure has dropped with HD intermittently - continue midodrine - attempting to stop antihypertension meds  Peripheral neuropathy Continue gabapentin  Sleep apnea Continue nightly CPAP  Disposition If tolerates pamidronate w/o difficulty anticipate d/c to SNF Monday AM   Code Status: FULL CODE Family Communication: Spoke with daughter at bedside at length Status is: Inpatient  Remains inpatient appropriate because:Ongoing diagnostic testing needed not appropriate for outpatient work up   [employer]: The patient is from: Home              Anticipated d/c is to: SNF              Anticipated d/c date is: 1 day              Patient currently is not medically stable to d/c.   Difficult to place patient No  Consultants:  none  Objective: Blood pressure (!) 128/49, pulse (!) 48, temperature 98.8 F (37.1 C), resp. rate 18, height 5' 6"  (1.676 m), weight 117.9 kg, SpO2 100 %.  Intake/Output Summary (Last 24 hours)  at 10/04/2020 0844 Last data filed at 10/03/2020 2115 Gross per 24 hour  Intake 240 ml  Output 1719 ml  Net -1479 ml   Filed Weights   09/27/20 1228  Weight: 117.9 kg    Examination: General: No acute respiratory distress Lungs: CTA B without wheezing Cardiovascular: RRR without murmur Abdomen: NT/ND, soft, overweight,  bs+ Extremities: No signif C/C/E B lower extremities   CBC: Recent Labs  Lab 09/27/20 1539 09/28/20 0528 09/29/20 1119 09/30/20 0510 10/02/20 0506  WBC 7.5   < > 5.1 4.5 5.8  NEUTROABS 5.0  --   --   --  3.4  HGB 11.1*   < > 9.3* 8.5* 9.3*  HCT 36.7   < > 28.9* 27.2* 28.8*  MCV 101.7*   < > 97.6 99.3 98.3  PLT 260   < > 251 212 235   < > = values in this interval not displayed.   Basic Metabolic Panel: Recent Labs  Lab 09/29/20 0445 09/30/20 0510 10/01/20 0509 10/02/20 0506 10/03/20 0459 10/04/20 0457  NA 133* 133*   < > 133* 135 137  K 5.0 4.3   < > 4.3 5.1 3.9  CL 94* 94*   < > 98 98 100  CO2 27 29   < > 26 26 28   GLUCOSE 87 84   < > 85 79 80  BUN 63* 34*   < > 38* 53* 34*  CREATININE 9.30* 6.18*   < > 6.04* 7.45* 4.91*  CALCIUM 10.7* 10.2   < > 11.4* 11.1* 10.5*  MG 2.3 1.9  --   --   --   --   PHOS  --   --    < > 6.4* 7.5* 5.3*   < > = values in this interval not displayed.   GFR: Estimated Creatinine Clearance: 13.3 mL/min (A) (by C-G formula based on SCr of 4.91 mg/dL (H)).  Liver Function Tests: Recent Labs  Lab 09/29/20 0445 09/30/20 0510 10/01/20 0509 10/02/20 0506 10/03/20 0459 10/04/20 0457  AST 8* 9*  --   --   --   --   ALT 6 7  --   --   --   --   ALKPHOS 46 46  --   --   --   --   BILITOT 0.8 0.8  --   --   --   --   PROT 7.0 7.2  --   --   --   --   ALBUMIN 2.8* 2.7* 2.7* 2.8* 2.7* 2.7*    HbA1C: Hgb A1c MFr Bld  Date/Time Value Ref Range Status  08/07/2020 11:07 PM 5.2 4.8 - 5.6 % Final    Comment:    (NOTE) Pre diabetes:          5.7%-6.4%  Diabetes:              >6.4%  Glycemic control for   <7.0% adults with diabetes     CBG: Recent Labs  Lab 09/29/20 0752 09/29/20 1123 09/29/20 1644 09/30/20 0051  GLUCAP 88 102* 77 101*    Recent Results (from the past 240 hour(s))  Resp Panel by RT-PCR (Flu A&B, Covid) Nasopharyngeal Swab     Status: None   Collection Time: 09/27/20  7:46 PM   Specimen: Nasopharyngeal Swab;  Nasopharyngeal(NP) swabs in vial transport medium  Result Value Ref Range Status   SARS Coronavirus 2 by RT PCR NEGATIVE NEGATIVE Final    Comment: (NOTE) SARS-CoV-2 target  nucleic acids are NOT DETECTED.  The SARS-CoV-2 RNA is generally detectable in upper respiratory specimens during the acute phase of infection. The lowest concentration of SARS-CoV-2 viral copies this assay can detect is 138 copies/mL. A negative result does not preclude SARS-Cov-2 infection and should not be used as the sole basis for treatment or other patient management decisions. A negative result may occur with  improper specimen collection/handling, submission of specimen other than nasopharyngeal swab, presence of viral mutation(s) within the areas targeted by this assay, and inadequate number of viral copies(<138 copies/mL). A negative result must be combined with clinical observations, patient history, and epidemiological information. The expected result is Negative.  Fact Sheet for Patients:  EntrepreneurPulse.com.au  Fact Sheet for Healthcare Providers:  IncredibleEmployment.be  This test is no t yet approved or cleared by the [place] FDA and  has been authorized for detection and/or diagnosis of SARS-CoV-2 by FDA under an Emergency Use Authorization (EUA). This EUA will remain  in effect (meaning this test can be used) for the duration of the COVID-19 declaration under Section 564(b)(1) of the Act, 21 U.S.C.section 360bbb-3(b)(1), unless the authorization is terminated  or revoked sooner.       Influenza A by PCR NEGATIVE NEGATIVE Final   Influenza B by PCR NEGATIVE NEGATIVE Final    Comment: (NOTE) The Xpert Xpress SARS-CoV-2/FLU/RSV plus assay is intended as an aid in the diagnosis of influenza from Nasopharyngeal swab specimens and should not be used as a sole basis for treatment. Nasal washings and aspirates are unacceptable for Xpert Xpress  SARS-CoV-2/FLU/RSV testing.  Fact Sheet for Patients: EntrepreneurPulse.com.au  Fact Sheet for Healthcare Providers: IncredibleEmployment.be  This test is not yet approved or cleared by the [place] FDA and has been authorized for detection and/or diagnosis of SARS-CoV-2 by FDA under an Emergency Use Authorization (EUA). This EUA will remain in effect (meaning this test can be used) for the duration of the COVID-19 declaration under Section 564(b)(1) of the Act, 21 U.S.C. section 360bbb-3(b)(1), unless the authorization is terminated or revoked.  Performed at [facility], [address] [place], [place] [address]   Body fluid culture w Gram Stain     Status: None   Collection Time: 09/28/20  8:47 AM   Specimen: Pleura; Body Fluid  Result Value Ref Range Status   Specimen Description   Final    PLEURAL Performed at [facility], [address], [place], [place] [address]    Special Requests   Final    NONE Performed at [facility], [address] [place], [place] [address]    Gram Stain NO WBC SEEN NO ORGANISMS SEEN   Final   Culture   Final    NO GROWTH Performed at [facility], [place] [address], [place], [place] [address]    Report Status 10/02/2020 FINAL  Final     Scheduled Meds: . apixaban  2.5 mg Oral BID  . atorvastatin  80 mg Oral Daily  . carvedilol  12.5 mg Oral BID WC  . Chlorhexidine Gluconate Cloth  6 each Topical Q0600  . cinacalcet  60 mg Oral BID WC  . epoetin (EPOGEN/PROCRIT) injection  4,000 Units Intravenous Q T,Th,Sa-HD  . gabapentin  300 mg Oral TID  . midodrine  10 mg Oral TID WC  . sevelamer carbonate  800 mg Oral QID     LOS: 6 days   Cherene Altes, MD [facility] Office  [phone] Pager - Text  Page per Shea Evans  If 7PM-7AM, please contact night-coverage per Amion 10/04/2020, 8:44 AM

## 2020-10-04 NOTE — Progress Notes (Signed)
[facility], [place] 10/04/20  Subjective:   LOS: 6  Patient known to our practice from outpatient dialysis.   As an outpatient patient dialyzes at [facility] on Tuesday Thursday Saturday schedule  patient's main reason for admission is pain in the left foot and hip.   Patient seen today on the first floor.   Patient resting comfortably in the bed Patient voices no new specific concerns      Objective:  Vital signs in last 24 hours:  Temp:  [97.6 F (36.4 C)-98.8 F (37.1 C)] 98.8 F (37.1 C) (02/27 0547) Pulse Rate:  [37-80] 48 (02/27 0547) Resp:  [16-22] 18 (02/27 0547) BP: (93-142)/(34-75) 128/49 (02/27 0547) SpO2:  [95 %-100 %] 100 % (02/27 0547)  Weight change:  Filed Weights   09/27/20 1228  Weight: 117.9 kg    Intake/Output:    Intake/Output Summary (Last 24 hours) at 10/04/2020 0606 Last data filed at 10/03/2020 2115 Gross per 24 hour  Intake 240 ml  Output 1719 ml  Net -1479 ml    Physical Exam: General:  No acute distress, laying in bed  HEENT  anicteric, moist oral mucous membrane  Pulm/lungs  normal breathing effort, lungs are clear  CVS/Heart  irregular rhythm, no rub or gallop  Abdomen:   Soft, nontender  Extremities:  Trace peripheral edema  Neurologic:  Alert, oriented, able to follow commands  Skin:  No acute rashes  Access- Right arm AV fistula  Basic Metabolic Panel:  Recent Labs  Lab 09/29/20 0445 09/30/20 0510 10/01/20 0509 10/02/20 0506 10/03/20 0459 10/04/20 0457  NA 133* 133* 133* 133* 135 137  K 5.0 4.3 4.9 4.3 5.1 3.9  CL 94* 94* 94* 98 98 100  CO2 _0 GLUCOSE 87 84 85 85 79 80  BUN 63* 34* 50* 38* 53* 34*  CREATININE 9.30* 6.18* 7.74* 6.04* 7.45* 4.91*  CALCIUM 10.7* 10.2 11.1* 11.4* 11.1* 10.5*  MG 2.3 1.9  --   --   --   --   PHOS  --   --  7.1* 6.4* 7.5* 5.3*     CBC: Recent Labs  Lab 09/27/20 1539 09/28/20 0528 09/29/20 0445 09/29/20 1119 09/30/20 0510  10/02/20 0506  WBC 7.5 5.3 4.8 5.1 4.5 5.8  NEUTROABS 5.0  --   --   --   --  3.4  HGB 11.1* 9.6* 8.6* 9.3* 8.5* 9.3*  HCT 36.7 29.4* 27.3* 28.9* 27.2* 28.8*  MCV 101.7* 98.0 98.6 97.6 99.3 98.3  PLT 260 238 230 251 212 235      Lab Results  Component Value Date   HEPBSAG NON REACTIVE 09/29/2020   HEPBSAB Reactive 01/19/2017      Microbiology:  Recent Results (from the past 240 hour(s))  Resp Panel by RT-PCR (Flu A&B, Covid) Nasopharyngeal Swab     Status: None   Collection Time: 09/27/20  7:46 PM   Specimen: Nasopharyngeal Swab; Nasopharyngeal(NP) swabs in vial transport medium  Result Value Ref Range Status   SARS Coronavirus 2 by RT PCR NEGATIVE NEGATIVE Final    Comment: (NOTE) SARS-CoV-2 target nucleic acids are NOT DETECTED.  The SARS-CoV-2 RNA is generally detectable in upper respiratory specimens during the acute phase of infection. The lowest concentration of SARS-CoV-2 viral copies this assay can detect is 138 copies/mL. A negative result does not preclude SARS-Cov-2 infection and should not be used as the sole basis for treatment or other patient management decisions. A  negative result may occur with  improper specimen collection/handling, submission of specimen other than nasopharyngeal swab, presence of viral mutation(s) within the areas targeted by this assay, and inadequate number of viral copies(<138 copies/mL). A negative result must be combined with clinical observations, patient history, and epidemiological information. The expected result is Negative.  Fact Sheet for Patients:  EntrepreneurPulse.com.au  Fact Sheet for Healthcare Providers:  IncredibleEmployment.be  This test is no t yet approved or cleared by the [place] FDA and  has been authorized for detection and/or diagnosis of SARS-CoV-2 by FDA under an Emergency Use Authorization (EUA). This EUA will remain  in effect (meaning this test can be  used) for the duration of the COVID-19 declaration under Section 564(b)(1) of the Act, 21 U.S.C.section 360bbb-3(b)(1), unless the authorization is terminated  or revoked sooner.       Influenza A by PCR NEGATIVE NEGATIVE Final   Influenza B by PCR NEGATIVE NEGATIVE Final    Comment: (NOTE) The Xpert Xpress SARS-CoV-2/FLU/RSV plus assay is intended as an aid in the diagnosis of influenza from Nasopharyngeal swab specimens and should not be used as a sole basis for treatment. Nasal washings and aspirates are unacceptable for Xpert Xpress SARS-CoV-2/FLU/RSV testing.  Fact Sheet for Patients: EntrepreneurPulse.com.au  Fact Sheet for Healthcare Providers: IncredibleEmployment.be  This test is not yet approved or cleared by the [place] FDA and has been authorized for detection and/or diagnosis of SARS-CoV-2 by FDA under an Emergency Use Authorization (EUA). This EUA will remain in effect (meaning this test can be used) for the duration of the COVID-19 declaration under Section 564(b)(1) of the Act, 21 U.S.C. section 360bbb-3(b)(1), unless the authorization is terminated or revoked.  Performed at [facility], [address] [place], [place] [address]   Body fluid culture w Gram Stain     Status: None   Collection Time: 09/28/20  8:47 AM   Specimen: Pleura; Body Fluid  Result Value Ref Range Status   Specimen Description   Final    PLEURAL Performed at [facility], [address], [place], [place] [address]    Special Requests   Final    NONE Performed at [facility], [address] [place], [place] [address]    Gram Stain NO WBC SEEN NO ORGANISMS SEEN   Final   Culture   Final    NO GROWTH Performed at [facility], [place] [address] [place], [place] [address]    Report Status 10/02/2020 FINAL  Final    Coagulation Studies: No results for input(s): LABPROT, INR in the last 72  hours.  Urinalysis: No results for input(s): COLORURINE, LABSPEC, PHURINE, GLUCOSEU, HGBUR, BILIRUBINUR, KETONESUR, PROTEINUR, UROBILINOGEN, NITRITE, LEUKOCYTESUR in the last 72 hours.  Invalid input(s): APPERANCEUR    Imaging: CT Chest High Resolution  Result Date: 10/02/2020 CLINICAL DATA:  Inpatient. Hypercalcemia of uncertain etiology. Concern for sarcoidosis. EXAM: CT CHEST WITHOUT CONTRAST TECHNIQUE: Multidetector CT imaging of the chest was performed following the standard protocol without intravenous contrast. High resolution imaging of the lungs, as well as inspiratory and expiratory imaging, was performed. COMPARISON:  11/09/2016 chest CT angiogram. FINDINGS: Cardiovascular: Moderate to marked cardiomegaly. No significant pericardial effusion/thickening. Three-vessel coronary atherosclerosis. Atherosclerotic nonaneurysmal thoracic aorta. Dilated main pulmonary artery (3.8 cm diameter). Mediastinum/Nodes: Hypodense partially calcified 1.6 cm posterior left thyroid nodule, not definitely seen on prior CT. Focus of coarse calcification in the posterior wall at the esophagogastric junction, unchanged. Otherwise normal esophagus. No axillary adenopathy. Right  paratracheal adenopathy up to 1.1 cm short axis diameter (series 3/image 31), unchanged since 11/09/2016 chest CT. Stable mildly enlarged 1.1 cm subcarinal node (series 3/image 51). No discrete hilar adenopathy on these noncontrast images. Lungs/Pleura: No pneumothorax. No pleural effusion. No significant lobular air trapping or evidence of tracheobronchomalacia on the expiration sequence. Chronic marked elevation of the right hemidiaphragm with chronic platelike consolidation and curvilinear parenchymal bands at right lung base involving the intact right middle lobe with lesser involvement of the right upper and right lower lobes, compatible with a combination passive atelectasis and chronic nonspecific scarring. Mild chronic platelike scarring  versus atelectasis in lingula. Scattered mild interlobular septal thickening in both lungs. No significant regions of subpleural reticulation, traction bronchiectasis, architectural distortion, ground-glass opacity or frank honeycombing. No lung masses or significant pulmonary nodules. Upper abdomen: Chronic coarse central liver calcifications. Atrophic visualized kidneys with confluent innumerable small renal cysts. Musculoskeletal: No aggressive appearing focal osseous lesions. Diffuse sclerosis of the thoracic osseous structures, more pronounced compared to prior chest CT. Moderate thoracic spondylosis. IMPRESSION: 1. Moderate to marked cardiomegaly. Scattered mild interlobular septal thickening, suggesting mild pulmonary edema. No pleural effusions. 2. Dilated main pulmonary artery, suggesting pulmonary arterial hypertension. 3. 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 4. Chronic marked elevation of the right hemidiaphragm with chronic platelike consolidation and curvilinear parenchymal bands at the right lung base, compatible with a combination of passive atelectasis and chronic nonspecific scarring. No evidence of interstitial lung disease. 5. Nonspecific mild mediastinal lymphadenopathy, stable since 11/09/2016 chest CT, most compatible with benign reactive adenopathy. 6. Hypodense 1.6 cm posterior left thyroid nodule, not definitely seen on prior CT. Recommend thyroid US (ref: J Am Coll Radiol. 2015 Feb;12(2): 143-50). 7. Aortic Atherosclerosis (ICD10-I70.0). Electronically Signed   By: Ilona Sorrel M.D.   On: 10/02/2020 12:34   CT BONE MARROW BIOPSY  Result Date: 10/02/2020 INDICATION: 75 year old female with history of multiple myeloma. EXAM: CT-GUIDED BONE MARROW BIOPSY AND ASPIRATION MEDICATIONS: None ANESTHESIA/SEDATION: Fentanyl 25 mcg IV; Versed  0.5 mg IV Sedation Time: 10 minutes; The patient was continuously monitored during the procedure by the interventional radiology nurse under my direct supervision. COMPLICATIONS: None immediate. PROCEDURE: Informed consent was obtained from the patient following an explanation of the procedure, risks, benefits and alternatives. The patient understands, agrees and consents for the procedure. All questions were addressed. A time out was performed prior to the initiation of the procedure. The patient was positioned prone and non-contrast localization CT was performed of the pelvis to demonstrate the iliac marrow spaces. The operative site was prepped and draped in the usual sterile fashion. Under sterile conditions and local anesthesia, a 22 gauge spinal needle was utilized for procedural planning. Next, an 11 gauge coaxial bone biopsy needle was advanced into the right iliac marrow space. Needle position was confirmed with CT imaging. Initially, a bone marrow aspiration was performed. Next, a bone marrow biopsy was obtained with the 11 gauge outer bone marrow device. Samples were prepared with the cytotechnologist and deemed adequate. The needle was removed and superficial hemostasis was obtained with manual compression. A dressing was applied. The patient tolerated the procedure well without immediate post procedural complication. IMPRESSION: Successful CT guided right iliac bone marrow aspiration and core biopsy. Ruthann Cancer, MD [facility] Electronically Signed   By: Ruthann Cancer MD   On: 10/02/2020 12:55  Medications:    . apixaban  2.5 mg Oral BID  . atorvastatin  80 mg Oral Daily  . carvedilol  12.5 mg Oral BID WC  . Chlorhexidine Gluconate Cloth  6 each Topical Q0600  . cinacalcet  60 mg Oral BID WC  . epoetin (EPOGEN/PROCRIT) injection  4,000 Units Intravenous Q T,Th,Sa-HD  . gabapentin  300 mg Oral TID  . midodrine  10 mg Oral TID  WC  . sevelamer carbonate  800 mg Oral QID   alum & mag hydroxide-simeth, dextromethorphan-guaiFENesin, hydrALAZINE, hydrocortisone, hydrocortisone cream, ipratropium-albuterol, lip balm, loratadine, morphine injection, Muscle Rub, ondansetron **OR** ondansetron (ZOFRAN) IV, phenol, polyvinyl alcohol, senna-docusate, sodium chloride, zolpidem  Assessment/ Plan:  75 y.o. female with end-stage renal disease, hypertension, gout, morbid obesity, multiple myeloma, rheumatoid arthritis, sleep apnea, hyperlipidemia paroxysmal atrial fibrillation, neuropathy multiple myeloma in remission, was admitted on 09/27/2020 for  Principal Problem:   Effusion of left hip Active Problems:   Morbid obesity ([place])   Essential hypertension   Multiple myeloma (HCC)   HLD (hyperlipidemia)   Rheumatoid arthritis with positive rheumatoid factor (HCC)  Septic arthritis (HCC) [M00.9] Left leg pain [M79.605] Effusion of hip joint, left [M25.452] Effusion of left hip [M25.452]  [facility] / TTS/11:00 am/AVF      1)Renal    End-stage renal disease Patient is on hemodialysis. 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 Patient is  non calcium-based binders  5)  hypercalcemia Etiology secondary hyperparathyroidism and multiple myeloma Patient is on Sensipar Patient is also to receive biphosphonate's  Patient calcium is now better   6) Electrolytes   BMP Latest Ref Rng & Units 10/04/2020 10/03/2020 10/02/2020  Glucose 70 - 99 mg/dL 80 79 85  BUN 8 - 23 mg/dL 34(H) 53(H) 38(H)  Creatinine 0.44 - 1.00 mg/dL 4.91(H) 7.45(H) 6.04(H)  Sodium 135 - 145 mmol/L 137 135 133(L)  Potassium 3.5 - 5.1 mmol/L 3.9 5.1 4.3  Chloride 98 - 111 mmol/L 100 98 98  CO2 22 - 32 mmol/L _0 Calcium 8.9 - 10.3 mg/dL 10.5(H) 11.1(H) 11.4(H)     Sodium Normonatremic   Potassium Normokalemic    7)Acid base    Co2 at goal  8)   Left hip and foot pain Being followed by the primary team  Plan  No need for renal placement therapy today We will continue the current treatment   LOS: 6 Chief Walkup s [facility] 2/27/20226:06 AM

## 2020-10-04 NOTE — Progress Notes (Signed)
HD telephone report indicated 1/2 liter removed then treatment stopped due to decrease in BP

## 2020-10-04 NOTE — Progress Notes (Signed)
Pt transferred from HD back to Room 109 by orderly

## 2020-10-05 ENCOUNTER — Other Ambulatory Visit: Payer: Self-pay | Admitting: Internal Medicine

## 2020-10-05 ENCOUNTER — Telehealth: Payer: Self-pay | Admitting: Internal Medicine

## 2020-10-05 DIAGNOSIS — M25452 Effusion, left hip: Secondary | ICD-10-CM | POA: Diagnosis not present

## 2020-10-05 DIAGNOSIS — I5033 Acute on chronic diastolic (congestive) heart failure: Secondary | ICD-10-CM

## 2020-10-05 LAB — CBC
HCT: 28.4 % — ABNORMAL LOW (ref 36.0–46.0)
Hemoglobin: 8.7 g/dL — ABNORMAL LOW (ref 12.0–15.0)
MCH: 30.6 pg (ref 26.0–34.0)
MCHC: 30.6 g/dL (ref 30.0–36.0)
MCV: 100 fL (ref 80.0–100.0)
Platelets: 300 10*3/uL (ref 150–400)
RBC: 2.84 MIL/uL — ABNORMAL LOW (ref 3.87–5.11)
RDW: 14.9 % (ref 11.5–15.5)
WBC: 5.3 10*3/uL (ref 4.0–10.5)
nRBC: 0 % (ref 0.0–0.2)

## 2020-10-05 LAB — RENAL FUNCTION PANEL
Albumin: 2.8 g/dL — ABNORMAL LOW (ref 3.5–5.0)
Anion gap: 13 (ref 5–15)
BUN: 46 mg/dL — ABNORMAL HIGH (ref 8–23)
CO2: 25 mmol/L (ref 22–32)
Calcium: 11 mg/dL — ABNORMAL HIGH (ref 8.9–10.3)
Chloride: 98 mmol/L (ref 98–111)
Creatinine, Ser: 6.65 mg/dL — ABNORMAL HIGH (ref 0.44–1.00)
GFR, Estimated: 6 mL/min — ABNORMAL LOW (ref >60)
Glucose, Bld: 73 mg/dL (ref 70–99)
Phosphorus: 6.6 mg/dL — ABNORMAL HIGH (ref 2.5–4.6)
Potassium: 4.6 mmol/L (ref 3.5–5.1)
Sodium: 136 mmol/L (ref 135–145)

## 2020-10-05 LAB — RESP PANEL BY RT-PCR (FLU A&B, COVID) ARPGX2
Influenza A by PCR: NEGATIVE
Influenza B by PCR: NEGATIVE
SARS Coronavirus 2 by RT PCR: NEGATIVE

## 2020-10-05 MED ORDER — EPOETIN ALFA 4000 UNIT/ML IJ SOLN: 4000.0000 [IU] | INTRAMUSCULAR | Status: AC

## 2020-10-05 MED ORDER — POLYETHYLENE GLYCOL 3350 17 G PO PACK: 17.0000 g | PACK | Freq: Two times a day (BID) | ORAL | 0 refills | Status: AC

## 2020-10-05 MED ORDER — CARVEDILOL 12.5 MG PO TABS: 12.5000 mg | ORAL_TABLET | Freq: Two times a day (BID) | ORAL | Status: DC

## 2020-10-05 MED ORDER — POLYETHYLENE GLYCOL 3350 17 G PO PACK
17.0000 g | PACK | Freq: Two times a day (BID) | ORAL | Status: DC
  Administered 2020-10-05: 10:00:00 17 g via ORAL
  Filled 2020-10-05: qty 1

## 2020-10-05 MED ORDER — BISACODYL 10 MG RE SUPP: 10.0000 mg | Freq: Every day | RECTAL | 0 refills | Status: DC | PRN

## 2020-10-05 MED ORDER — BISACODYL 10 MG RE SUPP
10.0000 mg | Freq: Every day | RECTAL | Status: DC | PRN
  Administered 2020-10-05: 10 mg via RECTAL
  Filled 2020-10-05: qty 1

## 2020-10-05 MED ORDER — DOCUSATE SODIUM 283 MG RE ENEM
1.0000 | ENEMA | Freq: Every day | RECTAL | Status: DC | PRN
  Filled 2020-10-05: qty 1

## 2020-10-05 MED ORDER — SENNOSIDES-DOCUSATE SODIUM 8.6-50 MG PO TABS
1.0000 | ORAL_TABLET | Freq: Two times a day (BID) | ORAL | Status: DC
  Administered 2020-10-05: 1 via ORAL
  Filled 2020-10-05: qty 1

## 2020-10-05 MED ORDER — LACTULOSE 10 GM/15ML PO SOLN
30.0000 g | Freq: Every day | ORAL | Status: DC | PRN
  Administered 2020-10-05: 10:00:00 30 g via ORAL
  Filled 2020-10-05: qty 60

## 2020-10-05 NOTE — Telephone Encounter (Signed)
C-schedule follow-up on march 4th- MD; labs- cbc/cmp; possible denosumab. GB

## 2020-10-05 NOTE — Progress Notes (Signed)
Cassandra Joseph   DOB:Nov 15, 1945   XB#:[id number]    Subjective: Patient complains of constipation.  Has not had bowel movement for the last 3 days.  Patient states that she has been getting laxatives without any significant benefit.  She is interested in enema.  Objective:  Vitals:   10/05/20 0824 10/05/20 1223  BP: (!) 145/55 (!) 151/64  Pulse: 72 61  Resp: 18 15  Temp:  98.5 F (36.9 C)  SpO2: (!) 88% 99%     Intake/Output Summary (Last 24 hours) at 10/05/2020 2200 Last data filed at 10/05/2020 1035 Gross per 24 hour  Intake 0 ml  Output --  Net 0 ml    Physical Exam Constitutional:      Comments: Resting in the bed.  No acute distress.  HENT:     Head: Normocephalic and atraumatic.     Mouth/Throat:     Mouth: Oropharynx is clear and moist.     Pharynx: No oropharyngeal exudate.  Eyes:     Pupils: Pupils are equal, round, and reactive to light.  Cardiovascular:     Rate and Rhythm: Normal rate and regular rhythm.  Pulmonary:     Effort: No respiratory distress.     Breath sounds: No wheezing.     Comments: Decreased air entry bilaterally. Abdominal:     General: Bowel sounds are normal. There is no distension.     Palpations: Abdomen is soft. There is no mass.     Tenderness: There is no abdominal tenderness. There is no guarding or rebound.  Musculoskeletal:        General: No tenderness or edema. Normal range of motion.     Cervical back: Normal range of motion and neck supple.  Skin:    General: Skin is warm.  Neurological:     Mental Status: She is alert and oriented to person, place, and time.  Psychiatric:        Mood and Affect: Affect normal.     Labs:  Lab Results  Component Value Date   WBC 5.3 10/05/2020   HGB 8.7 (L) 10/05/2020   HCT 28.4 (L) 10/05/2020   MCV 100.0 10/05/2020   PLT 300 10/05/2020   NEUTROABS 3.4 10/02/2020    Lab Results  Component Value Date   NA 136 10/05/2020   K 4.6 10/05/2020   CL 98 10/05/2020   CO2 25  10/05/2020    Studies:  No results found.  Multiple myeloma [facility]) #75 year old female patient with multiple medical problems including end-stage renal disease on dialysis; history of multiple myeloma-is currently admitted hospital for knee pain/effusion/incidentally noted to have lytic lesion in the skull.  #History of multiple myeloma-s/p Velcade dexamethasone treatment in 2007.  At this admission 8 mm lytic lesion noted on the skull-however skeletal survey negative for any abnormal lytic lesions.  Serum myeloma panel shows M protein 1.2 g; kappa/lambda light chain ratio slightly abnormal.  Based on myeloma panel/negative skeletal survey clinically less likely to be because of hypercalcemia.  Status post bone marrow biopsy-awaiting pathology.  #Hypercalcemia calcium between 11 and 12-unclear etiology.  Status post pamidronate 60 mg over 6 hours on 2/25.  Calcium again today 11.  #End-stage renal disease/anemia [Dr.Singh]  #Joint effusion s/p aspiration question secondary to right arthritis.  Defer to primary team.  #Constipation-defer to primary team regarding enema.  #We will make plans for follow-up later in the week to review the results of the bone marrow biopsy.   Cammie Sickle, MD  10/05/2020  10:00 PM

## 2020-10-05 NOTE — Progress Notes (Signed)
Melissa-please  schedule follow up on march 8th-MD; labs- cbc/cmp; denosumab SQ.Dx: multiple myeloma. Thanks,  GB

## 2020-10-05 NOTE — Progress Notes (Signed)
[facility], [place] 10/05/20  Subjective:   LOS: 7  Patient known to our practice from outpatient dialysis.  She dialyzes at [facility] on Monday Wednesday and Friday shift. Last dialysis was on 14th February.  Patient's main reason for admission is pain in the left foot and hip.    Patient seen resting in bed Alert and oriented Able to tolerate meals Denies nausea Denies shortness of breath   Objective:  Vital signs in last 24 hours:  Temp:  [97.9 F (36.6 C)-98.7 F (37.1 C)] 98.5 F (36.9 C) (02/28 0821) Pulse Rate:  [56-72] 72 (02/28 0824) Resp:  [16-18] 18 (02/28 0824) BP: (129-154)/(33-63) 145/55 (02/28 0824) SpO2:  [84 %-100 %] 88 % (02/28 0824)  Weight change:  Filed Weights   09/27/20 1228  Weight: 117.9 kg    Intake/Output:    Intake/Output Summary (Last 24 hours) at 10/05/2020 0941 Last data filed at 10/04/2020 1500 Gross per 24 hour  Intake 480 ml  Output -  Net 480 ml    Physical Exam: General:  No acute distress, laying in bed  HEENT  anicteric, moist oral mucous   Pulm/lungs  normal breathing effort, lungs are clear  CVS/Heart  regular rhythm  Abdomen:   Soft, nontender  Extremities:  + peripheral edema  Neurologic:  Alert, oriented, able to follow commands  Skin:  No acute rashes  Right arm AV fistula  Basic Metabolic Panel:  Recent Labs  Lab 09/29/20 0445 09/30/20 0510 10/01/20 0509 10/02/20 0506 10/03/20 0459 10/04/20 0457 10/05/20 0652  NA 133* 133* 133* 133* 135 137 136  K 5.0 4.3 4.9 4.3 5.1 3.9 4.6  CL 94* 94* 94* 98 98 100 98  CO2 27 29 26 26 26 28 25   GLUCOSE 87 84 85 85 79 80 73  BUN 63* 34* 50* 38* 53* 34* 46*  CREATININE 9.30* 6.18* 7.74* 6.04* 7.45* 4.91* 6.65*  CALCIUM 10.7* 10.2 11.1* 11.4* 11.1* 10.5* 11.0*  MG 2.3 1.9  --   --   --   --   --   PHOS  --   --  7.1* 6.4* 7.5* 5.3* 6.6*     CBC: Recent Labs  Lab 09/29/20 0445 09/29/20 1119 09/30/20 0510 10/02/20 0506  10/05/20 0652  WBC 4.8 5.1 4.5 5.8 5.3  NEUTROABS  --   --   --  3.4  --   HGB 8.6* 9.3* 8.5* 9.3* 8.7*  HCT 27.3* 28.9* 27.2* 28.8* 28.4*  MCV 98.6 97.6 99.3 98.3 100.0  PLT 230 251 212 235 300      Lab Results  Component Value Date   HEPBSAG NON REACTIVE 09/29/2020   HEPBSAB Reactive 01/19/2017      Microbiology:  Recent Results (from the past 240 hour(s))  Resp Panel by RT-PCR (Flu A&B, Covid) Nasopharyngeal Swab     Status: None   Collection Time: 09/27/20  7:46 PM   Specimen: Nasopharyngeal Swab; Nasopharyngeal(NP) swabs in vial transport medium  Result Value Ref Range Status   SARS Coronavirus 2 by RT PCR NEGATIVE NEGATIVE Final    Comment: (NOTE) SARS-CoV-2 target nucleic acids are NOT DETECTED.  The SARS-CoV-2 RNA is generally detectable in upper respiratory specimens during the acute phase of infection. The lowest concentration of SARS-CoV-2 viral copies this assay can detect is 138 copies/mL. A negative result does not preclude SARS-Cov-2 infection and should not be used as the sole basis for treatment or other patient management decisions. A negative result  may occur with  improper specimen collection/handling, submission of specimen other than nasopharyngeal swab, presence of viral mutation(s) within the areas targeted by this assay, and inadequate number of viral copies(<138 copies/mL). A negative result must be combined with clinical observations, patient history, and epidemiological information. The expected result is Negative.  Fact Sheet for Patients:  EntrepreneurPulse.com.au  Fact Sheet for Healthcare Providers:  IncredibleEmployment.be  This test is no t yet approved or cleared by the [place] FDA and  has been authorized for detection and/or diagnosis of SARS-CoV-2 by FDA under an Emergency Use Authorization (EUA). This EUA will remain  in effect (meaning this test can be used) for the duration of  the COVID-19 declaration under Section 564(b)(1) of the Act, 21 U.S.C.section 360bbb-3(b)(1), unless the authorization is terminated  or revoked sooner.       Influenza A by PCR NEGATIVE NEGATIVE Final   Influenza B by PCR NEGATIVE NEGATIVE Final    Comment: (NOTE) The Xpert Xpress SARS-CoV-2/FLU/RSV plus assay is intended as an aid in the diagnosis of influenza from Nasopharyngeal swab specimens and should not be used as a sole basis for treatment. Nasal washings and aspirates are unacceptable for Xpert Xpress SARS-CoV-2/FLU/RSV testing.  Fact Sheet for Patients: EntrepreneurPulse.com.au  Fact Sheet for Healthcare Providers: IncredibleEmployment.be  This test is not yet approved or cleared by the [place] FDA and has been authorized for detection and/or diagnosis of SARS-CoV-2 by FDA under an Emergency Use Authorization (EUA). This EUA will remain in effect (meaning this test can be used) for the duration of the COVID-19 declaration under Section 564(b)(1) of the Act, 21 U.S.C. section 360bbb-3(b)(1), unless the authorization is terminated or revoked.  Performed at [facility], [address] [place], [place] [address]   Body fluid culture w Gram Stain     Status: None   Collection Time: 09/28/20  8:47 AM   Specimen: Pleura; Body Fluid  Result Value Ref Range Status   Specimen Description   Final    PLEURAL Performed at [facility], [address], [place], [place] [address]    Special Requests   Final    NONE Performed at [facility], [address] [place], [place] [address]    Gram Stain NO WBC SEEN NO ORGANISMS SEEN   Final   Culture   Final    NO GROWTH Performed at [facility], [place] [address], [place],  [address]    Report Status 10/02/2020 FINAL  Final    Coagulation Studies: No results for input(s): LABPROT, INR in the last 72 hours.  Urinalysis: No results for  input(s): COLORURINE, LABSPEC, PHURINE, GLUCOSEU, HGBUR, BILIRUBINUR, KETONESUR, PROTEINUR, UROBILINOGEN, NITRITE, LEUKOCYTESUR in the last 72 hours.  Invalid input(s): APPERANCEUR    Imaging: No results found.   Medications:    . apixaban  2.5 mg Oral BID  . atorvastatin  80 mg Oral Daily  . carvedilol  12.5 mg Oral BID WC  . Chlorhexidine Gluconate Cloth  6 each Topical Q0600  . cinacalcet  60 mg Oral BID WC  . epoetin (EPOGEN/PROCRIT) injection  4,000 Units Intravenous Q T,Th,Sa-HD  . gabapentin  300 mg Oral TID  . midodrine  10 mg Oral TID WC  . polyethylene glycol  17 g Oral BID  . senna-docusate  1 tablet Oral BID  . sevelamer carbonate  800 mg Oral QID   alum & mag hydroxide-simeth, bisacodyl, dextromethorphan-guaiFENesin, hydrALAZINE, hydrocortisone, hydrocortisone cream, ipratropium-albuterol, lactulose, lip balm, loratadine,  morphine injection, Muscle Rub, ondansetron **OR** ondansetron (ZOFRAN) IV, phenol, polyvinyl alcohol, sodium chloride, zolpidem  Assessment/ Plan:  75 y.o. female with end-stage renal disease, hypertension, gout, morbid obesity, multiple myeloma, rheumatoid arthritis, sleep apnea, hyperlipidemia paroxysmal atrial fibrillation, neuropathy multiple myeloma in remission, was admitted on 09/27/2020 for  Principal Problem:   Effusion of left hip Active Problems:   Morbid obesity ([place])   Essential hypertension   Multiple myeloma (HCC)   HLD (hyperlipidemia)   Rheumatoid arthritis with positive rheumatoid factor (HCC)  Septic arthritis (HCC) [M00.9] Left leg pain [M79.605] Effusion of hip joint, left [M25.452] Effusion of left hip [M25.452]  [facility]11:00 am/AVF  #. ESRD Last dialysis treatment on Saturday Next treatment will be tomorrow. No acute need for dialysis today  #. Anemia of CKD  Lab Results  Component Value Date   HGB 8.7 (L) 10/05/2020   EPO 4000 units with dialysis Bone marrow biopsy on 10/02/20 for  myeloma workup  #. Secondary hyperparathyroidism of renal origin N 25.81      Component Value Date/Time   PTH 242 (H) 09/29/2020 1119   Lab Results  Component Value Date   PHOS 6.6 (H) 10/05/2020   Monitoring these labs and will determine if binders are appropriate   #.  Left hip and foot pain Aspiration of left hip negative for concerning agents Pain management in place    LOS: [place] 2/28/20229:41 [place], [place]

## 2020-10-05 NOTE — Discharge Instructions (Signed)
Hypercalcemia Hypercalcemia is when the level of calcium in a person's blood is above normal. The body needs calcium to make bones and keep them strong. Calcium also helps the muscles, nerves, brain, and heart work the way they should. Most of the calcium in the body is in the bones. There is also some calcium in the blood. Hypercalcemia can happen when calcium comes out of the bones, or when the kidneys are not able to remove calcium from the blood. Hypercalcemia can be mild or severe. What are the causes? There are many possible causes of hypercalcemia. Common causes of this condition include:  Hyperparathyroidism. This is a condition in which the body produces too much parathyroid hormone. There are four parathyroid glands in your neck. These glands produce a chemical messenger (hormone) that helps the body absorb calcium from foods and helps your bones release calcium.  Certain kinds of cancer. Less common causes of hypercalcemia include:  Getting too much calcium or vitamin D from your diet.  Kidney failure.  Hyperthyroidism.  Severe dehydration.  Being on bed rest or being inactive for a long time.  Certain medicines.  Infections. What increases the risk? You are more likely to develop this condition if you:  Are female.  Are 60 years of age or older.  Have a family history of hypercalcemia. What are the signs or symptoms? Mild hypercalcemia that starts slowly may not cause symptoms. Severe, sudden hypercalcemia is more likely to cause symptoms, such as:  Being more thirsty than usual.  Needing to urinate more often than usual.  Abdominal pain.  Nausea and vomiting.  Constipation.  Muscle pain, twitching, or weakness.  Feeling very tired. How is this diagnosed? Hypercalcemia is usually diagnosed with a blood test. You may also have tests to help determine what is causing this condition, such as imaging tests and more blood tests.   How is this  treated? Treatment for hypercalcemia depends on the cause. Treatment may include:  Receiving fluids through an IV.  Medicines that: ? Keep calcium levels steady after receiving fluids (loop diuretics). ? Keep calcium in your bones (bisphosphonates). ? Lower the calcium level in your blood.  Surgery to remove overactive parathyroid glands.  A procedure that filters your blood to correct calcium levels (hemodialysis). Follow these instructions at home:  Take over-the-counter and prescription medicines only as told by your health care provider.  Follow instructions from your health care provider about eating or drinking restrictions.  Drink enough fluid to keep your urine pale yellow.  Stay active. Weight-bearing exercise helps to keep calcium in your bones. Follow instructions from your health care provider about what type and level of exercise is safe for you.  Keep all follow-up visits as told by your health care provider. This is important.   Contact a health care provider if you have:  A fever.  A heartbeat that is irregular or very fast.  Changes in mood, memory, or personality. Get help right away if you:  Have severe abdominal pain.  Have chest pain.  Have trouble breathing.  Become very confused and sleepy.  Lose consciousness. Summary  Hypercalcemia is when the level of calcium in a person's blood is above normal. The body needs calcium to make bones and keep them strong. Calcium also helps the muscles, nerves, brain, and heart work the way they should.  There are many possible causes of hypercalcemia, and treatment depends on the cause.  Take over-the-counter and prescription medicines only as told by your   health care provider.  Follow instructions from your health care provider about eating or drinking restrictions. This information is not intended to replace advice given to you by your health care provider. Make sure you discuss any questions you have with  your health care provider. Document Revised: 08/21/2018 Document Reviewed: 04/30/2018 Elsevier Patient Education  2021 Elsevier Inc.  

## 2020-10-05 NOTE — TOC Transition Note (Signed)
Transition of Care [facility]) - CM/SW Discharge Note   Patient Details  Name: Cassandra Joseph MRN: [id number] Date of Birth: August 07, 1946  Transition of Care [facility]) CM/SW Contact:  Shelbie Hutching, RN Phone Number: 10/05/2020, 12:16 PM   Clinical Narrative:    Patient is medically cleared for discharge to SNF, [facility] room 2B.  Patient's daughter updated on plan for discharge today.  Bedside RN will call report to [phone].  EMS has been arranged, patient is 3rd on the list for pick up.    Final next level of care: Skilled Nursing Facility Barriers to Discharge: Barriers Resolved   Patient Goals and CMS Choice Patient states their goals for this hospitalization and ongoing recovery are:: would like to go back to rehab CMS Medicare.gov Compare Post Acute Care list provided to:: Patient Choice offered to / list presented to : Patient  Discharge Placement   Existing PASRR number confirmed : 09/29/20          Patient [facility] at: [facility] Patient to be transferred to facility by: [facility] Name of family member notified: Pam Patient and family notified of of transfer: 10/05/20  Discharge Plan and Services   Discharge Planning Services: CM Consult Post Acute Care Choice: [place]          DME Arranged: N/A DME Agency: NA                  Social Determinants of Health ([place]) Interventions     Readmission Risk Interventions Readmission Risk Prevention Plan 08/11/2020 06/25/2019 05/14/2019  Transportation Screening Complete Complete Complete  PCP or Specialist Appt within 3-5 Days Complete Complete Complete  HRI or Home Care Consult Complete Complete Complete  Social Work Consult for [place] Planning/Counseling Complete Complete Complete  Palliative Care Screening Not Applicable Not Applicable Not Applicable  Medication Review Press photographer) Complete Complete Complete  Some recent data might be hidden

## 2020-10-05 NOTE — Discharge Summary (Signed)
DISCHARGE SUMMARY  FLORNCE RECORD  MR#: [id number]  DOB:January 19, 1946  Date of Admission: 09/27/2020 Date of Discharge: 10/05/2020  Attending Physician:Jeffrey Hennie Duos, MD  Patient's GNO:IBBCW, Cherlyn Labella, MD  Consults: Nephrology  Oncology  Disposition: D/C to SNF    Follow-up Appts:  Contact information for follow-up providers    Cammie Sickle, MD Follow up.   Specialties: Internal Medicine, Oncology Why: The office will direct you on follow up timing.  Contact information: [place] [id number] [phone]            Contact information for after-discharge care    [facility] .   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 CT of the hip at time of admission was negative for an acute fracture.  Aspiration revealed no evidence of crystals but some blood.  Further intervention was not required.   Ultimately physical therapy suggested SNF placement.  Incidentally during her hospital stay the patient was found to have a lytic lesion of the skull worrisome for recurrence of her MM.  Oncology is presently evaluating this issue.  Hospital Course:  Idiopathic hypercalcemia Ca2+ has been noted to be persistently modestly elevated during this admit - Oncology investigating potential etiologies, but MM not felt to be active/etiology - bone marrow results pending and to be followed by Oncology - HRCT chest w/o worrisome pulmonary findings - pamidronate dosed 2/26 - Ca stable/falling at time of d/c   Left hip posttraumatic effusion with intractable pain Joint effusion confirmed on CT with no evidence of fracture - joint aspirated 2/21 with blood appreciated but no evidence of infection or crystals - PT/OT recommend SNF - needs ongoing rehab to regain ambulatory status   Rheumatoid arthritis - impaired ambulation/deconditioning PT/OT recommend SNF placement - needs ongoing rehab to regain ambulatory status  Hypodense 1.6 cm posterior left thyroid nodule Noted on HRCT chest - will need thyroid US to further eval - this can be accomplished in the outpt setting - TSH normal   Progressive bilateral lower extremity weakness MRI lumbar spine revealed no soft tissue mass or severe spinal cord compression - felt to be due to severe deconditioned status   Multiple Myeloma  Lytic lesion noted in the left frontal bone- Oncology directedwork-up -skeletal surveynegative for abnormal lytic lesions -kappa/lambda light chain ratio slightly abnormal- outpatient PET scan to be pursued - bone marrow biopsyresults pending and to be followed up by Oncology after d/c   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 fistula Nephrology followed th/o hospital stay  Anemia of CKD Erythropoietin perNephrology- Hgb stable at ~8.5 -9.3  HLD Continue atorvastatin  Paroxysmal atrial fibrillation Continue  Eliquis and Coreg- HR well controlled   Essential HTN Blood pressure has dropped with HD intermittently - continue midodrine - stopped many antihypertension meds - follow trend   Peripheral neuropathy Continue gabapentin  Sleep apnea Continue nightly CPAP   Allergies as of 10/05/2020      Reactions   Shellfish Allergy Shortness Of Breath      Medication List    STOP taking these medications   aspirin EC 81 MG tablet   torsemide 20 MG tablet Commonly known as: DEMADEX     TAKE these medications   apixaban 2.5 MG Tabs tablet Commonly known as: ELIQUIS Take 1 tablet (2.5 mg total) by mouth 2 (two) times daily.   atorvastatin 80 MG tablet Commonly known as: LIPITOR Take 1 tablet (80 mg total) by mouth daily.   b complex-vitamin c-folic acid 0.8 MG Tabs tablet Take 1 tablet by mouth daily.   bisacodyl 10 MG suppository Commonly known as: DULCOLAX Place 1 suppository (10 mg total) rectally daily as needed for moderate constipation.   carvedilol 12.5 MG tablet Commonly known as: COREG Take 1 tablet (12.5 mg total) by mouth 2 (two) times daily with a meal. What changed:   medication strength  how much to take  when to take this   cinacalcet 60 MG tablet Commonly known as: SENSIPAR Take 60 mg by mouth 2 (two) times daily.   epoetin alfa 4000 UNIT/ML injection Commonly known as: EPOGEN Inject 1 mL (4,000 Units total) into the vein Every Tuesday,Thursday,and Saturday with dialysis.   gabapentin 300 MG capsule Commonly known as: NEURONTIN Take 300 mg by mouth 3 (three) times daily.   hydroxychloroquine 200 MG tablet Commonly known as: PLAQUENIL Take 200 mg by mouth daily.   midodrine 10 MG tablet Commonly known as: PROAMATINE Take 1 tablet (10 mg total) by mouth 3 (three) times daily with meals.   polyethylene glycol 17 g packet Commonly known as: MIRALAX / GLYCOLAX Take 17 g by mouth 2 (two) times daily.   sevelamer carbonate 800 MG  tablet Commonly known as: RENVELA Take 800 mg by mouth 4 (four) times daily.       Day of Discharge BP (!) 145/55 (BP Location: Left Arm)   Pulse 72   Temp 98.5 F (36.9 C) (Oral)   Resp 18   Ht 5' 6"  (1.676 m)   Wt 117.9 kg   SpO2 (!) 88%   BMI 41.97 kg/m   Physical Exam: General: No acute respiratory distress Lungs: Clear to auscultation bilaterally without wheezes or crackles Cardiovascular: Regular rate and rhythm without murmur gallop or rub normal S1 and S2 Abdomen: Nontender, nondistended, soft, bowel sounds positive, no rebound, no ascites, no appreciable mass Extremities: No significant cyanosis, clubbing, or edema bilateral lower extremities  Basic Metabolic Panel: Recent Labs  Lab 09/29/20 0445 09/30/20 0510 10/01/20 0509 10/02/20 0506 10/03/20 0459 10/04/20 0457 10/05/20 0652  NA 133* 133* 133* 133* 135 137 136  K 5.0 4.3 4.9 4.3 5.1 3.9 4.6  CL 94* 94* 94* 98 98 100 98  CO2 27 29 26 26 26 28 25   GLUCOSE 87 84 85 85 79 80 73  BUN 63* 34* 50* 38* 53* 34* 46*  CREATININE 9.30* 6.18* 7.74* 6.04* 7.45* 4.91* 6.65*  CALCIUM 10.7* 10.2 11.1* 11.4* 11.1* 10.5* 11.0*  MG 2.3 1.9  --   --   --   --   --  PHOS  --   --  7.1* 6.4* 7.5* 5.3* 6.6*    Liver Function Tests: Recent Labs  Lab 09/29/20 0445 09/30/20 0510 10/01/20 0509 10/02/20 0506 10/03/20 0459 10/04/20 0457 10/05/20 0652  AST 8* 9*  --   --   --   --   --   ALT 6 7  --   --   --   --   --   ALKPHOS 46 46  --   --   --   --   --   BILITOT 0.8 0.8  --   --   --   --   --   PROT 7.0 7.2  --   --   --   --   --   ALBUMIN 2.8* 2.7* 2.7* 2.8* 2.7* 2.7* 2.8*    CBC: Recent Labs  Lab 09/29/20 0445 09/29/20 1119 09/30/20 0510 10/02/20 0506 10/05/20 0652  WBC 4.8 5.1 4.5 5.8 5.3  NEUTROABS  --   --   --  3.4  --   HGB 8.6* 9.3* 8.5* 9.3* 8.7*  HCT 27.3* 28.9* 27.2* 28.8* 28.4*  MCV 98.6 97.6 99.3 98.3 100.0  PLT 230 251 212 235 300    CBG: Recent Labs  Lab 09/29/20 0752  09/29/20 1123 09/29/20 1644 09/30/20 0051  GLUCAP 88 102* 77 101*    Recent Results (from the past 240 hour(s))  Resp Panel by RT-PCR (Flu A&B, Covid) Nasopharyngeal Swab     Status: None   Collection Time: 09/27/20  7:46 PM   Specimen: Nasopharyngeal Swab; Nasopharyngeal(NP) swabs in vial transport medium  Result Value Ref Range Status   SARS Coronavirus 2 by RT PCR NEGATIVE NEGATIVE Final    Comment: (NOTE) SARS-CoV-2 target nucleic acids are NOT DETECTED.  The SARS-CoV-2 RNA is generally detectable in upper respiratory specimens during the acute phase of infection. The lowest concentration of SARS-CoV-2 viral copies this assay can detect is 138 copies/mL. A negative result does not preclude SARS-Cov-2 infection and should not be used as the sole basis for treatment or other patient management decisions. A negative result may occur with  improper specimen collection/handling, submission of specimen other than nasopharyngeal swab, presence of viral mutation(s) within the areas targeted by this assay, and inadequate number of viral copies(<138 copies/mL). A negative result must be combined with clinical observations, patient history, and epidemiological information. The expected result is Negative.  Fact Sheet for Patients:  EntrepreneurPulse.com.au  Fact Sheet for Healthcare Providers:  IncredibleEmployment.be  This test is no t yet approved or cleared by the [place] FDA and  has been authorized for detection and/or diagnosis of SARS-CoV-2 by FDA under an Emergency Use Authorization (EUA). This EUA will remain  in effect (meaning this test can be used) for the duration of the COVID-19 declaration under Section 564(b)(1) of the Act, 21 U.S.C.section 360bbb-3(b)(1), unless the authorization is terminated  or revoked sooner.       Influenza A by PCR NEGATIVE NEGATIVE Final   Influenza B by PCR NEGATIVE NEGATIVE Final    Comment:  (NOTE) The Xpert Xpress SARS-CoV-2/FLU/RSV plus assay is intended as an aid in the diagnosis of influenza from Nasopharyngeal swab specimens and should not be used as a sole basis for treatment. Nasal washings and aspirates are unacceptable for Xpert Xpress SARS-CoV-2/FLU/RSV testing.  Fact Sheet for Patients: EntrepreneurPulse.com.au  Fact Sheet for Healthcare Providers: IncredibleEmployment.be  This test is not yet approved or cleared by the [place] FDA and has  been authorized for detection and/or diagnosis of SARS-CoV-2 by FDA under an Emergency Use Authorization (EUA). This EUA will remain in effect (meaning this test can be used) for the duration of the COVID-19 declaration under Section 564(b)(1) of the Act, 21 U.S.C. section 360bbb-3(b)(1), unless the authorization is terminated or revoked.  Performed at [facility], [address] [place], [place] [address]   Body fluid culture w Gram Stain     Status: None   Collection Time: 09/28/20  8:47 AM   Specimen: Pleura; Body Fluid  Result Value Ref Range Status   Specimen Description   Final    PLEURAL Performed at [facility], [address] [place], [place] [address]    Special Requests   Final    NONE Performed at [facility], [address] [place], [place] [address]    Gram Stain NO WBC SEEN NO ORGANISMS SEEN   Final   Culture   Final    NO GROWTH Performed at [facility], [place] [address], [place], [place] [address]    Report Status 10/02/2020 FINAL  Final      Time spent in discharge (includes decision making & examination of pt): 35 minutes  10/05/2020, 10:07 AM   Cherene Altes, MD [facility] Office  [phone]

## 2020-10-05 NOTE — Care Management Important Message (Signed)
Important Message  Patient Details  Name: Cassandra Joseph MRN: [id number] Date of Birth: September 24, 1945   Medicare Important Message Given:  Yes     [name] A Juquan Reznick 10/05/2020, 9:59 AM

## 2020-10-05 NOTE — Progress Notes (Signed)
Patient discharged to [facility]F,  Called report to [facility] LPN at facility. No complain by patient

## 2020-10-05 NOTE — Progress Notes (Signed)
L arm PIV removed. Cannula intact. Pt tolerated well.

## 2020-10-05 NOTE — TOC Progression Note (Signed)
Transition of Care [facility]) - Progression Note    Patient Details  Name: Cassandra Joseph MRN: [id number] Date of Birth: [birth year]  Transition of Care [facility]) CM/SW Contact  Shelbie Hutching, RN Phone Number: 10/05/2020, 11:19 AM  Clinical Narrative:    Patient is medically cleared for discharge to [facility] today.  Rapid COVID is pending.  Once COVID test comes back patient can go to facility.  Patient will be going to room 2B.    Expected Discharge Plan: [place] Barriers to Discharge: Continued Medical Work up  Expected Discharge Plan and Services Expected Discharge Plan: [place]   Discharge Planning Services: CM Consult Post Acute Care Choice: [place] Living arrangements for the past 2 months: Single Family Home Expected Discharge Date: 10/05/20               DME Arranged: N/A DME Agency: NA                   Social Determinants of Health (SDOH) Interventions    Readmission Risk Interventions Readmission Risk Prevention Plan 08/11/2020 06/25/2019 05/14/2019  Transportation Screening Complete Complete Complete  PCP or Specialist Appt within 3-5 Days Complete Complete Complete  HRI or Home Care Consult Complete Complete Complete  Social Work Consult for [place] Planning/Counseling Complete Complete Complete  Palliative Care Screening Not Applicable Not Applicable Not Applicable  Medication Review Press photographer) Complete Complete Complete  Some recent data might be hidden

## 2020-10-05 NOTE — Plan of Care (Signed)

## 2020-10-06 NOTE — Addendum Note (Signed)
Addended by: Delice Bison E on: 10/06/2020 08:49 AM   Modules accepted: Orders

## 2020-10-06 NOTE — Progress Notes (Signed)
Per Brandi in Prior Auth, no PA is needed for the xgeva.

## 2020-10-06 NOTE — Progress Notes (Signed)
I will do the appt Melissa if you just want to link it due to chemo needed to be scheduled.

## 2020-10-13 ENCOUNTER — Encounter (HOSPITAL_COMMUNITY): Payer: Self-pay | Admitting: Internal Medicine

## 2020-10-13 ENCOUNTER — Inpatient Hospital Stay: Payer: Medicare Other | Attending: Internal Medicine

## 2020-10-13 ENCOUNTER — Inpatient Hospital Stay: Payer: Medicare Other | Admitting: Internal Medicine

## 2020-10-13 ENCOUNTER — Inpatient Hospital Stay: Payer: Medicare Other

## 2020-10-13 NOTE — Progress Notes (Deleted)
[facility] CONSULT NOTE  Patient Care Team: Tracie Harrier, MD as PCP - General (Internal Medicine)  CHIEF COMPLAINTS/PURPOSE OF CONSULTATION:  ***  #  Oncology History   No history exists.     HISTORY OF PRESENTING ILLNESS:  Cassandra Joseph 75 y.o.  female    ROS   MEDICAL HISTORY:  Past Medical History:  Diagnosis Date  . 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 CHF (congestive heart failure) ([place])   . ESRD (end stage renal disease) ([place])    a. on HD x 11 years; b. felt to be 2/2 HTN; c. HD TTS  . History of nuclear stress test    a. 08/2015: probably normal myocardial perfusion study, no evidence for significant ischemia or scar was noted, during stress, global systolic function normal, EF 54%, RV borderline dilatation, coronary artery calcifications and marked mitral annular calcifications were noted, liver calcifications were noted on attenuation CT scan likely represent sequelae of previous granulomatous disease  . HLD (hyperlipidemia)   . Hypertension   . Ischemic stroke ([place])    a. 03/2014  . Metabolic syndrome   . Morbid obesity ([place])   . Multiple myeloma ([facility])    a. in remission  . Osteomyelitis ([place])    a. 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 Rheumatoid arthritis ([place])   . TIA (transient ischemic attack)    a. 2014    SURGICAL HISTORY: Past Surgical History:  Procedure Laterality Date  . ABDOMINAL HYSTERECTOMY    . APPENDECTOMY    . eyelid surgery    . HERNIA REPAIR    . right knee surgery      SOCIAL HISTORY: Social History   Socioeconomic History  . Marital status: Married    Spouse name: Not on file  . Number of children: Not on file  . Years of education: Not on file  . Highest education level: Not on file  Occupational History  . Not on file  Tobacco Use  . Smoking status: Former Research scientist (life sciences)  . Smokeless tobacco: Never Used  . Tobacco comment: quit in 2007  Substance and Sexual Activity  . Alcohol use: No  . Drug  use: No  . Sexual activity: Not Currently  Other Topics Concern  . Not on file  Social History Narrative   Independent at baseline, ambulates with a walker   Social Determinants of Health   Financial Resource Strain: Not on file  Food Insecurity: Not on file  Transportation Needs: Not on file  Physical Activity: Not on file  Stress: Not on file  Social Connections: Not on file  Intimate Partner Violence: Not on file    FAMILY HISTORY: Family History  Problem Relation Age of Onset  . Seizures Mother   . Hypertension Mother   . Throat cancer Father   . Breast cancer Neg Hx     ALLERGIES:  is allergic to shellfish allergy.  MEDICATIONS:  Current Outpatient Medications  Medication Sig Dispense Refill  . apixaban (ELIQUIS) 2.5 MG TABS tablet Take 1 tablet (2.5 mg total) by mouth 2 (two) times daily. 60 tablet 0  . atorvastatin (LIPITOR) 80 MG tablet Take 1 tablet (80 mg total) by mouth daily. 30 tablet 0  . b complex-vitamin c-folic acid (NEPHRO-VITE) 0.8 MG TABS tablet Take 1 tablet by mouth daily.  11  . bisacodyl (DULCOLAX) 10 MG suppository Place 1 suppository (10 mg total) rectally daily as needed for moderate constipation. 12 suppository  0  . carvedilol (COREG) 12.5 MG tablet Take 1 tablet (12.5 mg total) by mouth 2 (two) times daily with a meal.    . cinacalcet (SENSIPAR) 60 MG tablet Take 60 mg by mouth 2 (two) times daily.    Marland Kitchen epoetin alfa (EPOGEN) 4000 UNIT/ML injection Inject 1 mL (4,000 Units total) into the vein Every Tuesday,Thursday,and Saturday with dialysis. 1 mL   . gabapentin (NEURONTIN) 300 MG capsule Take 300 mg by mouth 3 (three) times daily.    . hydroxychloroquine (PLAQUENIL) 200 MG tablet Take 200 mg by mouth daily.    . midodrine (PROAMATINE) 10 MG tablet Take 1 tablet (10 mg total) by mouth 3 (three) times daily with meals. 90 tablet 0  . polyethylene glycol (MIRALAX / GLYCOLAX) 17 g packet Take 17 g by mouth 2 (two) times daily. 14 each 0  .  sevelamer carbonate (RENVELA) 800 MG tablet Take 800 mg by mouth 4 (four) times daily.     No current facility-administered medications for this visit.      Marland Kitchen  PHYSICAL EXAMINATION: ECOG PERFORMANCE STATUS: {CHL ONC ECOG PS:[phone]}  There were no vitals filed for this visit. There were no vitals filed for this visit.  Physical Exam   LABORATORY DATA:  I have reviewed the data as listed Lab Results  Component Value Date   WBC 5.3 10/05/2020   HGB 8.7 (L) 10/05/2020   HCT 28.4 (L) 10/05/2020   MCV 100.0 10/05/2020   PLT 300 10/05/2020   Recent Labs    08/08/20 0016 08/09/20 0649 09/29/20 0445 09/30/20 0510 10/01/20 0509 10/03/20 0459 10/04/20 0457 10/05/20 0652  NA 140   < > 133* 133*   < > 135 137 136  K 4.3   < > 5.0 4.3   < > 5.1 3.9 4.6  CL 97*   < > 94* 94*   < > 98 100 98  CO2 27   < > 27 29   < > 26 28 25   GLUCOSE 82   < > 87 84   < > 79 80 73  BUN 51*   < > 63* 34*   < > 53* 34* 46*  CREATININE 9.29*   < > 9.30* 6.18*   < > 7.45* 4.91* 6.65*  CALCIUM 9.6   < > 10.7* 10.2   < > 11.1* 10.5* 11.0*  GFRNONAA 4*   < > 4* 7*   < > 5* 9* 6*  PROT 7.4  --  7.0 7.2  --   --   --   --   ALBUMIN 3.0*   < > 2.8* 2.7*   < > 2.7* 2.7* 2.8*  AST 15  --  8* 9*  --   --   --   --   ALT 10  --  6 7  --   --   --   --   ALKPHOS 60  --  46 46  --   --   --   --   BILITOT 0.9  --  0.8 0.8  --   --   --   --    < > = values in this interval not displayed.    RADIOGRAPHIC STUDIES: I have personally reviewed the radiological images as listed and agreed with the findings in the report. CT Head Wo Contrast  Result Date: 09/27/2020 CLINICAL DATA:  Head trauma.  Pain. EXAM: CT HEAD WITHOUT CONTRAST TECHNIQUE: Contiguous axial images were obtained from  the base of the skull through the vertex without intravenous contrast. COMPARISON:  March 26, 2014 FINDINGS: Brain: No subdural, epidural, or subarachnoid hemorrhage identified. Ventricles and sulci are stable. Mild white  matter changes have increased since 2015. No acute cortical ischemia or infarct. Cerebellum, brainstem, and basal cisterns are normal. No mass effect or midline shift. Vascular: No hyperdense vessel or unexpected calcification. Skull: There is a lytic lesion associated with the left frontal bone on series 2, image 45, new since 2015. No other bony lesions are identified. Sinuses/Orbits: Paranasal sinuses are normal. There is opacification of scattered mastoid air cells without bony erosion. The middle ears are well aerated. Other: None. IMPRESSION: 1. No acute intracranial abnormality. 2. Lytic lesion in the left frontal bone as above. This is new since 46 but otherwise age indeterminate. This lytic lesion could be due to the patient's history of multiple myeloma. Electronically Signed   By: Dorise Bullion III M.D   On: 09/27/2020 15:11   MR LUMBAR SPINE WO CONTRAST  Result Date: 09/30/2020 CLINICAL DATA:  Motor neuron disease. Progressive bilateral lower extremity weakness. EXAM: MRI LUMBAR SPINE WITHOUT CONTRAST TECHNIQUE: Multiplanar, multisequence MR imaging of the lumbar spine was performed. No intravenous contrast was administered. COMPARISON:  None. FINDINGS: Segmentation:  Standard. Alignment:  Mildly exaggerated lumbar lordosis. Vertebrae: Diffuse decrease of the T1 and T2 signal throughout the visualized spine, may be related to red marrow reconversion in the setting of chronic anemia. No acute fracture or evidence of discitis. Conus medullaris and cauda equina: Conus extends to the L1 level. Conus and cauda equina appear normal. Paraspinal and other soft tissues: Atrophic and multi-cystic kidneys. Disc levels: T12-L1: No spinal canal or neural foraminal stenosis. L1-2: No spinal canal or neural foraminal stenosis. L2-3: Shallow disc bulge and mild facet degenerative changes without significant spinal canal or neural foraminal stenosis. L3-4: Shallow disc bulge and mild facet degenerative changes  without significant spinal canal or neural foraminal stenosis. L4-5: Disc bulge, advanced facet degenerative changes and ligamentum flavum redundancy resulting in mild spinal canal stenosis and mild bilateral neural foraminal narrowing. L5-S1: Shallow disc bulge and moderate facet degenerative changes resulting in mild narrowing of the bilateral subarticular zones and mild left neural foraminal narrowing. IMPRESSION: 1. Mild lumbar spondylosis as described above, with mild spinal canal stenosis and mild bilateral neural foraminal narrowing at L4-5. 2. Mild narrowing of the bilateral subarticular zones and left neural foraminal narrowing at L5-S1. 3. Diffuse decrease of the T1 and T2 signal throughout the visualized spine, may be related to red marrow reconversion in the setting of chronic anemia. Electronically Signed   By: Pedro Earls M.D.   On: 09/30/2020 14:20   CT Chest High Resolution  Result Date: 10/02/2020 CLINICAL DATA:  Inpatient. Hypercalcemia of uncertain etiology. Concern for sarcoidosis. EXAM: CT CHEST WITHOUT CONTRAST TECHNIQUE: Multidetector CT imaging of the chest was performed following the standard protocol without intravenous contrast. High resolution imaging of the lungs, as well as inspiratory and expiratory imaging, was performed. COMPARISON:  11/09/2016 chest CT angiogram. FINDINGS: Cardiovascular: Moderate to marked cardiomegaly. No significant pericardial effusion/thickening. Three-vessel coronary atherosclerosis. Atherosclerotic nonaneurysmal thoracic aorta. Dilated main pulmonary artery (3.8 cm diameter). Mediastinum/Nodes: Hypodense partially calcified 1.6 cm posterior left thyroid nodule, not definitely seen on prior CT. Focus of coarse calcification in the posterior wall at the esophagogastric junction, unchanged. Otherwise normal esophagus. No axillary adenopathy. Right paratracheal adenopathy up to 1.1 cm short axis diameter (series 3/image 31), unchanged since  11/09/2016 chest CT. Stable mildly enlarged 1.1 cm subcarinal node (series 3/image 51). No discrete hilar adenopathy on these noncontrast images. Lungs/Pleura: No pneumothorax. No pleural effusion. No significant lobular air trapping or evidence of tracheobronchomalacia on the expiration sequence. Chronic marked elevation of the right hemidiaphragm with chronic platelike consolidation and curvilinear parenchymal bands at right lung base involving the intact right middle lobe with lesser involvement of the right upper and right lower lobes, compatible with a combination passive atelectasis and chronic nonspecific scarring. Mild chronic platelike scarring versus atelectasis in lingula. Scattered mild interlobular septal thickening in both lungs. No significant regions of subpleural reticulation, traction bronchiectasis, architectural distortion, ground-glass opacity or frank honeycombing. No lung masses or significant pulmonary nodules. Upper abdomen: Chronic coarse central liver calcifications. Atrophic visualized kidneys with confluent innumerable small renal cysts. Musculoskeletal: No aggressive appearing focal osseous lesions. Diffuse sclerosis of the thoracic osseous structures, more pronounced compared to prior chest CT. Moderate thoracic spondylosis. IMPRESSION: 1. Moderate to marked cardiomegaly. Scattered mild interlobular septal thickening, suggesting mild pulmonary edema. No pleural effusions. 2. Dilated main pulmonary artery, suggesting pulmonary arterial hypertension. 3. 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 4. Chronic marked elevation of the right hemidiaphragm with chronic platelike consolidation and curvilinear parenchymal bands at the right lung base, compatible with a combination of passive atelectasis and chronic nonspecific scarring. No  evidence of interstitial lung disease. 5. Nonspecific mild mediastinal lymphadenopathy, stable since 11/09/2016 chest CT, most compatible with benign reactive adenopathy. 6. Hypodense 1.6 cm posterior left thyroid nodule, not definitely seen on prior CT. Recommend thyroid US (ref: J Am Coll Radiol. 2015 Feb;12(2): 143-50). 7. Aortic Atherosclerosis (ICD10-I70.0). Electronically Signed   By: Ilona Sorrel M.D.   On: 10/02/2020 12:34   CT Hip Left Wo Contrast  Result Date: 09/27/2020 CLINICAL DATA:  Left hip pain since a fall 09/18/2020. The patient is unable to bear weight. EXAM: CT OF THE LEFT HIP WITHOUT CONTRAST TECHNIQUE: Multidetector CT imaging of the left hip was performed according to the standard protocol. Multiplanar CT image reconstructions were also generated. COMPARISON:  Plain films left hip earlier today. CT abdomen and pelvis 07/13/2005 FINDINGS: Bones/Joint/Cartilage No fracture, dislocation or worrisome lesion is identified. There is some degenerative disease about the right hip with a subchondral cyst seen in the acetabulum. Small to moderate left hip joint effusion is present. Erosive change is seen about both SI joints. Ligaments Suboptimally assessed by CT. Muscles and Tendons Appear intact. Soft tissues Fat containing left inguinal hernia is seen. Sigmoid diverticulosis noted. IMPRESSION: Negative for fracture. Small to moderate left hip joint effusion is nonspecific and could be due to degenerative disease or synovitis. Erosive change about the SI joints new since 2006. Differential considerations include spondyloarthropathy or metabolic process such as secondary hyperparathyroidism. Electronically Signed   By: Inge Rise M.D.   On: 09/27/2020 15:13   US Venous Img Lower Unilateral Left  Result Date: 09/27/2020 CLINICAL DATA:  Left leg swelling. EXAM: LEFT LOWER EXTREMITY VENOUS DOPPLER ULTRASOUND TECHNIQUE: Gray-scale sonography with compression, as well as color and duplex  ultrasound, were performed to evaluate the deep venous system(s) from the level of the common femoral vein through the popliteal and proximal calf veins. COMPARISON:  None. FINDINGS: VENOUS Normal compressibility of the common femoral, superficial femoral, and popliteal veins, as well as the visualized calf veins. Visualized portions of profunda femoral vein  and great saphenous vein unremarkable. No filling defects to suggest DVT on grayscale or color Doppler imaging. Doppler waveforms show normal direction of venous flow, normal respiratory plasticity and response to augmentation. Limited views of the contralateral common femoral vein are unremarkable. OTHER None. Limitations: None. IMPRESSION: Negative exam. Electronically Signed   By: Inge Rise M.D.   On: 09/27/2020 13:35   DG Knee Complete 4 Views Left  Result Date: 09/27/2020 CLINICAL DATA:  Worsening left knee pain since a fall 09/18/2020. Initial encounter. EXAM: LEFT KNEE - COMPLETE 4+ VIEW COMPARISON:  None. FINDINGS: No acute bony or joint abnormality is identified. Osteoarthritis about the knee is worst in the medial compartment. Small joint effusion noted. IMPRESSION: No acute abnormality. 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 Electronically Signed   By: Inge Rise M.D.   On: 09/27/2020 14:00   DG Bone Survey Met  Result Date: 09/29/2020 CLINICAL DATA:  Multiple myeloma. EXAM: METASTATIC BONE SURVEY COMPARISON:  August 27, 2008.  August 07, 2020. FINDINGS: Mild cardiomegaly is noted. No definite lytic lesion is seen. Increased sclerosis of visualized skeleton is noted consistent with history of end-stage renal disease. IMPRESSION: No definite lytic lesion is seen. Increased sclerosis of visualized skeleton consistent with history of end-stage renal disease. Electronically Signed   By: Marijo Conception M.D.   On: 09/29/2020 11:28   DG Foot Complete Left  Result Date: 09/27/2020 CLINICAL DATA:   Worsening left foot pain since a fall 09/18/2020. Initial encounter. EXAM: LEFT FOOT - COMPLETE 3+ VIEW COMPARISON:  None. FINDINGS: No acute bony or joint abnormality. The patient is status post amputation of the fifth toe. Mild midfoot osteoarthritis is seen. Extensive vascular calcifications. IMPRESSION: No acute finding. Electronically Signed   By: Inge Rise M.D.   On: 09/27/2020 14:02   CT BONE MARROW BIOPSY  Result Date: 10/02/2020 INDICATION: 75 year old female with history of multiple myeloma. EXAM: CT-GUIDED BONE MARROW BIOPSY AND ASPIRATION MEDICATIONS: None ANESTHESIA/SEDATION: Fentanyl 25 mcg IV; Versed 0.5 mg IV Sedation Time: 10 minutes; The patient was continuously monitored during the procedure by the interventional radiology nurse under my direct supervision. COMPLICATIONS: None immediate. PROCEDURE: Informed consent was obtained from the patient following an explanation of the procedure, risks, benefits and alternatives. The patient understands, agrees and consents for the procedure. All questions were addressed. A time out was performed prior to the initiation of the procedure. The patient was positioned prone and non-contrast localization CT was performed of the pelvis to demonstrate the iliac marrow spaces. The operative site was prepped and draped in the usual sterile fashion. Under sterile conditions and local anesthesia, a 22 gauge spinal needle was utilized for procedural planning. Next, an 11 gauge coaxial bone biopsy needle was advanced into the right iliac marrow space. Needle position was confirmed with CT imaging. Initially, a bone marrow aspiration was performed. Next, a bone marrow biopsy was obtained with the 11 gauge outer bone marrow device. Samples were prepared with the cytotechnologist and deemed adequate. The needle was removed and superficial hemostasis was obtained with manual compression. A dressing was applied. The patient tolerated the procedure well without  immediate post procedural complication. IMPRESSION: Successful CT guided right iliac bone marrow aspiration and core biopsy. Ruthann Cancer, MD [facility] Electronically Signed   By: Ruthann Cancer MD   On: 10/02/2020 12:55   DG FLUORO GUIDED NEEDLE PLC ASPIRATION/INJECTION LOC  Result Date: 09/28/2020 CLINICAL DATA:  Left hip joint  effusion EXAM: LEFT HIP ASPIRATION UNDER FLUOROSCOPY COMPARISON:  None. FLUOROSCOPY TIME:  Fluoroscopy Time:  0.1 minute Radiation Exposure Index (if provided by the fluoroscopic device): 0.6 mGy Number of Acquired Spot Images: 0 PROCEDURE: Overlying skin prepped with Betadine, draped in the usual sterile fashion, and infiltrated locally with buffered Lidocaine. Curved 20 gauge spinal needle advanced to the superolateral margin of the left femoral head-neck junction. Diagnostic injection of iodinated contrast demonstrates intra-articular spread without intravascular component. 5 mL of serosanguineous synovial fluid was aspirated from the left hip joint. Needle was removed. No immediate complications. Patient demonstrated no discomfort. IMPRESSION: Technically successful left hip aspiration under fluoroscopy. Electronically Signed   By: Kathreen Devoid   On: 09/28/2020 16:36   DG Hip Unilat W or Wo Pelvis 2-3 Views Left  Addendum Date: 09/27/2020   ADDENDUM REPORT: 09/27/2020 15:09 ADDENDUM: This addendum is given for the purpose of noting the patient has erosions about both SI joints. Electronically Signed   By: Inge Rise M.D.   On: 09/27/2020 15:09   Result Date: 09/27/2020 CLINICAL DATA:  Left hip pain since a fall 09/18/2020. Initial encounter. EXAM: DG HIP (WITH OR WITHOUT PELVIS) 2-3V LEFT COMPARISON:  None. FINDINGS: There is no evidence of hip fracture or dislocation. There is no evidence of arthropathy or other focal bone abnormality. IMPRESSION: Negative exam. Electronically Signed: By: Inge Rise M.D.  On: 09/27/2020 14:03    ASSESSMENT & PLAN:   No problem-specific Assessment & Plan notes found for this encounter.    All questions were answered. The patient knows to call the clinic with any problems, questions or concerns.       Cammie Sickle, MD 10/13/2020 8:35 AM

## 2020-10-14 LAB — SURGICAL PATHOLOGY

## 2020-10-22 ENCOUNTER — Other Ambulatory Visit: Payer: Self-pay | Admitting: Internal Medicine

## 2020-10-26 ENCOUNTER — Inpatient Hospital Stay (HOSPITAL_BASED_OUTPATIENT_CLINIC_OR_DEPARTMENT_OTHER): Payer: Medicare Other | Admitting: Internal Medicine

## 2020-10-26 ENCOUNTER — Inpatient Hospital Stay: Payer: Medicare Other | Attending: Internal Medicine

## 2020-10-26 ENCOUNTER — Inpatient Hospital Stay: Payer: Medicare Other

## 2020-10-26 ENCOUNTER — Encounter: Payer: Self-pay | Admitting: Internal Medicine

## 2020-10-26 VITALS — BP 156/79 | HR 66 | Temp 97.7°F | Resp 16 | Ht 66.0 in | Wt 260.0 lb

## 2020-10-26 DIAGNOSIS — C9002 Multiple myeloma in relapse: Secondary | ICD-10-CM | POA: Diagnosis not present

## 2020-10-26 DIAGNOSIS — I5033 Acute on chronic diastolic (congestive) heart failure: Secondary | ICD-10-CM

## 2020-10-26 DIAGNOSIS — I132 Hypertensive heart and chronic kidney disease with heart failure and with stage 5 chronic kidney disease, or end stage renal disease: Secondary | ICD-10-CM | POA: Insufficient documentation

## 2020-10-26 DIAGNOSIS — N186 End stage renal disease: Secondary | ICD-10-CM | POA: Diagnosis not present

## 2020-10-26 DIAGNOSIS — C9000 Multiple myeloma not having achieved remission: Secondary | ICD-10-CM | POA: Diagnosis present

## 2020-10-26 DIAGNOSIS — I509 Heart failure, unspecified: Secondary | ICD-10-CM | POA: Diagnosis not present

## 2020-10-26 DIAGNOSIS — Z992 Dependence on renal dialysis: Secondary | ICD-10-CM | POA: Insufficient documentation

## 2020-10-26 LAB — CBC WITH DIFFERENTIAL/PLATELET
Abs Immature Granulocytes: 0.02 10*3/uL (ref 0.00–0.07)
Basophils Absolute: 0 10*3/uL (ref 0.0–0.1)
Basophils Relative: 1 %
Eosinophils Absolute: 0.1 10*3/uL (ref 0.0–0.5)
Eosinophils Relative: 2 %
HCT: 30.1 % — ABNORMAL LOW (ref 36.0–46.0)
Hemoglobin: 9.3 g/dL — ABNORMAL LOW (ref 12.0–15.0)
Immature Granulocytes: 0 %
Lymphocytes Relative: 23 %
Lymphs Abs: 1.3 10*3/uL (ref 0.7–4.0)
MCH: 31.3 pg (ref 26.0–34.0)
MCHC: 30.9 g/dL (ref 30.0–36.0)
MCV: 101.3 fL — ABNORMAL HIGH (ref 80.0–100.0)
Monocytes Absolute: 0.5 10*3/uL (ref 0.1–1.0)
Monocytes Relative: 10 %
Neutro Abs: 3.4 10*3/uL (ref 1.7–7.7)
Neutrophils Relative %: 64 %
Platelets: 191 10*3/uL (ref 150–400)
RBC: 2.97 MIL/uL — ABNORMAL LOW (ref 3.87–5.11)
RDW: 16.4 % — ABNORMAL HIGH (ref 11.5–15.5)
WBC: 5.4 10*3/uL (ref 4.0–10.5)
nRBC: 0 % (ref 0.0–0.2)

## 2020-10-26 LAB — COMPREHENSIVE METABOLIC PANEL
ALT: 12 U/L (ref 0–44)
AST: 15 U/L (ref 15–41)
Albumin: 3.5 g/dL (ref 3.5–5.0)
Alkaline Phosphatase: 62 U/L (ref 38–126)
Anion gap: 12 (ref 5–15)
BUN: 34 mg/dL — ABNORMAL HIGH (ref 8–23)
CO2: 30 mmol/L (ref 22–32)
Calcium: 9.3 mg/dL (ref 8.9–10.3)
Chloride: 92 mmol/L — ABNORMAL LOW (ref 98–111)
Creatinine, Ser: 5.49 mg/dL — ABNORMAL HIGH (ref 0.44–1.00)
GFR, Estimated: 8 mL/min — ABNORMAL LOW (ref >60)
Glucose, Bld: 118 mg/dL — ABNORMAL HIGH (ref 70–99)
Potassium: 4.3 mmol/L (ref 3.5–5.1)
Sodium: 134 mmol/L — ABNORMAL LOW (ref 135–145)
Total Bilirubin: 0.6 mg/dL (ref 0.3–1.2)
Total Protein: 7.8 g/dL (ref 6.5–8.1)

## 2020-10-26 NOTE — Progress Notes (Signed)
[facility] NOTE  Patient Care Team: Tracie Harrier, MD as PCP - General (Internal Medicine)  CHIEF COMPLAINTS/PURPOSE OF CONSULTATION: Multiple myeloma  #  Oncology History Overview Note     With regards to multiple myeloma followed by Dr. Inez Pilgrim. Bone marrow biopsy repeat [facility] October 06, 2005 29% plasma cells-[[facility]]s/p Velcade Decadron; discontinued Velcade in July 2007.  Recommend surveillance.  However review of records reveal patient has not followed up either at [facility] oncology or [facility].  # The marrow is variably cellular. There is a mild increase in plasma  cells (10% aspirate and CD138 immunohistochemistry) with atypia and  kappa excess. These findings are consistent with continued mild  involvement by the patient's plasma cell neoplasm. While significant  dysplasia is not seen, MDS FISH will be ordered given the pockets of  highly cellular marrow.    Multiple myeloma ([place])  11/09/2016 Initial Diagnosis   Multiple myeloma (HCC)      HISTORY OF PRESENTING ILLNESS:  Cassandra Joseph 75 y.o.  female patient with multiple medical problems-including end-stage renal disease on dialysis; remote history of multiple myeloma; was recently admitted to hospital for worsening hip pain/joint effusion..  Patient is status post aspiration. Patient had a fall prior to admission which led to a CT scan of the head noncontrast-which showed 8 mm lytic lesion.  Of note patient's calcium was also elevated between 10-12.  Patient hemoglobin ~9.  Patient received IV pamidronate in the hospital.  Patient also underwent bone marrow biopsy-further evaluation for possible multiple myeloma.  She is currently at home.  She currently accompanied by daughter.  Chronic joint pains back pain not any worse.  Review of Systems  Constitutional: Positive for malaise/fatigue. Negative for chills, diaphoresis and fever.  HENT: Negative for nosebleeds and sore throat.   Eyes: Negative for double  vision.  Respiratory: Negative for cough, hemoptysis, sputum production, shortness of breath and wheezing.   Cardiovascular: Negative for chest pain, palpitations, orthopnea and leg swelling.  Gastrointestinal: Negative for abdominal pain, blood in stool, constipation, diarrhea, heartburn, melena, nausea and vomiting.  Genitourinary: Negative for dysuria, frequency and urgency.  Musculoskeletal: Positive for back pain and joint pain.  Skin: Negative.  Negative for itching and rash.  Neurological: Negative for dizziness, tingling, focal weakness, weakness and headaches.  Endo/Heme/Allergies: Does not bruise/bleed easily.  Psychiatric/Behavioral: Negative for depression. The patient is not nervous/anxious and does not have insomnia.      MEDICAL HISTORY:  Past Medical History:  Diagnosis Date  . 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 CHF (congestive heart failure) ([place])   . ESRD (end stage renal disease) ([place])    a. on HD x 11 years; b. felt to be 2/2 HTN; c. HD TTS  . History of nuclear stress test    a. 08/2015: probably normal myocardial perfusion study, no evidence for significant ischemia or scar was noted, during stress, global systolic function normal, EF 54%, RV borderline dilatation, coronary artery calcifications and marked mitral annular calcifications were noted, liver calcifications were noted on attenuation CT scan likely represent sequelae of previous granulomatous disease  . HLD (hyperlipidemia)   . Hypertension   . Ischemic stroke ([place])    a. 03/2014  . Metabolic syndrome   . Morbid obesity ([place])   . Multiple myeloma (HCC)    a. in remission  . Osteomyelitis ([place])    a. 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 Rheumatoid arthritis ([place])   .  TIA (transient ischemic attack)    a. 2014    SURGICAL HISTORY: Past Surgical History:  Procedure Laterality Date  . ABDOMINAL HYSTERECTOMY    . APPENDECTOMY    . eyelid surgery    . HERNIA REPAIR    . right knee surgery       SOCIAL HISTORY: Social History   Socioeconomic History  . Marital status: Married    Spouse name: Not on file  . Number of children: Not on file  . Years of education: Not on file  . Highest education level: Not on file  Occupational History  . Not on file  Tobacco Use  . Smoking status: Former Research scientist (life sciences)  . Smokeless tobacco: Never Used  . Tobacco comment: quit in 2007  Substance and Sexual Activity  . Alcohol use: No  . Drug use: No  . Sexual activity: Not Currently  Other Topics Concern  . Not on file  Social History Narrative   Independent at baseline, ambulates with a walker   Social Determinants of Health   Financial Resource Strain: Not on file  Food Insecurity: Not on file  Transportation Needs: Not on file  Physical Activity: Not on file  Stress: Not on file  Social Connections: Not on file  Intimate Partner Violence: Not on file    FAMILY HISTORY: Family History  Problem Relation Age of Onset  . Seizures Mother   . Hypertension Mother   . Throat cancer Father   . Breast cancer Neg Hx     ALLERGIES:  is allergic to shellfish allergy.  MEDICATIONS:  Current Outpatient Medications  Medication Sig Dispense Refill  . apixaban (ELIQUIS) 2.5 MG TABS tablet Take 1 tablet (2.5 mg total) by mouth 2 (two) times daily. 60 tablet 0  . atorvastatin (LIPITOR) 80 MG tablet Take 1 tablet (80 mg total) by mouth daily. 30 tablet 0  . b complex-vitamin c-folic acid (NEPHRO-VITE) 0.8 MG TABS tablet Take 1 tablet by mouth daily.  11  . bisacodyl (DULCOLAX) 10 MG suppository Place 1 suppository (10 mg total) rectally daily as needed for moderate constipation. 12 suppository 0  . carvedilol (COREG) 12.5 MG tablet Take 1 tablet (12.5 mg total) by mouth 2 (two) times daily with a meal.    . cinacalcet (SENSIPAR) 60 MG tablet Take 60 mg by mouth 2 (two) times daily.    Marland Kitchen epoetin alfa (EPOGEN) 4000 UNIT/ML injection Inject 1 mL (4,000 Units total) into the vein Every  Tuesday,Thursday,and Saturday with dialysis. 1 mL   . gabapentin (NEURONTIN) 300 MG capsule Take 300 mg by mouth 3 (three) times daily.    . hydroxychloroquine (PLAQUENIL) 200 MG tablet Take 200 mg by mouth daily.    . midodrine (PROAMATINE) 10 MG tablet Take 1 tablet (10 mg total) by mouth 3 (three) times daily with meals. 90 tablet 0  . polyethylene glycol (MIRALAX / GLYCOLAX) 17 g packet Take 17 g by mouth 2 (two) times daily. 14 each 0  . sevelamer carbonate (RENVELA) 800 MG tablet Take 800 mg by mouth 4 (four) times daily.     No current facility-administered medications for this visit.      Marland Kitchen  PHYSICAL EXAMINATION: ECOG PERFORMANCE STATUS: 1 - Symptomatic but completely ambulatory  Vitals:   10/26/20 1438  BP: (!) 156/79  Pulse: 66  Resp: 16  Temp: 97.7 F (36.5 C)  SpO2: 100%   Filed Weights   10/26/20 1438  Weight: 260 lb (117.9 kg)    Physical  Exam HENT:     Head: Normocephalic and atraumatic.     Mouth/Throat:     Pharynx: No oropharyngeal exudate.  Eyes:     Pupils: Pupils are equal, round, and reactive to light.  Cardiovascular:     Rate and Rhythm: Normal rate and regular rhythm.  Pulmonary:     Effort: No respiratory distress.     Breath sounds: No wheezing.  Abdominal:     General: Bowel sounds are normal. There is no distension.     Palpations: Abdomen is soft. There is no mass.     Tenderness: There is no abdominal tenderness. There is no guarding or rebound.  Musculoskeletal:        General: No tenderness. Normal range of motion.     Cervical back: Normal range of motion and neck supple.  Skin:    General: Skin is warm.  Neurological:     Mental Status: She is alert and oriented to person, place, and time.  Psychiatric:        Mood and Affect: Affect normal.      LABORATORY DATA:  I have reviewed the data as listed Lab Results  Component Value Date   WBC 5.4 10/26/2020   HGB 9.3 (L) 10/26/2020   HCT 30.1 (L) 10/26/2020   MCV 101.3  (H) 10/26/2020   PLT 191 10/26/2020   Recent Labs    09/29/20 0445 09/30/20 0510 10/01/20 0509 10/04/20 0457 10/05/20 0652 10/26/20 1424  NA 133* 133*   < > 137 136 134*  K 5.0 4.3   < > 3.9 4.6 4.3  CL 94* 94*   < > 100 98 92*  CO2 27 29   < > 28 25 30   GLUCOSE 87 84   < > 80 73 118*  BUN 63* 34*   < > 34* 46* 34*  CREATININE 9.30* 6.18*   < > 4.91* 6.65* 5.49*  CALCIUM 10.7* 10.2   < > 10.5* 11.0* 9.3  GFRNONAA 4* 7*   < > 9* 6* 8*  PROT 7.0 7.2  --   --   --  7.8  ALBUMIN 2.8* 2.7*   < > 2.7* 2.8* 3.5  AST 8* 9*  --   --   --  15  ALT 6 7  --   --   --  12  ALKPHOS 46 46  --   --   --  62  BILITOT 0.8 0.8  --   --   --  0.6   < > = values in this interval not displayed.    RADIOGRAPHIC STUDIES: I have personally reviewed the radiological images as listed and agreed with the findings in the report. No results found.  ASSESSMENT & PLAN:   Multiple myeloma ([place]) #75 year old female patient with multiple medical problems including end-stage renal disease on dialysis; history of multiple myeloma [2007]-incidentally noted to have a lytic lesion in the skull.   #Monoclonal gammopathy-multiple myeloma-unclear if active or smoldering-based on pathology [bone marrow biopsy ~10 % plasma cells]. M protein 1.2 g; kappa/lambda light chain ratio slightly abnormal.  Recommend a PET scan for further evaluation.  #Hypercalcemia calcium between 11 and 12-unclear etiology.  Status post pamidronate 60 mg over 6 hours on 2/25.  Today calcium normal.  Hold Xgeva.  #End-stage renal disease/anemia [Dr.Singh]-on dialysis.  # DISPOSITION: # NO X-geva today # follow up in 2 weeks- MD; labs-cbc/bmp; possible x-geva; PET scan prior- Dr.B  All questions were answered. The patient  knows to call the clinic with any problems, questions or concerns.    Cammie Sickle, MD 11/05/2020 3:55 PM

## 2020-10-26 NOTE — Assessment & Plan Note (Addendum)
#  75 year old female patient with multiple medical problems including end-stage renal disease on dialysis; history of multiple myeloma [2007]-incidentally noted to have a lytic lesion in the skull.   #Monoclonal gammopathy-multiple myeloma-unclear if active or smoldering-based on pathology [bone marrow biopsy ~10 % plasma cells]. M protein 1.2 g; kappa/lambda light chain ratio slightly abnormal.  Recommend a PET scan for further evaluation.  #Hypercalcemia calcium between 11 and 12-unclear etiology.  Status post pamidronate 60 mg over 6 hours on 2/25.  Today calcium normal.  Hold Xgeva.  #End-stage renal disease/anemia [Dr.Singh]-on dialysis.  # DISPOSITION: # NO X-geva today # follow up in 2 weeks- MD; labs-cbc/bmp; possible x-geva; PET scan prior- Dr.B

## 2020-10-27 ENCOUNTER — Other Ambulatory Visit: Payer: Self-pay | Admitting: Nurse Practitioner

## 2020-10-27 DIAGNOSIS — C9000 Multiple myeloma not having achieved remission: Secondary | ICD-10-CM

## 2020-11-09 ENCOUNTER — Inpatient Hospital Stay: Payer: Medicare Other | Admitting: Internal Medicine

## 2020-11-09 ENCOUNTER — Inpatient Hospital Stay: Payer: Medicare Other

## 2020-11-11 ENCOUNTER — Other Ambulatory Visit: Payer: Self-pay

## 2020-11-11 ENCOUNTER — Encounter
Admission: RE | Admit: 2020-11-11 | Discharge: 2020-11-11 | Disposition: A | Discharge status: Home / Self Care | Payer: Medicare Other | Source: Ambulatory Visit | Attending: Internal Medicine | Admitting: Internal Medicine

## 2020-11-11 DIAGNOSIS — C9002 Multiple myeloma in relapse: Secondary | ICD-10-CM

## 2020-11-11 LAB — GLUCOSE, CAPILLARY: Glucose-Capillary: 76 mg/dL (ref 70–99)

## 2020-11-13 ENCOUNTER — Inpatient Hospital Stay: Payer: Medicare Other

## 2020-11-13 ENCOUNTER — Inpatient Hospital Stay: Payer: Medicare Other | Admitting: Internal Medicine

## 2020-11-16 ENCOUNTER — Inpatient Hospital Stay: Payer: Medicare Other

## 2020-11-16 ENCOUNTER — Inpatient Hospital Stay: Payer: Medicare Other | Admitting: Internal Medicine

## 2020-11-16 ENCOUNTER — Telehealth: Payer: Self-pay | Admitting: *Deleted

## 2020-11-16 NOTE — Telephone Encounter (Signed)
Received incoming message from [place] in nuc med. Jodell Cipro had contacted the receptionist at [facility] care on Friday to remind the facility of the apt and pet scan prep. Patient still no showed for this apt.  RN Spoke with daughter, [name], to make her aware that patient did not keep the apt. Daughter very upset that the facility did not bring pt to the apts. She stated that  Yesterday she inqired with [facility] yesterday regarding her apts for her mother. [facility] could not give her the apts. They stated they were waiting on the clinic to notify the facility of the apts. I explained to daughter that Jodell Cipro in [place] called last Friday and reminded the facility of the apts. Our office had also given [facility] care the apts for md/pet scan.  Daughter stated that patient is only available on Monday or Wednesday due to dialysis. We will see if we can get the apts on Wednesday if possible or the following Monday.

## 2020-11-17 ENCOUNTER — Telehealth: Payer: Self-pay | Admitting: Internal Medicine

## 2020-11-22 ENCOUNTER — Emergency Department
Admission: EM | Admit: 2020-11-22 | Discharge: 2020-11-23 | Disposition: A | Discharge status: Home / Self Care | Payer: Medicare Other | Attending: Emergency Medicine | Admitting: Emergency Medicine

## 2020-11-22 ENCOUNTER — Encounter: Payer: Self-pay | Admitting: Emergency Medicine

## 2020-11-22 ENCOUNTER — Other Ambulatory Visit: Payer: Self-pay

## 2020-11-22 DIAGNOSIS — R1032 Left lower quadrant pain: Secondary | ICD-10-CM | POA: Insufficient documentation

## 2020-11-22 DIAGNOSIS — Z992 Dependence on renal dialysis: Secondary | ICD-10-CM | POA: Diagnosis not present

## 2020-11-22 DIAGNOSIS — K644 Residual hemorrhoidal skin tags: Secondary | ICD-10-CM | POA: Insufficient documentation

## 2020-11-22 DIAGNOSIS — Z87891 Personal history of nicotine dependence: Secondary | ICD-10-CM | POA: Diagnosis not present

## 2020-11-22 DIAGNOSIS — N186 End stage renal disease: Secondary | ICD-10-CM | POA: Insufficient documentation

## 2020-11-22 DIAGNOSIS — I132 Hypertensive heart and chronic kidney disease with heart failure and with stage 5 chronic kidney disease, or end stage renal disease: Secondary | ICD-10-CM | POA: Diagnosis not present

## 2020-11-22 DIAGNOSIS — Z79899 Other long term (current) drug therapy: Secondary | ICD-10-CM | POA: Insufficient documentation

## 2020-11-22 DIAGNOSIS — K6289 Other specified diseases of anus and rectum: Secondary | ICD-10-CM | POA: Diagnosis present

## 2020-11-22 DIAGNOSIS — I509 Heart failure, unspecified: Secondary | ICD-10-CM | POA: Diagnosis not present

## 2020-11-22 DIAGNOSIS — Z7901 Long term (current) use of anticoagulants: Secondary | ICD-10-CM | POA: Diagnosis not present

## 2020-11-22 LAB — CBC
HCT: 34.4 % — ABNORMAL LOW (ref 36.0–46.0)
Hemoglobin: 10.6 g/dL — ABNORMAL LOW (ref 12.0–15.0)
MCH: 31.9 pg (ref 26.0–34.0)
MCHC: 30.8 g/dL (ref 30.0–36.0)
MCV: 103.6 fL — ABNORMAL HIGH (ref 80.0–100.0)
Platelets: 158 10*3/uL (ref 150–400)
RBC: 3.32 MIL/uL — ABNORMAL LOW (ref 3.87–5.11)
RDW: 17.3 % — ABNORMAL HIGH (ref 11.5–15.5)
WBC: 6.6 10*3/uL (ref 4.0–10.5)
nRBC: 0 % (ref 0.0–0.2)

## 2020-11-22 LAB — COMPREHENSIVE METABOLIC PANEL
ALT: 11 U/L (ref 0–44)
AST: 14 U/L — ABNORMAL LOW (ref 15–41)
Albumin: 3.5 g/dL (ref 3.5–5.0)
Alkaline Phosphatase: 53 U/L (ref 38–126)
Anion gap: 10 (ref 5–15)
BUN: 27 mg/dL — ABNORMAL HIGH (ref 8–23)
CO2: 28 mmol/L (ref 22–32)
Calcium: 11.1 mg/dL — ABNORMAL HIGH (ref 8.9–10.3)
Chloride: 98 mmol/L (ref 98–111)
Creatinine, Ser: 4.55 mg/dL — ABNORMAL HIGH (ref 0.44–1.00)
GFR, Estimated: 10 mL/min — ABNORMAL LOW (ref >60)
Glucose, Bld: 85 mg/dL (ref 70–99)
Potassium: 4.3 mmol/L (ref 3.5–5.1)
Sodium: 136 mmol/L (ref 135–145)
Total Bilirubin: 0.6 mg/dL (ref 0.3–1.2)
Total Protein: 7.4 g/dL (ref 6.5–8.1)

## 2020-11-22 LAB — LIPASE, BLOOD: Lipase: 37 U/L (ref 11–51)

## 2020-11-22 MED ORDER — HYDROCORTISONE (PERIANAL) 2.5 % EX CREA
TOPICAL_CREAM | Freq: Once | CUTANEOUS | Status: DC
  Filled 2020-11-22 (×2): qty 28.35

## 2020-11-22 NOTE — ED Triage Notes (Signed)
Pt via EMS from [facility]. Pt c/o LLQ abd pain and rectal pain for about a week. Pt states she think its a hemorrhoids. Pt states she did vomit once during breakfast. Pt is a dialysis pt. Pt is A&Ox4 and NAD.

## 2020-11-22 NOTE — ED Notes (Signed)
Pt talking on phone in n o acute distress.

## 2020-11-23 NOTE — ED Provider Notes (Signed)
[facility] Emergency Department Provider Note   ____________________________________________   Event Date/Time   First MD Initiated Contact with Patient 11/22/20 2238     (approximate)  I have reviewed the triage vital signs and the nursing notes.   HISTORY  Chief Complaint Abdominal Pain and Rectal Pain    HPI Cassandra Joseph is a 75 y.o. female with a past medical history of CHF, end-stage renal disease on dialysis, type 2 diabetes, and morbid obesity who presents for rectal pain that has been present over the last week.  Patient states that she does have a history of external hemorrhoids but denies ever having a thrombosed hemorrhoid in the past.  Patient states that she has not used any medications for this pain.  Patient does endorse associated left lower quadrant, nonradiating, dull aching, 4/10 abdominal pain.  Patient currently denies any vision changes, tinnitus, difficulty speaking, facial droop, sore throat, chest pain, shortness of breath, nausea/vomiting/diarrhea, dysuria, or weakness/numbness/paresthesias in any extremity         Past Medical History:  Diagnosis Date  . 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 CHF (congestive heart failure) ([place])   . ESRD (end stage renal disease) ([place])    a. on HD x 11 years; b. felt to be 2/2 HTN; c. HD TTS  . History of nuclear stress test    a. 08/2015: probably normal myocardial perfusion study, no evidence for significant ischemia or scar was noted, during stress, global systolic function normal, EF 54%, RV borderline dilatation, coronary artery calcifications and marked mitral annular calcifications were noted, liver calcifications were noted on attenuation CT scan likely represent sequelae of previous granulomatous disease  . HLD (hyperlipidemia)   . Hypertension   . Ischemic stroke ([place])    a. 03/2014  . Metabolic syndrome   . Morbid obesity ([place])   . Multiple myeloma (HCC)    a. in remission   . Osteomyelitis ([place])    a. 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 Rheumatoid arthritis ([place])   . TIA (transient ischemic attack)    a. 2014    Patient Active Problem List   Diagnosis Date Noted  . Effusion of left hip 09/28/2020  . Septic arthritis ([place]) 09/27/2020  . 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 Left leg pain   . Viral pneumonia 08/08/2020  . Pneumonia due to COVID-19 virus 08/07/2020  . History of anemia due to chronic kidney disease   . ESRD (end stage renal disease) ([place])   . SOB (shortness of breath)   . Acute pulmonary edema ([place]) 06/23/2019  . Acute on chronic diastolic CHF (congestive heart failure) ([place]) 01/18/2017  . HLD (hyperlipidemia) 01/18/2017  . Atypical chest pain 11/09/2016  . ESRD on hemodialysis ([place]) 11/09/2016  . Morbid obesity ([place]) 11/09/2016  . Essential hypertension 11/09/2016  . Multiple myeloma ([place]) 11/09/2016  . Elevated troponin 11/09/2016  . Metabolic syndrome 36/62/9476    Past Surgical History:  Procedure Laterality Date  . ABDOMINAL HYSTERECTOMY    . APPENDECTOMY    . eyelid surgery    . HERNIA REPAIR    . right knee surgery      Prior to Admission medications   Medication Sig Start Date End Date Taking? Authorizing Provider  apixaban (ELIQUIS) 2.5 MG TABS tablet Take 1 tablet (2.5 mg total) by mouth 2 (two) times daily. 06/28/19   Lavina Hamman, MD  atorvastatin (LIPITOR) 80 MG  tablet Take 1 tablet (80 mg total) by mouth daily. 06/29/19   Lavina Hamman, MD  b complex-vitamin c-folic acid (NEPHRO-VITE) 0.8 MG TABS tablet Take 1 tablet by mouth daily. 09/27/16   [provider]  bisacodyl (DULCOLAX) 10 MG suppository Place 1 suppository (10 mg total) rectally daily as needed for moderate constipation. 10/05/20   Cherene Altes, MD  carvedilol (COREG) 12.5 MG tablet Take 1 tablet (12.5 mg total) by mouth 2 (two) times daily with a meal. 10/05/20   Cherene Altes, MD   cinacalcet (SENSIPAR) 60 MG tablet Take 60 mg by mouth 2 (two) times daily.    [provider]  epoetin alfa (EPOGEN) 4000 UNIT/ML injection Inject 1 mL (4,000 Units total) into the vein Every Tuesday,Thursday,and Saturday with dialysis. 10/05/20   Cherene Altes, MD  gabapentin (NEURONTIN) 300 MG capsule Take 300 mg by mouth 3 (three) times daily. 07/27/20   [provider]  hydroxychloroquine (PLAQUENIL) 200 MG tablet Take 200 mg by mouth daily. 06/30/20   [provider]  midodrine (PROAMATINE) 10 MG tablet Take 1 tablet (10 mg total) by mouth 3 (three) times daily with meals. 06/28/19   Lavina Hamman, MD  polyethylene glycol (MIRALAX / GLYCOLAX) 17 g packet Take 17 g by mouth 2 (two) times daily. 10/05/20   Cherene Altes, MD  sevelamer carbonate (RENVELA) 800 MG tablet Take 800 mg by mouth 4 (four) times daily.    [provider]    Allergies Shellfish allergy  Family History  Problem Relation Age of Onset  . Seizures Mother   . Hypertension Mother   . Throat cancer Father   . Breast cancer Neg Hx     Social History Social History   Tobacco Use  . Smoking status: Former Research scientist (life sciences)  . Smokeless tobacco: Never Used  . Tobacco comment: quit in 2007  Substance Use Topics  . Alcohol use: No  . Drug use: No    Review of Systems Constitutional: No fever/chills Eyes: No visual changes. ENT: No sore throat. Cardiovascular: Denies chest pain. Respiratory: Denies shortness of breath. Gastrointestinal: No nausea, no vomiting.  No diarrhea.  Endorses left lower quadrant and rectal pain Genitourinary: Negative for dysuria. Musculoskeletal: Negative for acute arthralgias Skin: Negative for rash. Neurological: Negative for headaches, weakness/numbness/paresthesias in any extremity Psychiatric: Negative for suicidal ideation/homicidal ideation   ____________________________________________   PHYSICAL EXAM:  VITAL SIGNS: ED Triage  Vitals  Enc Vitals Group     BP 11/22/20 1824 (!) 169/75     Pulse Rate 11/22/20 1824 63     Resp 11/22/20 1824 16     Temp 11/22/20 1824 97.7 F (36.5 C)     Temp src --      SpO2 11/22/20 1824 100 %     Weight 11/22/20 1824 265 lb (120.2 kg)     Height 11/22/20 1824 5' 6" (1.676 m)     Head Circumference --      Peak Flow --      Pain Score 11/22/20 1848 9     Pain Loc --      Pain Edu? --      Excl. in [place]? --    Constitutional: Alert and oriented. Well appearing morbidly obese elderly African-American female in no acute distress. Eyes: Conjunctivae are normal. PERRL. Head: Atraumatic. Nose: No congestion/rhinnorhea. Mouth/Throat: Mucous membranes are moist. Neck: No stridor Cardiovascular: Grossly normal heart sounds.  Good peripheral circulation. Respiratory: Normal respiratory effort.  No retractions. Gastrointestinal: Soft and nontender. No distention. Rectal: External hemorrhoids with no signs of thrombosis and small amount of bleeding Musculoskeletal: No obvious deformities Neurologic:  Normal speech and language. No gross focal neurologic deficits are appreciated. Skin:  Skin is warm and dry. No rash noted. Psychiatric: Mood and affect are normal. Speech and behavior are normal.  ____________________________________________   LABS (all labs ordered are listed, but only abnormal results are displayed)  Labs Reviewed  COMPREHENSIVE METABOLIC PANEL - Abnormal; Notable for the following components:      Result Value   BUN 27 (*)    Creatinine, Ser 4.55 (*)    Calcium 11.1 (*)    AST 14 (*)    GFR, Estimated 10 (*)    All other components within normal limits  CBC - Abnormal; Notable for the following components:   RBC 3.32 (*)    Hemoglobin 10.6 (*)    HCT 34.4 (*)    MCV 103.6 (*)    RDW 17.3 (*)    All other components within normal limits  LIPASE, BLOOD   PROCEDURES  Procedure(s) performed (including Critical  Care):  Procedures   ____________________________________________   INITIAL IMPRESSION / ASSESSMENT AND PLAN / ED COURSE  As part of my medical decision making, I reviewed the following data within the [place] notes reviewed and incorporated, Labs reviewed, EKG interpreted, Old chart reviewed, and Notes from prior ED visits reviewed and incorporated     Patients history and exam most consistent with hemorrhoid as an etiology for their pain.  Exam not consistent with thrombosed hemorrhoid. Patients symptoms and exam not typical for other emergent causes of rectal pain such as, but not limited to, anorectal abscess, rectal foreign body, anal fissure, anal fistula, proctitis, rectal prolapse.  Rx: Bowel regimen, topical lidocaine, sitz bath 28mn TID and after each bowel movement  Disposition:  Patient will be discharged with strict return precautions and follow up with primary MD within 24-48 hours for further evaluation.      ____________________________________________   FINAL CLINICAL IMPRESSION(S) / ED DIAGNOSES  Final diagnoses:  External hemorrhoids without complication     ED Discharge Orders    None       Note:  This document was prepared using Dragon voice recognition software and may include unintentional dictation errors.   BNaaman Plummer MD 11/23/20 [id number]

## 2020-11-23 NOTE — ED Notes (Signed)
Called EMS for transport back to [facility] 

## 2020-11-24 ENCOUNTER — Other Ambulatory Visit: Payer: Self-pay

## 2020-11-24 ENCOUNTER — Emergency Department
Admission: EM | Admit: 2020-11-24 | Discharge: 2020-11-24 | Disposition: A | Discharge status: Home / Self Care | Payer: Medicare Other | Attending: Emergency Medicine | Admitting: Emergency Medicine

## 2020-11-24 ENCOUNTER — Telehealth: Payer: Self-pay | Admitting: Internal Medicine

## 2020-11-24 DIAGNOSIS — Z79899 Other long term (current) drug therapy: Secondary | ICD-10-CM | POA: Diagnosis not present

## 2020-11-24 DIAGNOSIS — N186 End stage renal disease: Secondary | ICD-10-CM | POA: Insufficient documentation

## 2020-11-24 DIAGNOSIS — I132 Hypertensive heart and chronic kidney disease with heart failure and with stage 5 chronic kidney disease, or end stage renal disease: Secondary | ICD-10-CM | POA: Insufficient documentation

## 2020-11-24 DIAGNOSIS — Z87891 Personal history of nicotine dependence: Secondary | ICD-10-CM | POA: Insufficient documentation

## 2020-11-24 DIAGNOSIS — K649 Unspecified hemorrhoids: Secondary | ICD-10-CM | POA: Insufficient documentation

## 2020-11-24 DIAGNOSIS — Z992 Dependence on renal dialysis: Secondary | ICD-10-CM | POA: Diagnosis not present

## 2020-11-24 DIAGNOSIS — Z7901 Long term (current) use of anticoagulants: Secondary | ICD-10-CM | POA: Insufficient documentation

## 2020-11-24 DIAGNOSIS — I5033 Acute on chronic diastolic (congestive) heart failure: Secondary | ICD-10-CM | POA: Insufficient documentation

## 2020-11-24 DIAGNOSIS — Z8616 Personal history of COVID-19: Secondary | ICD-10-CM | POA: Diagnosis not present

## 2020-11-24 DIAGNOSIS — Z8543 Personal history of malignant neoplasm of ovary: Secondary | ICD-10-CM | POA: Insufficient documentation

## 2020-11-24 DIAGNOSIS — K625 Hemorrhage of anus and rectum: Secondary | ICD-10-CM | POA: Insufficient documentation

## 2020-11-24 LAB — COMPREHENSIVE METABOLIC PANEL
ALT: 14 U/L (ref 0–44)
AST: 26 U/L (ref 15–41)
Albumin: 3.6 g/dL (ref 3.5–5.0)
Alkaline Phosphatase: 47 U/L (ref 38–126)
Anion gap: 13 (ref 5–15)
BUN: 40 mg/dL — ABNORMAL HIGH (ref 8–23)
CO2: 27 mmol/L (ref 22–32)
Calcium: 11.6 mg/dL — ABNORMAL HIGH (ref 8.9–10.3)
Chloride: 96 mmol/L — ABNORMAL LOW (ref 98–111)
Creatinine, Ser: 6.47 mg/dL — ABNORMAL HIGH (ref 0.44–1.00)
GFR, Estimated: 6 mL/min — ABNORMAL LOW (ref >60)
Glucose, Bld: 94 mg/dL (ref 70–99)
Potassium: 5.6 mmol/L — ABNORMAL HIGH (ref 3.5–5.1)
Sodium: 136 mmol/L (ref 135–145)
Total Bilirubin: 1.1 mg/dL (ref 0.3–1.2)
Total Protein: 8 g/dL (ref 6.5–8.1)

## 2020-11-24 LAB — CBC
HCT: 36.1 % (ref 36.0–46.0)
Hemoglobin: 11.6 g/dL — ABNORMAL LOW (ref 12.0–15.0)
MCH: 32.5 pg (ref 26.0–34.0)
MCHC: 32.1 g/dL (ref 30.0–36.0)
MCV: 101.1 fL — ABNORMAL HIGH (ref 80.0–100.0)
Platelets: 175 10*3/uL (ref 150–400)
RBC: 3.57 MIL/uL — ABNORMAL LOW (ref 3.87–5.11)
RDW: 17.3 % — ABNORMAL HIGH (ref 11.5–15.5)
WBC: 7.1 10*3/uL (ref 4.0–10.5)
nRBC: 0 % (ref 0.0–0.2)

## 2020-11-24 MED ORDER — PATIROMER SORBITEX CALCIUM 8.4 G PO PACK
16.8000 g | PACK | Freq: Every day | ORAL | Status: DC
  Administered 2020-11-24: 16.8 g via ORAL
  Filled 2020-11-24: qty 2

## 2020-11-24 MED ORDER — HYDROCORTISONE 1 % EX CREA: 1.0000 "application " | TOPICAL_CREAM | Freq: Two times a day (BID) | CUTANEOUS | 0 refills | Status: DC

## 2020-11-24 NOTE — Progress Notes (Signed)
Contacted by Dr. Juleen China to reroute patient to dialysis center. Patient is known at [place] and rides with [facility]. Patient is scheduled for an extra treatment tomorrow at 10:00am and will need to call transportation to get a ride. Please contact me with any dialysis placement concerns.  [name] Dialysis Coordinator [phone]

## 2020-11-24 NOTE — ED Notes (Signed)
Attempted IV access , unsuccessful, was able to obtain labs

## 2020-11-24 NOTE — ED Notes (Signed)
Patient had large BM upon rectal exam. Patient clean up by this RN and Gabby NT. New brief and pad changed.

## 2020-11-24 NOTE — ED Notes (Signed)
Patient sleeping at this time. Awaiting transport. No signs of distress.

## 2020-11-24 NOTE — Telephone Encounter (Signed)
Pt went to ER and is doing dialysis tomorrow we changed all her appts. Magda Paganini at [facility] is aware and also talked to daughter about appts. All should be good for the moment.

## 2020-11-24 NOTE — ED Notes (Signed)
This tech,Shan, EDT and Ria Comment, RN provided pt with inc/peri care. Dry brief placed on. No other needs found a this moment.

## 2020-11-24 NOTE — ED Provider Notes (Signed)
[facility] Emergency Department Provider Note   ____________________________________________   Event Date/Time   First MD Initiated Contact with Patient 11/24/20 1016     (approximate)  I have reviewed the triage vital signs and the nursing notes.   HISTORY  Chief Complaint GI Bleeding    HPI Cassandra Joseph is a 75 y.o. female with past medical history of ESRD on HD, diabetes, rheumatoid arthritis, and CHF who presents to the ED for rectal bleeding.  Patient reports a couple of days of rectal pain as well as intermittent bleeding.  She was seen in the ED 2 days ago and diagnosed with hemorrhoids, prescribed suppositories and told to use it past.  Daughter states that staff at [facility] healthcare have tried to use the suppositories but they have "not stayed in."  Patient continues to have bright red blood per rectum along with rectal pain.  She denies any abdominal pain and has not had any nausea or vomiting.  She does not take any blood thinners.  She is due to receive dialysis today but missed her appointment at 11 AM.  She last received a full run of dialysis 3 days ago.        Past Medical History:  Diagnosis Date  . 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 CHF (congestive heart failure) ([place])   . ESRD (end stage renal disease) ([place])    a. on HD x 11 years; b. felt to be 2/2 HTN; c. HD TTS  . History of nuclear stress test    a. 08/2015: probably normal myocardial perfusion study, no evidence for significant ischemia or scar was noted, during stress, global systolic function normal, EF 54%, RV borderline dilatation, coronary artery calcifications and marked mitral annular calcifications were noted, liver calcifications were noted on attenuation CT scan likely represent sequelae of previous granulomatous disease  . HLD (hyperlipidemia)   . Hypertension   . Ischemic stroke ([employer])    a. 03/2014  . Metabolic syndrome   . Morbid obesity ([place])   .  Multiple myeloma (HCC)    a. in remission  . Osteomyelitis ([place])    a. 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 Rheumatoid arthritis ([place])   . TIA (transient ischemic attack)    a. 2014    Patient Active Problem List   Diagnosis Date Noted  . Effusion of left hip 09/28/2020  . Septic arthritis ([place]) 09/27/2020  . Rheumatoid arthritis with positive rheumatoid factor ([place]) 09/27/2020  . Left leg pain   . Viral pneumonia 08/08/2020  . Pneumonia due to COVID-19 virus 08/07/2020  . History of anemia due to chronic kidney disease   . ESRD (end stage renal disease) ([place])   . SOB (shortness of breath)   . Acute pulmonary edema ([place]) 06/23/2019  . Acute on chronic diastolic CHF (congestive heart failure) ([place]) 01/18/2017  . HLD (hyperlipidemia) 01/18/2017  . Atypical chest pain 11/09/2016  . ESRD on hemodialysis ([place]) 11/09/2016  . Morbid obesity ([place]) 11/09/2016  . Essential hypertension 11/09/2016  . Multiple myeloma ([place]) 11/09/2016  . Elevated troponin 11/09/2016  . Metabolic syndrome 78/24/2353    Past Surgical History:  Procedure Laterality Date  . ABDOMINAL HYSTERECTOMY    . APPENDECTOMY    . eyelid surgery    . HERNIA REPAIR    . right knee surgery      Prior to Admission medications   Medication Sig Start Date End Date Taking? Authorizing Provider  hydrocortisone cream 1 % Apply 1 application topically 2 (two) times daily. 11/24/20  Yes Blake Divine, MD  apixaban (ELIQUIS) 2.5 MG TABS tablet Take 1 tablet (2.5 mg total) by mouth 2 (two) times daily. 06/28/19   Lavina Hamman, MD  atorvastatin (LIPITOR) 80 MG tablet Take 1 tablet (80 mg total) by mouth daily. 06/29/19   Lavina Hamman, MD  b complex-vitamin c-folic acid (NEPHRO-VITE) 0.8 MG TABS tablet Take 1 tablet by mouth daily. 09/27/16   [provider]  bisacodyl (DULCOLAX) 10 MG suppository Place 1 suppository (10 mg total) rectally daily as needed for moderate constipation. 10/05/20   Cherene Altes,  MD  carvedilol (COREG) 12.5 MG tablet Take 1 tablet (12.5 mg total) by mouth 2 (two) times daily with a meal. 10/05/20   Cherene Altes, MD  cinacalcet (SENSIPAR) 60 MG tablet Take 60 mg by mouth 2 (two) times daily.    [provider]  epoetin alfa (EPOGEN) 4000 UNIT/ML injection Inject 1 mL (4,000 Units total) into the vein Every Tuesday,Thursday,and Saturday with dialysis. 10/05/20   Cherene Altes, MD  gabapentin (NEURONTIN) 300 MG capsule Take 300 mg by mouth 3 (three) times daily. 07/27/20   [provider]  hydroxychloroquine (PLAQUENIL) 200 MG tablet Take 200 mg by mouth daily. 06/30/20   [provider]  midodrine (PROAMATINE) 10 MG tablet Take 1 tablet (10 mg total) by mouth 3 (three) times daily with meals. 06/28/19   Lavina Hamman, MD  polyethylene glycol (MIRALAX / GLYCOLAX) 17 g packet Take 17 g by mouth 2 (two) times daily. 10/05/20   Cherene Altes, MD  sevelamer carbonate (RENVELA) 800 MG tablet Take 800 mg by mouth 4 (four) times daily.    [provider]    Allergies Shellfish allergy  Family History  Problem Relation Age of Onset  . Seizures Mother   . Hypertension Mother   . Throat cancer Father   . Breast cancer Neg Hx     Social History Social History   Tobacco Use  . Smoking status: Former Research scientist (life sciences)  . Smokeless tobacco: Never Used  . Tobacco comment: quit in 2007  Substance Use Topics  . Alcohol use: No  . Drug use: No    Review of Systems  Constitutional: No fever/chills Eyes: No visual changes. ENT: No sore throat. Cardiovascular: Denies chest pain. Respiratory: Denies shortness of breath. Gastrointestinal: No abdominal pain.  No nausea, no vomiting.  No diarrhea.  No constipation.  Positive for rectal bleeding and pain. Genitourinary: Negative for dysuria. Musculoskeletal: Negative for back pain. Skin: Negative for rash. Neurological: Negative for headaches, focal weakness or  numbness.  ____________________________________________   PHYSICAL EXAM:  VITAL SIGNS: ED Triage Vitals  Enc Vitals Group     BP 11/24/20 1021 (!) 159/68     Pulse Rate 11/24/20 1021 60     Resp 11/24/20 1021 18     Temp 11/24/20 1021 97.8 F (36.6 C)     Temp Source 11/24/20 1021 Oral     SpO2 11/24/20 1021 98 %     Weight 11/24/20 1023 298 lb 1.6 oz (135.2 kg)     Height --      Head Circumference --      Peak Flow --      Pain Score 11/24/20 1023 9     Pain Loc --      Pain Edu? --      Excl. in [place]? --  Constitutional: Alert and oriented. Eyes: Conjunctivae are normal. Head: Atraumatic. Nose: No congestion/rhinnorhea. Mouth/Throat: Mucous membranes are moist. Neck: Normal ROM Cardiovascular: Normal rate, regular rhythm. Grossly normal heart sounds.  Right forearm AV fistula with palpable thrill. Respiratory: Normal respiratory effort.  No retractions. Lungs CTAB. Gastrointestinal: Soft and nontender. No distention.  Bleeding hemorrhoids noted with no thrombosis. Genitourinary: deferred Musculoskeletal: No lower extremity tenderness nor edema. Neurologic:  Normal speech and language. No gross focal neurologic deficits are appreciated. Skin:  Skin is warm, dry and intact. No rash noted. Psychiatric: Mood and affect are normal. Speech and behavior are normal.  ____________________________________________   LABS (all labs ordered are listed, but only abnormal results are displayed)  Labs Reviewed  COMPREHENSIVE METABOLIC PANEL - Abnormal; Notable for the following components:      Result Value   Potassium 5.6 (*)    Chloride 96 (*)    BUN 40 (*)    Creatinine, Ser 6.47 (*)    Calcium 11.6 (*)    GFR, Estimated 6 (*)    All other components within normal limits  CBC - Abnormal; Notable for the following components:   RBC 3.57 (*)    Hemoglobin 11.6 (*)    MCV 101.1 (*)    RDW 17.3 (*)    All other components within normal limits  POC OCCULT BLOOD, ED   TYPE AND SCREEN    PROCEDURES  Procedure(s) performed (including Critical Care):  Procedures   ____________________________________________   INITIAL IMPRESSION / ASSESSMENT AND PLAN / ED COURSE       75 year old female with past medical history of ESRD on HD (TTS), rheumatoid arthritis, CHF, and diabetes who presents to the ED complaining of 2 days of rectal bleeding along with rectal pain.  She has bleeding hemorrhoids on rectal examination with no evidence of thrombosis.  Lab work shows stable H&H and I doubt significant GI bleed.  Given she has had difficulty with the suppositories we will treat with steroid cream and provide with referral to general surgery for further management.  Patient is due for dialysis today and does have mildly elevated potassium at 5.6.  Case discussed with Dr. Juleen China of nephrology, who recommends giving dose of V[place] while she is in the ED.  Dialysis liaison was consulted and arranged for patient to receive a run of dialysis tomorrow morning.  She is appropriate for discharge back to nursing facility with plan for routine dialysis tomorrow and general surgery follow-up.  Patient and family agree with plan.      ____________________________________________   FINAL CLINICAL IMPRESSION(S) / ED DIAGNOSES  Final diagnoses:  Hemorrhoids, unspecified hemorrhoid type  ESRD (end stage renal disease) [facility])     ED Discharge Orders         Ordered    hydrocortisone cream 1 %  2 times daily        11/24/20 1215           Note:  This document was prepared using Dragon voice recognition software and may include unintentional dictation errors.   Blake Divine, MD 11/24/20 1450

## 2020-11-24 NOTE — ED Triage Notes (Signed)
Pt comes into the ED via EMS from [facility] care with c/o rectal pain with bright red bleeding today. Pt is dialysis pt that is due for routine dialysis today, also reports pt had a fall yesterday with no complaints of injury.

## 2020-11-25 ENCOUNTER — Ambulatory Visit: Admission: RE | Admit: 2020-11-25 | Payer: Medicare Other | Source: Ambulatory Visit

## 2020-11-25 ENCOUNTER — Inpatient Hospital Stay: Payer: Medicare Other | Admitting: Internal Medicine

## 2020-12-02 ENCOUNTER — Encounter: Admit: 2020-12-02 | Payer: Medicare Other

## 2020-12-04 ENCOUNTER — Inpatient Hospital Stay: Payer: Medicare Other | Admitting: Internal Medicine

## 2020-12-09 ENCOUNTER — Ambulatory Visit
Admission: RE | Admit: 2020-12-09 | Discharge: 2020-12-09 | Disposition: A | Discharge status: Home / Self Care | Payer: Medicare Other | Source: Ambulatory Visit | Attending: Internal Medicine | Admitting: Internal Medicine

## 2020-12-09 DIAGNOSIS — C9002 Multiple myeloma in relapse: Secondary | ICD-10-CM | POA: Insufficient documentation

## 2020-12-09 DIAGNOSIS — E041 Nontoxic single thyroid nodule: Secondary | ICD-10-CM | POA: Diagnosis not present

## 2020-12-09 DIAGNOSIS — I700 Atherosclerosis of aorta: Secondary | ICD-10-CM | POA: Diagnosis not present

## 2020-12-09 DIAGNOSIS — I517 Cardiomegaly: Secondary | ICD-10-CM | POA: Insufficient documentation

## 2020-12-09 LAB — GLUCOSE, CAPILLARY: Glucose-Capillary: 77 mg/dL (ref 70–99)

## 2020-12-09 MED ORDER — FLUDEOXYGLUCOSE F - 18 (FDG) INJECTION
13.0000 | Freq: Once | INTRAVENOUS | Status: AC | PRN
  Administered 2020-12-09: 13.66 via INTRAVENOUS

## 2020-12-10 ENCOUNTER — Other Ambulatory Visit: Payer: Self-pay | Admitting: *Deleted

## 2020-12-10 DIAGNOSIS — I5033 Acute on chronic diastolic (congestive) heart failure: Secondary | ICD-10-CM

## 2020-12-10 DIAGNOSIS — C9002 Multiple myeloma in relapse: Secondary | ICD-10-CM

## 2020-12-11 ENCOUNTER — Inpatient Hospital Stay: Payer: No Typology Code available for payment source | Attending: Internal Medicine | Admitting: Internal Medicine

## 2020-12-11 ENCOUNTER — Inpatient Hospital Stay: Payer: No Typology Code available for payment source

## 2020-12-11 DIAGNOSIS — N2581 Secondary hyperparathyroidism of renal origin: Secondary | ICD-10-CM | POA: Insufficient documentation

## 2020-12-11 DIAGNOSIS — C9002 Multiple myeloma in relapse: Secondary | ICD-10-CM

## 2020-12-11 DIAGNOSIS — Z9049 Acquired absence of other specified parts of digestive tract: Secondary | ICD-10-CM | POA: Diagnosis not present

## 2020-12-11 DIAGNOSIS — M549 Dorsalgia, unspecified: Secondary | ICD-10-CM | POA: Insufficient documentation

## 2020-12-11 DIAGNOSIS — Z992 Dependence on renal dialysis: Secondary | ICD-10-CM | POA: Insufficient documentation

## 2020-12-11 DIAGNOSIS — N186 End stage renal disease: Secondary | ICD-10-CM | POA: Diagnosis not present

## 2020-12-11 DIAGNOSIS — M2550 Pain in unspecified joint: Secondary | ICD-10-CM | POA: Diagnosis not present

## 2020-12-11 DIAGNOSIS — G8929 Other chronic pain: Secondary | ICD-10-CM | POA: Insufficient documentation

## 2020-12-11 DIAGNOSIS — D631 Anemia in chronic kidney disease: Secondary | ICD-10-CM | POA: Insufficient documentation

## 2020-12-11 DIAGNOSIS — C9000 Multiple myeloma not having achieved remission: Secondary | ICD-10-CM | POA: Insufficient documentation

## 2020-12-11 LAB — BASIC METABOLIC PANEL
Anion gap: 11 (ref 5–15)
BUN: 27 mg/dL — ABNORMAL HIGH (ref 8–23)
CO2: 29 mmol/L (ref 22–32)
Calcium: 9.5 mg/dL (ref 8.9–10.3)
Chloride: 96 mmol/L — ABNORMAL LOW (ref 98–111)
Creatinine, Ser: 4.87 mg/dL — ABNORMAL HIGH (ref 0.44–1.00)
GFR, Estimated: 9 mL/min — ABNORMAL LOW (ref >60)
Glucose, Bld: 97 mg/dL (ref 70–99)
Potassium: 4.1 mmol/L (ref 3.5–5.1)
Sodium: 136 mmol/L (ref 135–145)

## 2020-12-11 LAB — CBC WITH DIFFERENTIAL/PLATELET
Abs Immature Granulocytes: 0.01 10*3/uL (ref 0.00–0.07)
Basophils Absolute: 0.1 10*3/uL (ref 0.0–0.1)
Basophils Relative: 1 %
Eosinophils Absolute: 0.1 10*3/uL (ref 0.0–0.5)
Eosinophils Relative: 3 %
HCT: 37.9 % (ref 36.0–46.0)
Hemoglobin: 11.7 g/dL — ABNORMAL LOW (ref 12.0–15.0)
Immature Granulocytes: 0 %
Lymphocytes Relative: 29 %
Lymphs Abs: 1.3 10*3/uL (ref 0.7–4.0)
MCH: 31.6 pg (ref 26.0–34.0)
MCHC: 30.9 g/dL (ref 30.0–36.0)
MCV: 102.4 fL — ABNORMAL HIGH (ref 80.0–100.0)
Monocytes Absolute: 0.6 10*3/uL (ref 0.1–1.0)
Monocytes Relative: 13 %
Neutro Abs: 2.5 10*3/uL (ref 1.7–7.7)
Neutrophils Relative %: 54 %
Platelets: 186 10*3/uL (ref 150–400)
RBC: 3.7 MIL/uL — ABNORMAL LOW (ref 3.87–5.11)
RDW: 15.5 % (ref 11.5–15.5)
WBC: 4.6 10*3/uL (ref 4.0–10.5)
nRBC: 0 % (ref 0.0–0.2)

## 2020-12-11 MED ORDER — DENOSUMAB 120 MG/1.7ML ~~LOC~~ SOLN
120.0000 mg | Freq: Once | SUBCUTANEOUS | Status: AC
Start: 2020-12-11 — End: 2020-12-11
  Administered 2020-12-11: 120 mg via SUBCUTANEOUS
  Filled 2020-12-11: qty 1.7

## 2020-12-11 NOTE — Progress Notes (Signed)
[facility] NOTE  Patient Care Team: Tracie Harrier, MD as PCP - General (Internal Medicine)  CHIEF COMPLAINTS/PURPOSE OF CONSULTATION: Multiple myeloma  #  Oncology History Overview Note     With regards to multiple myeloma followed by Dr. Inez Pilgrim. Bone marrow biopsy repeat [facility] October 06, 2005 29% plasma cells-[[facility]]s/p Velcade Decadron; discontinued Velcade in July 2007.  Recommend surveillance.  However review of records reveal patient has not followed up either at [facility] oncology or [facility].  # The marrow is variably cellular. There is a mild increase in plasma  cells (10% aspirate and CD138 immunohistochemistry) with atypia and  kappa excess. These findings are consistent with continued mild  involvement by the patient's plasma cell neoplasm. While significant  dysplasia is not seen, MDS FISH will be ordered given the pockets of  highly cellular marrow. MAY 2022- PET NED.   # 2020 [[facility]; Dr.Kim]- Secondary hyperparathyroidism. PTH levels have been greater than 2000 on multiple occasions.  Plan: I will follow-up on the thyroid biopsy. She will need a subtotal versus total parathyroidectomy. We may need to perform a left thyroid lobectomy even if the lesion is benign. This will be dictated by intraoperative findings. Of course if cancer is seen then she will need a lobectomy or even a total thyroidectomy. In this patient I would prefer a subtotal parathyroidectomy. Surgery is tentatively scheduled for mid May. ------------------------------------------------------    #April 2022-Hypercalcemia- ?TERTIARY HYPERPARATHYROIDISM- calcium between 11 and 12-unclear etiology; pth ~ 200S; ELEVATED. -no lytic lesions noted on PET scan/no concerns for any other malignancy.  [ pamidronate 60 mg over 6 hours on 10/02/2020 X-geva.]   Multiple myeloma ([place])  11/09/2016 Initial Diagnosis   Multiple myeloma ([place])      HISTORY OF PRESENTING ILLNESS:  Cassandra Joseph 75 y.o.  female  patient with multiple medical problems-including end-stage renal disease on dialysis; remote history of multiple myeloma; was recently admitted to hospital for worsening hip pain/joint effusion..  Patient is status post aspiration. Patient had a fall prior to admission which led to a CT scan of the head noncontrast-which showed 8 mm lytic lesion.  Of note patient's calcium was also elevated between 10-12.  Patient hemoglobin ~9.  Patient received IV pamidronate in the hospital.  Patient also underwent bone marrow biopsy-further evaluation for possible multiple myeloma.  She is currently at home.  She currently accompanied by daughter.  Chronic joint pains back pain not any worse.  Review of Systems  Constitutional: Positive for malaise/fatigue. Negative for chills, diaphoresis and fever.  HENT: Negative for nosebleeds and sore throat.   Eyes: Negative for double vision.  Respiratory: Negative for cough, hemoptysis, sputum production, shortness of breath and wheezing.   Cardiovascular: Negative for chest pain, palpitations, orthopnea and leg swelling.  Gastrointestinal: Negative for abdominal pain, blood in stool, constipation, diarrhea, heartburn, melena, nausea and vomiting.  Genitourinary: Negative for dysuria, frequency and urgency.  Musculoskeletal: Positive for back pain and joint pain.  Skin: Negative.  Negative for itching and rash.  Neurological: Negative for dizziness, tingling, focal weakness, weakness and headaches.  Endo/Heme/Allergies: Does not bruise/bleed easily.  Psychiatric/Behavioral: Negative for depression. The patient is not nervous/anxious and does not have insomnia.      MEDICAL HISTORY:  Past Medical History:  Diagnosis Date  . 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 CHF (congestive heart failure) ([place])   . ESRD (end stage renal disease) ([place])    a. on HD x 11 years;  b. felt to be 2/2 HTN; c. HD TTS  . History of nuclear stress test    a. 08/2015: probably  normal myocardial perfusion study, no evidence for significant ischemia or scar was noted, during stress, global systolic function normal, EF 54%, RV borderline dilatation, coronary artery calcifications and marked mitral annular calcifications were noted, liver calcifications were noted on attenuation CT scan likely represent sequelae of previous granulomatous disease  . HLD (hyperlipidemia)   . Hypertension   . Ischemic stroke ([place])    a. 03/2014  . Metabolic syndrome   . Morbid obesity ([place])   . Multiple myeloma (HCC)    a. in remission  . Osteomyelitis ([employer])    a. 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 Rheumatoid arthritis ([place])   . TIA (transient ischemic attack)    a. 2014    SURGICAL HISTORY: Past Surgical History:  Procedure Laterality Date  . ABDOMINAL HYSTERECTOMY    . APPENDECTOMY    . eyelid surgery    . HERNIA REPAIR    . right knee surgery      SOCIAL HISTORY: Social History   Socioeconomic History  . Marital status: Married    Spouse name: Not on file  . Number of children: Not on file  . Years of education: Not on file  . Highest education level: Not on file  Occupational History  . Not on file  Tobacco Use  . Smoking status: Former Research scientist (life sciences)  . Smokeless tobacco: Never Used  . Tobacco comment: quit in 2007  Substance and Sexual Activity  . Alcohol use: No  . Drug use: No  . Sexual activity: Not Currently  Other Topics Concern  . Not on file  Social History Narrative   Independent at baseline, ambulates with a walker   Social Determinants of Health   Financial Resource Strain: Not on file  Food Insecurity: Not on file  Transportation Needs: Not on file  Physical Activity: Not on file  Stress: Not on file  Social Connections: Not on file  Intimate Partner Violence: Not on file    FAMILY HISTORY: Family History  Problem Relation Age of Onset  . Seizures Mother   . Hypertension Mother   . Throat cancer Father   . Breast cancer Neg Hx      ALLERGIES:  is allergic to shellfish allergy.  MEDICATIONS:  Current Outpatient Medications  Medication Sig Dispense Refill  . amLODipine (NORVASC) 10 MG tablet Take 1 tablet by mouth daily.    Marland Kitchen apixaban (ELIQUIS) 2.5 MG TABS tablet Take 1 tablet (2.5 mg total) by mouth 2 (two) times daily. 60 tablet 0  . atorvastatin (LIPITOR) 80 MG tablet Take 1 tablet (80 mg total) by mouth daily. 30 tablet 0  . b complex-vitamin c-folic acid (NEPHRO-VITE) 0.8 MG TABS tablet Take 1 tablet by mouth daily.  11  . bisacodyl (DULCOLAX) 10 MG suppository Place 1 suppository (10 mg total) rectally daily as needed for moderate constipation. 12 suppository 0  . carvedilol (COREG) 12.5 MG tablet Take 1 tablet (12.5 mg total) by mouth 2 (two) times daily with a meal.    . cinacalcet (SENSIPAR) 60 MG tablet Take 60 mg by mouth 2 (two) times daily.    Marland Kitchen epoetin alfa (EPOGEN) 4000 UNIT/ML injection Inject 1 mL (4,000 Units total) into the vein Every Tuesday,Thursday,and Saturday with dialysis. 1 mL   . gabapentin (NEURONTIN) 300 MG capsule Take 300 mg by mouth 3 (three) times daily.    Marland Kitchen  hydrocortisone cream 1 % Apply 1 application topically 2 (two) times daily. 30 g 0  . Hydrocortisone, Perianal, 1 % CREA Apply topically.    . hydroxychloroquine (PLAQUENIL) 200 MG tablet Take 200 mg by mouth daily.    . midodrine (PROAMATINE) 10 MG tablet Take 1 tablet (10 mg total) by mouth 3 (three) times daily with meals. 90 tablet 0  . polyethylene glycol (MIRALAX / GLYCOLAX) 17 g packet Take 17 g by mouth 2 (two) times daily. 14 each 0  . senna (SENOKOT) 8.6 MG tablet Take 2 tablets by mouth at bedtime.    . sevelamer carbonate (RENVELA) 800 MG tablet Take 800 mg by mouth 4 (four) times daily.     No current facility-administered medications for this visit.      Marland Kitchen  PHYSICAL EXAMINATION: ECOG PERFORMANCE STATUS: 1 - Symptomatic but completely ambulatory  Vitals:   12/11/20 1140  BP: 128/65  Pulse: 72  Resp: 16   Temp: (!) 97.2 F (36.2 C)  SpO2: 100%   There were no vitals filed for this visit.  Physical Exam HENT:     Head: Normocephalic and atraumatic.     Mouth/Throat:     Pharynx: No oropharyngeal exudate.  Eyes:     Pupils: Pupils are equal, round, and reactive to light.  Cardiovascular:     Rate and Rhythm: Normal rate and regular rhythm.  Pulmonary:     Effort: No respiratory distress.     Breath sounds: No wheezing.  Abdominal:     General: Bowel sounds are normal. There is no distension.     Palpations: Abdomen is soft. There is no mass.     Tenderness: There is no abdominal tenderness. There is no guarding or rebound.  Musculoskeletal:        General: No tenderness. Normal range of motion.     Cervical back: Normal range of motion and neck supple.  Skin:    General: Skin is warm.  Neurological:     Mental Status: She is alert and oriented to person, place, and time.  Psychiatric:        Mood and Affect: Affect normal.      LABORATORY DATA:  I have reviewed the data as listed Lab Results  Component Value Date   WBC 4.6 12/11/2020   HGB 11.7 (L) 12/11/2020   HCT 37.9 12/11/2020   MCV 102.4 (H) 12/11/2020   PLT 186 12/11/2020   Recent Labs    10/26/20 1424 11/22/20 1835 11/24/20 1040 12/11/20 1102  NA 134* 136 136 136  K 4.3 4.3 5.6* 4.1  CL 92* 98 96* 96*  CO2 30 28 27 29   GLUCOSE 118* 85 94 97  BUN 34* 27* 40* 27*  CREATININE 5.49* 4.55* 6.47* 4.87*  CALCIUM 9.3 11.1* 11.6* 9.5  GFRNONAA 8* 10* 6* 9*  PROT 7.8 7.4 8.0  --   ALBUMIN 3.5 3.5 3.6  --   AST 15 14* 26  --   ALT 12 11 14   --   ALKPHOS 62 53 47  --   BILITOT 0.6 0.6 1.1  --     RADIOGRAPHIC STUDIES: I have personally reviewed the radiological images as listed and agreed with the findings in the report. NM PET Image Restage (PS) Whole Body  Result Date: 12/09/2020 CLINICAL DATA:  Subsequent treatment strategy for multiple myeloma. EXAM: NUCLEAR MEDICINE PET WHOLE BODY TECHNIQUE: 13.7  mCi F-18 FDG was injected intravenously. Full-ring PET imaging was performed from the head  to foot after the radiotracer. CT data was obtained and used for attenuation correction and anatomic localization. Fasting blood glucose: 77 mg/dl COMPARISON:  None. FINDINGS: Mediastinal blood pool activity: SUV max 3.5 HEAD/NECK: No hypermetabolism at the site of the left frontal skull lesion seen on head CT 09/27/2020.18 mm posterior left thyroid nodule with SUV max = 5.0. Incidental CT findings: none CHEST: No hypermetabolic hilar nodes. No suspicious pulmonary nodules on the CT scan.Upper normal mediastinal lymph nodes show low level hypermetabolism. 9 mm short axis right paratracheal node on 110/4 demonstrates SUV max = 3.3. Incidental CT findings: The heart is enlarged. Coronary artery calcification is evident. Atherosclerotic calcification is noted in the wall of the thoracic aorta. Bibasilar collapse/consolidation noted. ABDOMEN/PELVIS: No abnormal hypermetabolic activity within the liver, pancreas, adrenal glands, or spleen. No hypermetabolic lymph nodes in the abdomen or pelvis. Incidental CT findings: Kidneys are atrophic with innumerable low-density lesions bilaterally suggesting cyst. Mesenteric calcification in the right abdomen is nonspecific but shows no hypermetabolism. This may represent sequelae of prior infection or mesenteric infarct. SKELETON: No focal hypermetabolic activity to suggest skeletal metastasis. Incidental CT findings: Lucent lesion in the left frontal scalp described above, without hypermetabolism. 12 mm lucent lesion in the anterior left acetabulum shows no hypermetabolism. Mineralization in the bony pelvis is diffusely heterogeneous. EXTREMITIES: No abnormal hypermetabolic activity in the lower extremities. Incidental CT findings: none IMPRESSION: 1. Lucent lesion left frontal bone seen on recent head CT shows no hypermetabolism. No other hypermetabolic bone lesions on today's study. 2.  Borderline hypermetabolism in a small mediastinal lymph node, indeterminate. 3. 18 mm hypermetabolic posterior left thyroid nodule. Recommend thyroid US and biopsy (ref: J Am Coll Radiol. 2015 Feb;12(2): 143-50). 4.  Aortic Atherosclerois (ICD10-170.0) Electronically Signed   By: Misty Stanley M.D.   On: 12/09/2020 15:49    ASSESSMENT & PLAN:   Multiple myeloma ([place]) #75 year old female patient with multiple medical problems including end-stage renal disease on dialysis; history of multiple myeloma [2007]. MAY 2022-whole-body PET scan-does not show any hypermetabolism in the bones; bone marrow biopsy [April 2022]- ~10% plasma cells;-M protein 1.2 g/kappa lambda light chain ratio slightly abnormal.  #Based upon imaging/lab/pathology suspect patient has recurrent myeloma; however given the absence of any endorgan damage I think is reasonable to continue surveillance especially with her chronic multiple comorbidities.  #Hypercalcemia- calcium between 11 and 12-unclear etiology-no lytic lesions noted on PET scan/no concerns for any other malignancy.  Status post pamidronate 60 mg over 6 hours on 2/25.  Today calcium normal.  However proceed with X-geva today.   #End-stage renal disease/anemia [Dr.Singh]-on dialysis.  # DISPOSITION: #  X-geva today # follow up in 3 months MD; labs-cbc/bmp; MM panel;K/l light chains; possible x-geva;- Dr.B  All questions were answered. The patient knows to call the clinic with any problems, questions or concerns.    Cammie Sickle, MD 12/14/2020 8:27 AM

## 2020-12-11 NOTE — Assessment & Plan Note (Addendum)
#  75 year old female patient with multiple medical problems including end-stage renal disease on dialysis; history of multiple myeloma [2007]. MAY 2022-whole-body PET scan-does not show any hypermetabolism in the bones; bone marrow biopsy [April 2022]- ~10% plasma cells;-M protein 1.2 g/kappa lambda light chain ratio slightly abnormal.  #Based upon imaging/lab/pathology suspect patient has recurrent myeloma; however given the absence of any endorgan damage I think is reasonable to continue surveillance especially with her chronic multiple comorbidities.  #Hypercalcemia- calcium between 11 and 12-unclear etiology-no lytic lesions noted on PET scan/no concerns for any other malignancy.  Status post pamidronate 60 mg over 6 hours on 2/25.  Today calcium normal.  However proceed with X-geva today.   #End-stage renal disease/anemia [Dr.Singh]-on dialysis.  # DISPOSITION: #  X-geva today # follow up in 3 months MD; labs-cbc/bmp; MM panel;K/l light chains; possible x-geva;- Dr.B

## 2021-01-18 ENCOUNTER — Encounter: Payer: Self-pay | Admitting: Internal Medicine

## 2021-03-10 ENCOUNTER — Telehealth: Payer: Self-pay | Admitting: Internal Medicine

## 2021-03-10 NOTE — Telephone Encounter (Signed)
Pt daughter called and stated her mother can't come on Tuesdays and Thursday because of her Dialysis. She wanted to bring her this Friday, but Dr. B schedule is to capacity. Please advise as the patient is scheduled for labs, MD and Xgeva inj.

## 2021-03-10 NOTE — Telephone Encounter (Signed)
Per Dr. Jacinto Reap- ok to schedule with NP next week-

## 2021-03-11 ENCOUNTER — Inpatient Hospital Stay: Payer: Medicare Other

## 2021-03-11 ENCOUNTER — Inpatient Hospital Stay: Payer: Medicare Other | Admitting: Internal Medicine

## 2021-03-13 ENCOUNTER — Emergency Department
Admission: EM | Admit: 2021-03-13 | Discharge: 2021-03-14 | Disposition: A | Discharge status: Home / Self Care | Payer: Medicare Other | Attending: Emergency Medicine | Admitting: Emergency Medicine

## 2021-03-13 ENCOUNTER — Emergency Department: Payer: Medicare Other

## 2021-03-13 ENCOUNTER — Encounter: Payer: Self-pay | Admitting: Emergency Medicine

## 2021-03-13 ENCOUNTER — Other Ambulatory Visit: Payer: Self-pay

## 2021-03-13 DIAGNOSIS — Z87891 Personal history of nicotine dependence: Secondary | ICD-10-CM | POA: Insufficient documentation

## 2021-03-13 DIAGNOSIS — I5033 Acute on chronic diastolic (congestive) heart failure: Secondary | ICD-10-CM | POA: Insufficient documentation

## 2021-03-13 DIAGNOSIS — L089 Local infection of the skin and subcutaneous tissue, unspecified: Secondary | ICD-10-CM | POA: Diagnosis not present

## 2021-03-13 DIAGNOSIS — M79641 Pain in right hand: Secondary | ICD-10-CM | POA: Diagnosis present

## 2021-03-13 DIAGNOSIS — I132 Hypertensive heart and chronic kidney disease with heart failure and with stage 5 chronic kidney disease, or end stage renal disease: Secondary | ICD-10-CM | POA: Insufficient documentation

## 2021-03-13 DIAGNOSIS — Z992 Dependence on renal dialysis: Secondary | ICD-10-CM | POA: Insufficient documentation

## 2021-03-13 DIAGNOSIS — Z8616 Personal history of COVID-19: Secondary | ICD-10-CM | POA: Diagnosis not present

## 2021-03-13 DIAGNOSIS — Z7901 Long term (current) use of anticoagulants: Secondary | ICD-10-CM | POA: Insufficient documentation

## 2021-03-13 DIAGNOSIS — N186 End stage renal disease: Secondary | ICD-10-CM | POA: Diagnosis not present

## 2021-03-13 DIAGNOSIS — Z79899 Other long term (current) drug therapy: Secondary | ICD-10-CM | POA: Diagnosis not present

## 2021-03-13 LAB — COMPREHENSIVE METABOLIC PANEL
ALT: 17 U/L (ref 0–44)
AST: 17 U/L (ref 15–41)
Albumin: 3.6 g/dL (ref 3.5–5.0)
Alkaline Phosphatase: 69 U/L (ref 38–126)
Anion gap: 11 (ref 5–15)
BUN: 23 mg/dL (ref 8–23)
CO2: 30 mmol/L (ref 22–32)
Calcium: 8.4 mg/dL — ABNORMAL LOW (ref 8.9–10.3)
Chloride: 95 mmol/L — ABNORMAL LOW (ref 98–111)
Creatinine, Ser: 3.05 mg/dL — ABNORMAL HIGH (ref 0.44–1.00)
GFR, Estimated: 16 mL/min — ABNORMAL LOW (ref >60)
Glucose, Bld: 80 mg/dL (ref 70–99)
Potassium: 3.3 mmol/L — ABNORMAL LOW (ref 3.5–5.1)
Sodium: 136 mmol/L (ref 135–145)
Total Bilirubin: 1.1 mg/dL (ref 0.3–1.2)
Total Protein: 8.4 g/dL — ABNORMAL HIGH (ref 6.5–8.1)

## 2021-03-13 LAB — CBC WITH DIFFERENTIAL/PLATELET
Abs Immature Granulocytes: 0.01 10*3/uL (ref 0.00–0.07)
Basophils Absolute: 0 10*3/uL (ref 0.0–0.1)
Basophils Relative: 0 %
Eosinophils Absolute: 0.2 10*3/uL (ref 0.0–0.5)
Eosinophils Relative: 4 %
HCT: 31.4 % — ABNORMAL LOW (ref 36.0–46.0)
Hemoglobin: 9.9 g/dL — ABNORMAL LOW (ref 12.0–15.0)
Immature Granulocytes: 0 %
Lymphocytes Relative: 21 %
Lymphs Abs: 1.1 10*3/uL (ref 0.7–4.0)
MCH: 31.5 pg (ref 26.0–34.0)
MCHC: 31.5 g/dL (ref 30.0–36.0)
MCV: 100 fL (ref 80.0–100.0)
Monocytes Absolute: 0.6 10*3/uL (ref 0.1–1.0)
Monocytes Relative: 12 %
Neutro Abs: 3.2 10*3/uL (ref 1.7–7.7)
Neutrophils Relative %: 63 %
Platelets: 163 10*3/uL (ref 150–400)
RBC: 3.14 MIL/uL — ABNORMAL LOW (ref 3.87–5.11)
RDW: 15.4 % (ref 11.5–15.5)
WBC: 5.2 10*3/uL (ref 4.0–10.5)
nRBC: 0 % (ref 0.0–0.2)

## 2021-03-13 MED ORDER — VANCOMYCIN HCL 1250 MG/250ML IV SOLN
1250.0000 mg | Freq: Once | INTRAVENOUS | Status: AC
  Administered 2021-03-13: 1250 mg via INTRAVENOUS
  Filled 2021-03-13: qty 250

## 2021-03-13 MED ORDER — VANCOMYCIN HCL IN DEXTROSE 1-5 GM/200ML-% IV SOLN
1000.0000 mg | Freq: Once | INTRAVENOUS | Status: AC
  Administered 2021-03-13: 1000 mg via INTRAVENOUS
  Filled 2021-03-13: qty 200

## 2021-03-13 MED ORDER — VANCOMYCIN HCL 10 G IV SOLR
2250.0000 mg | Freq: Once | INTRAVENOUS | Status: DC
  Filled 2021-03-13: qty 2250

## 2021-03-13 NOTE — Consult Note (Signed)
PHARMACY -  BRIEF ANTIBIOTIC NOTE   Pharmacy has received consult(s) for Vancomycin from an ED provider.  The patient's profile has been reviewed for ht/wt/allergies/indication/available labs.    One time order(s) placed for  Vancomycin 2250 mg x 1   Further antibiotics/pharmacy consults should be ordered by admitting physician if indicated.                       Thank you, Dorothe Pea, PharmD, BCPS Clinical Pharmacist   03/13/2021  8:14 PM

## 2021-03-13 NOTE — Discharge Instructions (Signed)
Your plan is for antibiotic (vancomycin) treatment which will be given at each of your dialysis sessions.  Dr. Candiss Norse of your nephrology team is aware of this plan.  Please return to the ER right away if you develop fever, weakness, pain or cold or blue hand, weeping or drainage from your scab on the right hand, or other new concerns or symptoms arise.

## 2021-03-13 NOTE — ED Triage Notes (Signed)
Pts hand has a scab on her right 5th digit. It is red, swollen and warm to the touch. She states that pain and symptoms have gotten worse over the last 2 days.
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
## 2021-03-13 NOTE — ED Triage Notes (Signed)
First Nurse Note: Pt brought in by [facility] from dialysis with c/o right hand swelling. She was able to finish her dialysis.
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
## 2021-03-14 NOTE — ED Notes (Signed)
E signature pad not working. Pt educated on discharge instructions and verbalized understanding.  

## 2021-03-14 NOTE — ED Notes (Signed)
Request made for [facility] to transport to home

## 2021-03-16 ENCOUNTER — Ambulatory Visit: Payer: Medicare Other | Admitting: Nurse Practitioner

## 2021-03-16 ENCOUNTER — Other Ambulatory Visit: Payer: Medicare Other

## 2021-03-16 ENCOUNTER — Ambulatory Visit: Payer: Medicare Other

## 2021-03-18 LAB — CULTURE, BLOOD (SINGLE)
Culture: NO GROWTH
Special Requests: ADEQUATE

## 2021-03-19 ENCOUNTER — Inpatient Hospital Stay: Payer: Medicare Other

## 2021-03-19 ENCOUNTER — Inpatient Hospital Stay: Payer: Medicare Other | Admitting: Oncology

## 2021-03-29 ENCOUNTER — Ambulatory Visit: Payer: Medicare Other | Admitting: Physician Assistant

## 2021-04-24 ENCOUNTER — Encounter: Payer: Self-pay | Admitting: Emergency Medicine

## 2021-04-24 ENCOUNTER — Emergency Department: Payer: Medicare Other

## 2021-04-24 ENCOUNTER — Other Ambulatory Visit: Payer: Self-pay

## 2021-04-24 ENCOUNTER — Emergency Department
Admission: EM | Admit: 2021-04-24 | Discharge: 2021-04-25 | Disposition: A | Discharge status: Home / Self Care | Payer: Medicare Other | Attending: Emergency Medicine | Admitting: Emergency Medicine

## 2021-04-24 DIAGNOSIS — Z79899 Other long term (current) drug therapy: Secondary | ICD-10-CM | POA: Insufficient documentation

## 2021-04-24 DIAGNOSIS — N186 End stage renal disease: Secondary | ICD-10-CM | POA: Diagnosis not present

## 2021-04-24 DIAGNOSIS — R0602 Shortness of breath: Secondary | ICD-10-CM | POA: Diagnosis present

## 2021-04-24 DIAGNOSIS — I5043 Acute on chronic combined systolic (congestive) and diastolic (congestive) heart failure: Secondary | ICD-10-CM | POA: Diagnosis not present

## 2021-04-24 DIAGNOSIS — I132 Hypertensive heart and chronic kidney disease with heart failure and with stage 5 chronic kidney disease, or end stage renal disease: Secondary | ICD-10-CM | POA: Diagnosis not present

## 2021-04-24 DIAGNOSIS — Z87891 Personal history of nicotine dependence: Secondary | ICD-10-CM | POA: Insufficient documentation

## 2021-04-24 DIAGNOSIS — J441 Chronic obstructive pulmonary disease with (acute) exacerbation: Secondary | ICD-10-CM | POA: Diagnosis not present

## 2021-04-24 DIAGNOSIS — Z992 Dependence on renal dialysis: Secondary | ICD-10-CM | POA: Diagnosis not present

## 2021-04-24 LAB — CBC WITH DIFFERENTIAL/PLATELET
Abs Immature Granulocytes: 0.02 10*3/uL (ref 0.00–0.07)
Basophils Absolute: 0 10*3/uL (ref 0.0–0.1)
Basophils Relative: 0 %
Eosinophils Absolute: 0.2 10*3/uL (ref 0.0–0.5)
Eosinophils Relative: 4 %
HCT: 33.2 % — ABNORMAL LOW (ref 36.0–46.0)
Hemoglobin: 10.3 g/dL — ABNORMAL LOW (ref 12.0–15.0)
Immature Granulocytes: 0 %
Lymphocytes Relative: 12 %
Lymphs Abs: 0.7 10*3/uL (ref 0.7–4.0)
MCH: 32 pg (ref 26.0–34.0)
MCHC: 31 g/dL (ref 30.0–36.0)
MCV: 103.1 fL — ABNORMAL HIGH (ref 80.0–100.0)
Monocytes Absolute: 0.6 10*3/uL (ref 0.1–1.0)
Monocytes Relative: 12 %
Neutro Abs: 3.7 10*3/uL (ref 1.7–7.7)
Neutrophils Relative %: 72 %
Platelets: 168 10*3/uL (ref 150–400)
RBC: 3.22 MIL/uL — ABNORMAL LOW (ref 3.87–5.11)
RDW: 15.8 % — ABNORMAL HIGH (ref 11.5–15.5)
WBC: 5.3 10*3/uL (ref 4.0–10.5)
nRBC: 0 % (ref 0.0–0.2)

## 2021-04-24 LAB — COMPREHENSIVE METABOLIC PANEL
ALT: 12 U/L (ref 0–44)
AST: 15 U/L (ref 15–41)
Albumin: 3.8 g/dL (ref 3.5–5.0)
Alkaline Phosphatase: 62 U/L (ref 38–126)
Anion gap: 10 (ref 5–15)
BUN: 23 mg/dL (ref 8–23)
CO2: 32 mmol/L (ref 22–32)
Calcium: 9.6 mg/dL (ref 8.9–10.3)
Chloride: 93 mmol/L — ABNORMAL LOW (ref 98–111)
Creatinine, Ser: 3.61 mg/dL — ABNORMAL HIGH (ref 0.44–1.00)
GFR, Estimated: 13 mL/min — ABNORMAL LOW (ref >60)
Glucose, Bld: 105 mg/dL — ABNORMAL HIGH (ref 70–99)
Potassium: 3.3 mmol/L — ABNORMAL LOW (ref 3.5–5.1)
Sodium: 135 mmol/L (ref 135–145)
Total Bilirubin: 0.8 mg/dL (ref 0.3–1.2)
Total Protein: 8.7 g/dL — ABNORMAL HIGH (ref 6.5–8.1)

## 2021-04-24 LAB — TROPONIN I (HIGH SENSITIVITY)
Troponin I (High Sensitivity): 25 ng/L — ABNORMAL HIGH (ref <18)
Troponin I (High Sensitivity): 25 ng/L — ABNORMAL HIGH (ref <18)

## 2021-04-24 LAB — BRAIN NATRIURETIC PEPTIDE: B Natriuretic Peptide: 1371.7 pg/mL — ABNORMAL HIGH (ref 0.0–100.0)

## 2021-04-24 MED ORDER — DOXYCYCLINE HYCLATE 100 MG PO CAPS: 100.0000 mg | ORAL_CAPSULE | Freq: Two times a day (BID) | ORAL | 0 refills | Status: AC

## 2021-04-24 MED ORDER — IPRATROPIUM-ALBUTEROL 0.5-2.5 (3) MG/3ML IN SOLN
3.0000 mL | Freq: Once | RESPIRATORY_TRACT | Status: AC
  Administered 2021-04-24: 3 mL via RESPIRATORY_TRACT
  Filled 2021-04-24: qty 3

## 2021-04-24 MED ORDER — GUAIFENESIN ER 600 MG PO TB12: 600.0000 mg | ORAL_TABLET | Freq: Two times a day (BID) | ORAL | 0 refills | Status: AC

## 2021-04-24 MED ORDER — PREDNISONE 20 MG PO TABS: 40.0000 mg | ORAL_TABLET | Freq: Every day | ORAL | 0 refills | Status: AC

## 2021-04-24 MED ORDER — DOXYCYCLINE HYCLATE 100 MG PO TABS
100.0000 mg | ORAL_TABLET | Freq: Once | ORAL | Status: AC
  Administered 2021-04-24: 100 mg via ORAL
  Filled 2021-04-24: qty 1

## 2021-04-24 MED ORDER — PREDNISONE 20 MG PO TABS
40.0000 mg | ORAL_TABLET | ORAL | Status: AC
  Administered 2021-04-24: 40 mg via ORAL
  Filled 2021-04-24: qty 2

## 2021-04-24 NOTE — ED Triage Notes (Signed)
First nurse note: pt comes ems from home with productive cough and mild SOB for 2 days. Normally on 4LNC. Hx renal failure and CHF. Diminished lung sounds lower lobes. Aox4.

## 2021-04-24 NOTE — ED Provider Notes (Signed)
[facility] Emergency Department Provider Note  ____________________________________________  Time seen: Approximately 9:57 PM  I have reviewed the triage vital signs and the nursing notes.   HISTORY  Chief Complaint Shortness of Breath    HPI Cassandra Joseph is a 75 y.o. female with a past history of CHF, ESRD on hemodialysis, hypertension, COPD on 3 L nasal cannula who comes ED complaining of worsening shortness of breath with exertion for the past 2 days.  Also has increased productive cough.  No fevers or chills.  Currently she feels back to baseline.  Denies any chest pain.  No worsening swelling.  She has been compliant with dialysis.  No palpitations or syncope.  Symptoms are intermittent, no alleviating factors.    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 on HD x 11 years; b. felt to be 2/2 HTN; c. HD TTS   History of nuclear stress test    a. 08/2015: probably normal myocardial perfusion study, no evidence for significant ischemia or scar was noted, during stress, global systolic function normal, EF 54%, RV borderline dilatation, coronary artery calcifications and marked mitral annular calcifications were noted, liver calcifications were noted on attenuation CT scan likely represent sequelae of previous granulomatous disease   HLD (hyperlipidemia)    Hypertension    Ischemic stroke ([place])    a. 04/4495   Metabolic syndrome    Morbid obesity ([place])    Multiple myeloma ([place])    a. in remission   Osteomyelitis ([place])    a. s/p left 5th toe amputation 08/2015   Rheumatoid arthritis ([place])    TIA (transient ischemic attack)    a. 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[place])  09/27/2020   Left leg pain    Viral pneumonia 08/08/2020   Pneumonia due to COVID-19 virus 08/07/2020   History of anemia due to chronic kidney disease    ESRD (end stage renal disease) ([facility])    SOB (shortness of breath)    Acute pulmonary edema ([place]) 06/23/2019   Acute on chronic diastolic CHF (congestive heart failure) ([place]) 01/18/2017   HLD (hyperlipidemia) 01/18/2017   Atypical chest pain 11/09/2016   ESRD on hemodialysis ([place]) 11/09/2016   Morbid obesity ([place]) 11/09/2016   Essential hypertension 11/09/2016   Multiple myeloma ([place]) 11/09/2016   Elevated troponin 75/91/6384   Metabolic syndrome 66/59/9357     Past Surgical History:  Procedure Laterality Date   ABDOMINAL HYSTERECTOMY     APPENDECTOMY     eyelid surgery     HERNIA REPAIR     right knee surgery       Prior to Admission medications   Medication Sig Start Date End Date Taking? Authorizing Provider  doxycycline (VIBRAMYCIN) 100 MG capsule Take 1 capsule (100 mg total) by mouth 2 (two) times daily for 10 days. 04/24/21 05/04/21 Yes Carrie Mew, MD  guaiFENesin (MUCINEX) 600 MG 12 hr tablet Take 1 tablet (600 mg total) by mouth 2 (two) times daily for 15 days. 04/24/21 05/09/21 Yes Carrie Mew, MD  predniSONE (DELTASONE) 20 MG tablet Take 2 tablets (40 mg total) by mouth daily with breakfast for 4 days. 04/24/21 04/28/21  Yes Carrie Mew, MD  amLODipine (NORVASC) 10 MG tablet Take 1 tablet by mouth daily. 11/27/20 11/27/21  [provider]  apixaban (ELIQUIS) 2.5 MG TABS tablet Take 1 tablet (2.5 mg total) by mouth 2 (two) times daily. 06/28/19   Lavina Hamman, MD  atorvastatin (LIPITOR) 80 MG tablet Take 1 tablet (80 mg total) by mouth daily. 06/29/19   Lavina Hamman, MD  b complex-vitamin c-folic acid (NEPHRO-VITE) 0.8 MG TABS tablet Take 1 tablet by mouth daily. 09/27/16   [provider]  bisacodyl (DULCOLAX) 10 MG suppository Place 1 suppository (10 mg total) rectally daily as  needed for moderate constipation. 10/05/20   Cherene Altes, MD  carvedilol (COREG) 12.5 MG tablet Take 1 tablet (12.5 mg total) by mouth 2 (two) times daily with a meal. 10/05/20   Cherene Altes, MD  cinacalcet (SENSIPAR) 60 MG tablet Take 60 mg by mouth 2 (two) times daily.    [provider]  epoetin alfa (EPOGEN) 4000 UNIT/ML injection Inject 1 mL (4,000 Units total) into the vein Every Tuesday,Thursday,and Saturday with dialysis. 10/05/20   Cherene Altes, MD  gabapentin (NEURONTIN) 300 MG capsule Take 300 mg by mouth 3 (three) times daily. 07/27/20   [provider]  hydrocortisone cream 1 % Apply 1 application topically 2 (two) times daily. 11/24/20   Blake Divine, MD  Hydrocortisone, Perianal, 1 % CREA Apply topically. 11/24/20   [provider]  hydroxychloroquine (PLAQUENIL) 200 MG tablet Take 200 mg by mouth daily. 06/30/20   [provider]  midodrine (PROAMATINE) 10 MG tablet Take 1 tablet (10 mg total) by mouth 3 (three) times daily with meals. 06/28/19   Lavina Hamman, MD  polyethylene glycol (MIRALAX / GLYCOLAX) 17 g packet Take 17 g by mouth 2 (two) times daily. 10/05/20   Cherene Altes, MD  senna (SENOKOT) 8.6 MG tablet Take 2 tablets by mouth at bedtime. 11/27/20   [provider]  sevelamer carbonate (RENVELA) 800 MG tablet Take 800 mg by mouth 4 (four) times daily.    [provider]     Allergies Shellfish allergy   Family History  Problem Relation Age of Onset   Seizures Mother    Hypertension Mother    Throat cancer Father    Breast cancer Neg Hx     Social History Social History   Tobacco Use   Smoking status: Former   Smokeless tobacco: Never   Tobacco comments:    quit in 2007  Substance Use Topics   Alcohol use: No   Drug use: No    Review of Systems  Constitutional:   No fever or chills.  ENT:   No sore throat. No rhinorrhea. Cardiovascular:   No chest pain or  syncope. Respiratory:   Positive shortness of breath and productive cough. Gastrointestinal:   Negative for abdominal pain, vomiting and diarrhea.  Musculoskeletal:   Negative for focal pain or swelling All other systems reviewed and are negative except as documented above in ROS and HPI.  ____________________________________________   PHYSICAL EXAM:  VITAL SIGNS: ED Triage Vitals  Enc Vitals Group     BP 04/24/21 1706 95/67     Pulse Rate 04/24/21 1706 81     Resp 04/24/21 1706 20     Temp 04/24/21 1706 97.8 F (36.6 C)     Temp Source 04/24/21 1706 Oral     SpO2 04/24/21 1706 100 %     Weight 04/24/21 1706  230 lb (104.3 kg)     Height 04/24/21 1706 5' 5"  (1.651 m)     Head Circumference --      Peak Flow --      Pain Score 04/24/21 1757 0     Pain Loc --      Pain Edu? --      Excl. in [place]? --     Vital signs reviewed, nursing assessments reviewed.   Constitutional:   Alert and oriented. Non-toxic appearance. Eyes:   Conjunctivae are normal. EOMI. PERRL. ENT      Head:   Normocephalic and atraumatic.      Nose:   Wearing a mask.      Mouth/Throat:   Wearing a mask.      Neck:   No meningismus. Full ROM. Hematological/Lymphatic/Immunilogical:   No cervical lymphadenopathy. Cardiovascular:   RRR.  Strong thrill in right upper extremity AV fistula.  No murmurs. Cap refill less than 2 seconds. Respiratory:   Normal respiratory effort without tachypnea/retractions.  Diffuse expiratory wheezes and prolonged expiratory phase.  Symmetric air movement.  No focal crackles. Gastrointestinal:   Soft and nontender. Non distended. There is no CVA tenderness.  No rebound, rigidity, or guarding. Genitourinary:   deferred Musculoskeletal:   Normal range of motion in all extremities. No joint effusions.  No lower extremity tenderness.  No edema. Neurologic:   Normal speech and language.  Motor grossly intact. No acute focal neurologic deficits are appreciated.  Skin:    Skin is  warm, dry and intact. No rash noted.  No petechiae, purpura, or bullae.  ____________________________________________    LABS (pertinent positives/negatives) (all labs ordered are listed, but only abnormal results are displayed) Labs Reviewed  COMPREHENSIVE METABOLIC PANEL - Abnormal; Notable for the following components:      Result Value   Potassium 3.3 (*)    Chloride 93 (*)    Glucose, Bld 105 (*)    Creatinine, Ser 3.61 (*)    Total Protein 8.7 (*)    GFR, Estimated 13 (*)    All other components within normal limits  CBC WITH DIFFERENTIAL/PLATELET - Abnormal; Notable for the following components:   RBC 3.22 (*)    Hemoglobin 10.3 (*)    HCT 33.2 (*)    MCV 103.1 (*)    RDW 15.8 (*)    All other components within normal limits  BRAIN NATRIURETIC PEPTIDE - Abnormal; Notable for the following components:   B Natriuretic Peptide 1,371.7 (*)    All other components within normal limits  TROPONIN I (HIGH SENSITIVITY) - Abnormal; Notable for the following components:   Troponin I (High Sensitivity) 25 (*)    All other components within normal limits  TROPONIN I (HIGH SENSITIVITY) - Abnormal; Notable for the following components:   Troponin I (High Sensitivity) 25 (*)    All other components within normal limits   ____________________________________________   EKG  Interpreted by me Atrial fibrillation, rate of 81.  Leftward axis.  Normal QRS, normal ST segments and T waves.  No acute ischemic changes.  ____________________________________________    RADIOLOGY  DG Chest 2 View  Result Date: 04/24/2021 CLINICAL DATA:  Shortness of breath and cough. EXAM: CHEST - 2 VIEW COMPARISON:  08/07/2020 and prior chest radiographs FINDINGS: Cardiomegaly and mild pulmonary vascular congestion noted. Bilateral LOWER lung opacities/atelectasis/scarring are slightly decreased from 08/07/2020. There may be a small RIGHT pleural effusion present. Elevated RIGHT hemidiaphragm again noted.  No pneumothorax is identified. IMPRESSION: 1. Cardiomegaly  with mild pulmonary vascular congestion. 2. Bilateral LOWER lung opacities/atelectasis/scarring, slightly decreased from 08/07/2020. Possible small RIGHT pleural effusion. Electronically Signed   By: Margarette Canada M.D.   On: 04/24/2021 18:56    ____________________________________________   PROCEDURES Procedures  ____________________________________________  DIFFERENTIAL DIAGNOSIS   Pneumonia, pleural effusion, pulmonary edema, COPD exacerbation  CLINICAL IMPRESSION / ASSESSMENT AND PLAN / ED COURSE  Medications ordered in the ED: Medications  predniSONE (DELTASONE) tablet 40 mg (40 mg Oral Given 04/24/21 2053)  ipratropium-albuterol (DUONEB) 0.5-2.5 (3) MG/3ML nebulizer solution 3 mL (3 mLs Nebulization Given 04/24/21 2053)  doxycycline (VIBRA-TABS) tablet 100 mg (100 mg Oral Given 04/24/21 2053)    Pertinent labs & imaging results that were available during my care of the patient were reviewed by me and considered in my medical decision making (see chart for details).  Cassandra Joseph was evaluated in Emergency Department on 04/24/2021 for the symptoms described in the history of present illness. She was evaluated in the context of the global COVID-19 pandemic, which necessitated consideration that the patient might be at risk for infection with the SARS-CoV-2 virus that causes COVID-19. Institutional protocols and algorithms that pertain to the evaluation of patients at risk for COVID-19 are in a state of rapid change based on information released by regulatory bodies including the CDC and federal and state organizations. These policies and algorithms were followed during the patient's care in the ED.     Clinical Course as of 04/24/21 2157  Sat Apr 24, 2021  2029 Presents with expiratory wheezing, increased cough consistent with COPD exacerbation.  Chest x-ray appears stable.  Labs stable with baseline renal failure and chronic  CHF.  Vital signs are unremarkable.  She reports she feels back to baseline now and her vitals are stable on her usual 3 L nasal cannula.  If symptomatically improving with bronchodilators and steroids, I think she can be discharged home. [PS]    Clinical Course User Index [PS] Carrie Mew, MD     ----------------------------------------- 10:00 PM on 04/24/2021 ----------------------------------------- No significant chest pain, vital signs unremarkable.  Doubt ACS PE dissection AAA pericardial effusion, myocarditis.  No lobar pneumonia, not septic.  Stable for outpatient management.  ____________________________________________   FINAL CLINICAL IMPRESSION(S) / ED DIAGNOSES    Final diagnoses:  COPD exacerbation [facility])     ED Discharge Orders          Ordered    doxycycline (VIBRAMYCIN) 100 MG capsule  2 times daily        04/24/21 2157    predniSONE (DELTASONE) 20 MG tablet  Daily with breakfast        04/24/21 2157    guaiFENesin (MUCINEX) 600 MG 12 hr tablet  2 times daily        04/24/21 2157            Portions of this note were generated with dragon dictation software. Dictation errors may occur despite best attempts at proofreading.    Carrie Mew, MD 04/24/21 2200

## 2021-04-24 NOTE — ED Provider Notes (Signed)
Emergency Medicine Provider Triage Evaluation Note  Cassandra Joseph , a 75 y.o. female  was evaluated in triage.  Pt complains of SOB. She presents vial EMS from home with c/o productive cough and mild SOB for the last 2 days. She denies fevers, chills, or vomiting. She is no chronic O2 therapy at home and on dialysis for ESRD every TThSa. She had dialysis today, and called out after returning home.  Review of Systems  Positive: SOB, productive cough Negative: FCS  Physical Exam  BP 95/67 (BP Location: Left Arm)   Pulse 81   Temp 97.8 F (36.6 C) (Oral)   Resp 20   Ht 5\' 5"  (1.651 m)   Wt 104.3 kg   SpO2 100%   BMI 38.27 kg/m  Gen:   Awake, no distress  NAD Resp:  Normal effort CTA. Intermittent cough MSK:   Moves extremities without difficulty. No LE edema Other:    Medical Decision Making  Medically screening exam initiated at 5:45 PM.  Appropriate orders placed.  [name] was informed that the remainder of the evaluation will be completed by another provider, this initial triage assessment does not replace that evaluation, and the importance of remaining in the ED until their evaluation is complete.  Patient with CHF, ESRD on dialysis the ED evaluation of cough and SOB.    Melvenia Needles, PA-C 04/24/21 1754    Vladimir Crofts, MD 04/24/21 [id number]

## 2021-04-24 NOTE — ED Triage Notes (Signed)
Pt via EMS from home. Pt c/o productive cough and SOB for the past 3 days. Pt is a dialysis pt and has not missed any appt. Pt is on 3L [place] chronically. Denies pain. Pt is A&Ox4 and NAD.

## 2021-04-27 ENCOUNTER — Other Ambulatory Visit: Payer: Self-pay

## 2021-04-27 ENCOUNTER — Emergency Department: Payer: Medicare Other

## 2021-04-27 DIAGNOSIS — Z992 Dependence on renal dialysis: Secondary | ICD-10-CM

## 2021-04-27 DIAGNOSIS — Z7901 Long term (current) use of anticoagulants: Secondary | ICD-10-CM

## 2021-04-27 DIAGNOSIS — N2581 Secondary hyperparathyroidism of renal origin: Secondary | ICD-10-CM | POA: Diagnosis present

## 2021-04-27 DIAGNOSIS — N186 End stage renal disease: Secondary | ICD-10-CM | POA: Diagnosis present

## 2021-04-27 DIAGNOSIS — Z79899 Other long term (current) drug therapy: Secondary | ICD-10-CM

## 2021-04-27 DIAGNOSIS — I480 Paroxysmal atrial fibrillation: Secondary | ICD-10-CM | POA: Diagnosis present

## 2021-04-27 DIAGNOSIS — C9002 Multiple myeloma in relapse: Secondary | ICD-10-CM | POA: Diagnosis present

## 2021-04-27 DIAGNOSIS — R34 Anuria and oliguria: Secondary | ICD-10-CM | POA: Diagnosis present

## 2021-04-27 DIAGNOSIS — Z9981 Dependence on supplemental oxygen: Secondary | ICD-10-CM

## 2021-04-27 DIAGNOSIS — Z20822 Contact with and (suspected) exposure to covid-19: Secondary | ICD-10-CM | POA: Diagnosis present

## 2021-04-27 DIAGNOSIS — M069 Rheumatoid arthritis, unspecified: Secondary | ICD-10-CM | POA: Diagnosis present

## 2021-04-27 DIAGNOSIS — I120 Hypertensive chronic kidney disease with stage 5 chronic kidney disease or end stage renal disease: Secondary | ICD-10-CM | POA: Diagnosis present

## 2021-04-27 DIAGNOSIS — J189 Pneumonia, unspecified organism: Principal | ICD-10-CM | POA: Diagnosis present

## 2021-04-27 DIAGNOSIS — D631 Anemia in chronic kidney disease: Secondary | ICD-10-CM | POA: Diagnosis present

## 2021-04-27 DIAGNOSIS — K5900 Constipation, unspecified: Secondary | ICD-10-CM | POA: Diagnosis present

## 2021-04-27 DIAGNOSIS — G4733 Obstructive sleep apnea (adult) (pediatric): Secondary | ICD-10-CM | POA: Diagnosis present

## 2021-04-27 DIAGNOSIS — Z6841 Body Mass Index (BMI) 40.0 and over, adult: Secondary | ICD-10-CM

## 2021-04-27 DIAGNOSIS — R0602 Shortness of breath: Secondary | ICD-10-CM | POA: Diagnosis not present

## 2021-04-27 DIAGNOSIS — J9611 Chronic respiratory failure with hypoxia: Secondary | ICD-10-CM | POA: Diagnosis present

## 2021-04-27 LAB — CBC
HCT: 30.9 % — ABNORMAL LOW (ref 36.0–46.0)
Hemoglobin: 9.7 g/dL — ABNORMAL LOW (ref 12.0–15.0)
MCH: 31.7 pg (ref 26.0–34.0)
MCHC: 31.4 g/dL (ref 30.0–36.0)
MCV: 101 fL — ABNORMAL HIGH (ref 80.0–100.0)
Platelets: 160 10*3/uL (ref 150–400)
RBC: 3.06 MIL/uL — ABNORMAL LOW (ref 3.87–5.11)
RDW: 15.2 % (ref 11.5–15.5)
WBC: 4.7 10*3/uL (ref 4.0–10.5)
nRBC: 0 % (ref 0.0–0.2)

## 2021-04-27 LAB — BASIC METABOLIC PANEL
Anion gap: 11 (ref 5–15)
BUN: 28 mg/dL — ABNORMAL HIGH (ref 8–23)
CO2: 29 mmol/L (ref 22–32)
Calcium: 9.1 mg/dL (ref 8.9–10.3)
Chloride: 96 mmol/L — ABNORMAL LOW (ref 98–111)
Creatinine, Ser: 3.93 mg/dL — ABNORMAL HIGH (ref 0.44–1.00)
GFR, Estimated: 11 mL/min — ABNORMAL LOW (ref >60)
Glucose, Bld: 115 mg/dL — ABNORMAL HIGH (ref 70–99)
Potassium: 3.5 mmol/L (ref 3.5–5.1)
Sodium: 136 mmol/L (ref 135–145)

## 2021-04-27 NOTE — ED Notes (Signed)
Oxygen tank replaced °

## 2021-04-27 NOTE — ED Provider Notes (Signed)
Emergency Medicine Provider Triage Evaluation Note  Cassandra Joseph , a 75 y.o. female  was evaluated in triage.  Pt complains of intermittent SOB. Presents via EMS from dialysis.  Cording to report via EMS, her sats were in the 80s while at dialysis.  She was evaluated a few days ago for the same complaint, and discharged with antibiotics.  She wears 3 L oxygen at home, and presents with 94% O2 sats on 3 L.  Review of Systems  Positive: SOB Negative: FCS, CP  Physical Exam  BP 138/67   Pulse 85   Temp 98.3 F (36.8 C) (Oral)   Resp (!) 22   Ht 5\' 6"  (1.676 m)   SpO2 94%  Gen:   Awake, no distress  NAD Resp:  Normal effort CTA MSK:   Moves extremities without difficulty  Other:  CVS: RRR  Medical Decision Making  Medically screening exam initiated at 7:03 PM.  Appropriate orders placed.  Cassandra Joseph was informed that the remainder of the evaluation will be completed by another provider, this initial triage assessment does not replace that evaluation, and the importance of remaining in the ED until their evaluation is complete.  Patient with ED evaluation of shortness of breath.   Melvenia Needles, PA-C 04/27/21 1907    Vanessa [place], MD 04/27/21 2226

## 2021-04-27 NOTE — ED Triage Notes (Signed)
Pt to ED [facility] from [facility], received full dialysis. Reports here for shob that started a few days ago. States on anitbiotics for pneumonia. Wears 3 L [place] at home  Pt 94% on 3 l [place]. Speaking in complete sentences

## 2021-04-27 NOTE — ED Triage Notes (Signed)
First Nurse Note:  Arrives via [facility] from Dialysis.  Per report, sats dropped into the 80;s. Wears 3l/ [place] home oxygen.  Placed on 4l/ [place] -- sats 95%.

## 2021-04-28 ENCOUNTER — Inpatient Hospital Stay (HOSPITAL_COMMUNITY)
Admit: 2021-04-28 | Discharge: 2021-04-28 | Disposition: A | Discharge status: Home / Self Care | Payer: Medicare Other | Attending: Internal Medicine | Admitting: Internal Medicine

## 2021-04-28 ENCOUNTER — Emergency Department: Payer: Medicare Other

## 2021-04-28 ENCOUNTER — Inpatient Hospital Stay
Admission: EM | Admit: 2021-04-28 | Discharge: 2021-05-01 | DRG: 193 | Disposition: A | Discharge status: Home Health | Payer: Medicare Other | Source: Ambulatory Visit | Attending: Internal Medicine | Admitting: Internal Medicine

## 2021-04-28 DIAGNOSIS — Z79899 Other long term (current) drug therapy: Secondary | ICD-10-CM | POA: Diagnosis not present

## 2021-04-28 DIAGNOSIS — J189 Pneumonia, unspecified organism: Secondary | ICD-10-CM | POA: Diagnosis present

## 2021-04-28 DIAGNOSIS — R34 Anuria and oliguria: Secondary | ICD-10-CM | POA: Diagnosis present

## 2021-04-28 DIAGNOSIS — I480 Paroxysmal atrial fibrillation: Secondary | ICD-10-CM

## 2021-04-28 DIAGNOSIS — Z9981 Dependence on supplemental oxygen: Secondary | ICD-10-CM | POA: Diagnosis not present

## 2021-04-28 DIAGNOSIS — D631 Anemia in chronic kidney disease: Secondary | ICD-10-CM | POA: Diagnosis present

## 2021-04-28 DIAGNOSIS — G4733 Obstructive sleep apnea (adult) (pediatric): Secondary | ICD-10-CM

## 2021-04-28 DIAGNOSIS — K5900 Constipation, unspecified: Secondary | ICD-10-CM | POA: Diagnosis present

## 2021-04-28 DIAGNOSIS — I509 Heart failure, unspecified: Secondary | ICD-10-CM

## 2021-04-28 DIAGNOSIS — Z992 Dependence on renal dialysis: Secondary | ICD-10-CM

## 2021-04-28 DIAGNOSIS — C9002 Multiple myeloma in relapse: Secondary | ICD-10-CM

## 2021-04-28 DIAGNOSIS — Z6841 Body Mass Index (BMI) 40.0 and over, adult: Secondary | ICD-10-CM | POA: Diagnosis not present

## 2021-04-28 DIAGNOSIS — Z7901 Long term (current) use of anticoagulants: Secondary | ICD-10-CM

## 2021-04-28 DIAGNOSIS — I120 Hypertensive chronic kidney disease with stage 5 chronic kidney disease or end stage renal disease: Secondary | ICD-10-CM | POA: Diagnosis present

## 2021-04-28 DIAGNOSIS — N186 End stage renal disease: Secondary | ICD-10-CM

## 2021-04-28 DIAGNOSIS — Z20822 Contact with and (suspected) exposure to covid-19: Secondary | ICD-10-CM | POA: Diagnosis present

## 2021-04-28 DIAGNOSIS — R0600 Dyspnea, unspecified: Secondary | ICD-10-CM

## 2021-04-28 DIAGNOSIS — M069 Rheumatoid arthritis, unspecified: Secondary | ICD-10-CM | POA: Diagnosis present

## 2021-04-28 DIAGNOSIS — N2581 Secondary hyperparathyroidism of renal origin: Secondary | ICD-10-CM | POA: Diagnosis present

## 2021-04-28 DIAGNOSIS — R0602 Shortness of breath: Secondary | ICD-10-CM | POA: Diagnosis present

## 2021-04-28 DIAGNOSIS — J9611 Chronic respiratory failure with hypoxia: Secondary | ICD-10-CM | POA: Diagnosis present

## 2021-04-28 DIAGNOSIS — I5031 Acute diastolic (congestive) heart failure: Secondary | ICD-10-CM | POA: Diagnosis not present

## 2021-04-28 DIAGNOSIS — Z9989 Dependence on other enabling machines and devices: Secondary | ICD-10-CM

## 2021-04-28 HISTORY — DX: Disorder of kidney and ureter, unspecified: N28.9

## 2021-04-28 HISTORY — DX: Essential (primary) hypertension: I10

## 2021-04-28 HISTORY — DX: Heart failure, unspecified: I50.9

## 2021-04-28 LAB — BRAIN NATRIURETIC PEPTIDE: B Natriuretic Peptide: 1496.5 pg/mL — ABNORMAL HIGH (ref 0.0–100.0)

## 2021-04-28 LAB — MRSA NEXT GEN BY PCR, NASAL: MRSA by PCR Next Gen: NOT DETECTED

## 2021-04-28 LAB — ECHOCARDIOGRAM COMPLETE
AR max vel: 1.68 cm2
AV Area VTI: 1.95 cm2
AV Area mean vel: 1.83 cm2
AV Mean grad: 6 mmHg
AV Peak grad: 11.7 mmHg
Ao pk vel: 1.71 m/s
Area-P 1/2: 2.45 cm2
Height: 66 in
MV VTI: 1.51 cm2
S' Lateral: 2.5 cm
Weight: 4063.52 oz

## 2021-04-28 LAB — LACTIC ACID, PLASMA: Lactic Acid, Venous: 0.8 mmol/L (ref 0.5–1.9)

## 2021-04-28 LAB — RESP PANEL BY RT-PCR (FLU A&B, COVID) ARPGX2
Influenza A by PCR: NEGATIVE
Influenza B by PCR: NEGATIVE
SARS Coronavirus 2 by RT PCR: NEGATIVE

## 2021-04-28 LAB — PROTIME-INR
INR: 1.2 (ref 0.8–1.2)
Prothrombin Time: 15 seconds (ref 11.4–15.2)

## 2021-04-28 LAB — APTT: aPTT: 43 seconds — ABNORMAL HIGH (ref 24–36)

## 2021-04-28 LAB — PROCALCITONIN: Procalcitonin: 1.29 ng/mL

## 2021-04-28 MED ORDER — LACTULOSE 10 GM/15ML PO SOLN: 20.0000 g | Freq: Two times a day (BID) | ORAL | Status: DC | PRN

## 2021-04-28 MED ORDER — APIXABAN 2.5 MG PO TABS: 2.5000 mg | ORAL_TABLET | Freq: Two times a day (BID) | ORAL | Status: DC

## 2021-04-28 MED ORDER — SEVELAMER CARBONATE 800 MG PO TABS
1600.0000 mg | ORAL_TABLET | ORAL | Status: DC
  Filled 2021-04-28 (×2): qty 2

## 2021-04-28 MED ORDER — VANCOMYCIN VARIABLE DOSE PER UNSTABLE RENAL FUNCTION (PHARMACIST DOSING): Status: DC

## 2021-04-28 MED ORDER — VANCOMYCIN HCL 1500 MG/300ML IV SOLN
1500.0000 mg | Freq: Once | INTRAVENOUS | Status: AC
  Administered 2021-04-28: 1500 mg via INTRAVENOUS
  Filled 2021-04-28: qty 300

## 2021-04-28 MED ORDER — SODIUM CHLORIDE 0.9 % IV SOLN
500.0000 mg | INTRAVENOUS | Status: DC
  Administered 2021-04-28 – 2021-04-30 (×3): 500 mg via INTRAVENOUS
  Filled 2021-04-28 (×3): qty 500

## 2021-04-28 MED ORDER — FLUTICASONE PROPIONATE 50 MCG/ACT NA SUSP
1.0000 | Freq: Every day | NASAL | Status: DC
  Administered 2021-04-29: 2 via NASAL
  Administered 2021-04-30 – 2021-05-01 (×2): 1 via NASAL
  Filled 2021-04-28: qty 16

## 2021-04-28 MED ORDER — SEVELAMER CARBONATE 800 MG PO TABS: 1600.0000 mg | ORAL_TABLET | Freq: Three times a day (TID) | ORAL | Status: DC

## 2021-04-28 MED ORDER — VANCOMYCIN HCL IN DEXTROSE 1-5 GM/200ML-% IV SOLN
1000.0000 mg | Freq: Once | INTRAVENOUS | Status: AC
  Administered 2021-04-28: 1000 mg via INTRAVENOUS
  Filled 2021-04-28: qty 200

## 2021-04-28 MED ORDER — CHLORHEXIDINE GLUCONATE CLOTH 2 % EX PADS
6.0000 | MEDICATED_PAD | Freq: Every day | CUTANEOUS | Status: DC
  Administered 2021-04-29 – 2021-05-01 (×3): 6 via TOPICAL
  Filled 2021-04-28: qty 6

## 2021-04-28 MED ORDER — ONDANSETRON HCL 4 MG PO TABS: 4.0000 mg | ORAL_TABLET | Freq: Four times a day (QID) | ORAL | Status: DC | PRN

## 2021-04-28 MED ORDER — FUROSEMIDE 10 MG/ML IJ SOLN
20.0000 mg | Freq: Two times a day (BID) | INTRAMUSCULAR | Status: DC
  Filled 2021-04-28: qty 4

## 2021-04-28 MED ORDER — SEVELAMER CARBONATE 800 MG PO TABS
2400.0000 mg | ORAL_TABLET | Freq: Three times a day (TID) | ORAL | Status: DC
  Administered 2021-04-29 – 2021-05-01 (×6): 2400 mg via ORAL
  Filled 2021-04-28 (×6): qty 3

## 2021-04-28 MED ORDER — HYDROCODONE-ACETAMINOPHEN 5-325 MG PO TABS: 1.0000 | ORAL_TABLET | Freq: Two times a day (BID) | ORAL | Status: DC | PRN

## 2021-04-28 MED ORDER — SODIUM CHLORIDE 0.9 % IV SOLN
1.0000 g | Freq: Once | INTRAVENOUS | Status: DC
  Filled 2021-04-28: qty 1

## 2021-04-28 MED ORDER — CEFTRIAXONE SODIUM 2 G IJ SOLR
2.0000 g | INTRAMUSCULAR | Status: DC
  Administered 2021-04-28 – 2021-04-30 (×3): 2 g via INTRAVENOUS
  Filled 2021-04-28: qty 20
  Filled 2021-04-28: qty 2
  Filled 2021-04-28: qty 20

## 2021-04-28 MED ORDER — ACETAMINOPHEN 650 MG RE SUPP
650.0000 mg | Freq: Four times a day (QID) | RECTAL | Status: DC | PRN
  Filled 2021-04-28: qty 1

## 2021-04-28 MED ORDER — ONDANSETRON HCL 4 MG/2ML IJ SOLN: 4.0000 mg | Freq: Four times a day (QID) | INTRAMUSCULAR | Status: DC | PRN

## 2021-04-28 MED ORDER — ACETAMINOPHEN 325 MG PO TABS: 650.0000 mg | ORAL_TABLET | Freq: Four times a day (QID) | ORAL | Status: DC | PRN

## 2021-04-28 MED ORDER — GABAPENTIN 300 MG PO CAPS
300.0000 mg | ORAL_CAPSULE | Freq: Three times a day (TID) | ORAL | Status: DC
  Administered 2021-04-28 – 2021-05-01 (×8): 300 mg via ORAL
  Filled 2021-04-28 (×8): qty 1

## 2021-04-28 MED ORDER — HYDROXYCHLOROQUINE SULFATE 200 MG PO TABS
200.0000 mg | ORAL_TABLET | Freq: Every day | ORAL | Status: DC
  Administered 2021-04-28 – 2021-05-01 (×4): 200 mg via ORAL
  Filled 2021-04-28 (×4): qty 1

## 2021-04-28 MED ORDER — ATORVASTATIN CALCIUM 80 MG PO TABS
80.0000 mg | ORAL_TABLET | Freq: Every day | ORAL | Status: DC
  Administered 2021-04-28 – 2021-05-01 (×4): 80 mg via ORAL
  Filled 2021-04-28: qty 4
  Filled 2021-04-28 (×3): qty 1

## 2021-04-28 MED ORDER — APIXABAN 2.5 MG PO TABS
2.5000 mg | ORAL_TABLET | Freq: Two times a day (BID) | ORAL | Status: DC
  Administered 2021-04-28 – 2021-05-01 (×7): 2.5 mg via ORAL
  Filled 2021-04-28 (×7): qty 1

## 2021-04-28 NOTE — Progress Notes (Signed)
[facility]  ROUNDING NOTE   Subjective:   Cassandra Joseph is a 75 y.o. female with past medical conditions including hypertension, anemia, multiple myeloma, chronic respiratory failure on home O2, and end stage renal disease on dialysis. Patient presents to ED from outpatient dialysis clinic with complaints of shortness of breath. Patient has been admitted for CHF (congestive heart failure) ([place]) [I50.9]  Patient is known to our clinic from previous admission and receives outpatient dialysis at [facility], supervised by [facility] physicians. Patient reports a congested cough and progressive shortness of breath for the past week. Denies pain, fever and nausea/vomiting. She went to dialysis today and reports shortness of breath continued. She currently wears oxygen at home, 2-3L. We have been consulted to manage dialysis needs during admission  Chest xray shows mild to moderate interstitial edema with atelectasis. Chest CT shows multifocal pneumonia.    Objective:  Vital signs in last 24 hours:  Temp:  [98 F (36.7 C)-98.3 F (36.8 C)] 98 F (36.7 C) (09/21 0003) Pulse Rate:  [66-92] 66 (09/21 0900) Resp:  [13-24] 13 (09/21 0900) BP: (115-142)/(56-78) 127/58 (09/21 0900) SpO2:  [94 %-100 %] 96 % (09/21 0900) Weight:  [115.2 kg] 115.2 kg (09/21 0348)  Weight change:  Filed Weights   04/28/21 0348  Weight: 115.2 kg    Intake/Output: I/O last 3 completed shifts: In: 524.6 [IV Piggyback:524.6] Out: -    Intake/Output this shift:  No intake/output data recorded.  Physical Exam: General: NAD, resting in bed  Head: Normocephalic, atraumatic. Moist oral mucosal membranes  Eyes: Anicteric  Lungs:  Clear to auscultation, normal effort [place] O2 3L  Heart: Regular rate and rhythm  Abdomen:  Soft, nontender  Extremities:  no peripheral edema.  Neurologic: Nonfocal, moving all four extremities  Skin: No lesions  Access: Lt AVF    Basic Metabolic Panel: Recent Labs   Lab 04/27/21 1552  NA 136  K 3.5  CL 96*  CO2 29  GLUCOSE 115*  BUN 28*  CREATININE 3.93*  CALCIUM 9.1    Liver Function Tests: No results for input(s): AST, ALT, ALKPHOS, BILITOT, PROT, ALBUMIN in the last 168 hours. No results for input(s): LIPASE, AMYLASE in the last 168 hours. No results for input(s): AMMONIA in the last 168 hours.  CBC: Recent Labs  Lab 04/27/21 1552  WBC 4.7  HGB 9.7*  HCT 30.9*  MCV 101.0*  PLT 160    Cardiac Enzymes: No results for input(s): CKTOTAL, CKMB, CKMBINDEX, TROPONINI in the last 168 hours.  BNP: Invalid input(s): POCBNP  CBG: No results for input(s): GLUCAP in the last 168 hours.  Microbiology: Results for orders placed or performed during the hospital encounter of 04/28/21  Resp Panel by RT-PCR (Flu A&B, Covid) Nasopharyngeal Swab     Status: None   Collection Time: 04/28/21  1:25 AM   Specimen: Nasopharyngeal Swab; Nasopharyngeal(NP) swabs in vial transport medium  Result Value Ref Range Status   SARS Coronavirus 2 by RT PCR NEGATIVE NEGATIVE Final    Comment: (NOTE) SARS-CoV-2 target nucleic acids are NOT DETECTED.  The SARS-CoV-2 RNA is generally detectable in upper respiratory specimens during the acute phase of infection. The lowest concentration of SARS-CoV-2 viral copies this assay can detect is 138 copies/mL. A negative result does not preclude SARS-Cov-2 infection and should not be used as the sole basis for treatment or other patient management decisions. A negative result may occur with  improper specimen collection/handling, submission of specimen other than nasopharyngeal swab,  presence of viral mutation(s) within the areas targeted by this assay, and inadequate number of viral copies(<138 copies/mL). A negative result must be combined with clinical observations, patient history, and epidemiological information. The expected result is Negative.  Fact Sheet for Patients:   EntrepreneurPulse.com.au  Fact Sheet for Healthcare Providers:  IncredibleEmployment.be  This test is no t yet approved or cleared by the [place] FDA and  has been authorized for detection and/or diagnosis of SARS-CoV-2 by FDA under an Emergency Use Authorization (EUA). This EUA will remain  in effect (meaning this test can be used) for the duration of the COVID-19 declaration under Section 564(b)(1) of the Act, 21 U.S.C.section 360bbb-3(b)(1), unless the authorization is terminated  or revoked sooner.       Influenza A by PCR NEGATIVE NEGATIVE Final   Influenza B by PCR NEGATIVE NEGATIVE Final    Comment: (NOTE) The Xpert Xpress SARS-CoV-2/FLU/RSV plus assay is intended as an aid in the diagnosis of influenza from Nasopharyngeal swab specimens and should not be used as a sole basis for treatment. Nasal washings and aspirates are unacceptable for Xpert Xpress SARS-CoV-2/FLU/RSV testing.  Fact Sheet for Patients: EntrepreneurPulse.com.au  Fact Sheet for Healthcare Providers: IncredibleEmployment.be  This test is not yet approved or cleared by the [place] FDA and has been authorized for detection and/or diagnosis of SARS-CoV-2 by FDA under an Emergency Use Authorization (EUA). This EUA will remain in effect (meaning this test can be used) for the duration of the COVID-19 declaration under Section 564(b)(1) of the Act, 21 U.S.C. section 360bbb-3(b)(1), unless the authorization is terminated or revoked.  Performed at [facility], [address] [place], [place] [address]   Blood Culture (routine x 2)     Status: None (Preliminary result)   Collection Time: 04/28/21  3:57 AM   Specimen: BLOOD  Result Value Ref Range Status   Specimen Description BLOOD LEFT FA  Final   Special Requests   Final    BOTTLES DRAWN AEROBIC AND ANAEROBIC Blood Culture adequate volume   Culture   Final     NO GROWTH < 12 HOURS Performed at [facility], [address] [place], [place] [address]    Report Status PENDING  Incomplete  Blood Culture (routine x 2)     Status: None (Preliminary result)   Collection Time: 04/28/21  3:57 AM   Specimen: BLOOD  Result Value Ref Range Status   Specimen Description BLOOD LEFT HAND  Final   Special Requests   Final    BOTTLES DRAWN AEROBIC AND ANAEROBIC Blood Culture adequate volume   Culture   Final    NO GROWTH < 12 HOURS Performed at [facility], [address] [place], [place] [address]    Report Status PENDING  Incomplete  MRSA Next Gen by PCR, Nasal     Status: None   Collection Time: 04/28/21  7:52 AM   Specimen: Nasal Mucosa; Nasal Swab  Result Value Ref Range Status   MRSA by PCR Next Gen NOT DETECTED NOT DETECTED Final    Comment: (NOTE) The GeneXpert MRSA Assay (FDA approved for NASAL specimens only), is one component of a comprehensive MRSA colonization surveillance program. It is not intended to diagnose MRSA infection nor to guide or monitor treatment for MRSA infections. Test performance is not FDA approved in patients less than 95 years old. Performed at [facility], [address], [place], [place] [address]     Coagulation Studies: Recent Labs  04/28/21 0125  LABPROT 15.0  INR 1.2    Urinalysis: No results for input(s): COLORURINE, LABSPEC, PHURINE, GLUCOSEU, HGBUR, BILIRUBINUR, KETONESUR, PROTEINUR, UROBILINOGEN, NITRITE, LEUKOCYTESUR in the last 72 hours.  Invalid input(s): APPERANCEUR    Imaging: DG Chest 2 View  Result Date: 04/27/2021 CLINICAL DATA:  Shortness of breath. EXAM: CHEST - 2 VIEW COMPARISON:  None. FINDINGS: Mild to moderate severity diffusely increased interstitial lung markings are seen. Mild to moderate severity atelectatic changes are noted within the bilateral lung bases and mid right lung. Elevation of the right hemidiaphragm is seen. There is no  evidence of a pleural effusion or pneumothorax. Mild to moderate severity enlargement of the cardiac silhouette is seen with marked severity calcification of the aortic arch. Degenerative changes seen throughout the thoracic spine. IMPRESSION: 1. Mild to moderate severity interstitial edema with mild to moderate severity bilateral atelectasis. Electronically Signed   By: Virgina Norfolk M.D.   On: 04/27/2021 19:25   CT Chest Wo Contrast  Result Date: 04/28/2021 CLINICAL DATA:  Respiratory illness.  Shortness of breath. EXAM: CT CHEST WITHOUT CONTRAST TECHNIQUE: Multidetector CT imaging of the chest was performed following the standard protocol without IV contrast. COMPARISON:  Chest CT dated 10/02/2020. FINDINGS: Evaluation of this exam is limited in the absence of intravenous contrast. Cardiovascular: Mild cardiomegaly. No pericardial effusion. Advanced three-vessel coronary vascular calcification and calcification of the mitral annulus. There is moderate atherosclerotic calcification of the thoracic aorta. No aneurysmal dilatation. There is mild dilatation of main pulmonary trunk suggestive of pulmonary hypertension. Mediastinum/Nodes: No definite hilar adenopathy. Evaluation however is very limited in the absence of contrast and due to consolidative changes of the lungs. The esophagus is grossly unremarkable. No mediastinal fluid collection. Lungs/Pleura: Patchy areas of consolidation involving the right upper lobe, left lower lobe as well as patchy and streaky right lower lobe consolidation most consistent with pneumonia. Clinical correlation and follow-up to resolution recommended. No pleural effusion or pneumothorax. There is mucous secretion within the trachea and bilateral bronchi. The central airways however remain patent. Upper Abdomen: No acute findings. Similar appearance of the kidneys with parenchyma atrophy and hypodense lesions. 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 Degenerative changes of the spine. No acute osseous pathology. IMPRESSION: 1. Multifocal pneumonia. Clinical correlation and follow-up to resolution recommended. 2. Aortic Atherosclerosis (ICD10-I70.0). Electronically Signed   By: Anner Crete M.D.   On: 04/28/2021 02:06     Medications:    azithromycin Stopped (04/28/21 0546)   cefTRIAXone (ROCEPHIN)  IV Stopped (04/28/21 0442)    apixaban  2.5 mg Oral BID   furosemide  20 mg Intravenous Q12H   vancomycin variable dose per unstable renal function (pharmacist dosing)   Does not apply See admin instructions   acetaminophen **OR** acetaminophen, lactulose, ondansetron **OR** ondansetron (ZOFRAN) IV  Assessment/ Plan:  Cassandra Joseph is a 75 y.o.  female with past medical conditions including hypertension, anemia, multiple myeloma, chronic respiratory failure on home O2, and end stage renal disease on dialysis. Patient presents to ED from outpatient dialysis clinic with complaints of shortness of breath. Patient has been admitted for CHF (congestive heart failure) ([place]) [I50.9]  [facility]/TTS/Lt AVF  End stage renal disease on dialysis: will maintain schedule during this admission, if possible. Last treatment on 04/26/21 at outpatient center. Scheduled to receive dialysis tomorrow.   Anemia of chronic kidney disease Lab Results  Component Value Date   HGB 9.7 (L) 04/27/2021  Hgb at goal  Secondary Hyperparathyroidism:  Lab Results  Component Value Date   CALCIUM 9.1 04/27/2021  Calcium at goal  4.Hypertension with chronic kidney disease   Home regimen includes amlodipine, carvedilol, and midodrine  5. Multifocal pneumonia Receiving azithromycin and vancomycin Supportive care   LOS: 0 Cassandra Joseph 9/21/20229:50 AM

## 2021-04-28 NOTE — ED Notes (Signed)
Pt changed into hospital gown and repositioned in bed

## 2021-04-28 NOTE — H&P (Signed)
History and Physical    Cassandra Joseph WVP:[id number] DOB: March 30, 1946 DOA: 04/28/2021  PCP: Tracie Harrier, MD   Patient coming from: home  I have personally briefly reviewed patient's old medical records in [place]  Chief Complaint: shortness of breath  HPI: Cassandra Joseph is a 75 y.o. female with medical history significant for ESRD on HD TTS, anemia of CKD, PAF on Eliquis, multiple myeloma on chemotherapy, morbid obesity OSA on CPAP, chronic respiratory failure on home O2 at 3 L HTN, gout, rheumatoid arthritis, who presents to the ED from dialysis with a complaint of shortness of breath with O2 sats in the 80s while at dialysis.  Patient states she had been having a congested cough for the past week but no fever or chills.  Denies nausea vomiting or abdominal pain.  Denies diarrhea but endorses constipation.  ED course: On arrival afebrile with BP 138/67, pulse 85, respirations 22 with O2 sat 94% on O2 at home flow rate of 3 L WBC 4700 with hemoglobin 9.7 which is about her baseline.  BNP 1500 COVID and flu negative  EKG, personally viewed and interpreted: Atrial fibrillation at 87 with no acute ST-T wave changes  Imaging:  Chest x-ray showed mild to moderate interstitial edema CT chest without contrast: Multifocal pneumonia clinical correlation recommended  Hospitalist consulted for admission.  Review of Systems: As per HPI otherwise all other systems on review of systems negative.    Past Medical History:  Diagnosis Date   CHF (congestive heart failure) (HCC)    Hypertension    Renal disorder    Past medical history: Please see HPI  Past surgical history: Pending chart merge.  Reviewed  Substance abuse.  Denies alcohol or tobacco use  Family history reviewed and not pertinent     Prior to Admission medications   Not on File    Physical Exam: Vitals:   04/28/21 0003 04/28/21 0100 04/28/21 0200 04/28/21 0230  BP: (!) 141/68 123/78 (!) 142/75 (!)  141/71  Pulse: 85 92 88 84  Resp: 18 18 18 18   Temp: 98 F (36.7 C)     TempSrc: Oral     SpO2: 96% 100% 96% 95%  Height:         Vitals:   04/28/21 0003 04/28/21 0100 04/28/21 0200 04/28/21 0230  BP: (!) 141/68 123/78 (!) 142/75 (!) 141/71  Pulse: 85 92 88 84  Resp: 18 18 18 18   Temp: 98 F (36.7 C)     TempSrc: Oral     SpO2: 96% 100% 96% 95%  Height:          Constitutional: Chronically ill-appearing, oriented x 3 .  Mild tachypnea, conversational HEENT:      Head: Normocephalic and atraumatic.         Eyes: PERLA, EOMI, Conjunctivae are normal. Sclera is non-icteric.       Mouth/Throat: Mucous membranes are moist.       Neck: Supple with no signs of meningismus. Cardiovascular: Regular rate and rhythm. No murmurs, gallops, or rubs. 2+ symmetrical distal pulses are present . No JVD. No LE edema Respiratory: Respiratory effort increased with scattered wheezing.  Gastrointestinal: Soft, non tender, and non distended with positive bowel sounds.  Genitourinary: No CVA tenderness. Musculoskeletal: Nontender with normal range of motion in all extremities. No cyanosis, or erythema of extremities. Neurologic:  Face is symmetric. Moving all extremities. No gross focal neurologic deficits . Skin: Skin is warm, dry.  No rash or ulcers Psychiatric: Mood  and affect are normal    Labs on Admission: I have personally reviewed following labs and imaging studies  CBC: Recent Labs  Lab 04/27/21 1552  WBC 4.7  HGB 9.7*  HCT 30.9*  MCV 101.0*  PLT 846   Basic Metabolic Panel: Recent Labs  Lab 04/27/21 1552  NA 136  K 3.5  CL 96*  CO2 29  GLUCOSE 115*  BUN 28*  CREATININE 3.93*  CALCIUM 9.1   GFR: CrCl cannot be calculated (Unknown ideal weight.). Liver Function Tests: No results for input(s): AST, ALT, ALKPHOS, BILITOT, PROT, ALBUMIN in the last 168 hours. No results for input(s): LIPASE, AMYLASE in the last 168 hours. No results for input(s): AMMONIA in the last  168 hours. Coagulation Profile: No results for input(s): INR, PROTIME in the last 168 hours. Cardiac Enzymes: No results for input(s): CKTOTAL, CKMB, CKMBINDEX, TROPONINI in the last 168 hours. BNP (last 3 results) No results for input(s): PROBNP in the last 8760 hours. HbA1C: No results for input(s): HGBA1C in the last 72 hours. CBG: No results for input(s): GLUCAP in the last 168 hours. Lipid Profile: No results for input(s): CHOL, HDL, LDLCALC, TRIG, CHOLHDL, LDLDIRECT in the last 72 hours. Thyroid Function Tests: No results for input(s): TSH, T4TOTAL, FREET4, T3FREE, THYROIDAB in the last 72 hours. Anemia Panel: No results for input(s): VITAMINB12, FOLATE, FERRITIN, TIBC, IRON, RETICCTPCT in the last 72 hours. Urine analysis: No results found for: COLORURINE, APPEARANCEUR, LABSPEC, [place], GLUCOSEU, HGBUR, BILIRUBINUR, KETONESUR, PROTEINUR, UROBILINOGEN, NITRITE, LEUKOCYTESUR  Radiological Exams on Admission: DG Chest 2 View  Result Date: 04/27/2021 CLINICAL DATA:  Shortness of breath. EXAM: CHEST - 2 VIEW COMPARISON:  None. FINDINGS: Mild to moderate severity diffusely increased interstitial lung markings are seen. Mild to moderate severity atelectatic changes are noted within the bilateral lung bases and mid right lung. Elevation of the right hemidiaphragm is seen. There is no evidence of a pleural effusion or pneumothorax. Mild to moderate severity enlargement of the cardiac silhouette is seen with marked severity calcification of the aortic arch. Degenerative changes seen throughout the thoracic spine. IMPRESSION: 1. Mild to moderate severity interstitial edema with mild to moderate severity bilateral atelectasis. Electronically Signed   By: Virgina Norfolk M.D.   On: 04/27/2021 19:25   CT Chest Wo Contrast  Result Date: 04/28/2021 CLINICAL DATA:  Respiratory illness.  Shortness of breath. EXAM: CT CHEST WITHOUT CONTRAST TECHNIQUE: Multidetector CT imaging of the chest was  performed following the standard protocol without IV contrast. COMPARISON:  Chest CT dated 10/02/2020. FINDINGS: Evaluation of this exam is limited in the absence of intravenous contrast. Cardiovascular: Mild cardiomegaly. No pericardial effusion. Advanced three-vessel coronary vascular calcification and calcification of the mitral annulus. There is moderate atherosclerotic calcification of the thoracic aorta. No aneurysmal dilatation. There is mild dilatation of main pulmonary trunk suggestive of pulmonary hypertension. Mediastinum/Nodes: No definite hilar adenopathy. Evaluation however is very limited in the absence of contrast and due to consolidative changes of the lungs. The esophagus is grossly unremarkable. No mediastinal fluid collection. Lungs/Pleura: Patchy areas of consolidation involving the right upper lobe, left lower lobe as well as patchy and streaky right lower lobe consolidation most consistent with pneumonia. Clinical correlation and follow-up to resolution recommended. No pleural effusion or pneumothorax. There is mucous secretion within the trachea and bilateral bronchi. The central airways however remain patent. Upper Abdomen: No acute findings. Similar appearance of the kidneys with parenchyma atrophy and hypodense lesions. 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  Degenerative changes of the spine. No acute osseous pathology. IMPRESSION: 1. Multifocal pneumonia. Clinical correlation and follow-up to resolution recommended. 2. Aortic Atherosclerosis (ICD10-I70.0). Electronically Signed   By: Anner Crete M.D.   On: 04/28/2021 02:06     Assessment/Plan 75 year old female with history of ESRD on HD TTS, anemia of CKD, PAF on Eliquis, multiple myeloma on chemotherapy, morbid obesity OSA on CPAP, chronic respiratory failure on home O2 at 3 L HTN, rheumatoid arthritis, who presents to the ED from dialysis with a complaint of shortness of breath with O2  sats in the 80s while at dialysis.  History of cough x1 week  Acute dyspnea Chronic respiratory failure with hypoxia - Patient with shortness of breath, hypoxic at dialysis to the 80s with work-up showing BNP of 1500, chest x-ray with interstitial edema and CT chest with multifocal pneumonia - No history of CHF, with last echo January 2022 showing EF 60 to 65% - Oxygen requirement on arrival is at baseline with sats in the 90s on 3 L - Continue supplemental oxygen - Treat possible fluid overload/CHF and possible pneumonia    Multifocal pneumonia - Shortness of breath and hypoxia and 1 week history of cough but no fever - CT with multifocal pneumonia but no fever or leukocytosis -COVID and flu negative negative - Rocephin azithromycin and vancomycin and de-escalate if procalcitonin negative - Supplemental oxygen, antitussives - Follow blood and sputum cultures  Possible fluid overload - Possibly related to insufficient fluid removal at dialysis - BNP over 1500 and chest x-ray with interstitial edema - No prior history of CHF, had echo January 2022 with EF 60 to 65% - Daily weights with intake and output monitoring - Echocardiogram in the a.m. - No missed dialysis sessions    ESRD on hemodialysis [facility]) - Nephrology consult for continuation of dialysis  Anemia of ESRD - Hemoglobin at baseline.  Continue to monitor    PAF (paroxysmal atrial fibrillation) (HCC)   Chronic anticoagulation -Continue Coreg and Eliquis    Rheumatoid arthritis ([place]) - Not acutely flared.  Continue home meds pending med rec    OSA on CPAP - Continue CPAP    Obesity, Class III, BMI 40-49.9 (morbid obesity) (HCC) - Complicating factor to overall prognosis and care    Multiple myeloma in relapse ([place]) - On chemotherapy - Consider inpatient oncology consult    DVT prophylaxis: eliquis Code Status: full code  Family Communication:  none  Disposition Plan: Back to previous home environment Consults  called: n nephrology Status:At the time of admission, it appears that the appropriate admission status for this patient is INPATIENT. This is judged to be reasonable and necessary in order to provide the required intensity of service to ensure the patient's safety given the presenting symptoms, physical exam findings, and initial radiographic and laboratory data in the context of their  Comorbid conditions.   Patient requires inpatient status due to high intensity of service, high risk for further deterioration and high frequency of surveillance required.   I certify that at the point of admission it is my clinical judgment that the patient will require inpatient hospital care spanning beyond [place] MD [facility]     04/28/2021, 3:22 AM

## 2021-04-28 NOTE — ED Provider Notes (Signed)
[facility] Emergency Department Provider Note  ____________________________________________   Event Date/Time   First MD Initiated Contact with Patient 04/28/21 0025     (approximate)  I have reviewed the triage vital signs and the nursing notes.   HISTORY  Chief Complaint Shortness of Breath    HPI Cassandra Joseph is a 75 y.o. female with medical history as listed below which notably includes end-stage renal disease on hemodialysis Tuesdays, Thursdays, and Saturdays.  She presents for worsening shortness of breath.  She reports that her shortness of breath has been worsening over the last couple of days.  She had a full course of dialysis today but actually felt more short of breath after the dialysis.  She is on 3 L of oxygen at all times and reportedly her oxygen level was lower while she was in dialysis and they strongly encouraged her to come to the ED and in fact called EMS to bring her.  She confirms the shortness of breath, worse with lying flat and with exertion.  Denies chest pain.  Denies abdominal pain, nausea, vomiting.  She reports no urine production for 15 years.  The patient did not tell me this, but according to EMS, she has been taking antibiotics for pneumonia.  She reports that her shortness of breath has become severe and nothing in particular makes it better.     Past Medical History:  Diagnosis Date   CHF (congestive heart failure) ([place])    Hypertension    Renal disorder     Patient Active Problem List   Diagnosis Date Noted   ESRD on hemodialysis ([place]) 04/28/2021   PAF (paroxysmal atrial fibrillation) ([place]) 04/28/2021   Chronic anticoagulation 04/28/2021   Chronic respiratory failure with hypoxia ([place]) 04/28/2021   Rheumatoid arthritis ([place]) 04/28/2021   OSA on CPAP 04/28/2021   Obesity, Class III, BMI 40-49.9 (morbid obesity) ([place]) 04/28/2021   Multiple myeloma in relapse ([place]) 04/28/2021   Multifocal pneumonia  04/28/2021   CHF (congestive heart failure) ([place]) 04/28/2021    History reviewed. No pertinent surgical history.  Prior to Admission medications   Not on File    Allergies Patient has no allergy information on record.  No family history on file.  Social History    Review of Systems Constitutional: No fever/chills Eyes: No visual changes. ENT: No sore throat. Cardiovascular: Denies chest pain. Respiratory: Positive for shortness of breath, exertional dyspnea, and orthopnea Gastrointestinal: No abdominal pain.  No nausea, no vomiting.  No diarrhea.  No constipation. Genitourinary: Negative for dysuria. Musculoskeletal: Negative for neck pain.  Negative for back pain. Integumentary: Negative for rash. Neurological: Negative for headaches, focal weakness or numbness.   ____________________________________________   PHYSICAL EXAM:  VITAL SIGNS: ED Triage Vitals  Enc Vitals Group     BP 04/27/21 1550 138/67     Pulse Rate 04/27/21 1550 85     Resp 04/27/21 1549 (!) 22     Temp 04/27/21 1549 98.3 F (36.8 C)     Temp Source 04/27/21 1549 Oral     SpO2 04/27/21 1550 94 %     Weight --      Height 04/27/21 1549 1.676 m (5' 6" )     Head Circumference --      Peak Flow --      Pain Score 04/27/21 1549 0     Pain Loc --      Pain Edu? --      Excl. in [place]? --  Constitutional: Alert and oriented.  Eyes: Conjunctivae are normal.  Head: Atraumatic. Nose: No congestion/rhinnorhea. Mouth/Throat: Patient is wearing a mask. Neck: No stridor.  No meningeal signs.   Cardiovascular: Normal rate, regular rhythm. Good peripheral circulation. Respiratory: Increased respiratory effort, coarse breath sounds throughout, baseline oxygen requirement is slightly increased. Gastrointestinal: Soft and nontender. No distention.  Musculoskeletal: No lower extremity tenderness nor edema. No gross deformities of extremities. Neurologic:  Normal speech and language. No gross focal  neurologic deficits are appreciated.  Skin:  Skin is warm, dry and intact. Psychiatric: Mood and affect are normal. Speech and behavior are normal.  ____________________________________________   LABS (all labs ordered are listed, but only abnormal results are displayed)  Labs Reviewed  CBC - Abnormal; Notable for the following components:      Result Value   RBC 3.06 (*)    Hemoglobin 9.7 (*)    HCT 30.9 (*)    MCV 101.0 (*)    All other components within normal limits  BASIC METABOLIC PANEL - Abnormal; Notable for the following components:   Chloride 96 (*)    Glucose, Bld 115 (*)    BUN 28 (*)    Creatinine, Ser 3.93 (*)    GFR, Estimated 11 (*)    All other components within normal limits  BRAIN NATRIURETIC PEPTIDE - Abnormal; Notable for the following components:   B Natriuretic Peptide 1,496.5 (*)    All other components within normal limits  APTT - Abnormal; Notable for the following components:   aPTT 43 (*)    All other components within normal limits  RESP PANEL BY RT-PCR (FLU A&B, COVID) ARPGX2  CULTURE, BLOOD (ROUTINE X 2)  CULTURE, BLOOD (ROUTINE X 2)  EXPECTORATED SPUTUM ASSESSMENT W GRAM STAIN, RFLX TO RESP C  MRSA NEXT GEN BY PCR, NASAL  LACTIC ACID, PLASMA  PROTIME-INR  LACTIC ACID, PLASMA  LEGIONELLA PNEUMOPHILA SEROGP 1 UR AG  STREP PNEUMONIAE URINARY ANTIGEN  PROCALCITONIN   ____________________________________________  EKG  ED ECG REPORT I, Hinda Kehr, the attending physician, personally viewed and interpreted this ECG.  Date: 04/27/2021 EKG Time: 15: 53 Rate: 87 Rhythm: Atrial fibrillation QRS Axis: normal Intervals: Abnormal due to A. fib ST/T Wave abnormalities: Non-specific ST segment / T-wave changes, but no clear evidence of acute ischemia. Narrative Interpretation: no definitive evidence of acute ischemia; does not meet STEMI criteria.  ____________________________________________  RADIOLOGY I, Hinda Kehr, personally viewed  and evaluated these images (plain radiographs) as part of my medical decision making, as well as reviewing the written report by the radiologist.  ED MD interpretation: Interstitial edema versus infection on chest x-ray.  Multifocal pneumonia on CT chest  Official radiology report(s): DG Chest 2 View  Result Date: 04/27/2021 CLINICAL DATA:  Shortness of breath. EXAM: CHEST - 2 VIEW COMPARISON:  None. FINDINGS: Mild to moderate severity diffusely increased interstitial lung markings are seen. Mild to moderate severity atelectatic changes are noted within the bilateral lung bases and mid right lung. Elevation of the right hemidiaphragm is seen. There is no evidence of a pleural effusion or pneumothorax. Mild to moderate severity enlargement of the cardiac silhouette is seen with marked severity calcification of the aortic arch. Degenerative changes seen throughout the thoracic spine. IMPRESSION: 1. Mild to moderate severity interstitial edema with mild to moderate severity bilateral atelectasis. Electronically Signed   By: Virgina Norfolk M.D.   On: 04/27/2021 19:25   CT Chest Wo Contrast  Result Date: 04/28/2021 CLINICAL DATA:  Respiratory illness.  Shortness of breath. EXAM: CT CHEST WITHOUT CONTRAST TECHNIQUE: Multidetector CT imaging of the chest was performed following the standard protocol without IV contrast. COMPARISON:  Chest CT dated 10/02/2020. FINDINGS: Evaluation of this exam is limited in the absence of intravenous contrast. Cardiovascular: Mild cardiomegaly. No pericardial effusion. Advanced three-vessel coronary vascular calcification and calcification of the mitral annulus. There is moderate atherosclerotic calcification of the thoracic aorta. No aneurysmal dilatation. There is mild dilatation of main pulmonary trunk suggestive of pulmonary hypertension. Mediastinum/Nodes: No definite hilar adenopathy. Evaluation however is very limited in the absence of contrast and due to consolidative  changes of the lungs. The esophagus is grossly unremarkable. No mediastinal fluid collection. Lungs/Pleura: Patchy areas of consolidation involving the right upper lobe, left lower lobe as well as patchy and streaky right lower lobe consolidation most consistent with pneumonia. Clinical correlation and follow-up to resolution recommended. No pleural effusion or pneumothorax. There is mucous secretion within the trachea and bilateral bronchi. The central airways however remain patent. Upper Abdomen: No acute findings. Similar appearance of the kidneys with parenchyma atrophy and hypodense lesions. 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 Degenerative changes of the spine. No acute osseous pathology. IMPRESSION: 1. Multifocal pneumonia. Clinical correlation and follow-up to resolution recommended. 2. Aortic Atherosclerosis (ICD10-I70.0). Electronically Signed   By: Anner Crete M.D.   On: 04/28/2021 02:06    ____________________________________________   PROCEDURES   Procedure(s) performed (including Critical Care):  Procedures   ____________________________________________   INITIAL IMPRESSION / MDM / [place] / ED COURSE  As part of my medical decision making, I reviewed the following data within the [place] notes reviewed and incorporated, Labs reviewed , EKG interpreted , Old chart reviewed, Radiograph reviewed , Discussed with admitting physician , and Notes from prior ED visits   Differential diagnosis includes, but is not limited to, CHF, pneumonia, COVID.  The patient is on the cardiac monitor to evaluate for evidence of arrhythmia and/or significant heart rate changes.  I suspect that there is a strong element of heart failure and volume overload, but the patient may also have an infectious process.  Vital signs are generally reassuring and she is currently satting appropriately on her normal baseline  oxygen.  Lactic acid is normal, COVID-negative, BNP elevated at about 1500, CBC normal, basic metabolic panel is essentially normal other than her elevated creatinine.  She produces no urine so there is no role for a diuretic.  I obtained a CT chest to further assess edema versus infection and it is positive for multifocal pneumonia.  It seems that she has failed outpatient antibiotics.  I ordered cefepime 2 g IV and vancomycin per pharmacy consult for healthcare associated pneumonia given her comorbidities.  I will discussed the case with the hospitalist.     Clinical Course as of 04/28/21 0538  Wed Apr 28, 2021  0240 B Natriuretic Peptide(!): 1,496.5 [CF]  6606 Discussed case by phone with Dr. Damita Dunnings with the hospitalist service and she will admit [CF]    Clinical Course User Index [CF] Hinda Kehr, MD     ____________________________________________  FINAL CLINICAL IMPRESSION(S) / ED DIAGNOSES  Final diagnoses:  Multifocal pneumonia  Acute on chronic congestive heart failure, unspecified heart failure type ([place])  ESRD on hemodialysis ([place])  Acute dyspnea     MEDICATIONS GIVEN DURING THIS VISIT:  Medications  cefTRIAXone (ROCEPHIN) 2 g in sodium chloride 0.9 % 100 mL IVPB (0 g Intravenous Stopped 04/28/21 0442)  azithromycin (ZITHROMAX) 500 mg in sodium chloride 0.9 % 250 mL IVPB (500 mg Intravenous New Bag/Given 04/28/21 0446)  furosemide (LASIX) injection 20 mg (20 mg Intravenous Not Given 04/28/21 0412)  acetaminophen (TYLENOL) tablet 650 mg (has no administration in time range)    Or  acetaminophen (TYLENOL) suppository 650 mg (has no administration in time range)  ondansetron (ZOFRAN) tablet 4 mg (has no administration in time range)    Or  ondansetron (ZOFRAN) injection 4 mg (has no administration in time range)  vancomycin (VANCOCIN) IVPB 1000 mg/200 mL premix (1,000 mg Intravenous New Bag/Given 04/28/21 0447)    Followed by  vancomycin (VANCOREADY) IVPB 1500 mg/300 mL  (has no administration in time range)  vancomycin variable dose per unstable renal function (pharmacist dosing) (has no administration in time range)  apixaban (ELIQUIS) tablet 2.5 mg (has no administration in time range)  lactulose (CHRONULAC) 10 GM/15ML solution 20 g (has no administration in time range)     ED Discharge Orders     None        Note:  This document was prepared using Dragon voice recognition software and may include unintentional dictation errors.   Hinda Kehr, MD 04/28/21 [phone]

## 2021-04-28 NOTE — ED Notes (Signed)
Pts daughter, [name] updated per permission from pt

## 2021-04-28 NOTE — Progress Notes (Signed)
ANTICOAGULATION CONSULT NOTE  Pharmacy Consult for apixaban dosing Indication: non-valvular atrial fibrillation  Patient Measurements: Height: 5\' 6"  (167.6 cm) Weight: 115.2 kg (253 lb 15.5 oz) IBW/kg (Calculated) : 59.3   Vital Signs: Temp: 98 F (36.7 C) (09/21 0003) Temp Source: Oral (09/21 0003) BP: 141/71 (09/21 0230) Pulse Rate: 84 (09/21 0230)  Labs: Recent Labs    04/27/21 1552  HGB 9.7*  HCT 30.9*  PLT 160  CREATININE 3.93*    Estimated Creatinine Clearance: 16.2 mL/min (A) (by C-G formula based on SCr of 3.93 mg/dL (H)).   Medical History: Past Medical History:  Diagnosis Date   CHF (congestive heart failure) (HCC)    Hypertension    Renal disorder     Medications:  PTA Med:  Apixaban 2.5 mg BID  Assessment: Pt is 75 yo female w/ ESRD on HD (TThSa) and on Apixaban for A fib, presenting to ED w/ SOB.    Monitor platelets by anticoagulation protocol: Yes   Plan: Continuing Apixaban at PTA dose per MD consult request.  Will continue to monitor CBC and SCr at least weekly or more frequently as needed.  Renda Rolls, PharmD, [facility] 04/28/2021 4:47 AM

## 2021-04-28 NOTE — Progress Notes (Addendum)
  PROGRESS NOTE    Cassandra Joseph  GEZ:[id number] DOB: 09/20/1945 DOA: 04/28/2021  PCP: Tracie Harrier, MD    LOS - 0    Patient admitted after midnight with multifocal pneumonia and possible volume overload.  Started on empiric IV antibiotics.  Interval subjective: Patient seen in the ED this morning holding for bed.  She reports feeling weak and tired.  Denies any cough fever or chills.  Confirms she uses 3 L/min supplemental oxygen at home.  Exam: Awake and alert, no acute distress, lungs with bilateral crackles and right worse than left rhonchi, normal respiratory effort on 3 L/min nasal cannula O2. Heart regular rate and rhythm Abdomen soft nontender No lower extremity edema Neuro alert and oriented x3, no gross focal motor deficits, normal speech   Principal Problem:   Multifocal pneumonia Active Problems:   ESRD on hemodialysis (HCC)   PAF (paroxysmal atrial fibrillation) (HCC)   Chronic anticoagulation   Chronic respiratory failure with hypoxia (HCC)   Rheumatoid arthritis (HCC)   OSA on CPAP   Obesity, Class III, BMI 40-49.9 (morbid obesity) ([place])   Multiple myeloma in relapse (HCC)   CHF (congestive heart failure) ([place])    I have reviewed the full H&P by Dr. Damita Dunnings in detail, and I agree with the assessment and plan as outlined therein. In addition: --DC vancomycin given MRSA screen negative --Continue Rocephin and Zithromax --Med history completed and reviewed -resume home meds: Plaquenil, Renvela, Eliquis, gabapentin, Flonase --Continue holding home midodrine, amlodipine and Coreg -- Lasix discontinued as patient is anuric  No Charge    Ezekiel Slocumb, DO [facility]   If 7PM-7AM, please contact night-coverage [email or web address] 04/28/2021, 8:43 AM

## 2021-04-28 NOTE — ED Notes (Signed)
Pt to CT

## 2021-04-28 NOTE — Progress Notes (Signed)
PHARMACY -  BRIEF ANTIBIOTIC NOTE   Pharmacy has received consult(s) for Vancomycin from an ED provider.  The patient's profile has been reviewed for ht/wt/allergies/indication/available labs.    One time order(s) placed for Vancomycin 2500 mg per pt wt: 115.2 kg  Further antibiotics/pharmacy consults should be ordered by admitting physician if indicated.                       Renda Rolls, PharmD, [facility] 04/28/2021 3:54 AM

## 2021-04-28 NOTE — ED Notes (Signed)
Dr. Damita Dunnings informed of the patients inability to make urine. Medication and urine specimen orders modified by MD.

## 2021-04-28 NOTE — Progress Notes (Signed)
Pharmacy Antibiotic Note  Cassandra Joseph is a 75 y.o. female admitted on 04/28/2021 with pneumonia.  Pharmacy has been consulted for Vancomycin dosing.  Pt med hx includes ESRD on HD.  Plan: Pt ordered initial dose of Vancomycin 2500 mg. Due to ESRD, will enter pharmacy variable dosing pending next dialysis session.  Pharmacy will continue to follow.  Height: 5\' 6"  (167.6 cm) Weight: 115.2 kg (253 lb 15.5 oz) IBW/kg (Calculated) : 59.3  Temp (24hrs), Avg:98.2 F (36.8 C), Min:98 F (36.7 C), Max:98.3 F (36.8 C)  Recent Labs  Lab 04/27/21 1552  WBC 4.7  CREATININE 3.93*    Estimated Creatinine Clearance: 16.2 mL/min (A) (by C-G formula based on SCr of 3.93 mg/dL (H)).    Not on File  Antimicrobials this admission: 9/21 Azithromycin >> x 5 days 9/21 Ceftriaxone >> x 5 days 9/21 Vancomycin >>  Microbiology results: 9/21 BCx: Pending 9/21 Sputum: Sent   Thank you for allowing pharmacy to be a part of this patient's care.  Renda Rolls, PharmD, [facility] 04/28/2021 4:03 AM

## 2021-04-28 NOTE — Progress Notes (Signed)
*  PRELIMINARY RESULTS* Echocardiogram 2D Echocardiogram has been performed.  Cassandra Joseph 04/28/2021, 2:29 PM

## 2021-04-29 ENCOUNTER — Encounter: Payer: Self-pay | Admitting: Internal Medicine

## 2021-04-29 DIAGNOSIS — J189 Pneumonia, unspecified organism: Secondary | ICD-10-CM | POA: Diagnosis not present

## 2021-04-29 LAB — CBC
HCT: 32 % — ABNORMAL LOW (ref 36.0–46.0)
Hemoglobin: 10 g/dL — ABNORMAL LOW (ref 12.0–15.0)
MCH: 32.3 pg (ref 26.0–34.0)
MCHC: 31.3 g/dL (ref 30.0–36.0)
MCV: 103.2 fL — ABNORMAL HIGH (ref 80.0–100.0)
Platelets: 190 10*3/uL (ref 150–400)
RBC: 3.1 MIL/uL — ABNORMAL LOW (ref 3.87–5.11)
RDW: 14.9 % (ref 11.5–15.5)
WBC: 5.8 10*3/uL (ref 4.0–10.5)
nRBC: 0 % (ref 0.0–0.2)

## 2021-04-29 LAB — MAGNESIUM: Magnesium: 1.9 mg/dL (ref 1.7–2.4)

## 2021-04-29 LAB — HEPATITIS B SURFACE ANTIGEN: Hepatitis B Surface Ag: NONREACTIVE

## 2021-04-29 LAB — BASIC METABOLIC PANEL
Anion gap: 12 (ref 5–15)
BUN: 44 mg/dL — ABNORMAL HIGH (ref 8–23)
CO2: 28 mmol/L (ref 22–32)
Calcium: 9.3 mg/dL (ref 8.9–10.3)
Chloride: 96 mmol/L — ABNORMAL LOW (ref 98–111)
Creatinine, Ser: 6.07 mg/dL — ABNORMAL HIGH (ref 0.44–1.00)
GFR, Estimated: 7 mL/min — ABNORMAL LOW (ref >60)
Glucose, Bld: 77 mg/dL (ref 70–99)
Potassium: 4 mmol/L (ref 3.5–5.1)
Sodium: 136 mmol/L (ref 135–145)

## 2021-04-29 LAB — HEPATITIS B SURFACE ANTIBODY,QUALITATIVE: Hep B S Ab: REACTIVE — AB

## 2021-04-29 MED ORDER — PENTAFLUOROPROP-TETRAFLUOROETH EX AERO
1.0000 "application " | INHALATION_SPRAY | CUTANEOUS | Status: DC | PRN
  Filled 2021-04-29: qty 30

## 2021-04-29 MED ORDER — LIDOCAINE HCL (PF) 1 % IJ SOLN
5.0000 mL | INTRAMUSCULAR | Status: DC | PRN
Start: 2021-04-29 — End: 2021-04-29
  Filled 2021-04-29: qty 5

## 2021-04-29 MED ORDER — HEPARIN SODIUM (PORCINE) 1000 UNIT/ML DIALYSIS
1000.0000 [IU] | INTRAMUSCULAR | Status: DC | PRN
  Filled 2021-04-29: qty 1

## 2021-04-29 MED ORDER — SODIUM CHLORIDE 0.9 % IV SOLN: 100.0000 mL | INTRAVENOUS | Status: DC | PRN

## 2021-04-29 MED ORDER — LIDOCAINE-PRILOCAINE 2.5-2.5 % EX CREA
1.0000 "application " | TOPICAL_CREAM | CUTANEOUS | Status: DC | PRN
  Filled 2021-04-29: qty 5

## 2021-04-29 MED ORDER — ALTEPLASE 2 MG IJ SOLR: 2.0000 mg | Freq: Once | INTRAMUSCULAR | Status: DC | PRN

## 2021-04-29 MED ORDER — RENA-VITE PO TABS
1.0000 | ORAL_TABLET | Freq: Every day | ORAL | Status: DC
  Administered 2021-04-29 – 2021-04-30 (×2): 1 via ORAL
  Filled 2021-04-29 (×3): qty 1

## 2021-04-29 MED ORDER — NEPRO/CARBSTEADY PO LIQD
237.0000 mL | Freq: Three times a day (TID) | ORAL | Status: DC
  Administered 2021-04-29 – 2021-05-01 (×5): 237 mL via ORAL

## 2021-04-29 NOTE — Progress Notes (Signed)
Pt w/RUA AVF, cannulated with ease, maintained prescribed BFR, met targeted UF. Fluid removal 1503. No med's given. VSS. Returned to unit.

## 2021-04-29 NOTE — Progress Notes (Signed)
PT Cancellation Note  Patient Details Name: Geniece Akers MRN: [id number] DOB: 05-05-1946   Cancelled Treatment:    Reason Eval/Treat Not Completed: Patient at procedure or test/unavailable PT orders received, pt out of room for dialysis.  Will maintain on caseload and attempt to eval at later time/date as appropriate.  Kreg Shropshire, DPT 04/29/2021, 1:07 PM

## 2021-04-29 NOTE — Progress Notes (Signed)
Initial Nutrition Assessment  DOCUMENTATION CODES:   Obesity unspecified  INTERVENTION:   Nepro Shake po TID, each supplement provides 425 kcal and 19 grams protein  Rena-vit po daily   NUTRITION DIAGNOSIS:   Increased nutrient needs related to chronic illness (ESRD on HD) as evidenced by estimated needs.  GOAL:   Patient will meet greater than or equal to 90% of their needs  MONITOR:   PO intake, Supplement acceptance, Labs, Weight trends, Skin, I & O's  REASON FOR ASSESSMENT:   Malnutrition Screening Tool    ASSESSMENT:   75 y.o. female with medical history significant for ESRD on HD TTS, anemia of CKD, PAF on Eliquis, multiple myeloma on chemotherapy, morbid obesity OSA on CPAP, chronic respiratory failure on home O2 at 3 L, HTN, gout and rheumatoid arthritis who is admitted with suspected PNA and volume overload.  Unable to see patient today as pt in dialysis at time of RD visit. RD will add supplements and vitamins to help pt meet her estimated needs and to replace losses from HD. There is no weight history in chart to determine if any significant recent weight changes. RD will follow up to obtain nutrition related history and exam.   Medications reviewed and include: renvela, azithromycin, ceftriaxone   Labs reviewed: BUN 44(H), creat 6.07(H), Mg 1.9 wnl BNP- 1496.5(H) Hgb 10.0(L), Hct 32.0(L)  NUTRITION - FOCUSED PHYSICAL EXAM: Unable to perform at this time   Diet Order:    Diet Order             Diet Heart Room service appropriate? Yes; Fluid consistency: Thin  Diet effective now                  EDUCATION NEEDS:   No education needs have been identified at this time  Skin:  Skin Assessment: Reviewed RN Assessment  Last BM:  9/21  Height:   Ht Readings from Last 1 Encounters:  04/29/21 5' 6"  (1.676 m)    Weight:   Wt Readings from Last 1 Encounters:  04/29/21 112 kg    Ideal Body Weight:  59 kg  BMI:  Body mass index is 39.85  kg/m.  Estimated Nutritional Needs:   Kcal:  2200-2500kcal/day  Protein:  110-125g/day  Fluid:  UOP +1L  Koleen Distance MS, RD, LDN Please refer to [facility] for RD and/or RD on-call/weekend/after hours pager

## 2021-04-29 NOTE — Progress Notes (Signed)
OT Cancellation Note  Patient Details Name: Talina Pleitez MRN: [id number] DOB: August 08, 1946   Cancelled Treatment:    Reason Eval/Treat Not Completed: Patient at procedure or test/ unavailable. Orders received. Pt off the unit for HD, will continue to follow and initiate services at later date/time as available.   Dessie Coma, M.S. OTR/L  04/29/21, 1:46 PM  ascom [phone]

## 2021-04-29 NOTE — Progress Notes (Signed)
PROGRESS NOTE    Cassandra Joseph   ZDG:[id number]  DOB: [birth year]  PCP: Tracie Harrier, MD    DOA: 04/28/2021 LOS: 1    Brief Narrative / Hospital Course to Date:   75 year old female with past medical history of ESRD on dialysis TTS, anemia of chronic disease, paroxysmal A. 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  Evaluation in the ED revealed multifocal pneumonia on CT chest without contrast.  Patient was started on empiric IV antibiotics and admitted to [facility] service.  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 --Continue Rocephin and Zithromax --Vancomycin DC'd when MRSA screen returned negative --Supportive care: Antitussives --Questionable volume overload on admission, volume management by hemodialysis  Chronic respiratory failure with hypoxia -patient has been stable on baseline 3 L/min O2. --Supplement O2 to maintain sats above 88%  Obesity: Body mass index is 39.85 kg/m.  Complicates overall care and prognosis.  Recommend lifestyle modifications including physical activity and diet for weight loss and overall long-term health.  Fluid overload ruled out -BNP  was elevated over 1500 but in setting of ESRD, chest x-ray with interstitial edema but CT chest more consistent with multifocal pneumonia. No prior history of CHF.  Echo in January 2022 with EF 60 to 65%.  Echo this admission EF 55 to 60%  ESRD -on hemodialysis Tuesday Thursday Saturday --Nephrology following for dialysis  Anemia of chronic disease -hemoglobin stable and at baseline.  Continue monitor CBC.  Paroxysmal A. Fib -rate controlled.  Continue Coreg and Eliquis  Rheumatoid arthritis -stable without acute flare symptoms.  Continue home Plaquenil.  OSA on CPAP -nightly CPAP ordered  Multiple myeloma, relapsed -no acute issues related to this therefore will defer oncology consult.  Monitor CBC and renal function.  Home meds on hold: Midodrine, amlodipine, Coreg Lasix DC'd given patient is anuric.  DVT prophylaxis: apixaban (ELIQUIS) tablet 2.5 mg Start: 04/28/21 1000 apixaban (ELIQUIS) tablet 2.5 mg   Diet:  Diet Orders (From admission, onward)     Start     Ordered   04/29/21 1125  Diet 2 gram sodium Room service appropriate? Yes; Fluid consistency: Thin  Diet effective now       Question Answer Comment  Room service appropriate? Yes   Fluid consistency: Thin      04/29/21 1124              Code Status: Full Code   Subjective 04/29/21    Patient seen today on rounds after dialysis.  Reports he tolerated it well.  Continues to have a occasional cough but denies fevers or chills.  Overall beginning to feel better.  No other acute complaints.   Disposition Plan & Communication   Status  is: Inpatient  Remains inpatient appropriate because:IV treatments appropriate due to intensity of illness or inability to take PO  Dispo: The patient is from: Home              Anticipated d/c is to: Home              Patient currently is not medically stable to d/c.   Difficult to place patient No    Consults, Procedures, Significant Events   Consultants:   Nephrology  Procedures:  Routine hemodialysis  Antimicrobials:  Anti-infectives (From admission, onward)    Start     Dose/Rate Route Frequency Ordered Stop   04/28/21 1700  hydroxychloroquine (PLAQUENIL) tablet 200 mg        200 mg Oral Daily 04/28/21 1632     04/28/21 0500  vancomycin (VANCOREADY) IVPB 1500 mg/300 mL       See Hyperspace for full Linked Orders Report.   1,500 mg 150 mL/hr over 120 Minutes Intravenous  Once 04/28/21 0352 04/28/21 0753   04/28/21 0405  vancomycin variable dose per unstable renal function (pharmacist dosing)  Status:  Discontinued         Does not apply See admin instructions 04/28/21 0405 04/28/21 1627   04/28/21 0400  cefTRIAXone (ROCEPHIN) 2 g in sodium chloride 0.9 % 100 mL IVPB        2 g 200 mL/hr over 30 Minutes Intravenous Every 24 hours 04/28/21 0352 05/03/21 0359   04/28/21 0400  azithromycin (ZITHROMAX) 500 mg in sodium chloride 0.9 % 250 mL IVPB        500 mg 250 mL/hr over 60 Minutes Intravenous Every 24 hours 04/28/21 0352 05/03/21 0359   04/28/21 0400  vancomycin (VANCOCIN) IVPB 1000 mg/200 mL premix       See Hyperspace for full Linked Orders Report.   1,000 mg 200 mL/hr over 60 Minutes Intravenous  Once 04/28/21 0352 04/28/21 0547   04/28/21 0345  ceFEPIme (MAXIPIME) 1 g in sodium chloride 0.9 % 100 mL IVPB  Status:  Discontinued        1 g 200 mL/hr over 30 Minutes Intravenous  Once 04/28/21 0331 04/28/21 0359         Micro    Objective   Vitals:   04/29/21 1300 04/29/21 1315 04/29/21 1330 04/29/21 1426  BP: 135/71 (!) 151/64 138/64 116/64  Pulse: 80 74 82 85  Resp: _0 Temp:    99.2 F (37.3 C)  TempSrc:      SpO2: 98% 98% 99% 94%  Weight:      Height:        Intake/Output Summary (Last 24 hours) at 04/29/2021 1613 Last data filed at 04/29/2021 1330 Gross per 24 hour  Intake 240 ml  Output 1503 ml  Net -1263 ml   Filed Weights   04/28/21 0348 04/29/21 0227  Weight: 115.2 kg 112 kg    Physical  Exam:  General exam: awake, alert, no acute distress, obese Respiratory system: CTAB diminished bases, no wheezes or rhonchi, normal respiratory effort, on 3 L/min nasal cannula oxygen (baseline). Cardiovascular system: normal S1/S2, RRR, no pedal edema.   Central nervous system: A&O x3. no gross focal neurologic deficits, normal speech Extremities: moves all, no edema, normal tone Psychiatry: normal mood, congruent affect, judgement and insight appear normal  Labs   Data Reviewed: I have personally reviewed following labs and imaging studies  CBC: Recent Labs  Lab 04/27/21 1552 04/29/21 0500  WBC 4.7  5.8  HGB 9.7* 10.0*  HCT 30.9* 32.0*  MCV 101.0* 103.2*  PLT 160 161   Basic Metabolic Panel: Recent Labs  Lab 04/27/21 1552 04/29/21 0500  NA 136 136  K 3.5 4.0  CL 96* 96*  CO2 29 28  GLUCOSE 115* 77  BUN 28* 44*  CREATININE 3.93* 6.07*  CALCIUM 9.1 9.3  MG  --  1.9   GFR: Estimated Creatinine Clearance: 10.3 mL/min (A) (by C-G formula based on SCr of 6.07 mg/dL (H)). Liver Function Tests: No results for input(s): AST, ALT, ALKPHOS, BILITOT, PROT, ALBUMIN in the last 168 hours. No results for input(s): LIPASE, AMYLASE in the last 168 hours. No results for input(s): AMMONIA in the last 168 hours. Coagulation Profile: Recent Labs  Lab 04/28/21 0125  INR 1.2   Cardiac Enzymes: No results for input(s): CKTOTAL, CKMB, CKMBINDEX, TROPONINI in the last 168 hours. BNP (last 3 results) No results for input(s): PROBNP in the last 8760 hours. HbA1C: No results for input(s): HGBA1C in the last 72 hours. CBG: No results for input(s): GLUCAP in the last 168 hours. Lipid Profile: No results for input(s): CHOL, HDL, LDLCALC, TRIG, CHOLHDL, LDLDIRECT in the last 72 hours. Thyroid Function Tests: No results for input(s): TSH, T4TOTAL, FREET4, T3FREE, THYROIDAB in the last 72 hours. Anemia Panel: No results for input(s): VITAMINB12, FOLATE, FERRITIN, TIBC, IRON, RETICCTPCT  in the last 72 hours. Sepsis Labs: Recent Labs  Lab 04/27/21 1552 04/28/21 0357  PROCALCITON 1.29  --   LATICACIDVEN  --  0.8    Recent Results (from the past 240 hour(s))  Resp Panel by RT-PCR (Flu A&B, Covid) Nasopharyngeal Swab     Status: None   Collection Time: 04/28/21  1:25 AM   Specimen: Nasopharyngeal Swab; Nasopharyngeal(NP) swabs in vial transport medium  Result Value Ref Range Status   SARS Coronavirus 2 by RT PCR NEGATIVE NEGATIVE Final    Comment: (NOTE) SARS-CoV-2 target nucleic acids are NOT DETECTED.  The SARS-CoV-2 RNA is generally detectable in upper respiratory specimens during the acute phase of infection. The lowest concentration of SARS-CoV-2 viral copies this assay can detect is 138 copies/mL. A negative result does not preclude SARS-Cov-2 infection and should not be used as the sole basis for treatment or other patient management decisions. A negative result may occur with  improper specimen collection/handling, submission of specimen other than nasopharyngeal swab, presence of viral mutation(s) within the areas targeted by this assay, and inadequate number of viral copies(<138 copies/mL). A negative result must be combined with clinical observations, patient history, and epidemiological information. The expected result is Negative.  Fact Sheet for Patients:  EntrepreneurPulse.com.au  Fact Sheet for Healthcare Providers:  IncredibleEmployment.be  This test is no t yet approved or cleared by the [place] FDA and  has been authorized for detection and/or diagnosis of SARS-CoV-2 by FDA under an Emergency Use Authorization (EUA). This EUA will remain  in effect (meaning this test can be used) for the duration of the COVID-19 declaration under Section 564(b)(1) of the Act, 21 U.S.C.section 360bbb-3(b)(1), unless the authorization is terminated  or revoked sooner.       Influenza A by PCR NEGATIVE NEGATIVE  Final   Influenza B by PCR NEGATIVE NEGATIVE Final    Comment: (NOTE) The Xpert Xpress SARS-CoV-2/FLU/RSV plus assay is intended as an aid in the diagnosis of influenza from Nasopharyngeal swab specimens and should not be used as a sole basis for treatment. Nasal washings and aspirates are unacceptable for  Xpert Xpress SARS-CoV-2/FLU/RSV testing.  Fact Sheet for Patients: EntrepreneurPulse.com.au  Fact Sheet for Healthcare Providers: IncredibleEmployment.be  This test is not yet approved or cleared by the [place] FDA and has been authorized for detection and/or diagnosis of SARS-CoV-2 by FDA under an Emergency Use Authorization (EUA). This EUA will remain in effect (meaning this test can be used) for the duration of the COVID-19 declaration under Section 564(b)(1) of the Act, 21 U.S.C. section 360bbb-3(b)(1), unless the authorization is terminated or revoked.  Performed at [facility], [address] [place], [place] [address]   Blood Culture (routine x 2)     Status: None (Preliminary result)   Collection Time: 04/28/21  3:57 AM   Specimen: BLOOD  Result Value Ref Range Status   Specimen Description BLOOD LEFT FA  Final   Special Requests   Final    BOTTLES DRAWN AEROBIC AND ANAEROBIC Blood Culture adequate volume   Culture   Final    NO GROWTH 1 DAY Performed at [facility], [address], [place], [place] [address]    Report Status PENDING  Incomplete  Blood Culture (routine x 2)     Status: None (Preliminary result)   Collection Time: 04/28/21  3:57 AM   Specimen: BLOOD  Result Value Ref Range Status   Specimen Description BLOOD LEFT HAND  Final   Special Requests   Final    BOTTLES DRAWN AEROBIC AND ANAEROBIC Blood Culture adequate volume   Culture   Final    NO GROWTH 1 DAY Performed at [facility], [address] [place], [place] [address]    Report Status PENDING  Incomplete  MRSA  Next Gen by PCR, Nasal     Status: None   Collection Time: 04/28/21  7:52 AM   Specimen: Nasal Mucosa; Nasal Swab  Result Value Ref Range Status   MRSA by PCR Next Gen NOT DETECTED NOT DETECTED Final    Comment: (NOTE) The GeneXpert MRSA Assay (FDA approved for NASAL specimens only), is one component of a comprehensive MRSA colonization surveillance program. It is not intended to diagnose MRSA infection nor to guide or monitor treatment for MRSA infections. Test performance is not FDA approved in patients less than 27 years old. Performed at [facility], [address] [place], [place] [address]       Imaging Studies   DG Chest 2 View  Result Date: 04/27/2021 CLINICAL DATA:  Shortness of breath. EXAM: CHEST - 2 VIEW COMPARISON:  None. FINDINGS: Mild to moderate severity diffusely increased interstitial lung markings are seen. Mild to moderate severity atelectatic changes are noted within the bilateral lung bases and mid right lung. Elevation of the right hemidiaphragm is seen. There is no evidence of a pleural effusion or pneumothorax. Mild to moderate severity enlargement of the cardiac silhouette is seen with marked severity calcification of the aortic arch. Degenerative changes seen throughout the thoracic spine. IMPRESSION: 1. Mild to moderate severity interstitial edema with mild to moderate severity bilateral atelectasis. Electronically Signed   By: Virgina Norfolk M.D.   On: 04/27/2021 19:25   CT Chest Wo Contrast  Result Date: 04/28/2021 CLINICAL DATA:  Respiratory illness.  Shortness of breath. EXAM: CT CHEST WITHOUT CONTRAST TECHNIQUE: Multidetector CT imaging of the chest was performed following the standard protocol without IV contrast. COMPARISON:  Chest CT dated 10/02/2020. FINDINGS: Evaluation of this exam is limited in the absence of intravenous contrast. Cardiovascular: Mild cardiomegaly. No pericardial effusion. Advanced three-vessel coronary  vascular  calcification and calcification of the mitral annulus. There is moderate atherosclerotic calcification of the thoracic aorta. No aneurysmal dilatation. There is mild dilatation of main pulmonary trunk suggestive of pulmonary hypertension. Mediastinum/Nodes: No definite hilar adenopathy. Evaluation however is very limited in the absence of contrast and due to consolidative changes of the lungs. The esophagus is grossly unremarkable. No mediastinal fluid collection. Lungs/Pleura: Patchy areas of consolidation involving the right upper lobe, left lower lobe as well as patchy and streaky right lower lobe consolidation most consistent with pneumonia. Clinical correlation and follow-up to resolution recommended. No pleural effusion or pneumothorax. There is mucous secretion within the trachea and bilateral bronchi. The central airways however remain patent. Upper Abdomen: No acute findings. Similar appearance of the kidneys with parenchyma atrophy and hypodense lesions. 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 Degenerative changes of the spine. No acute osseous pathology. IMPRESSION: 1. Multifocal pneumonia. Clinical correlation and follow-up to resolution recommended. 2. Aortic Atherosclerosis (ICD10-I70.0). Electronically Signed   By: Anner Crete M.D.   On: 04/28/2021 02:06   ECHOCARDIOGRAM COMPLETE  Result Date: 04/28/2021    ECHOCARDIOGRAM REPORT   Patient Name:   Cassandra Joseph Date of Exam: 04/28/2021 Medical Rec #:  [id number]      Height:       66.0 in Accession #:    [id number]     Weight:       254.0 lb Date of Birth:  01-16-1946      BSA:          2.214 m Patient Age:    69 years       BP:           108/59 mmHg Patient Gender: F              HR:           69 bpm. Exam Location:  [facility] Procedure: 2D Echo, Cardiac Doppler and Color Doppler Indications:     CHF-acute diastolic G83.66  History:         Patient has no prior history of Echocardiogram examinations.                   CHF; Risk Factors:Hypertension.  Sonographer:     Sherrie Sport Referring Phys:  [id number] Athena Masse Diagnosing Phys: Kate Sable MD  Sonographer Comments: Suboptimal apical window. IMPRESSIONS  1. Left ventricular ejection fraction, by estimation, is 55 to 60%. The left ventricle has normal function. The left ventricle has no regional wall motion abnormalities. Left ventricular diastolic parameters are indeterminate.  2. Right ventricular systolic function is normal. The right ventricular size is not well visualized.  3. Left atrial size was severely dilated.  4. Right atrial size was moderately dilated.  5. The mitral valve is degenerative. Mild mitral valve regurgitation. Mild mitral stenosis. The mean mitral valve gradient is 4.0 mmHg. Moderate to severe mitral annular calcification.  6. The aortic valve is calcified. Aortic valve regurgitation is not visualized. Mild to moderate aortic valve sclerosis/calcification is present, without any evidence of aortic stenosis.  7. Aortic dilatation noted. There is mild-moderate dilatation of the aortic root, measuring 44 mm.  8. The inferior vena cava is normal in size with <50% respiratory variability, suggesting right atrial pressure of 8 mmHg. FINDINGS  Left Ventricle: Left ventricular ejection fraction, by estimation, is 55 to 60%. The left ventricle has normal function. The left ventricle has no regional wall motion abnormalities. The left ventricular internal cavity size  was normal in size. There is  no left ventricular hypertrophy. Left ventricular diastolic parameters are indeterminate. Right Ventricle: The right ventricular size is not well visualized. No increase in right ventricular wall thickness. Right ventricular systolic function is normal. Left Atrium: Left atrial size was severely dilated. Right Atrium: Right atrial size was moderately dilated. Pericardium: There is no evidence of pericardial effusion. Mitral Valve: The mitral valve is  degenerative in appearance. Moderate to severe mitral annular calcification. Mild mitral valve regurgitation. Mild mitral valve stenosis. MV peak gradient, 14.3 mmHg. The mean mitral valve gradient is 4.0 mmHg. Tricuspid Valve: The tricuspid valve is normal in structure. Tricuspid valve regurgitation is trivial. Aortic Valve: The aortic valve is calcified. Aortic valve regurgitation is not visualized. Mild to moderate aortic valve sclerosis/calcification is present, without any evidence of aortic stenosis. Aortic valve mean gradient measures 6.0 mmHg. Aortic valve peak gradient measures 11.7 mmHg. Aortic valve area, by VTI measures 1.95 cm. Pulmonic Valve: The pulmonic valve was not well visualized. Pulmonic valve regurgitation is trivial. Aorta: Aortic dilatation noted. There is mild-moderate dilatation of the aortic root, measuring 44 mm. Venous: The inferior vena cava is normal in size with less than 50% respiratory variability, suggesting right atrial pressure of 8 mmHg. IAS/Shunts: No atrial level shunt detected by color flow Doppler.  LEFT VENTRICLE PLAX 2D LVIDd:         3.70 cm  Diastology LVIDs:         2.50 cm  LV e' medial:   4.46 cm/s LV PW:         1.80 cm  LV E/e' medial: 34.5 LV IVS:        1.55 cm LVOT diam:     2.00 cm LV SV:         63 LV SV Index:   29 LVOT Area:     3.14 cm  RIGHT VENTRICLE RV Basal diam:  4.90 cm RV S prime:     13.40 cm/s TAPSE (M-mode): 4.7 cm LEFT ATRIUM              Index       RIGHT ATRIUM           Index LA diam:        6.60 cm  2.98 cm/m  RA Area:     25.00 cm LA Vol (A2C):   176.0 ml 79.51 ml/m RA Volume:   82.50 ml  37.27 ml/m LA Vol (A4C):   103.0 ml 46.53 ml/m LA Biplane Vol: 142.0 ml 64.15 ml/m  AORTIC VALVE                    PULMONIC VALVE AV Area (Vmax):    1.68 cm     PV Vmax:        0.89 m/s AV Area (Vmean):   1.83 cm     PV Peak grad:   3.1 mmHg AV Area (VTI):     1.95 cm     RVOT Peak grad: 5 mmHg AV Vmax:           171.00 cm/s AV Vmean:           112.333 cm/s AV VTI:            0.324 m AV Peak Grad:      11.7 mmHg AV Mean Grad:      6.0 mmHg LVOT Vmax:         91.40 cm/s LVOT Vmean:  65.600 cm/s LVOT VTI:          0.201 m LVOT/AV VTI ratio: 0.62  AORTA Ao Root diam: 3.77 cm MITRAL VALVE                TRICUSPID VALVE MV Area (PHT): 2.45 cm     TR Peak grad:   13.2 mmHg MV Area VTI:   1.51 cm     TR Vmax:        182.00 cm/s MV Peak grad:  14.3 mmHg MV Mean grad:  4.0 mmHg     SHUNTS MV Vmax:       1.89 m/s     Systemic VTI:  0.20 m MV Vmean:      91.9 cm/s    Systemic Diam: 2.00 cm MV Decel Time: 310 msec MV E velocity: 154.00 cm/s Kate Sable MD Electronically signed by Kate Sable MD Signature Date/Time: 04/28/2021/4:04:18 PM    Final      Medications   Scheduled Meds:  apixaban  2.5 mg Oral BID   atorvastatin  80 mg Oral Daily   Chlorhexidine Gluconate Cloth  6 each Topical Q0600   feeding supplement (NEPRO CARB STEADY)  237 mL Oral TID BM   fluticasone  1-2 spray Each Nare Q1200   gabapentin  300 mg Oral TID   hydroxychloroquine  200 mg Oral Daily   multivitamin  1 tablet Oral QHS   sevelamer carbonate  1,600 mg Oral 2 times per day   sevelamer carbonate  2,400 mg Oral TID WC   Continuous Infusions:  azithromycin 500 mg (04/29/21 0440)   cefTRIAXone (ROCEPHIN)  IV Stopped (04/29/21 0437)       LOS: 1 day    Time spent: 30 minutes with > 50% spent at bedside and in coordination of care     Ezekiel Slocumb, DO [facility]  04/29/2021, 4:13 PM      If 7PM-7AM, please contact night-coverage. How to contact the [facility] Attending or Consulting provider [place] or covering provider during after hours [place], for this patient?    Check the care team in [facility] and look for a) attending/consulting [facility] provider listed and b) the [facility] team listed Log into [email or web address] and use [place]'s universal password to access. If you do not have the password, please contact the hospital operator. Locate the [facility]  provider you are looking for under [facility] and page to a number that you can be directly reached. If you still have difficulty reaching the provider, please page the [facility] (Director on Call) for the Hospitalists listed on amion for assistance.

## 2021-04-29 NOTE — Progress Notes (Addendum)
[facility]  ROUNDING NOTE   Subjective:   Cassandra Joseph is a 75 y.o. female with past medical conditions including hypertension, anemia, multiple myeloma, chronic respiratory failure on home O2, and end stage renal disease on dialysis. Patient presents to ED from outpatient dialysis clinic with complaints of shortness of breath. Patient has been admitted for CHF (congestive heart failure) (HCC) [I50.9] ESRD on hemodialysis (HCC) [N18.6, Z99.2] Acute dyspnea [R06.00] Multifocal pneumonia [J18.9] Acute on chronic congestive heart failure, unspecified heart failure type (HCC) [I50.9]  Patient is known to our clinic from previous admission and receives outpatient dialysis at [facility], supervised by [facility] physicians.   Patient seen and evaluated during dialysis   HEMODIALYSIS FLOWSHEET:  Blood Flow Rate (mL/min): 400 mL/min Arterial Pressure (mmHg): -190 mmHg Venous Pressure (mmHg): 150 mmHg Transmembrane Pressure (mmHg): 70 mmHg Ultrafiltration Rate (mL/min): 670 mL/min Dialysate Flow Rate (mL/min): 500 ml/min Conductivity: Machine : 13.8 Conductivity: Machine : 13.8  Resting comfortable Reports continued weakness, but overall feels better No complaints at this time   Objective:  Vital signs in last 24 hours:  Temp:  [97.8 F (36.6 C)-98.3 F (36.8 C)] 97.8 F (36.6 C) (09/22 0936) Pulse Rate:  [67-90] 80 (09/22 1145) Resp:  [12-23] 21 (09/22 1145) BP: (95-139)/(48-81) 121/62 (09/22 1145) SpO2:  [90 %-100 %] 98 % (09/22 1145) Weight:  [112 kg] 112 kg (09/22 0227)  Weight change: -3.2 kg Filed Weights   04/28/21 0348 04/29/21 0227  Weight: 115.2 kg 112 kg    Intake/Output: I/O last 3 completed shifts: In: 524.6 [IV Piggyback:524.6] Out: -    Intake/Output this shift:  Total I/O In: 240 [P.O.:240] Out: -   Physical Exam: General: NAD, resting in bed  Head: Normocephalic, atraumatic. Moist oral mucosal membranes  Eyes: Anicteric  Lungs:   Clear to auscultation, normal effort [place] O2 3L  Heart: Regular rate and rhythm  Abdomen:  Soft, nontender  Extremities:  no peripheral edema.  Neurologic: Nonfocal, moving all four extremities  Skin: No lesions  Access: Rt AVF    Basic Metabolic Panel: Recent Labs  Lab 04/27/21 1552 04/29/21 0500  NA 136 136  K 3.5 4.0  CL 96* 96*  CO2 29 28  GLUCOSE 115* 77  BUN 28* 44*  CREATININE 3.93* 6.07*  CALCIUM 9.1 9.3  MG  --  1.9     Liver Function Tests: No results for input(s): AST, ALT, ALKPHOS, BILITOT, PROT, ALBUMIN in the last 168 hours. No results for input(s): LIPASE, AMYLASE in the last 168 hours. No results for input(s): AMMONIA in the last 168 hours.  CBC: Recent Labs  Lab 04/27/21 1552 04/29/21 0500  WBC 4.7 5.8  HGB 9.7* 10.0*  HCT 30.9* 32.0*  MCV 101.0* 103.2*  PLT 160 190     Cardiac Enzymes: No results for input(s): CKTOTAL, CKMB, CKMBINDEX, TROPONINI in the last 168 hours.  BNP: Invalid input(s): POCBNP  CBG: No results for input(s): GLUCAP in the last 168 hours.  Microbiology: Results for orders placed or performed during the hospital encounter of 04/28/21  Resp Panel by RT-PCR (Flu A&B, Covid) Nasopharyngeal Swab     Status: None   Collection Time: 04/28/21  1:25 AM   Specimen: Nasopharyngeal Swab; Nasopharyngeal(NP) swabs in vial transport medium  Result Value Ref Range Status   SARS Coronavirus 2 by RT PCR NEGATIVE NEGATIVE Final    Comment: (NOTE) SARS-CoV-2 target nucleic acids are NOT DETECTED.  The SARS-CoV-2 RNA is generally detectable in upper respiratory   specimens during the acute phase of infection. The lowest concentration of SARS-CoV-2 viral copies this assay can detect is 138 copies/mL. A negative result does not preclude SARS-Cov-2 infection and should not be used as the sole basis for treatment or other patient management decisions. A negative result may occur with  improper specimen collection/handling, submission of  specimen other than nasopharyngeal swab, presence of viral mutation(s) within the areas targeted by this assay, and inadequate number of viral copies(<138 copies/mL). A negative result must be combined with clinical observations, patient history, and epidemiological information. The expected result is Negative.  Fact Sheet for Patients:  https://www.fda.gov/media/152166/download  Fact Sheet for Healthcare Providers:  https://www.fda.gov/media/152162/download  This test is no t yet approved or cleared by the [place] FDA and  has been authorized for detection and/or diagnosis of SARS-CoV-2 by FDA under an Emergency Use Authorization (EUA). This EUA will remain  in effect (meaning this test can be used) for the duration of the COVID-19 declaration under Section 564(b)(1) of the Act, 21 U.S.C.section 360bbb-3(b)(1), unless the authorization is terminated  or revoked sooner.       Influenza A by PCR NEGATIVE NEGATIVE Final   Influenza B by PCR NEGATIVE NEGATIVE Final    Comment: (NOTE) The Xpert Xpress SARS-CoV-2/FLU/RSV plus assay is intended as an aid in the diagnosis of influenza from Nasopharyngeal swab specimens and should not be used as a sole basis for treatment. Nasal washings and aspirates are unacceptable for Xpert Xpress SARS-CoV-2/FLU/RSV testing.  Fact Sheet for Patients: https://www.fda.gov/media/152166/download  Fact Sheet for Healthcare Providers: https://www.fda.gov/media/152162/download  This test is not yet approved or cleared by the [place] FDA and has been authorized for detection and/or diagnosis of SARS-CoV-2 by FDA under an Emergency Use Authorization (EUA). This EUA will remain in effect (meaning this test can be used) for the duration of the COVID-19 declaration under Section 564(b)(1) of the Act, 21 U.S.C. section 360bbb-3(b)(1), unless the authorization is terminated or revoked.  Performed at [facility], [address] [place], [place] [address]   Blood Culture (routine x 2)     Status: None (Preliminary result)   Collection Time: 04/28/21  3:57 AM   Specimen: BLOOD  Result Value Ref Range Status   Specimen Description BLOOD LEFT FA  Final   Special Requests   Final    BOTTLES DRAWN AEROBIC AND ANAEROBIC Blood Culture adequate volume   Culture   Final    NO GROWTH 1 DAY Performed at [facility], [address] [place], [place] [address]    Report Status PENDING  Incomplete  Blood Culture (routine x 2)     Status: None (Preliminary result)   Collection Time: 04/28/21  3:57 AM   Specimen: BLOOD  Result Value Ref Range Status   Specimen Description BLOOD LEFT HAND  Final   Special Requests   Final    BOTTLES DRAWN AEROBIC AND ANAEROBIC Blood Culture adequate volume   Culture   Final    NO GROWTH 1 DAY Performed at [facility], [address] [place], [place] [address]    Report Status PENDING  Incomplete  MRSA Next Gen by PCR, Nasal     Status: None   Collection Time: 04/28/21  7:52 AM   Specimen: Nasal Mucosa; Nasal Swab  Result Value Ref Range Status   MRSA by PCR Next Gen NOT DETECTED NOT DETECTED Final    Comment: (NOTE) The GeneXpert MRSA Assay (FDA approved for NASAL specimens only), is   one component of a comprehensive MRSA colonization surveillance program. It is not intended to diagnose MRSA infection nor to guide or monitor treatment for MRSA infections. Test performance is not FDA approved in patients less than 2 years old. Performed at [facility], [address] [place], [place] [address]     Coagulation Studies: Recent Labs    04/28/21 0125  LABPROT 15.0  INR 1.2     Urinalysis: No results for input(s): COLORURINE, LABSPEC, PHURINE, GLUCOSEU, HGBUR, BILIRUBINUR, KETONESUR, PROTEINUR, UROBILINOGEN, NITRITE, LEUKOCYTESUR in the last 72 hours.  Invalid input(s): APPERANCEUR    Imaging: DG Chest 2 View  Result Date:  04/27/2021 CLINICAL DATA:  Shortness of breath. EXAM: CHEST - 2 VIEW COMPARISON:  None. FINDINGS: Mild to moderate severity diffusely increased interstitial lung markings are seen. Mild to moderate severity atelectatic changes are noted within the bilateral lung bases and mid right lung. Elevation of the right hemidiaphragm is seen. There is no evidence of a pleural effusion or pneumothorax. Mild to moderate severity enlargement of the cardiac silhouette is seen with marked severity calcification of the aortic arch. Degenerative changes seen throughout the thoracic spine. IMPRESSION: 1. Mild to moderate severity interstitial edema with mild to moderate severity bilateral atelectasis. Electronically Signed   By: Thaddeus  Houston M.D.   On: 04/27/2021 19:25   CT Chest Wo Contrast  Result Date: 04/28/2021 CLINICAL DATA:  Respiratory illness.  Shortness of breath. EXAM: CT CHEST WITHOUT CONTRAST TECHNIQUE: Multidetector CT imaging of the chest was performed following the standard protocol without IV contrast. COMPARISON:  Chest CT dated 10/02/2020. FINDINGS: Evaluation of this exam is limited in the absence of intravenous contrast. Cardiovascular: Mild cardiomegaly. No pericardial effusion. Advanced three-vessel coronary vascular calcification and calcification of the mitral annulus. There is moderate atherosclerotic calcification of the thoracic aorta. No aneurysmal dilatation. There is mild dilatation of main pulmonary trunk suggestive of pulmonary hypertension. Mediastinum/Nodes: No definite hilar adenopathy. Evaluation however is very limited in the absence of contrast and due to consolidative changes of the lungs. The esophagus is grossly unremarkable. No mediastinal fluid collection. Lungs/Pleura: Patchy areas of consolidation involving the right upper lobe, left lower lobe as well as patchy and streaky right lower lobe consolidation most consistent with pneumonia. Clinical correlation and follow-up to  resolution recommended. No pleural effusion or pneumothorax. There is mucous secretion within the trachea and bilateral bronchi. The central airways however remain patent. Upper Abdomen: No acute findings. Similar appearance of the kidneys with parenchyma atrophy and hypodense lesions. 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 Degenerative changes of the spine. No acute osseous pathology. IMPRESSION: 1. Multifocal pneumonia. Clinical correlation and follow-up to resolution recommended. 2. Aortic Atherosclerosis (ICD10-I70.0). Electronically Signed   By: Arash  Radparvar M.D.   On: 04/28/2021 02:06   ECHOCARDIOGRAM COMPLETE  Result Date: 04/28/2021    ECHOCARDIOGRAM REPORT   Patient Name:   Cassandra Joseph Date of Exam: 04/28/2021 Medical Rec #:  [id number]      Height:       66.0 in Accession #:    [id number]     Weight:       254.0 lb Date of Birth:  02/20/1946      BSA:          2.214 m Patient Age:    75 years       BP:           108/59 mmHg Patient Gender: F                HR:           69 bpm. Exam Location:  [facility] Procedure: 2D Echo, Cardiac Doppler and Color Doppler Indications:     CHF-acute diastolic I50.31  History:         Patient has no prior history of Echocardiogram examinations.                  CHF; Risk Factors:Hypertension.  Sonographer:     Jerry Hege Referring Phys:  [id number] HAZEL V DUNCAN Diagnosing Phys: Brian Agbor-Etang MD  Sonographer Comments: Suboptimal apical window. IMPRESSIONS  1. Left ventricular ejection fraction, by estimation, is 55 to 60%. The left ventricle has normal function. The left ventricle has no regional wall motion abnormalities. Left ventricular diastolic parameters are indeterminate.  2. Right ventricular systolic function is normal. The right ventricular size is not well visualized.  3. Left atrial size was severely dilated.  4. Right atrial size was moderately dilated.  5. The mitral valve is degenerative. Mild mitral valve  regurgitation. Mild mitral stenosis. The mean mitral valve gradient is 4.0 mmHg. Moderate to severe mitral annular calcification.  6. The aortic valve is calcified. Aortic valve regurgitation is not visualized. Mild to moderate aortic valve sclerosis/calcification is present, without any evidence of aortic stenosis.  7. Aortic dilatation noted. There is mild-moderate dilatation of the aortic root, measuring 44 mm.  8. The inferior vena cava is normal in size with <50% respiratory variability, suggesting right atrial pressure of 8 mmHg. FINDINGS  Left Ventricle: Left ventricular ejection fraction, by estimation, is 55 to 60%. The left ventricle has normal function. The left ventricle has no regional wall motion abnormalities. The left ventricular internal cavity size was normal in size. There is  no left ventricular hypertrophy. Left ventricular diastolic parameters are indeterminate. Right Ventricle: The right ventricular size is not well visualized. No increase in right ventricular wall thickness. Right ventricular systolic function is normal. Left Atrium: Left atrial size was severely dilated. Right Atrium: Right atrial size was moderately dilated. Pericardium: There is no evidence of pericardial effusion. Mitral Valve: The mitral valve is degenerative in appearance. Moderate to severe mitral annular calcification. Mild mitral valve regurgitation. Mild mitral valve stenosis. MV peak gradient, 14.3 mmHg. The mean mitral valve gradient is 4.0 mmHg. Tricuspid Valve: The tricuspid valve is normal in structure. Tricuspid valve regurgitation is trivial. Aortic Valve: The aortic valve is calcified. Aortic valve regurgitation is not visualized. Mild to moderate aortic valve sclerosis/calcification is present, without any evidence of aortic stenosis. Aortic valve mean gradient measures 6.0 mmHg. Aortic valve peak gradient measures 11.7 mmHg. Aortic valve area, by VTI measures 1.95 cm. Pulmonic Valve: The pulmonic valve  was not well visualized. Pulmonic valve regurgitation is trivial. Aorta: Aortic dilatation noted. There is mild-moderate dilatation of the aortic root, measuring 44 mm. Venous: The inferior vena cava is normal in size with less than 50% respiratory variability, suggesting right atrial pressure of 8 mmHg. IAS/Shunts: No atrial level shunt detected by color flow Doppler.  LEFT VENTRICLE PLAX 2D LVIDd:         3.70 cm  Diastology LVIDs:         2.50 cm  LV e' medial:   4.46 cm/s LV PW:         1.80 cm  LV E/e' medial: 34.5 LV IVS:        1.55 cm LVOT diam:     2.00 cm LV SV:         63   LV SV Index:   29 LVOT Area:     3.14 cm  RIGHT VENTRICLE RV Basal diam:  4.90 cm RV S prime:     13.40 cm/s TAPSE (M-mode): 4.7 cm LEFT ATRIUM              Index       RIGHT ATRIUM           Index LA diam:        6.60 cm  2.98 cm/m  RA Area:     25.00 cm LA Vol (A2C):   176.0 ml 79.51 ml/m RA Volume:   82.50 ml  37.27 ml/m LA Vol (A4C):   103.0 ml 46.53 ml/m LA Biplane Vol: 142.0 ml 64.15 ml/m  AORTIC VALVE                    PULMONIC VALVE AV Area (Vmax):    1.68 cm     PV Vmax:        0.89 m/s AV Area (Vmean):   1.83 cm     PV Peak grad:   3.1 mmHg AV Area (VTI):     1.95 cm     RVOT Peak grad: 5 mmHg AV Vmax:           171.00 cm/s AV Vmean:          112.333 cm/s AV VTI:            0.324 m AV Peak Grad:      11.7 mmHg AV Mean Grad:      6.0 mmHg LVOT Vmax:         91.40 cm/s LVOT Vmean:        65.600 cm/s LVOT VTI:          0.201 m LVOT/AV VTI ratio: 0.62  AORTA Ao Root diam: 3.77 cm MITRAL VALVE                TRICUSPID VALVE MV Area (PHT): 2.45 cm     TR Peak grad:   13.2 mmHg MV Area VTI:   1.51 cm     TR Vmax:        182.00 cm/s MV Peak grad:  14.3 mmHg MV Mean grad:  4.0 mmHg     SHUNTS MV Vmax:       1.89 m/s     Systemic VTI:  0.20 m MV Vmean:      91.9 cm/s    Systemic Diam: 2.00 cm MV Decel Time: 310 msec MV E velocity: 154.00 cm/s Kate Sable MD Electronically signed by Kate Sable MD Signature  Date/Time: 04/28/2021/4:04:18 PM    Final      Medications:    sodium chloride     sodium chloride     azithromycin 500 mg (04/29/21 0440)   cefTRIAXone (ROCEPHIN)  IV Stopped (04/29/21 0437)    apixaban  2.5 mg Oral BID   atorvastatin  80 mg Oral Daily   Chlorhexidine Gluconate Cloth  6 each Topical Q0600   feeding supplement (NEPRO CARB STEADY)  237 mL Oral TID BM   fluticasone  1-2 spray Each Nare Q1200   gabapentin  300 mg Oral TID   hydroxychloroquine  200 mg Oral Daily   multivitamin  1 tablet Oral QHS   sevelamer carbonate  1,600 mg Oral 2 times per day   sevelamer carbonate  2,400 mg Oral TID WC   sodium chloride, sodium chloride, acetaminophen **OR** acetaminophen, alteplase, heparin, HYDROcodone-acetaminophen, lactulose,  lidocaine (PF), lidocaine-prilocaine, ondansetron **OR** ondansetron (ZOFRAN) IV, pentafluoroprop-tetrafluoroeth  Assessment/ Plan:  Ms. Tamina Cyphers is a 75 y.o.  female with past medical conditions including hypertension, anemia, multiple myeloma, chronic respiratory failure on home O2, and end stage renal disease on dialysis. Patient presents to ED from outpatient dialysis clinic with complaints of shortness of breath. Patient has been admitted for CHF (congestive heart failure) ([place]) [I50.9] ESRD on hemodialysis ([place]) [N18.6, Z99.2] Acute dyspnea [R06.00] Multifocal pneumonia [J18.9] Acute on chronic congestive heart failure, unspecified heart failure type ([place]) [I50.9]  [facility]/TTS/Lt AVF  End stage renal disease on dialysis: will maintain schedule during this admission, if possible. Last treatment on 04/26/21 at outpatient center. Receiving dialysis today, tolerating well. Next treatment scheduled for Saturday  Anemia of chronic kidney disease Lab Results  Component Value Date   HGB 10.0 (L) 04/29/2021  Hgb at target  Secondary Hyperparathyroidism:  Lab Results  Component Value Date   CALCIUM 9.3 04/29/2021  Calcium at  target  4.Hypertension with chronic kidney disease   Home regimen includes amlodipine, carvedilol, and midodrine  5. Multifocal pneumonia Receiving azithromycin and vancomycin Continue supportive care   LOS: 1 [place] 9/22/202212:16 PM

## 2021-04-30 DIAGNOSIS — J189 Pneumonia, unspecified organism: Secondary | ICD-10-CM | POA: Diagnosis not present

## 2021-04-30 LAB — BASIC METABOLIC PANEL
Anion gap: 10 (ref 5–15)
BUN: 30 mg/dL — ABNORMAL HIGH (ref 8–23)
CO2: 29 mmol/L (ref 22–32)
Calcium: 9.2 mg/dL (ref 8.9–10.3)
Chloride: 96 mmol/L — ABNORMAL LOW (ref 98–111)
Creatinine, Ser: 4.65 mg/dL — ABNORMAL HIGH (ref 0.44–1.00)
GFR, Estimated: 9 mL/min — ABNORMAL LOW (ref >60)
Glucose, Bld: 88 mg/dL (ref 70–99)
Potassium: 3.4 mmol/L — ABNORMAL LOW (ref 3.5–5.1)
Sodium: 135 mmol/L (ref 135–145)

## 2021-04-30 LAB — CBC
HCT: 29.1 % — ABNORMAL LOW (ref 36.0–46.0)
Hemoglobin: 9.3 g/dL — ABNORMAL LOW (ref 12.0–15.0)
MCH: 32.5 pg (ref 26.0–34.0)
MCHC: 32 g/dL (ref 30.0–36.0)
MCV: 101.7 fL — ABNORMAL HIGH (ref 80.0–100.0)
Platelets: 183 10*3/uL (ref 150–400)
RBC: 2.86 MIL/uL — ABNORMAL LOW (ref 3.87–5.11)
RDW: 14.7 % (ref 11.5–15.5)
WBC: 5.7 10*3/uL (ref 4.0–10.5)
nRBC: 0 % (ref 0.0–0.2)

## 2021-04-30 LAB — HEPATITIS B SURFACE ANTIBODY, QUANTITATIVE: Hep B S AB Quant (Post): 28.5 m[IU]/mL (ref >9.9)

## 2021-04-30 MED ORDER — AZITHROMYCIN 250 MG PO TABS
500.0000 mg | ORAL_TABLET | Freq: Every day | ORAL | Status: AC
  Administered 2021-04-30 – 2021-05-01 (×2): 500 mg via ORAL
  Filled 2021-04-30 (×2): qty 2

## 2021-04-30 MED ORDER — CEFDINIR 300 MG PO CAPS
300.0000 mg | ORAL_CAPSULE | Freq: Two times a day (BID) | ORAL | Status: DC
  Administered 2021-05-01: 300 mg via ORAL
  Filled 2021-04-30 (×2): qty 1

## 2021-04-30 MED ORDER — POTASSIUM CHLORIDE CRYS ER 20 MEQ PO TBCR
40.0000 meq | EXTENDED_RELEASE_TABLET | Freq: Once | ORAL | Status: AC
  Administered 2021-04-30: 40 meq via ORAL
  Filled 2021-04-30: qty 4

## 2021-04-30 NOTE — Care Management Important Message (Signed)
Important Message  Patient Details  Name: Cassandra Joseph MRN: [id number] Date of Birth: [birth year]   Medicare Important Message Given:  Yes     Dannette Barbara 04/30/2021, 1:37 PM

## 2021-04-30 NOTE — Progress Notes (Signed)
[facility]  ROUNDING NOTE   Subjective:   Cassandra Joseph is a 75 y.o. female with past medical conditions including hypertension, anemia, multiple myeloma, chronic respiratory failure on home O2, and end stage renal disease on dialysis. Patient presents to ED from outpatient dialysis clinic with complaints of shortness of breath. Patient has been admitted for CHF (congestive heart failure) ([place]) [I50.9] ESRD on hemodialysis ([place]) [N18.6, Z99.2] Acute dyspnea [R06.00] Multifocal pneumonia [J18.9] Acute on chronic congestive heart failure, unspecified heart failure type [facility]) [I50.9]  Patient is known to our clinic from previous admission and receives outpatient dialysis at [facility], supervised by [facility] physicians.   Patient resting in bed Cpap on at this time Patient seen sitting up in bed Eating breakfast States she feels well today   Objective:  Vital signs in last 24 hours:  Temp:  [97.4 F (36.3 C)-99.2 F (37.3 C)] 98 F (36.7 C) (09/23 0807) Pulse Rate:  [73-86] 81 (09/23 0807) Resp:  [8-21] 15 (09/23 0807) BP: (111-151)/(49-71) 117/62 (09/23 0807) SpO2:  [94 %-100 %] 100 % (09/23 0807) Weight:  [112.7 kg] 112.7 kg (09/23 0454)  Weight change: 0.7 kg Filed Weights   04/28/21 0348 04/29/21 0227 04/30/21 0454  Weight: 115.2 kg 112 kg 112.7 kg    Intake/Output: I/O last 3 completed shifts: In: 740 [P.O.:240; IV Piggyback:500] Out: 1503 [BTYOM:6004]   Intake/Output this shift:  No intake/output data recorded.  Physical Exam: General: NAD, resting in bed  Head: Normocephalic, atraumatic. Moist oral mucosal membranes  Eyes: Anicteric  Lungs:  Clear to auscultation, normal effort [place] O2 3L  Heart: Regular rate and rhythm  Abdomen:  Soft, nontender  Extremities:  no peripheral edema.  Neurologic: Nonfocal, moving all four extremities  Skin: No lesions  Access: Rt AVF    Basic Metabolic Panel: Recent Labs  Lab 04/27/21 1552 04/29/21 0500  04/30/21 0455  NA 136 136 135  K 3.5 4.0 3.4*  CL 96* 96* 96*  CO2 29 28 29   GLUCOSE 115* 77 88  BUN 28* 44* 30*  CREATININE 3.93* 6.07* 4.65*  CALCIUM 9.1 9.3 9.2  MG  --  1.9  --      Liver Function Tests: No results for input(s): AST, ALT, ALKPHOS, BILITOT, PROT, ALBUMIN in the last 168 hours. No results for input(s): LIPASE, AMYLASE in the last 168 hours. No results for input(s): AMMONIA in the last 168 hours.  CBC: Recent Labs  Lab 04/27/21 1552 04/29/21 0500 04/30/21 0455  WBC 4.7 5.8 5.7  HGB 9.7* 10.0* 9.3*  HCT 30.9* 32.0* 29.1*  MCV 101.0* 103.2* 101.7*  PLT 160 190 183     Cardiac Enzymes: No results for input(s): CKTOTAL, CKMB, CKMBINDEX, TROPONINI in the last 168 hours.  BNP: Invalid input(s): POCBNP  CBG: No results for input(s): GLUCAP in the last 168 hours.  Microbiology: Results for orders placed or performed during the hospital encounter of 04/28/21  Resp Panel by RT-PCR (Flu A&B, Covid) Nasopharyngeal Swab     Status: None   Collection Time: 04/28/21  1:25 AM   Specimen: Nasopharyngeal Swab; Nasopharyngeal(NP) swabs in vial transport medium  Result Value Ref Range Status   SARS Coronavirus 2 by RT PCR NEGATIVE NEGATIVE Final    Comment: (NOTE) SARS-CoV-2 target nucleic acids are NOT DETECTED.  The SARS-CoV-2 RNA is generally detectable in upper respiratory specimens during the acute phase of infection. The lowest concentration of SARS-CoV-2 viral copies this assay can detect is 138 copies/mL. A negative  result does not preclude SARS-Cov-2 infection and should not be used as the sole basis for treatment or other patient management decisions. A negative result may occur with  improper specimen collection/handling, submission of specimen other than nasopharyngeal swab, presence of viral mutation(s) within the areas targeted by this assay, and inadequate number of viral copies(<138 copies/mL). A negative result must be combined  with clinical observations, patient history, and epidemiological information. The expected result is Negative.  Fact Sheet for Patients:  EntrepreneurPulse.com.au  Fact Sheet for Healthcare Providers:  IncredibleEmployment.be  This test is no t yet approved or cleared by the [place] FDA and  has been authorized for detection and/or diagnosis of SARS-CoV-2 by FDA under an Emergency Use Authorization (EUA). This EUA will remain  in effect (meaning this test can be used) for the duration of the COVID-19 declaration under Section 564(b)(1) of the Act, 21 U.S.C.section 360bbb-3(b)(1), unless the authorization is terminated  or revoked sooner.       Influenza A by PCR NEGATIVE NEGATIVE Final   Influenza B by PCR NEGATIVE NEGATIVE Final    Comment: (NOTE) The Xpert Xpress SARS-CoV-2/FLU/RSV plus assay is intended as an aid in the diagnosis of influenza from Nasopharyngeal swab specimens and should not be used as a sole basis for treatment. Nasal washings and aspirates are unacceptable for Xpert Xpress SARS-CoV-2/FLU/RSV testing.  Fact Sheet for Patients: EntrepreneurPulse.com.au  Fact Sheet for Healthcare Providers: IncredibleEmployment.be  This test is not yet approved or cleared by the [place] FDA and has been authorized for detection and/or diagnosis of SARS-CoV-2 by FDA under an Emergency Use Authorization (EUA). This EUA will remain in effect (meaning this test can be used) for the duration of the COVID-19 declaration under Section 564(b)(1) of the Act, 21 U.S.C. section 360bbb-3(b)(1), unless the authorization is terminated or revoked.  Performed at [facility], [address] [place], [place] [address]   Blood Culture (routine x 2)     Status: None (Preliminary result)   Collection Time: 04/28/21  3:57 AM   Specimen: BLOOD  Result Value Ref Range Status   Specimen  Description BLOOD LEFT FA  Final   Special Requests   Final    BOTTLES DRAWN AEROBIC AND ANAEROBIC Blood Culture adequate volume   Culture   Final    NO GROWTH 2 DAYS Performed at [facility], [address], [place], [place] [address]    Report Status PENDING  Incomplete  Blood Culture (routine x 2)     Status: None (Preliminary result)   Collection Time: 04/28/21  3:57 AM   Specimen: BLOOD  Result Value Ref Range Status   Specimen Description BLOOD LEFT HAND  Final   Special Requests   Final    BOTTLES DRAWN AEROBIC AND ANAEROBIC Blood Culture adequate volume   Culture   Final    NO GROWTH 2 DAYS Performed at [facility], [address] [place], [place] [address]    Report Status PENDING  Incomplete  MRSA Next Gen by PCR, Nasal     Status: None   Collection Time: 04/28/21  7:52 AM   Specimen: Nasal Mucosa; Nasal Swab  Result Value Ref Range Status   MRSA by PCR Next Gen NOT DETECTED NOT DETECTED Final    Comment: (NOTE) The GeneXpert MRSA Assay (FDA approved for NASAL specimens only), is one component of a comprehensive MRSA colonization surveillance program. It is not intended to diagnose MRSA infection nor to guide or monitor treatment  for MRSA infections. Test performance is not FDA approved in patients less than 48 years old. Performed at [facility], [address] [place], [place] [address]     Coagulation Studies: Recent Labs    04/28/21 0125  LABPROT 15.0  INR 1.2     Urinalysis: No results for input(s): COLORURINE, LABSPEC, PHURINE, GLUCOSEU, HGBUR, BILIRUBINUR, KETONESUR, PROTEINUR, UROBILINOGEN, NITRITE, LEUKOCYTESUR in the last 72 hours.  Invalid input(s): APPERANCEUR    Imaging: ECHOCARDIOGRAM COMPLETE  Result Date: 04/28/2021    ECHOCARDIOGRAM REPORT   Patient Name:   HERMELINDA DIEGEL Date of Exam: 04/28/2021 Medical Rec #:  [id number]      Height:       66.0 in Accession #:    [id number]     Weight:       254.0 lb  Date of Birth:  06/16/1946      BSA:          2.214 m Patient Age:    29 years       BP:           108/59 mmHg Patient Gender: F              HR:           69 bpm. Exam Location:  [facility] Procedure: 2D Echo, Cardiac Doppler and Color Doppler Indications:     CHF-acute diastolic Y48.25  History:         Patient has no prior history of Echocardiogram examinations.                  CHF; Risk Factors:Hypertension.  Sonographer:     Sherrie Sport Referring Phys:  [id number] Athena Masse Diagnosing Phys: Kate Sable MD  Sonographer Comments: Suboptimal apical window. IMPRESSIONS  1. Left ventricular ejection fraction, by estimation, is 55 to 60%. The left ventricle has normal function. The left ventricle has no regional wall motion abnormalities. Left ventricular diastolic parameters are indeterminate.  2. Right ventricular systolic function is normal. The right ventricular size is not well visualized.  3. Left atrial size was severely dilated.  4. Right atrial size was moderately dilated.  5. The mitral valve is degenerative. Mild mitral valve regurgitation. Mild mitral stenosis. The mean mitral valve gradient is 4.0 mmHg. Moderate to severe mitral annular calcification.  6. The aortic valve is calcified. Aortic valve regurgitation is not visualized. Mild to moderate aortic valve sclerosis/calcification is present, without any evidence of aortic stenosis.  7. Aortic dilatation noted. There is mild-moderate dilatation of the aortic root, measuring 44 mm.  8. The inferior vena cava is normal in size with <50% respiratory variability, suggesting right atrial pressure of 8 mmHg. FINDINGS  Left Ventricle: Left ventricular ejection fraction, by estimation, is 55 to 60%. The left ventricle has normal function. The left ventricle has no regional wall motion abnormalities. The left ventricular internal cavity size was normal in size. There is  no left ventricular hypertrophy. Left ventricular diastolic parameters are  indeterminate. Right Ventricle: The right ventricular size is not well visualized. No increase in right ventricular wall thickness. Right ventricular systolic function is normal. Left Atrium: Left atrial size was severely dilated. Right Atrium: Right atrial size was moderately dilated. Pericardium: There is no evidence of pericardial effusion. Mitral Valve: The mitral valve is degenerative in appearance. Moderate to severe mitral annular calcification. Mild mitral valve regurgitation. Mild mitral valve stenosis. MV peak gradient, 14.3 mmHg. The mean mitral valve gradient is 4.0 mmHg. Tricuspid Valve: The  tricuspid valve is normal in structure. Tricuspid valve regurgitation is trivial. Aortic Valve: The aortic valve is calcified. Aortic valve regurgitation is not visualized. Mild to moderate aortic valve sclerosis/calcification is present, without any evidence of aortic stenosis. Aortic valve mean gradient measures 6.0 mmHg. Aortic valve peak gradient measures 11.7 mmHg. Aortic valve area, by VTI measures 1.95 cm. Pulmonic Valve: The pulmonic valve was not well visualized. Pulmonic valve regurgitation is trivial. Aorta: Aortic dilatation noted. There is mild-moderate dilatation of the aortic root, measuring 44 mm. Venous: The inferior vena cava is normal in size with less than 50% respiratory variability, suggesting right atrial pressure of 8 mmHg. IAS/Shunts: No atrial level shunt detected by color flow Doppler.  LEFT VENTRICLE PLAX 2D LVIDd:         3.70 cm  Diastology LVIDs:         2.50 cm  LV e' medial:   4.46 cm/s LV PW:         1.80 cm  LV E/e' medial: 34.5 LV IVS:        1.55 cm LVOT diam:     2.00 cm LV SV:         63 LV SV Index:   29 LVOT Area:     3.14 cm  RIGHT VENTRICLE RV Basal diam:  4.90 cm RV S prime:     13.40 cm/s TAPSE (M-mode): 4.7 cm LEFT ATRIUM              Index       RIGHT ATRIUM           Index LA diam:        6.60 cm  2.98 cm/m  RA Area:     25.00 cm LA Vol (A2C):   176.0 ml 79.51  ml/m RA Volume:   82.50 ml  37.27 ml/m LA Vol (A4C):   103.0 ml 46.53 ml/m LA Biplane Vol: 142.0 ml 64.15 ml/m  AORTIC VALVE                    PULMONIC VALVE AV Area (Vmax):    1.68 cm     PV Vmax:        0.89 m/s AV Area (Vmean):   1.83 cm     PV Peak grad:   3.1 mmHg AV Area (VTI):     1.95 cm     RVOT Peak grad: 5 mmHg AV Vmax:           171.00 cm/s AV Vmean:          112.333 cm/s AV VTI:            0.324 m AV Peak Grad:      11.7 mmHg AV Mean Grad:      6.0 mmHg LVOT Vmax:         91.40 cm/s LVOT Vmean:        65.600 cm/s LVOT VTI:          0.201 m LVOT/AV VTI ratio: 0.62  AORTA Ao Root diam: 3.77 cm MITRAL VALVE                TRICUSPID VALVE MV Area (PHT): 2.45 cm     TR Peak grad:   13.2 mmHg MV Area VTI:   1.51 cm     TR Vmax:        182.00 cm/s MV Peak grad:  14.3 mmHg MV Mean grad:  4.0 mmHg     SHUNTS  MV Vmax:       1.89 m/s     Systemic VTI:  0.20 m MV Vmean:      91.9 cm/s    Systemic Diam: 2.00 cm MV Decel Time: 310 msec MV E velocity: 154.00 cm/s Kate Sable MD Electronically signed by Kate Sable MD Signature Date/Time: 04/28/2021/4:04:18 PM    Final      Medications:    azithromycin Stopped (04/30/21 2248)   cefTRIAXone (ROCEPHIN)  IV Stopped (04/30/21 0403)    apixaban  2.5 mg Oral BID   atorvastatin  80 mg Oral Daily   Chlorhexidine Gluconate Cloth  6 each Topical Q0600   feeding supplement (NEPRO CARB STEADY)  237 mL Oral TID BM   fluticasone  1-2 spray Each Nare Q1200   gabapentin  300 mg Oral TID   hydroxychloroquine  200 mg Oral Daily   multivitamin  1 tablet Oral QHS   sevelamer carbonate  1,600 mg Oral 2 times per day   sevelamer carbonate  2,400 mg Oral TID WC   acetaminophen **OR** acetaminophen, HYDROcodone-acetaminophen, lactulose, ondansetron **OR** ondansetron (ZOFRAN) IV  Assessment/ Plan:  Cassandra Joseph is a 75 y.o.  female with past medical conditions including hypertension, anemia, multiple myeloma, chronic respiratory failure on home  O2, and end stage renal disease on dialysis. Patient presents to ED from outpatient dialysis clinic with complaints of shortness of breath. Patient has been admitted for CHF (congestive heart failure) ([place]) [I50.9] ESRD on hemodialysis ([place]) [N18.6, Z99.2] Acute dyspnea [R06.00] Multifocal pneumonia [J18.9] Acute on chronic congestive heart failure, unspecified heart failure type ([place]) [I50.9]  [facility]/TTS/Lt AVF  End stage renal disease on dialysis: will maintain schedule during this admission, if possible. Last treatment on 04/26/21 at outpatient center. Received dialysis yesterday, UF1.5L achieved  Patient voiced concerns of lack of transportation for Saturday treatments. Notified dialysis coordinator of transportation concerns.  Next treatment scheduled for Saturday.  Anemia of chronic kidney disease Lab Results  Component Value Date   HGB 9.3 (L) 04/30/2021  Hgb at target  Secondary Hyperparathyroidism:  Lab Results  Component Value Date   CALCIUM 9.2 04/30/2021  Calcium at target  4.Hypertension with chronic kidney disease   Home regimen includes amlodipine, carvedilol, and midodrine  5. Multifocal pneumonia Receiving azithromycin and vancomycin Supportive care   LOS: 2 Leslee Suire 9/23/202211:39 AM

## 2021-04-30 NOTE — Evaluation (Signed)
Occupational Therapy Evaluation Patient Details Name: Cassandra Joseph MRN: [id number] DOB: 04-22-1946 Today's Date: 04/30/2021   History of Present Illness 75 year old female with past medical history of ESRD on dialysis TTS, anemia of chronic disease, paroxysmal A. 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 Pt is pleasant and cooperative. Pt reports living at home alone but has daughter and other family that are there almost 24/7. Pt sleeps in hospital bed and use of hoyer lift to transfer her from bed <>manual wheelchair. Pt has an aide 3x/wk for 3 hours that assists pt with self care tasks from bed level. Pt does demonstrate ability to perform supine <>sit with min - mod A and pt reports this is her baseline. Pt sitting EOB without assistance for balance and returns to bed with mod A for B LEs. Pt currently at baseline and does not need skilled OT intervention at this time. OT to SIGN OFF.      Recommendations for follow up therapy are one component of a multi-disciplinary discharge planning process, led by the attending physician.  Recommendations may be updated based on patient status, additional functional criteria and insurance authorization.   Follow Up Recommendations  No OT follow up;Supervision/Assistance - 24 hour    Equipment Recommendations  Other (comment) (Pt reports needing new hoyer sling for home use)       Precautions / Restrictions Precautions Precautions: Fall      Mobility Bed Mobility Overal bed mobility: Needs Assistance Bed Mobility: Supine to Sit;Sit to Supine      Supine to sit: Min assist Sit to supine: Mod assist   General bed mobility comments: min A for trunk support to EOB and needing assist with B LEs to return safely to bed    Transfers                 General transfer comment: not attempted as pt uses hoyer at home    Balance Overall balance assessment: Needs assistance Sitting-balance support: Feet supported;Bilateral upper extremity supported Sitting balance-Leahy Scale: Good                                     ADL either performed or assessed with clinical judgement   ADL Overall ADL's : At baseline                                       General ADL Comments: Pt needs assist from family and aide at baseline     Vision Patient Visual Report: No change from baseline              Pertinent Vitals/Pain Pain Assessment: No/denies pain     Hand Dominance Right   Extremity/Trunk Assessment Upper Extremity Assessment Upper Extremity Assessment: Overall WFL for tasks assessed;Generalized weakness   Lower Extremity Assessment Lower Extremity Assessment: Defer to PT evaluation       Communication Communication Communication: No difficulties   Cognition Arousal/Alertness: Awake/alert Behavior  During Therapy: WFL for tasks assessed/performed Overall Cognitive Status: Within Functional Limits for tasks assessed                                 General Comments: increased time to process. Pt is pleasant and agreeable              Home Living Family/patient expects to be discharged to:: Private residence Living Arrangements: Alone (daughter and grandchildren assist with needs) Available Help at Discharge: Family;Available 24 hours/day;Personal care attendant Type of Home: House Home Access: Ramped entrance     Home Layout: One level               Home Equipment: Wheelchair - manual;Hospital bed;Other (comment) (hoyer lift and sling)          Prior  Functioning/Environment Level of Independence: Needs assistance  Gait / Transfers Assistance Needed: Pt does not stand or ambulation and family/aide assist her with transfer to wheelchair with hoyer lift ADL's / Homemaking Assistance Needed: Pt has an aide that comes 3x/wk for 3 hours and family assist with self care and needs throughout the day. She uses a disposable brief for toileting needs.                     OT Goals(Current goals can be found in the care plan section) Acute Rehab OT Goals Patient Stated Goal: to go home OT Goal Formulation: With patient Time For Goal Achievement: 04/30/21 Potential to Achieve Goals: Fair   AM-PAC OT "6 Clicks" Daily Activity     Outcome Measure Help from another person eating meals?: A Little Help from another person taking care of personal grooming?: A Little Help from another person toileting, which includes using toliet, bedpan, or urinal?: Total Help from another person bathing (including washing, rinsing, drying)?: A Lot Help from another person to put on and taking off regular upper body clothing?: A Lot Help from another person to put on and taking off regular lower body clothing?: Total 6 Click Score: 12   End of Session Nurse Communication: Mobility status  Activity Tolerance: Patient tolerated treatment well Patient left: in bed;with call bell/phone within reach;with bed alarm set                   Time: 7939-0300 OT Time Calculation (min): 19 min Charges:  OT General Charges $OT Visit: 1 Visit OT Evaluation $OT Eval Moderate Complexity: 1 Mod OT Treatments $Therapeutic Activity: 8-22 mins  Darleen Crocker, MS, OTR/L , CBIS ascom [phone]  04/30/21, 11:10 AM

## 2021-04-30 NOTE — Progress Notes (Addendum)
PROGRESS NOTE    Cassandra Joseph   LEX:[id number]  DOB: [birth year]  PCP: Tracie Harrier, MD    DOA: 04/28/2021 LOS: 2    Brief Narrative / Hospital Course to Date:   75 year old female with past medical history of ESRD on dialysis TTS, anemia of chronic disease, paroxysmal A. 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  Evaluation in the ED revealed multifocal pneumonia on CT chest without contrast.  Patient was started on empiric IV antibiotics and admitted to [facility] service.  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 Day 3 of antibiotics -- Stop IV Rocephin and Zithromax --Start Omnicef to complete 5-day course of antibiotics --Vancomycin DC'd when MRSA screen returned negative --Supportive care: Antitussives --Questionable volume overload on admission, volume management by hemodialysis  Chronic respiratory failure with hypoxia -patient has been stable on baseline 3 L/min O2. --Supplement O2 to maintain sats above 88%  Obesity: Body mass index is 40.1 kg/m.  Complicates overall care and prognosis.  Recommend lifestyle modifications including physical activity and diet  for weight loss and overall long-term health.  Fluid overload ruled out -BNP was elevated over 1500 but in setting of ESRD, chest x-ray with interstitial edema but CT chest more consistent with multifocal pneumonia. No prior history of CHF.  Echo in January 2022 with EF 60 to 65%.  Echo this admission EF 55 to 60%  ESRD -on hemodialysis Tuesday Thursday Saturday --Nephrology following for dialysis -- Patient does not have reliable transportation to dialysis on Saturdays -- Renal navigator assisting   Anemia of chronic disease -hemoglobin stable and at baseline.  Continue monitor CBC.  Paroxysmal A. Fib -rate controlled.  Continue Coreg and Eliquis  Rheumatoid arthritis -stable without acute flare symptoms.  Continue home Plaquenil.  OSA on CPAP -nightly CPAP ordered  Multiple myeloma, relapsed -no acute issues related to this therefore will defer oncology consult.  Monitor CBC and renal function.  Home meds on hold: Midodrine, amlodipine, Coreg Lasix DC'd given patient is anuric.  DVT prophylaxis: apixaban (ELIQUIS) tablet 2.5 mg Start: 04/28/21 1000 apixaban (ELIQUIS) tablet 2.5 mg   Diet:  Diet Orders (From admission, onward)     Start     Ordered   04/29/21 1125  Diet 2 gram sodium Room service appropriate? Yes; Fluid consistency: Thin  Diet effective now       Question Answer Comment  Room service appropriate? Yes   Fluid consistency: Thin      04/29/21 1124              Code Status: Full Code   Subjective 04/30/21    Patient seen awake sitting up in bed today.  She reports ongoing cough and shortness  of breath.  Denies any fevers or chills.  States she will not have transportation to her outpatient dialysis tomorrow.  Frequently has issues with transportation on Saturdays causing her to be late for dialysis.  No other acute complaints.   Disposition Plan & Communication   Status is: Inpatient  Remains inpatient appropriate because: Patient has no  transportation to outpatient dialysis tomorrow.  Patient will have dialysis here tomorrow and discharge home afterward.  Dispo: The patient is from: Home              Anticipated d/c is to: Home              Patient currently is not medically stable to d/c.   Difficult to place patient No    Consults, Procedures, Significant Events   Consultants:  Nephrology  Procedures:  Routine hemodialysis  Antimicrobials:  Anti-infectives (From admission, onward)    Start     Dose/Rate Route Frequency Ordered Stop   04/28/21 1700  hydroxychloroquine (PLAQUENIL) tablet 200 mg        200 mg Oral Daily 04/28/21 1632     04/28/21 0500  vancomycin (VANCOREADY) IVPB 1500 mg/300 mL       See Hyperspace for full Linked Orders Report.   1,500 mg 150 mL/hr over 120 Minutes Intravenous  Once 04/28/21 0352 04/28/21 0753   04/28/21 0405  vancomycin variable dose per unstable renal function (pharmacist dosing)  Status:  Discontinued         Does not apply See admin instructions 04/28/21 0405 04/28/21 1627   04/28/21 0400  cefTRIAXone (ROCEPHIN) 2 g in sodium chloride 0.9 % 100 mL IVPB        2 g 200 mL/hr over 30 Minutes Intravenous Every 24 hours 04/28/21 0352 05/03/21 0359   04/28/21 0400  azithromycin (ZITHROMAX) 500 mg in sodium chloride 0.9 % 250 mL IVPB        500 mg 250 mL/hr over 60 Minutes Intravenous Every 24 hours 04/28/21 0352 05/03/21 0359   04/28/21 0400  vancomycin (VANCOCIN) IVPB 1000 mg/200 mL premix       See Hyperspace for full Linked Orders Report.   1,000 mg 200 mL/hr over 60 Minutes Intravenous  Once 04/28/21 0352 04/28/21 0547   04/28/21 0345  ceFEPIme (MAXIPIME) 1 g in sodium chloride 0.9 % 100 mL IVPB  Status:  Discontinued        1 g 200 mL/hr over 30 Minutes Intravenous  Once 04/28/21 0331 04/28/21 0359         Micro    Objective   Vitals:   04/30/21 0426 04/30/21 0454 04/30/21 0807 04/30/21 1304  BP: 134/70  117/62 140/62  Pulse: 80  81 83  Resp: 20  15 18    Temp: 97.7 F (36.5 C)  98 F (36.7 C) 98.2 F (36.8 C)  TempSrc: Oral  Axillary Oral  SpO2: 98%  100% 95%  Weight:  112.7 kg    Height:        Intake/Output Summary (Last 24 hours) at 04/30/2021 1412 Last data filed at 04/30/2021 8299 Gross per 24 hour  Intake 500 ml  Output 0 ml  Net 500 ml   Filed Weights   04/28/21 0348 04/29/21 0227 04/30/21 0454  Weight: 115.2 kg 112 kg 112.7 kg    Physical Exam:  General exam: awake, alert, no acute distress, obese Respiratory system: CTAB diminished bases, no wheezes or rhonchi, normal respiratory effort, on 4 L/min nasal cannula oxygen (up from  3 L/min baseline). Cardiovascular system: normal S1/S2, RRR, no pedal edema.   Central nervous system: A&O x3. no gross focal neurologic deficits, normal speech Extremities: moves all, no edema, normal tone Psychiatry: normal mood, congruent affect, judgement and insight appear normal  Labs   Data Reviewed: I have personally reviewed following labs and imaging studies  CBC: Recent Labs  Lab 04/27/21 1552 04/29/21 0500 04/30/21 0455  WBC 4.7 5.8 5.7  HGB 9.7* 10.0* 9.3*  HCT 30.9* 32.0* 29.1*  MCV 101.0* 103.2* 101.7*  PLT 160 190 683   Basic Metabolic Panel: Recent Labs  Lab 04/27/21 1552 04/29/21 0500 04/30/21 0455  NA 136 136 135  K 3.5 4.0 3.4*  CL 96* 96* 96*  CO2 29 28 29   GLUCOSE 115* 77 88  BUN 28* 44* 30*  CREATININE 3.93* 6.07* 4.65*  CALCIUM 9.1 9.3 9.2  MG  --  1.9  --    GFR: Estimated Creatinine Clearance: 13.5 mL/min (A) (by C-G formula based on SCr of 4.65 mg/dL (H)). Liver Function Tests: No results for input(s): AST, ALT, ALKPHOS, BILITOT, PROT, ALBUMIN in the last 168 hours. No results for input(s): LIPASE, AMYLASE in the last 168 hours. No results for input(s): AMMONIA in the last 168 hours. Coagulation Profile: Recent Labs  Lab 04/28/21 0125  INR 1.2   Cardiac Enzymes: No results for input(s): CKTOTAL, CKMB, CKMBINDEX, TROPONINI in the last  168 hours. BNP (last 3 results) No results for input(s): PROBNP in the last 8760 hours. HbA1C: No results for input(s): HGBA1C in the last 72 hours. CBG: No results for input(s): GLUCAP in the last 168 hours. Lipid Profile: No results for input(s): CHOL, HDL, LDLCALC, TRIG, CHOLHDL, LDLDIRECT in the last 72 hours. Thyroid Function Tests: No results for input(s): TSH, T4TOTAL, FREET4, T3FREE, THYROIDAB in the last 72 hours. Anemia Panel: No results for input(s): VITAMINB12, FOLATE, FERRITIN, TIBC, IRON, RETICCTPCT in the last 72 hours. Sepsis Labs: Recent Labs  Lab 04/27/21 1552 04/28/21 0357  PROCALCITON 1.29  --   LATICACIDVEN  --  0.8    Recent Results (from the past 240 hour(s))  Resp Panel by RT-PCR (Flu A&B, Covid) Nasopharyngeal Swab     Status: None   Collection Time: 04/28/21  1:25 AM   Specimen: Nasopharyngeal Swab; Nasopharyngeal(NP) swabs in vial transport medium  Result Value Ref Range Status   SARS Coronavirus 2 by RT PCR NEGATIVE NEGATIVE Final    Comment: (NOTE) SARS-CoV-2 target nucleic acids are NOT DETECTED.  The SARS-CoV-2 RNA is generally detectable in upper respiratory specimens during the acute phase of infection. The lowest concentration of SARS-CoV-2 viral copies this assay can detect is 138 copies/mL. A negative result does not preclude SARS-Cov-2 infection and should not be used as the sole basis for treatment or other patient management decisions. A negative result may occur with  improper specimen collection/handling, submission of specimen other than nasopharyngeal swab, presence of viral mutation(s) within the areas targeted by this assay, and inadequate number of viral copies(<138 copies/mL). A negative result must be combined with clinical observations, patient history, and epidemiological information. The expected result is Negative.  Fact Sheet for Patients:  EntrepreneurPulse.com.au  Fact Sheet for Healthcare  Providers:  IncredibleEmployment.be  This test is no t yet approved or cleared by the [place] FDA and  has been authorized for detection and/or diagnosis of SARS-CoV-2 by FDA under an Emergency Use Authorization (EUA). This EUA will remain  in effect (meaning this test can be used)  for the duration of the COVID-19 declaration under Section 564(b)(1) of the Act, 21 U.S.C.section 360bbb-3(b)(1), unless the authorization is terminated  or revoked sooner.       Influenza A by PCR NEGATIVE NEGATIVE Final   Influenza B by PCR NEGATIVE NEGATIVE Final    Comment: (NOTE) The Xpert Xpress SARS-CoV-2/FLU/RSV plus assay is intended as an aid in the diagnosis of influenza from Nasopharyngeal swab specimens and should not be used as a sole basis for treatment. Nasal washings and aspirates are unacceptable for Xpert Xpress SARS-CoV-2/FLU/RSV testing.  Fact Sheet for Patients: EntrepreneurPulse.com.au  Fact Sheet for Healthcare Providers: IncredibleEmployment.be  This test is not yet approved or cleared by the [place] FDA and has been authorized for detection and/or diagnosis of SARS-CoV-2 by FDA under an Emergency Use Authorization (EUA). This EUA will remain in effect (meaning this test can be used) for the duration of the COVID-19 declaration under Section 564(b)(1) of the Act, 21 U.S.C. section 360bbb-3(b)(1), unless the authorization is terminated or revoked.  Performed at [facility], [address] [place], [place] [address]   Blood Culture (routine x 2)     Status: None (Preliminary result)   Collection Time: 04/28/21  3:57 AM   Specimen: BLOOD  Result Value Ref Range Status   Specimen Description BLOOD LEFT FA  Final   Special Requests   Final    BOTTLES DRAWN AEROBIC AND ANAEROBIC Blood Culture adequate volume   Culture   Final    NO GROWTH 2 DAYS Performed at [facility], [address] [place], [place] [address]    Report Status PENDING  Incomplete  Blood Culture (routine x 2)     Status: None (Preliminary result)   Collection Time: 04/28/21  3:57 AM   Specimen: BLOOD  Result Value Ref Range Status   Specimen Description BLOOD LEFT HAND  Final   Special Requests   Final    BOTTLES DRAWN AEROBIC AND ANAEROBIC Blood Culture adequate volume   Culture   Final    NO GROWTH 2 DAYS Performed at [facility], [address] [place], [place] [address]    Report Status PENDING  Incomplete  MRSA Next Gen by PCR, Nasal     Status: None   Collection Time: 04/28/21  7:52 AM   Specimen: Nasal Mucosa; Nasal Swab  Result Value Ref Range Status   MRSA by PCR Next Gen NOT DETECTED NOT DETECTED Final    Comment: (NOTE) The GeneXpert MRSA Assay (FDA approved for NASAL specimens only), is one component of a comprehensive MRSA colonization surveillance program. It is not intended to diagnose MRSA infection nor to guide or monitor treatment for MRSA infections. Test performance is not FDA approved in patients less than 14 years old. Performed at [facility], [address], [place], [place] [address]       Imaging Studies   ECHOCARDIOGRAM COMPLETE  Result Date: 04/28/2021    ECHOCARDIOGRAM REPORT   Patient Name:   Cassandra Joseph Date of Exam: 04/28/2021 Medical Rec #:  [id number]      Height:       66.0 in Accession #:    [id number]     Weight:       254.0 lb Date of Birth:  01/23/1946      BSA:          2.214 m Patient Age:    37 years       BP:  108/59 mmHg Patient Gender: F              HR:           69 bpm. Exam Location:  [facility] Procedure: 2D Echo, Cardiac Doppler and Color Doppler Indications:     CHF-acute diastolic Z16.96  History:         Patient has no prior history of Echocardiogram examinations.                  CHF; Risk Factors:Hypertension.  Sonographer:     Sherrie Sport Referring Phys:  [id number] Athena Masse Diagnosing Phys: Kate Sable MD  Sonographer Comments: Suboptimal apical window. IMPRESSIONS  1. Left ventricular ejection fraction, by estimation, is 55 to 60%. The left ventricle has normal function. The left ventricle has no regional wall motion abnormalities. Left ventricular diastolic parameters are indeterminate.  2. Right ventricular systolic function is normal. The right ventricular size is not well visualized.  3. Left atrial size was severely dilated.  4. Right atrial size was moderately dilated.  5. The mitral valve is degenerative. Mild mitral valve regurgitation. Mild mitral stenosis. The mean mitral valve gradient is 4.0 mmHg. Moderate to severe mitral annular calcification.  6. The aortic valve is calcified. Aortic valve regurgitation is not visualized. Mild to moderate aortic valve sclerosis/calcification is present, without any evidence of aortic stenosis.  7. Aortic dilatation noted. There is mild-moderate dilatation of the aortic root, measuring 44 mm.  8. The inferior vena cava is normal in size with <50% respiratory variability, suggesting right atrial pressure of 8 mmHg. FINDINGS  Left Ventricle: Left ventricular ejection fraction, by estimation, is 55 to 60%. The left ventricle has normal function. The left ventricle has no regional wall motion abnormalities. The left ventricular internal cavity size was normal in size. There is  no left ventricular hypertrophy. Left ventricular diastolic parameters are indeterminate. Right Ventricle: The right ventricular size is not well visualized. No increase in right ventricular wall thickness. Right ventricular systolic function is normal. Left Atrium: Left atrial size was severely dilated. Right Atrium: Right atrial size was moderately dilated. Pericardium: There is no evidence of pericardial effusion. Mitral Valve: The mitral valve is degenerative in appearance. Moderate to severe mitral annular calcification. Mild mitral valve regurgitation. Mild mitral valve  stenosis. MV peak gradient, 14.3 mmHg. The mean mitral valve gradient is 4.0 mmHg. Tricuspid Valve: The tricuspid valve is normal in structure. Tricuspid valve regurgitation is trivial. Aortic Valve: The aortic valve is calcified. Aortic valve regurgitation is not visualized. Mild to moderate aortic valve sclerosis/calcification is present, without any evidence of aortic stenosis. Aortic valve mean gradient measures 6.0 mmHg. Aortic valve peak gradient measures 11.7 mmHg. Aortic valve area, by VTI measures 1.95 cm. Pulmonic Valve: The pulmonic valve was not well visualized. Pulmonic valve regurgitation is trivial. Aorta: Aortic dilatation noted. There is mild-moderate dilatation of the aortic root, measuring 44 mm. Venous: The inferior vena cava is normal in size with less than 50% respiratory variability, suggesting right atrial pressure of 8 mmHg. IAS/Shunts: No atrial level shunt detected by color flow Doppler.  LEFT VENTRICLE PLAX 2D LVIDd:         3.70 cm  Diastology LVIDs:         2.50 cm  LV e' medial:   4.46 cm/s LV PW:         1.80 cm  LV E/e' medial: 34.5 LV IVS:        1.55 cm LVOT  diam:     2.00 cm LV SV:         63 LV SV Index:   29 LVOT Area:     3.14 cm  RIGHT VENTRICLE RV Basal diam:  4.90 cm RV S prime:     13.40 cm/s TAPSE (M-mode): 4.7 cm LEFT ATRIUM              Index       RIGHT ATRIUM           Index LA diam:        6.60 cm  2.98 cm/m  RA Area:     25.00 cm LA Vol (A2C):   176.0 ml 79.51 ml/m RA Volume:   82.50 ml  37.27 ml/m LA Vol (A4C):   103.0 ml 46.53 ml/m LA Biplane Vol: 142.0 ml 64.15 ml/m  AORTIC VALVE                    PULMONIC VALVE AV Area (Vmax):    1.68 cm     PV Vmax:        0.89 m/s AV Area (Vmean):   1.83 cm     PV Peak grad:   3.1 mmHg AV Area (VTI):     1.95 cm     RVOT Peak grad: 5 mmHg AV Vmax:           171.00 cm/s AV Vmean:          112.333 cm/s AV VTI:            0.324 m AV Peak Grad:      11.7 mmHg AV Mean Grad:      6.0 mmHg LVOT Vmax:         91.40 cm/s  LVOT Vmean:        65.600 cm/s LVOT VTI:          0.201 m LVOT/AV VTI ratio: 0.62  AORTA Ao Root diam: 3.77 cm MITRAL VALVE                TRICUSPID VALVE MV Area (PHT): 2.45 cm     TR Peak grad:   13.2 mmHg MV Area VTI:   1.51 cm     TR Vmax:        182.00 cm/s MV Peak grad:  14.3 mmHg MV Mean grad:  4.0 mmHg     SHUNTS MV Vmax:       1.89 m/s     Systemic VTI:  0.20 m MV Vmean:      91.9 cm/s    Systemic Diam: 2.00 cm MV Decel Time: 310 msec MV E velocity: 154.00 cm/s Kate Sable MD Electronically signed by Kate Sable MD Signature Date/Time: 04/28/2021/4:04:18 PM    Final      Medications   Scheduled Meds:  apixaban  2.5 mg Oral BID   atorvastatin  80 mg Oral Daily   Chlorhexidine Gluconate Cloth  6 each Topical Q0600   feeding supplement (NEPRO CARB STEADY)  237 mL Oral TID BM   fluticasone  1-2 spray Each Nare Q1200   gabapentin  300 mg Oral TID   hydroxychloroquine  200 mg Oral Daily   multivitamin  1 tablet Oral QHS   sevelamer carbonate  1,600 mg Oral 2 times per day   sevelamer carbonate  2,400 mg Oral TID WC   Continuous Infusions:  azithromycin Stopped (04/30/21 0321)   cefTRIAXone (ROCEPHIN)  IV Stopped (04/30/21 0403)  LOS: 2 days    Time spent: 30 minutes with > 50% spent at bedside and in coordination of care     Ezekiel Slocumb, DO [facility]  04/30/2021, 2:12 PM      If 7PM-7AM, please contact night-coverage. How to contact the [facility] Attending or Consulting provider [place] or covering provider during after hours [place], for this patient?    Check the care team in [facility] and look for a) attending/consulting [facility] provider listed and b) the [facility] team listed Log into [email or web address] and use [place]'s universal password to access. If you do not have the password, please contact the hospital operator. Locate the [facility] provider you are looking for under [facility] and page to a number that you can be directly reached. If you still  have difficulty reaching the provider, please page the [facility] (Director on Call) for the Hospitalists listed on amion for assistance.

## 2021-04-30 NOTE — Evaluation (Signed)
Physical Therapy Evaluation Patient Details Name: Cassandra Joseph MRN: [id number] DOB: [birth year] Today's Date: 04/30/2021  History of Present Illness  75 year old female with past medical history of ESRD on dialysis TTS, anemia of chronic disease, paroxysmal A. 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  Pt reports she has not stood/walked since last October and November, but would like to attempt today.  Pt put forth good effort, but unable to perform with +1 maxA.  Pt requires +2 mod-maxA to be able to come upright for >15 seconds.  Pt tires quickly and has to sit.  Pt stood for seconf bout for ~10 seconds before tiring.  Pt then placed back in bed with assistance and verbal cuing and left with all needs met.  Warm blankets provided due to pt feeling cold.  Pt will benefit from skilled PT intervention to increase independence and safety with basic mobility in preparation for discharge to the venue listed below.   Due to pt being close to baseline level, home health still appropriate at this time with current services.      Recommendations for follow up therapy are one component of a multi-disciplinary discharge planning process, led by the attending physician.  Recommendations may be updated based on patient status, additional functional criteria and insurance authorization.  Follow Up Recommendations Home health PT    Equipment Recommendations  Other (comment) (Pt reports needing new hoyer lift slings.)    Recommendations for Other Services       Precautions / Restrictions Precautions Precautions:  Fall Restrictions Weight Bearing Restrictions: No      Mobility  Bed Mobility Overal bed mobility: Needs Assistance Bed Mobility: Supine to Sit;Sit to Supine     Supine to sit: Min assist Sit to supine: Mod assist   General bed mobility comments: min A for trunk support to EOB and needing assist with B LEs to return safely to bed    Transfers Overall transfer level: Needs assistance Equipment used: Rolling walker (2 wheeled) Transfers: Sit to/from Stand Sit to Stand: Mod assist;Max assist;+2 physical assistance;+2 safety/equipment         General transfer comment: Pt states she has not stood up since November of last year, and would like to attempt.  Ambulation/Gait             General Gait Details: Not attempted due to safety and decrease ability in standing.  Stairs            Wheelchair Mobility    Modified Rankin (Stroke Patients Only)       Balance Overall balance assessment: Needs assistance Sitting-balance support: Feet supported;Bilateral upper extremity supported Sitting balance-Leahy Scale: Good     Standing balance support: Bilateral upper extremity supported Standing balance-Leahy Scale: Poor                               Pertinent Vitals/Pain Pain Assessment: No/denies pain    Home Living Family/patient expects to be discharged to:: Private residence Living Arrangements: Alone (daughter and grandchildren assist with needs) Available Help at Discharge: Family;Available 24 hours/day;Personal care attendant  Type of Home: House Home Access: Ramped entrance     Home Layout: One level Home Equipment: Wheelchair - manual;Hospital bed;Other (comment) (hoyer lift and sling)      Prior Function Level of Independence: Needs assistance   Gait / Transfers Assistance Needed: Pt does not stand or ambulation and family/aide assist her with transfer to wheelchair with hoyer lift  ADL's / Homemaking Assistance Needed: Pt has an  aide that comes 3x/wk for 3 hours and family assist with self care and needs throughout the day. She uses a disposable brief for toileting needs.        Hand Dominance   Dominant Hand: Right    Extremity/Trunk Assessment   Upper Extremity Assessment Upper Extremity Assessment: RUE deficits/detail;Generalized weakness RUE Deficits / Details: Pt has decrease functionality of the R UE. RUE Coordination: decreased gross motor;decreased fine motor    Lower Extremity Assessment Lower Extremity Assessment: Generalized weakness    Cervical / Trunk Assessment Cervical / Trunk Assessment: Normal  Communication   Communication: No difficulties  Cognition Arousal/Alertness: Awake/alert Behavior During Therapy: WFL for tasks assessed/performed Overall Cognitive Status: Within Functional Limits for tasks assessed                                 General Comments: increased time to process. Pt is pleasant and agreeable      General Comments      Exercises Total Joint Exercises Ankle Circles/Pumps: AROM;Strengthening;Both;10 reps;Supine Quad Sets: AROM;Strengthening;Both;10 reps;Supine Gluteal Sets: AROM;Strengthening;Both;10 reps;Supine Hip ABduction/ADduction: AROM;Strengthening;Both;10 reps;Supine Straight Leg Raises: AAROM;Strengthening;Both;10 reps;Supine Long Arc Quad: AROM;Strengthening;Both;10 reps;Supine Other Exercises Other Exercises: Pt educated on role of PT and services provided during hospital stay.  Pt aldo educated on the importance of performing bed-level exercises in order to keep current strength in the LEs.   Assessment/Plan    PT Assessment Patient needs continued PT services  PT Problem List Decreased strength;Decreased activity tolerance;Decreased balance;Decreased mobility       PT Treatment Interventions DME instruction;Gait training;Functional mobility training;Therapeutic activities;Therapeutic exercise;Balance training    PT Goals  (Current goals can be found in the Care Plan section)  Acute Rehab PT Goals Patient Stated Goal: to go home PT Goal Formulation: With patient Time For Goal Achievement: 05/14/21 Potential to Achieve Goals: Good    Frequency Min 2X/week   Barriers to discharge        Co-evaluation               AM-PAC PT "6 Clicks" Mobility  Outcome Measure Help needed turning from your back to your side while in a flat bed without using bedrails?: A Lot Help needed moving from lying on your back to sitting on the side of a flat bed without using bedrails?: A Lot Help needed moving to and from a bed to a chair (including a wheelchair)?: Total Help needed standing up from a chair using your arms (e.g., wheelchair or bedside chair)?: A Lot Help needed to walk in hospital room?: Total Help needed climbing 3-5 steps with a railing? : Total 6 Click Score: 9    End of Session Equipment Utilized During Treatment: Gait belt Activity Tolerance: Patient tolerated treatment well Patient left: in bed;with call bell/phone within reach;with bed alarm set Nurse Communication: Mobility status PT Visit Diagnosis: Unsteadiness on feet (R26.81);Difficulty in walking, not elsewhere classified (R26.2)    Time: 0355-9741 PT Time Calculation (min) (ACUTE ONLY): 45 min   Charges:  PT Evaluation $PT Eval Low Complexity: 1 Low PT Treatments $Therapeutic Exercise: 8-22 mins $Therapeutic Activity: 23-37 mins        Gwenlyn Saran, PT, DPT 04/30/21, 2:22 PM   Christie Nottingham 04/30/2021, 2:12 PM

## 2021-04-30 NOTE — Plan of Care (Signed)
  Problem: Education: Goal: Knowledge of General Education information will improve Description: Including pain rating scale, medication(s)/side effects and non-pharmacologic comfort measures Outcome: Progressing   Problem: Clinical Measurements: Goal: Ability to maintain clinical measurements within normal limits will improve Outcome: Progressing Goal: Respiratory complications will improve Outcome: Progressing Goal: Cardiovascular complication will be avoided Outcome: Progressing   Problem: Coping: Goal: Level of anxiety will decrease Outcome: Progressing   Problem: Elimination: Goal: Will not experience complications related to bowel motility Outcome: Progressing

## 2021-04-30 NOTE — Progress Notes (Signed)
CSW spoke with HD coordinator Estill Bamberg regarding noted transportation issues for patient for Saturday HD. Estill Bamberg reports she is aware, and has reached out to HD center to see if they have a MWF opening.  Estill Bamberg reports she is following.   [place], [place]

## 2021-04-30 NOTE — TOC Initial Note (Signed)
Transition of Care [facility]) - Initial/Assessment Note    Patient Details  Name: Cassandra Joseph MRN: [id number] Date of Birth: 12-25-1945  Transition of Care [facility]) CM/SW Contact:    Alberteen Sam, LCSW Phone Number: 04/30/2021, 3:01 PM  Clinical Narrative:                  Patient provided with home health recommendations, agreeable for [facility] PT and RN at time of discharge through [place] with [facility] aware.   TOC following for any additional dc needs that may arise.    Expected Discharge Plan: [place] Barriers to Discharge: Continued Medical Work up   Patient Goals and CMS Choice   CMS Medicare.gov Compare Post Acute Care list provided to:: Patient Choice offered to / list presented to : Patient  Expected Discharge Plan and Services Expected Discharge Plan: [place] Arranged: PT, RN [facility] Agency: [place] ([place]) Date [facility] Agency Contacted: 04/30/21 Time HH Agency Contacted: 1501 Representative spoke with at [place]: Corene Cornea  Prior Living Arrangements/Services                       Activities of Daily Living Home Assistive Devices/Equipment: CPAP, Oxygen, Wheelchair ADL Screening (condition at time of admission) Patient's cognitive ability adequate to safely complete daily activities?: Yes Is the patient deaf or have difficulty hearing?: Yes Does the patient have difficulty seeing, even when wearing glasses/contacts?: No Does the patient have difficulty concentrating, remembering, or making decisions?: No Patient able to express need for assistance with ADLs?: Yes Does the patient have difficulty dressing or bathing?: Yes Independently performs ADLs?: No Communication: Independent Dressing (OT): Needs assistance Is this a change from baseline?: Pre-admission baseline Grooming: Needs assistance Is this a change from baseline?: Pre-admission baseline Feeding:  Needs assistance (needs assistance setting up; pt reports not able to use right arm/hand) Is this a change from baseline?: Pre-admission baseline Bathing: Needs assistance Is this a change from baseline?: Pre-admission baseline Toileting: Needs assistance Is this a change from baseline?: Pre-admission baseline In/Out Bed: Dependent Is this a change from baseline?: Pre-admission baseline Walks in Home: Dependent (uses wheelchair .Marland Kitchenno longer walks) Is this a change from baseline?: Pre-admission baseline Does the patient have difficulty walking or climbing stairs?: Yes Weakness of Legs: Both Weakness of Arms/Hands: Right  Permission Sought/Granted                  Emotional Assessment       Orientation: : Oriented to Self, Oriented to Place, Oriented to  Time, Oriented to Situation Alcohol / Substance Use: Not Applicable Psych Involvement: No (comment)  Admission diagnosis:  CHF (congestive heart failure) (HCC) [I50.9] ESRD on hemodialysis ([place]) [N18.6, Z99.2] Acute dyspnea [R06.00] Multifocal pneumonia [J18.9] Acute on chronic congestive heart failure, unspecified heart failure type (HCC) [I50.9] Patient Active Problem List   Diagnosis Date Noted   ESRD on hemodialysis ([place]) 04/28/2021   PAF (paroxysmal atrial fibrillation) ([place]) 04/28/2021   Chronic anticoagulation 04/28/2021   Chronic respiratory failure with hypoxia ([place]) 04/28/2021   Rheumatoid arthritis ([place]) 04/28/2021   OSA on CPAP 04/28/2021   Obesity, Class III, BMI 40-49.9 (morbid obesity) ([place]) 04/28/2021   Multiple myeloma in relapse ([place]) 04/28/2021   Multifocal  pneumonia 04/28/2021   CHF (congestive heart failure) ([place]) 04/28/2021   PCP:  Tracie Harrier, MD Pharmacy:   [id number] [id number]: [phone]: [phone]    Social Determinants of Health (SDOH) Interventions    Readmission Risk Interventions No  flowsheet data found.

## 2021-05-01 LAB — BASIC METABOLIC PANEL
Anion gap: 13 (ref 5–15)
BUN: 41 mg/dL — ABNORMAL HIGH (ref 8–23)
CO2: 29 mmol/L (ref 22–32)
Calcium: 10 mg/dL (ref 8.9–10.3)
Chloride: 94 mmol/L — ABNORMAL LOW (ref 98–111)
Creatinine, Ser: 5.63 mg/dL — ABNORMAL HIGH (ref 0.44–1.00)
GFR, Estimated: 7 mL/min — ABNORMAL LOW (ref >60)
Glucose, Bld: 86 mg/dL (ref 70–99)
Potassium: 4 mmol/L (ref 3.5–5.1)
Sodium: 136 mmol/L (ref 135–145)

## 2021-05-01 MED ORDER — CEFDINIR 300 MG PO CAPS: 300.0000 mg | ORAL_CAPSULE | Freq: Two times a day (BID) | ORAL | 0 refills | Status: DC

## 2021-05-01 MED ORDER — NEPRO/CARBSTEADY PO LIQD: 237.0000 mL | Freq: Three times a day (TID) | ORAL | 0 refills | Status: AC

## 2021-05-01 MED ORDER — AZITHROMYCIN 500 MG PO TABS: 500.0000 mg | ORAL_TABLET | Freq: Every day | ORAL | 0 refills | Status: AC

## 2021-05-01 NOTE — Discharge Summary (Signed)
Physician Discharge Summary  Cassandra Joseph DXI:[id number] DOB: [birth year] DOA: 04/28/2021  PCP: Tracie Harrier, MD  Admit date: 04/28/2021 Discharge date: 05/01/2021  Admitted From: home Disposition:  home  Recommendations for Outpatient Follow-up:  Follow up with PCP in 1-2 weeks Please obtain BMP/CBC in one week Please follow up with nephrology and attend all dialysis appointments  Home Health: PT, RN  Equipment/Devices: none   Discharge Condition: Stable  CODE STATUS: Full  Diet recommendation: Low sodium   Discharge Diagnoses: Principal Problem:   Multifocal pneumonia Active Problems:   ESRD on hemodialysis ([place])   PAF (paroxysmal atrial fibrillation) (HCC)   Chronic anticoagulation   Chronic respiratory failure with hypoxia (HCC)   Rheumatoid arthritis (HCC)   OSA on CPAP   Obesity, Class III, BMI 40-49.9 (morbid obesity) ([place])   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   Evaluation in the ED revealed multifocal pneumonia on CT chest without contrast.  Patient was started on empiric IV antibiotics and admitted to [facility] service.   Dyspnea secondary to multifocal pneumonia -POA with 1 week history of shortness of breath and cough, no fevers, chest CT with multifocal pneumonia. -- Treated with IV Rocephin and Zithromax    (Vancomycin DC'd when MRSA screen returned negative) --Transitioned to [facility] to complete 5-day course of  antibiotics --Supportive care: Antitussives --Questionable volume overload on admission, volume management by hemodialysis   Patient clinically improved and stable for discharge home today. \   Chronic respiratory failure with hypoxia -patient has been stable on baseline 3 L/min O2. --Supplement O2 to maintain sats above 88%   Obesity: Body mass index is 40.1 kg/m.  Complicates overall care and prognosis.  Recommend lifestyle modifications including physical activity and diet for weight loss and overall long-term health.  Fluid overload ruled out -BNP was elevated over 1500 but in setting of ESRD, chest x-ray with interstitial edema but CT chest more consistent with multifocal pneumonia. No prior history of CHF.  Echo in January 2022 with EF 60 to 65%.  Echo this admission EF 55 to 60%   ESRD -on hemodialysis Tuesday Thursday Saturday --Nephrology following for dialysis -- Patient does not have reliable transportation to dialysis on Saturdays -- Renal navigator assisting   Anemia of chronic disease -hemoglobin stable and at baseline.  Continue monitor CBC.  Paroxysmal A. Fib -rate controlled.  Continue Coreg and Eliquis  Rheumatoid arthritis -stable without acute flare symptoms.  Continue home Plaquenil.  OSA on CPAP -nightly CPAP ordered  Multiple myeloma, relapsed -no acute issues related to this therefore will defer oncology consult.  Monitor CBC and renal function.   Lasix DC'd given patient is anuric.   Discharge Instructions   Discharge Instructions     (HEART FAILURE PATIENTS) Call MD:  Anytime you have any of the following symptoms: 1) 3 pound weight gain in 24 hours or 5 pounds in 1 week 2) shortness of breath, with or without a dry hacking cough 3) swelling in the hands, feet  or stomach 4) if you have to sleep on extra pillows at night in order to breathe.   Complete by: As directed    Call MD for:  extreme fatigue   Complete by: As directed    Call MD for:   persistant dizziness or light-headedness   Complete by: As directed    Call MD for:  persistant nausea and vomiting   Complete by: As directed    Call MD for:  severe uncontrolled pain   Complete by: As directed    Call MD for:  temperature >100.4   Complete by: As directed    Diet - low sodium heart healthy   Complete by: As directed    Discharge instructions   Complete by: As directed    Continue taking both antibiotics (cefdinir and azithromycin) as prescribed for THREE more days to complete treatment for you pneumonia.  If you start having symptoms again after antibiotics are finished, please call your doctor to be seen and evaluated.   Increase activity slowly   Complete by: As directed       Allergies as of 05/01/2021       Reactions   Shellfish Allergy Other (See Comments)   Unknown reaction        Medication List     STOP taking these medications    doxycycline 100 MG capsule Commonly known as: VIBRAMYCIN   predniSONE 20 MG tablet Commonly known as: DELTASONE       TAKE these medications    amLODipine 10 MG tablet Commonly known as: NORVASC Take 10 mg by mouth daily.   atorvastatin 80 MG tablet Commonly known as: LIPITOR Take 80 mg by mouth daily.   azithromycin 500 MG tablet Commonly known as: Zithromax Take 1 tablet (500 mg total) by mouth daily for 3 days. Take 1 tablet daily for 3 days.   b complex-vitamin c-folic acid 0.8 MG Tabs tablet Take 1 tablet by mouth daily.   carvedilol 25 MG tablet Commonly known as: COREG Take 25 mg by mouth 2 (two) times daily.   cefdinir 300 MG capsule Commonly known as: OMNICEF Take 1 capsule (300 mg total) by mouth 2 (two) times daily.   docusate sodium 100 MG capsule Commonly known as: COLACE Take 100 mg by mouth 2 (two) times daily.   Eliquis 2.5 MG Tabs tablet Generic drug: apixaban Take 2.5 mg by mouth 2 (two) times daily.   feeding supplement (NEPRO CARB STEADY) Liqd Take 237 mLs by mouth 3  (three) times daily between meals.   fluticasone 50 MCG/ACT nasal spray Commonly known as: FLONASE Place 1-2 sprays into both nostrils daily at 12 noon.   gabapentin 300 MG capsule Commonly known as: NEURONTIN Take 300 mg by mouth 3 (three) times daily.   HYDROcodone-acetaminophen 5-325 MG tablet Commonly known as: NORCO/VICODIN Take 1 tablet by mouth 2 (two) times daily as needed for pain.   hydroxychloroquine 200 MG tablet Commonly known as: PLAQUENIL Take 200 mg by mouth daily.   midodrine 10 MG tablet Commonly known as: PROAMATINE Take 10 mg by mouth 3 (three) times daily.   sevelamer carbonate 800 MG tablet Commonly known as: RENVELA Take 1,600-2,400 mg by mouth 3 (three) times daily. Take 3 tabs with meals and 2 tabs with snacks        Allergies  Allergen Reactions   Shellfish Allergy Other (See Comments)    Unknown reaction     If you experience worsening of your admission symptoms, develop  shortness of breath, life threatening emergency, suicidal or homicidal thoughts you must seek medical attention immediately by calling 911 or calling your MD immediately  if symptoms less severe.    Please note   You were cared for by a hospitalist during your hospital stay. If you have any questions about your discharge medications or the care you received while you were in the hospital after you are discharged, you can call the unit and asked to speak with the hospitalist on call if the hospitalist that took care of you is not available. Once you are discharged, your primary care physician will handle any further medical issues. Please note that NO REFILLS for any discharge medications will be authorized once you are discharged, as it is imperative that you return to your primary care physician (or establish a relationship with a primary care physician if you do not have one) for your aftercare needs so that they can reassess your need for medications and monitor your lab  values.   Consultations: Nephrology    Procedures/Studies: DG Chest 2 View  Result Date: 04/27/2021 CLINICAL DATA:  Shortness of breath. EXAM: CHEST - 2 VIEW COMPARISON:  None. FINDINGS: Mild to moderate severity diffusely increased interstitial lung markings are seen. Mild to moderate severity atelectatic changes are noted within the bilateral lung bases and mid right lung. Elevation of the right hemidiaphragm is seen. There is no evidence of a pleural effusion or pneumothorax. Mild to moderate severity enlargement of the cardiac silhouette is seen with marked severity calcification of the aortic arch. Degenerative changes seen throughout the thoracic spine. IMPRESSION: 1. Mild to moderate severity interstitial edema with mild to moderate severity bilateral atelectasis. Electronically Signed   By: Virgina Norfolk M.D.   On: 04/27/2021 19:25   CT Chest Wo Contrast  Result Date: 04/28/2021 CLINICAL DATA:  Respiratory illness.  Shortness of breath. EXAM: CT CHEST WITHOUT CONTRAST TECHNIQUE: Multidetector CT imaging of the chest was performed following the standard protocol without IV contrast. COMPARISON:  Chest CT dated 10/02/2020. FINDINGS: Evaluation of this exam is limited in the absence of intravenous contrast. Cardiovascular: Mild cardiomegaly. No pericardial effusion. Advanced three-vessel coronary vascular calcification and calcification of the mitral annulus. There is moderate atherosclerotic calcification of the thoracic aorta. No aneurysmal dilatation. There is mild dilatation of main pulmonary trunk suggestive of pulmonary hypertension. Mediastinum/Nodes: No definite hilar adenopathy. Evaluation however is very limited in the absence of contrast and due to consolidative changes of the lungs. The esophagus is grossly unremarkable. No mediastinal fluid collection. Lungs/Pleura: Patchy areas of consolidation involving the right upper lobe, left lower lobe as well as patchy and streaky right  lower lobe consolidation most consistent with pneumonia. Clinical correlation and follow-up to resolution recommended. No pleural effusion or pneumothorax. There is mucous secretion within the trachea and bilateral bronchi. The central airways however remain patent. Upper Abdomen: No acute findings. Similar appearance of the kidneys with parenchyma atrophy and hypodense lesions. 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 Degenerative changes of the spine. No acute osseous pathology. IMPRESSION: 1. Multifocal pneumonia. Clinical correlation and follow-up to resolution recommended. 2. Aortic Atherosclerosis (ICD10-I70.0). Electronically Signed   By: Anner Crete M.D.   On: 04/28/2021 02:06   ECHOCARDIOGRAM COMPLETE  Result Date: 04/28/2021    ECHOCARDIOGRAM REPORT   Patient Name:   POOJA CAMUSO Date of Exam: 04/28/2021 Medical Rec #:  [id number]      Height:       66.0 in Accession #:  [id number]     Weight:       254.0 lb Date of Birth:  04/08/1946      BSA:          2.214 m Patient Age:    75 years       BP:           108/59 mmHg Patient Gender: F              HR:           69 bpm. Exam Location:  [facility] Procedure: 2D Echo, Cardiac Doppler and Color Doppler Indications:     CHF-acute diastolic C37.62  History:         Patient has no prior history of Echocardiogram examinations.                  CHF; Risk Factors:Hypertension.  Sonographer:     Sherrie Sport Referring Phys:  [id number] Athena Masse Diagnosing Phys: Kate Sable MD  Sonographer Comments: Suboptimal apical window. IMPRESSIONS  1. Left ventricular ejection fraction, by estimation, is 55 to 60%. The left ventricle has normal function. The left ventricle has no regional wall motion abnormalities. Left ventricular diastolic parameters are indeterminate.  2. Right ventricular systolic function is normal. The right ventricular size is not well visualized.  3. Left atrial size was severely dilated.  4. Right atrial  size was moderately dilated.  5. The mitral valve is degenerative. Mild mitral valve regurgitation. Mild mitral stenosis. The mean mitral valve gradient is 4.0 mmHg. Moderate to severe mitral annular calcification.  6. The aortic valve is calcified. Aortic valve regurgitation is not visualized. Mild to moderate aortic valve sclerosis/calcification is present, without any evidence of aortic stenosis.  7. Aortic dilatation noted. There is mild-moderate dilatation of the aortic root, measuring 44 mm.  8. The inferior vena cava is normal in size with <50% respiratory variability, suggesting right atrial pressure of 8 mmHg. FINDINGS  Left Ventricle: Left ventricular ejection fraction, by estimation, is 55 to 60%. The left ventricle has normal function. The left ventricle has no regional wall motion abnormalities. The left ventricular internal cavity size was normal in size. There is  no left ventricular hypertrophy. Left ventricular diastolic parameters are indeterminate. Right Ventricle: The right ventricular size is not well visualized. No increase in right ventricular wall thickness. Right ventricular systolic function is normal. Left Atrium: Left atrial size was severely dilated. Right Atrium: Right atrial size was moderately dilated. Pericardium: There is no evidence of pericardial effusion. Mitral Valve: The mitral valve is degenerative in appearance. Moderate to severe mitral annular calcification. Mild mitral valve regurgitation. Mild mitral valve stenosis. MV peak gradient, 14.3 mmHg. The mean mitral valve gradient is 4.0 mmHg. Tricuspid Valve: The tricuspid valve is normal in structure. Tricuspid valve regurgitation is trivial. Aortic Valve: The aortic valve is calcified. Aortic valve regurgitation is not visualized. Mild to moderate aortic valve sclerosis/calcification is present, without any evidence of aortic stenosis. Aortic valve mean gradient measures 6.0 mmHg. Aortic valve peak gradient measures 11.7  mmHg. Aortic valve area, by VTI measures 1.95 cm. Pulmonic Valve: The pulmonic valve was not well visualized. Pulmonic valve regurgitation is trivial. Aorta: Aortic dilatation noted. There is mild-moderate dilatation of the aortic root, measuring 44 mm. Venous: The inferior vena cava is normal in size with less than 50% respiratory variability, suggesting right atrial pressure of 8 mmHg. IAS/Shunts: No atrial level shunt detected by color flow Doppler.  LEFT VENTRICLE PLAX 2D LVIDd:  3.70 cm  Diastology LVIDs:         2.50 cm  LV e' medial:   4.46 cm/s LV PW:         1.80 cm  LV E/e' medial: 34.5 LV IVS:        1.55 cm LVOT diam:     2.00 cm LV SV:         63 LV SV Index:   29 LVOT Area:     3.14 cm  RIGHT VENTRICLE RV Basal diam:  4.90 cm RV S prime:     13.40 cm/s TAPSE (M-mode): 4.7 cm LEFT ATRIUM              Index       RIGHT ATRIUM           Index LA diam:        6.60 cm  2.98 cm/m  RA Area:     25.00 cm LA Vol (A2C):   176.0 ml 79.51 ml/m RA Volume:   82.50 ml  37.27 ml/m LA Vol (A4C):   103.0 ml 46.53 ml/m LA Biplane Vol: 142.0 ml 64.15 ml/m  AORTIC VALVE                    PULMONIC VALVE AV Area (Vmax):    1.68 cm     PV Vmax:        0.89 m/s AV Area (Vmean):   1.83 cm     PV Peak grad:   3.1 mmHg AV Area (VTI):     1.95 cm     RVOT Peak grad: 5 mmHg AV Vmax:           171.00 cm/s AV Vmean:          112.333 cm/s AV VTI:            0.324 m AV Peak Grad:      11.7 mmHg AV Mean Grad:      6.0 mmHg LVOT Vmax:         91.40 cm/s LVOT Vmean:        65.600 cm/s LVOT VTI:          0.201 m LVOT/AV VTI ratio: 0.62  AORTA Ao Root diam: 3.77 cm MITRAL VALVE                TRICUSPID VALVE MV Area (PHT): 2.45 cm     TR Peak grad:   13.2 mmHg MV Area VTI:   1.51 cm     TR Vmax:        182.00 cm/s MV Peak grad:  14.3 mmHg MV Mean grad:  4.0 mmHg     SHUNTS MV Vmax:       1.89 m/s     Systemic VTI:  0.20 m MV Vmean:      91.9 cm/s    Systemic Diam: 2.00 cm MV Decel Time: 310 msec MV E velocity: 154.00  cm/s Kate Sable MD Electronically signed by Kate Sable MD Signature Date/Time: 04/28/2021/4:04:18 PM    Final      Routine Hemodialysis   Subjective: Pt reports feeling better.  Has dialysis today and looks forward to going home aftewards.  No other acute complaints.    Discharge Exam: Vitals:   05/01/21 0801 05/01/21 0813  BP:    Pulse:    Resp: 16 11  Temp:    SpO2:     Vitals:   05/01/21 0600 05/01/21 0737 05/01/21 0801  05/01/21 0813  BP:  135/75    Pulse:  72    Resp:  18 16 11   Temp:  98.3 F (36.8 C)    TempSrc:      SpO2:  97%    Weight: 117.8 kg     Height:        General: Pt is alert, awake, not in acute distress Cardiovascular: RRR, S1/S2 +, no rubs, no gallops Respiratory: CTA bilaterally, no wheezing, no rhonchi Abdominal: Soft, NT, ND, bowel sounds + Extremities: no edema, no cyanosis    The results of significant diagnostics from this hospitalization (including imaging, microbiology, ancillary and laboratory) are listed below for reference.     Microbiology: Recent Results (from the past 240 hour(s))  Resp Panel by RT-PCR (Flu A&B, Covid) Nasopharyngeal Swab     Status: None   Collection Time: 04/28/21  1:25 AM   Specimen: Nasopharyngeal Swab; Nasopharyngeal(NP) swabs in vial transport medium  Result Value Ref Range Status   SARS Coronavirus 2 by RT PCR NEGATIVE NEGATIVE Final    Comment: (NOTE) SARS-CoV-2 target nucleic acids are NOT DETECTED.  The SARS-CoV-2 RNA is generally detectable in upper respiratory specimens during the acute phase of infection. The lowest concentration of SARS-CoV-2 viral copies this assay can detect is 138 copies/mL. A negative result does not preclude SARS-Cov-2 infection and should not be used as the sole basis for treatment or other patient management decisions. A negative result may occur with  improper specimen collection/handling, submission of specimen other than nasopharyngeal swab, presence of  viral mutation(s) within the areas targeted by this assay, and inadequate number of viral copies(<138 copies/mL). A negative result must be combined with clinical observations, patient history, and epidemiological information. The expected result is Negative.  Fact Sheet for Patients:  EntrepreneurPulse.com.au  Fact Sheet for Healthcare Providers:  IncredibleEmployment.be  This test is no t yet approved or cleared by the [place] FDA and  has been authorized for detection and/or diagnosis of SARS-CoV-2 by FDA under an Emergency Use Authorization (EUA). This EUA will remain  in effect (meaning this test can be used) for the duration of the COVID-19 declaration under Section 564(b)(1) of the Act, 21 U.S.C.section 360bbb-3(b)(1), unless the authorization is terminated  or revoked sooner.       Influenza A by PCR NEGATIVE NEGATIVE Final   Influenza B by PCR NEGATIVE NEGATIVE Final    Comment: (NOTE) The Xpert Xpress SARS-CoV-2/FLU/RSV plus assay is intended as an aid in the diagnosis of influenza from Nasopharyngeal swab specimens and should not be used as a sole basis for treatment. Nasal washings and aspirates are unacceptable for Xpert Xpress SARS-CoV-2/FLU/RSV testing.  Fact Sheet for Patients: EntrepreneurPulse.com.au  Fact Sheet for Healthcare Providers: IncredibleEmployment.be  This test is not yet approved or cleared by the [place] FDA and has been authorized for detection and/or diagnosis of SARS-CoV-2 by FDA under an Emergency Use Authorization (EUA). This EUA will remain in effect (meaning this test can be used) for the duration of the COVID-19 declaration under Section 564(b)(1) of the Act, 21 U.S.C. section 360bbb-3(b)(1), unless the authorization is terminated or revoked.  Performed at [facility], [address] [place],  [address]   Blood Culture (routine x  2)     Status: None (Preliminary result)   Collection Time: 04/28/21  3:57 AM   Specimen: BLOOD  Result Value Ref Range Status   Specimen Description BLOOD LEFT FA  Final   Special Requests  Final    BOTTLES DRAWN AEROBIC AND ANAEROBIC Blood Culture adequate volume   Culture   Final    NO GROWTH 3 DAYS Performed at [facility], [address] [place], [place] [address]    Report Status PENDING  Incomplete  Blood Culture (routine x 2)     Status: None (Preliminary result)   Collection Time: 04/28/21  3:57 AM   Specimen: BLOOD  Result Value Ref Range Status   Specimen Description BLOOD LEFT HAND  Final   Special Requests   Final    BOTTLES DRAWN AEROBIC AND ANAEROBIC Blood Culture adequate volume   Culture   Final    NO GROWTH 3 DAYS Performed at [facility], [address] [place], [place] [address]    Report Status PENDING  Incomplete  MRSA Next Gen by PCR, Nasal     Status: None   Collection Time: 04/28/21  7:52 AM   Specimen: Nasal Mucosa; Nasal Swab  Result Value Ref Range Status   MRSA by PCR Next Gen NOT DETECTED NOT DETECTED Final    Comment: (NOTE) The GeneXpert MRSA Assay (FDA approved for NASAL specimens only), is one component of a comprehensive MRSA colonization surveillance program. It is not intended to diagnose MRSA infection nor to guide or monitor treatment for MRSA infections. Test performance is not FDA approved in patients less than 52 years old. Performed at [facility], [address] [place], [place] [address]      Labs: BNP (last 3 results) Recent Labs    04/28/21 0125  BNP 9,326.7*   Basic Metabolic Panel: Recent Labs  Lab 04/27/21 1552 04/29/21 0500 04/30/21 0455 05/01/21 0543  NA 136 136 135 136  K 3.5 4.0 3.4* 4.0  CL 96* 96* 96* 94*  CO2 29 28 29 29   GLUCOSE 115* 77 88 86  BUN 28* 44* 30* 41*  CREATININE 3.93* 6.07* 4.65* 5.63*  CALCIUM 9.1 9.3 9.2 10.0  MG  --  1.9  --   --    Liver  Function Tests: No results for input(s): AST, ALT, ALKPHOS, BILITOT, PROT, ALBUMIN in the last 168 hours. No results for input(s): LIPASE, AMYLASE in the last 168 hours. No results for input(s): AMMONIA in the last 168 hours. CBC: Recent Labs  Lab 04/27/21 1552 04/29/21 0500 04/30/21 0455  WBC 4.7 5.8 5.7  HGB 9.7* 10.0* 9.3*  HCT 30.9* 32.0* 29.1*  MCV 101.0* 103.2* 101.7*  PLT 160 190 183   Cardiac Enzymes: No results for input(s): CKTOTAL, CKMB, CKMBINDEX, TROPONINI in the last 168 hours. BNP: Invalid input(s): POCBNP CBG: No results for input(s): GLUCAP in the last 168 hours. D-Dimer No results for input(s): DDIMER in the last 72 hours. Hgb A1c No results for input(s): HGBA1C in the last 72 hours. Lipid Profile No results for input(s): CHOL, HDL, LDLCALC, TRIG, CHOLHDL, LDLDIRECT in the last 72 hours. Thyroid function studies No results for input(s): TSH, T4TOTAL, T3FREE, THYROIDAB in the last 72 hours.  Invalid input(s): FREET3 Anemia work up No results for input(s): VITAMINB12, FOLATE, FERRITIN, TIBC, IRON, RETICCTPCT in the last 72 hours. Urinalysis No results found for: COLORURINE, APPEARANCEUR, [place], [place], [place], [place], [place], [place], PROTEINUR, UROBILINOGEN, NITRITE, LEUKOCYTESUR Sepsis Labs Invalid input(s): PROCALCITONIN,  WBC,  LACTICIDVEN Microbiology Recent Results (from the past 240 hour(s))  Resp Panel by RT-PCR (Flu A&B, Covid) Nasopharyngeal Swab     Status: None   Collection Time: 04/28/21  1:25 AM   Specimen: Nasopharyngeal Swab;  Nasopharyngeal(NP) swabs in vial transport medium  Result Value Ref Range Status   SARS Coronavirus 2 by RT PCR NEGATIVE NEGATIVE Final    Comment: (NOTE) SARS-CoV-2 target nucleic acids are NOT DETECTED.  The SARS-CoV-2 RNA is generally detectable in upper respiratory specimens during the acute phase of infection. The lowest concentration of SARS-CoV-2 viral copies this assay can detect is 138  copies/mL. A negative result does not preclude SARS-Cov-2 infection and should not be used as the sole basis for treatment or other patient management decisions. A negative result may occur with  improper specimen collection/handling, submission of specimen other than nasopharyngeal swab, presence of viral mutation(s) within the areas targeted by this assay, and inadequate number of viral copies(<138 copies/mL). A negative result must be combined with clinical observations, patient history, and epidemiological information. The expected result is Negative.  Fact Sheet for Patients:  EntrepreneurPulse.com.au  Fact Sheet for Healthcare Providers:  IncredibleEmployment.be  This test is no t yet approved or cleared by the [place] FDA and  has been authorized for detection and/or diagnosis of SARS-CoV-2 by FDA under an Emergency Use Authorization (EUA). This EUA will remain  in effect (meaning this test can be used) for the duration of the COVID-19 declaration under Section 564(b)(1) of the Act, 21 U.S.C.section 360bbb-3(b)(1), unless the authorization is terminated  or revoked sooner.       Influenza A by PCR NEGATIVE NEGATIVE Final   Influenza B by PCR NEGATIVE NEGATIVE Final    Comment: (NOTE) The Xpert Xpress SARS-CoV-2/FLU/RSV plus assay is intended as an aid in the diagnosis of influenza from Nasopharyngeal swab specimens and should not be used as a sole basis for treatment. Nasal washings and aspirates are unacceptable for Xpert Xpress SARS-CoV-2/FLU/RSV testing.  Fact Sheet for Patients: EntrepreneurPulse.com.au  Fact Sheet for Healthcare Providers: IncredibleEmployment.be  This test is not yet approved or cleared by the [place] FDA and has been authorized for detection and/or diagnosis of SARS-CoV-2 by FDA under an Emergency Use Authorization (EUA). This EUA will remain in effect (meaning  this test can be used) for the duration of the COVID-19 declaration under Section 564(b)(1) of the Act, 21 U.S.C. section 360bbb-3(b)(1), unless the authorization is terminated or revoked.  Performed at [facility], [address] [place], [place] [address]   Blood Culture (routine x 2)     Status: None (Preliminary result)   Collection Time: 04/28/21  3:57 AM   Specimen: BLOOD  Result Value Ref Range Status   Specimen Description BLOOD LEFT FA  Final   Special Requests   Final    BOTTLES DRAWN AEROBIC AND ANAEROBIC Blood Culture adequate volume   Culture   Final    NO GROWTH 3 DAYS Performed at [facility], [address], [place], [place] [address]    Report Status PENDING  Incomplete  Blood Culture (routine x 2)     Status: None (Preliminary result)   Collection Time: 04/28/21  3:57 AM   Specimen: BLOOD  Result Value Ref Range Status   Specimen Description BLOOD LEFT HAND  Final   Special Requests   Final    BOTTLES DRAWN AEROBIC AND ANAEROBIC Blood Culture adequate volume   Culture   Final    NO GROWTH 3 DAYS Performed at [facility], [address] [place], [place] [address]    Report Status PENDING  Incomplete  MRSA Next Gen by PCR, Nasal     Status: None   Collection Time:  04/28/21  7:52 AM   Specimen: Nasal Mucosa; Nasal Swab  Result Value Ref Range Status   MRSA by PCR Next Gen NOT DETECTED NOT DETECTED Final    Comment: (NOTE) The GeneXpert MRSA Assay (FDA approved for NASAL specimens only), is one component of a comprehensive MRSA colonization surveillance program. It is not intended to diagnose MRSA infection nor to guide or monitor treatment for MRSA infections. Test performance is not FDA approved in patients less than 4 years old. Performed at [facility], [address] [place], [place] [address]      Time coordinating discharge: Over 30 minutes  SIGNED:   Ezekiel Slocumb, DO [facility] 05/01/2021, 9:50 AM   If 7PM-7AM, please contact night-coverage [email or web address]

## 2021-05-01 NOTE — Progress Notes (Signed)
Patient with LE AVF, cannulated without difficulty, maintains prescribed BFR throughout treatment. Targeted UF met, no complications. Minimal bleed time post tx , monitoring of insertion recommendation. No concerns at this time, patient returned to unit.

## 2021-05-01 NOTE — TOC Transition Note (Addendum)
Transition of Care [facility]) - CM/SW Discharge Note   Patient Details  Name: Doreather Hoxworth MRN: [id number] Date of Birth: 18-Nov-1945  Transition of Care [facility]) CM/SW Contact:  Magnus Ivan, LCSW Phone Number: 05/01/2021, 12:34 PM   Clinical Narrative:   Patient to DC today after dialysis treatment. Notified Jason with [facility]. They will follow for PT and RN services.  1:50- Notified by RN patient needs EMS transport. EMS transportation arranged. EMS paperwork completed. [facility] arranged. Patient is 3rd on the list when a truck is available. RN notified.    Final next level of care: Home w Home Health Services Barriers to Discharge: Barriers Resolved   Patient Goals and CMS Choice   CMS Medicare.gov Compare Post Acute Care list provided to:: Patient Choice offered to / list presented to : Patient  Discharge Placement                    Patient and family notified of of transfer: 05/01/21  Discharge Plan and Services                          HH Arranged: PT, RN [facility] Agency: [facility] ([place]) Date [place]: 05/01/21 Time [place]: 1501 Representative spoke with at [place]: [place] ([facility]) Interventions     Readmission Risk Interventions No flowsheet data found.

## 2021-05-01 NOTE — Progress Notes (Signed)
[facility]  Dialysis Note   Subjective:   Seen and examined on hemodialysis treatment.  Denies any chest pain, shortness of breath Less nausea and appetite has improved.   HEMODIALYSIS FLOWSHEET:  Blood Flow Rate (mL/min): 400 mL/min Arterial Pressure (mmHg): -180 mmHg Venous Pressure (mmHg): 160 mmHg Transmembrane Pressure (mmHg): 70 mmHg Ultrafiltration Rate (mL/min): 670 mL/min Dialysate Flow Rate (mL/min): 500 ml/min Conductivity: Machine : 13.8 Conductivity: Machine : 13.8    Objective:  Vital signs in last 24 hours:  Temp:  [97.4 F (36.3 C)-99.1 F (37.3 C)] 98.3 F (36.8 C) (09/24 0737) Pulse Rate:  [36-91] 77 (09/24 1200) Resp:  [11-23] 16 (09/24 1200) BP: (91-152)/(30-80) 141/75 (09/24 1200) SpO2:  [81 %-100 %] 99 % (09/24 1200) Weight:  [117.8 kg] 117.8 kg (09/24 0600)  Weight change: 5.1 kg Filed Weights   04/29/21 0227 04/30/21 0454 05/01/21 0600  Weight: 112 kg 112.7 kg 117.8 kg    Intake/Output: I/O last 3 completed shifts: In: 21 [P.O.:120; NG/GT:60; IV Piggyback:500] Out: 0    Intake/Output this shift:  Total I/O In: -  Out: -300   Physical Exam: General: NAD,   Head: Normocephalic, atraumatic. Moist oral mucosal membranes  Eyes: Anicteric, PERRL  Neck: Supple, trachea midline  Lungs:  Clear to auscultation  Heart: Regular rate and rhythm  Abdomen:  Soft, nontender,   Extremities:  peripheral edema.  Neurologic: Nonfocal, moving all four extremities  Skin: No lesions  Access:     Basic Metabolic Panel: Recent Labs  Lab 04/27/21 1552 04/29/21 0500 04/30/21 0455 05/01/21 0543  NA 136 136 135 136  K 3.5 4.0 3.4* 4.0  CL 96* 96* 96* 94*  CO2 _0 GLUCOSE 115* 77 88 86  BUN 28* 44* 30* 41*  CREATININE 3.93* 6.07* 4.65* 5.63*  CALCIUM 9.1 9.3 9.2 10.0  MG  --  1.9  --   --     Liver Function Tests: No results for input(s): AST, ALT, ALKPHOS, BILITOT, PROT, ALBUMIN in the last 168 hours. No results  for input(s): LIPASE, AMYLASE in the last 168 hours. No results for input(s): AMMONIA in the last 168 hours.  CBC: Recent Labs  Lab 04/27/21 1552 04/29/21 0500 04/30/21 0455  WBC 4.7 5.8 5.7  HGB 9.7* 10.0* 9.3*  HCT 30.9* 32.0* 29.1*  MCV 101.0* 103.2* 101.7*  PLT 160 190 183    Cardiac Enzymes: No results for input(s): CKTOTAL, CKMB, CKMBINDEX, TROPONINI in the last 168 hours.  BNP: Invalid input(s): POCBNP  CBG: No results for input(s): GLUCAP in the last 168 hours.  Microbiology: Results for orders placed or performed during the hospital encounter of 04/28/21  Resp Panel by RT-PCR (Flu A&B, Covid) Nasopharyngeal Swab     Status: None   Collection Time: 04/28/21  1:25 AM   Specimen: Nasopharyngeal Swab; Nasopharyngeal(NP) swabs in vial transport medium  Result Value Ref Range Status   SARS Coronavirus 2 by RT PCR NEGATIVE NEGATIVE Final    Comment: (NOTE) SARS-CoV-2 target nucleic acids are NOT DETECTED.  The SARS-CoV-2 RNA is generally detectable in upper respiratory specimens during the acute phase of infection. The lowest concentration of SARS-CoV-2 viral copies this assay can detect is 138 copies/mL. A negative result does not preclude SARS-Cov-2 infection and should not be used as the sole basis for treatment or other patient management decisions. A negative result may occur with  improper specimen collection/handling, submission of specimen other than nasopharyngeal swab, presence of viral mutation(s)  within the areas targeted by this assay, and inadequate number of viral copies(<138 copies/mL). A negative result must be combined with clinical observations, patient history, and epidemiological information. The expected result is Negative.  Fact Sheet for Patients:  EntrepreneurPulse.com.au  Fact Sheet for Healthcare Providers:  IncredibleEmployment.be  This test is no t yet approved or cleared by the [place] FDA  and  has been authorized for detection and/or diagnosis of SARS-CoV-2 by FDA under an Emergency Use Authorization (EUA). This EUA will remain  in effect (meaning this test can be used) for the duration of the COVID-19 declaration under Section 564(b)(1) of the Act, 21 U.S.C.section 360bbb-3(b)(1), unless the authorization is terminated  or revoked sooner.       Influenza A by PCR NEGATIVE NEGATIVE Final   Influenza B by PCR NEGATIVE NEGATIVE Final    Comment: (NOTE) The Xpert Xpress SARS-CoV-2/FLU/RSV plus assay is intended as an aid in the diagnosis of influenza from Nasopharyngeal swab specimens and should not be used as a sole basis for treatment. Nasal washings and aspirates are unacceptable for Xpert Xpress SARS-CoV-2/FLU/RSV testing.  Fact Sheet for Patients: EntrepreneurPulse.com.au  Fact Sheet for Healthcare Providers: IncredibleEmployment.be  This test is not yet approved or cleared by the [place] FDA and has been authorized for detection and/or diagnosis of SARS-CoV-2 by FDA under an Emergency Use Authorization (EUA). This EUA will remain in effect (meaning this test can be used) for the duration of the COVID-19 declaration under Section 564(b)(1) of the Act, 21 U.S.C. section 360bbb-3(b)(1), unless the authorization is terminated or revoked.  Performed at [facility], [address] [place], [place] [address]   Blood Culture (routine x 2)     Status: None (Preliminary result)   Collection Time: 04/28/21  3:57 AM   Specimen: BLOOD  Result Value Ref Range Status   Specimen Description BLOOD LEFT FA  Final   Special Requests   Final    BOTTLES DRAWN AEROBIC AND ANAEROBIC Blood Culture adequate volume   Culture   Final    NO GROWTH 3 DAYS Performed at [facility], [address], [place], [place] [address]    Report Status PENDING  Incomplete  Blood Culture (routine x 2)     Status: None  (Preliminary result)   Collection Time: 04/28/21  3:57 AM   Specimen: BLOOD  Result Value Ref Range Status   Specimen Description BLOOD LEFT HAND  Final   Special Requests   Final    BOTTLES DRAWN AEROBIC AND ANAEROBIC Blood Culture adequate volume   Culture   Final    NO GROWTH 3 DAYS Performed at [facility], [address] [place], [place] [address]    Report Status PENDING  Incomplete  MRSA Next Gen by PCR, Nasal     Status: None   Collection Time: 04/28/21  7:52 AM   Specimen: Nasal Mucosa; Nasal Swab  Result Value Ref Range Status   MRSA by PCR Next Gen NOT DETECTED NOT DETECTED Final    Comment: (NOTE) The GeneXpert MRSA Assay (FDA approved for NASAL specimens only), is one component of a comprehensive MRSA colonization surveillance program. It is not intended to diagnose MRSA infection nor to guide or monitor treatment for MRSA infections. Test performance is not FDA approved in patients less than 57 years old. Performed at [facility], [address] [place],  [address]     Coagulation Studies: No results for input(s): LABPROT, INR in the last  72 hours.  Urinalysis: No results for input(s): COLORURINE, LABSPEC, PHURINE, GLUCOSEU, HGBUR, BILIRUBINUR, KETONESUR, PROTEINUR, UROBILINOGEN, NITRITE, LEUKOCYTESUR in the last 72 hours.  Invalid input(s): APPERANCEUR    Imaging: No results found.   Medications:     apixaban  2.5 mg Oral BID   atorvastatin  80 mg Oral Daily   cefdinir  300 mg Oral Q12H   Chlorhexidine Gluconate Cloth  6 each Topical Q0600   feeding supplement (NEPRO CARB STEADY)  237 mL Oral TID BM   fluticasone  1-2 spray Each Nare Q1200   gabapentin  300 mg Oral TID   hydroxychloroquine  200 mg Oral Daily   multivitamin  1 tablet Oral QHS   sevelamer carbonate  1,600 mg Oral 2 times per day   sevelamer carbonate  2,400 mg Oral TID WC   acetaminophen **OR** acetaminophen, HYDROcodone-acetaminophen, lactulose,  ondansetron **OR** ondansetron (ZOFRAN) IV  Assessment/ Plan:  Cassandra Joseph is a 75 y.o.  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  Intake/Output Summary (Last 24 hours) at 05/01/2021 1338 Last data filed at 05/01/2021 1200 Gross per 24 hour  Intake 180 ml  Output -300 ml  Net 480 ml    2. Hypertension with chronic kidney disease:    BP (!) 141/75   Pulse 77   Temp 98.3 F (36.8 C)   Resp 16   Ht _0  (1.676 m)   Wt 117.8 kg   SpO2 99%   BMI 41.92 kg/m   3. Anemia of chronic kidney disease/ kidney injury/chronic disease/acute blood loss:   Lab Results  Component Value Date   HGB 9.3 (L) 04/30/2021  Continue to monitor and follow protocols.  4. Secondary Hyperparathyroidism:     Lab Results  Component Value Date   CALCIUM 10.0 05/01/2021   We will check PTH as outpatient.  #5: Volume overload/congestive heart failure: Has significantly improved with dialysis.  Patient is advised on the importance of fluid restriction.  Patient can be discharged home after dialysis today if stable.    LOS: [place], MD [facility] 9/24/20221:38 PM

## 2021-05-03 ENCOUNTER — Encounter: Payer: Self-pay | Admitting: Emergency Medicine

## 2021-05-03 ENCOUNTER — Encounter: Payer: Self-pay | Admitting: Internal Medicine

## 2021-05-03 LAB — CULTURE, BLOOD (ROUTINE X 2)
Culture: NO GROWTH
Culture: NO GROWTH
Special Requests: ADEQUATE
Special Requests: ADEQUATE

## 2021-05-18 ENCOUNTER — Emergency Department: Payer: Medicare Other

## 2021-05-18 ENCOUNTER — Inpatient Hospital Stay
Admission: EM | Admit: 2021-05-18 | Discharge: 2021-05-22 | DRG: 640 | Disposition: A | Discharge status: Home Health | Payer: Medicare Other | Source: Ambulatory Visit | Attending: Internal Medicine | Admitting: Internal Medicine

## 2021-05-18 DIAGNOSIS — E8770 Fluid overload, unspecified: Secondary | ICD-10-CM | POA: Diagnosis not present

## 2021-05-18 DIAGNOSIS — C9000 Multiple myeloma not having achieved remission: Secondary | ICD-10-CM | POA: Diagnosis present

## 2021-05-18 DIAGNOSIS — Z9221 Personal history of antineoplastic chemotherapy: Secondary | ICD-10-CM

## 2021-05-18 DIAGNOSIS — Z20822 Contact with and (suspected) exposure to covid-19: Secondary | ICD-10-CM | POA: Diagnosis present

## 2021-05-18 DIAGNOSIS — E8779 Other fluid overload: Secondary | ICD-10-CM | POA: Diagnosis not present

## 2021-05-18 DIAGNOSIS — Z90722 Acquired absence of ovaries, bilateral: Secondary | ICD-10-CM

## 2021-05-18 DIAGNOSIS — N2581 Secondary hyperparathyroidism of renal origin: Secondary | ICD-10-CM | POA: Diagnosis present

## 2021-05-18 DIAGNOSIS — Z8673 Personal history of transient ischemic attack (TIA), and cerebral infarction without residual deficits: Secondary | ICD-10-CM

## 2021-05-18 DIAGNOSIS — M109 Gout, unspecified: Secondary | ICD-10-CM | POA: Diagnosis present

## 2021-05-18 DIAGNOSIS — M059 Rheumatoid arthritis with rheumatoid factor, unspecified: Secondary | ICD-10-CM | POA: Diagnosis present

## 2021-05-18 DIAGNOSIS — Z79899 Other long term (current) drug therapy: Secondary | ICD-10-CM

## 2021-05-18 DIAGNOSIS — E782 Mixed hyperlipidemia: Secondary | ICD-10-CM

## 2021-05-18 DIAGNOSIS — C9002 Multiple myeloma in relapse: Secondary | ICD-10-CM

## 2021-05-18 DIAGNOSIS — E785 Hyperlipidemia, unspecified: Secondary | ICD-10-CM | POA: Diagnosis present

## 2021-05-18 DIAGNOSIS — N189 Chronic kidney disease, unspecified: Secondary | ICD-10-CM

## 2021-05-18 DIAGNOSIS — Z8249 Family history of ischemic heart disease and other diseases of the circulatory system: Secondary | ICD-10-CM

## 2021-05-18 DIAGNOSIS — Z862 Personal history of diseases of the blood and blood-forming organs and certain disorders involving the immune mechanism: Secondary | ICD-10-CM

## 2021-05-18 DIAGNOSIS — R34 Anuria and oliguria: Secondary | ICD-10-CM | POA: Diagnosis present

## 2021-05-18 DIAGNOSIS — I10 Essential (primary) hypertension: Secondary | ICD-10-CM | POA: Diagnosis present

## 2021-05-18 DIAGNOSIS — R778 Other specified abnormalities of plasma proteins: Secondary | ICD-10-CM | POA: Diagnosis not present

## 2021-05-18 DIAGNOSIS — Z7901 Long term (current) use of anticoagulants: Secondary | ICD-10-CM

## 2021-05-18 DIAGNOSIS — D631 Anemia in chronic kidney disease: Secondary | ICD-10-CM | POA: Diagnosis present

## 2021-05-18 DIAGNOSIS — E8881 Metabolic syndrome: Secondary | ICD-10-CM | POA: Diagnosis present

## 2021-05-18 DIAGNOSIS — Z91013 Allergy to seafood: Secondary | ICD-10-CM

## 2021-05-18 DIAGNOSIS — Z89422 Acquired absence of other left toe(s): Secondary | ICD-10-CM

## 2021-05-18 DIAGNOSIS — N186 End stage renal disease: Secondary | ICD-10-CM | POA: Diagnosis present

## 2021-05-18 DIAGNOSIS — J9611 Chronic respiratory failure with hypoxia: Secondary | ICD-10-CM | POA: Diagnosis not present

## 2021-05-18 DIAGNOSIS — R0602 Shortness of breath: Secondary | ICD-10-CM | POA: Diagnosis present

## 2021-05-18 DIAGNOSIS — G4733 Obstructive sleep apnea (adult) (pediatric): Secondary | ICD-10-CM | POA: Diagnosis present

## 2021-05-18 DIAGNOSIS — I700 Atherosclerosis of aorta: Secondary | ICD-10-CM | POA: Diagnosis present

## 2021-05-18 DIAGNOSIS — I5032 Chronic diastolic (congestive) heart failure: Secondary | ICD-10-CM | POA: Diagnosis present

## 2021-05-18 DIAGNOSIS — I2510 Atherosclerotic heart disease of native coronary artery without angina pectoris: Secondary | ICD-10-CM | POA: Diagnosis present

## 2021-05-18 DIAGNOSIS — Z992 Dependence on renal dialysis: Secondary | ICD-10-CM

## 2021-05-18 DIAGNOSIS — I480 Paroxysmal atrial fibrillation: Secondary | ICD-10-CM | POA: Diagnosis present

## 2021-05-18 DIAGNOSIS — Z8616 Personal history of COVID-19: Secondary | ICD-10-CM

## 2021-05-18 DIAGNOSIS — Z6841 Body Mass Index (BMI) 40.0 and over, adult: Secondary | ICD-10-CM

## 2021-05-18 DIAGNOSIS — I132 Hypertensive heart and chronic kidney disease with heart failure and with stage 5 chronic kidney disease, or end stage renal disease: Secondary | ICD-10-CM | POA: Diagnosis present

## 2021-05-18 DIAGNOSIS — K5900 Constipation, unspecified: Secondary | ICD-10-CM | POA: Diagnosis present

## 2021-05-18 DIAGNOSIS — Z9989 Dependence on other enabling machines and devices: Secondary | ICD-10-CM

## 2021-05-18 DIAGNOSIS — J9621 Acute and chronic respiratory failure with hypoxia: Secondary | ICD-10-CM | POA: Diagnosis present

## 2021-05-18 DIAGNOSIS — Z9981 Dependence on supplemental oxygen: Secondary | ICD-10-CM

## 2021-05-18 DIAGNOSIS — Z90710 Acquired absence of both cervix and uterus: Secondary | ICD-10-CM

## 2021-05-18 DIAGNOSIS — Z87891 Personal history of nicotine dependence: Secondary | ICD-10-CM

## 2021-05-18 LAB — CBC WITH DIFFERENTIAL/PLATELET
Abs Immature Granulocytes: 0.01 10*3/uL (ref 0.00–0.07)
Basophils Absolute: 0.1 10*3/uL (ref 0.0–0.1)
Basophils Relative: 1 %
Eosinophils Absolute: 0.3 10*3/uL (ref 0.0–0.5)
Eosinophils Relative: 4 %
HCT: 29.2 % — ABNORMAL LOW (ref 36.0–46.0)
Hemoglobin: 9.6 g/dL — ABNORMAL LOW (ref 12.0–15.0)
Immature Granulocytes: 0 %
Lymphocytes Relative: 19 %
Lymphs Abs: 1.2 10*3/uL (ref 0.7–4.0)
MCH: 32.9 pg (ref 26.0–34.0)
MCHC: 32.9 g/dL (ref 30.0–36.0)
MCV: 100 fL (ref 80.0–100.0)
Monocytes Absolute: 0.6 10*3/uL (ref 0.1–1.0)
Monocytes Relative: 10 %
Neutro Abs: 4.1 10*3/uL (ref 1.7–7.7)
Neutrophils Relative %: 66 %
Platelets: 174 10*3/uL (ref 150–400)
RBC: 2.92 MIL/uL — ABNORMAL LOW (ref 3.87–5.11)
RDW: 16.6 % — ABNORMAL HIGH (ref 11.5–15.5)
WBC: 6.3 10*3/uL (ref 4.0–10.5)
nRBC: 0 % (ref 0.0–0.2)

## 2021-05-18 LAB — COMPREHENSIVE METABOLIC PANEL
ALT: 12 U/L (ref 0–44)
AST: 12 U/L — ABNORMAL LOW (ref 15–41)
Albumin: 3.5 g/dL (ref 3.5–5.0)
Alkaline Phosphatase: 54 U/L (ref 38–126)
Anion gap: 11 (ref 5–15)
BUN: 45 mg/dL — ABNORMAL HIGH (ref 8–23)
CO2: 33 mmol/L — ABNORMAL HIGH (ref 22–32)
Calcium: 10.3 mg/dL (ref 8.9–10.3)
Chloride: 95 mmol/L — ABNORMAL LOW (ref 98–111)
Creatinine, Ser: 6.44 mg/dL — ABNORMAL HIGH (ref 0.44–1.00)
GFR, Estimated: 6 mL/min — ABNORMAL LOW (ref >60)
Glucose, Bld: 84 mg/dL (ref 70–99)
Potassium: 4.1 mmol/L (ref 3.5–5.1)
Sodium: 139 mmol/L (ref 135–145)
Total Bilirubin: 0.9 mg/dL (ref 0.3–1.2)
Total Protein: 7.9 g/dL (ref 6.5–8.1)

## 2021-05-18 LAB — RESP PANEL BY RT-PCR (FLU A&B, COVID) ARPGX2
Influenza A by PCR: NEGATIVE
Influenza B by PCR: NEGATIVE
SARS Coronavirus 2 by RT PCR: NEGATIVE

## 2021-05-18 LAB — PROCALCITONIN: Procalcitonin: 0.32 ng/mL

## 2021-05-18 LAB — BRAIN NATRIURETIC PEPTIDE: B Natriuretic Peptide: 1458.4 pg/mL — ABNORMAL HIGH (ref 0.0–100.0)

## 2021-05-18 LAB — TROPONIN I (HIGH SENSITIVITY): Troponin I (High Sensitivity): 68 ng/L — ABNORMAL HIGH (ref <18)

## 2021-05-18 LAB — PHOSPHORUS: Phosphorus: 4.2 mg/dL (ref 2.5–4.6)

## 2021-05-18 LAB — MAGNESIUM: Magnesium: 2.2 mg/dL (ref 1.7–2.4)

## 2021-05-18 MED ORDER — CINACALCET HCL 30 MG PO TABS
60.0000 mg | ORAL_TABLET | Freq: Two times a day (BID) | ORAL | Status: DC
  Administered 2021-05-19 – 2021-05-21 (×4): 60 mg via ORAL
  Filled 2021-05-18 (×11): qty 2

## 2021-05-18 MED ORDER — POLYETHYLENE GLYCOL 3350 17 G PO PACK: 17.0000 g | PACK | Freq: Every day | ORAL | Status: DC

## 2021-05-18 MED ORDER — SEVELAMER CARBONATE 800 MG PO TABS
1600.0000 mg | ORAL_TABLET | Freq: Three times a day (TID) | ORAL | Status: DC
Start: 2021-05-18 — End: 2021-05-20
  Administered 2021-05-19: 1600 mg via ORAL
  Filled 2021-05-18 (×5): qty 3

## 2021-05-18 MED ORDER — DOCUSATE SODIUM 100 MG PO CAPS
100.0000 mg | ORAL_CAPSULE | Freq: Two times a day (BID) | ORAL | Status: DC
  Administered 2021-05-19 – 2021-05-20 (×4): 100 mg via ORAL
  Filled 2021-05-18 (×8): qty 1

## 2021-05-18 MED ORDER — ATORVASTATIN CALCIUM 20 MG PO TABS
80.0000 mg | ORAL_TABLET | Freq: Every day | ORAL | Status: DC
  Administered 2021-05-19 – 2021-05-22 (×4): 80 mg via ORAL
  Filled 2021-05-18 (×4): qty 4

## 2021-05-18 MED ORDER — FLUTICASONE PROPIONATE 50 MCG/ACT NA SUSP
1.0000 | Freq: Every day | NASAL | Status: DC
  Administered 2021-05-19: 1 via NASAL
  Administered 2021-05-20: 2 via NASAL
  Administered 2021-05-22: 1 via NASAL
  Filled 2021-05-18: qty 16

## 2021-05-18 MED ORDER — ONDANSETRON HCL 4 MG/2ML IJ SOLN
4.0000 mg | Freq: Four times a day (QID) | INTRAMUSCULAR | Status: DC | PRN
Start: 2021-05-18 — End: 2021-05-22

## 2021-05-18 MED ORDER — HEPARIN SODIUM (PORCINE) 5000 UNIT/ML IJ SOLN: 5000.0000 [IU] | Freq: Three times a day (TID) | INTRAMUSCULAR | Status: DC

## 2021-05-18 MED ORDER — ACETAMINOPHEN 325 MG PO TABS: 650.0000 mg | ORAL_TABLET | Freq: Four times a day (QID) | ORAL | Status: AC | PRN

## 2021-05-18 MED ORDER — CARVEDILOL 6.25 MG PO TABS
25.0000 mg | ORAL_TABLET | Freq: Two times a day (BID) | ORAL | Status: DC
  Administered 2021-05-19: 6.25 mg via ORAL
  Filled 2021-05-18 (×2): qty 4

## 2021-05-18 MED ORDER — HYDROCODONE-ACETAMINOPHEN 5-325 MG PO TABS
1.0000 | ORAL_TABLET | Freq: Two times a day (BID) | ORAL | Status: AC | PRN
Start: 2021-05-18 — End: 2021-05-19

## 2021-05-18 MED ORDER — HYDROXYCHLOROQUINE SULFATE 200 MG PO TABS
200.0000 mg | ORAL_TABLET | Freq: Every day | ORAL | Status: DC
  Administered 2021-05-19 – 2021-05-22 (×4): 200 mg via ORAL
  Filled 2021-05-18 (×4): qty 1

## 2021-05-18 MED ORDER — ACETAMINOPHEN 650 MG RE SUPP
650.0000 mg | Freq: Four times a day (QID) | RECTAL | Status: AC | PRN
  Filled 2021-05-18: qty 1

## 2021-05-18 MED ORDER — POLYETHYLENE GLYCOL 3350 17 G PO PACK
17.0000 g | PACK | Freq: Two times a day (BID) | ORAL | Status: DC
  Administered 2021-05-19 – 2021-05-20 (×4): 17 g via ORAL
  Filled 2021-05-18 (×7): qty 1

## 2021-05-18 MED ORDER — DIPHENHYDRAMINE HCL 50 MG/ML IJ SOLN
INTRAMUSCULAR | Status: AC
  Filled 2021-05-18: qty 1

## 2021-05-18 MED ORDER — APIXABAN 2.5 MG PO TABS
2.5000 mg | ORAL_TABLET | Freq: Two times a day (BID) | ORAL | Status: DC
  Administered 2021-05-19 – 2021-05-21 (×7): 2.5 mg via ORAL
  Filled 2021-05-18 (×10): qty 1

## 2021-05-18 MED ORDER — CHLORHEXIDINE GLUCONATE CLOTH 2 % EX PADS
6.0000 | MEDICATED_PAD | Freq: Every day | CUTANEOUS | Status: DC
  Administered 2021-05-19 – 2021-05-21 (×3): 6 via TOPICAL
  Filled 2021-05-18 (×2): qty 6

## 2021-05-18 MED ORDER — ONDANSETRON HCL 4 MG PO TABS
4.0000 mg | ORAL_TABLET | Freq: Four times a day (QID) | ORAL | Status: DC | PRN
Start: 2021-05-18 — End: 2021-05-22

## 2021-05-18 MED ORDER — ASPIRIN EC 81 MG PO TBEC
81.0000 mg | DELAYED_RELEASE_TABLET | Freq: Every day | ORAL | Status: DC
  Administered 2021-05-19 – 2021-05-22 (×4): 81 mg via ORAL
  Filled 2021-05-18 (×4): qty 1

## 2021-05-18 MED ORDER — BISACODYL 10 MG RE SUPP
10.0000 mg | Freq: Every day | RECTAL | Status: DC | PRN
  Filled 2021-05-18: qty 1

## 2021-05-18 MED ORDER — MIDODRINE HCL 5 MG PO TABS: 10.0000 mg | ORAL_TABLET | Freq: Three times a day (TID) | ORAL | Status: DC | PRN

## 2021-05-18 MED ORDER — AMLODIPINE BESYLATE 5 MG PO TABS
10.0000 mg | ORAL_TABLET | Freq: Every day | ORAL | Status: DC
  Filled 2021-05-18: qty 2

## 2021-05-18 MED ORDER — SENNA 8.6 MG PO TABS
2.0000 | ORAL_TABLET | Freq: Every day | ORAL | Status: DC
  Administered 2021-05-19 (×2): 17.2 mg via ORAL
  Filled 2021-05-18 (×4): qty 2

## 2021-05-18 MED ORDER — EPOETIN ALFA 4000 UNIT/ML IJ SOLN
4000.0000 [IU] | INTRAMUSCULAR | Status: DC
  Administered 2021-05-20 – 2021-05-22 (×2): 4000 [IU] via INTRAVENOUS
  Filled 2021-05-18 (×2): qty 1

## 2021-05-18 NOTE — ED Triage Notes (Signed)
Pt arrives via EMS from [facility] for SOB. PT sts that she has been SOB for the last few days, especially laying down. Pt did not complete her dialysis today. PT last completed dialysis on Saturday 05/15/2021.

## 2021-05-18 NOTE — H&P (Addendum)
History and Physical   KHARTER SESTAK BSJ:[id number] DOB: 1946-03-19 DOA: 05/18/2021  PCP: Tracie Harrier, MD  Outpatient Specialists: Dr. Rogue Bussing, medical oncology Patient coming from: Hemodialysis center  I have personally briefly reviewed patient's old medical records in [place].  Chief Concern: Shortness of breath  HPI: Cassandra Joseph is a 75 y.o. female with medical history significant for hypertension, hyperlipidemia, morbid obesity, OSA on CPAP, end-stage renal disease on hemodialysis Tuesday Thursday Saturday, paroxysmal atrial fibrillation on Eliquis twice daily, who presents to the emergency department for chief concerns of shortness of breath.  Patient was at hemodialysis center.  She reported to dialysis nurse that she was short of breath.  Hemodialysis center sent patient to the emergency department for further evaluation and a dialysis session was not completed at the hemodialysis center.  Patient reports compliance with hemodialysis and her last session was on Saturday, 05/15/2021.  She reports that the full amount was removed and she received a full dialysis treatment at that time.  The shortness of breat started Saturday evening. She denies cough. The shortness is worse with exertion and laying flat. She denies fever. She endorses some pleghm that is clear. She denies chest pain, abdominal pain. She endorses consitpation and denies diarrhea and nausea. She denies blood in her stool and vomtiing. Her last bowel movement was Sunday.   She does not make urine.   Social history: She lives with daughter. She denies tobacco, etoh, recreational use. She is retired and formerly worked as a Educational psychologist.   Vaccination history: She is vaccinated for covid 19, 3 doses of Moderna.   ROS: Constitutional: no weight change, no fever ENT/Mouth: no sore throat, no rhinorrhea Eyes: no eye pain, no vision changes Cardiovascular: no chest pain, + dyspnea,  no edema, no  palpitations Respiratory: no cough, no sputum, no wheezing Gastrointestinal: + nausea, no vomiting, no diarrhea, no constipation Genitourinary: no urinary incontinence, no dysuria, no hematuria Musculoskeletal: no arthralgias, no myalgias Skin: no skin lesions, no pruritus, Neuro: + weakness, no loss of consciousness, no syncope Psych: no anxiety, no depression, + decrease appetite Heme/Lymph: no bruising, no bleeding  ED Course: Discussed with emergency medicine provider, patient requiring hospitalization for chief concerns of shortness of breath presumed secondary to volume overload.  Vitals in the emergency department was remarkable for temperature 98, respiration rate of 25, heart rate is 75, initial blood pressure 134/62, SPO2 of 92% on 4 L nasal cannula.  Labs in the emergency department was remarkable for sodium 139, potassium 4.1, chloride 95, bicarb 33, BUN of 45, serum creatinine of 6.44, EGFR 6.  BNP was elevated at 1458.  High sensitive troponin is 68.  WBC 6.3, hemoglobin 9.6, platelets 174.  No treatment performed in the emergency department.  Assessment/Plan  Principal Problem:   Volume overload Active Problems:   ESRD on hemodialysis (HCC)   Essential hypertension   Multiple myeloma (HCC)   Elevated troponin   HLD (hyperlipidemia)   SOB (shortness of breath)   History of anemia due to chronic kidney disease   PAF (paroxysmal atrial fibrillation) (HCC)   Chronic respiratory failure with hypoxia (HCC)   OSA on CPAP   Obesity, Class III, BMI 40-49.9 (morbid obesity) ([place])   # Shortness of breath-etiology work-up in progress - Differentials include volume overloaded in setting of end-stage renal disease requiring hemodialysis - Check procalcitonin - Check COVID/influenza A/influenza B PCR - Continuous BiPAP ordered as a temporary measure pending hemodialysis - If patient's blood pressure  continues to increase, we will order nitroglycerin gtt. in order to reduce  preload - EDP spoke with nephrologist, Dr. Candiss Norse who states he will hemodialyzed the patient today - Patient may need another session on 05/19/2021  # End-stage renal disease on hemodialysis-treat as above - Patient is auric - Resumed Cinacalcet 60 mg p.o. twice daily, sevelamer home dose - Nephrology has been consulted and states has plans for hemodialysis  # Elevated troponin-presumed secondary to chronically hemodialysis dependent - I have no clinical suspicion for ACS at this time as EKG did not show evidence of ischemia and patient denies chest pain  # OSA-BiPAP nightly ordered # Paroxysmal atrial fibrillation-apixaban 2.5 mg p.o. twice daily # Hyperlipidemia-atorvastatin 80 mg nightly # Hypertension-resumed home carvedilol 25 mg p.o. twice daily, amlodipine 10 mg daily # History of multiple myeloma-recommend follow-up with outpatient medical oncologist, Dr. Rogue Bussing # History of rheumatoid arthritis, Seropositive-patient takes hydroxychloroquine 200 mg p.o. daily  Chart reviewed.   DVT prophylaxis: Eliquis 2.5 mg p.o. 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 on HD x 11 years; b. felt to be 2/2 HTN; c. HD TTS   History of nuclear stress test    a. 08/2015: probably normal myocardial perfusion study, no evidence for significant ischemia or scar was noted, during stress, global systolic function normal, EF 54%, RV borderline dilatation, coronary artery calcifications and marked mitral annular calcifications were noted, liver calcifications were noted on attenuation CT scan likely represent sequelae  of previous granulomatous disease   HLD (hyperlipidemia)    Hypertension    Ischemic stroke ([place])    a. 12/7320   Metabolic syndrome    Morbid obesity ([place])    Multiple myeloma ([place])    a. in remission   Osteomyelitis ([place])    a. 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 2014   Past Surgical History:  Procedure Laterality Date   ABDOMINAL HYSTERECTOMY     APPENDECTOMY     eyelid surgery     HERNIA REPAIR     right knee surgery     Social History:  reports that she has quit smoking. Her smoking use included cigarettes. She has never used smokeless tobacco. She reports that she does not drink alcohol and does not use drugs.  Allergies  Allergen Reactions   Shellfish Allergy Shortness Of Breath   Shellfish Allergy Other (See Comments)    Unknown reaction   Family History  Problem Relation Age of Onset   Seizures Mother    Hypertension Mother    Throat cancer Father    Breast cancer Neg Hx    Family history: Family history reviewed and not pertinent  Prior to Admission medications   Medication Sig Start Date End Date Taking? Authorizing Provider  amLODipine (NORVASC) 10 MG tablet Take 1 tablet by mouth daily. 11/27/20 11/27/21  [provider]  amLODipine (NORVASC) 10 MG tablet Take 10 mg by mouth daily. 02/03/21   [provider]  apixaban (ELIQUIS) 2.5 MG TABS tablet  Take 1 tablet (2.5 mg total) by mouth 2 (two) times daily. 06/28/19   Lavina Hamman, MD  atorvastatin (LIPITOR) 80 MG tablet Take 1 tablet (80 mg total) by mouth daily. 06/29/19   Lavina Hamman, MD  atorvastatin (LIPITOR) 80 MG tablet Take 80 mg by mouth daily. 03/28/21   [provider]  b complex-vitamin c-folic acid (NEPHRO-VITE) 0.8 MG TABS tablet Take 1 tablet by mouth daily. 09/27/16   [provider]  b complex-vitamin c-folic acid (NEPHRO-VITE) 0.8 MG TABS tablet Take 1 tablet by mouth  daily. 02/28/21   [provider]  bisacodyl (DULCOLAX) 10 MG suppository Place 1 suppository (10 mg total) rectally daily as needed for moderate constipation. 10/05/20   Cherene Altes, MD  carvedilol (COREG) 25 MG tablet Take 25 mg by mouth 2 (two) times daily. 02/03/21   [provider]  carvedilol (COREG) 25 MG tablet Take 25 mg by mouth 2 (two) times daily. 02/03/21   [provider]  cefdinir (OMNICEF) 300 MG capsule Take 1 capsule (300 mg total) by mouth 2 (two) times daily. 05/01/21   Ezekiel Slocumb, DO  cinacalcet (SENSIPAR) 60 MG tablet Take 60 mg by mouth 2 (two) times daily.    [provider]  docusate sodium (COLACE) 100 MG capsule Take 100 mg by mouth 2 (two) times daily. 03/08/21   [provider]  ELIQUIS 2.5 MG TABS tablet Take 2.5 mg by mouth 2 (two) times daily. 04/04/21   [provider]  epoetin alfa (EPOGEN) 4000 UNIT/ML injection Inject 1 mL (4,000 Units total) into the vein Every Tuesday,Thursday,and Saturday with dialysis. 10/05/20   Cherene Altes, MD  fluticasone (FLONASE) 50 MCG/ACT nasal spray Place 1-2 sprays into both nostrils daily at 12 noon. 12/26/20   [provider]  gabapentin (NEURONTIN) 300 MG capsule Take 300 mg by mouth 3 (three) times daily. 07/27/20   [provider]  gabapentin (NEURONTIN) 300 MG capsule Take 300 mg by mouth 3 (three) times daily. 03/31/21   [provider]  HYDROcodone-acetaminophen (NORCO/VICODIN) 5-325 MG tablet Take 1 tablet by mouth 2 (two) times daily as needed for pain. 02/08/21   [provider]  hydrocortisone cream 1 % Apply 1 application topically 2 (two) times daily. 11/24/20   Blake Divine, MD  Hydrocortisone, Perianal, 1 % CREA Apply topically. 11/24/20   [provider]  hydroxychloroquine (PLAQUENIL) 200 MG tablet Take 200 mg by mouth daily. 06/30/20   [provider]  hydroxychloroquine (PLAQUENIL) 200 MG tablet Take  200 mg by mouth daily. 04/14/21   [provider]  midodrine (PROAMATINE) 10 MG tablet Take 1 tablet (10 mg total) by mouth 3 (three) times daily with meals. 06/28/19   Lavina Hamman, MD  midodrine (PROAMATINE) 10 MG tablet Take 10 mg by mouth 3 (three) times daily. 04/22/21   [provider]  Nutritional Supplements (FEEDING SUPPLEMENT, NEPRO CARB STEADY,) LIQD Take 237 mLs by mouth 3 (three) times daily between meals. 05/01/21   Ezekiel Slocumb, DO  polyethylene glycol (MIRALAX / GLYCOLAX) 17 g packet Take 17 g by mouth 2 (two) times daily. 10/05/20   Cherene Altes, MD  senna (SENOKOT) 8.6 MG tablet Take 2 tablets by mouth at bedtime. 11/27/20   [provider]  sevelamer carbonate (RENVELA) 800 MG tablet Take 800 mg by mouth 4 (four) times daily.    [provider]  sevelamer carbonate (RENVELA) 800 MG tablet Take 1,600-2,400 mg  by mouth 3 (three) times daily. Take 3 tabs with meals and 2 tabs with snacks 03/16/21   [provider]   Physical Exam: Vitals:   05/18/21 1245 05/18/21 1300 05/18/21 1315 05/18/21 1330  BP:  (!) 150/62  139/76  Pulse: 81  76 76  Resp: (!) 22 (!) 24 (!) 21 (!) 23  Temp:      TempSrc:      SpO2: 90%  (!) 89% 91%  Weight:       Constitutional: appears age-appropriate, frail, chronically ill, NAD, calm, comfortable Eyes: PERRL, lids and conjunctivae normal ENMT: Mucous membranes are moist. Posterior pharynx clear of any exudate or lesions. Age-appropriate dentition. Hearing appropriate Neck: normal, supple, no masses, no thyromegaly Respiratory: Diffuse generalized reduced lung sounds on auscultation, no crackles. Normal respiratory effort. No accessory muscle use.  Cardiovascular: Regular rate and rhythm, no murmurs / rubs / gallops. No extremity edema. 2+ pedal pulses. No carotid bruits.  Abdomen: Obese abdomen, no tenderness, no masses palpated, no hepatosplenomegaly. Bowel sounds positive.  Musculoskeletal: no  clubbing / cyanosis. No joint deformity upper and lower extremities. Good ROM, no contractures, no atrophy. Normal muscle tone.  Skin: no rashes, lesions, ulcers. No induration Neurologic: Sensation intact. Strength 5/5 in all 4.  Psychiatric: Normal judgment and insight. Alert and oriented x 3. Normal mood.   EKG: independently reviewed, showing atrial flutter with rate of 75, QTc 483  Chest x-ray on Admission: I personally reviewed and I agree with radiologist reading as below.  DG Chest Portable 1 View  Result Date: 05/18/2021 CLINICAL DATA:  Shortness of breath. Additional history provided: Patient reports shortness of breath for the last few days, patient transferred via EMS from dialysis, patient did not complete dialysis today,. EXAM: PORTABLE CHEST 1 VIEW COMPARISON:  Prior chest radiographs 04/24/2021 and earlier. PET-CT 12/09/2020. FINDINGS: Cardiomegaly, unchanged. Aortic atherosclerosis. Interstitial edema, new as compared to the chest radiographs of 04/24/2021. Chronic elevation of the right hemidiaphragm. Bibasilar opacities, increased on the right. These opacities may reflect atelectasis, scarring and/or pneumonia. Suspected small right pleural effusion. No evidence of pneumothorax. No acute bony abnormality identified. IMPRESSION: Redemonstrated cardiomegaly. Interstitial edema, new from the prior chest radiographs of 04/24/2021. Bibasilar opacities, increased on the right. These opacities may reflect atelectasis, scarring and/or pneumonia. Suspected small right pleural effusion. Chronic elevation of the right hemidiaphragm. Aortic Atherosclerosis (ICD10-I70.0). Electronically Signed   By: Kellie Simmering D.O.   On: 05/18/2021 12:38    Labs on Admission: I have personally reviewed following labs CBC: Recent Labs  Lab 05/18/21 1225  WBC 6.3  NEUTROABS 4.1  HGB 9.6*  HCT 29.2*  MCV 100.0  PLT 962   Basic Metabolic Panel: Recent Labs  Lab 05/18/21 1225  NA 139  K 4.1  CL 95*   CO2 33*  GLUCOSE 84  BUN 45*  CREATININE 6.44*  CALCIUM 10.3  MG 2.2  PHOS 4.2   GFR: Estimated Creatinine Clearance: 9.8 mL/min (A) (by C-G formula based on SCr of 6.44 mg/dL (H)).  Liver Function Tests: Recent Labs  Lab 05/18/21 1225  AST 12*  ALT 12  ALKPHOS 54  BILITOT 0.9  PROT 7.9  ALBUMIN 3.5   Dr. Tobie Poet [facility]  If 7PM-7AM, please contact overnight-coverage provider If 7AM-7PM, please contact day coverage provider [email or web address]  05/18/2021, 2:25 PM

## 2021-05-18 NOTE — ED Provider Notes (Signed)
 [facility]  ____________________________________________   Event Date/Time   First MD Initiated Contact with Patient 05/18/21 1141     (approximate)  I have reviewed the triage vital signs and the nursing notes.   HISTORY  Chief Complaint Shortness of Breath    HPI Cassandra Joseph is a 75 y.o. female with pmh ESRD on dialysis TTS, anemia of chronic disease, paroxysmal A. 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  Symptoms started 2 nights ago.  She endorses increased dyspnea on exertion.  Denies cough congestion or fevers.  She denies associated chest pain.  Does feel like she has increased swelling in her bilateral lower extremities.  Denies hemoptysis or history of pulmonary embolism.  Last had dialysis on Saturday and she says she had a full session with normal amount of fluid removed.  She was at dialysis today and they were concerned about her respiratory status so she was transported to the ED and did not have a dialysis session.         Past Medical History:  Diagnosis Date   [place] tumor    a. s/p TAH/BSO 08/2008   CHF (congestive heart failure) (HCC)    ESRD (end stage renal disease) (HCC)    a. on HD x 11 years; b. felt to be 2/2 HTN; c. HD TTS   History of nuclear stress test    a. 08/2015: probably normal myocardial perfusion study, no evidence for significant ischemia or scar was noted, during stress, global systolic function normal, EF 54%, RV borderline dilatation, coronary artery calcifications and marked mitral annular calcifications were noted, liver calcifications were noted on attenuation CT scan likely represent sequelae of previous granulomatous disease   HLD (hyperlipidemia)    Hypertension    Ischemic stroke (HCC)    a. 03/2014   Metabolic syndrome     Morbid obesity (HCC)    Multiple myeloma (HCC)    a. in remission   Osteomyelitis (HCC)    a. 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 2014    Patient Active Problem List   Diagnosis Date Noted   ESRD on hemodialysis (HCC) 04/28/2021   PAF (paroxysmal atrial fibrillation) (HCC) 04/28/2021   Chronic anticoagulation 04/28/2021   Chronic respiratory failure with hypoxia (HCC) 04/28/2021   Rheumatoid arthritis (HCC) 04/28/2021   OSA on CPAP 04/28/2021   Obesity, Class III, BMI 40-49.9 (morbid obesity) (HCC) 04/28/2021   Multiple myeloma in relapse (HCC) 04/28/2021   Multifocal pneumonia 04/28/2021   CHF (congestive heart failure) (HCC) 04/28/2021   Effusion of left hip 09/28/2020   Septic arthritis (HCC) 09/27/2020   Rheumatoid arthritis with positive rheumatoid factor (HCC) 09/27/2020   Left leg pain    Viral pneumonia 08/08/2020   Pneumonia due to COVID-19 virus 08/07/2020   History of anemia due to chronic kidney disease    ESRD (end stage renal disease) (HCC)    SOB (shortness of breath)    Acute pulmonary edema (HCC) 06/23/2019   Acute on chronic diastolic CHF (congestive heart failure) (HCC) 01/18/2017   HLD (hyperlipidemia) 01/18/2017   Atypical chest pain 11/09/2016   ESRD on hemodialysis (HCC) 11/09/2016   Morbid obesity (HCC) 11/09/2016   Essential hypertension 11/09/2016     Multiple myeloma (HCC) 11/09/2016   Elevated troponin 11/09/2016   Metabolic syndrome 11/09/2016    Past Surgical History:  Procedure Laterality Date   ABDOMINAL HYSTERECTOMY     APPENDECTOMY     eyelid surgery     HERNIA REPAIR     right knee surgery      Prior to Admission medications   Medication Sig Start Date End Date Taking? Authorizing Provider  amLODipine (NORVASC) 10 MG tablet Take 1 tablet by mouth daily. 11/27/20 11/27/21  [provider]  amLODipine (NORVASC) 10 MG tablet Take 10 mg  by mouth daily. 02/03/21   [provider]  apixaban (ELIQUIS) 2.5 MG TABS tablet Take 1 tablet (2.5 mg total) by mouth 2 (two) times daily. 06/28/19   Patel, Pranav M, MD  atorvastatin (LIPITOR) 80 MG tablet Take 1 tablet (80 mg total) by mouth daily. 06/29/19   Patel, Pranav M, MD  atorvastatin (LIPITOR) 80 MG tablet Take 80 mg by mouth daily. 03/28/21   [provider]  b complex-vitamin c-folic acid (NEPHRO-VITE) 0.8 MG TABS tablet Take 1 tablet by mouth daily. 09/27/16   [provider]  b complex-vitamin c-folic acid (NEPHRO-VITE) 0.8 MG TABS tablet Take 1 tablet by mouth daily. 02/28/21   [provider]  bisacodyl (DULCOLAX) 10 MG suppository Place 1 suppository (10 mg total) rectally daily as needed for moderate constipation. 10/05/20   McClung, Jeffrey T, MD  carvedilol (COREG) 25 MG tablet Take 25 mg by mouth 2 (two) times daily. 02/03/21   [provider]  carvedilol (COREG) 25 MG tablet Take 25 mg by mouth 2 (two) times daily. 02/03/21   [provider]  cefdinir (OMNICEF) 300 MG capsule Take 1 capsule (300 mg total) by mouth 2 (two) times daily. 05/01/21   Griffith, Kelly A, DO  cinacalcet (SENSIPAR) 60 MG tablet Take 60 mg by mouth 2 (two) times daily.    [provider]  docusate sodium (COLACE) 100 MG capsule Take 100 mg by mouth 2 (two) times daily. 03/08/21   [provider]  ELIQUIS 2.5 MG TABS tablet Take 2.5 mg by mouth 2 (two) times daily. 04/04/21   [provider]  epoetin alfa (EPOGEN) 4000 UNIT/ML injection Inject 1 mL (4,000 Units total) into the vein Every Tuesday,Thursday,and Saturday with dialysis. 10/05/20   McClung, Jeffrey T, MD  fluticasone (FLONASE) 50 MCG/ACT nasal spray Place 1-2 sprays into both nostrils daily at 12 noon. 12/26/20   [provider]  gabapentin (NEURONTIN) 300 MG capsule Take 300 mg by mouth 3 (three) times daily. 07/27/20   [provider]  gabapentin  (NEURONTIN) 300 MG capsule Take 300 mg by mouth 3 (three) times daily. 03/31/21   [provider]  HYDROcodone-acetaminophen (NORCO/VICODIN) 5-325 MG tablet Take 1 tablet by mouth 2 (two) times daily as needed for pain. 02/08/21   [provider]  hydrocortisone cream 1 % Apply 1 application topically 2 (two) times daily. 11/24/20   Jessup, Charles, MD  Hydrocortisone, Perianal, 1 % CREA Apply topically. 11/24/20   [provider]  hydroxychloroquine (PLAQUENIL) 200 MG tablet Take 200 mg by mouth daily. 06/30/20   [provider]  hydroxychloroquine (PLAQUENIL) 200 MG tablet Take 200 mg by mouth daily. 04/14/21   [provider]  midodrine (PROAMATINE) 10 MG tablet Take 1 tablet (10 mg total) by mouth 3 (three) times daily with meals. 06/28/19   Patel, Pranav M, MD  midodrine (PROAMATINE) 10 MG tablet Take 10 mg   by mouth 3 (three) times daily. 04/22/21   [provider]  Nutritional Supplements (FEEDING SUPPLEMENT, NEPRO CARB STEADY,) LIQD Take 237 mLs by mouth 3 (three) times daily between meals. 05/01/21   Ezekiel Slocumb, DO  polyethylene glycol (MIRALAX / GLYCOLAX) 17 g packet Take 17 g by mouth 2 (two) times daily. 10/05/20   Cherene Altes, MD  senna (SENOKOT) 8.6 MG tablet Take 2 tablets by mouth at bedtime. 11/27/20   [provider]  sevelamer carbonate (RENVELA) 800 MG tablet Take 800 mg by mouth 4 (four) times daily.    [provider]  sevelamer carbonate (RENVELA) 800 MG tablet Take 1,600-2,400 mg by mouth 3 (three) times daily. Take 3 tabs with meals and 2 tabs with snacks 03/16/21   [provider]    Allergies Shellfish allergy and Shellfish allergy  Family History  Problem Relation Age of Onset   Seizures Mother    Hypertension Mother    Throat cancer Father    Breast cancer Neg Hx     Social History Social History   Tobacco Use   Smoking status: Former    Types: Cigarettes   Smokeless tobacco:  Never   Tobacco comments:    Quit in 2007  Substance Use Topics   Alcohol use: No   Drug use: No    Review of Systems   Review of Systems  Constitutional:  Negative for chills and fever.  Respiratory:  Positive for shortness of breath. Negative for chest tightness.   Cardiovascular:  Positive for leg swelling. Negative for chest pain.  Gastrointestinal:  Positive for nausea. Negative for abdominal pain and vomiting.  All other systems reviewed and are negative.  Physical Exam Updated Vital Signs BP 139/76   Pulse 76   Temp 98 F (36.7 C) (Oral)   Resp (!) 23   Wt 113.4 kg   SpO2 91%   BMI 40.35 kg/m   Physical Exam Vitals and nursing note reviewed.  Constitutional:      General: She is not in acute distress.    Appearance: Normal appearance.  HENT:     Head: Normocephalic and atraumatic.  Eyes:     General: No scleral icterus.    Conjunctiva/sclera: Conjunctivae normal.  Cardiovascular:     Rate and Rhythm: Normal rate and regular rhythm.  Pulmonary:     Effort: Respiratory distress present.     Breath sounds: No stridor.     Comments: Patient is mildly dyspneic at rest, lung sounds are diminished throughout with crackles at the bases Abdominal:     Palpations: Abdomen is soft. There is no mass.     Tenderness: There is no guarding or rebound.  Musculoskeletal:        General: No deformity or signs of injury.     Cervical back: Normal range of motion.     Right lower leg: Edema present.     Left lower leg: Edema present.     Comments: 1+ lower extremity edema bilaterally, symmetric  Skin:    General: Skin is dry.     Coloration: Skin is not jaundiced or pale.  Neurological:     General: No focal deficit present.     Mental Status: She is alert and oriented to person, place, and time. Mental status is at baseline.  Psychiatric:        Mood and Affect: Mood normal.        Behavior: Behavior normal.     LABS (all  labs ordered are listed, but only  abnormal results are displayed)  Labs Reviewed  COMPREHENSIVE METABOLIC PANEL - Abnormal; Notable for the following components:      Result Value   Chloride 95 (*)    CO2 33 (*)    BUN 45 (*)    Creatinine, Ser 6.44 (*)    AST 12 (*)    GFR, Estimated 6 (*)    All other components within normal limits  BRAIN NATRIURETIC PEPTIDE - Abnormal; Notable for the following components:   B Natriuretic Peptide 1,458.4 (*)    All other components within normal limits  CBC WITH DIFFERENTIAL/PLATELET - Abnormal; Notable for the following components:   RBC 2.92 (*)    Hemoglobin 9.6 (*)    HCT 29.2 (*)    RDW 16.6 (*)    All other components within normal limits  TROPONIN I (HIGH SENSITIVITY) - Abnormal; Notable for the following components:   Troponin I (High Sensitivity) 68 (*)    All other components within normal limits  MAGNESIUM  PHOSPHORUS  TROPONIN I (HIGH SENSITIVITY)   ____________________________________________  EKG  Relation with normal rate, normal axis, normal intervals, no acute ischemic changes ____________________________________________  RADIOLOGY I, Madelin Headings, personally viewed and evaluated these images (plain radiographs) as part of my medical decision making, as well as reviewing the written report by the radiologist.  ED MD interpretation: I reviewed the chest x-ray which shows infiltrates bilaterally, unclear if pneumonia versus pulmonary edema    ____________________________________________   PROCEDURES  Procedure(s) performed (including Critical Care):  Procedures   ____________________________________________   INITIAL IMPRESSION / ASSESSMENT AND PLAN / ED COURSE     Patient is a 75 year old female end-stage renal disease who presents from dialysis with dyspnea.  Patient was sent from dialysis due to increasing oxygen requirement and dyspnea.  Tells me this has been going on for several days.  She has no cough or other infectious  symptoms.  Patient appears somewhat volume overloaded with lower extremity edema.  She is saturating 93% on 4 L nasal cannula, her baseline is 3 L.  Mildly tachypneic, blood pressure within normal limits.  Chest x-ray shows patchy infiltrates bilaterally, which could be pulmonary edema versus multifocal pneumonia or atelectasis.  BNP significantly elevated, troponin also elevated but patient has no chest pain and her EKG is nonischemic suspect that this is related to her end-stage renal disease and demand.  Patient unfortunately does not make any urine.  Ultimately will need dialysis.  Does not have much room to go on her blood pressure.  Will admit for further management.      ____________________________________________   FINAL CLINICAL IMPRESSION(S) / ED DIAGNOSES  Final diagnoses:  Acute on chronic respiratory failure with hypoxia [facility])     ED Discharge Orders     None        Note:  This document was prepared using Dragon voice recognition software and may include unintentional dictation errors.    Rada Hay, MD 05/18/21 1336

## 2021-05-18 NOTE — Progress Notes (Signed)
[place], [place] 05/18/21  Subjective:   Hospital day # 0   Patient known to our practice from previous admissions.  This time presents to the emergency room for dyspnea on exertion.  She finished her dialysis on Saturday but has been short of breath over the weekend.  This morning she was sent from her dialysis center to the emergency room over concerns about her breathing status.  Evaluation in the ER shows increasing oxygen requirements from her baseline of 3 L up to 5 L today.  Chest x-ray shows bibasilar opacities increased on the right.  BNP is elevated at 1458.  Emergency room has consulted nephrology for evaluation of urgent hemodialysis.  So she denies any nausea or vomiting no fevers no chills she does have swelling in her legs and she is having increasing oxygen requirements  Objective:  Vital signs in last 24 hours:  Temp:  [98 F (36.7 C)] 98 F (36.7 C) (10/11 1144) Pulse Rate:  [68-81] 76 (10/11 1330) Resp:  [20-29] 23 (10/11 1330) BP: (126-150)/(50-76) 139/76 (10/11 1330) SpO2:  [89 %-93 %] 91 % (10/11 1330) Weight:  [113.4 kg] 113.4 kg (10/11 1159)  Weight change:  Filed Weights   05/18/21 1159  Weight: 113.4 kg    Intake/Output:   No intake or output data in the 24 hours ending 05/18/21 1355   Physical Exam: General: No acute distress lying in the bed  HEENT Moist oral mucous membranes  Pulm/lungs Bilateral pulmonary crackles  CVS/Heart Irregular rhythm, A. fib  Abdomen:  Soft, nontender  Extremities: 2+ edema over the legs bilaterally  Neurologic: Alert, oriented  Skin: No acute rashes  Access: Right forearm AV fistula       Basic Metabolic Panel:  Recent Labs  Lab 05/18/21 1225  NA 139  K 4.1  CL 95*  CO2 33*  GLUCOSE 84  BUN 45*  CREATININE 6.44*  CALCIUM 10.3  MG 2.2  PHOS 4.2     CBC: Recent Labs  Lab 05/18/21 1225  WBC 6.3  NEUTROABS 4.1  HGB 9.6*  HCT 29.2*  MCV 100.0  PLT 174       Lab Results  Component Value Date   HEPBSAG NON REACTIVE 04/29/2021   HEPBSAB Reactive (A) 04/29/2021      Microbiology:  No results found for this or any previous visit (from the past 240 hour(s)).  Coagulation Studies: No results for input(s): LABPROT, INR in the last 72 hours.  Urinalysis: No results for input(s): COLORURINE, LABSPEC, PHURINE, GLUCOSEU, HGBUR, BILIRUBINUR, KETONESUR, PROTEINUR, UROBILINOGEN, NITRITE, LEUKOCYTESUR in the last 72 hours.  Invalid input(s): APPERANCEUR    Imaging: DG Chest Portable 1 View  Result Date: 05/18/2021 CLINICAL DATA:  Shortness of breath. Additional history provided: Patient reports shortness of breath for the last few days, patient transferred via EMS from dialysis, patient did not complete dialysis today,. EXAM: PORTABLE CHEST 1 VIEW COMPARISON:  Prior chest radiographs 04/24/2021 and earlier. PET-CT 12/09/2020. FINDINGS: Cardiomegaly, unchanged. Aortic atherosclerosis. Interstitial edema, new as compared to the chest radiographs of 04/24/2021. Chronic elevation of the right hemidiaphragm. Bibasilar opacities, increased on the right. These opacities may reflect atelectasis, scarring and/or pneumonia. Suspected small right pleural effusion. No evidence of pneumothorax. No acute bony abnormality identified. IMPRESSION: Redemonstrated cardiomegaly. Interstitial edema, new from the prior chest radiographs of 04/24/2021. Bibasilar opacities, increased on the right. These opacities may reflect atelectasis, scarring and/or pneumonia. Suspected small right pleural effusion. Chronic elevation of the right hemidiaphragm. Aortic  Atherosclerosis (ICD10-I70.0). Electronically Signed   By: Kellie Simmering D.O.   On: 05/18/2021 12:38     Medications:    No current facility-administered medications for this encounter.  Current Outpatient Medications:    amLODipine (NORVASC) 10 MG tablet, Take 1 tablet by mouth daily., Disp: , Rfl:    amLODipine  (NORVASC) 10 MG tablet, Take 10 mg by mouth daily., Disp: , Rfl:    apixaban (ELIQUIS) 2.5 MG TABS tablet, Take 1 tablet (2.5 mg total) by mouth 2 (two) times daily., Disp: 60 tablet, Rfl: 0   atorvastatin (LIPITOR) 80 MG tablet, Take 1 tablet (80 mg total) by mouth daily., Disp: 30 tablet, Rfl: 0   atorvastatin (LIPITOR) 80 MG tablet, Take 80 mg by mouth daily., Disp: , Rfl:    b complex-vitamin c-folic acid (NEPHRO-VITE) 0.8 MG TABS tablet, Take 1 tablet by mouth daily., Disp: , Rfl: 11   b complex-vitamin c-folic acid (NEPHRO-VITE) 0.8 MG TABS tablet, Take 1 tablet by mouth daily., Disp: , Rfl:    bisacodyl (DULCOLAX) 10 MG suppository, Place 1 suppository (10 mg total) rectally daily as needed for moderate constipation., Disp: 12 suppository, Rfl: 0   carvedilol (COREG) 25 MG tablet, Take 25 mg by mouth 2 (two) times daily., Disp: , Rfl:    carvedilol (COREG) 25 MG tablet, Take 25 mg by mouth 2 (two) times daily., Disp: , Rfl:    cefdinir (OMNICEF) 300 MG capsule, Take 1 capsule (300 mg total) by mouth 2 (two) times daily., Disp: 6 capsule, Rfl: 0   cinacalcet (SENSIPAR) 60 MG tablet, Take 60 mg by mouth 2 (two) times daily., Disp: , Rfl:    docusate sodium (COLACE) 100 MG capsule, Take 100 mg by mouth 2 (two) times daily., Disp: , Rfl:    ELIQUIS 2.5 MG TABS tablet, Take 2.5 mg by mouth 2 (two) times daily., Disp: , Rfl:    epoetin alfa (EPOGEN) 4000 UNIT/ML injection, Inject 1 mL (4,000 Units total) into the vein Every Tuesday,Thursday,and Saturday with dialysis., Disp: 1 mL, Rfl:    fluticasone (FLONASE) 50 MCG/ACT nasal spray, Place 1-2 sprays into both nostrils daily at 12 noon., Disp: , Rfl:    gabapentin (NEURONTIN) 300 MG capsule, Take 300 mg by mouth 3 (three) times daily., Disp: , Rfl:    gabapentin (NEURONTIN) 300 MG capsule, Take 300 mg by mouth 3 (three) times daily., Disp: , Rfl:    HYDROcodone-acetaminophen (NORCO/VICODIN) 5-325 MG tablet, Take 1 tablet by mouth 2 (two) times  daily as needed for pain., Disp: , Rfl:    hydrocortisone cream 1 %, Apply 1 application topically 2 (two) times daily., Disp: 30 g, Rfl: 0   Hydrocortisone, Perianal, 1 % CREA, Apply topically., Disp: , Rfl:    hydroxychloroquine (PLAQUENIL) 200 MG tablet, Take 200 mg by mouth daily., Disp: , Rfl:    hydroxychloroquine (PLAQUENIL) 200 MG tablet, Take 200 mg by mouth daily., Disp: , Rfl:    midodrine (PROAMATINE) 10 MG tablet, Take 1 tablet (10 mg total) by mouth 3 (three) times daily with meals., Disp: 90 tablet, Rfl: 0   midodrine (PROAMATINE) 10 MG tablet, Take 10 mg by mouth 3 (three) times daily., Disp: , Rfl:    Nutritional Supplements (FEEDING SUPPLEMENT, NEPRO CARB STEADY,) LIQD, Take 237 mLs by mouth 3 (three) times daily between meals., Disp: , Rfl: 0   polyethylene glycol (MIRALAX / GLYCOLAX) 17 g packet, Take 17 g by mouth 2 (two) times daily., Disp: 14 each, Rfl:  0   senna (SENOKOT) 8.6 MG tablet, Take 2 tablets by mouth at bedtime., Disp: , Rfl:    sevelamer carbonate (RENVELA) 800 MG tablet, Take 800 mg by mouth 4 (four) times daily., Disp: , Rfl:    sevelamer carbonate (RENVELA) 800 MG tablet, Take 1,600-2,400 mg by mouth 3 (three) times daily. Take 3 tabs with meals and 2 tabs with snacks, Disp: , Rfl:    Assessment/ Plan:  75 y.o. female with   hypertension, anemia, multiple myeloma, chronic respiratory failure on home O2, and end stage renal disease on dialysis  admitted on 05/18/2021 for sob  [facility]/TTS/Lt AVF  #Shortness of breath with volume overload/acute pulmonary edema in the setting of end-stage renal disease.  Patient has increasing oxygen requirement.  Chest x-ray suggest volume overload. -Plan for urgent hemodialysis today as patient missed her treatment -Volume removal as tolerated  #Anemia of chronic kidney disease  Lab Results  Component Value Date   HGB 9.6 (L) 05/18/2021     #Secondary hyperparathyroidism Lab Results  Component Value  Date   PTH 242 (H) 09/29/2020   CALCIUM 10.3 05/18/2021   PHOS 4.2 05/18/2021  Monitor calcium and phosphorus Depending on labs, continue Cinacalcet as well as Renvela at home doses.     LOS: 0 Jordie Skalsky 10/11/20221:55 PM  [place], [place]  Note: This note was prepared with Dragon dictation. Any transcription errors are unintentional

## 2021-05-18 NOTE — Progress Notes (Signed)
Pt with RA AVF, cannulated w/ease decreased BFR d/t positioning. Targeted UF met 3 liters removed. O2 sats >90% throughout tx.  6 6 LPM via [place]. Pt. reports feeling less SOB. Report given to charge in ED.

## 2021-05-19 ENCOUNTER — Encounter: Payer: Self-pay | Admitting: Student

## 2021-05-19 DIAGNOSIS — D631 Anemia in chronic kidney disease: Secondary | ICD-10-CM | POA: Diagnosis present

## 2021-05-19 DIAGNOSIS — M059 Rheumatoid arthritis with rheumatoid factor, unspecified: Secondary | ICD-10-CM | POA: Diagnosis present

## 2021-05-19 DIAGNOSIS — E8779 Other fluid overload: Secondary | ICD-10-CM | POA: Diagnosis present

## 2021-05-19 DIAGNOSIS — M109 Gout, unspecified: Secondary | ICD-10-CM | POA: Diagnosis present

## 2021-05-19 DIAGNOSIS — J9611 Chronic respiratory failure with hypoxia: Secondary | ICD-10-CM | POA: Diagnosis not present

## 2021-05-19 DIAGNOSIS — I700 Atherosclerosis of aorta: Secondary | ICD-10-CM | POA: Diagnosis present

## 2021-05-19 DIAGNOSIS — J9621 Acute and chronic respiratory failure with hypoxia: Secondary | ICD-10-CM | POA: Diagnosis present

## 2021-05-19 DIAGNOSIS — N186 End stage renal disease: Secondary | ICD-10-CM | POA: Diagnosis present

## 2021-05-19 DIAGNOSIS — E8881 Metabolic syndrome: Secondary | ICD-10-CM | POA: Diagnosis present

## 2021-05-19 DIAGNOSIS — I2510 Atherosclerotic heart disease of native coronary artery without angina pectoris: Secondary | ICD-10-CM | POA: Diagnosis present

## 2021-05-19 DIAGNOSIS — R0602 Shortness of breath: Secondary | ICD-10-CM | POA: Diagnosis not present

## 2021-05-19 DIAGNOSIS — C9000 Multiple myeloma not having achieved remission: Secondary | ICD-10-CM | POA: Diagnosis present

## 2021-05-19 DIAGNOSIS — Z20822 Contact with and (suspected) exposure to covid-19: Secondary | ICD-10-CM | POA: Diagnosis present

## 2021-05-19 DIAGNOSIS — G4733 Obstructive sleep apnea (adult) (pediatric): Secondary | ICD-10-CM | POA: Diagnosis present

## 2021-05-19 DIAGNOSIS — I5032 Chronic diastolic (congestive) heart failure: Secondary | ICD-10-CM | POA: Diagnosis present

## 2021-05-19 DIAGNOSIS — Z8616 Personal history of COVID-19: Secondary | ICD-10-CM | POA: Diagnosis not present

## 2021-05-19 DIAGNOSIS — E8770 Fluid overload, unspecified: Secondary | ICD-10-CM | POA: Diagnosis present

## 2021-05-19 DIAGNOSIS — R778 Other specified abnormalities of plasma proteins: Secondary | ICD-10-CM | POA: Diagnosis present

## 2021-05-19 DIAGNOSIS — N2581 Secondary hyperparathyroidism of renal origin: Secondary | ICD-10-CM | POA: Diagnosis present

## 2021-05-19 DIAGNOSIS — K5900 Constipation, unspecified: Secondary | ICD-10-CM | POA: Diagnosis present

## 2021-05-19 DIAGNOSIS — I132 Hypertensive heart and chronic kidney disease with heart failure and with stage 5 chronic kidney disease, or end stage renal disease: Secondary | ICD-10-CM | POA: Diagnosis present

## 2021-05-19 DIAGNOSIS — E785 Hyperlipidemia, unspecified: Secondary | ICD-10-CM | POA: Diagnosis present

## 2021-05-19 DIAGNOSIS — R34 Anuria and oliguria: Secondary | ICD-10-CM | POA: Diagnosis present

## 2021-05-19 DIAGNOSIS — Z6841 Body Mass Index (BMI) 40.0 and over, adult: Secondary | ICD-10-CM | POA: Diagnosis not present

## 2021-05-19 DIAGNOSIS — I480 Paroxysmal atrial fibrillation: Secondary | ICD-10-CM | POA: Diagnosis present

## 2021-05-19 DIAGNOSIS — Z992 Dependence on renal dialysis: Secondary | ICD-10-CM | POA: Diagnosis not present

## 2021-05-19 LAB — CBC
HCT: 26.7 % — ABNORMAL LOW (ref 36.0–46.0)
Hemoglobin: 8.4 g/dL — ABNORMAL LOW (ref 12.0–15.0)
MCH: 32.1 pg (ref 26.0–34.0)
MCHC: 31.5 g/dL (ref 30.0–36.0)
MCV: 101.9 fL — ABNORMAL HIGH (ref 80.0–100.0)
Platelets: 144 10*3/uL — ABNORMAL LOW (ref 150–400)
RBC: 2.62 MIL/uL — ABNORMAL LOW (ref 3.87–5.11)
RDW: 16.7 % — ABNORMAL HIGH (ref 11.5–15.5)
WBC: 5.2 10*3/uL (ref 4.0–10.5)
nRBC: 0 % (ref 0.0–0.2)

## 2021-05-19 LAB — BASIC METABOLIC PANEL
Anion gap: 10 (ref 5–15)
BUN: 28 mg/dL — ABNORMAL HIGH (ref 8–23)
CO2: 30 mmol/L (ref 22–32)
Calcium: 9.8 mg/dL (ref 8.9–10.3)
Chloride: 98 mmol/L (ref 98–111)
Creatinine, Ser: 4.21 mg/dL — ABNORMAL HIGH (ref 0.44–1.00)
GFR, Estimated: 11 mL/min — ABNORMAL LOW (ref >60)
Glucose, Bld: 77 mg/dL (ref 70–99)
Potassium: 3.9 mmol/L (ref 3.5–5.1)
Sodium: 138 mmol/L (ref 135–145)

## 2021-05-19 LAB — TSH: TSH: 0.71 u[IU]/mL (ref 0.350–4.500)

## 2021-05-19 LAB — TROPONIN I (HIGH SENSITIVITY): Troponin I (High Sensitivity): 60 ng/L — ABNORMAL HIGH (ref <18)

## 2021-05-19 MED ORDER — CARVEDILOL 6.25 MG PO TABS
3.1250 mg | ORAL_TABLET | Freq: Two times a day (BID) | ORAL | Status: DC
  Administered 2021-05-19: 3.125 mg via ORAL
  Filled 2021-05-19: qty 1

## 2021-05-19 MED ORDER — MIDODRINE HCL 5 MG PO TABS
10.0000 mg | ORAL_TABLET | Freq: Four times a day (QID) | ORAL | Status: DC | PRN
  Administered 2021-05-19: 10 mg via ORAL
  Filled 2021-05-19 (×2): qty 2

## 2021-05-19 NOTE — ED Notes (Signed)
Awaiting transport for dialysis.  Pt assigned room 208 after dialysis completed, room being cleaned at this time.

## 2021-05-19 NOTE — ED Notes (Signed)
Pt escorted to dialysis for tx.  Room tentatively assigned to 208 when treatment complete

## 2021-05-19 NOTE — ED Notes (Signed)
Dialysis treatment complete, bed ready 208, pt to transfer to unit, belongings to be sent up

## 2021-05-19 NOTE — ED Notes (Signed)
Pt remains in dialysis at this time, bed ready on inpatient unit once complete

## 2021-05-19 NOTE — ED Notes (Signed)
Informed RN bed assigned 

## 2021-05-19 NOTE — Progress Notes (Signed)
Patient is hypotensive and bradycardic. Patient is asleep on bipap. Notified NP Randol Kern on current vital signs. No new orders, will continue to monitor. Will notify practitioner if MAP drops below 50.

## 2021-05-19 NOTE — ED Notes (Signed)
Dr Candiss Norse rounded with renal team, recommends holding norvasc due to lower b/p with pt going for additional dialysis treatment today.  Also recommend only giving carvedilol 6.25mg  instead of complete dose.   Meds given as ordered

## 2021-05-19 NOTE — ED Notes (Signed)
Report provided to dialysis  nurse for treatment today

## 2021-05-19 NOTE — Progress Notes (Signed)
[facility] Progress Note  Patient: Cassandra Joseph    ZSW:[id number]  DOA: 05/18/2021     Date of Service: the patient was seen and examined on 05/19/2021  Chief Complaint  Patient presents with   Shortness of Breath   Brief hospital course: Cassandra Joseph is a 75 y.o. female with medical history significant for hypertension, hyperlipidemia, morbid obesity, OSA on CPAP, end-stage renal disease on hemodialysis Tuesday Thursday Saturday, paroxysmal atrial fibrillation on Eliquis twice daily, who presents to the emergency department for chief concerns of shortness of breath.   Patient was at hemodialysis center.  She reported to dialysis nurse that she was short of breath.  Hemodialysis center sent patient to the emergency department for further evaluation and a dialysis session was not completed at the hemodialysis center.   Patient reports compliance with hemodialysis and her last session was on Saturday, 05/15/2021.  She reports that the full amount was removed and she received a full dialysis treatment at that time.   The shortness of breat started Saturday evening. She denies cough. The shortness is worse with exertion and laying flat. She denies fever. She endorses some pleghm that is clear. She denies chest pain, abdominal pain. She endorses consitpation and denies diarrhea and nausea. She denies blood in her stool and vomtiing. Her last bowel movement was Sunday.    She does not make urine.   ED Course: Discussed with emergency medicine provider, patient requiring hospitalization for chief concerns of shortness of breath presumed secondary to volume overload.  Vitals in the emergency department was remarkable for temperature 98, respiration rate of 25, heart rate is 75, initial blood pressure 134/62, SPO2 of 92% on 4 L nasal cannula.  Labs in the emergency department was remarkable for sodium 139, potassium 4.1, chloride 95, bicarb 33, BUN of 45, serum creatinine of 6.44, EGFR  6.  BNP was elevated at 1458.  High sensitive troponin is 68.  WBC 6.3, hemoglobin 9.6, platelets 174.  Assessment and Plan: Principal Problem:   Volume overload Active Problems:   ESRD on hemodialysis (HCC)   Essential hypertension   Multiple myeloma (HCC)   Elevated troponin   HLD (hyperlipidemia)   SOB (shortness of breath)   History of anemia due to chronic kidney disease   PAF (paroxysmal atrial fibrillation) (HCC)   Chronic respiratory failure with hypoxia (HCC)   OSA on CPAP   Obesity, Class III, BMI 40-49.9 (morbid obesity) ([facility])   # Shortness of breath-most likely volume overload secondary to ESRD Consulted and patient is getting hemodialysis - procalcitonin negative  - Negative COVID/influenza A/influenza B PCR - Continuous BiPAP prn  - Nephrology consulted, patient got hemodialysis on 10/11 and patient will receive an additional hemodialysis today.  Patient's hemodialysis schedule is TTS    # End-stage renal disease on hemodialysis-treat as above - Patient is auric - Resumed Cinacalcet 60 mg p.o. twice daily, sevelamer home dose - Nephrology has been consulted for hemodialysis   # Hypertension-Low blood pressure Discontinued amlodipine, decreased Coreg from 25 twice daily to 3.125 twice daily with holding parameters Started midodrine  # Elevated troponin-presumed secondary to chronically hemodialysis dependent - I have no clinical suspicion for ACS at this time as EKG did not show evidence of ischemia and patient denies chest pain # OSA-BiPAP nightly ordered # Paroxysmal atrial fibrillation-apixaban 2.5 mg p.o. twice daily # Hyperlipidemia-atorvastatin 80 mg nightly # History of multiple myeloma-recommend follow-up with outpatient medical oncologist, Dr. Rogue Bussing # History of rheumatoid arthritis,  Seropositive-patient takes hydroxychloroquine 200 mg p.o. daily  Body mass index is 40.35 kg/m.  Interventions:       Diet: renal diet DVT Prophylaxis:  Therapeutic Anticoagulation with Eliquis    Advance goals of care discussion: Full code  Family Communication: family was NOT present at bedside, at the time of interview.  The pt provided permission to discuss medical plan with the family. Opportunity was given to ask question and all questions were answered satisfactorily.   Disposition:  Pt is from Home, admitted with resp failure due to volume overload, still has respiratory failure, needs hemodialysis, which precludes a safe discharge. Discharge to home, when respiratory failure will improve and cleared by nephrology..  Subjective: No significant overnight events, patient still has shortness of breath requiring 7 L oxygen via nasal cannula, patient feels improvement after hemodialysis.  Patient agreed for another treatment of hemodialysis today, denies any chest pain or palpitations, no any other active issues.  Physical Exam: General:  alert oriented to time, place, and person.  Appear in moderate distress, affect appropriate Eyes: PERRLA ENT: Oral Mucosa Clear, moist  Neck: no JVD,  Cardiovascular: S1 and S2 Present, no Murmur,  Respiratory: good respiratory effort, Bilateral Air entry equal and Decreased, b/l Crackles, no wheezes Abdomen: Bowel Sound present, Soft and no tenderness,  Skin: no raeshe Extremities: no Pedal edema, no calf tenderness Neurologic: without any new focal findings Gait not checked due to patient safety concerns  Vitals:   05/19/21 1000 05/19/21 1200 05/19/21 1310 05/19/21 1554  BP: (!) 105/49 (!) 92/51    Pulse: 64 (!) 54 68   Resp: 18 (!) 25 18   Temp:    98.4 F (36.9 C)  TempSrc:    Oral  SpO2: 100% 94% 94% 91%  Weight:        Intake/Output Summary (Last 24 hours) at 05/19/2021 1650 Last data filed at 05/18/2021 2200 Gross per 24 hour  Intake --  Output 3001 ml  Net -3001 ml   Filed Weights   05/18/21 1159  Weight: 113.4 kg    Data Reviewed: I have personally reviewed and  interpreted daily labs, tele strips, imagings as discussed above. I reviewed all nursing notes, pharmacy notes, vitals, pertinent old records I have discussed plan of care as described above with RN and patient/family.  CBC: Recent Labs  Lab 05/18/21 1225 05/19/21 0725  WBC 6.3 5.2  NEUTROABS 4.1  --   HGB 9.6* 8.4*  HCT 29.2* 26.7*  MCV 100.0 101.9*  PLT 174 161*   Basic Metabolic Panel: Recent Labs  Lab 05/18/21 1225 05/19/21 0725  NA 139 138  K 4.1 3.9  CL 95* 98  CO2 33* 30  GLUCOSE 84 77  BUN 45* 28*  CREATININE 6.44* 4.21*  CALCIUM 10.3 9.8  MG 2.2  --   PHOS 4.2  --     Studies: No results found.  Scheduled Meds:  amLODipine  10 mg Oral Daily   apixaban  2.5 mg Oral BID   aspirin EC  81 mg Oral Daily   atorvastatin  80 mg Oral Daily   carvedilol  25 mg Oral BID   Chlorhexidine Gluconate Cloth  6 each Topical Q0600   cinacalcet  60 mg Oral BID WC   docusate sodium  100 mg Oral BID   [START ON 05/20/2021] epoetin (EPOGEN/PROCRIT) injection  4,000 Units Intravenous Q T,Th,Sa-HD   fluticasone  1-2 spray Each Nare Q1200   hydroxychloroquine  200 mg Oral Daily  polyethylene glycol  17 g Oral BID   senna  2 tablet Oral QHS   sevelamer carbonate  1,600-2,400 mg Oral TID with meals   Continuous Infusions: PRN Meds: acetaminophen **OR** acetaminophen, bisacodyl, HYDROcodone-acetaminophen, midodrine, ondansetron **OR** ondansetron (ZOFRAN) IV  Time spent: 35 minutes  Author: Val Riles. MD [facility] 05/19/2021 4:50 PM  To reach On-call, see care teams to locate the attending and reach out to them via [email or web address]. If 7PM-7AM, please contact night-coverage If you still have difficulty reaching the attending provider, please page the [facility] (Director on Call) for [facility] on amion for assistance.

## 2021-05-19 NOTE — Progress Notes (Signed)
[place], [place] 05/19/21  Subjective:   Hospital day # 0   Patient known to our practice from previous admissions.  This time presents to the emergency room for dyspnea on exertion.  She finished her dialysis on Saturday but has been short of breath over the weekend.  This morning she was sent from her dialysis center to the emergency room over concerns about her breathing status.  Evaluation in the ER shows increasing oxygen requirements from her baseline of 3 L up to 5 L today.  Chest x-ray shows bibasilar opacities increased on the right.  BNP is elevated at 1458.  Emergency room has consulted nephrology for evaluation of urgent hemodialysis.  Patient laying in bed with CPAP in place States hr breathing is improved after dialysis yesterday and has returned to baseline. Also feels leg swelling has improved Breakfast tray at bedside   Objective:  Vital signs in last 24 hours:  Temp:  [98 F (36.7 C)-98.3 F (36.8 C)] 98.2 F (36.8 C) (10/12 0800) Pulse Rate:  [40-109] 54 (10/12 1200) Resp:  [18-29] 25 (10/12 1200) BP: (79-155)/(34-134) 92/51 (10/12 1200) SpO2:  [89 %-100 %] 94 % (10/12 1200)  Weight change:  Filed Weights   05/18/21 1159  Weight: 113.4 kg    Intake/Output:    Intake/Output Summary (Last 24 hours) at 05/19/2021 1252 Last data filed at 05/18/2021 2200 Gross per 24 hour  Intake --  Output 3001 ml  Net -3001 ml     Physical Exam: General: No acute distress lying in the bed  HEENT Moist oral mucous membranes  Pulm/lungs Bilateral pulmonary crackles  CVS/Heart Irregular rhythm, A. fib  Abdomen:  Soft, nontender  Extremities: 1+ edema over the legs bilaterally  Neurologic: Alert, oriented  Skin: No acute rashes  Access: Right forearm AV fistula       Basic Metabolic Panel:  Recent Labs  Lab 05/18/21 1225 05/19/21 0725  NA 139 138  K 4.1 3.9  CL 95* 98  CO2 33* 30  GLUCOSE 84 77  BUN 45* 28*  CREATININE  6.44* 4.21*  CALCIUM 10.3 9.8  MG 2.2  --   PHOS 4.2  --       CBC: Recent Labs  Lab 05/18/21 1225 05/19/21 0725  WBC 6.3 5.2  NEUTROABS 4.1  --   HGB 9.6* 8.4*  HCT 29.2* 26.7*  MCV 100.0 101.9*  PLT 174 144*       Lab Results  Component Value Date   HEPBSAG NON REACTIVE 04/29/2021   HEPBSAB Reactive (A) 04/29/2021      Microbiology:  Recent Results (from the past 240 hour(s))  Resp Panel by RT-PCR (Flu A&B, Covid) Nasopharyngeal Swab     Status: None   Collection Time: 05/18/21  2:12 PM   Specimen: Nasopharyngeal Swab; Nasopharyngeal(NP) swabs in vial transport medium  Result Value Ref Range Status   SARS Coronavirus 2 by RT PCR NEGATIVE NEGATIVE Final    Comment: (NOTE) SARS-CoV-2 target nucleic acids are NOT DETECTED.  The SARS-CoV-2 RNA is generally detectable in upper respiratory specimens during the acute phase of infection. The lowest concentration of SARS-CoV-2 viral copies this assay can detect is 138 copies/mL. A negative result does not preclude SARS-Cov-2 infection and should not be used as the sole basis for treatment or other patient management decisions. A negative result may occur with  improper specimen collection/handling, submission of specimen other than nasopharyngeal swab, presence of viral mutation(s) within the areas targeted by  this assay, and inadequate number of viral copies(<138 copies/mL). A negative result must be combined with clinical observations, patient history, and epidemiological information. The expected result is Negative.  Fact Sheet for Patients:  EntrepreneurPulse.com.au  Fact Sheet for Healthcare Providers:  IncredibleEmployment.be  This test is no t yet approved or cleared by the [place] FDA and  has been authorized for detection and/or diagnosis of SARS-CoV-2 by FDA under an Emergency Use Authorization (EUA). This EUA will remain  in effect (meaning this test can be  used) for the duration of the COVID-19 declaration under Section 564(b)(1) of the Act, 21 U.S.C.section 360bbb-3(b)(1), unless the authorization is terminated  or revoked sooner.       Influenza A by PCR NEGATIVE NEGATIVE Final   Influenza B by PCR NEGATIVE NEGATIVE Final    Comment: (NOTE) The Xpert Xpress SARS-CoV-2/FLU/RSV plus assay is intended as an aid in the diagnosis of influenza from Nasopharyngeal swab specimens and should not be used as a sole basis for treatment. Nasal washings and aspirates are unacceptable for Xpert Xpress SARS-CoV-2/FLU/RSV testing.  Fact Sheet for Patients: EntrepreneurPulse.com.au  Fact Sheet for Healthcare Providers: IncredibleEmployment.be  This test is not yet approved or cleared by the [place] FDA and has been authorized for detection and/or diagnosis of SARS-CoV-2 by FDA under an Emergency Use Authorization (EUA). This EUA will remain in effect (meaning this test can be used) for the duration of the COVID-19 declaration under Section 564(b)(1) of the Act, 21 U.S.C. section 360bbb-3(b)(1), unless the authorization is terminated or revoked.  Performed at [facility], [address] [place], [place] [address]     Coagulation Studies: No results for input(s): LABPROT, INR in the last 72 hours.  Urinalysis: No results for input(s): COLORURINE, LABSPEC, PHURINE, GLUCOSEU, HGBUR, BILIRUBINUR, KETONESUR, PROTEINUR, UROBILINOGEN, NITRITE, LEUKOCYTESUR in the last 72 hours.  Invalid input(s): APPERANCEUR    Imaging: DG Chest Portable 1 View  Result Date: 05/18/2021 CLINICAL DATA:  Shortness of breath. Additional history provided: Patient reports shortness of breath for the last few days, patient transferred via EMS from dialysis, patient did not complete dialysis today,. EXAM: PORTABLE CHEST 1 VIEW COMPARISON:  Prior chest radiographs 04/24/2021 and earlier. PET-CT 12/09/2020. FINDINGS:  Cardiomegaly, unchanged. Aortic atherosclerosis. Interstitial edema, new as compared to the chest radiographs of 04/24/2021. Chronic elevation of the right hemidiaphragm. Bibasilar opacities, increased on the right. These opacities may reflect atelectasis, scarring and/or pneumonia. Suspected small right pleural effusion. No evidence of pneumothorax. No acute bony abnormality identified. IMPRESSION: Redemonstrated cardiomegaly. Interstitial edema, new from the prior chest radiographs of 04/24/2021. Bibasilar opacities, increased on the right. These opacities may reflect atelectasis, scarring and/or pneumonia. Suspected small right pleural effusion. Chronic elevation of the right hemidiaphragm. Aortic Atherosclerosis (ICD10-I70.0). Electronically Signed   By: Kellie Simmering D.O.   On: 05/18/2021 12:38     Medications:     Current Facility-Administered Medications:    acetaminophen (TYLENOL) tablet 650 mg, 650 mg, Oral, Q6H PRN **OR** acetaminophen (TYLENOL) suppository 650 mg, 650 mg, Rectal, Q6H PRN, Cox, Amy N, DO   amLODipine (NORVASC) tablet 10 mg, 10 mg, Oral, Daily, Cox, Amy N, DO   apixaban (ELIQUIS) tablet 2.5 mg, 2.5 mg, Oral, BID, Cox, Amy N, DO, 2.5 mg at 05/19/21 1128   aspirin EC tablet 81 mg, 81 mg, Oral, Daily, Cox, Amy N, DO, 81 mg at 05/19/21 1105   atorvastatin (LIPITOR) tablet 80 mg, 80 mg, Oral, Daily, Cox, Amy N, DO, 80 mg at  05/19/21 1102   bisacodyl (DULCOLAX) suppository 10 mg, 10 mg, Rectal, Daily PRN, Cox, Amy N, DO   carvedilol (COREG) tablet 25 mg, 25 mg, Oral, BID, Cox, Amy N, DO, 6.25 mg at 05/19/21 1103   Chlorhexidine Gluconate Cloth 2 % PADS 6 each, 6 each, Topical, Q0600, Cox, Amy N, DO, 6 each at 05/19/21 0608   cinacalcet (SENSIPAR) tablet 60 mg, 60 mg, Oral, BID WC, Cox, Amy N, DO, 60 mg at 05/19/21 1102   docusate sodium (COLACE) capsule 100 mg, 100 mg, Oral, BID, Cox, Amy N, DO, 100 mg at 05/19/21 1105   [START ON 05/20/2021] epoetin alfa (EPOGEN) injection 4,000  Units, 4,000 Units, Intravenous, Q T,Th,Sa-HD, Cox, Amy N, DO   fluticasone (FLONASE) 50 MCG/ACT nasal spray 1-2 spray, 1-2 spray, Each Nare, Q1200, Cox, Amy N, DO, 1 spray at 05/19/21 1244   HYDROcodone-acetaminophen (NORCO/VICODIN) 5-325 MG per tablet 1 tablet, 1 tablet, Oral, BID PRN, Cox, Amy N, DO   hydroxychloroquine (PLAQUENIL) tablet 200 mg, 200 mg, Oral, Daily, Cox, Amy N, DO, 200 mg at 05/19/21 1105   midodrine (PROAMATINE) tablet 10 mg, 10 mg, Oral, Q6H PRN, Sharion Settler, NP, 10 mg at 05/19/21 1102   ondansetron (ZOFRAN) tablet 4 mg, 4 mg, Oral, Q6H PRN **OR** ondansetron (ZOFRAN) injection 4 mg, 4 mg, Intravenous, Q6H PRN, Cox, Amy N, DO   polyethylene glycol (MIRALAX / GLYCOLAX) packet 17 g, 17 g, Oral, BID, Cox, Amy N, DO, 17 g at 05/19/21 1106   senna (SENOKOT) tablet 17.2 mg, 2 tablet, Oral, QHS, Cox, Amy N, DO, 17.2 mg at 05/19/21 0028   sevelamer carbonate (RENVELA) tablet 1,600-2,400 mg, 1,600-2,400 mg, Oral, TID with meals, Cox, Amy N, DO, 1,600 mg at 05/19/21 1106  Current Outpatient Medications:    amLODipine (NORVASC) 10 MG tablet, Take 10 mg by mouth daily., Disp: , Rfl:    apixaban (ELIQUIS) 2.5 MG TABS tablet, Take 1 tablet (2.5 mg total) by mouth 2 (two) times daily., Disp: 60 tablet, Rfl: 0   ASPIRIN LOW DOSE 81 MG EC tablet, Take 81 mg by mouth daily., Disp: , Rfl:    atorvastatin (LIPITOR) 80 MG tablet, Take 1 tablet (80 mg total) by mouth daily., Disp: 30 tablet, Rfl: 0   atorvastatin (LIPITOR) 80 MG tablet, Take 80 mg by mouth daily., Disp: , Rfl:    b complex-vitamin c-folic acid (NEPHRO-VITE) 0.8 MG TABS tablet, Take 1 tablet by mouth daily., Disp: , Rfl: 11   b complex-vitamin c-folic acid (NEPHRO-VITE) 0.8 MG TABS tablet, Take 1 tablet by mouth daily., Disp: , Rfl:    bisacodyl (DULCOLAX) 10 MG suppository, Place 1 suppository (10 mg total) rectally daily as needed for moderate constipation., Disp: 12 suppository, Rfl: 0   carvedilol (COREG) 25 MG tablet,  Take 25 mg by mouth 2 (two) times daily., Disp: , Rfl:    cinacalcet (SENSIPAR) 60 MG tablet, Take 60 mg by mouth 2 (two) times daily., Disp: , Rfl:    docusate sodium (COLACE) 100 MG capsule, Take 100 mg by mouth 2 (two) times daily., Disp: , Rfl:    ELIQUIS 2.5 MG TABS tablet, Take 2.5 mg by mouth 2 (two) times daily., Disp: , Rfl:    epoetin alfa (EPOGEN) 4000 UNIT/ML injection, Inject 1 mL (4,000 Units total) into the vein Every Tuesday,Thursday,and Saturday with dialysis., Disp: 1 mL, Rfl:    fluticasone (FLONASE) 50 MCG/ACT nasal spray, Place 1-2 sprays into both nostrils daily at 12 noon., Disp: ,  Rfl:    gabapentin (NEURONTIN) 300 MG capsule, Take 300 mg by mouth 3 (three) times daily., Disp: , Rfl:    HYDROcodone-acetaminophen (NORCO/VICODIN) 5-325 MG tablet, Take 1 tablet by mouth 2 (two) times daily as needed for pain., Disp: , Rfl:    hydrocortisone cream 1 %, Apply 1 application topically 2 (two) times daily., Disp: 30 g, Rfl: 0   Hydrocortisone, Perianal, 1 % CREA, Apply topically., Disp: , Rfl:    hydroxychloroquine (PLAQUENIL) 200 MG tablet, Take 200 mg by mouth daily., Disp: , Rfl:    hydroxychloroquine (PLAQUENIL) 200 MG tablet, Take 200 mg by mouth daily., Disp: , Rfl:    midodrine (PROAMATINE) 10 MG tablet, Take 1 tablet (10 mg total) by mouth 3 (three) times daily with meals., Disp: 90 tablet, Rfl: 0   midodrine (PROAMATINE) 10 MG tablet, Take 10 mg by mouth 3 (three) times daily., Disp: , Rfl:    polyethylene glycol (MIRALAX / GLYCOLAX) 17 g packet, Take 17 g by mouth 2 (two) times daily., Disp: 14 each, Rfl: 0   senna (SENOKOT) 8.6 MG tablet, Take 2 tablets by mouth at bedtime., Disp: , Rfl:    sevelamer carbonate (RENVELA) 800 MG tablet, Take 1,600-2,400 mg by mouth 3 (three) times daily. Take 3 tabs with meals and 2 tabs with snacks, Disp: , Rfl:    cefdinir (OMNICEF) 300 MG capsule, Take 1 capsule (300 mg total) by mouth 2 (two) times daily. (Patient not taking: No sig  reported), Disp: 6 capsule, Rfl: 0   Nutritional Supplements (FEEDING SUPPLEMENT, NEPRO CARB STEADY,) LIQD, Take 237 mLs by mouth 3 (three) times daily between meals., Disp: , Rfl: 0   sevelamer carbonate (RENVELA) 800 MG tablet, Take 800 mg by mouth 4 (four) times daily. (Patient not taking: No sig reported), Disp: , Rfl:    Assessment/ Plan:  75 y.o. female with   hypertension, anemia, multiple myeloma, chronic respiratory failure on home O2, and end stage renal disease on dialysis  admitted on 05/18/2021 for Volume overload [E87.70]  [facility]/TTS/Rt lower forearm AVF  #Shortness of breath with volume overload/acute pulmonary edema in the setting of end-stage renal disease.  Patient has increasing oxygen requirement.  Chest x-ray suggest volume overload. -Underwent urgent dialysis with UF of 3L removed, tolerated well -Edema improved but remains in lower extremities. Patient agreeable to sequential only treatment today for additional fluid removal. UF goal 2L, as tolerated.  Next scheduled treatment will be Thursday  #Anemia of chronic kidney disease  Lab Results  Component Value Date   HGB 8.4 (L) 05/19/2021   Hgb below target, EPO with treatments  #Secondary hyperparathyroidism Lab Results  Component Value Date   PTH 242 (H) 09/29/2020   CALCIUM 9.8 05/19/2021   PHOS 4.2 05/18/2021  Monitor calcium and phosphorus continue Cinacalcet as well as Renvela at home doses.   LOS: 0 Colon Flattery 10/12/202212:52 PM  [place], [place]  Note: This note was prepared with Dragon dictation. Any transcription errors are unintentional

## 2021-05-19 NOTE — Progress Notes (Signed)
Patient with scheduled sequential treatment. AVF + for bruit, thrill, maintained prescribed BFR, UF met 2501 fluid removal. No concerns with SOB SpO2 >90% 6 LPM via [place]. Pt admitted to floor, room 208.

## 2021-05-20 ENCOUNTER — Other Ambulatory Visit: Payer: Self-pay

## 2021-05-20 DIAGNOSIS — E8779 Other fluid overload: Secondary | ICD-10-CM | POA: Diagnosis not present

## 2021-05-20 LAB — FOLATE: Folate: 20.7 ng/mL (ref >5.9)

## 2021-05-20 LAB — IRON AND TIBC
Iron: 33 ug/dL (ref 28–170)
Saturation Ratios: 15 % (ref 10.4–31.8)
TIBC: 214 ug/dL — ABNORMAL LOW (ref 250–450)
UIBC: 181 ug/dL

## 2021-05-20 LAB — VITAMIN D 25 HYDROXY (VIT D DEFICIENCY, FRACTURES): Vit D, 25-Hydroxy: 22.02 ng/mL — ABNORMAL LOW (ref 30–100)

## 2021-05-20 MED ORDER — METOPROLOL TARTRATE 25 MG PO TABS
12.5000 mg | ORAL_TABLET | Freq: Two times a day (BID) | ORAL | Status: DC
  Administered 2021-05-20 – 2021-05-22 (×4): 12.5 mg via ORAL
  Filled 2021-05-20 (×5): qty 1

## 2021-05-20 MED ORDER — SEVELAMER CARBONATE 800 MG PO TABS
2400.0000 mg | ORAL_TABLET | Freq: Three times a day (TID) | ORAL | Status: DC
  Administered 2021-05-20 – 2021-05-22 (×5): 2400 mg via ORAL
  Filled 2021-05-20 (×3): qty 3

## 2021-05-20 MED ORDER — ORAL CARE MOUTH RINSE
15.0000 mL | Freq: Two times a day (BID) | OROMUCOSAL | Status: DC
  Administered 2021-05-20 – 2021-05-22 (×3): 15 mL via OROMUCOSAL

## 2021-05-20 MED ORDER — FUROSEMIDE 10 MG/ML IJ SOLN
100.0000 mg | Freq: Once | INTRAVENOUS | Status: AC
  Administered 2021-05-20: 100 mg via INTRAVENOUS
  Filled 2021-05-20: qty 10

## 2021-05-20 MED ORDER — RENA-VITE PO TABS
1.0000 | ORAL_TABLET | Freq: Every day | ORAL | Status: DC
  Administered 2021-05-20 – 2021-05-21 (×2): 1 via ORAL
  Filled 2021-05-20 (×3): qty 1

## 2021-05-20 MED ORDER — SEVELAMER CARBONATE 800 MG PO TABS: 1600.0000 mg | ORAL_TABLET | Freq: Three times a day (TID) | ORAL | Status: DC | PRN

## 2021-05-20 MED ORDER — LACTULOSE 10 GM/15ML PO SOLN
30.0000 g | Freq: Once | ORAL | Status: AC
  Administered 2021-05-20: 30 g via ORAL
  Filled 2021-05-20: qty 60

## 2021-05-20 MED ORDER — NEPRO/CARBSTEADY PO LIQD
237.0000 mL | Freq: Two times a day (BID) | ORAL | Status: DC
  Administered 2021-05-20 – 2021-05-22 (×4): 237 mL via ORAL

## 2021-05-20 MED ORDER — EPOETIN ALFA 4000 UNIT/ML IJ SOLN
INTRAMUSCULAR | Status: AC
  Filled 2021-05-20: qty 1

## 2021-05-20 NOTE — Progress Notes (Signed)
Pt. W/RFA AVF cannulates with ease, maintains prescribed BFR, minimal post tx. bleeding. Targeted UF met, 2 liters removed. Pt. without concern, SpO2 > 90 throughout tx on 5 LPM via [place]. Pt returned to floor. 

## 2021-05-20 NOTE — Evaluation (Signed)
Physical Therapy Evaluation Patient Details Name: Cassandra Joseph MRN: [id number] DOB: 23-Dec-1945 Today's Date: 05/20/2021  History of Present Illness  Pt is a 75 y.o. female presenting to hospital 10/11 with SOB and B LE edema.  Pt admitted with SOB most likely volume overload secondary to ESRD.  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  Clinical Impression  Prior to hospital admission, pt was using hoyer lift to transfer to manual w/c; has 24/7 assist from family and aide that comes 3x/week for 3 hrs each time; receiving HHPT 1x/week.  Currently pt is min to mod assist to logroll towards R side and max assist to logroll towards L side in bed (d/t unable to assist with R UE d/t R UE impairments).  Pt would benefit from skilled PT to address noted impairments and functional limitations (see below for any additional details).  Upon hospital discharge, pt would benefit from continuing HHPT for strengthening and improving bed level mobility.       Recommendations for follow up therapy are one component of a multi-disciplinary discharge planning process, led by the attending physician.  Recommendations may be updated based on patient status, additional functional criteria and insurance authorization.  Follow Up Recommendations  (continue HHPT)    Equipment Recommendations  Other (comment) (pt has needed DME at home)    Recommendations for Other Services       Precautions / Restrictions Precautions Precautions: Fall Precaution Comments: R UE AV fistula Restrictions Weight Bearing Restrictions: No      Mobility  Bed Mobility Overal bed mobility: Needs Assistance Bed Mobility: Rolling Rolling: Min assist;Mod assist;Max assist         General bed mobility  comments: min to mod assist to logroll towards R side (reaching L UE towards siderail to assist); max assist to logroll towards L side (pt unable to assist with R UE d/t impairments)    Transfers                    Ambulation/Gait                Stairs            Wheelchair Mobility    Modified Rankin (Stroke Patients Only)       Balance       Sitting balance - Comments: pt reports impaired sitting balance at baseline (not tested during session d/t pt reporting being too fatigued to attempt)                                     Pertinent Vitals/Pain Pain Assessment: No/denies pain Pain Intervention(s): Limited activity within patient's tolerance;Monitored during session;Repositioned Vitals (HR and O2 on supplemental O2) stable and WFL throughout treatment session.    Home Living Family/patient expects to be discharged to:: Private residence Living Arrangements: Children Available Help at Discharge: Family;Available 24 hours/day;Personal care attendant Type of Home: House Home Access: Ramped entrance     Home Layout: One level Home Equipment: Wheelchair - manual;Hospital bed;Other (comment) (hoyer lift and sling) Additional Comments: Family assists with bathing, meal preparation, and chores    Prior Function Level of Independence: Needs assistance   Gait / Transfers Assistance Needed: Family uses  hoyer lift to transfer pt to manual w/c.  Pt unable to propel manual w/c on own d/t R UE impairment (someone pushes pt).  ADL's / Homemaking Assistance Needed: Pt uses disposable brief for toileting.  Comments: Has an aide that comes 3x/week for 3 hours each time to assist with self care and needs; also has 24/7 family assist.  Has transport to dialysis.     Hand Dominance        Extremity/Trunk Assessment   Upper Extremity Assessment Upper Extremity Assessment: RUE deficits/detail;Generalized weakness RUE Deficits / Details: R UE  impairments decreasing functionality of R UE RUE Coordination: decreased gross motor;decreased fine motor    Lower Extremity Assessment Lower Extremity Assessment: Generalized weakness    Cervical / Trunk Assessment Cervical / Trunk Assessment: Normal  Communication   Communication: No difficulties  Cognition Arousal/Alertness: Awake/alert Behavior During Therapy: WFL for tasks assessed/performed Overall Cognitive Status: Within Functional Limits for tasks assessed                                        General Comments  Pt agreeable to limited PT session d/t fatigue post dialysis.    Exercises     Assessment/Plan    PT Assessment Patient needs continued PT services  PT Problem List Decreased strength;Decreased activity tolerance;Decreased mobility       PT Treatment Interventions DME instruction;Functional mobility training;Therapeutic activities;Therapeutic exercise;Balance training;Patient/family education    PT Goals (Current goals can be found in the Care Plan section)  Acute Rehab PT Goals Patient Stated Goal: to go home PT Goal Formulation: With patient Time For Goal Achievement: 06/03/21 Potential to Achieve Goals: Good    Frequency Min 2X/week   Barriers to discharge        Co-evaluation               AM-PAC PT "6 Clicks" Mobility  Outcome Measure Help needed turning from your back to your side while in a flat bed without using bedrails?: A Lot Help needed moving from lying on your back to sitting on the side of a flat bed without using bedrails?: A Lot Help needed moving to and from a bed to a chair (including a wheelchair)?: Total Help needed standing up from a chair using your arms (e.g., wheelchair or bedside chair)?: Total Help needed to walk in hospital room?: Total Help needed climbing 3-5 steps with a railing? : Total 6 Click Score: 8    End of Session   Activity Tolerance: Patient tolerated treatment well Patient  left: in bed;with call bell/phone within reach;with bed alarm set Nurse Communication: Precautions PT Visit Diagnosis: Other abnormalities of gait and mobility (R26.89);Muscle weakness (generalized) (M62.81)    Time: 1355-1410 PT Time Calculation (min) (ACUTE ONLY): 15 min   Charges:   PT Evaluation $PT Eval Low Complexity: 1 Low         Emily Hauschild, PT 05/20/21, 4:30 PM

## 2021-05-20 NOTE — Progress Notes (Signed)
PT Cancellation Note  Patient Details Name: Cassandra Joseph MRN: [id number] DOB: 02/17/1946   Cancelled Treatment:    Reason Eval/Treat Not Completed: Patient at procedure or test/unavailable.  PT consult received.  Chart reviewed.  Pt currently off unit at dialysis.  Will re-attempt PT evaluation at a later date/time.  Leitha Bleak, PT 05/20/21, 9:06 AM

## 2021-05-20 NOTE — Progress Notes (Signed)
OT Screen Note  Patient Details Name: Cassandra Joseph MRN: [id number] DOB: [birth year]   Cancelled Treatment:    Reason Eval/Treat Not Completed: OT screened, no needs identified, will sign off. Consult received, chart reviewed. Known to therapy from previous admission. Pt baseline requires significant assist with all aspects of care, able to self feed using LUE. Pt set up with crackers and applesauce per pt request and with RN approval and able to self feed at baseline mod indep. Required MAX A +2 to scoot up and reposition in bed. Pt reports having hospital bed at home and endorses feeling at baseline at this time. No skilled acute OT needs identified at this time. Pt eager to return home with assist and HHPT. Will sign off.   Ardeth Perfect., MPH, MS, OTR/L ascom [phone] 05/20/21, 3:26 PM

## 2021-05-20 NOTE — Progress Notes (Addendum)
[facility] [place], [place] 05/20/21  Subjective:   Hospital day # 1   Patient known to our practice from previous admissions.  This time presents to the emergency room for dyspnea on exertion.  She finished her dialysis on Saturday but has been short of breath over the weekend.  This morning she was sent from her dialysis center to the emergency room over concerns about her breathing status.  Evaluation in the ER shows increasing oxygen requirements from her baseline of 3 L up to 5 L today.  Chest x-ray shows bibasilar opacities increased on the right.  BNP is elevated at 1458.  Emergency room has consulted nephrology for evaluation of urgent hemodialysis.  Patient seen and evaluated during dialysis   HEMODIALYSIS FLOWSHEET:  Blood Flow Rate (mL/min): 400 mL/min Arterial Pressure (mmHg): -190 mmHg Venous Pressure (mmHg): 170 mmHg Transmembrane Pressure (mmHg): 70 mmHg Ultrafiltration Rate (mL/min): 840 mL/min Dialysate Flow Rate (mL/min): 500 ml/min Conductivity: Machine : 13.4 Conductivity: Machine : 13.4  Patient feels like she still has "fluid on" States breathing is not at baseline Also complains of constipation   Objective:  Vital signs in last 24 hours:  Temp:  [97.5 F (36.4 C)-98.8 F (37.1 C)] 98.4 F (36.9 C) (10/13 0855) Pulse Rate:  [50-103] 62 (10/13 1015) Resp:  [13-26] 23 (10/13 1250) BP: (105-140)/(46-101) 134/59 (10/13 1245) SpO2:  [90 %-100 %] 94 % (10/13 0814) Weight:  [111 kg] 111 kg (10/13 0855)  Weight change:  Filed Weights   05/18/21 1159 05/20/21 0855  Weight: 113.4 kg 111 kg    Intake/Output:    Intake/Output Summary (Last 24 hours) at 05/20/2021 1320 Last data filed at 05/20/2021 1245 Gross per 24 hour  Intake --  Output 4002 ml  Net -4002 ml      Physical Exam: General: No acute distress lying in the bed  HEENT Moist oral mucous membranes  Pulm/lungs Bilateral pulmonary crackles  CVS/Heart Irregular  rhythm, A. fib  Abdomen:  Soft, nontender  Extremities: 1+ edema over the legs bilaterally  Neurologic: Alert, oriented  Skin: No acute rashes  Access: Right forearm AV fistula       Basic Metabolic Panel:  Recent Labs  Lab 05/18/21 1225 05/19/21 0725  NA 139 138  K 4.1 3.9  CL 95* 98  CO2 33* 30  GLUCOSE 84 77  BUN 45* 28*  CREATININE 6.44* 4.21*  CALCIUM 10.3 9.8  MG 2.2  --   PHOS 4.2  --       CBC: Recent Labs  Lab 05/18/21 1225 05/19/21 0725  WBC 6.3 5.2  NEUTROABS 4.1  --   HGB 9.6* 8.4*  HCT 29.2* 26.7*  MCV 100.0 101.9*  PLT 174 144*       Lab Results  Component Value Date   HEPBSAG NON REACTIVE 04/29/2021   HEPBSAB Reactive (A) 04/29/2021      Microbiology:  Recent Results (from the past 240 hour(s))  Resp Panel by RT-PCR (Flu A&B, Covid) Nasopharyngeal Swab     Status: None   Collection Time: 05/18/21  2:12 PM   Specimen: Nasopharyngeal Swab; Nasopharyngeal(NP) swabs in vial transport medium  Result Value Ref Range Status   SARS Coronavirus 2 by RT PCR NEGATIVE NEGATIVE Final    Comment: (NOTE) SARS-CoV-2 target nucleic acids are NOT DETECTED.  The SARS-CoV-2 RNA is generally detectable in upper respiratory specimens during the acute phase of infection. The lowest concentration of SARS-CoV-2 viral copies this assay can detect is   138 copies/mL. A negative result does not preclude SARS-Cov-2 infection and should not be used as the sole basis for treatment or other patient management decisions. A negative result may occur with  improper specimen collection/handling, submission of specimen other than nasopharyngeal swab, presence of viral mutation(s) within the areas targeted by this assay, and inadequate number of viral copies(<138 copies/mL). A negative result must be combined with clinical observations, patient history, and epidemiological information. The expected result is Negative.  Fact Sheet for Patients:   https://www.fda.gov/media/152166/download  Fact Sheet for Healthcare Providers:  https://www.fda.gov/media/152162/download  This test is no t yet approved or cleared by the [place] FDA and  has been authorized for detection and/or diagnosis of SARS-CoV-2 by FDA under an Emergency Use Authorization (EUA). This EUA will remain  in effect (meaning this test can be used) for the duration of the COVID-19 declaration under Section 564(b)(1) of the Act, 21 U.S.C.section 360bbb-3(b)(1), unless the authorization is terminated  or revoked sooner.       Influenza A by PCR NEGATIVE NEGATIVE Final   Influenza B by PCR NEGATIVE NEGATIVE Final    Comment: (NOTE) The Xpert Xpress SARS-CoV-2/FLU/RSV plus assay is intended as an aid in the diagnosis of influenza from Nasopharyngeal swab specimens and should not be used as a sole basis for treatment. Nasal washings and aspirates are unacceptable for Xpert Xpress SARS-CoV-2/FLU/RSV testing.  Fact Sheet for Patients: https://www.fda.gov/media/152166/download  Fact Sheet for Healthcare Providers: https://www.fda.gov/media/152162/download  This test is not yet approved or cleared by the [place] FDA and has been authorized for detection and/or diagnosis of SARS-CoV-2 by FDA under an Emergency Use Authorization (EUA). This EUA will remain in effect (meaning this test can be used) for the duration of the COVID-19 declaration under Section 564(b)(1) of the Act, 21 U.S.C. section 360bbb-3(b)(1), unless the authorization is terminated or revoked.  Performed at [facility], [address] [place], [place] [address]     Coagulation Studies: No results for input(s): LABPROT, INR in the last 72 hours.  Urinalysis: No results for input(s): COLORURINE, LABSPEC, PHURINE, GLUCOSEU, HGBUR, BILIRUBINUR, KETONESUR, PROTEINUR, UROBILINOGEN, NITRITE, LEUKOCYTESUR in the last 72 hours.  Invalid input(s): APPERANCEUR    Imaging: No  results found.   Medications:     Current Facility-Administered Medications:    acetaminophen (TYLENOL) tablet 650 mg, 650 mg, Oral, Q6H PRN **OR** acetaminophen (TYLENOL) suppository 650 mg, 650 mg, Rectal, Q6H PRN, Cox, Amy N, DO   apixaban (ELIQUIS) tablet 2.5 mg, 2.5 mg, Oral, BID, Cox, Amy N, DO, 2.5 mg at 05/20/21 1318   aspirin EC tablet 81 mg, 81 mg, Oral, Daily, Cox, Amy N, DO, 81 mg at 05/20/21 1318   atorvastatin (LIPITOR) tablet 80 mg, 80 mg, Oral, Daily, Cox, Amy N, DO, 80 mg at 05/20/21 1316   bisacodyl (DULCOLAX) suppository 10 mg, 10 mg, Rectal, Daily PRN, Cox, Amy N, DO   Chlorhexidine Gluconate Cloth 2 % PADS 6 each, 6 each, Topical, Q0600, Cox, Amy N, DO, 6 each at 05/20/21 1318   cinacalcet (SENSIPAR) tablet 60 mg, 60 mg, Oral, BID WC, Cox, Amy N, DO, 60 mg at 05/19/21 1102   docusate sodium (COLACE) capsule 100 mg, 100 mg, Oral, BID, Cox, Amy N, DO, 100 mg at 05/20/21 1316   epoetin alfa (EPOGEN) 4000 UNIT/ML injection, , , ,    epoetin alfa (EPOGEN) injection 4,000 Units, 4,000 Units, Intravenous, Q T,Th,Sa-HD, Cox, Amy N, DO, 4,000 Units at 05/20/21 1118   feeding supplement (NEPRO   CARB STEADY) liquid 237 mL, 237 mL, Oral, BID BM, Kumar, Dileep, MD, 237 mL at 05/20/21 1319   fluticasone (FLONASE) 50 MCG/ACT nasal spray 1-2 spray, 1-2 spray, Each Nare, Q1200, Cox, Amy N, DO, 1 spray at 05/19/21 1244   furosemide (LASIX) 100 mg in dextrose 5 % 50 mL IVPB, 100 mg, Intravenous, Once, Breeze, Shantelle, NP   hydroxychloroquine (PLAQUENIL) tablet 200 mg, 200 mg, Oral, Daily, Cox, Amy N, DO, 200 mg at 05/20/21 1318   lactulose (CHRONULAC) 10 GM/15ML solution 30 g, 30 g, Oral, Once, Breeze, Shantelle, NP   MEDLINE mouth rinse, 15 mL, Mouth Rinse, BID, Kumar, Dileep, MD, 15 mL at 05/20/21 1319   metoprolol tartrate (LOPRESSOR) tablet 12.5 mg, 12.5 mg, Oral, BID, Kumar, Dileep, MD, 12.5 mg at 05/20/21 1318   midodrine (PROAMATINE) tablet 10 mg, 10 mg, Oral, Q6H PRN, Morrison,  Nakai, NP, 10 mg at 05/19/21 1102   multivitamin (RENA-VIT) tablet 1 tablet, 1 tablet, Oral, QHS, Kumar, Dileep, MD   ondansetron (ZOFRAN) tablet 4 mg, 4 mg, Oral, Q6H PRN **OR** ondansetron (ZOFRAN) injection 4 mg, 4 mg, Intravenous, Q6H PRN, Cox, Amy N, DO   polyethylene glycol (MIRALAX / GLYCOLAX) packet 17 g, 17 g, Oral, BID, Cox, Amy N, DO, 17 g at 05/20/21 1316   senna (SENOKOT) tablet 17.2 mg, 2 tablet, Oral, QHS, Cox, Amy N, DO, 17.2 mg at 05/19/21 2020   sevelamer carbonate (RENVELA) tablet 1,600-2,400 mg, 1,600-2,400 mg, Oral, TID with meals, Cox, Amy N, DO, 1,600 mg at 05/19/21 1106   Assessment/ Plan:  75 y.o. female with   hypertension, anemia, multiple myeloma, chronic respiratory failure on home O2, and end stage renal disease on dialysis  admitted on 05/18/2021 for Volume overload [E87.70] Acute on chronic respiratory failure with hypoxia (HCC) [J96.21]  [facility]/TTS/Rt lower forearm AVF  #Shortness of breath with volume overload/acute pulmonary edema in the setting of end-stage renal disease.  Patient has increasing oxygen requirement.  Chest x-ray suggest volume overload. -Received dialysis today, UF goal 2L achieved. Tolerated well. Patient received extra treatment yesterday. A total of 7L, about 15 lbs has been removed. Will order IV Lasix 100mg IV once to diurese additional fluid.  Next scheduled treatment will be Saturday. Will order Lactulose 30g once for constipation  #Anemia of chronic kidney disease  Lab Results  Component Value Date   HGB 8.4 (L) 05/19/2021   Hgb below target, EPO with treatments  #Secondary hyperparathyroidism Lab Results  Component Value Date   PTH 242 (H) 09/29/2020   CALCIUM 9.8 05/19/2021   PHOS 4.2 05/18/2021  Calcium and phosphorus at goal continue Cinacalcet as well as Renvela at home doses.   LOS: 1 Shantelle Breeze 10/13/20221:20 PM  [facility] , [place] [phone]  

## 2021-05-20 NOTE — Progress Notes (Signed)
[facility] Progress Note  Patient: Cassandra Joseph    UXL:[id number]  DOA: 05/18/2021     Date of Service: the patient was seen and examined on 05/20/2021  Chief Complaint  Patient presents with   Shortness of Breath   Brief hospital course: Cassandra Joseph is a 75 y.o. female with medical history significant for hypertension, hyperlipidemia, morbid obesity, OSA on CPAP, end-stage renal disease on hemodialysis Tuesday Thursday Saturday, paroxysmal atrial fibrillation on Eliquis twice daily, who presents to the emergency department for chief concerns of shortness of breath.   Patient was at hemodialysis center.  She reported to dialysis nurse that she was short of breath.  Hemodialysis center sent patient to the emergency department for further evaluation and a dialysis session was not completed at the hemodialysis center.   Patient reports compliance with hemodialysis and her last session was on Saturday, 05/15/2021.  She reports that the full amount was removed and she received a full dialysis treatment at that time.   The shortness of breat started Saturday evening. She denies cough. The shortness is worse with exertion and laying flat. She denies fever. She endorses some pleghm that is clear. She denies chest pain, abdominal pain. She endorses consitpation and denies diarrhea and nausea. She denies blood in her stool and vomtiing. Her last bowel movement was Sunday.    She does not make urine.   ED Course: Discussed with emergency medicine provider, patient requiring hospitalization for chief concerns of shortness of breath presumed secondary to volume overload.  Vitals in the emergency department was remarkable for temperature 98, respiration rate of 25, heart rate is 75, initial blood pressure 134/62, SPO2 of 92% on 4 L nasal cannula.  Labs in the emergency department was remarkable for sodium 139, potassium 4.1, chloride 95, bicarb 33, BUN of 45, serum creatinine of 6.44, EGFR  6.  BNP was elevated at 1458.  High sensitive troponin is 68.  WBC 6.3, hemoglobin 9.6, platelets 174.  Assessment and Plan: Principal Problem:   Volume overload Active Problems:   ESRD on hemodialysis (HCC)   Essential hypertension   Multiple myeloma (HCC)   Elevated troponin   HLD (hyperlipidemia)   SOB (shortness of breath)   History of anemia due to chronic kidney disease   PAF (paroxysmal atrial fibrillation) (HCC)   Chronic respiratory failure with hypoxia (HCC)   OSA on CPAP   Obesity, Class III, BMI 40-49.9 (morbid obesity) ([place])   # Shortness of breath-most likely volume overload secondary to ESRD - procalcitonin negative  - Negative COVID/influenza A/influenza B PCR - Continuous BiPAP prn  - Nephrology consulted, patient got hemodialysis on 10/11 and 10/12 dialysis, and 10/13 scheduled HD today. Patient's hemodialysis schedule is TTS Currently patient is using 5 L oxygen via nasal cannula, at home she uses 3-4L Patient would like to stay overnight to make sure that her breathing is all right before discharge planning.    # End-stage renal disease on hemodialysis-treat as above - Patient is auric - Resumed Cinacalcet 60 mg p.o. twice daily, sevelamer home dose - Nephrology has been consulted for hemodialysis   # Hypertension-Low blood pressure Discontinued amlodipine, decreased Coreg from 25 twice daily to 3.125 twice daily with holding parameters Started midodrine  # Elevated troponin-presumed secondary to chronically hemodialysis dependent - I have no clinical suspicion for ACS at this time as EKG did not show evidence of ischemia and patient denies chest pain # OSA-BiPAP nightly ordered # Paroxysmal atrial fibrillation-apixaban 2.5  mg p.o. twice daily # Hyperlipidemia-atorvastatin 80 mg nightly # History of multiple myeloma-recommend follow-up with outpatient medical oncologist, Dr. Rogue Bussing # History of rheumatoid arthritis, Seropositive-patient takes  hydroxychloroquine 200 mg p.o. daily  Body mass index is 40.35 kg/m.  Interventions:       Diet: renal diet DVT Prophylaxis: Therapeutic Anticoagulation with Eliquis    Advance goals of care discussion: Full code  Family Communication: family was NOT present at bedside, at the time of interview.  The pt provided permission to discuss medical plan with the family. Opportunity was given to ask question and all questions were answered satisfactorily.   Disposition:  Pt is from Home, admitted with resp failure due to volume overload, still has respiratory failure, needs hemodialysis, which precludes a safe discharge. Discharge to home, when respiratory failure will improve and cleared by nephrology.. Most likely discharge tomorrow a.m.  Subjective: No significant overnight events, patient still has shortness of breath requiring 5 L oxygen via nasal cannula, patient feels improvement after hemodialysis.  Patient was seen during hemodialysis, patient would like to stay overnight and go home tomorrow if no issues with shortness of breath. Patient denies any other active issues, overall she feels improvement.   Physical Exam: General:  alert oriented to time, place, and person.  Appear in moderate distress, affect appropriate Eyes: PERRLA ENT: Oral Mucosa Clear, moist  Neck: no JVD,  Cardiovascular: S1 and S2 Present, no Murmur,  Respiratory: good respiratory effort, Bilateral Air entry equal and Decreased, mild crackles bilaterally, no significant wheezing appreciated  Abdomen: Bowel Sound present, Soft and no tenderness,  Skin: no raeshe Extremities: no Pedal edema, no calf tenderness Neurologic: without any new focal findings Gait not checked due to patient safety concerns  Vitals:   05/20/21 1200 05/20/21 1215 05/20/21 1245 05/20/21 1250  BP: 122/66 133/66 (!) 134/59   Pulse:      Resp: 19 13 (!) 21 (!) 23  Temp:      TempSrc:      SpO2:      Weight:      Height:         Intake/Output Summary (Last 24 hours) at 05/20/2021 1437 Last data filed at 05/20/2021 1245 Gross per 24 hour  Intake --  Output 4002 ml  Net -4002 ml   Filed Weights   05/18/21 1159 05/20/21 0855  Weight: 113.4 kg 111 kg    Data Reviewed: I have personally reviewed and interpreted daily labs, tele strips, imagings as discussed above. I reviewed all nursing notes, pharmacy notes, vitals, pertinent old records I have discussed plan of care as described above with RN and patient/family.  CBC: Recent Labs  Lab 05/18/21 1225 05/19/21 0725  WBC 6.3 5.2  NEUTROABS 4.1  --   HGB 9.6* 8.4*  HCT 29.2* 26.7*  MCV 100.0 101.9*  PLT 174 683*   Basic Metabolic Panel: Recent Labs  Lab 05/18/21 1225 05/19/21 0725  NA 139 138  K 4.1 3.9  CL 95* 98  CO2 33* 30  GLUCOSE 84 77  BUN 45* 28*  CREATININE 6.44* 4.21*  CALCIUM 10.3 9.8  MG 2.2  --   PHOS 4.2  --     Studies: No results found.  Scheduled Meds:  apixaban  2.5 mg Oral BID   aspirin EC  81 mg Oral Daily   atorvastatin  80 mg Oral Daily   Chlorhexidine Gluconate Cloth  6 each Topical Q0600   cinacalcet  60 mg Oral BID WC  docusate sodium  100 mg Oral BID   epoetin alfa       epoetin (EPOGEN/PROCRIT) injection  4,000 Units Intravenous Q T,Th,Sa-HD   feeding supplement (NEPRO CARB STEADY)  237 mL Oral BID BM   fluticasone  1-2 spray Each Nare Q1200   hydroxychloroquine  200 mg Oral Daily   mouth rinse  15 mL Mouth Rinse BID   metoprolol tartrate  12.5 mg Oral BID   multivitamin  1 tablet Oral QHS   polyethylene glycol  17 g Oral BID   senna  2 tablet Oral QHS   sevelamer carbonate  2,400 mg Oral TID with meals   Continuous Infusions:  furosemide     PRN Meds: acetaminophen **OR** acetaminophen, bisacodyl, midodrine, ondansetron **OR** ondansetron (ZOFRAN) IV, sevelamer carbonate  Time spent: 35 minutes  Author: Val Riles. MD [facility] 05/20/2021 2:37 PM  To reach On-call, see care  teams to locate the attending and reach out to them via [email or web address]. If 7PM-7AM, please contact night-coverage If you still have difficulty reaching the attending provider, please page the [facility] (Director on Call) for [facility] on amion for assistance.

## 2021-05-20 NOTE — Progress Notes (Signed)
OT Cancellation Note  Patient Details Name: Cassandra Joseph MRN: [id number] DOB: 07/17/1946   Cancelled Treatment:    Reason Eval/Treat Not Completed: Patient at procedure or test/ unavailable. Consult received, chart reviewed. Pt currently off unit at dialysis. Will re-attempt OT evaluation at later date/time when pt is available and medically appropriate.   Ardeth Perfect., MPH, MS, OTR/L ascom [phone] 05/20/21, 9:17 AM

## 2021-05-20 NOTE — Progress Notes (Signed)
Initial Nutrition Assessment  DOCUMENTATION CODES:   Obesity unspecified  INTERVENTION:  Adjust diet to 2g Na, no need for K restriction Nepro Shake po BID, each supplement provides 425 kcal and 19 grams protein Renavite daily  NUTRITION DIAGNOSIS:  Increased nutrient needs related to chronic illness (ESRD on HD) as evidenced by estimated needs.  GOAL:  Patient will meet greater than or equal to 90% of their needs  MONITOR:  PO intake, Supplement acceptance, I & O's, Labs  REASON FOR ASSESSMENT:  Malnutrition Screening Tool    ASSESSMENT:  75 y.o. female with history of ESRD on HD TTS, anemia of CKD, CHF, PAF on Eliquis, multiple myeloma (undergoing chemotherapy), chronic respiratory failure on home O2 at 3 L, HTN, HLD, Hx stroke, gout, and rheumatoid arthritis presented to ED with SOB from her HD clinic. Workup in ED suggests volume overload.  Pt recently admitted with similar presentation and was followed by RD at that time. Pt reports SOB had been ongoing for several days PTA. Received HD in ED 10/11 and currently out of room for same.   Noted, pt does not make urine at baseline and is on cinacalcet and sevelamer at home.  Unable to interview or exam pt at this time as she is in HD. No intake has been recorded yet this admission, but pt reported poor appetite and weight loss on admission screen. Pt has frequently been volume overloaded, but upon review of weights it looks like pt's usual is ~118 kg. Based on current weight pt has experienced a 5.9% weight loss in the last month (9/17-10/13) which is significant for that time frame.  Reviewed labs and pt's K is WNL. Will Liberalize to a 2g Na diet to provide more dining options. Will continue fluid restriction as pt does not make urine. Will also add supplements to augment intake.  Nutritionally Relevant Medications: Scheduled Meds:  atorvastatin  80 mg Oral Daily   cinacalcet  60 mg Oral BID WC   docusate sodium  100 mg Oral  BID   polyethylene glycol  17 g Oral BID   senna  2 tablet Oral QHS   sevelamer carbonate  1,600-2,400 mg Oral TID with meals   PRN Meds: bisacodyl, ondansetron  Labs Reviewed: BUN 28, creatinine 4.21  NUTRITION - FOCUSED PHYSICAL EXAM: Defer to in-person assessment  Diet Order:   Diet Order             Diet renal with fluid restriction Fluid restriction: 1200 mL Fluid; Room service appropriate? Yes; Fluid consistency: Thin  Diet effective now                   EDUCATION NEEDS:  No education needs have been identified at this time  Skin:  Skin Assessment: Reviewed RN Assessment  Last BM:  10/7 per pt report  Height:  Ht Readings from Last 1 Encounters:  05/20/21 5' 6"  (1.676 m)    Weight: Wt Readings from Last 1 Encounters:  05/20/21 111 kg    Ideal Body Weight:  59.1 kg  BMI:  Body mass index is 39.5 kg/m.  Estimated Nutritional Needs:  Kcal:  2000-2200 kcal/d Protein:  100-115 g/d Fluid:  1L  Ranell Patrick, RD, LDN Clinical Dietitian RD pager # available in [place]  After hours/weekend pager # available in [facility]

## 2021-05-21 DIAGNOSIS — E8779 Other fluid overload: Secondary | ICD-10-CM | POA: Diagnosis not present

## 2021-05-21 MED ORDER — VITAMIN D (ERGOCALCIFEROL) 1.25 MG (50000 UNIT) PO CAPS
50000.0000 [IU] | ORAL_CAPSULE | ORAL | Status: DC
Start: 2021-05-21 — End: 2021-05-22
  Administered 2021-05-21: 50000 [IU] via ORAL
  Filled 2021-05-21: qty 1

## 2021-05-21 MED ORDER — TORSEMIDE 100 MG PO TABS
100.0000 mg | ORAL_TABLET | ORAL | Status: DC
  Administered 2021-05-21: 100 mg via ORAL
  Filled 2021-05-21: qty 1

## 2021-05-21 NOTE — Progress Notes (Signed)
Physical Therapy Treatment Patient Details Name: Cassandra Joseph MRN: [id number] DOB: 09/20/1945 Today's Date: 05/21/2021   History of Present Illness Pt is a 75 y.o. female presenting to hospital 10/11 with SOB and B LE edema.  Pt admitted with SOB most likely volume overload secondary to ESRD.  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    PT Comments    Pt resting in bed upon PT arrival; agreeable to PT session.  Performed B LE ex's in bed with assist as needed (pt requiring pacing/rest breaks at times d/t SOB).  O2 sats 92% or greater on 5 L O2 via nasal cannula during sessions activities.  Limited session d/t pt's lunch arrived and pt requesting to eat.  Will continue to focus on strengthening and bed level mobility per pt tolerance.    Recommendations for follow up therapy are one component of a multi-disciplinary discharge planning process, led by the attending physician.  Recommendations may be updated based on patient status, additional functional criteria and insurance authorization.  Follow Up Recommendations   (continue HHPT)     Equipment Recommendations  Other (comment) (pt has needed DME at home)    Recommendations for Other Services       Precautions / Restrictions Precautions Precautions: Fall Precaution Comments: R UE AV fistula Restrictions Weight Bearing Restrictions: No     Mobility  Bed Mobility                    Transfers                    Ambulation/Gait                 Stairs             Wheelchair Mobility    Modified Rankin (Stroke Patients Only)       Balance                                            Cognition Arousal/Alertness: Awake/alert Behavior During  Therapy: WFL for tasks assessed/performed Overall Cognitive Status: Within Functional Limits for tasks assessed                                        Exercises General Exercises - Lower Extremity Ankle Circles/Pumps: AROM;Strengthening;Both;10 reps;Supine Quad Sets: AROM;Strengthening;Both;10 reps;Supine Gluteal Sets: AROM;Strengthening;Both;10 reps;Supine Short Arc Quad: AROM;Strengthening;Both;10 reps;Supine Heel Slides: AAROM;Strengthening;Both;10 reps;Supine Hip ABduction/ADduction: AAROM;Strengthening;Both;10 reps;Supine    General Comments  Nursing cleared pt for participation in physical therapy.  Pt agreeable to PT session.  Pt requiring assist to cut up her chicken (for lunch) d/t R UE impairments.      Pertinent Vitals/Pain Pain Assessment: No/denies pain Pain Intervention(s): Limited activity within patient's tolerance;Monitored during session;Repositioned HR WFL during sessions activities.    Home Living                      Prior Function            PT Goals (current goals can now be found in the care  plan section) Acute Rehab PT Goals Patient Stated Goal: to go home PT Goal Formulation: With patient Time For Goal Achievement: 06/03/21 Potential to Achieve Goals: Good Progress towards PT goals: Progressing toward goals    Frequency    Min 2X/week      PT Plan Current plan remains appropriate    Co-evaluation              AM-PAC PT "6 Clicks" Mobility   Outcome Measure  Help needed turning from your back to your side while in a flat bed without using bedrails?: A Lot Help needed moving from lying on your back to sitting on the side of a flat bed without using bedrails?: A Lot Help needed moving to and from a bed to a chair (including a wheelchair)?: Total Help needed standing up from a chair using your arms (e.g., wheelchair or bedside chair)?: Total Help needed to walk in hospital room?: Total Help needed climbing  3-5 steps with a railing? : Total 6 Click Score: 8    End of Session Equipment Utilized During Treatment: Oxygen Activity Tolerance: Patient tolerated treatment well Patient left: in bed;with call bell/phone within reach;with bed alarm set Nurse Communication: Precautions PT Visit Diagnosis: Other abnormalities of gait and mobility (R26.89);Muscle weakness (generalized) (M62.81)     Time: 4081-4481 PT Time Calculation (min) (ACUTE ONLY): 23 min  Charges:  $Therapeutic Exercise: 23-37 mins                     Ally Knodel, PT 05/21/21, 1:25 PM

## 2021-05-21 NOTE — Progress Notes (Signed)
[facility] Progress Note  Patient: Cassandra Joseph    FOY:[id number]  DOA: 05/18/2021     Date of Service: the patient was seen and examined on 05/21/2021  Chief Complaint  Patient presents with   Shortness of Breath   Brief hospital course: Cassandra Joseph is a 75 y.o. female with medical history significant for hypertension, hyperlipidemia, morbid obesity, OSA on CPAP, end-stage renal disease on hemodialysis Tuesday Thursday Saturday, paroxysmal atrial fibrillation on Eliquis twice daily, who presents to the emergency department for chief concerns of shortness of breath.   Patient was at hemodialysis center.  She reported to dialysis nurse that she was short of breath.  Hemodialysis center sent patient to the emergency department for further evaluation and a dialysis session was not completed at the hemodialysis center.   Patient reports compliance with hemodialysis and her last session was on Saturday, 05/15/2021.  She reports that the full amount was removed and she received a full dialysis treatment at that time.   The shortness of breat started Saturday evening. She denies cough. The shortness is worse with exertion and laying flat. She denies fever. She endorses some pleghm that is clear. She denies chest pain, abdominal pain. She endorses consitpation and denies diarrhea and nausea. She denies blood in her stool and vomtiing. Her last bowel movement was Sunday.    She does not make urine.   ED Course: Discussed with emergency medicine provider, patient requiring hospitalization for chief concerns of shortness of breath presumed secondary to volume overload.  Vitals in the emergency department was remarkable for temperature 98, respiration rate of 25, heart rate is 75, initial blood pressure 134/62, SPO2 of 92% on 4 L nasal cannula.  Labs in the emergency department was remarkable for sodium 139, potassium 4.1, chloride 95, bicarb 33, BUN of 45, serum creatinine of 6.44, EGFR  6.  BNP was elevated at 1458.  High sensitive troponin is 68.  WBC 6.3, hemoglobin 9.6, platelets 174.  Assessment and Plan: Principal Problem:   Volume overload Active Problems:   ESRD on hemodialysis (HCC)   Essential hypertension   Multiple myeloma (HCC)   Elevated troponin   HLD (hyperlipidemia)   SOB (shortness of breath)   History of anemia due to chronic kidney disease   PAF (paroxysmal atrial fibrillation) (HCC)   Chronic respiratory failure with hypoxia (HCC)   OSA on CPAP   Obesity, Class III, BMI 40-49.9 (morbid obesity) ([place])   # Shortness of breath-most likely volume overload secondary to ESRD - procalcitonin negative  - Negative COVID/influenza A/influenza B PCR - Continuous BiPAP prn  - Nephrology consulted, patient got hemodialysis on 10/11 and 10/12 dialysis, and 10/13 scheduled HD today. Patient's hemodialysis schedule is TTS Currently patient is using 5 L oxygen via nasal cannula, at home she uses 3-4L Patient would like to stay overnight and get hemodialysis done tomorrow a.m. and then decide according to improvement in her breathing.     # End-stage renal disease on hemodialysis-treat as above - Patient is auric - Resumed Cinacalcet 60 mg p.o. twice daily, sevelamer home dose - Nephrology has been consulted for hemodialysis   # Hypertension-Low blood pressure Discontinued amlodipine, decreased Coreg from 25 twice daily to 3.125 twice daily with holding parameters Started midodrine as needed to support blood pressure  # Elevated troponin-presumed secondary to chronically hemodialysis dependent - I have no clinical suspicion for ACS at this time as EKG did not show evidence of ischemia and patient denies chest  pain # OSA-BiPAP nightly ordered # Paroxysmal atrial fibrillation-apixaban 2.5 mg p.o. twice daily # Hyperlipidemia-atorvastatin 80 mg nightly # History of multiple myeloma-recommend follow-up with outpatient medical oncologist, Dr. Rogue Bussing #  History of rheumatoid arthritis, Seropositive-patient takes hydroxychloroquine 200 mg p.o. daily  Body mass index is 40.35 kg/m.  Interventions:       Diet: renal diet DVT Prophylaxis: Therapeutic Anticoagulation with Eliquis    Advance goals of care discussion: Full code  Family Communication: family was NOT present at bedside, at the time of interview.  The pt provided permission to discuss medical plan with the family. Opportunity was given to ask question and all questions were answered satisfactorily.   Disposition:  Pt is from Home, admitted with resp failure due to volume overload, still has respiratory failure, needs hemodialysis, which precludes a safe discharge. Discharge to home, when respiratory failure will improve and cleared by nephrology.. Most likely discharge tomorrow a.m.  Subjective: No significant overnight events, patient still has shortness of breath requiring 5 L oxygen via nasal cannula, and she does not feel back to her baseline.  Still patient feels that she has shortness of breath and does not feel comfortable going home.  Patient would like to stay for hemodialysis tomorrow a.m. and then decide for discharge, depends improvement in her breathing.  Patient denied any chest pain or palpitations, no any other active issues.    Physical Exam: General:  alert oriented to time, place, and person.  Appear in moderate distress, affect appropriate Eyes: PERRLA ENT: Oral Mucosa Clear, moist  Neck: no JVD,  Cardiovascular: S1 and S2 Present, no Murmur,  Respiratory: good respiratory effort, Bilateral Air entry equal and Decreased, mild crackles bilaterally, no significant wheezing appreciated  Abdomen: Bowel Sound present, Soft and no tenderness,  Skin: no raeshe Extremities: no Pedal edema, no calf tenderness Neurologic: without any new focal findings Gait not checked due to patient safety concerns  Vitals:   05/21/21 0510 05/21/21 0635 05/21/21 0759  05/21/21 1543  BP: (!) 104/58  (!) 121/58 110/65  Pulse: 66  64 63  Resp: 17  18 18   Temp: 97.8 F (36.6 C)  98.2 F (36.8 C) 98.2 F (36.8 C)  TempSrc: Oral  Oral Oral  SpO2: 93%  96% 97%  Weight:  109.7 kg    Height:        Intake/Output Summary (Last 24 hours) at 05/21/2021 1549 Last data filed at 05/20/2021 1835 Gross per 24 hour  Intake 75.08 ml  Output --  Net 75.08 ml   Filed Weights   05/18/21 1159 05/20/21 0855 05/21/21 0635  Weight: 113.4 kg 111 kg 109.7 kg    Data Reviewed: I have personally reviewed and interpreted daily labs, tele strips, imagings as discussed above. I reviewed all nursing notes, pharmacy notes, vitals, pertinent old records I have discussed plan of care as described above with RN and patient/family.  CBC: Recent Labs  Lab 05/18/21 1225 05/19/21 0725  WBC 6.3 5.2  NEUTROABS 4.1  --   HGB 9.6* 8.4*  HCT 29.2* 26.7*  MCV 100.0 101.9*  PLT 174 292*   Basic Metabolic Panel: Recent Labs  Lab 05/18/21 1225 05/19/21 0725  NA 139 138  K 4.1 3.9  CL 95* 98  CO2 33* 30  GLUCOSE 84 77  BUN 45* 28*  CREATININE 6.44* 4.21*  CALCIUM 10.3 9.8  MG 2.2  --   PHOS 4.2  --     Studies: No results found.  Scheduled  Meds:  apixaban  2.5 mg Oral BID   aspirin EC  81 mg Oral Daily   atorvastatin  80 mg Oral Daily   Chlorhexidine Gluconate Cloth  6 each Topical Q0600   cinacalcet  60 mg Oral BID WC   docusate sodium  100 mg Oral BID   epoetin (EPOGEN/PROCRIT) injection  4,000 Units Intravenous Q T,Th,Sa-HD   feeding supplement (NEPRO CARB STEADY)  237 mL Oral BID BM   fluticasone  1-2 spray Each Nare Q1200   hydroxychloroquine  200 mg Oral Daily   mouth rinse  15 mL Mouth Rinse BID   metoprolol tartrate  12.5 mg Oral BID   multivitamin  1 tablet Oral QHS   polyethylene glycol  17 g Oral BID   senna  2 tablet Oral QHS   sevelamer carbonate  2,400 mg Oral TID with meals   torsemide  100 mg Oral Q M,W,F   Vitamin D (Ergocalciferol)   50,000 Units Oral Q7 days   Continuous Infusions:   PRN Meds: bisacodyl, midodrine, ondansetron **OR** ondansetron (ZOFRAN) IV, sevelamer carbonate  Time spent: 35 minutes  Author: Val Riles. MD [facility] 05/21/2021 3:49 PM  To reach On-call, see care teams to locate the attending and reach out to them via [email or web address]. If 7PM-7AM, please contact night-coverage If you still have difficulty reaching the attending provider, please page the [facility] (Director on Call) for [facility] on amion for assistance.

## 2021-05-21 NOTE — Care Management Important Message (Signed)
Important Message  Patient Details  Name: Cassandra Joseph MRN: [id number] Date of Birth: September 19, 1945   Medicare Important Message Given:  N/A - LOS <3 / Initial given by admissions     Dannette Barbara 05/21/2021, 9:09 AM

## 2021-05-21 NOTE — TOC Initial Note (Signed)
Transition of Care [facility]) - Initial/Assessment Note    Patient Details  Name: Cassandra Joseph MRN: [id number] Date of Birth: May 07, 1946  [facility]) CM/SW Contact:    Candie Chroman, LCSW Phone Number: 05/21/2021, 12:20 PM  Clinical Narrative:  Readmission prevention screen complete. CSW met with patient. No supports at bedside. CSW introduced role and explained that discharge planning would be discussed. Patient lives alone but said her daughters or granddaughter are always with her. PCP is Dr. Ginette Pitman and patient uses non-emergency ambulance to get to appointments. She uses [facility] to get to HD on Tuesdays and Thursdays and another transport service on Saturdays. She is hoping to get this changed. [facility] [place]. No issues obtaining medications. Patient is active with [facility] for PT and RN. She also has an aide that comes three times per week for three hours. Patient has a wheelchair and hoyer lift at home. She has a bedside commode and shower chair but does not use them. Patient is on 3 L chronic oxygen through [employer]. She has been using 4-5 liters here. Called [employer] and they said as long as she does not need more than 5 L, she can continue using current equipment. Patient will need EMS transport home and confirmed address on facesheet is correct. She confirmed family will be home to let her in. No further concerns. CSW encouraged patient to contact CSW as needed. CSW will continue to follow patient for support and facilitate return home when stable.                Expected Discharge Plan: [place] Barriers to Discharge: Continued Medical Work up   Patient Goals and CMS Choice     Choice offered to / list presented to : NA  Expected Discharge Plan and Services Expected Discharge Plan: [place] Acute Care Choice: Resumption of Svcs/PTA Provider Living arrangements for the past 2 months: [place] Arranged: RN, PT [facility] Agency: [facility] ([place]) Date HH Agency Contacted: 05/20/21   Representative spoke with at [place]: Floydene Flock  Prior Living Arrangements/Services Living arrangements for the past 2 months: [place] Lives with:: Self Patient language and need for interpreter reviewed:: Yes Do you feel safe going back to the place where you live?: Yes      Need for Family Participation in Patient Care: Yes (Comment) Care giver support system in place?: Yes (comment) Current home services: DME, Home PT, Home RN, Homehealth aide Criminal Activity/Legal Involvement Pertinent to Current Situation/Hospitalization: No - Comment as needed  Activities of Daily Living Home Assistive Devices/Equipment: CPAP, Oxygen, Wheelchair ADL Screening (condition at time of admission) Patient's cognitive ability adequate to safely complete daily activities?: Yes Is the patient deaf or have difficulty hearing?: No Does the patient have difficulty seeing, even when wearing glasses/contacts?: No Does the patient have difficulty concentrating, remembering, or making decisions?: No Patient able to express need for assistance with ADLs?: Yes Does the patient have difficulty dressing or bathing?: Yes Independently performs ADLs?: No Communication: Independent Dressing (OT): Needs assistance Is this a change from baseline?: Pre-admission baseline Grooming: Needs assistance Is this a change from baseline?: Pre-admission baseline Feeding: Needs assistance Is this a change from baseline?: Pre-admission baseline  Bathing: Needs assistance Is this a change from baseline?: Pre-admission baseline Toileting: Needs assistance Is this a change from baseline?: Pre-admission baseline In/Out Bed: Dependent Is this a change from baseline?: Pre-admission baseline Walks in Home: Dependent Is this a change from baseline?: Pre-admission baseline Does the  patient have difficulty walking or climbing stairs?: Yes Weakness of Legs: Both Weakness of Arms/Hands: Right  Permission Sought/Granted Permission sought to share information with : Facility Art therapist granted to share information with : Yes, Verbal Permission Granted     Permission granted to share info w AGENCY: [facility]        Emotional Assessment Appearance:: Appears stated age Attitude/Demeanor/Rapport: Engaged, Gracious Affect (typically observed): Accepting, Appropriate, Calm, Pleasant Orientation: : Oriented to Self, Oriented to Place, Oriented to  Time, Oriented to Situation Alcohol / Substance Use: Not Applicable Psych Involvement: No (comment)  Admission diagnosis:  Volume overload [E87.70] Acute on chronic respiratory failure with hypoxia (HCC) [J96.21] Patient Active Problem List   Diagnosis Date Noted   Volume overload 05/18/2021   ESRD on hemodialysis ([place]) 04/28/2021   PAF (paroxysmal atrial fibrillation) ([place]) 04/28/2021   Chronic anticoagulation 04/28/2021   Chronic respiratory failure with hypoxia ([place]) 04/28/2021   Rheumatoid arthritis ([place]) 04/28/2021   OSA on CPAP 04/28/2021   Obesity, Class III, BMI 40-49.9 (morbid obesity) ([place]) 04/28/2021   Multiple myeloma in relapse ([place]) 04/28/2021   Multifocal pneumonia 04/28/2021   CHF (congestive heart failure) ([place]) 04/28/2021   Effusion of left hip 09/28/2020   Septic arthritis ([place]) 09/27/2020   Rheumatoid arthritis with positive rheumatoid factor ([place]) 09/27/2020   Left leg pain    Viral pneumonia 08/08/2020   Pneumonia due to COVID-19 virus 08/07/2020   History of anemia due to chronic kidney disease    ESRD (end stage renal disease) (HCC)    SOB (shortness of breath)    Acute pulmonary edema ([place]) 06/23/2019   Acute on chronic diastolic CHF (congestive heart failure) ([place]) 01/18/2017   HLD (hyperlipidemia) 01/18/2017   Atypical chest pain 11/09/2016   ESRD on  hemodialysis ([place]) 11/09/2016   Morbid obesity ([place]) 11/09/2016   Essential hypertension 11/09/2016   Multiple myeloma ([place]) 11/09/2016   Elevated troponin 33/83/2919   Metabolic syndrome 16/60/6004   PCP:  Tracie Harrier, MD [facility]   [id number] [address]Phone: [phone]: [phone]    Social Determinants of Health (SDOH) Interventions    Readmission Risk Interventions Readmission Risk Prevention Plan 05/21/2021 08/11/2020 06/25/2019  Transportation Screening Complete Complete Complete  PCP or Specialist Appt within 3-5 Days - Complete Complete  HRI or [place]- Complete Complete  Social Work Consult for [place] Park[place]anning/Counseling - Complete Complete  Palliative Care Screening - Not Applicable Not Applicable  Medication Review (Press photographer Complete Complete Complete  PCP or Specialist appointment within 3-5 days of discharge Complete - -  [place] Home Care Consult Complete - -  SW Recovery Care/Counseling Consult Complete - -  Palliative Care Screening Not Applicable - -  [place] Applic[place] - -  Some recent data might be hidden

## 2021-05-21 NOTE — Progress Notes (Signed)
[place], [place] 05/21/21  Subjective:   Hospital day # 2   Patient known to our practice from previous admissions.  This time presents to the emergency room for dyspnea on exertion.  She finished her dialysis on Saturday but has been short of breath over the weekend.  This morning she was sent from her dialysis center to the emergency room over concerns about her breathing status.  Evaluation in the ER shows increasing oxygen requirements from her baseline of 3 L up to 5 L today.  Chest x-ray shows bibasilar opacities increased on the right.  BNP is elevated at 1458.  Emergency room has consulted nephrology for evaluation of urgent hemodialysis.  Patient seen resting in bed this morning, alert and oriented. Reports breathing was drained this morning, but more comfortable now Peripheral edema improved Dialysis schedule tomorrow   Objective:  Vital signs in last 24 hours:  Temp:  [97.8 F (36.6 C)-98.6 F (37 C)] 98.2 F (36.8 C) (10/14 0759) Pulse Rate:  [64-72] 64 (10/14 0759) Resp:  [17-21] 18 (10/14 0759) BP: (104-127)/(51-62) 121/58 (10/14 0759) SpO2:  [93 %-96 %] 96 % (10/14 0759) Weight:  [109.7 kg] 109.7 kg (10/14 0635)  Weight change:  Filed Weights   05/18/21 1159 05/20/21 0855 05/21/21 0635  Weight: 113.4 kg 111 kg 109.7 kg    Intake/Output:    Intake/Output Summary (Last 24 hours) at 05/21/2021 1303 Last data filed at 05/20/2021 1835 Gross per 24 hour  Intake 75.08 ml  Output --  Net 75.08 ml      Physical Exam: General: No acute distress lying in the bed  HEENT Moist oral mucous membranes  Pulm/lungs Bilateral pulmonary basilar crackles  CVS/Heart Irregular rhythm, A. fib  Abdomen:  Soft, nontender  Extremities: 1+ edema over the legs bilaterally  Neurologic: Alert, oriented  Skin: No acute rashes  Access: Right forearm AV fistula       Basic Metabolic Panel:  Recent Labs  Lab 05/18/21 1225 05/19/21 0725  NA  139 138  K 4.1 3.9  CL 95* 98  CO2 33* 30  GLUCOSE 84 77  BUN 45* 28*  CREATININE 6.44* 4.21*  CALCIUM 10.3 9.8  MG 2.2  --   PHOS 4.2  --       CBC: Recent Labs  Lab 05/18/21 1225 05/19/21 0725  WBC 6.3 5.2  NEUTROABS 4.1  --   HGB 9.6* 8.4*  HCT 29.2* 26.7*  MCV 100.0 101.9*  PLT 174 144*       Lab Results  Component Value Date   HEPBSAG NON REACTIVE 04/29/2021   HEPBSAB Reactive (A) 04/29/2021      Microbiology:  Recent Results (from the past 240 hour(s))  Resp Panel by RT-PCR (Flu A&B, Covid) Nasopharyngeal Swab     Status: None   Collection Time: 05/18/21  2:12 PM   Specimen: Nasopharyngeal Swab; Nasopharyngeal(NP) swabs in vial transport medium  Result Value Ref Range Status   SARS Coronavirus 2 by RT PCR NEGATIVE NEGATIVE Final    Comment: (NOTE) SARS-CoV-2 target nucleic acids are NOT DETECTED.  The SARS-CoV-2 RNA is generally detectable in upper respiratory specimens during the acute phase of infection. The lowest concentration of SARS-CoV-2 viral copies this assay can detect is 138 copies/mL. A negative result does not preclude SARS-Cov-2 infection and should not be used as the sole basis for treatment or other patient management decisions. A negative result may occur with  improper specimen collection/handling, submission of specimen  other than nasopharyngeal swab, presence of viral mutation(s) within the areas targeted by this assay, and inadequate number of viral copies(<138 copies/mL). A negative result must be combined with clinical observations, patient history, and epidemiological information. The expected result is Negative.  Fact Sheet for Patients:  EntrepreneurPulse.com.au  Fact Sheet for Healthcare Providers:  IncredibleEmployment.be  This test is no t yet approved or cleared by the [place] FDA and  has been authorized for detection and/or diagnosis of SARS-CoV-2 by FDA under an  Emergency Use Authorization (EUA). This EUA will remain  in effect (meaning this test can be used) for the duration of the COVID-19 declaration under Section 564(b)(1) of the Act, 21 U.S.C.section 360bbb-3(b)(1), unless the authorization is terminated  or revoked sooner.       Influenza A by PCR NEGATIVE NEGATIVE Final   Influenza B by PCR NEGATIVE NEGATIVE Final    Comment: (NOTE) The Xpert Xpress SARS-CoV-2/FLU/RSV plus assay is intended as an aid in the diagnosis of influenza from Nasopharyngeal swab specimens and should not be used as a sole basis for treatment. Nasal washings and aspirates are unacceptable for Xpert Xpress SARS-CoV-2/FLU/RSV testing.  Fact Sheet for Patients: EntrepreneurPulse.com.au  Fact Sheet for Healthcare Providers: IncredibleEmployment.be  This test is not yet approved or cleared by the [place] FDA and has been authorized for detection and/or diagnosis of SARS-CoV-2 by FDA under an Emergency Use Authorization (EUA). This EUA will remain in effect (meaning this test can be used) for the duration of the COVID-19 declaration under Section 564(b)(1) of the Act, 21 U.S.C. section 360bbb-3(b)(1), unless the authorization is terminated or revoked.  Performed at [facility], [address] [place], [place] [address]     Coagulation Studies: No results for input(s): LABPROT, INR in the last 72 hours.  Urinalysis: No results for input(s): COLORURINE, LABSPEC, PHURINE, GLUCOSEU, HGBUR, BILIRUBINUR, KETONESUR, PROTEINUR, UROBILINOGEN, NITRITE, LEUKOCYTESUR in the last 72 hours.  Invalid input(s): APPERANCEUR    Imaging: No results found.   Medications:     Current Facility-Administered Medications:    acetaminophen (TYLENOL) tablet 650 mg, 650 mg, Oral, Q6H PRN **OR** acetaminophen (TYLENOL) suppository 650 mg, 650 mg, Rectal, Q6H PRN, Cox, Amy N, DO   apixaban (ELIQUIS) tablet 2.5 mg, 2.5 mg,  Oral, BID, Cox, Amy N, DO, 2.5 mg at 05/21/21 0950   aspirin EC tablet 81 mg, 81 mg, Oral, Daily, Cox, Amy N, DO, 81 mg at 05/21/21 0950   atorvastatin (LIPITOR) tablet 80 mg, 80 mg, Oral, Daily, Cox, Amy N, DO, 80 mg at 05/21/21 0949   bisacodyl (DULCOLAX) suppository 10 mg, 10 mg, Rectal, Daily PRN, Cox, Amy N, DO   Chlorhexidine Gluconate Cloth 2 % PADS 6 each, 6 each, Topical, Q0600, Cox, Amy N, DO, 6 each at 05/21/21 0635   cinacalcet (SENSIPAR) tablet 60 mg, 60 mg, Oral, BID WC, Cox, Amy N, DO, 60 mg at 05/21/21 0951   docusate sodium (COLACE) capsule 100 mg, 100 mg, Oral, BID, Cox, Amy N, DO, 100 mg at 05/20/21 1316   epoetin alfa (EPOGEN) injection 4,000 Units, 4,000 Units, Intravenous, Q T,Th,Sa-HD, Cox, Amy N, DO, 4,000 Units at 05/20/21 1118   feeding supplement (NEPRO CARB STEADY) liquid 237 mL, 237 mL, Oral, BID BM, Val Riles, MD, 237 mL at 05/21/21 0959   fluticasone (FLONASE) 50 MCG/ACT nasal spray 1-2 spray, 1-2 spray, Each Nare, Q1200, Cox, Amy N, DO, 2 spray at 05/20/21 1328   hydroxychloroquine (PLAQUENIL) tablet 200 mg, 200 mg,  Oral, Daily, Cox, Amy N, DO, 200 mg at 05/21/21 0949   MEDLINE mouth rinse, 15 mL, Mouth Rinse, BID, Val Riles, MD, 15 mL at 05/20/21 2258   metoprolol tartrate (LOPRESSOR) tablet 12.5 mg, 12.5 mg, Oral, BID, Val Riles, MD, 12.5 mg at 05/21/21 0949   midodrine (PROAMATINE) tablet 10 mg, 10 mg, Oral, Q6H PRN, Sharion Settler, NP, 10 mg at 05/19/21 1102   multivitamin (RENA-VIT) tablet 1 tablet, 1 tablet, Oral, QHS, Val Riles, MD, 1 tablet at 05/20/21 2256   ondansetron (ZOFRAN) tablet 4 mg, 4 mg, Oral, Q6H PRN **OR** ondansetron (ZOFRAN) injection 4 mg, 4 mg, Intravenous, Q6H PRN, Cox, Amy N, DO   polyethylene glycol (MIRALAX / GLYCOLAX) packet 17 g, 17 g, Oral, BID, Cox, Amy N, DO, 17 g at 05/20/21 1316   senna (SENOKOT) tablet 17.2 mg, 2 tablet, Oral, QHS, Cox, Amy N, DO, 17.2 mg at 05/19/21 2020   sevelamer carbonate (RENVELA) tablet  1,600 mg, 1,600 mg, Oral, TID PRN, Dallie Piles, RPH   sevelamer carbonate (RENVELA) tablet 2,400 mg, 2,400 mg, Oral, TID with meals, Dallie Piles, RPH, 2,400 mg at 05/21/21 1221   torsemide (DEMADEX) tablet 100 mg, 100 mg, Oral, Q M,W,F, Verdella Laidlaw, Benancio Deeds, NP   Vitamin D (Ergocalciferol) (DRISDOL) capsule 50,000 Units, 50,000 Units, Oral, Q7 days, Val Riles, MD, 50,000 Units at 05/21/21 4132   Assessment/ Plan:  75 y.o. female with   hypertension, anemia, multiple myeloma, chronic respiratory failure on home O2, and end stage renal disease on dialysis  admitted on 05/18/2021 for Volume overload [E87.70] Acute on chronic respiratory failure with hypoxia ([place]) [J96.21]  [facility]/TTS/Rt lower forearm AVF  #Shortness of breath with volume overload/acute pulmonary edema in the setting of end-stage renal disease.  Patient has increasing oxygen requirement.  Chest x-ray suggest volume overload. -Successful dialysis yesterday with UF goal 2L achieved. A total of 7L, about 15 lbs has been removed.  IV Lasix 100 mg given for additional diuresis.  3 urine occurrences charted during evening and night hours.  We will prescribe oral torsemide 100 mg on nondialysis days to manage fluid accumulation.  Requesting pure wick for accurate output documentation. Next scheduled treatment will be Saturday with UF goal of 3 L. Lactulose given for constipation, two stool occurrences documented  #Anemia of chronic kidney disease  Lab Results  Component Value Date   HGB 8.4 (L) 05/19/2021   Hgb below target, continue EPO with treatments  #Secondary hyperparathyroidism Lab Results  Component Value Date   PTH 242 (H) 09/29/2020   CALCIUM 9.8 05/19/2021   PHOS 4.2 05/18/2021  Calcium and phosphorus at goal continue Cinacalcet as well as Renvela at home doses.   LOS: [place] 10/14/20221:03 PM  [place], [place]

## 2021-05-22 DIAGNOSIS — R778 Other specified abnormalities of plasma proteins: Secondary | ICD-10-CM | POA: Diagnosis not present

## 2021-05-22 DIAGNOSIS — R0602 Shortness of breath: Secondary | ICD-10-CM | POA: Diagnosis not present

## 2021-05-22 DIAGNOSIS — N186 End stage renal disease: Secondary | ICD-10-CM | POA: Diagnosis not present

## 2021-05-22 DIAGNOSIS — J9611 Chronic respiratory failure with hypoxia: Secondary | ICD-10-CM | POA: Diagnosis not present

## 2021-05-22 MED ORDER — EPOETIN ALFA 4000 UNIT/ML IJ SOLN
INTRAMUSCULAR | Status: AC
  Filled 2021-05-22: qty 1

## 2021-05-22 NOTE — Progress Notes (Signed)
[place], [place] 05/22/21  Subjective:   Hospital day # 3   Patient known to our practice from previous admissions.  This time presents to the emergency room for dyspnea on exertion.  She finished her dialysis on Saturday but has been short of breath over the weekend.  This morning she was sent from her dialysis center to the emergency room over concerns about her breathing status.  Evaluation in the ER shows increasing oxygen requirements from her baseline of 3 L up to 5 L today.  Chest x-ray shows bibasilar opacities increased on the right.  BNP is elevated at 1458.  Emergency room has consulted nephrology for evaluation of urgent hemodialysis.  Patient seen and evaluated during dialysis   HEMODIALYSIS FLOWSHEET:  Blood Flow Rate (mL/min): 400 mL/min Arterial Pressure (mmHg): -130 mmHg Venous Pressure (mmHg): 100 mmHg Transmembrane Pressure (mmHg): 90 mmHg Ultrafiltration Rate (mL/min): 1170 mL/min Dialysate Flow Rate (mL/min): 500 ml/min Conductivity: Machine : 13.9 Conductivity: Machine : 13.9 Dialysate Change: Other (comment) (3k 2.5 Ca)  No complaints at this time   Objective:  Vital signs in last 24 hours:  Temp:  [97.5 F (36.4 C)-98.3 F (36.8 C)] 98.3 F (36.8 C) (10/15 0800) Pulse Rate:  [55-74] 70 (10/15 1200) Resp:  [16-21] 21 (10/15 0945) BP: (97-136)/(49-72) 123/55 (10/15 1200) SpO2:  [94 %-100 %] 100 % (10/15 1200) Weight:  [108 kg] 108 kg (10/15 0930)  Weight change:  Filed Weights   05/20/21 0855 05/21/21 0635 05/22/21 0930  Weight: 111 kg 109.7 kg 108 kg    Intake/Output:    Intake/Output Summary (Last 24 hours) at 05/22/2021 1242 Last data filed at 05/21/2021 1921 Gross per 24 hour  Intake 240 ml  Output --  Net 240 ml      Physical Exam: General: No acute distress lying in the bed  HEENT Moist oral mucous membranes  Pulm/lungs Bilateral pulmonary basilar crackles  CVS/Heart Irregular rhythm, A. fib   Abdomen:  Soft, nontender  Extremities: Trace edema legs bilaterally  Neurologic: Alert, oriented  Skin: No acute rashes  Access: Right forearm AV fistula       Basic Metabolic Panel:  Recent Labs  Lab 05/18/21 1225 05/19/21 0725  NA 139 138  K 4.1 3.9  CL 95* 98  CO2 33* 30  GLUCOSE 84 77  BUN 45* 28*  CREATININE 6.44* 4.21*  CALCIUM 10.3 9.8  MG 2.2  --   PHOS 4.2  --       CBC: Recent Labs  Lab 05/18/21 1225 05/19/21 0725  WBC 6.3 5.2  NEUTROABS 4.1  --   HGB 9.6* 8.4*  HCT 29.2* 26.7*  MCV 100.0 101.9*  PLT 174 144*       Lab Results  Component Value Date   HEPBSAG NON REACTIVE 04/29/2021   HEPBSAB Reactive (A) 04/29/2021      Microbiology:  Recent Results (from the past 240 hour(s))  Resp Panel by RT-PCR (Flu A&B, Covid) Nasopharyngeal Swab     Status: None   Collection Time: 05/18/21  2:12 PM   Specimen: Nasopharyngeal Swab; Nasopharyngeal(NP) swabs in vial transport medium  Result Value Ref Range Status   SARS Coronavirus 2 by RT PCR NEGATIVE NEGATIVE Final    Comment: (NOTE) SARS-CoV-2 target nucleic acids are NOT DETECTED.  The SARS-CoV-2 RNA is generally detectable in upper respiratory specimens during the acute phase of infection. The lowest concentration of SARS-CoV-2 viral copies this assay can detect is 138 copies/mL. A  negative result does not preclude SARS-Cov-2 infection and should not be used as the sole basis for treatment or other patient management decisions. A negative result may occur with  improper specimen collection/handling, submission of specimen other than nasopharyngeal swab, presence of viral mutation(s) within the areas targeted by this assay, and inadequate number of viral copies(<138 copies/mL). A negative result must be combined with clinical observations, patient history, and epidemiological information. The expected result is Negative.  Fact Sheet for Patients:   EntrepreneurPulse.com.au  Fact Sheet for Healthcare Providers:  IncredibleEmployment.be  This test is no t yet approved or cleared by the [place] FDA and  has been authorized for detection and/or diagnosis of SARS-CoV-2 by FDA under an Emergency Use Authorization (EUA). This EUA will remain  in effect (meaning this test can be used) for the duration of the COVID-19 declaration under Section 564(b)(1) of the Act, 21 U.S.C.section 360bbb-3(b)(1), unless the authorization is terminated  or revoked sooner.       Influenza A by PCR NEGATIVE NEGATIVE Final   Influenza B by PCR NEGATIVE NEGATIVE Final    Comment: (NOTE) The Xpert Xpress SARS-CoV-2/FLU/RSV plus assay is intended as an aid in the diagnosis of influenza from Nasopharyngeal swab specimens and should not be used as a sole basis for treatment. Nasal washings and aspirates are unacceptable for Xpert Xpress SARS-CoV-2/FLU/RSV testing.  Fact Sheet for Patients: EntrepreneurPulse.com.au  Fact Sheet for Healthcare Providers: IncredibleEmployment.be  This test is not yet approved or cleared by the [place] FDA and has been authorized for detection and/or diagnosis of SARS-CoV-2 by FDA under an Emergency Use Authorization (EUA). This EUA will remain in effect (meaning this test can be used) for the duration of the COVID-19 declaration under Section 564(b)(1) of the Act, 21 U.S.C. section 360bbb-3(b)(1), unless the authorization is terminated or revoked.  Performed at [facility], [address] [place], [place] [address]     Coagulation Studies: No results for input(s): LABPROT, INR in the last 72 hours.  Urinalysis: No results for input(s): COLORURINE, LABSPEC, PHURINE, GLUCOSEU, HGBUR, BILIRUBINUR, KETONESUR, PROTEINUR, UROBILINOGEN, NITRITE, LEUKOCYTESUR in the last 72 hours.  Invalid input(s): APPERANCEUR    Imaging: No  results found.   Medications:     Current Facility-Administered Medications:    apixaban (ELIQUIS) tablet 2.5 mg, 2.5 mg, Oral, BID, Cox, Amy N, DO, 2.5 mg at 05/21/21 2112   aspirin EC tablet 81 mg, 81 mg, Oral, Daily, Cox, Amy N, DO, 81 mg at 05/21/21 0950   atorvastatin (LIPITOR) tablet 80 mg, 80 mg, Oral, Daily, Cox, Amy N, DO, 80 mg at 05/21/21 0949   bisacodyl (DULCOLAX) suppository 10 mg, 10 mg, Rectal, Daily PRN, Cox, Amy N, DO   cinacalcet (SENSIPAR) tablet 60 mg, 60 mg, Oral, BID WC, Cox, Amy N, DO, 60 mg at 05/21/21 1653   docusate sodium (COLACE) capsule 100 mg, 100 mg, Oral, BID, Cox, Amy N, DO, 100 mg at 05/20/21 1316   epoetin alfa (EPOGEN) 4000 UNIT/ML injection, , , ,    epoetin alfa (EPOGEN) injection 4,000 Units, 4,000 Units, Intravenous, Q T,Th,Sa-HD, Cox, Amy N, DO, 4,000 Units at 05/22/21 1201   feeding supplement (NEPRO CARB STEADY) liquid 237 mL, 237 mL, Oral, BID BM, Dwyane Dee, Dileep, MD, 237 mL at 05/21/21 1400   fluticasone (FLONASE) 50 MCG/ACT nasal spray 1-2 spray, 1-2 spray, Each Nare, Q1200, Cox, Amy N, DO, 2 spray at 05/20/21 1328   hydroxychloroquine (PLAQUENIL) tablet 200 mg, 200 mg, Oral, Daily,  Cox, Amy N, DO, 200 mg at 05/21/21 0949   MEDLINE mouth rinse, 15 mL, Mouth Rinse, BID, Val Riles, MD, 15 mL at 05/20/21 2258   metoprolol tartrate (LOPRESSOR) tablet 12.5 mg, 12.5 mg, Oral, BID, Val Riles, MD, 12.5 mg at 05/21/21 0949   midodrine (PROAMATINE) tablet 10 mg, 10 mg, Oral, Q6H PRN, Sharion Settler, NP, 10 mg at 05/19/21 1102   multivitamin (RENA-VIT) tablet 1 tablet, 1 tablet, Oral, QHS, Val Riles, MD, 1 tablet at 05/21/21 2113   ondansetron (ZOFRAN) tablet 4 mg, 4 mg, Oral, Q6H PRN **OR** ondansetron (ZOFRAN) injection 4 mg, 4 mg, Intravenous, Q6H PRN, Cox, Amy N, DO   polyethylene glycol (MIRALAX / GLYCOLAX) packet 17 g, 17 g, Oral, BID, Cox, Amy N, DO, 17 g at 05/20/21 1316   senna (SENOKOT) tablet 17.2 mg, 2 tablet, Oral, QHS, Cox, Amy N,  DO, 17.2 mg at 05/19/21 2020   sevelamer carbonate (RENVELA) tablet 1,600 mg, 1,600 mg, Oral, TID PRN, Dallie Piles, RPH   sevelamer carbonate (RENVELA) tablet 2,400 mg, 2,400 mg, Oral, TID with meals, Dallie Piles, RPH, 2,400 mg at 05/21/21 1653   torsemide (DEMADEX) tablet 100 mg, 100 mg, Oral, Q M,W,F, Romi Rathel, NP, 100 mg at 05/21/21 1653   Vitamin D (Ergocalciferol) (DRISDOL) capsule 50,000 Units, 50,000 Units, Oral, Q7 days, Val Riles, MD, 50,000 Units at 05/21/21 7793   Assessment/ Plan:  75 y.o. female with   hypertension, anemia, multiple myeloma, chronic respiratory failure on home O2, and end stage renal disease on dialysis  admitted on 05/18/2021 for Volume overload [E87.70] Acute on chronic respiratory failure with hypoxia (HCC) [J96.21]  [facility]/TTS/Rt lower forearm AVF  #Shortness of breath with volume overload/acute pulmonary edema in the setting of end-stage renal disease.  Patient has increasing oxygen requirement.  Chest x-ray suggest volume overload. A total of 7L, about 15 lbs has been removed.  Start oral torsemide 100 mg on nondialysis days yesterday.  4 urine occurrences charted during evening and overnight hours.  Requesting pure wick for accurate output documentation.  Receiving dialysis today with UF goal of 3 L as tolerated.  Next dialysis treatment scheduled for Tuesday  #Anemia of chronic kidney disease  Lab Results  Component Value Date   HGB 8.4 (L) 05/19/2021   Hgb below target, continue low-dose EPO with treatments  #Secondary hyperparathyroidism Lab Results  Component Value Date   PTH 242 (H) 09/29/2020   CALCIUM 9.8 05/19/2021   PHOS 4.2 05/18/2021  Calcium and phosphorus at goal, will obtain updated lab work in a.m. continue Cinacalcet as well as Renvela at home doses.   LOS: [place] 10/15/202212:42 [place], [place]

## 2021-05-22 NOTE — Discharge Summary (Signed)
Physician Discharge Summary  CLYDIA NIEVES IRW:[id number] DOB: [birth year] DOA: 05/18/2021  PCP: Tracie Harrier, MD  Admit date: 05/18/2021 Discharge date: 05/22/2021  Admitted From: Inpatient Disposition: home  Recommendations for Outpatient Follow-up:  Follow up with PCP in 1-2 weeks Please obtain BMP/CBC in one week   Home Health:Yes Equipment/Devices:no new equip  Discharge Condition:Stable CODE STATUS:Full code Diet recommendation: Renal diet  Brief/Interim Summary: Om admission, TINSLEY EVERMAN is a 75 y.o. female with medical history significant for hypertension, hyperlipidemia, morbid obesity, OSA on CPAP, end-stage renal disease on hemodialysis Tuesday Thursday Saturday, paroxysmal atrial fibrillation on Eliquis twice daily, who presents to the emergency department for chief concerns of shortness of breath.   Patient was at hemodialysis center.  She reported to dialysis nurse that she was short of breath.  Hemodialysis center sent patient to the emergency department for further evaluation and a dialysis session was not completed at the hemodialysis center.   Patient reports compliance with hemodialysis and her last session was on Saturday, 05/15/2021.  She reports that the full amount was removed and she received a full dialysis treatment at that time.   The shortness of breat started Saturday evening. She denies cough. The shortness is worse with exertion and laying flat. She denies fever. She endorses some pleghm that is clear. She denies chest pain, abdominal pain. She endorses consitpation and denies diarrhea and nausea. She denies blood in her stool and vomtiing. Her last bowel movement was Sunday.    She does not make urine.    ED Course: Discussed with emergency medicine provider, patient requiring hospitalization for chief concerns of shortness of breath presumed secondary to volume overload.  Vitals in the emergency department was remarkable for temperature 98,  respiration rate of 25, heart rate is 75, initial blood pressure 134/62, SPO2 of 92% on 4 L nasal cannula.  Labs in the emergency department was remarkable for sodium 139, potassium 4.1, chloride 95, bicarb 33, BUN of 45, serum creatinine of 6.44, EGFR 6.  BNP was elevated at 1458.  High sensitive troponin is 68.  WBC 6.3, hemoglobin 9.6, platelets 174.  Hospital course: # Shortness of breath-most likely volume overload secondary to ESRD - procalcitonin negative  - Negative COVID/influenza A/influenza B PCR - Continuous BiPAP prn  - Nephrology consulted, patient got hemodialysis on 10/11 and 10/12 dialysis, and 10/13. Patient's hemodialysis schedule is TTS -Patient now at baseline oxygen need     # End-stage renal disease on hemodialysis-treat as above - Patient is anuric -Patient to continue outpatient nephrology care and home meds   # Hypertension- -Patient continue her home medication regimen with further outpatient titration through her PCP   # Elevated troponin-presumed secondary to chronically hemodialysis dependent - I have no clinical suspicion for ACS at this time as EKG did not show evidence of ischemia and patient denies chest pain # OSA-BiPAP nightly ordered # Paroxysmal atrial fibrillation-apixaban 2.5 mg p.o. twice daily # Hyperlipidemia-atorvastatin 80 mg nightly # History of multiple myeloma-recommend follow-up with outpatient medical oncologist, Dr. Rogue Bussing # History of rheumatoid arthritis, Seropositive-patient takes hydroxychloroquine 200 mg p.o. daily   Body mass index is 40.35 kg/m.  Interventions:  Discharge Diagnoses:  Principal Problem:   Volume overload Active Problems:   ESRD on hemodialysis (HCC)   Essential hypertension   Multiple myeloma (HCC)   Elevated troponin   HLD (hyperlipidemia)   SOB (shortness of breath)   History of anemia due to chronic kidney disease   PAF (paroxysmal atrial  fibrillation) (HCC)   Chronic respiratory failure  with hypoxia (HCC)   OSA on CPAP   Obesity, Class III, BMI 40-49.9 (morbid obesity) ([place])    Discharge Instructions  Discharge Instructions     Call MD for:  difficulty breathing, headache or visual disturbances   Complete by: As directed    Call MD for:  extreme fatigue   Complete by: As directed    Call MD for:  persistant dizziness or light-headedness   Complete by: As directed    Call MD for:  persistant nausea and vomiting   Complete by: As directed    Call MD for:  severe uncontrolled pain   Complete by: As directed    Call MD for:  temperature >100.4   Complete by: As directed    Diet - low sodium heart healthy   Complete by: As directed    Increase activity slowly   Complete by: As directed    No wound care   Complete by: As directed       Allergies as of 05/22/2021       Reactions   Shellfish Allergy Shortness Of Breath   Shellfish Allergy Other (See Comments)   Unknown reaction        Medication List     TAKE these medications    amLODipine 10 MG tablet Commonly known as: NORVASC Take 10 mg by mouth daily.   apixaban 2.5 MG Tabs tablet Commonly known as: ELIQUIS Take 1 tablet (2.5 mg total) by mouth 2 (two) times daily.   Eliquis 2.5 MG Tabs tablet Generic drug: apixaban Take 2.5 mg by mouth 2 (two) times daily.   Aspirin Low Dose 81 MG EC tablet Generic drug: aspirin Take 81 mg by mouth daily.   atorvastatin 80 MG tablet Commonly known as: LIPITOR Take 1 tablet (80 mg total) by mouth daily.   atorvastatin 80 MG tablet Commonly known as: LIPITOR Take 80 mg by mouth daily.   b complex-vitamin c-folic acid 0.8 MG Tabs tablet Take 1 tablet by mouth daily.   b complex-vitamin c-folic acid 0.8 MG Tabs tablet Take 1 tablet by mouth daily.   bisacodyl 10 MG suppository Commonly known as: DULCOLAX Place 1 suppository (10 mg total) rectally daily as needed for moderate constipation.   carvedilol 25 MG tablet Commonly known as:  COREG Take 25 mg by mouth 2 (two) times daily.   cinacalcet 60 MG tablet Commonly known as: SENSIPAR Take 60 mg by mouth 2 (two) times daily.   docusate sodium 100 MG capsule Commonly known as: COLACE Take 100 mg by mouth 2 (two) times daily.   epoetin alfa 4000 UNIT/ML injection Commonly known as: EPOGEN Inject 1 mL (4,000 Units total) into the vein Every Tuesday,Thursday,and Saturday with dialysis.   feeding supplement (NEPRO CARB STEADY) Liqd Take 237 mLs by mouth 3 (three) times daily between meals.   fluticasone 50 MCG/ACT nasal spray Commonly known as: FLONASE Place 1-2 sprays into both nostrils daily at 12 noon.   gabapentin 300 MG capsule Commonly known as: NEURONTIN Take 300 mg by mouth 3 (three) times daily.   HYDROcodone-acetaminophen 5-325 MG tablet Commonly known as: NORCO/VICODIN Take 1 tablet by mouth 2 (two) times daily as needed for pain.   Hydrocortisone (Perianal) 1 % Crea Apply topically.   hydrocortisone cream 1 % Apply 1 application topically 2 (two) times daily.   hydroxychloroquine 200 MG tablet Commonly known as: PLAQUENIL Take 200 mg by mouth daily.   hydroxychloroquine 200  MG tablet Commonly known as: PLAQUENIL Take 200 mg by mouth daily.   midodrine 10 MG tablet Commonly known as: PROAMATINE Take 1 tablet (10 mg total) by mouth 3 (three) times daily with meals.   midodrine 10 MG tablet Commonly known as: PROAMATINE Take 10 mg by mouth 3 (three) times daily.   polyethylene glycol 17 g packet Commonly known as: MIRALAX / GLYCOLAX Take 17 g by mouth 2 (two) times daily.   senna 8.6 MG tablet Commonly known as: SENOKOT Take 2 tablets by mouth at bedtime.   sevelamer carbonate 800 MG tablet Commonly known as: RENVELA Take 1,600-2,400 mg by mouth 3 (three) times daily. Take 3 tabs with meals and 2 tabs with snacks        Allergies  Allergen Reactions   Shellfish Allergy Shortness Of Breath   Shellfish Allergy Other (See  Comments)    Unknown reaction    Consultations: neophrology   Procedures/Studies: DG Chest 2 View  Result Date: 04/27/2021 CLINICAL DATA:  Shortness of breath. EXAM: CHEST - 2 VIEW COMPARISON:  None. FINDINGS: Mild to moderate severity diffusely increased interstitial lung markings are seen. Mild to moderate severity atelectatic changes are noted within the bilateral lung bases and mid right lung. Elevation of the right hemidiaphragm is seen. There is no evidence of a pleural effusion or pneumothorax. Mild to moderate severity enlargement of the cardiac silhouette is seen with marked severity calcification of the aortic arch. Degenerative changes seen throughout the thoracic spine. IMPRESSION: 1. Mild to moderate severity interstitial edema with mild to moderate severity bilateral atelectasis. Electronically Signed   By: Virgina Norfolk M.D.   On: 04/27/2021 19:25   DG Chest 2 View  Result Date: 04/24/2021 CLINICAL DATA:  Shortness of breath and cough. EXAM: CHEST - 2 VIEW COMPARISON:  08/07/2020 and prior chest radiographs FINDINGS: Cardiomegaly and mild pulmonary vascular congestion noted. Bilateral LOWER lung opacities/atelectasis/scarring are slightly decreased from 08/07/2020. There may be a small RIGHT pleural effusion present. Elevated RIGHT hemidiaphragm again noted. No pneumothorax is identified. IMPRESSION: 1. Cardiomegaly with mild pulmonary vascular congestion. 2. Bilateral LOWER lung opacities/atelectasis/scarring, slightly decreased from 08/07/2020. Possible small RIGHT pleural effusion. Electronically Signed   By: Margarette Canada M.D.   On: 04/24/2021 18:56   CT Chest Wo Contrast  Result Date: 04/28/2021 CLINICAL DATA:  Respiratory illness.  Shortness of breath. EXAM: CT CHEST WITHOUT CONTRAST TECHNIQUE: Multidetector CT imaging of the chest was performed following the standard protocol without IV contrast. COMPARISON:  Chest CT dated 10/02/2020. FINDINGS: Evaluation of this exam is  limited in the absence of intravenous contrast. Cardiovascular: Mild cardiomegaly. No pericardial effusion. Advanced three-vessel coronary vascular calcification and calcification of the mitral annulus. There is moderate atherosclerotic calcification of the thoracic aorta. No aneurysmal dilatation. There is mild dilatation of main pulmonary trunk suggestive of pulmonary hypertension. Mediastinum/Nodes: No definite hilar adenopathy. Evaluation however is very limited in the absence of contrast and due to consolidative changes of the lungs. The esophagus is grossly unremarkable. No mediastinal fluid collection. Lungs/Pleura: Patchy areas of consolidation involving the right upper lobe, left lower lobe as well as patchy and streaky right lower lobe consolidation most consistent with pneumonia. Clinical correlation and follow-up to resolution recommended. No pleural effusion or pneumothorax. There is mucous secretion within the trachea and bilateral bronchi. The central airways however remain patent. Upper Abdomen: No acute findings. Similar appearance of the kidneys with parenchyma atrophy and hypodense lesions. 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 Degenerative  changes of the spine. No acute osseous pathology. IMPRESSION: 1. Multifocal pneumonia. Clinical correlation and follow-up to resolution recommended. 2. Aortic Atherosclerosis (ICD10-I70.0). Electronically Signed   By: Anner Crete M.D.   On: 04/28/2021 02:06   DG Chest Portable 1 View  Result Date: 05/18/2021 CLINICAL DATA:  Shortness of breath. Additional history provided: Patient reports shortness of breath for the last few days, patient transferred via EMS from dialysis, patient did not complete dialysis today,. EXAM: PORTABLE CHEST 1 VIEW COMPARISON:  Prior chest radiographs 04/24/2021 and earlier. PET-CT 12/09/2020. FINDINGS: Cardiomegaly, unchanged. Aortic atherosclerosis. Interstitial edema, new as  compared to the chest radiographs of 04/24/2021. Chronic elevation of the right hemidiaphragm. Bibasilar opacities, increased on the right. These opacities may reflect atelectasis, scarring and/or pneumonia. Suspected small right pleural effusion. No evidence of pneumothorax. No acute bony abnormality identified. IMPRESSION: Redemonstrated cardiomegaly. Interstitial edema, new from the prior chest radiographs of 04/24/2021. Bibasilar opacities, increased on the right. These opacities may reflect atelectasis, scarring and/or pneumonia. Suspected small right pleural effusion. Chronic elevation of the right hemidiaphragm. Aortic Atherosclerosis (ICD10-I70.0). Electronically Signed   By: Kellie Simmering D.O.   On: 05/18/2021 12:38   ECHOCARDIOGRAM COMPLETE  Result Date: 04/28/2021    ECHOCARDIOGRAM REPORT   Patient Name:   RUSSIA SCHEIDERER Date of Exam: 04/28/2021 Medical Rec #:  [id number]      Height:       66.0 in Accession #:    [id number]     Weight:       254.0 lb Date of Birth:  1945-11-21      BSA:          2.214 m Patient Age:    4 years       BP:           108/59 mmHg Patient Gender: F              HR:           69 bpm. Exam Location:  [facility] Procedure: 2D Echo, Cardiac Doppler and Color Doppler Indications:     CHF-acute diastolic J03.15  History:         Patient has no prior history of Echocardiogram examinations.                  CHF; Risk Factors:Hypertension.  Sonographer:     Sherrie Sport Referring Phys:  [id number] Athena Masse Diagnosing Phys: Kate Sable MD  Sonographer Comments: Suboptimal apical window. IMPRESSIONS  1. Left ventricular ejection fraction, by estimation, is 55 to 60%. The left ventricle has normal function. The left ventricle has no regional wall motion abnormalities. Left ventricular diastolic parameters are indeterminate.  2. Right ventricular systolic function is normal. The right ventricular size is not well visualized.  3. Left atrial size was severely dilated.  4. Right atrial  size was moderately dilated.  5. The mitral valve is degenerative. Mild mitral valve regurgitation. Mild mitral stenosis. The mean mitral valve gradient is 4.0 mmHg. Moderate to severe mitral annular calcification.  6. The aortic valve is calcified. Aortic valve regurgitation is not visualized. Mild to moderate aortic valve sclerosis/calcification is present, without any evidence of aortic stenosis.  7. Aortic dilatation noted. There is mild-moderate dilatation of the aortic root, measuring 44 mm.  8. The inferior vena cava is normal in size with <50% respiratory variability, suggesting right atrial pressure of 8 mmHg. FINDINGS  Left Ventricle: Left ventricular ejection fraction, by estimation, is 55 to 60%. The left ventricle  has normal function. The left ventricle has no regional wall motion abnormalities. The left ventricular internal cavity size was normal in size. There is  no left ventricular hypertrophy. Left ventricular diastolic parameters are indeterminate. Right Ventricle: The right ventricular size is not well visualized. No increase in right ventricular wall thickness. Right ventricular systolic function is normal. Left Atrium: Left atrial size was severely dilated. Right Atrium: Right atrial size was moderately dilated. Pericardium: There is no evidence of pericardial effusion. Mitral Valve: The mitral valve is degenerative in appearance. Moderate to severe mitral annular calcification. Mild mitral valve regurgitation. Mild mitral valve stenosis. MV peak gradient, 14.3 mmHg. The mean mitral valve gradient is 4.0 mmHg. Tricuspid Valve: The tricuspid valve is normal in structure. Tricuspid valve regurgitation is trivial. Aortic Valve: The aortic valve is calcified. Aortic valve regurgitation is not visualized. Mild to moderate aortic valve sclerosis/calcification is present, without any evidence of aortic stenosis. Aortic valve mean gradient measures 6.0 mmHg. Aortic valve peak gradient measures 11.7  mmHg. Aortic valve area, by VTI measures 1.95 cm. Pulmonic Valve: The pulmonic valve was not well visualized. Pulmonic valve regurgitation is trivial. Aorta: Aortic dilatation noted. There is mild-moderate dilatation of the aortic root, measuring 44 mm. Venous: The inferior vena cava is normal in size with less than 50% respiratory variability, suggesting right atrial pressure of 8 mmHg. IAS/Shunts: No atrial level shunt detected by color flow Doppler.  LEFT VENTRICLE PLAX 2D LVIDd:         3.70 cm  Diastology LVIDs:         2.50 cm  LV e' medial:   4.46 cm/s LV PW:         1.80 cm  LV E/e' medial: 34.5 LV IVS:        1.55 cm LVOT diam:     2.00 cm LV SV:         63 LV SV Index:   29 LVOT Area:     3.14 cm  RIGHT VENTRICLE RV Basal diam:  4.90 cm RV S prime:     13.40 cm/s TAPSE (M-mode): 4.7 cm LEFT ATRIUM              Index       RIGHT ATRIUM           Index LA diam:        6.60 cm  2.98 cm/m  RA Area:     25.00 cm LA Vol (A2C):   176.0 ml 79.51 ml/m RA Volume:   82.50 ml  37.27 ml/m LA Vol (A4C):   103.0 ml 46.53 ml/m LA Biplane Vol: 142.0 ml 64.15 ml/m  AORTIC VALVE                    PULMONIC VALVE AV Area (Vmax):    1.68 cm     PV Vmax:        0.89 m/s AV Area (Vmean):   1.83 cm     PV Peak grad:   3.1 mmHg AV Area (VTI):     1.95 cm     RVOT Peak grad: 5 mmHg AV Vmax:           171.00 cm/s AV Vmean:          112.333 cm/s AV VTI:            0.324 m AV Peak Grad:      11.7 mmHg AV Mean Grad:      6.0 mmHg  LVOT Vmax:         91.40 cm/s LVOT Vmean:        65.600 cm/s LVOT VTI:          0.201 m LVOT/AV VTI ratio: 0.62  AORTA Ao Root diam: 3.77 cm MITRAL VALVE                TRICUSPID VALVE MV Area (PHT): 2.45 cm     TR Peak grad:   13.2 mmHg MV Area VTI:   1.51 cm     TR Vmax:        182.00 cm/s MV Peak grad:  14.3 mmHg MV Mean grad:  4.0 mmHg     SHUNTS MV Vmax:       1.89 m/s     Systemic VTI:  0.20 m MV Vmean:      91.9 cm/s    Systemic Diam: 2.00 cm MV Decel Time: 310 msec MV E velocity: 154.00  cm/s Kate Sable MD Electronically signed by Kate Sable MD Signature Date/Time: 04/28/2021/4:04:18 PM    Final       Subjective: Patient reports she feels at her baseline   Discharge Exam: Vitals:   05/22/21 1145 05/22/21 1200  BP: 118/65 (!) 123/55  Pulse: 61 70  Resp:    Temp:    SpO2: 100% 100%   Vitals:   05/22/21 1115 05/22/21 1130 05/22/21 1145 05/22/21 1200  BP: (!) 107/53 (!) 111/51 118/65 (!) 123/55  Pulse: 67 62 61 70  Resp:      Temp:      TempSrc:      SpO2: 100% 99% 100% 100%  Weight:      Height:        General: Pt is alert, awake, not in acute distress Cardiovascular: RRR, S1/S2 +, no rubs, no gallops Respiratory: CTA bilaterally, no wheezing, no rhonchi Abdominal: Soft, NT, ND, bowel sounds + Extremities: no edema, no cyanosis    The results of significant diagnostics from this hospitalization (including imaging, microbiology, ancillary and laboratory) are listed below for reference.     Microbiology: Recent Results (from the past 240 hour(s))  Resp Panel by RT-PCR (Flu A&B, Covid) Nasopharyngeal Swab     Status: None   Collection Time: 05/18/21  2:12 PM   Specimen: Nasopharyngeal Swab; Nasopharyngeal(NP) swabs in vial transport medium  Result Value Ref Range Status   SARS Coronavirus 2 by RT PCR NEGATIVE NEGATIVE Final    Comment: (NOTE) SARS-CoV-2 target nucleic acids are NOT DETECTED.  The SARS-CoV-2 RNA is generally detectable in upper respiratory specimens during the acute phase of infection. The lowest concentration of SARS-CoV-2 viral copies this assay can detect is 138 copies/mL. A negative result does not preclude SARS-Cov-2 infection and should not be used as the sole basis for treatment or other patient management decisions. A negative result may occur with  improper specimen collection/handling, submission of specimen other than nasopharyngeal swab, presence of viral mutation(s) within the areas targeted by this assay,  and inadequate number of viral copies(<138 copies/mL). A negative result must be combined with clinical observations, patient history, and epidemiological information. The expected result is Negative.  Fact Sheet for Patients:  EntrepreneurPulse.com.au  Fact Sheet for Healthcare Providers:  IncredibleEmployment.be  This test is no t yet approved or cleared by the [place] FDA and  has been authorized for detection and/or diagnosis of SARS-CoV-2 by FDA under an Emergency Use Authorization (EUA). This EUA will remain  in effect (meaning this test  can be used) for the duration of the COVID-19 declaration under Section 564(b)(1) of the Act, 21 U.S.C.section 360bbb-3(b)(1), unless the authorization is terminated  or revoked sooner.       Influenza A by PCR NEGATIVE NEGATIVE Final   Influenza B by PCR NEGATIVE NEGATIVE Final    Comment: (NOTE) The Xpert Xpress SARS-CoV-2/FLU/RSV plus assay is intended as an aid in the diagnosis of influenza from Nasopharyngeal swab specimens and should not be used as a sole basis for treatment. Nasal washings and aspirates are unacceptable for Xpert Xpress SARS-CoV-2/FLU/RSV testing.  Fact Sheet for Patients: EntrepreneurPulse.com.au  Fact Sheet for Healthcare Providers: IncredibleEmployment.be  This test is not yet approved or cleared by the [place] FDA and has been authorized for detection and/or diagnosis of SARS-CoV-2 by FDA under an Emergency Use Authorization (EUA). This EUA will remain in effect (meaning this test can be used) for the duration of the COVID-19 declaration under Section 564(b)(1) of the Act, 21 U.S.C. section 360bbb-3(b)(1), unless the authorization is terminated or revoked.  Performed at [facility], [address] [place], [place] [address]      Labs: BNP (last 3 results) Recent Labs    04/24/21 1703 04/28/21 0125  05/18/21 1225  BNP 1,371.7* 1,496.5* 9,629.5*   Basic Metabolic Panel: Recent Labs  Lab 05/18/21 1225 05/19/21 0725  NA 139 138  K 4.1 3.9  CL 95* 98  CO2 33* 30  GLUCOSE 84 77  BUN 45* 28*  CREATININE 6.44* 4.21*  CALCIUM 10.3 9.8  MG 2.2  --   PHOS 4.2  --    Liver Function Tests: Recent Labs  Lab 05/18/21 1225  AST 12*  ALT 12  ALKPHOS 54  BILITOT 0.9  PROT 7.9  ALBUMIN 3.5   No results for input(s): LIPASE, AMYLASE in the last 168 hours. No results for input(s): AMMONIA in the last 168 hours. CBC: Recent Labs  Lab 05/18/21 1225 05/19/21 0725  WBC 6.3 5.2  NEUTROABS 4.1  --   HGB 9.6* 8.4*  HCT 29.2* 26.7*  MCV 100.0 101.9*  PLT 174 144*   Cardiac Enzymes: No results for input(s): CKTOTAL, CKMB, CKMBINDEX, TROPONINI in the last 168 hours. BNP: Invalid input(s): POCBNP CBG: No results for input(s): GLUCAP in the last 168 hours. D-Dimer No results for input(s): DDIMER in the last 72 hours. Hgb A1c No results for input(s): HGBA1C in the last 72 hours. Lipid Profile No results for input(s): CHOL, HDL, LDLCALC, TRIG, CHOLHDL, LDLDIRECT in the last 72 hours. Thyroid function studies No results for input(s): TSH, T4TOTAL, T3FREE, THYROIDAB in the last 72 hours.  Invalid input(s): FREET3 Anemia work up Recent Labs    05/20/21 0905  FOLATE 20.7  TIBC 214*  IRON 33   Urinalysis No results found for: COLORURINE, APPEARANCEUR, LABSPEC, [place], [place], [place], [place], [place], PROTEINUR, UROBILINOGEN, NITRITE, LEUKOCYTESUR Sepsis Labs Invalid input(s): PROCALCITONIN,  WBC,  LACTICIDVEN Microbiology Recent Results (from the past 240 hour(s))  Resp Panel by RT-PCR (Flu A&B, Covid) Nasopharyngeal Swab     Status: None   Collection Time: 05/18/21  2:12 PM   Specimen: Nasopharyngeal Swab; Nasopharyngeal(NP) swabs in vial transport medium  Result Value Ref Range Status   SARS Coronavirus 2 by RT PCR NEGATIVE NEGATIVE Final    Comment:  (NOTE) SARS-CoV-2 target nucleic acids are NOT DETECTED.  The SARS-CoV-2 RNA is generally detectable in upper respiratory specimens during the acute phase of infection. The lowest concentration of SARS-CoV-2 viral copies this assay  can detect is 138 copies/mL. A negative result does not preclude SARS-Cov-2 infection and should not be used as the sole basis for treatment or other patient management decisions. A negative result may occur with  improper specimen collection/handling, submission of specimen other than nasopharyngeal swab, presence of viral mutation(s) within the areas targeted by this assay, and inadequate number of viral copies(<138 copies/mL). A negative result must be combined with clinical observations, patient history, and epidemiological information. The expected result is Negative.  Fact Sheet for Patients:  EntrepreneurPulse.com.au  Fact Sheet for Healthcare Providers:  IncredibleEmployment.be  This test is no t yet approved or cleared by the [place] FDA and  has been authorized for detection and/or diagnosis of SARS-CoV-2 by FDA under an Emergency Use Authorization (EUA). This EUA will remain  in effect (meaning this test can be used) for the duration of the COVID-19 declaration under Section 564(b)(1) of the Act, 21 U.S.C.section 360bbb-3(b)(1), unless the authorization is terminated  or revoked sooner.       Influenza A by PCR NEGATIVE NEGATIVE Final   Influenza B by PCR NEGATIVE NEGATIVE Final    Comment: (NOTE) The Xpert Xpress SARS-CoV-2/FLU/RSV plus assay is intended as an aid in the diagnosis of influenza from Nasopharyngeal swab specimens and should not be used as a sole basis for treatment. Nasal washings and aspirates are unacceptable for Xpert Xpress SARS-CoV-2/FLU/RSV testing.  Fact Sheet for Patients: EntrepreneurPulse.com.au  Fact Sheet for Healthcare  Providers: IncredibleEmployment.be  This test is not yet approved or cleared by the [place] FDA and has been authorized for detection and/or diagnosis of SARS-CoV-2 by FDA under an Emergency Use Authorization (EUA). This EUA will remain in effect (meaning this test can be used) for the duration of the COVID-19 declaration under Section 564(b)(1) of the Act, 21 U.S.C. section 360bbb-3(b)(1), unless the authorization is terminated or revoked.  Performed at [facility], [address] [place], [place] [address]      Time coordinating discharge: Over 30 minutes  SIGNED:   Nicolette Bang, MD  [facility] 05/22/2021, 12:48 PM Pager   If 7PM-7AM, please contact night-coverage [email or web address] Password [email or web address]

## 2021-05-22 NOTE — TOC Transition Note (Addendum)
Transition of Care [facility]) - CM/SW Discharge Note   Patient Details  Name: Cassandra Joseph MRN: [id number] Date of Birth: 05-Jul-1946  [facility] CM/SW Contact:  Magnus Ivan, LCSW Phone Number: 05/22/2021, 12:26 PM   Clinical Narrative:    Patient to discharge home today. CSW notified Corene Cornea with [facility] - they will provide RN and PT. EMS paperwork completed. Will call for EMS transport when RN is ready.  2:36- Checked with RN who says she is ready for transport. [facility] called.   Final next level of care: [place] Barriers to Discharge: Barriers Resolved   Patient Goals and CMS Choice     Choice offered to / list presented to : NA  Discharge Placement                Patient to be transferred to facility by: EMS   Patient and family notified of of transfer: 05/22/21  Discharge Plan and Services     Post Acute Care Choice: Resumption of Svcs/PTA Provider                    HH Arranged: RN, PT [facility] Agency: [place] ([place]) Date [facility] Agency Contacted: 05/22/21   Representative spoke with at [place]: [place]) Interventions     Readmission Risk Interventions Readmission Risk Prevention Plan 05/21/2021 08/11/2020 06/25/2019  Transportation Screening Complete Complete Complete  PCP or Specialist Appt within 3-5 Days - Complete Complete  HRI or [place] - Complete Complete  Social Work Consult for [place] Planning/Counseling - Complete Complete  Palliative Care Screening - Not Applicable Not Applicable  Medication Review Press photographer) Complete Complete Complete  PCP or Specialist appointment within 3-5 days of discharge Complete - -  [place] or Home Care Consult Complete - -  SW Recovery Care/Counseling Consult Complete - -  Palliative Care Screening Not Applicable - -  Media Not Applicable - -  Some recent data might be  hidden

## 2021-08-20 ENCOUNTER — Encounter: Payer: Self-pay | Admitting: Internal Medicine

## 2021-08-20 NOTE — Telephone Encounter (Signed)
error 

## 2021-09-15 ENCOUNTER — Encounter: Payer: Self-pay | Admitting: Internal Medicine

## 2021-11-19 ENCOUNTER — Emergency Department: Payer: Medicare Other

## 2021-11-19 ENCOUNTER — Inpatient Hospital Stay
Admission: EM | Admit: 2021-11-19 | Discharge: 2021-11-23 | DRG: 312 | Disposition: A | Discharge status: Home Health | Payer: Medicare Other | Source: Ambulatory Visit | Attending: Family Medicine | Admitting: Family Medicine

## 2021-11-19 ENCOUNTER — Other Ambulatory Visit: Payer: Self-pay

## 2021-11-19 DIAGNOSIS — G4733 Obstructive sleep apnea (adult) (pediatric): Secondary | ICD-10-CM | POA: Diagnosis present

## 2021-11-19 DIAGNOSIS — I953 Hypotension of hemodialysis: Secondary | ICD-10-CM | POA: Diagnosis not present

## 2021-11-19 DIAGNOSIS — R079 Chest pain, unspecified: Principal | ICD-10-CM

## 2021-11-19 DIAGNOSIS — I2510 Atherosclerotic heart disease of native coronary artery without angina pectoris: Secondary | ICD-10-CM | POA: Diagnosis present

## 2021-11-19 DIAGNOSIS — Z9981 Dependence on supplemental oxygen: Secondary | ICD-10-CM

## 2021-11-19 DIAGNOSIS — N186 End stage renal disease: Secondary | ICD-10-CM

## 2021-11-19 DIAGNOSIS — Z7901 Long term (current) use of anticoagulants: Secondary | ICD-10-CM

## 2021-11-19 DIAGNOSIS — Z79899 Other long term (current) drug therapy: Secondary | ICD-10-CM

## 2021-11-19 DIAGNOSIS — C9000 Multiple myeloma not having achieved remission: Secondary | ICD-10-CM | POA: Diagnosis present

## 2021-11-19 DIAGNOSIS — I480 Paroxysmal atrial fibrillation: Secondary | ICD-10-CM | POA: Diagnosis present

## 2021-11-19 DIAGNOSIS — Z8249 Family history of ischemic heart disease and other diseases of the circulatory system: Secondary | ICD-10-CM

## 2021-11-19 DIAGNOSIS — Z89422 Acquired absence of other left toe(s): Secondary | ICD-10-CM

## 2021-11-19 DIAGNOSIS — J9611 Chronic respiratory failure with hypoxia: Secondary | ICD-10-CM | POA: Diagnosis present

## 2021-11-19 DIAGNOSIS — Z90722 Acquired absence of ovaries, bilateral: Secondary | ICD-10-CM

## 2021-11-19 DIAGNOSIS — E785 Hyperlipidemia, unspecified: Secondary | ICD-10-CM | POA: Diagnosis present

## 2021-11-19 DIAGNOSIS — Z90710 Acquired absence of both cervix and uterus: Secondary | ICD-10-CM

## 2021-11-19 DIAGNOSIS — I4891 Unspecified atrial fibrillation: Secondary | ICD-10-CM

## 2021-11-19 DIAGNOSIS — Z8673 Personal history of transient ischemic attack (TIA), and cerebral infarction without residual deficits: Secondary | ICD-10-CM

## 2021-11-19 DIAGNOSIS — R001 Bradycardia, unspecified: Secondary | ICD-10-CM

## 2021-11-19 DIAGNOSIS — Z6841 Body Mass Index (BMI) 40.0 and over, adult: Secondary | ICD-10-CM

## 2021-11-19 DIAGNOSIS — I509 Heart failure, unspecified: Secondary | ICD-10-CM | POA: Diagnosis present

## 2021-11-19 DIAGNOSIS — Z9989 Dependence on other enabling machines and devices: Secondary | ICD-10-CM

## 2021-11-19 DIAGNOSIS — Z992 Dependence on renal dialysis: Secondary | ICD-10-CM

## 2021-11-19 DIAGNOSIS — D631 Anemia in chronic kidney disease: Secondary | ICD-10-CM | POA: Diagnosis present

## 2021-11-19 DIAGNOSIS — M069 Rheumatoid arthritis, unspecified: Secondary | ICD-10-CM | POA: Diagnosis present

## 2021-11-19 DIAGNOSIS — N2581 Secondary hyperparathyroidism of renal origin: Secondary | ICD-10-CM | POA: Diagnosis present

## 2021-11-19 DIAGNOSIS — I9589 Other hypotension: Secondary | ICD-10-CM | POA: Diagnosis present

## 2021-11-19 DIAGNOSIS — I132 Hypertensive heart and chronic kidney disease with heart failure and with stage 5 chronic kidney disease, or end stage renal disease: Secondary | ICD-10-CM | POA: Diagnosis present

## 2021-11-19 DIAGNOSIS — R112 Nausea with vomiting, unspecified: Secondary | ICD-10-CM | POA: Diagnosis not present

## 2021-11-19 DIAGNOSIS — Z87891 Personal history of nicotine dependence: Secondary | ICD-10-CM

## 2021-11-19 DIAGNOSIS — Z20822 Contact with and (suspected) exposure to covid-19: Secondary | ICD-10-CM | POA: Diagnosis present

## 2021-11-19 DIAGNOSIS — Z7982 Long term (current) use of aspirin: Secondary | ICD-10-CM

## 2021-11-19 DIAGNOSIS — I959 Hypotension, unspecified: Secondary | ICD-10-CM

## 2021-11-19 DIAGNOSIS — Z808 Family history of malignant neoplasm of other organs or systems: Secondary | ICD-10-CM

## 2021-11-19 DIAGNOSIS — E860 Dehydration: Secondary | ICD-10-CM | POA: Diagnosis present

## 2021-11-19 DIAGNOSIS — M79604 Pain in right leg: Secondary | ICD-10-CM | POA: Diagnosis not present

## 2021-11-19 LAB — APTT: aPTT: 47 s — ABNORMAL HIGH (ref 24–36)

## 2021-11-19 LAB — BASIC METABOLIC PANEL
Anion gap: 9 (ref 5–15)
BUN: 41 mg/dL — ABNORMAL HIGH (ref 8–23)
CO2: 30 mmol/L (ref 22–32)
Calcium: 9 mg/dL (ref 8.9–10.3)
Chloride: 95 mmol/L — ABNORMAL LOW (ref 98–111)
Creatinine, Ser: 5.23 mg/dL — ABNORMAL HIGH (ref 0.44–1.00)
GFR, Estimated: 8 mL/min — ABNORMAL LOW (ref >60)
Glucose, Bld: 98 mg/dL (ref 70–99)
Potassium: 3.9 mmol/L (ref 3.5–5.1)
Sodium: 134 mmol/L — ABNORMAL LOW (ref 135–145)

## 2021-11-19 LAB — CBC
HCT: 35.8 % — ABNORMAL LOW (ref 36.0–46.0)
Hemoglobin: 11.4 g/dL — ABNORMAL LOW (ref 12.0–15.0)
MCH: 31.3 pg (ref 26.0–34.0)
MCHC: 31.8 g/dL (ref 30.0–36.0)
MCV: 98.4 fL (ref 80.0–100.0)
Platelets: 148 10*3/uL — ABNORMAL LOW (ref 150–400)
RBC: 3.64 MIL/uL — ABNORMAL LOW (ref 3.87–5.11)
RDW: 15.9 % — ABNORMAL HIGH (ref 11.5–15.5)
WBC: 4.2 10*3/uL (ref 4.0–10.5)
nRBC: 0 % (ref 0.0–0.2)

## 2021-11-19 LAB — HEPATIC FUNCTION PANEL
ALT: 22 U/L (ref 0–44)
AST: 22 U/L (ref 15–41)
Albumin: 3.9 g/dL (ref 3.5–5.0)
Alkaline Phosphatase: 92 U/L (ref 38–126)
Bilirubin, Direct: 0.1 mg/dL (ref 0.0–0.2)
Indirect Bilirubin: 0.6 mg/dL (ref 0.3–0.9)
Total Bilirubin: 0.7 mg/dL (ref 0.3–1.2)
Total Protein: 8.1 g/dL (ref 6.5–8.1)

## 2021-11-19 LAB — LACTIC ACID, PLASMA
Lactic Acid, Venous: 1.4 mmol/L (ref 0.5–1.9)
Lactic Acid, Venous: 1.4 mmol/L (ref 0.5–1.9)

## 2021-11-19 LAB — PROTIME-INR
INR: 1.2 (ref 0.8–1.2)
Prothrombin Time: 15.1 s (ref 11.4–15.2)

## 2021-11-19 LAB — RESP PANEL BY RT-PCR (FLU A&B, COVID) ARPGX2
Influenza A by PCR: NEGATIVE
Influenza B by PCR: NEGATIVE
SARS Coronavirus 2 by RT PCR: NEGATIVE

## 2021-11-19 LAB — TROPONIN I (HIGH SENSITIVITY)
Troponin I (High Sensitivity): 14 ng/L (ref <18)
Troponin I (High Sensitivity): 17 ng/L

## 2021-11-19 LAB — TSH: TSH: 1.056 u[IU]/mL (ref 0.350–4.500)

## 2021-11-19 MED ORDER — ASPIRIN 81 MG PO TBEC: 81.0000 mg | DELAYED_RELEASE_TABLET | Freq: Every day | ORAL | Status: DC

## 2021-11-19 MED ORDER — LACTATED RINGERS IV BOLUS
500.0000 mL | Freq: Once | INTRAVENOUS | Status: AC
  Administered 2021-11-19: 500 mL via INTRAVENOUS

## 2021-11-19 MED ORDER — SEVELAMER CARBONATE 800 MG PO TABS
2400.0000 mg | ORAL_TABLET | Freq: Three times a day (TID) | ORAL | Status: DC
  Administered 2021-11-20 – 2021-11-23 (×9): 2400 mg via ORAL
  Filled 2021-11-19 (×7): qty 3

## 2021-11-19 MED ORDER — ATROPINE SULFATE 1 MG/10ML IJ SOSY: 0.5000 mg | PREFILLED_SYRINGE | Freq: Once | INTRAMUSCULAR | Status: DC | PRN

## 2021-11-19 MED ORDER — SEVELAMER CARBONATE 800 MG PO TABS
1600.0000 mg | ORAL_TABLET | Freq: Two times a day (BID) | ORAL | Status: DC | PRN
  Filled 2021-11-19 (×3): qty 2

## 2021-11-19 MED ORDER — ACETAMINOPHEN 650 MG RE SUPP: 650.0000 mg | Freq: Four times a day (QID) | RECTAL | Status: DC | PRN

## 2021-11-19 MED ORDER — POLYETHYLENE GLYCOL 3350 17 G PO PACK
17.0000 g | PACK | Freq: Two times a day (BID) | ORAL | Status: DC
  Administered 2021-11-19 – 2021-11-20 (×2): 17 g via ORAL
  Filled 2021-11-19 (×5): qty 1

## 2021-11-19 MED ORDER — MIDODRINE HCL 5 MG PO TABS
10.0000 mg | ORAL_TABLET | Freq: Three times a day (TID) | ORAL | Status: DC
  Administered 2021-11-19 – 2021-11-23 (×12): 10 mg via ORAL
  Filled 2021-11-19 (×10): qty 2

## 2021-11-19 MED ORDER — HYDROXYCHLOROQUINE SULFATE 200 MG PO TABS
200.0000 mg | ORAL_TABLET | Freq: Every day | ORAL | Status: DC
  Administered 2021-11-21 – 2021-11-23 (×3): 200 mg via ORAL
  Filled 2021-11-19 (×4): qty 1

## 2021-11-19 MED ORDER — IOHEXOL 350 MG/ML SOLN
100.0000 mL | Freq: Once | INTRAVENOUS | Status: AC | PRN
  Administered 2021-11-19: 100 mL via INTRAVENOUS

## 2021-11-19 MED ORDER — MIDODRINE HCL 5 MG PO TABS
10.0000 mg | ORAL_TABLET | Freq: Once | ORAL | Status: AC
  Administered 2021-11-20: 10 mg via ORAL
  Filled 2021-11-19: qty 2

## 2021-11-19 MED ORDER — ALUM & MAG HYDROXIDE-SIMETH 200-200-20 MG/5ML PO SUSP
30.0000 mL | Freq: Once | ORAL | Status: AC
  Administered 2021-11-19: 30 mL via ORAL
  Filled 2021-11-19: qty 30

## 2021-11-19 MED ORDER — ONDANSETRON HCL 4 MG PO TABS: 4.0000 mg | ORAL_TABLET | Freq: Four times a day (QID) | ORAL | Status: DC | PRN

## 2021-11-19 MED ORDER — CINACALCET HCL 30 MG PO TABS
60.0000 mg | ORAL_TABLET | Freq: Two times a day (BID) | ORAL | Status: DC
  Administered 2021-11-20 – 2021-11-23 (×6): 60 mg via ORAL
  Filled 2021-11-19 (×8): qty 2

## 2021-11-19 MED ORDER — ACETAMINOPHEN 325 MG PO TABS
650.0000 mg | ORAL_TABLET | Freq: Four times a day (QID) | ORAL | Status: DC | PRN
  Administered 2021-11-20 – 2021-11-23 (×7): 650 mg via ORAL
  Filled 2021-11-19 (×8): qty 2

## 2021-11-19 MED ORDER — ONDANSETRON HCL 4 MG/2ML IJ SOLN
4.0000 mg | Freq: Four times a day (QID) | INTRAMUSCULAR | Status: DC | PRN
  Administered 2021-11-22: 4 mg via INTRAVENOUS
  Filled 2021-11-19: qty 2

## 2021-11-19 MED ORDER — APIXABAN 2.5 MG PO TABS
2.5000 mg | ORAL_TABLET | Freq: Two times a day (BID) | ORAL | Status: DC
  Administered 2021-11-19 – 2021-11-23 (×8): 2.5 mg via ORAL
  Filled 2021-11-19 (×8): qty 1

## 2021-11-19 NOTE — H&P (Signed)
?History and Physical  ? ? ?Patient: Cassandra Joseph XIP:[id number] DOB: [birth year] ?DOA: 11/19/2021 ?DOS: the patient was seen and examined on 11/19/2021 ?PCP: Tracie Harrier, MD  ?Patient coming from: Home ? ?Chief Complaint:  ?Chief Complaint  ?Patient presents with  ? Hypotension  ? ? ?HPI: [name] is a 76 y.o. female with medical history significant for ESRD on HD, class III obesity, OSA on CPAP, chronic respiratory failure on O2 at 3 L, paroxysmal A-fib on Eliquis, rheumatoid arthritis, multiple myeloma, chronic hypotension on midodrine who was sent from dialysis due to low blood pressure.  Dialysis was stopped after 2 hours as a result.  Patient complained of stabbing chest pain as well as decreased appetite.  She denied palpitations, lightheadedness, cough fever or chills.  She has baseline shortness of breath that has not worsened.  She received 400 mL NS bolus with EMS in route. ?ED course and data review: On arrival BP 77/42 with pulse 51 and O2 sat 93% on 3 L.  Afebrile.  Blood work showed normal WBC, hemoglobin 11.4, platelets 1 48,000.  Lactic acid 1.4.  BMP as expected for dialysis status.  Hepatic function panel normal.  TSH normal at 1.056.  Troponin 17.  COVID and flu negative.  EKG, personally viewed and interpreted showing sinus rhythm with slow ventricular response of 50.  CTA aorta study showed no acute aortic findings and no acute abnormality in the abdomen or pelvis. ?Patient was treated with 3 500 cc LR boluses and given a dose of midodrine with improvement in blood pressure to 104/30 by admission.  Pulse was 56 on admission.  Hospitalist consulted for admission.  ? ?Review of Systems: As mentioned in the history of present illness. All other systems reviewed and are negative. ?Past Medical History:  ?Diagnosis Date  ? Brenner tumor   ? 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 CHF (congestive heart failure) ([place])   ? ESRD (end stage renal disease) ([place])   ? a. on HD x 11 years; b. felt to be 2/2  HTN; c. HD TTS  ? History of nuclear stress test   ? a. 08/2015: probably normal myocardial perfusion study, no evidence for significant ischemia or scar was noted, during stress, global systolic function normal, EF 54%, RV borderline dilatation, coronary artery calcifications and marked mitral annular calcifications were noted, liver calcifications were noted on attenuation CT scan likely represent sequelae of previous granulomatous disease  ? HLD (hyperlipidemia)   ? Hypertension   ? Ischemic stroke ([place])   ? a. 03/2014  ? Metabolic syndrome   ? Morbid obesity ([place])   ? Multiple myeloma ([place])   ? a. in remission  ? Osteomyelitis ([place])   ? a. 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 Renal disorder   ? Rheumatoid arthritis ([place])   ? TIA (transient ischemic attack)   ? a. 2014  ? ?Past Surgical History:  ?Procedure Laterality Date  ? ABDOMINAL HYSTERECTOMY    ? APPENDECTOMY    ? eyelid surgery    ? HERNIA REPAIR    ? right knee surgery    ? ?Social History:  reports that she has quit smoking. Her smoking use included cigarettes. She has never used smokeless tobacco. She reports that she does not drink alcohol and does not use drugs. ? ?Allergies  ?Allergen Reactions  ? Shellfish Allergy Shortness Of Breath  ? Shellfish Allergy Other (See Comments)  ?  Unknown reaction  ? ? ?Family History  ?Problem  Relation Age of Onset  ? Seizures Mother   ? Hypertension Mother   ? Throat cancer Father   ? Breast cancer Neg Hx   ? ? ?Prior to Admission medications   ?Medication Sig Start Date End Date Taking? Authorizing Provider  ?amLODipine (NORVASC) 10 MG tablet Take 10 mg by mouth daily. 02/03/21   [provider]  ?apixaban (ELIQUIS) 2.5 MG TABS tablet Take 1 tablet (2.5 mg total) by mouth 2 (two) times daily. 06/28/19   Lavina Hamman, MD  ?ASPIRIN LOW DOSE 81 MG EC tablet Take 81 mg by mouth daily. 12/26/20   [provider]  ?atorvastatin (LIPITOR) 80 MG tablet Take 1 tablet (80 mg total) by mouth daily. 06/29/19    Lavina Hamman, MD  ?atorvastatin (LIPITOR) 80 MG tablet Take 80 mg by mouth daily. 03/28/21   [provider]  ?b complex-vitamin c-folic acid (NEPHRO-VITE) 0.8 MG TABS tablet Take 1 tablet by mouth daily. 09/27/16   [provider]  ?b complex-vitamin c-folic acid (NEPHRO-VITE) 0.8 MG TABS tablet Take 1 tablet by mouth daily. 02/28/21   [provider]  ?bisacodyl (DULCOLAX) 10 MG suppository Place 1 suppository (10 mg total) rectally daily as needed for moderate constipation. 10/05/20   Cherene Altes, MD  ?carvedilol (COREG) 25 MG tablet Take 25 mg by mouth 2 (two) times daily. 02/03/21   [provider]  ?cinacalcet (SENSIPAR) 60 MG tablet Take 60 mg by mouth 2 (two) times daily.    [provider]  ?docusate sodium (COLACE) 100 MG capsule Take 100 mg by mouth 2 (two) times daily. 03/08/21   [provider]  ?ELIQUIS 2.5 MG TABS tablet Take 2.5 mg by mouth 2 (two) times daily. 04/04/21   [provider]  ?epoetin alfa (EPOGEN) 4000 UNIT/ML injection Inject 1 mL (4,000 Units total) into the vein Every Tuesday,Thursday,and Saturday with dialysis. 10/05/20   Cherene Altes, MD  ?fluticasone (FLONASE) 50 MCG/ACT nasal spray Place 1-2 sprays into both nostrils daily at 12 noon. 12/26/20   [provider]  ?gabapentin (NEURONTIN) 300 MG capsule Take 300 mg by mouth 3 (three) times daily. 07/27/20   [provider]  ?HYDROcodone-acetaminophen (NORCO/VICODIN) 5-325 MG tablet Take 1 tablet by mouth 2 (two) times daily as needed for pain. 02/08/21   [provider]  ?hydrocortisone cream 1 % Apply 1 application topically 2 (two) times daily. 11/24/20   Blake Divine, MD  ?Hydrocortisone, Perianal, 1 % CREA Apply topically. 11/24/20   [provider]  ?hydroxychloroquine (PLAQUENIL) 200 MG tablet Take 200 mg by mouth daily. 06/30/20   [provider]  ?hydroxychloroquine (PLAQUENIL) 200 MG tablet Take 200 mg by mouth  daily. 04/14/21   [provider]  ?midodrine (PROAMATINE) 10 MG tablet Take 1 tablet (10 mg total) by mouth 3 (three) times daily with meals. 06/28/19   Lavina Hamman, MD  ?midodrine (PROAMATINE) 10 MG tablet Take 10 mg by mouth 3 (three) times daily. 04/22/21   [provider]  ?Nutritional Supplements (FEEDING SUPPLEMENT, NEPRO CARB STEADY,) LIQD Take 237 mLs by mouth 3 (three) times daily between meals. 05/01/21   Ezekiel Slocumb, DO  ?polyethylene glycol (MIRALAX / GLYCOLAX) 17 g packet Take 17 g by mouth 2 (two) times daily. 10/05/20   Cherene Altes, MD  ?senna (SENOKOT) 8.6 MG tablet Take 2 tablets by mouth at bedtime. 11/27/20   [provider]  ?sevelamer carbonate (RENVELA) 800 MG tablet Take 1,600-2,400  mg by mouth 3 (three) times daily. Take 3 tabs with meals and 2 tabs with snacks 03/16/21   [provider]  ? ? ?Physical Exam: ?Vitals:  ? 11/19/21 1709 11/19/21 1825 11/19/21 1900 11/19/21 1915  ?BP: (!) 87/66 (!) 99/56 (!) 100/58 (!) 104/30  ?Pulse: (!) 49 (!) 57 (!) 52 (!) 56  ?Resp: _0 ?Temp:      ?SpO2: 98% 99% 100% 99%  ?Weight:      ?Height:      ? ?Physical Exam ?Vitals and nursing note reviewed.  ?Constitutional:   ?   General: She is not in acute distress. ?   Appearance: She is ill-appearing.  ?HENT:  ?   Head: Normocephalic and atraumatic.  ?Cardiovascular:  ?   Rate and Rhythm: Normal rate and regular rhythm.  ?   Pulses: Normal pulses.  ?   Heart sounds: Normal heart sounds.  ?Pulmonary:  ?   Effort: Pulmonary effort is normal.  ?   Breath sounds: Normal breath sounds.  ?Abdominal:  ?   Palpations: Abdomen is soft.  ?   Tenderness: There is no abdominal tenderness.  ?Neurological:  ?   Mental Status: Mental status is at baseline.  ? ? ? ?Data Reviewed: ?Relevant notes from primary care and specialist visits, past discharge summaries as available in EHR, including Care Everywhere. ?Prior diagnostic testing as pertinent to current admission  diagnoses ?Updated medications and problem lists for reconciliation ?ED course, including vitals, labs, imaging, treatment and response to treatment ?Triage notes, nursing and pharmacy notes and ED provid

## 2021-11-19 NOTE — ED Provider Notes (Signed)
? ?[facility] ?Provider Note ? ? ? Event Date/Time  ? First MD Initiated Contact with Patient 11/19/21 1602   ?  (approximate) ? ? ?History  ? ?Hypotension ? ? ?HPI ? ?Cassandra Joseph is a 76 y.o. female  with medical history significant for hypertension, hyperlipidemia, morbid obesity, arthritis, OSA on CPAP, end-stage renal disease on hemodialysis Tuesday Thursday Saturday, paroxysmal atrial fibrillation on Eliquis who presents to the emergency room for evaluation of low blood pressure.  Patient states he has not missed any dialysis but it was stopped 20 minutes earlier today because "they could not get my blood pressure up".  He states she has had a little chest pain on and off for the last 3 days slightly worse today but no other symptoms including dizziness, cough, change in chronic dyspnea, abdominal pain, vomiting, diarrhea, constipation rash or any falls or injuries.  She has been taking her Eliquis.  No focal extremity weakness numbness or tingling.  She states she took her morning midodrine but not have her afternoon dose.  She has also had less of an appetite of last couple days.  She states she thinks her arthritis is acting up a little bit her right hip. ? ?  ? ? ?Physical Exam  ?Triage Vital Signs: ?ED Triage Vitals  ?Enc Vitals Group  ?   BP 11/19/21 1558 (!) 77/42  ?   Pulse Rate 11/19/21 1558 (!) 51  ?   Resp 11/19/21 1555 20  ?   Temp 11/19/21 1555 97.8 ?F (36.6 ?C)  ?   Temp src --   ?   SpO2 11/19/21 1558 93 %  ?   Weight 11/19/21 1555 265 lb (120.2 kg)  ?   Height 11/19/21 1555 '5\' 6"'$  (1.676 m)  ?   Head Circumference --   ?   Peak Flow --   ?   Pain Score 11/19/21 1555 3  ?   Pain Loc --   ?   Pain Edu? --   ?   Excl. in [place]? --   ? ? ?Most recent vital signs: ?Vitals:  ? 11/19/21 1900 11/19/21 1915  ?BP: (!) 100/58 (!) 104/30  ?Pulse: (!) 52 (!) 56  ?Resp: 17 15  ?Temp:    ?SpO2: 100% 99%  ? ? ?General: Awake, no distress.  ?CV:  Audible systolic murmur.  Bradycardic.  2+  radial pulses. ?Resp:  Normal effort.  Clear bilaterally. ?Abd:  No distention.  Soft. ?Other:  She has some pain on passive range of motion of the right hip but skin is otherwise unremarkable she states has been bothering her for last couple days. ? ? ?ED Results / Procedures / Treatments  ?Labs ?(all labs ordered are listed, but only abnormal results are displayed) ?Labs Reviewed  ?CBC - Abnormal; Notable for the following components:  ?    Result Value  ? RBC 3.64 (*)   ? Hemoglobin 11.4 (*)   ? HCT 35.8 (*)   ? RDW 15.9 (*)   ? Platelets 148 (*)   ? All other components within normal limits  ?BASIC METABOLIC PANEL - Abnormal; Notable for the following components:  ? Sodium 134 (*)   ? Chloride 95 (*)   ? BUN 41 (*)   ? Creatinine, Ser 5.23 (*)   ? GFR, Estimated 8 (*)   ? All other components within normal limits  ?APTT - Abnormal; Notable for the following components:  ? aPTT 47 (*)   ?  All other components within normal limits  ?RESP PANEL BY RT-PCR (FLU A&B, COVID) [id number]  ?CULTURE, BLOOD (ROUTINE X 2)  ?CULTURE, BLOOD (ROUTINE X 2)  ?LACTIC ACID, PLASMA  ?HEPATIC FUNCTION PANEL  ?TSH  ?PROTIME-INR  ?LACTIC ACID, PLASMA  ?URINALYSIS, COMPLETE (UACMP) WITH MICROSCOPIC  ?CORTISOL  ?TROPONIN I (HIGH SENSITIVITY)  ?TROPONIN I (HIGH SENSITIVITY)  ? ? ? ?EKG ? ?ECG is remarkable for A-fib with a rate of 54, normal axis, otherwise unremarkable intervals without clear evidence of acute ischemia. ? ? ?RADIOLOGY ? ?Chest x-ray my interpretation shows some elevation of the right hemidiaphragm without overt edema, pneumothorax or clear focal consolidation or other clear acute process.  I also reviewed radiologist interpretation and agree to findings of resolution of previously noted interstitial pulmonary edema. ? ?CTA dissection protocol chest abdomen pelvis on my interpretation without evidence of dissection, large effusion, large PE, pneumonia, significant edema or other clear acute abdominal process.  I also reviewed  radiologist rotation and agree to findings of no acute process but there is elevation of the right hemidiaphragm associate with some impressive atelectasis at the right lung base as well as evidence of diverticulosis without diverticulitis and a small hiatal hernia.  There is also a left thyroid nodule and aortic atherosclerosis. ? ? ?PROCEDURES: ? ?Critical Care performed: Yes, see critical care procedure note(s) ? ?.Critical Care ?Performed by: Lucrezia Starch, MD ?Authorized by: Lucrezia Starch, MD  ? ?Critical care provider statement:  ?  Critical care time (minutes):  30 ?  Critical care was necessary to treat or prevent imminent or life-threatening deterioration of the following conditions:  Dehydration ?  Critical care was time spent personally by me on the following activities:  Development of treatment plan with patient or surrogate, discussions with consultants, evaluation of patient's response to treatment, examination of patient, ordering and review of laboratory studies, ordering and review of radiographic studies, ordering and performing treatments and interventions, pulse oximetry, re-evaluation of patient's condition and review of old charts ?.1-3 Lead EKG Interpretation ?Performed by: Lucrezia Starch, MD ?Authorized by: Lucrezia Starch, MD  ? ?  Interpretation: non-specific   ?  ECG rate assessment: bradycardic   ?  Rhythm: atrial fibrillation   ? ?The patient is on the cardiac monitor to evaluate for evidence of arrhythmia and/or significant heart rate changes. ? ? ?MEDICATIONS ORDERED IN ED: ?Medications  ?midodrine (PROAMATINE) tablet 10 mg (10 mg Oral Given 11/19/21 1652)  ?atropine 1 MG/10ML injection 0.5 mg (has no administration in time range)  ?alum & mag hydroxide-simeth (MAALOX/MYLANTA) 200-200-20 MG/5ML suspension 30 mL (has no administration in time range)  ?lactated ringers bolus 500 mL (has no administration in time range)  ?lactated ringers bolus 500 mL (0 mLs Intravenous Stopped  11/19/21 1827)  ?iohexol (OMNIPAQUE) 350 MG/ML injection 100 mL (100 mLs Intravenous Contrast Given 11/19/21 1735)  ?lactated ringers bolus 500 mL (500 mLs Intravenous New Bag/Given 11/19/21 1842)  ? ? ? ?IMPRESSION / MDM / ASSESSMENT AND PLAN / ED COURSE  ?I reviewed the triage vital signs and the nursing notes. ?             ?               ? ?Differential diagnosis includes, but is not limited to hypotension related to excessive volume removal during dialysis, poor p.o. intake over last couple days as reported by patient, anemia, symptomatic bradycardia, ACS given she reports of chest pain as well, pneumonia, pneumothorax,  symptomatic effusion and metabolic derangements.  She states she is compliant with her Eliquis and have a lower suspicion for PE.  She denies any recent trauma. ? ?ECG is remarkable for A-fib with a rate of 54, normal axis, otherwise unremarkable intervals without clear evidence of acute ischemia.  Given nonelevated troponin x2 Evalose suspicion for ACS ? ?Chest x-ray my interpretation shows some elevation of the right hemidiaphragm without overt edema, pneumothorax or clear focal consolidation or other clear acute process.  I also reviewed radiologist interpretation and agree to findings of resolution of previously noted interstitial pulmonary edema. ? ?CTA dissection protocol chest abdomen pelvis on my interpretation without evidence of dissection, large effusion, large PE, pneumonia, significant edema or other clear acute abdominal process.  I also reviewed radiologist rotation and agree to findings of no acute process but there is elevation of the right hemidiaphragm associate with some impressive atelectasis at the right lung base as well as evidence of diverticulosis without diverticulitis and a small hiatal hernia.  There is also a left thyroid nodule and aortic atherosclerosis. ? ?CBC without leukocytosis, hemoglobin 11.4 compared to 8.46 months ago.  Platelets are 140.  Lactic acid is  1.4.  BMP shows BUN and creatinine consistent with history of ESRD without any other significant acute electrolyte or metabolic derangements.  COVID influenza PCR is negative.  Hepatic function panel is

## 2021-11-19 NOTE — Assessment & Plan Note (Signed)
Continue CPAP nightly. °

## 2021-11-19 NOTE — ED Notes (Signed)
Patient transported to X-ray 

## 2021-11-19 NOTE — Progress Notes (Addendum)
Notified Sharion Settler, NP of hypotension. According to provider, PT. Runs low at baseline, but provider to order additional dose of midodrine at this time.  ? 11/19/21 2324  ?Vitals  ?Temp 98.3 ?F (36.8 ?C)  ?Temp Source Axillary  ?BP (!) 82/40  ?MAP (mmHg) (!) 53  ?BP Location Left Arm  ?BP Method Automatic  ?Patient Position (if appropriate) Lying  ?ECG Heart Rate (!) 55  ?Resp 19  ?Level of Consciousness  ?Level of Consciousness Alert  ?MEWS COLOR  ?MEWS Score Color Green  ?Oxygen Therapy  ?SpO2 100 %  ?O2 Device Nasal Cannula  ?O2 Flow Rate (L/min) 3 L/min  ?MEWS Score  ?MEWS Temp 0  ?MEWS Systolic 1  ?MEWS Pulse 0  ?MEWS RR 0  ?MEWS LOC 0  ?MEWS Score 1  ? ? ?

## 2021-11-19 NOTE — ED Triage Notes (Signed)
Pt to ED [facility] from dialysis for hypotension. Received 2 hours dialysis, dialysis was stopped and started d/t low bp.  ?Received 400 NS with EMS ?Wears 3 L  chronic ?Pt states she is having some chest pain and shob. States shob is chronic.  ?Has hx HTN, took meds today  ?

## 2021-11-19 NOTE — Assessment & Plan Note (Signed)
Hold carvedilol ?Continue Eliquis ?

## 2021-11-19 NOTE — Assessment & Plan Note (Addendum)
Suspect secondary to dehydration from dialysis and poor oral intake. ?No signs of infection or other acute illness to explain hypotension at this time. ?Continue midodrine, Continue IV fluid bolus. ?So far patient has received 2 L of IV fluids.  Feels better and been eating. ?Blood pressure has improved. ?

## 2021-11-19 NOTE — Assessment & Plan Note (Signed)
Last seen by Dr. Rogue Bussing in May 2022. ?

## 2021-11-19 NOTE — Assessment & Plan Note (Signed)
-   Continue Eliquis 

## 2021-11-19 NOTE — Assessment & Plan Note (Addendum)
Nephrology consulted for continuation of hemodialysis. ?Patient had completed dialysis x 3. Next HD as per schedule Monday Wednesday Friday ?Her blood pressure has improved. ?

## 2021-11-19 NOTE — Assessment & Plan Note (Signed)
Patient is at baseline O2 requirement ?

## 2021-11-19 NOTE — Assessment & Plan Note (Signed)
Continue hydroxychloroquine.  Continue hydrocodone as needed ?

## 2021-11-19 NOTE — Assessment & Plan Note (Signed)
Complicating factor to overall prognosis and care 

## 2021-11-20 ENCOUNTER — Encounter: Payer: Self-pay | Admitting: Internal Medicine

## 2021-11-20 DIAGNOSIS — R001 Bradycardia, unspecified: Secondary | ICD-10-CM | POA: Diagnosis present

## 2021-11-20 DIAGNOSIS — I4891 Unspecified atrial fibrillation: Secondary | ICD-10-CM | POA: Diagnosis not present

## 2021-11-20 DIAGNOSIS — R112 Nausea with vomiting, unspecified: Secondary | ICD-10-CM | POA: Diagnosis not present

## 2021-11-20 DIAGNOSIS — M069 Rheumatoid arthritis, unspecified: Secondary | ICD-10-CM | POA: Diagnosis present

## 2021-11-20 DIAGNOSIS — J9611 Chronic respiratory failure with hypoxia: Secondary | ICD-10-CM | POA: Diagnosis present

## 2021-11-20 DIAGNOSIS — Z7901 Long term (current) use of anticoagulants: Secondary | ICD-10-CM | POA: Diagnosis not present

## 2021-11-20 DIAGNOSIS — Z8249 Family history of ischemic heart disease and other diseases of the circulatory system: Secondary | ICD-10-CM | POA: Diagnosis not present

## 2021-11-20 DIAGNOSIS — G4733 Obstructive sleep apnea (adult) (pediatric): Secondary | ICD-10-CM | POA: Diagnosis present

## 2021-11-20 DIAGNOSIS — E785 Hyperlipidemia, unspecified: Secondary | ICD-10-CM | POA: Diagnosis present

## 2021-11-20 DIAGNOSIS — E860 Dehydration: Secondary | ICD-10-CM | POA: Diagnosis present

## 2021-11-20 DIAGNOSIS — Z20822 Contact with and (suspected) exposure to covid-19: Secondary | ICD-10-CM | POA: Diagnosis present

## 2021-11-20 DIAGNOSIS — M79604 Pain in right leg: Secondary | ICD-10-CM | POA: Diagnosis not present

## 2021-11-20 DIAGNOSIS — I132 Hypertensive heart and chronic kidney disease with heart failure and with stage 5 chronic kidney disease, or end stage renal disease: Secondary | ICD-10-CM | POA: Diagnosis present

## 2021-11-20 DIAGNOSIS — N2581 Secondary hyperparathyroidism of renal origin: Secondary | ICD-10-CM | POA: Diagnosis present

## 2021-11-20 DIAGNOSIS — Z6841 Body Mass Index (BMI) 40.0 and over, adult: Secondary | ICD-10-CM | POA: Diagnosis not present

## 2021-11-20 DIAGNOSIS — I9589 Other hypotension: Secondary | ICD-10-CM | POA: Diagnosis present

## 2021-11-20 DIAGNOSIS — I2510 Atherosclerotic heart disease of native coronary artery without angina pectoris: Secondary | ICD-10-CM | POA: Diagnosis present

## 2021-11-20 DIAGNOSIS — N186 End stage renal disease: Secondary | ICD-10-CM | POA: Diagnosis present

## 2021-11-20 DIAGNOSIS — I480 Paroxysmal atrial fibrillation: Secondary | ICD-10-CM | POA: Diagnosis present

## 2021-11-20 DIAGNOSIS — Z992 Dependence on renal dialysis: Secondary | ICD-10-CM | POA: Diagnosis not present

## 2021-11-20 DIAGNOSIS — Z808 Family history of malignant neoplasm of other organs or systems: Secondary | ICD-10-CM | POA: Diagnosis not present

## 2021-11-20 DIAGNOSIS — D631 Anemia in chronic kidney disease: Secondary | ICD-10-CM | POA: Diagnosis present

## 2021-11-20 DIAGNOSIS — I953 Hypotension of hemodialysis: Secondary | ICD-10-CM | POA: Diagnosis present

## 2021-11-20 DIAGNOSIS — C9000 Multiple myeloma not having achieved remission: Secondary | ICD-10-CM | POA: Diagnosis present

## 2021-11-20 LAB — CBC
HCT: 32.6 % — ABNORMAL LOW (ref 36.0–46.0)
Hemoglobin: 10.6 g/dL — ABNORMAL LOW (ref 12.0–15.0)
MCH: 31.5 pg (ref 26.0–34.0)
MCHC: 32.5 g/dL (ref 30.0–36.0)
MCV: 96.7 fL (ref 80.0–100.0)
Platelets: 143 10*3/uL — ABNORMAL LOW (ref 150–400)
RBC: 3.37 MIL/uL — ABNORMAL LOW (ref 3.87–5.11)
RDW: 15.9 % — ABNORMAL HIGH (ref 11.5–15.5)
WBC: 3.9 10*3/uL — ABNORMAL LOW (ref 4.0–10.5)
nRBC: 0 % (ref 0.0–0.2)

## 2021-11-20 LAB — TROPONIN I (HIGH SENSITIVITY): Troponin I (High Sensitivity): 18 ng/L — ABNORMAL HIGH (ref <18)

## 2021-11-20 LAB — CORTISOL: Cortisol, Plasma: 6.2 ug/dL

## 2021-11-20 MED ORDER — ALUM & MAG HYDROXIDE-SIMETH 200-200-20 MG/5ML PO SUSP
30.0000 mL | ORAL | Status: DC | PRN
  Administered 2021-11-20: 30 mL via ORAL
  Filled 2021-11-20: qty 30

## 2021-11-20 MED ORDER — HEPARIN SODIUM (PORCINE) 1000 UNIT/ML DIALYSIS: 20.0000 [IU]/kg | INTRAMUSCULAR | Status: DC | PRN

## 2021-11-20 MED ORDER — CHLORHEXIDINE GLUCONATE CLOTH 2 % EX PADS
6.0000 | MEDICATED_PAD | Freq: Every day | CUTANEOUS | Status: DC
  Administered 2021-11-20 – 2021-11-23 (×3): 6 via TOPICAL

## 2021-11-20 MED ORDER — MIDODRINE HCL 5 MG PO TABS
ORAL_TABLET | ORAL | Status: AC
  Filled 2021-11-20: qty 2

## 2021-11-20 NOTE — Progress Notes (Signed)
Dr. Dwyane Dee aware of patient's vitals and c/o chest pain this morning. EKG performed and trops sent; maalox provided, scheduled midodrine given. ? ? ? 11/20/21 0831  ?Vitals  ?Temp 98.2 ?F (36.8 ?C)  ?Temp Source Oral  ?BP (!) 78/34  ?MAP (mmHg) (!) 48  ?BP Location Left Arm  ?BP Method Automatic  ?Patient Position (if appropriate) Lying  ?Pulse Rate (!) 50  ?Pulse Rate Source Monitor  ?ECG Heart Rate (!) 56  ?Resp 16  ?Level of Consciousness  ?Level of Consciousness Alert  ?MEWS COLOR  ?MEWS Score Color Yellow  ?Oxygen Therapy  ?SpO2 99 %  ?O2 Device Nasal Cannula  ?Pain Assessment  ?Pain Score 6  ?Pain Type Acute pain  ?Pain Location Chest  ?Pain Descriptors / Indicators Other (Comment) ?("nagging")  ?MEWS Score  ?MEWS Temp 0  ?MEWS Systolic 2  ?MEWS Pulse 0  ?MEWS RR 0  ?MEWS LOC 0  ?MEWS Score 2  ? ? ?

## 2021-11-20 NOTE — Progress Notes (Signed)
[facility] ?[place], [place] ?11/20/21 ? ?Subjective:  ? ?LOS: 0 ?Patient was brought to the ED via EMS from dialysis for hypotension.  She received 2 hours of treatment which was stopped because of low blood pressure. ?She also complained of chest pain during dialysis. ?She was hypotensive on arrival at 77/42.  She received multiple small boluses of fluids.  Approximately 2 L given over time. ?This morning she was feeling well.  Ate full breakfast.  Denies any shortness of breath. ?Seen during dialysis.  Given 150 cc bolus.  Noted to be atrial fibrillation and bradycardic. ?  ?HEMODIALYSIS FLOWSHEET: ? ?Blood Flow Rate (mL/min): 400 mL/min ?Arterial Pressure (mmHg): -190 mmHg ?Venous Pressure (mmHg): 160 mmHg ?Transmembrane Pressure (mmHg): 50 mmHg ?Ultrafiltration Rate (mL/min): 140 mL/min ?Dialysate Flow Rate (mL/min): 600 ml/min ?Conductivity: Machine : 14 ?Conductivity: Machine : 14  ?  ?BP Readings from Last 1 Encounters:  ?11/20/21 (!) 90/54  ? ? ? ?Objective:  ?Vital signs in last 24 hours:  ?Temp:  [97.4 ?F (36.3 ?C)-98.3 ?F (36.8 ?C)] 98.2 ?F (36.8 ?C) (04/15 0831) ?Pulse Rate:  [49-58] 50 (04/15 0831) ?Resp:  [12-20] 16 (04/15 0831) ?BP: (77-104)/(30-66) 78/34 (04/15 0831) ?SpO2:  [93 %-100 %] 99 % (04/15 0831) ?Weight:  [120.2 kg] 120.2 kg (04/14 1555) ? ?Weight change:  ?Filed Weights  ? 11/19/21 1555  ?Weight: 120.2 kg  ? ? ?Intake/Output: ?  ? ?Intake/Output Summary (Last 24 hours) at 11/20/2021 0934 ?Last data filed at 11/19/2021 1827 ?Gross per 24 hour  ?Intake 500 ml  ?Output --  ?Net 500 ml  ? ? ? ?Physical Exam: ?General: No acute distress, lying in the bed  ?HEENT Moist oral mucous membranes  ?Pulm/lungs Clear to auscultation, [place] O2  ?CVS/Heart A-fib, irregular  ?Abdomen:  Soft, nontender  ?Extremities: No edema  ?Neurologic: Alert, oriented  ?Skin: No acute rashes  ?Access: Right forearm AV fistula  ?   ? ? ?Basic Metabolic Panel: ? ?Recent Labs  ?Lab 11/19/21 ?1629  ?NA 134*   ?K 3.9  ?CL 95*  ?CO2 30  ?GLUCOSE 98  ?BUN 41*  ?CREATININE 5.23*  ?CALCIUM 9.0  ? ? ? ?CBC: ?Recent Labs  ?Lab 11/19/21 ?1557 11/20/21 ?6568  ?WBC 4.2 3.9*  ?HGB 11.4* 10.6*  ?HCT 35.8* 32.6*  ?MCV 98.4 96.7  ?PLT 148* 143*  ? ? ?  ?Lab Results  ?Component Value Date  ? HEPBSAG NON REACTIVE 04/29/2021  ? HEPBSAB Reactive (A) 04/29/2021  ? ? ? ? ?Microbiology: ? ?Recent Results (from the past 240 hour(s))  ?Blood Culture (routine x 2)     Status: None (Preliminary result)  ? Collection Time: 11/19/21  4:14 PM  ? Specimen: BLOOD  ?Result Value Ref Range Status  ? Specimen Description BLOOD BLOOD LEFT FOREARM  Final  ? Special Requests   Final  ?  BOTTLES DRAWN AEROBIC AND ANAEROBIC Blood Culture adequate volume  ? Culture   Final  ?  NO GROWTH < 12 HOURS ?Performed at [facility], [address] [place], [place] [address] ?  ? Report Status PENDING  Incomplete  ?Blood Culture (routine x 2)     Status: None (Preliminary result)  ? Collection Time: 11/19/21  4:19 PM  ? Specimen: BLOOD  ?Result Value Ref Range Status  ? Specimen Description BLOOD BLOOD LEFT HAND  Final  ? Special Requests   Final  ?  BOTTLES DRAWN AEROBIC AND ANAEROBIC Blood Culture adequate volume  ? Culture  Final  ?  NO GROWTH < 12 HOURS ?Performed at [facility], [address] [place], [place] [address] ?  ? Report Status PENDING  Incomplete  ?Resp Panel by RT-PCR (Flu A&B, Covid) Nasopharyngeal Swab     Status: None  ? Collection Time: 11/19/21  4:29 PM  ? Specimen: Nasopharyngeal Swab; Nasopharyngeal(NP) swabs in vial transport medium  ?Result Value Ref Range Status  ? SARS Coronavirus 2 by RT PCR NEGATIVE NEGATIVE Final  ?  Comment: (NOTE) ?SARS-CoV-2 target nucleic acids are NOT DETECTED. ? ?The SARS-CoV-2 RNA is generally detectable in upper respiratory ?specimens during the acute phase of infection. The lowest ?concentration of SARS-CoV-2 viral copies this assay can detect is ?138 copies/mL. A negative result does  not preclude SARS-Cov-2 ?infection and should not be used as the sole basis for treatment or ?other patient management decisions. A negative result may occur with  ?improper specimen collection/handling, submission of specimen other ?than nasopharyngeal swab, presence of viral mutation(s) within the ?areas targeted by this assay, and inadequate number of viral ?copies(<138 copies/mL). A negative result must be combined with ?clinical observations, patient history, and epidemiological ?information. The expected result is Negative. ? ?Fact Sheet for Patients:  ?EntrepreneurPulse.com.au ? ?Fact Sheet for Healthcare Providers:  ?IncredibleEmployment.be ? ?This test is no t yet approved or cleared by the [place] FDA and  ?has been authorized for detection and/or diagnosis of SARS-CoV-2 by ?FDA under an Emergency Use Authorization (EUA). This EUA will remain  ?in effect (meaning this test can be used) for the duration of the ?COVID-19 declaration under Section 564(b)(1) of the Act, 21 ?U.S.C.section 360bbb-3(b)(1), unless the authorization is terminated  ?or revoked sooner.  ? ? ?  ? Influenza A by PCR NEGATIVE NEGATIVE Final  ? Influenza B by PCR NEGATIVE NEGATIVE Final  ?  Comment: (NOTE) ?The Xpert Xpress SARS-CoV-2/FLU/RSV plus assay is intended as an aid ?in the diagnosis of influenza from Nasopharyngeal swab specimens and ?should not be used as a sole basis for treatment. Nasal washings and ?aspirates are unacceptable for Xpert Xpress SARS-CoV-2/FLU/RSV ?testing. ? ?Fact Sheet for Patients: ?EntrepreneurPulse.com.au ? ?Fact Sheet for Healthcare Providers: ?IncredibleEmployment.be ? ?This test is not yet approved or cleared by the [place] FDA and ?has been authorized for detection and/or diagnosis of SARS-CoV-2 by ?FDA under an Emergency Use Authorization (EUA). This EUA will remain ?in effect (meaning this test can be used) for the  duration of the ?COVID-19 declaration under Section 564(b)(1) of the Act, 21 U.S.C. ?section 360bbb-3(b)(1), unless the authorization is terminated or ?revoked. ? ?Performed at [facility] [place], ?[place] [address] ?  ? ? ?Coagulation Studies: ?Recent Labs  ?  11/19/21 ?1629  ?LABPROT 15.1  ?INR 1.2  ? ? ?Urinalysis: ?No results for input(s): COLORURINE, LABSPEC, [place], GLUCOSEU, HGBUR, BILIRUBINUR, KETONESUR, PROTEINUR, UROBILINOGEN, NITRITE, LEUKOCYTESUR in the last 72 hours. ? ?Invalid input(s): APPERANCEUR  ? ? ?Imaging: ?DG Chest 2 View ? ?Result Date: 11/19/2021 ?CLINICAL DATA:  Shortness of breath. Chest pain. Hypotension at dialysis. EXAM: CHEST - 2 VIEW COMPARISON:  AP chest 05/18/2021, 04/24/2021 08/07/2020. FINDINGS: Cardiac silhouette is again moderately enlarged. Moderate calcification is again seen within the aortic arch. There are moderately decreased lung volumes with moderate right diaphragmatic elevation, similar to prior. Resolution of the prior moderate bilateral interstitial thickening; the current study has less interstitial thickening than 05/18/2021 04/24/2021 and 08/07/2020 prior radiographs. There is left costophrenic angle horizontal linear scarring, similar to prior. No  definite pleural effusion is seen. No pneumothorax. Mild-to-moderate multilevel degenerative disc changes of the thoracic spine. IMPRESSION: Resolution of the prior interstitial pulmonary edema. Low lung volumes. No definite acute lung process. Electronically Signed   By: Yvonne Kendall M.D.   On: 11/19/2021 17:04  ? ?CT Angio Chest/Abd/Pel for Dissection W and/or Wo Contrast ? ?Result Date: 11/19/2021 ?CLINICAL DATA:  Acute aortic syndrome (AAS) suspected Hypotension during dialysis.  Chest pain and shortness of. EXAM: CT ANGIOGRAPHY CHEST, ABDOMEN AND PELVIS TECHNIQUE: Non-contrast CT of the chest was initially obtained. Multidetector CT imaging through the chest, abdomen and pelvis was  performed using the standard protocol during bolus administration of intravenous contrast. Multiplanar reconstructed images and MIPs were obtained and reviewed to evaluate the vascular anatomy. RADIATION DOSE

## 2021-11-20 NOTE — Progress Notes (Signed)
?Progress Note ? ? ?Patient: Cassandra Joseph AYT:[id number] DOB: June 02, 1946 DOA: 11/19/2021     0 ? ?DOS: the patient was seen and examined on 11/20/2021 ?  ?Brief hospital course: ?This 76 years old female with PMH significant for ESRD on hemodialysis, class III obesity, OSA on CPAP, chronic hypoxic respiratory failure on 3 L of supplemental oxygen at baseline, paroxysmal A-fib on Eliquis, rheumatoid arthritis, multiple myeloma, chronic hypotension on midodrine who was sent from dialysis center due to low blood pressure.  Dialysis was stopped within 2 hours due to patient having low blood pressure.  Patient was also complaining of a stabbing chest pain and decreased appetite.  On arrival her blood pressure was 77/ 42 with pulse 51.  CTA chest shows no acute aortic findings or acute abnormality in the abdomen and pelvis.  Patient was given IV fluid bolus and midodrine with improvement in her blood pressure.  Nephrology was consulted, Patient is scheduled to have dialysis today. ? ?Assessment and Plan: ?* Hypotension ?Suspect secondary to dehydration from dialysis and poor oral intake. ?No signs of infection or other acute illness to explain hypotension at this time. ?Continue midodrine, Continue IV fluid bolus. ?So far patient has received 2 L of IV fluids.  Feels better and been eating. ?Blood pressure has improved. ? ?Atrial fibrillation with slow ventricular response ([place]) ?Hold carvedilol ?Continue Eliquis ? ?ESRD on hemodialysis ([place]) ?Nephrology consulted for continuation of hemodialysis. ?Patient is scheduled to have dialysis today. ? ?Obesity, Class III, BMI 40-49.9 (morbid obesity) ([place]) ?Complicating factor to overall prognosis and care ? ?OSA on CPAP ?Continue CPAP nightly ? ?Rheumatoid arthritis ([place]) ?Continue hydroxychloroquine.  Continue hydrocodone as needed ? ?Chronic respiratory failure with hypoxia (HCC) ?Patient is at baseline O2 requirement ? ?Chronic anticoagulation ?Continue Eliquis ? ?Multiple  myeloma ([place]) ?Last seen by Dr. Rogue Bussing in May 2022. ? ?  ? ?Estimated body mass index is 40.92 kg/m? as calculated from the following: ?  Height as of this encounter: _0  (1.676 m). ?  Weight as of this encounter: 115 kg.  ? ?Subjective: Patient was seen and examined at bedside.  Overnight events noted.  Patient reports feeling improved. ?Her blood pressure has improved with midodrine and fluid bolus.  She is going to have dialysis today. ? ?Physical Exam: ?Vitals:  ? 11/20/21 1236 11/20/21 1245 11/20/21 1300 11/20/21 1315  ?BP:  (!) 98/51 (!) 90/54 (!) 99/49  ?Pulse: (!) 47 (!) 47 (!) 48 (!) 54  ?Resp: _1 ?Temp:      ?TempSrc:      ?SpO2:      ?Weight:      ?Height:      ? ?General exam: Appears comfortable, deconditioned, not in any acute distress. ?Respiratory system: CTA bilaterally, no wheezing, no crackles, normal respiratory effort. ?Cardiovascular system: S1-S2 heard, regular rate and rhythm, no murmur. ?Gastrointestinal system:  Abdomen is soft, nontender, nondistended, BS+ ?Central nervous system: Alert, oriented x2, no focal neurological deficits ?Extremities: No edema, no cyanosis, no clubbing. ?Psychiatry: Mood, insight, judgment normal ? ?Data Reviewed: ?I have Reviewed nursing notes, Vitals, and Lab results since pt's last encounter. Pertinent lab results CBC, CMP, mag, Phos ?I have ordered test including CBC, CMP ?I have reviewed the last note from nephrologist,  ?I have discussed pt's care plan and test results with patient.  ?Family Communication: No family at bedside. ? ? ?Disposition: ?Status is: Observation ?The patient remains OBS appropriate and will d/c before 2 midnights. ? ?Admitted  for hypotension during hemodialysis.  She has not completed dialysis was sent in the ED blood pressure has improved.  Nephrology is consulted patient is going to have dialysis today. ? ?Anticipate discharge home in 1 to 2 days. ? ? ? Planned Discharge Destination: Home ? ? ? ?Time spent: 50  minutes ? ?Author: ?Shawna Clamp, MD ?11/20/2021 1:32 PM ? ?For on call review [email or web address]  ?

## 2021-11-20 NOTE — Progress Notes (Signed)
MD and dialysis RN aware of BP. Dialysis RN states she will give midodrine. ?

## 2021-11-20 NOTE — Progress Notes (Addendum)
Sharion Settler, NP made aware of hypotension. No further orders received to treat hypotension at this time ? 11/20/21 0324  ?Vitals  ?Temp (!) 97.4 ?F (36.3 ?C)  ?Temp Source Oral  ?BP (!) 87/44  ?MAP (mmHg) (!) 57  ?BP Location Left Arm  ?BP Method Automatic  ?Patient Position (if appropriate) Lying  ?ECG Heart Rate (!) 53  ?Resp 16  ?Level of Consciousness  ?Level of Consciousness Alert  ?MEWS COLOR  ?MEWS Score Color Green  ?Oxygen Therapy  ?SpO2 100 %  ?O2 Device Nasal Cannula  ?O2 Flow Rate (L/min) 3 L/min  ?MEWS Score  ?MEWS Temp 0  ?MEWS Systolic 1  ?MEWS Pulse 0  ?MEWS RR 0  ?MEWS LOC 0  ?MEWS Score 1  ? ? ?

## 2021-11-20 NOTE — Hospital Course (Addendum)
This 76 years old female with PMH significant for ESRD on hemodialysis, class III obesity, OSA on CPAP, chronic hypoxic respiratory failure on 3 L of supplemental oxygen at baseline, paroxysmal A-fib on Eliquis, rheumatoid arthritis, multiple myeloma, chronic hypotension on midodrine who was sent from dialysis center due to low blood pressure.  Dialysis was stopped within 2 hours due to patient having low blood pressure.  Patient was also complaining of a stabbing chest pain and decreased appetite.  On arrival her blood pressure was 77/ 42 with pulse 51.  CTA chest shows no acute aortic findings or acute abnormality in the abdomen and pelvis.  Patient was given IV fluid boluses and midodrine with improvement in her blood pressure.  Nephrology was consulted, Patient successfully had hemodialysis in the hospital 3 days in a row.  Her blood pressure has improved.  Nephrology signed off recommended patient can continue outpatient dialysis as per schedule on Monday Wednesday and Friday schedule.  Patient is being discharged home. Home and services been arranged. ?

## 2021-11-20 NOTE — Progress Notes (Signed)
Hemodialysis Post Treatment Note: ?  ?Treatment date: 11/20/2021 ?  ?Treatment time:  3.5 hours  ?  ?Access:  Right AVF ?  ?UF Removed:  -200 ml ?  ?Next Treatment: 11/22/21 ?  ?Prior to start of treatment, Dr. Candiss Norse notified of patient asymptomatic bradycardia (HR dropping into the 30's and 40's unsustained). Order given to administer 200 ml of NS and start dialysis as ordered. Dialysis treatment started, no adverse effects noted or reported. Patient completed 3.5 hours of dialysis with no adverse effects noted or reported. Patient hemodynamically stable, report given to floor nurse Levada Schilling, RN. ?  ?

## 2021-11-21 LAB — HEPATITIS B SURFACE ANTIGEN: Hepatitis B Surface Ag: NONREACTIVE

## 2021-11-21 LAB — HEPATITIS B SURFACE ANTIBODY,QUALITATIVE: Hep B S Ab: REACTIVE — AB

## 2021-11-21 MED ORDER — OXYCODONE-ACETAMINOPHEN 5-325 MG PO TABS
1.0000 | ORAL_TABLET | Freq: Once | ORAL | Status: AC
  Administered 2021-11-21: 1 via ORAL
  Filled 2021-11-21: qty 1

## 2021-11-21 NOTE — Progress Notes (Signed)
?Progress Note ? ? ?Patient: Cassandra Joseph XIH:[id number] DOB: June 04, 1946 DOA: 11/19/2021     1 ? ?DOS: the patient was seen and examined on 11/21/2021 ?  ?Brief hospital course: ?This 76 years old female with PMH significant for ESRD on hemodialysis, class III obesity, OSA on CPAP, chronic hypoxic respiratory failure on 3 L of supplemental oxygen at baseline, paroxysmal A-fib on Eliquis, rheumatoid arthritis, multiple myeloma, chronic hypotension on midodrine who was sent from dialysis center due to low blood pressure.  Dialysis was stopped within 2 hours due to patient having low blood pressure.  Patient was also complaining of a stabbing chest pain and decreased appetite.  On arrival her blood pressure was 77/ 42 with pulse 51.  CTA chest shows no acute aortic findings or acute abnormality in the abdomen and pelvis.  Patient was given IV fluid boluses and midodrine with improvement in her blood pressure.  Nephrology was consulted, Patient successfully had hemodialysis yesterday.  Her blood pressure has improved.  ? ?Assessment and Plan: ?* Hypotension ?Suspect secondary to dehydration from dialysis and poor oral intake. ?No signs of infection or other acute illness to explain hypotension at this time. ?Continue midodrine, Continue IV fluid bolus. ?So far patient has received 2 L of IV fluids.  Feels better and been eating. ?Blood pressure has improved. ? ?Atrial fibrillation with slow ventricular response ([place]) ?Hold carvedilol ?Continue Eliquis ? ?ESRD on hemodialysis ([place]) ?Nephrology consulted for continuation of hemodialysis. ?Patient had completed dialysis yesterday. ?Her blood pressure has improved ? ?Obesity, Class III, BMI 40-49.9 (morbid obesity) ([place]) ?Complicating factor to overall prognosis and care ? ?OSA on CPAP ?Continue CPAP nightly ? ?Rheumatoid arthritis ([place]) ?Continue hydroxychloroquine.  Continue hydrocodone as needed ? ?Chronic respiratory failure with hypoxia (HCC) ?Patient is at baseline  O2 requirement ? ?Chronic anticoagulation ?Continue Eliquis ? ?Multiple myeloma ([place]) ?Last seen by Dr. Rogue Bussing in May 2022. ? ?  ? ?Estimated body mass index is 41.13 kg/m? as calculated from the following: ?  Height as of this encounter: 5' 6"  (1.676 m). ?  Weight as of this encounter: 115.6 kg.  ? ?Subjective: Patient was seen and examined at bedside.  Overnight events noted.   ?Patient reports feeling better.  Her blood pressure has improved with midodrine and after dialysis. ?Patient reports having pain in the right leg. ? ?Physical Exam: ?Vitals:  ? 11/20/21 2346 11/21/21 0417 11/21/21 0949 11/21/21 1239  ?BP: (!) 86/45 (!) 96/43 (!) 92/39 100/77  ?Pulse: (!) 59 62 67 93  ?Resp: 18 16 18 18   ?Temp: 99.1 ?F (37.3 ?C) 98.6 ?F (37 ?C) 97.9 ?F (36.6 ?C) 98.1 ?F (36.7 ?C)  ?TempSrc: Oral Oral    ?SpO2: 100% 100% 100% (!) 84%  ?Weight:      ?Height:      ? ?General exam: Appears deconditioned, comfortable, not in any distress. ?Respiratory system: CTA bilaterally, no wheezing, no crackles, normal respiratory effort. ?Cardiovascular system: S1-S2 heard, regular rate and rhythm, no murmur. ?Gastrointestinal system:  Abdomen is soft, nontender, nondistended, BS+ ?Central nervous system: Alert, oriented x 2, no focal neurological deficits ?Extremities: No edema, no cyanosis, no clubbing. ?Psychiatry: Mood, insight, judgment normal ? ?Data Reviewed: ?I have Reviewed nursing notes, Vitals, and Lab results since pt's last encounter. Pertinent lab results CBC, BMP ?I have ordered test including CBC, BMP ?I have reviewed the last note from nephrologist,  ?I have discussed pt's care plan and test results with patient.  ? ? ?Disposition: ?Status is: Inpatient ?Remains  inpatient appropriate because: ?  ?Admitted for hypotension during hemodialysis.  She has not completed dialysis and was sent in the ED.  blood pressure has improved.  Nephrology is consulted, Patient had hemodialysis,  tolerated well.  Blood pressure is  improved. ? ?Anticipate discharge home in 1 to 2 days. ? ? ? Planned Discharge Destination: Home ? ? ? ? ?Time spent: 35 minutes ? ?Author: ?Shawna Clamp, MD ?11/21/2021 1:34 PM ? ?For on call review [email or web address]  ?

## 2021-11-21 NOTE — Progress Notes (Signed)
[facility] ?[place], [place] ?11/21/21 ? ?Subjective:  ? ?LOS: 1 ?Patient was brought to the ED via EMS from dialysis for hypotension.  She received 2 hours of treatment which was stopped because of low blood pressure. ?She also complained of chest pain during dialysis. ?She was hypotensive on arrival at 77/42.  She received multiple small boluses of fluids.  Approximately 2 L given over time. ?This morning she was feeling well.  Ate full breakfast.  Denies any shortness of breath. ?Tolerated dialysis well.  Hoping she will get discharged today. ?  ?BP Readings from Last 1 Encounters:  ?11/21/21 (!) 92/39  ? ? ? ?Objective:  ?Vital signs in last 24 hours:  ?Temp:  [97.8 ?F (36.6 ?C)-99.1 ?F (37.3 ?C)] 97.9 ?F (36.6 ?C) (04/16 6967) ?Pulse Rate:  [45-108] 67 (04/16 0949) ?Resp:  [12-24] 18 (04/16 0949) ?BP: (79-115)/(33-65) 92/39 (04/16 8938) ?SpO2:  [99 %-100 %] 100 % (04/16 0949) ?Weight:  [115 kg-115.6 kg] 115.6 kg (04/15 1630) ? ?Weight change: -5.203 kg ?Filed Weights  ? 11/19/21 1555 11/20/21 1216 11/20/21 1630  ?Weight: 120.2 kg 115 kg 115.6 kg  ? ? ?Intake/Output: ?  ? ?Intake/Output Summary (Last 24 hours) at 11/21/2021 1118 ?Last data filed at 11/20/2021 2000 ?Gross per 24 hour  ?Intake 240 ml  ?Output -200 ml  ?Net 440 ml  ? ? ? ? ?Physical Exam: ?General: No acute distress, lying in the bed  ?HEENT Moist oral mucous membranes  ?Pulm/lungs Clear to auscultation, [place] O2  ?CVS/Heart A-fib, irregular  ?Abdomen:  Soft, nontender  ?Extremities: No edema  ?Neurologic: Alert, oriented  ?Skin: No acute rashes  ?Access: Right forearm AV fistula  ?   ? ? ?Basic Metabolic Panel: ? ?Recent Labs  ?Lab 11/19/21 ?1629  ?NA 134*  ?K 3.9  ?CL 95*  ?CO2 30  ?GLUCOSE 98  ?BUN 41*  ?CREATININE 5.23*  ?CALCIUM 9.0  ? ? ? ? ?CBC: ?Recent Labs  ?Lab 11/19/21 ?1557 11/20/21 ?1017  ?WBC 4.2 3.9*  ?HGB 11.4* 10.6*  ?HCT 35.8* 32.6*  ?MCV 98.4 96.7  ?PLT 148* 143*  ? ? ? ?  ?Lab Results  ?Component Value Date  ?  HEPBSAG NON REACTIVE 11/20/2021  ? HEPBSAB Reactive (A) 11/20/2021  ? ? ? ? ?Microbiology: ? ?Recent Results (from the past 240 hour(s))  ?Blood Culture (routine x 2)     Status: None (Preliminary result)  ? Collection Time: 11/19/21  4:14 PM  ? Specimen: BLOOD  ?Result Value Ref Range Status  ? Specimen Description BLOOD BLOOD LEFT FOREARM  Final  ? Special Requests   Final  ?  BOTTLES DRAWN AEROBIC AND ANAEROBIC Blood Culture adequate volume  ? Culture   Final  ?  NO GROWTH 2 DAYS ?Performed at [facility], [address] [place], [place] [address] ?  ? Report Status PENDING  Incomplete  ?Blood Culture (routine x 2)     Status: None (Preliminary result)  ? Collection Time: 11/19/21  4:19 PM  ? Specimen: BLOOD  ?Result Value Ref Range Status  ? Specimen Description BLOOD BLOOD LEFT HAND  Final  ? Special Requests   Final  ?  BOTTLES DRAWN AEROBIC AND ANAEROBIC Blood Culture adequate volume  ? Culture   Final  ?  NO GROWTH 2 DAYS ?Performed at [facility], [address] [place], [place] [address] ?  ? Report Status PENDING  Incomplete  ?Resp Panel by RT-PCR (Flu A&B, Covid) Nasopharyngeal Swab  Status: None  ? Collection Time: 11/19/21  4:29 PM  ? Specimen: Nasopharyngeal Swab; Nasopharyngeal(NP) swabs in vial transport medium  ?Result Value Ref Range Status  ? SARS Coronavirus 2 by RT PCR NEGATIVE NEGATIVE Final  ?  Comment: (NOTE) ?SARS-CoV-2 target nucleic acids are NOT DETECTED. ? ?The SARS-CoV-2 RNA is generally detectable in upper respiratory ?specimens during the acute phase of infection. The lowest ?concentration of SARS-CoV-2 viral copies this assay can detect is ?138 copies/mL. A negative result does not preclude SARS-Cov-2 ?infection and should not be used as the sole basis for treatment or ?other patient management decisions. A negative result may occur with  ?improper specimen collection/handling, submission of specimen other ?than nasopharyngeal swab, presence of viral  mutation(s) within the ?areas targeted by this assay, and inadequate number of viral ?copies(<138 copies/mL). A negative result must be combined with ?clinical observations, patient history, and epidemiological ?information. The expected result is Negative. ? ?Fact Sheet for Patients:  ?EntrepreneurPulse.com.au ? ?Fact Sheet for Healthcare Providers:  ?IncredibleEmployment.be ? ?This test is no t yet approved or cleared by the [place] FDA and  ?has been authorized for detection and/or diagnosis of SARS-CoV-2 by ?FDA under an Emergency Use Authorization (EUA). This EUA will remain  ?in effect (meaning this test can be used) for the duration of the ?COVID-19 declaration under Section 564(b)(1) of the Act, 21 ?U.S.C.section 360bbb-3(b)(1), unless the authorization is terminated  ?or revoked sooner.  ? ? ?  ? Influenza A by PCR NEGATIVE NEGATIVE Final  ? Influenza B by PCR NEGATIVE NEGATIVE Final  ?  Comment: (NOTE) ?The Xpert Xpress SARS-CoV-2/FLU/RSV plus assay is intended as an aid ?in the diagnosis of influenza from Nasopharyngeal swab specimens and ?should not be used as a sole basis for treatment. Nasal washings and ?aspirates are unacceptable for Xpert Xpress SARS-CoV-2/FLU/RSV ?testing. ? ?Fact Sheet for Patients: ?EntrepreneurPulse.com.au ? ?Fact Sheet for Healthcare Providers: ?IncredibleEmployment.be ? ?This test is not yet approved or cleared by the [place] FDA and ?has been authorized for detection and/or diagnosis of SARS-CoV-2 by ?FDA under an Emergency Use Authorization (EUA). This EUA will remain ?in effect (meaning this test can be used) for the duration of the ?COVID-19 declaration under Section 564(b)(1) of the Act, 21 U.S.C. ?section 360bbb-3(b)(1), unless the authorization is terminated or ?revoked. ? ?Performed at [facility], [place], ?[place] [address] ?  ? ? ?Coagulation Studies: ?Recent  Labs  ?  11/19/21 ?1629  ?LABPROT 15.1  ?INR 1.2  ? ? ? ?Urinalysis: ?No results for input(s): COLORURINE, LABSPEC, [place], GLUCOSEU, HGBUR, BILIRUBINUR, KETONESUR, PROTEINUR, UROBILINOGEN, NITRITE, LEUKOCYTESUR in the last 72 hours. ? ?Invalid input(s): APPERANCEUR  ? ? ?Imaging: ?DG Chest 2 View ? ?Result Date: 11/19/2021 ?CLINICAL DATA:  Shortness of breath. Chest pain. Hypotension at dialysis. EXAM: CHEST - 2 VIEW COMPARISON:  AP chest 05/18/2021, 04/24/2021 08/07/2020. FINDINGS: Cardiac silhouette is again moderately enlarged. Moderate calcification is again seen within the aortic arch. There are moderately decreased lung volumes with moderate right diaphragmatic elevation, similar to prior. Resolution of the prior moderate bilateral interstitial thickening; the current study has less interstitial thickening than 05/18/2021 04/24/2021 and 08/07/2020 prior radiographs. There is left costophrenic angle horizontal linear scarring, similar to prior. No definite pleural effusion is seen. No pneumothorax. Mild-to-moderate multilevel degenerative disc changes of the thoracic spine. IMPRESSION: Resolution of the prior interstitial pulmonary edema. Low lung volumes. No definite acute lung process. Electronically Signed   By: Viann Fish.D.  On: 11/19/2021 17:04  ? ?CT Angio Chest/Abd/Pel for Dissection W and/or Wo Contrast ? ?Result Date: 11/19/2021 ?CLINICAL DATA:  Acute aortic syndrome (AAS) suspected Hypotension during dialysis.  Chest pain and shortness of. EXAM: CT ANGIOGRAPHY CHEST, ABDOMEN AND PELVIS TECHNIQUE: Non-contrast CT of the chest was initially obtained. Multidetector CT imaging through the chest, abdomen and pelvis was performed using the standard protocol during bolus administration of intravenous contrast. Multiplanar reconstructed images and MIPs were obtained and reviewed to evaluate the vascular anatomy. RADIATION DOSE REDUCTION: This exam was performed according to the departmental  dose-optimization program which includes automated exposure control, adjustment of the mA and/or kV according to patient size and/or use of iterative reconstruction technique. CONTRAST:  161m OMNIPAQUE IOHEXOL 350 MG/

## 2021-11-22 LAB — HEPATITIS B SURFACE ANTIBODY, QUANTITATIVE: Hep B S AB Quant (Post): 42.7 m[IU]/mL (ref >9.9)

## 2021-11-22 MED ORDER — OXYCODONE-ACETAMINOPHEN 5-325 MG PO TABS
1.0000 | ORAL_TABLET | Freq: Once | ORAL | Status: AC
  Administered 2021-11-22: 1 via ORAL
  Filled 2021-11-22: qty 1

## 2021-11-22 MED ORDER — MIDODRINE HCL 5 MG PO TABS
ORAL_TABLET | ORAL | Status: AC
  Filled 2021-11-22: qty 2

## 2021-11-22 NOTE — Progress Notes (Signed)
BP dropped again. Midodrine just given. UF turned off, patient has less than 10 minutes left of treatment. Patient asymptomatic.  ?

## 2021-11-22 NOTE — Discharge Instructions (Signed)
Advised to follow-up with primary care physician in 1 week. ?Advised to follow-up with nephrology as well for dialysis ?Advised to continue midodrine 10 mg 3 times daily for hypertension. ?Advised to hold Coreg and amlodipine for hypertension. ?

## 2021-11-22 NOTE — TOC Transition Note (Signed)
Transition of Care (TOC) - CM/SW Discharge Note ? ? ?Patient Details  ?Name: Cassandra Joseph ?MRN: [id number] ?Date of Birth: 1945/12/26 ? ?Transition of Care (TOC) CM/SW Contact:  ?Alberteen Sam, LCSW ?Phone Number: ?11/22/2021, 10:35 AM ? ? ?Clinical Narrative:    ? ?Patient to discharge home today after HD, is currently active with [facility] for RN, CSW has requested orders from MD. Corene Cornea with [facility] informed of dc today.  ? ?No further dc needs identified at this time.  ? ? ?Final next level of care: [place] ?Barriers to Discharge: No Barriers Identified ? ? ?Patient Goals and CMS Choice ?Patient states their goals for this hospitalization and ongoing recovery are:: to og home ?CMS Medicare.gov Compare Post Acute Care list provided to:: Patient ?Choice offered to / list presented to : Patient ? ?Discharge Placement ?  ?           ?  ?  ?  ?  ? ?Discharge Plan and Services ?  ?  ?           ?  ?  ?  ?  ?  ?HH Arranged: RN ?[place] Agency: Microbiologist ([place]) ?Date HH Agency Contacted: 11/22/21 ?Time [place]: 3419 ?Representative spoke with at [place]: Corene Cornea ? ?Social Determinants of Health (SDOH) Interventions ?  ? ? ?Readmission Risk Interventions ? ?  05/21/2021  ? 12:18 PM 08/11/2020  ?  2:01 PM 06/25/2019  ?  3:08 PM  ?Readmission Risk Prevention Plan  ?Transportation Screening Complete Complete Complete  ?PCP or Specialist Appt within 3-5 Days  Complete Complete  ?[place] or Home Care Consult  Complete Complete  ?Social Work Consult for [place] Planning/Counseling  Complete Complete  ?Palliative Care Screening  Not Applicable Not Applicable  ?Medication Review Press photographer) Complete Complete Complete  ?PCP or Specialist appointment within 3-5 days of discharge Complete    ?[place] or Home Care Consult Complete    ?SW Recovery Care/Counseling Consult Complete    ?Palliative Care Screening Not Applicable    ?[place] Not Applicable    ? ? ? ? ? ?

## 2021-11-22 NOTE — Progress Notes (Signed)
[facility] ?[place], [place] ?11/22/21 ? ?Subjective:  ? ?LOS: 2 ? ?Patient resting quietly  ?Complains of poor appetite and fatigue ?Remains on 3L ? ?  ?BP Readings from Last 1 Encounters:  ?11/22/21 (!) 98/53  ? ? ? ?Objective:  ?Vital signs in last 24 hours:  ?Temp:  [97.9 ?F (36.6 ?C)-98.6 ?F (37 ?C)] 98.4 ?F (36.9 ?C) (04/17 1310) ?Pulse Rate:  [60-69] 69 (04/17 1310) ?Resp:  [17-20] 20 (04/17 1310) ?BP: (98-115)/(45-60) 98/53 (04/17 1310) ?SpO2:  [94 %-99 %] 95 % (04/17 1310) ? ?Weight change:  ?Filed Weights  ? 11/19/21 1555 11/20/21 1216 11/20/21 1630  ?Weight: 120.2 kg 115 kg 115.6 kg  ? ? ?Intake/Output: ?  ? ?Intake/Output Summary (Last 24 hours) at 11/22/2021 1342 ?Last data filed at 11/22/2021 1300 ?Gross per 24 hour  ?Intake 840 ml  ?Output --  ?Net 840 ml  ? ? ? ? ?Physical Exam: ?General: No acute distress, lying in the bed  ?HEENT Moist oral mucous membranes  ?Pulm/lungs Basilar crackles, [place] O2  ?CVS/Heart A-fib, irregular  ?Abdomen:  Soft, nontender  ?Extremities: No edema  ?Neurologic: Alert, oriented  ?Skin: No acute rashes  ?Access: Right forearm AV fistula  ?   ? ? ?Basic Metabolic Panel: ? ?Recent Labs  ?Lab 11/19/21 ?1629  ?NA 134*  ?K 3.9  ?CL 95*  ?CO2 30  ?GLUCOSE 98  ?BUN 41*  ?CREATININE 5.23*  ?CALCIUM 9.0  ? ? ? ? ?CBC: ?Recent Labs  ?Lab 11/19/21 ?1557 11/20/21 ?6568  ?WBC 4.2 3.9*  ?HGB 11.4* 10.6*  ?HCT 35.8* 32.6*  ?MCV 98.4 96.7  ?PLT 148* 143*  ? ? ? ?  ?Lab Results  ?Component Value Date  ? HEPBSAG NON REACTIVE 11/20/2021  ? HEPBSAB Reactive (A) 11/20/2021  ? ? ? ? ?Microbiology: ? ?Recent Results (from the past 240 hour(s))  ?Blood Culture (routine x 2)     Status: None (Preliminary result)  ? Collection Time: 11/19/21  4:14 PM  ? Specimen: BLOOD  ?Result Value Ref Range Status  ? Specimen Description BLOOD BLOOD LEFT FOREARM  Final  ? Special Requests   Final  ?  BOTTLES DRAWN AEROBIC AND ANAEROBIC Blood Culture adequate volume  ? Culture   Final  ?  NO  GROWTH 3 DAYS ?Performed at [facility], [address] [place], [place] [address] ?  ? Report Status PENDING  Incomplete  ?Blood Culture (routine x 2)     Status: None (Preliminary result)  ? Collection Time: 11/19/21  4:19 PM  ? Specimen: BLOOD  ?Result Value Ref Range Status  ? Specimen Description BLOOD BLOOD LEFT HAND  Final  ? Special Requests   Final  ?  BOTTLES DRAWN AEROBIC AND ANAEROBIC Blood Culture adequate volume  ? Culture   Final  ?  NO GROWTH 3 DAYS ?Performed at [facility], [address], [place], [place] [address] ?  ? Report Status PENDING  Incomplete  ?Resp Panel by RT-PCR (Flu A&B, Covid) Nasopharyngeal Swab     Status: None  ? Collection Time: 11/19/21  4:29 PM  ? Specimen: Nasopharyngeal Swab; Nasopharyngeal(NP) swabs in vial transport medium  ?Result Value Ref Range Status  ? SARS Coronavirus 2 by RT PCR NEGATIVE NEGATIVE Final  ?  Comment: (NOTE) ?SARS-CoV-2 target nucleic acids are NOT DETECTED. ? ?The SARS-CoV-2 RNA is generally detectable in upper respiratory ?specimens during the acute phase of infection. The lowest ?concentration of SARS-CoV-2 viral copies this assay can  detect is ?138 copies/mL. A negative result does not preclude SARS-Cov-2 ?infection and should not be used as the sole basis for treatment or ?other patient management decisions. A negative result may occur with  ?improper specimen collection/handling, submission of specimen other ?than nasopharyngeal swab, presence of viral mutation(s) within the ?areas targeted by this assay, and inadequate number of viral ?copies(<138 copies/mL). A negative result must be combined with ?clinical observations, patient history, and epidemiological ?information. The expected result is Negative. ? ?Fact Sheet for Patients:  ?EntrepreneurPulse.com.au ? ?Fact Sheet for Healthcare Providers:  ?IncredibleEmployment.be ? ?This test is no t yet approved or cleared by the [place]  FDA and  ?has been authorized for detection and/or diagnosis of SARS-CoV-2 by ?FDA under an Emergency Use Authorization (EUA). This EUA will remain  ?in effect (meaning this test can be used) for the duration of the ?COVID-19 declaration under Section 564(b)(1) of the Act, 21 ?U.S.C.section 360bbb-3(b)(1), unless the authorization is terminated  ?or revoked sooner.  ? ? ?  ? Influenza A by PCR NEGATIVE NEGATIVE Final  ? Influenza B by PCR NEGATIVE NEGATIVE Final  ?  Comment: (NOTE) ?The Xpert Xpress SARS-CoV-2/FLU/RSV plus assay is intended as an aid ?in the diagnosis of influenza from Nasopharyngeal swab specimens and ?should not be used as a sole basis for treatment. Nasal washings and ?aspirates are unacceptable for Xpert Xpress SARS-CoV-2/FLU/RSV ?testing. ? ?Fact Sheet for Patients: ?EntrepreneurPulse.com.au ? ?Fact Sheet for Healthcare Providers: ?IncredibleEmployment.be ? ?This test is not yet approved or cleared by the [place] FDA and ?has been authorized for detection and/or diagnosis of SARS-CoV-2 by ?FDA under an Emergency Use Authorization (EUA). This EUA will remain ?in effect (meaning this test can be used) for the duration of the ?COVID-19 declaration under Section 564(b)(1) of the Act, 21 U.S.C. ?section 360bbb-3(b)(1), unless the authorization is terminated or ?revoked. ? ?Performed at [facility], [place], ?[place] [address] ?  ? ? ?Coagulation Studies: ?Recent Labs  ?  11/19/21 ?1629  ?LABPROT 15.1  ?INR 1.2  ? ? ? ?Urinalysis: ?No results for input(s): COLORURINE, LABSPEC, [place], GLUCOSEU, HGBUR, BILIRUBINUR, KETONESUR, PROTEINUR, UROBILINOGEN, NITRITE, LEUKOCYTESUR in the last 72 hours. ? ?Invalid input(s): APPERANCEUR  ? ? ?Imaging: ?No results found. ? ? ?Medications:  ? ? ? apixaban  2.5 mg Oral BID  ? Chlorhexidine Gluconate Cloth  6 each Topical Q0600  ? cinacalcet  60 mg Oral BID WC  ? hydroxychloroquine  200 mg Oral  Daily  ? midodrine  10 mg Oral TID WC  ? polyethylene glycol  17 g Oral BID  ? sevelamer carbonate  2,400 mg Oral TID with meals  ? ?acetaminophen **OR** acetaminophen, alum & mag hydroxide-simeth, atropine, ondansetron **OR** ondansetron (ZOFRAN) IV, sevelamer carbonate ? ?Assessment/ Plan:  ?76 y.o. female with  hypertension, anemia, multiple myeloma, chronic respiratory failure on home O2, and end stage renal disease on dialysis  was admitted on 11/19/2021 for ? ?Principal Problem: ?  Hypotension ?Active Problems: ?  ESRD on hemodialysis ([place]) ?  Multiple myeloma ([place]) ?  Chronic anticoagulation ?  Chronic respiratory failure with hypoxia (HCC) ?  Rheumatoid arthritis ([place]) ?  OSA on CPAP ?  Obesity, Class III, BMI 40-49.9 (morbid obesity) ([place]) ?  Atrial fibrillation with slow ventricular response ([place]) ? ?Bradycardia [R00.1] ?ESRD (end stage renal disease) ([place]) [N18.6] ?Hypotension [I95.9] ?Hypotension, unspecified hypotension type [I95.9] ?Chest pain, unspecified type [R07.9] ? ?[facility]/MWF/Rt lower forearm AVF ? ?#.  Hypotension  in the setting of ESRD ?Plan to dialyze today, UF goal 1.5-2L as tolerated. BP remains low. May consider midodrine during dialysis. Midodrine scheduled three times daily.  ? ?#. Anemia of CKD ? ?Lab Results  ?Component Value Date  ? HGB 10.6 (L) 11/20/2021  ?Hemoglobin at goal.   ? ?#. Secondary hyperparathyroidism of renal origin N 25.81  ? ?   ?Component Value Date/Time  ? PTH 242 (H) 09/29/2020 1119  ? ?Lab Results  ?Component Value Date  ? PHOS 4.2 05/18/2021  ? ?Calcium and phosphorus within goal.  ? ? ?#.  A-fib, bradycardia, hypotension ?Carvedilol on hold ?Management as per hospitalist team ? ? ? LOS: 2 ?[place] ?4/17/20231:42 PM ? ?[facility] ?[place], [place] ?[phone] ? ?

## 2021-11-22 NOTE — Progress Notes (Signed)
?Progress Note ? ? ?Patient: Cassandra Joseph XFG:[id number] DOB: 09/22/1945 DOA: 11/19/2021     2 ? ?DOS: the patient was seen and examined on 11/22/2021 ?  ?Brief hospital course: ?This 76 years old female with PMH significant for ESRD on hemodialysis, class III obesity, OSA on CPAP, chronic hypoxic respiratory failure on 3 L of supplemental oxygen at baseline, paroxysmal A-fib on Eliquis, rheumatoid arthritis, multiple myeloma, chronic hypotension on midodrine who was sent from dialysis center due to low blood pressure.  Dialysis was stopped within 2 hours due to patient having low blood pressure.  Patient was also complaining of a stabbing chest pain and decreased appetite.  On arrival her blood pressure was 77/ 42 with pulse 51.  CTA chest shows no acute aortic findings or acute abnormality in the abdomen and pelvis.  Patient was given IV fluid boluses and midodrine with improvement in her blood pressure.  Nephrology was consulted, Patient successfully had hemodialysis yesterday.  Her blood pressure has improved.  ? ?Assessment and Plan: ?* Hypotension ?Suspect secondary to dehydration from dialysis and poor oral intake. ?No signs of infection or other acute illness to explain hypotension at this time. ?Continue midodrine, Continue IV fluid bolus. ?So far patient has received 2 L of IV fluids.  Feels better and been eating. ?Blood pressure has improved. ? ?Atrial fibrillation with slow ventricular response ([place]) ?Hold carvedilol ?Continue Eliquis ? ?ESRD on hemodialysis ([place]) ?Nephrology consulted for continuation of hemodialysis. ?Patient had completed dialysis x 2. Next HD today. ?Her blood pressure has improved ? ?Obesity, Class III, BMI 40-49.9 (morbid obesity) ([place]) ?Complicating factor to overall prognosis and care ? ?OSA on CPAP ?Continue CPAP nightly ? ?Rheumatoid arthritis ([place]) ?Continue hydroxychloroquine.  Continue hydrocodone as needed ? ?Chronic respiratory failure with hypoxia (HCC) ?Patient is at  baseline O2 requirement ? ?Chronic anticoagulation ?Continue Eliquis ? ?Multiple myeloma ([place]) ?Last seen by Dr. Rogue Bussing in May 2022. ? ?  ? ?Estimated body mass index is 41.13 kg/m? as calculated from the following: ?  Height as of this encounter: 5' 6"  (1.676 m). ?  Weight as of this encounter: 115.6 kg.  ? ?Subjective: Patient was seen and examined at bedside.  Overnight events noted.   ?Patient reports feeling better but has developed nausea and throwing up.  She has vomiting twice. ?Blood pressure still remains low but she is going to have dialysis today with midodrine support. ? ?Physical Exam: ?Vitals:  ? 11/22/21 0405 11/22/21 0749 11/22/21 1123 11/22/21 1310  ?BP: (!) 110/49 (!) 104/47 (!) 115/54 (!) 98/53  ?Pulse: 60 62 62 69  ?Resp: 20 20 20 20   ?Temp: 97.9 ?F (36.6 ?C) 98.5 ?F (36.9 ?C) 98.6 ?F (37 ?C) 98.4 ?F (36.9 ?C)  ?TempSrc: Oral Oral Oral Oral  ?SpO2: 99% 97% 97% 95%  ?Weight:      ?Height:      ? ?General exam: Appears frail, deconditioned, not in any acute distress. ?Respiratory system: CTA bilaterally, no wheezing, no crackles, normal respiratory effort. ?Cardiovascular system: S1-S2 heard, regular rate and rhythm, no murmur. ?Gastrointestinal system:  Abdomen is soft, non tender, non distended, BS+ ?Central nervous system: Alert, oriented x 2, no focal neurological deficits ?Extremities: No edema, no cyanosis, no clubbing. ?Psychiatry: Mood, insight, judgment normal ? ?Data Reviewed: ?I have Reviewed nursing notes, Vitals, and Lab results since pt's last encounter. Pertinent lab results CBC, BMP ?I have ordered test including CBC, BMP ?I have reviewed the last note from nephrologist,  ?I have discussed pt's  care plan and test results with patient.  ? ? ?Disposition: ?Status is: Inpatient ?Remains inpatient appropriate because: ?  ?Admitted for hypotension during hemodialysis.  She has not completed dialysis and was sent in the ED.  blood pressure has improved.  Nephrology is consulted,  Patient had hemodialysis,  tolerated well.  Blood pressure is improved. ? ?Anticipate discharge home in 1 to 2 days. ? ? ? Planned Discharge Destination: Home ? ? ? ? ?Time spent: 35 minutes ? ?Author: ?Shawna Clamp, MD ?11/22/2021 2:25 PM ? ?For on call review [email or web address].  ?

## 2021-11-22 NOTE — Discharge Summary (Addendum)
?Physician Discharge Summary ?  ?Patient: Cassandra Joseph MRN: [id number] DOB: Jan 20, 1946  ?Admit date:     11/19/2021  ?Discharge date: 11/23/21  ?Discharge Physician: Shawna Clamp  ? ?PCP: Tracie Harrier, MD  ? ?Recommendations at discharge:  ?Advised to follow-up with primary care physician in 1 week. ?Advised to follow-up with nephrology for continuation of dialysis ?Advised to continue midodrine 10 mg 3 times daily for hypotension. ?Advised to hold Coreg and amlodipine for hypotension. ? ?Discharge Diagnoses: ?Principal Problem: ?  Hypotension ?Active Problems: ?  Atrial fibrillation with slow ventricular response ([place]) ?  ESRD on hemodialysis ([place]) ?  Multiple myeloma ([place]) ?  Chronic anticoagulation ?  Chronic respiratory failure with hypoxia (HCC) ?  Rheumatoid arthritis ([place]) ?  OSA on CPAP ?  Obesity, Class III, BMI 40-49.9 (morbid obesity) ([place]) ? ?Resolved Problems: ?  * No resolved hospital problems. * ? ?Hospital Course: ?This 76 years old female with PMH significant for ESRD on hemodialysis, class III obesity, OSA on CPAP, chronic hypoxic respiratory failure on 3 L of supplemental oxygen at baseline, paroxysmal A-fib on Eliquis, rheumatoid arthritis, multiple myeloma, chronic hypotension on midodrine who was sent from dialysis center due to low blood pressure.  Dialysis was stopped within 2 hours due to patient having low blood pressure.  Patient was also complaining of a stabbing chest pain and decreased appetite.  On arrival her blood pressure was 77/ 42 with pulse 51.  CTA chest shows no acute aortic findings or acute abnormality in the abdomen and pelvis.  Patient was given IV fluid boluses and midodrine with improvement in her blood pressure.  Nephrology was consulted, Patient successfully had hemodialysis in the hospital 3 days in a row.  Her blood pressure has improved.  Nephrology signed off recommended patient can continue outpatient dialysis as per schedule on Monday Wednesday and Friday  schedule.  Patient is being discharged home. Home and services been arranged. ? ?Assessment and Plan: ?* Hypotension ?Suspect secondary to dehydration from dialysis and poor oral intake. ?No signs of infection or other acute illness to explain hypotension at this time. ?Continue midodrine, Continue IV fluid bolus. ?So far patient has received 2 L of IV fluids.  Feels better and been eating. ?Blood pressure has improved. ? ?Atrial fibrillation with slow ventricular response ([place]) ?Hold carvedilol ?Continue Eliquis ? ?ESRD on hemodialysis ([place]) ?Nephrology consulted for continuation of hemodialysis. ?Patient had completed dialysis x 3. Next HD as per schedule Monday Wednesday Friday ?Her blood pressure has improved. ? ?Obesity, Class III, BMI 40-49.9 (morbid obesity) ([place]) ?Complicating factor to overall prognosis and care ? ?OSA on CPAP ?Continue CPAP nightly ? ?Rheumatoid arthritis ([place]) ?Continue hydroxychloroquine.  Continue hydrocodone as needed ? ?Chronic respiratory failure with hypoxia (HCC) ?Patient is at baseline O2 requirement ? ?Chronic anticoagulation ?Continue Eliquis ? ?Multiple myeloma ([place]) ?Last seen by Dr. Rogue Bussing in May 2022. ? ? ? ?Pain control - [employer] Controlled Substance Reporting System database was reviewed. and patient was instructed, not to drive, operate heavy machinery, perform activities at heights, swimming or participation in water activities or provide baby-sitting services while on Pain, Sleep and Anxiety Medications; until their outpatient Physician has advised to do so again. Also recommended to not to take more than prescribed Pain, Sleep and Anxiety Medications.  ? ?Consultants: Nephrology ? ?Procedures performed: Hemodialysis  x 3 ?Disposition: Home ?Diet recommendation:  ?Discharge Diet Orders (From admission, onward)  ? ?  Start     Ordered  ? 11/22/21 0000  Diet - low sodium heart healthy       ? 11/22/21 1016  ? 11/22/21 0000  Diet Carb Modified       ? 11/22/21  1016  ? ?  ?  ? ?  ? ?Cardiac diet ?DISCHARGE MEDICATION: ?Allergies as of 11/23/2021   ? ?   Reactions  ? Shellfish Allergy Shortness Of Breath  ? Shellfish Allergy Other (See Comments)  ? Unknown reaction  ? ?  ? ?  ?Medication List  ?  ? ?STOP taking these medications   ? ?amLODipine 10 MG tablet ?Commonly known as: NORVASC ?  ?bisacodyl 10 MG suppository ?Commonly known as: DULCOLAX ?  ?carvedilol 25 MG tablet ?Commonly known as: COREG ?  ?HYDROcodone-acetaminophen 5-325 MG tablet ?Commonly known as: NORCO/VICODIN ?  ?hydrocortisone cream 1 % ?  ? ?  ? ?TAKE these medications   ? ?apixaban 2.5 MG Tabs tablet ?Commonly known as: ELIQUIS ?Take 1 tablet (2.5 mg total) by mouth 2 (two) times daily. ?What changed: Another medication with the same name was removed. Continue taking this medication, and follow the directions you see here. ?  ?Aspirin Low Dose 81 MG EC tablet ?Generic drug: aspirin ?Take 81 mg by mouth daily. ?  ?atorvastatin 80 MG tablet ?Commonly known as: LIPITOR ?Take 1 tablet (80 mg total) by mouth daily. ?What changed: Another medication with the same name was removed. Continue taking this medication, and follow the directions you see here. ?  ?b complex-vitamin c-folic acid 0.8 MG Tabs tablet ?Take 1 tablet by mouth daily. ?What changed: Another medication with the same name was removed. Continue taking this medication, and follow the directions you see here. ?  ?cinacalcet 60 MG tablet ?Commonly known as: SENSIPAR ?Take 60 mg by mouth 2 (two) times daily. ?  ?docusate sodium 100 MG capsule ?Commonly known as: COLACE ?Take 100 mg by mouth 2 (two) times daily. ?  ?epoetin alfa 4000 UNIT/ML injection ?Commonly known as: EPOGEN ?Inject 1 mL (4,000 Units total) into the vein Every Tuesday,Thursday,and Saturday with dialysis. ?  ?feeding supplement (NEPRO CARB STEADY) Liqd ?Take 237 mLs by mouth 3 (three) times daily between meals. ?  ?fluticasone 50 MCG/ACT nasal spray ?Commonly known as:  FLONASE ?Place 1-2 sprays into both nostrils daily at 12 noon. ?  ?gabapentin 300 MG capsule ?Commonly known as: NEURONTIN ?Take 300 mg by mouth 3 (three) times daily. ?  ?Hydrocortisone (Perianal) 1 % Crea ?Apply topically. ?  ?hydroxychloroquine 200 MG tablet ?Commonly known as: PLAQUENIL ?Take 200 mg by mouth daily. ?What changed: Another medication with the same name was removed. Continue taking this medication, and follow the directions you see here. ?  ?midodrine 10 MG tablet ?Commonly known as: PROAMATINE ?Take 1 tablet (10 mg total) by mouth 3 (three) times daily with meals. ?What changed: Another medication with the same name was removed. Continue taking this medication, and follow the directions you see here. ?  ?polyethylene glycol 17 g packet ?Commonly known as: MIRALAX / GLYCOLAX ?Take 17 g by mouth 2 (two) times daily. ?  ?senna 8.6 MG tablet ?Commonly known as: SENOKOT ?Take 2 tablets by mouth at bedtime. ?  ?sevelamer carbonate 800 MG tablet ?Commonly known as: RENVELA ?Take 1,600-2,400 mg by mouth 3 (three) times daily. Take 3 tabs with meals and 2 tabs with snacks ?  ? ?  ? ? Follow-up Information   ? ? Tracie Harrier, MD Follow up in 3 day(s).   ?Specialty: Internal Medicine ?Why:  Patient can see the PA Corene Cornea on 4/20 at 12:45pm ?Contact information: ?[address] ?[place] [address] ?[phone] ? ? ?  ?  ? ?  ?  ? ?  ? ?Discharge Exam: ?Filed Weights  ? 11/20/21 1630 11/22/21 1310 11/22/21 1510  ?Weight: 115.6 kg 114.2 kg 114.2 kg  ? ?General exam: Appears comfortable, not in any acute distress.  Deconditioned ?Respiratory system: CTA bilaterally, no wheezing, no crackles, normal respiratory effort. ?Cardiovascular system: S1-S2 heard, regular rate and rhythm, no murmur. ?Gastrointestinal system: Abdomen is soft, nontender, nondistended, BS+. ?Central nervous system: Alert, oriented x 3 , no focal neurological deficits. ?Extremities: No edema, no cyanosis, no  clubbing. ?Psychiatry: Mood, insight, judgment normal ? ? ?Condition at discharge: good ? ?The results of significant diagnostics from this hospitalization (including imaging, microbiology, ancillary and laboratory

## 2021-11-23 NOTE — Progress Notes (Signed)
[facility] ?[place], [place] ?11/23/21 ? ?Subjective:  ? ?LOS: 3 ? ?Patient resting comfortably ?Reports improved appetite ?Denies shortness of breath, improved after dialysis yesterday ?Requesting discharge ? ?  ?BP Readings from Last 1 Encounters:  ?11/23/21 99/62  ? ? ? ?Objective:  ?Vital signs in last 24 hours:  ?Temp:  [97.7 ?F (36.5 ?C)-99 ?F (37.2 ?C)] 98.1 ?F (36.7 ?C) (04/18 0840) ?Pulse Rate:  [51-73] 71 (04/18 1145) ?Resp:  [14-23] 16 (04/18 0458) ?BP: (76-124)/(31-74) 99/62 (04/18 1145) ?SpO2:  [95 %-100 %] 97 % (04/18 1145) ?Weight:  [114.2 kg] 114.2 kg (04/17 1510) ? ?Weight change:  ?Filed Weights  ? 11/20/21 1630 11/22/21 1310 11/22/21 1510  ?Weight: 115.6 kg 114.2 kg 114.2 kg  ? ? ?Intake/Output: ?  ? ?Intake/Output Summary (Last 24 hours) at 11/23/2021 1359 ?Last data filed at 11/22/2021 1830 ?Gross per 24 hour  ?Intake --  ?Output 38 ml  ?Net -38 ml  ? ? ? ? ?Physical Exam: ?General: No acute distress, lying in the bed  ?HEENT Moist oral mucous membranes  ?Pulm/lungs Basilar crackles, [place] O2  ?CVS/Heart A-fib, irregular  ?Abdomen:  Soft, nontender  ?Extremities: No edema  ?Neurologic: Alert, oriented  ?Skin: No acute rashes  ?Access: Right forearm AV fistula  ?   ? ? ?Basic Metabolic Panel: ? ?Recent Labs  ?Lab 11/19/21 ?1629  ?NA 134*  ?K 3.9  ?CL 95*  ?CO2 30  ?GLUCOSE 98  ?BUN 41*  ?CREATININE 5.23*  ?CALCIUM 9.0  ? ? ? ? ?CBC: ?Recent Labs  ?Lab 11/19/21 ?1557 11/20/21 ?8921  ?WBC 4.2 3.9*  ?HGB 11.4* 10.6*  ?HCT 35.8* 32.6*  ?MCV 98.4 96.7  ?PLT 148* 143*  ? ? ? ?  ?Lab Results  ?Component Value Date  ? HEPBSAG NON REACTIVE 11/20/2021  ? HEPBSAB Reactive (A) 11/20/2021  ? ? ? ? ?Microbiology: ? ?Recent Results (from the past 240 hour(s))  ?Blood Culture (routine x 2)     Status: None (Preliminary result)  ? Collection Time: 11/19/21  4:14 PM  ? Specimen: BLOOD  ?Result Value Ref Range Status  ? Specimen Description BLOOD BLOOD LEFT FOREARM  Final  ? Special Requests   Final   ?  BOTTLES DRAWN AEROBIC AND ANAEROBIC Blood Culture adequate volume  ? Culture   Final  ?  NO GROWTH 4 DAYS ?Performed at [facility], [address], [place], [place] [address] ?  ? Report Status PENDING  Incomplete  ?Blood Culture (routine x 2)     Status: None (Preliminary result)  ? Collection Time: 11/19/21  4:19 PM  ? Specimen: BLOOD  ?Result Value Ref Range Status  ? Specimen Description BLOOD BLOOD LEFT HAND  Final  ? Special Requests   Final  ?  BOTTLES DRAWN AEROBIC AND ANAEROBIC Blood Culture adequate volume  ? Culture   Final  ?  NO GROWTH 4 DAYS ?Performed at [facility], [address], [place], [place] [address] ?  ? Report Status PENDING  Incomplete  ?Resp Panel by RT-PCR (Flu A&B, Covid) Nasopharyngeal Swab     Status: None  ? Collection Time: 11/19/21  4:29 PM  ? Specimen: Nasopharyngeal Swab; Nasopharyngeal(NP) swabs in vial transport medium  ?Result Value Ref Range Status  ? SARS Coronavirus 2 by RT PCR NEGATIVE NEGATIVE Final  ?  Comment: (NOTE) ?SARS-CoV-2 target nucleic acids are NOT DETECTED. ? ?The SARS-CoV-2 RNA is generally detectable in upper respiratory ?specimens during the acute phase of infection.  The lowest ?concentration of SARS-CoV-2 viral copies this assay can detect is ?138 copies/mL. A negative result does not preclude SARS-Cov-2 ?infection and should not be used as the sole basis for treatment or ?other patient management decisions. A negative result may occur with  ?improper specimen collection/handling, submission of specimen other ?than nasopharyngeal swab, presence of viral mutation(s) within the ?areas targeted by this assay, and inadequate number of viral ?copies(<138 copies/mL). A negative result must be combined with ?clinical observations, patient history, and epidemiological ?information. The expected result is Negative. ? ?Fact Sheet for Patients:  ?EntrepreneurPulse.com.au ? ?Fact Sheet for Healthcare Providers:   ?IncredibleEmployment.be ? ?This test is no t yet approved or cleared by the [place] FDA and  ?has been authorized for detection and/or diagnosis of SARS-CoV-2 by ?FDA under an Emergency Use Authorization (EUA). This EUA will remain  ?in effect (meaning this test can be used) for the duration of the ?COVID-19 declaration under Section 564(b)(1) of the Act, 21 ?U.S.C.section 360bbb-3(b)(1), unless the authorization is terminated  ?or revoked sooner.  ? ? ?  ? Influenza A by PCR NEGATIVE NEGATIVE Final  ? Influenza B by PCR NEGATIVE NEGATIVE Final  ?  Comment: (NOTE) ?The Xpert Xpress SARS-CoV-2/FLU/RSV plus assay is intended as an aid ?in the diagnosis of influenza from Nasopharyngeal swab specimens and ?should not be used as a sole basis for treatment. Nasal washings and ?aspirates are unacceptable for Xpert Xpress SARS-CoV-2/FLU/RSV ?testing. ? ?Fact Sheet for Patients: ?EntrepreneurPulse.com.au ? ?Fact Sheet for Healthcare Providers: ?IncredibleEmployment.be ? ?This test is not yet approved or cleared by the [place] FDA and ?has been authorized for detection and/or diagnosis of SARS-CoV-2 by ?FDA under an Emergency Use Authorization (EUA). This EUA will remain ?in effect (meaning this test can be used) for the duration of the ?COVID-19 declaration under Section 564(b)(1) of the Act, 21 U.S.C. ?section 360bbb-3(b)(1), unless the authorization is terminated or ?revoked. ? ?Performed at [facility], [place], ?[place] [address] ?  ? ? ?Coagulation Studies: ?No results for input(s): LABPROT, INR in the last 72 hours. ? ? ?Urinalysis: ?No results for input(s): COLORURINE, LABSPEC, [place], GLUCOSEU, HGBUR, BILIRUBINUR, KETONESUR, PROTEINUR, UROBILINOGEN, NITRITE, LEUKOCYTESUR in the last 72 hours. ? ?Invalid input(s): APPERANCEUR  ? ? ?Imaging: ?No results found. ? ? ?Medications:  ? ? ? apixaban  2.5 mg Oral BID  ?  Chlorhexidine Gluconate Cloth  6 each Topical Q0600  ? cinacalcet  60 mg Oral BID WC  ? hydroxychloroquine  200 mg Oral Daily  ? midodrine  10 mg Oral TID WC  ? polyethylene glycol  17 g Oral BID  ? sevelamer carbonate  2,400 mg Oral TID with meals  ? ?acetaminophen **OR** acetaminophen, alum & mag hydroxide-simeth, atropine, ondansetron **OR** ondansetron (ZOFRAN) IV, sevelamer carbonate ? ?Assessment/ Plan:  ?76 y.o. female with  hypertension, anemia, multiple myeloma, chronic respiratory failure on home O2, and end stage renal disease on dialysis  was admitted on 11/19/2021 for ? ?Principal Problem: ?  Hypotension ?Active Problems: ?  ESRD on hemodialysis ([place]) ?  Multiple myeloma ([place]) ?  Chronic anticoagulation ?  Chronic respiratory failure with hypoxia (HCC) ?  Rheumatoid arthritis ([place]) ?  OSA on CPAP ?  Obesity, Class III, BMI 40-49.9 (morbid obesity) ([place]) ?  Atrial fibrillation with slow ventricular response ([place]) ? ?Bradycardia [R00.1] ?ESRD (end stage renal disease) ([place]) [N18.6] ?Hypotension [I95.9] ?Hypotension, unspecified hypotension type [I95.9] ?Chest pain, unspecified type [R07.9] ? ?[facility]/MWF/Rt lower  forearm AVF ? ?#.  Hypotension in the setting of ESRD ?Received dialysis yesterday, UF goal reduced due to hypotension. Dry weight adjustment will occur outpatient. Next treatment scheduled for Wednesday ? ?#. Anemia of CKD ? ?Lab Results  ?Component Value Date  ? HGB 10.6 (L) 11/20/2021  ?Hemoglobin within acceptable range   ? ?#. Secondary hyperparathyroidism of renal origin N 25.81  ? ?   ?Component Value Date/Time  ? PTH 242 (H) 09/29/2020 1119  ? ?Lab Results  ?Component Value Date  ? PHOS 4.2 05/18/2021  ? ?Will continue to monitor bone minerals during this admission. ? ? ?#.  A-fib, bradycardia, hypotension ?Carvedilol on hold ?Management as per hospitalist team ? ? ? LOS: 3 ?[place] ?4/18/20231:59 PM ? ?[facility] ?[place],  [place] ?[phone] ? ?

## 2021-11-23 NOTE — TOC Transition Note (Signed)
Transition of Care (TOC) - CM/SW Discharge Note ? ? ?Patient Details  ?Name: Cassandra Joseph ?MRN: [id number] ?Date of Birth: August 23, 1945 ? ?Transition of Care (TOC) CM/SW Contact:  ?Alberteen Sam, LCSW ?Phone Number: ?11/23/2021, 11:45 AM ? ? ?Clinical Narrative:    ? ?Patient to discharge home today with [facility] services (RN), will transport Via [facility].  ? ?Daughter [name] informed of dc today.  ? ?No further dc needs identified.  ? ? ?Final next level of care: [place] ?Barriers to Discharge: No Barriers Identified ? ? ?Patient Goals and CMS Choice ?Patient states their goals for this hospitalization and ongoing recovery are:: to go home ?CMS Medicare.gov Compare Post Acute Care list provided to:: Patient ?Choice offered to / list presented to : Patient ? ?Discharge Placement ?  ?           ?  ?  ?  ?Patient and family notified of of transfer: 11/23/21 ? ?Discharge Plan and Services ?  ?  ?           ?  ?  ?  ?  ?  ?HH Arranged: RN ?[place] Agency: Microbiologist ([place]) ?Date HH Agency Contacted: 11/23/21 ?Time [place]: 2119 ?Representative spoke with at [place]: Corene Cornea ? ?Social Determinants of Health (SDOH) Interventions ?  ? ? ?Readmission Risk Interventions ? ?  05/21/2021  ? 12:18 PM 08/11/2020  ?  2:01 PM 06/25/2019  ?  3:08 PM  ?Readmission Risk Prevention Plan  ?Transportation Screening Complete Complete Complete  ?PCP or Specialist Appt within 3-5 Days  Complete Complete  ?[place] or Home Care Consult  Complete Complete  ?Social Work Consult for Post Planning/Counseling  Complete Complete  ?Palliative Care Screening  Not Applicable Not Applicable  ?Medication Review Press photographer) Complete Complete Complete  ?PCP or Specialist appointment within 3-5 days of discharge Complete    ?[place] or Home Care Consult Complete    ?SW Recovery Care/Counseling Consult Complete    ?Palliative Care Screening Not Applicable    ?[place] Not Applicable     ? ? ? ? ? ?

## 2021-11-23 NOTE — Care Management Important Message (Signed)
Important Message ? ?Patient Details  ?Name: Cassandra Joseph ?MRN: [id number] ?Date of Birth: February 18, 1946 ? ? ?Medicare Important Message Given:  Yes ? ? ? ? ?Dannette Barbara ?11/23/2021, 11:07 AM ?

## 2021-11-23 NOTE — Progress Notes (Signed)
Patient discharging home. Vital signs stable at time of discharge as reflected in discharge summary. Discharge instructions given to daughter verbal understanding returned. Per daughter patient is having chest pain and needs pain medication sent home with her. Patient reports pain in her legs but not chest pain. Educated patient's daughter that pain mediation could lower patient's BP and tylenol is what we have been giving her. Patient's daughter also informed about patient's BP medications being discontinued due to patients lower blood pressure. No questions at this time. ?

## 2021-11-24 LAB — CULTURE, BLOOD (ROUTINE X 2)
Culture: NO GROWTH
Culture: NO GROWTH
Special Requests: ADEQUATE
Special Requests: ADEQUATE

## 2021-12-21 ENCOUNTER — Other Ambulatory Visit: Payer: Self-pay | Admitting: Internal Medicine

## 2021-12-21 DIAGNOSIS — Z1231 Encounter for screening mammogram for malignant neoplasm of breast: Secondary | ICD-10-CM

## 2022-02-10 ENCOUNTER — Observation Stay: Payer: Medicare Other

## 2022-02-10 ENCOUNTER — Inpatient Hospital Stay
Admission: EM | Admit: 2022-02-10 | Discharge: 2022-02-21 | DRG: 393 | Disposition: A | Discharge status: Home Health | Payer: Medicare Other | Attending: Internal Medicine | Admitting: Internal Medicine

## 2022-02-10 ENCOUNTER — Other Ambulatory Visit: Payer: Self-pay

## 2022-02-10 DIAGNOSIS — Z90710 Acquired absence of both cervix and uterus: Secondary | ICD-10-CM

## 2022-02-10 DIAGNOSIS — Z808 Family history of malignant neoplasm of other organs or systems: Secondary | ICD-10-CM

## 2022-02-10 DIAGNOSIS — C9001 Multiple myeloma in remission: Secondary | ICD-10-CM | POA: Diagnosis present

## 2022-02-10 DIAGNOSIS — Z79899 Other long term (current) drug therapy: Secondary | ICD-10-CM

## 2022-02-10 DIAGNOSIS — K649 Unspecified hemorrhoids: Secondary | ICD-10-CM | POA: Diagnosis not present

## 2022-02-10 DIAGNOSIS — J9621 Acute and chronic respiratory failure with hypoxia: Secondary | ICD-10-CM | POA: Diagnosis present

## 2022-02-10 DIAGNOSIS — Z90722 Acquired absence of ovaries, bilateral: Secondary | ICD-10-CM

## 2022-02-10 DIAGNOSIS — N939 Abnormal uterine and vaginal bleeding, unspecified: Secondary | ICD-10-CM | POA: Diagnosis present

## 2022-02-10 DIAGNOSIS — Z8616 Personal history of COVID-19: Secondary | ICD-10-CM

## 2022-02-10 DIAGNOSIS — N2581 Secondary hyperparathyroidism of renal origin: Secondary | ICD-10-CM | POA: Diagnosis present

## 2022-02-10 DIAGNOSIS — K922 Gastrointestinal hemorrhage, unspecified: Principal | ICD-10-CM

## 2022-02-10 DIAGNOSIS — Z91013 Allergy to seafood: Secondary | ICD-10-CM

## 2022-02-10 DIAGNOSIS — Z87891 Personal history of nicotine dependence: Secondary | ICD-10-CM

## 2022-02-10 DIAGNOSIS — I132 Hypertensive heart and chronic kidney disease with heart failure and with stage 5 chronic kidney disease, or end stage renal disease: Secondary | ICD-10-CM | POA: Diagnosis present

## 2022-02-10 DIAGNOSIS — Z7401 Bed confinement status: Secondary | ICD-10-CM

## 2022-02-10 DIAGNOSIS — I959 Hypotension, unspecified: Secondary | ICD-10-CM | POA: Diagnosis present

## 2022-02-10 DIAGNOSIS — R109 Unspecified abdominal pain: Secondary | ICD-10-CM | POA: Diagnosis present

## 2022-02-10 DIAGNOSIS — K921 Melena: Secondary | ICD-10-CM | POA: Diagnosis present

## 2022-02-10 DIAGNOSIS — I2510 Atherosclerotic heart disease of native coronary artery without angina pectoris: Secondary | ICD-10-CM | POA: Diagnosis present

## 2022-02-10 DIAGNOSIS — M069 Rheumatoid arthritis, unspecified: Secondary | ICD-10-CM | POA: Diagnosis present

## 2022-02-10 DIAGNOSIS — Z7982 Long term (current) use of aspirin: Secondary | ICD-10-CM

## 2022-02-10 DIAGNOSIS — E785 Hyperlipidemia, unspecified: Secondary | ICD-10-CM | POA: Diagnosis present

## 2022-02-10 DIAGNOSIS — K5900 Constipation, unspecified: Secondary | ICD-10-CM | POA: Diagnosis present

## 2022-02-10 DIAGNOSIS — I509 Heart failure, unspecified: Secondary | ICD-10-CM | POA: Diagnosis present

## 2022-02-10 DIAGNOSIS — Z8673 Personal history of transient ischemic attack (TIA), and cerebral infarction without residual deficits: Secondary | ICD-10-CM

## 2022-02-10 DIAGNOSIS — K625 Hemorrhage of anus and rectum: Secondary | ICD-10-CM | POA: Diagnosis present

## 2022-02-10 DIAGNOSIS — R5381 Other malaise: Secondary | ICD-10-CM | POA: Diagnosis present

## 2022-02-10 DIAGNOSIS — I10 Essential (primary) hypertension: Secondary | ICD-10-CM | POA: Diagnosis present

## 2022-02-10 DIAGNOSIS — D649 Anemia, unspecified: Secondary | ICD-10-CM | POA: Diagnosis present

## 2022-02-10 DIAGNOSIS — Z5320 Procedure and treatment not carried out because of patient's decision for unspecified reasons: Secondary | ICD-10-CM | POA: Diagnosis present

## 2022-02-10 DIAGNOSIS — Z9981 Dependence on supplemental oxygen: Secondary | ICD-10-CM

## 2022-02-10 DIAGNOSIS — R042 Hemoptysis: Secondary | ICD-10-CM | POA: Diagnosis present

## 2022-02-10 DIAGNOSIS — G4733 Obstructive sleep apnea (adult) (pediatric): Secondary | ICD-10-CM | POA: Diagnosis present

## 2022-02-10 DIAGNOSIS — Z992 Dependence on renal dialysis: Secondary | ICD-10-CM

## 2022-02-10 DIAGNOSIS — Z9049 Acquired absence of other specified parts of digestive tract: Secondary | ICD-10-CM

## 2022-02-10 DIAGNOSIS — D631 Anemia in chronic kidney disease: Secondary | ICD-10-CM | POA: Diagnosis present

## 2022-02-10 DIAGNOSIS — Z7901 Long term (current) use of anticoagulants: Secondary | ICD-10-CM

## 2022-02-10 DIAGNOSIS — I480 Paroxysmal atrial fibrillation: Secondary | ICD-10-CM | POA: Diagnosis present

## 2022-02-10 DIAGNOSIS — Z8249 Family history of ischemic heart disease and other diseases of the circulatory system: Secondary | ICD-10-CM

## 2022-02-10 DIAGNOSIS — G894 Chronic pain syndrome: Secondary | ICD-10-CM | POA: Diagnosis present

## 2022-02-10 DIAGNOSIS — N186 End stage renal disease: Secondary | ICD-10-CM | POA: Diagnosis present

## 2022-02-10 DIAGNOSIS — E669 Obesity, unspecified: Secondary | ICD-10-CM | POA: Diagnosis present

## 2022-02-10 HISTORY — DX: Chronic kidney disease, unspecified: N18.9

## 2022-02-10 HISTORY — DX: Pain in left leg: M79.605

## 2022-02-10 HISTORY — DX: Personal history of diseases of the blood and blood-forming organs and certain disorders involving the immune mechanism: Z86.2

## 2022-02-10 LAB — COMPREHENSIVE METABOLIC PANEL
ALT: 15 U/L (ref 0–44)
AST: 17 U/L (ref 15–41)
Albumin: 3.6 g/dL (ref 3.5–5.0)
Alkaline Phosphatase: 75 U/L (ref 38–126)
Anion gap: 11 (ref 5–15)
BUN: 29 mg/dL — ABNORMAL HIGH (ref 8–23)
CO2: 31 mmol/L (ref 22–32)
Calcium: 9 mg/dL (ref 8.9–10.3)
Chloride: 94 mmol/L — ABNORMAL LOW (ref 98–111)
Creatinine, Ser: 4.99 mg/dL — ABNORMAL HIGH (ref 0.44–1.00)
GFR, Estimated: 9 mL/min — ABNORMAL LOW (ref >60)
Glucose, Bld: 93 mg/dL (ref 70–99)
Potassium: 4 mmol/L (ref 3.5–5.1)
Sodium: 136 mmol/L (ref 135–145)
Total Bilirubin: 1 mg/dL (ref 0.3–1.2)
Total Protein: 8.1 g/dL (ref 6.5–8.1)

## 2022-02-10 LAB — CBC
HCT: 29.1 % — ABNORMAL LOW (ref 36.0–46.0)
Hemoglobin: 9.1 g/dL — ABNORMAL LOW (ref 12.0–15.0)
MCH: 30.4 pg (ref 26.0–34.0)
MCHC: 31.3 g/dL (ref 30.0–36.0)
MCV: 97.3 fL (ref 80.0–100.0)
Platelets: 232 10*3/uL (ref 150–400)
RBC: 2.99 MIL/uL — ABNORMAL LOW (ref 3.87–5.11)
RDW: 15.1 % (ref 11.5–15.5)
WBC: 6.7 10*3/uL (ref 4.0–10.5)
nRBC: 0 % (ref 0.0–0.2)

## 2022-02-10 LAB — TYPE AND SCREEN
ABO/RH(D): O POS
Antibody Screen: NEGATIVE

## 2022-02-10 LAB — LACTIC ACID, PLASMA: Lactic Acid, Venous: 1.2 mmol/L (ref 0.5–1.9)

## 2022-02-10 MED ORDER — ACETAMINOPHEN 325 MG PO TABS
650.0000 mg | ORAL_TABLET | Freq: Four times a day (QID) | ORAL | Status: DC | PRN
  Administered 2022-02-13: 650 mg via ORAL
  Filled 2022-02-10: qty 2

## 2022-02-10 MED ORDER — ONDANSETRON HCL 4 MG/2ML IJ SOLN
4.0000 mg | Freq: Four times a day (QID) | INTRAMUSCULAR | Status: DC | PRN
  Administered 2022-02-11 – 2022-02-13 (×2): 4 mg via INTRAVENOUS
  Filled 2022-02-10 (×2): qty 2

## 2022-02-10 MED ORDER — ONDANSETRON HCL 4 MG PO TABS: 4.0000 mg | ORAL_TABLET | Freq: Four times a day (QID) | ORAL | Status: DC | PRN

## 2022-02-10 MED ORDER — SEVELAMER CARBONATE 800 MG PO TABS
2400.0000 mg | ORAL_TABLET | Freq: Three times a day (TID) | ORAL | Status: DC
  Administered 2022-02-11 – 2022-02-21 (×25): 2400 mg via ORAL
  Filled 2022-02-10 (×26): qty 3

## 2022-02-10 MED ORDER — HYDRALAZINE HCL 20 MG/ML IJ SOLN: 10.0000 mg | INTRAMUSCULAR | Status: DC | PRN

## 2022-02-10 MED ORDER — MIDODRINE HCL 5 MG PO TABS
10.0000 mg | ORAL_TABLET | Freq: Once | ORAL | Status: AC
Start: 2022-02-10 — End: 2022-02-10
  Administered 2022-02-10: 10 mg via ORAL
  Filled 2022-02-10: qty 2

## 2022-02-10 MED ORDER — IOHEXOL 350 MG/ML SOLN
75.0000 mL | Freq: Once | INTRAVENOUS | Status: AC | PRN
Start: 2022-02-10 — End: 2022-02-10
  Administered 2022-02-10: 75 mL via INTRAVENOUS

## 2022-02-10 MED ORDER — AMLODIPINE BESYLATE 5 MG PO TABS
5.0000 mg | ORAL_TABLET | Freq: Every day | ORAL | Status: DC
  Administered 2022-02-11 – 2022-02-15 (×5): 5 mg via ORAL
  Filled 2022-02-10 (×5): qty 1

## 2022-02-10 MED ORDER — GABAPENTIN 300 MG PO CAPS
300.0000 mg | ORAL_CAPSULE | Freq: Three times a day (TID) | ORAL | Status: DC
  Administered 2022-02-10 – 2022-02-21 (×30): 300 mg via ORAL
  Filled 2022-02-10 (×30): qty 1

## 2022-02-10 MED ORDER — MIDODRINE HCL 5 MG PO TABS
10.0000 mg | ORAL_TABLET | Freq: Three times a day (TID) | ORAL | Status: DC
  Administered 2022-02-11 – 2022-02-13 (×8): 10 mg via ORAL
  Filled 2022-02-10 (×8): qty 2

## 2022-02-10 MED ORDER — FLUTICASONE PROPIONATE 50 MCG/ACT NA SUSP
2.0000 | Freq: Every day | NASAL | Status: DC
  Administered 2022-02-11 – 2022-02-18 (×7): 2 via NASAL
  Filled 2022-02-10 (×3): qty 16

## 2022-02-10 MED ORDER — HYDROXYCHLOROQUINE SULFATE 200 MG PO TABS
200.0000 mg | ORAL_TABLET | Freq: Every day | ORAL | Status: DC
  Administered 2022-02-11 – 2022-02-21 (×10): 200 mg via ORAL
  Filled 2022-02-10 (×10): qty 1

## 2022-02-10 MED ORDER — MELATONIN 5 MG PO TABS
5.0000 mg | ORAL_TABLET | Freq: Once | ORAL | Status: AC
  Administered 2022-02-10: 5 mg via ORAL
  Filled 2022-02-10: qty 1

## 2022-02-10 MED ORDER — ATORVASTATIN CALCIUM 20 MG PO TABS
80.0000 mg | ORAL_TABLET | Freq: Every day | ORAL | Status: DC
Start: 2022-02-10 — End: 2022-02-22
  Administered 2022-02-10 – 2022-02-20 (×11): 80 mg via ORAL
  Filled 2022-02-10 (×11): qty 4

## 2022-02-10 MED ORDER — PANTOPRAZOLE SODIUM 40 MG IV SOLR
40.0000 mg | Freq: Once | INTRAVENOUS | Status: AC
  Administered 2022-02-10: 40 mg via INTRAVENOUS
  Filled 2022-02-10: qty 10

## 2022-02-10 MED ORDER — MORPHINE SULFATE (PF) 2 MG/ML IV SOLN: 2.0000 mg | INTRAVENOUS | Status: DC | PRN

## 2022-02-10 MED ORDER — ACETAMINOPHEN 650 MG RE SUPP: 650.0000 mg | Freq: Four times a day (QID) | RECTAL | Status: DC | PRN

## 2022-02-10 MED ORDER — ASPIRIN 81 MG PO TBEC
81.0000 mg | DELAYED_RELEASE_TABLET | Freq: Every day | ORAL | Status: DC
  Administered 2022-02-11 – 2022-02-21 (×10): 81 mg via ORAL
  Filled 2022-02-10 (×10): qty 1

## 2022-02-10 NOTE — Assessment & Plan Note (Signed)
Blood pressure (!) 145/48, pulse 76, temperature 98.3 F (36.8 C), temperature source Oral, resp. rate (!) 22, height '5\' 6"'$  (1.676 m), weight 117.9 kg, SpO2 95 %. we will continue PRN hydralazine and amlodipine.

## 2022-02-10 NOTE — Assessment & Plan Note (Addendum)
Generalized abdominal pain.  Normal wbc count.lipase/ lactic pending Pt has atherosclerosis per ct abd in April 2023 and we will get ct abs and pelvis today with contrast. IV PPI. PRN morphine .

## 2022-02-10 NOTE — ED Provider Triage Note (Signed)
Emergency Medicine Provider Triage Evaluation Note  Cassandra Joseph , a 76 y.o. female  was evaluated in triage.  Pt complains of rectal bleeding.  Patient states she has had bleeding over the last few days.  Started as dark red and now is bright red..  Review of Systems  Positive: GI bleed Negative: Marland Kitchen  Physical Exam  There were no vitals taken for this visit. Gen:   Awake, no distress   Resp:  Normal effort  MSK:   Moves extremities without difficulty  Other:    Medical Decision Making  Medically screening exam initiated at 3:39 PM.  Appropriate orders placed.  [name] was informed that the remainder of the evaluation will be completed by another provider, this initial triage assessment does not replace that evaluation, and the importance of remaining in the ED until their evaluation is complete.  Protocols initiated by nursing staff   Versie Starks, PA-C 02/10/22 1539

## 2022-02-10 NOTE — Assessment & Plan Note (Addendum)
Pt in a.fib on auscultation.    Eliquis held due to anemia and gib.  Pt started  on heparin gtt.

## 2022-02-10 NOTE — ED Notes (Signed)
Pt is alert and oriented, speaking in full sentences, breathing easy and unlabored. Warm blanket provided for comfort, call bell at the bedside, advise pt to call for help.

## 2022-02-10 NOTE — ED Triage Notes (Signed)
Pt comes into the ED via EMS from home with c/o dark red blood in stools for the past 4 days , today states she is spitting up blood. Having constipation.   Dialysis yesterday CBG138 97.8 125/64 HR80 99% on 4L [place] all the time

## 2022-02-10 NOTE — Assessment & Plan Note (Signed)
cpap per home settings.  

## 2022-02-10 NOTE — Assessment & Plan Note (Addendum)
Patient presenting with bright red blood rectal bleeding that has been going on for the past 4 to 5 days. Blood has been noticed in the diaper daughter has been changing her bleeding has been intervention.    Latest Ref Rng & Units 02/10/2022    2:53 PM 11/20/2021    5:06 AM 11/19/2021    3:57 PM  CBC  WBC 4.0 - 10.5 K/uL 6.7  3.9  4.2   Hemoglobin 12.0 - 15.0 g/dL 9.1  10.6  11.4   Hematocrit 36.0 - 46.0 % 29.1  32.6  35.8   Platelets 150 - 400 K/uL 232  143  148   Hemoglobin currently stable at 9.1. We will monitor with H&H in the next 4 hours x 2. Type and cross transfuse as needed. GI consult has been placed-[email or web address].

## 2022-02-10 NOTE — Assessment & Plan Note (Signed)
05/18/21 12:25 05/19/21 07:25 11/19/21 15:57 11/20/21 05:06 02/10/22 14:53  RBC 2.92 (L) 2.62 (L) 3.64 (L) 3.37 (L) 2.99 (L)  Hemoglobin 9.6 (L) 8.4 (L) 11.4 (L) 10.6 (L) 9.1 (L)  HCT 29.2 (L) 26.7 (L) 35.8 (L) 32.6 (L) 29.1 (L)  MCV 100.0 101.9 (H) 98.4 96.7 97.3  MCH 32.9 32.1 31.3 31.5 30.4  MCHC 32.9 31.5 31.8 32.5 31.3  RDW 16.6 (H) 16.7 (H) 15.9 (H) 15.9 (H) 15.1  Platelets 174 144 (L) 148 (L) 143 (L) 232  Pt has been chronically anemic since several years and does not have GI.  Suspect combination of IDA/ACD and chronic blood loss.  IV PPI therpay.

## 2022-02-10 NOTE — ED Notes (Signed)
Received report from [facility].

## 2022-02-10 NOTE — H&P (Signed)
History and Physical    Patient: Cassandra Joseph DOB: [birth year] DOA: 02/10/2022 DOS: the patient was seen and examined on 02/10/2022 PCP: Tracie Harrier, MD  Patient coming from: Home  Chief Complaint:  Chief Complaint  Patient presents with   GI Bleeding   HPI: Cassandra Joseph is a 76 y.o. female with medical history significant of Htn, Anemia, APF, ESRD on HD coming for bleeding. Pt states she was having rectal bleeding. Her daughter has been seeing blood in the diaper. Blood is red in color.  Pt has been having stomach pain, like this morning and yesterday morning.  Bleeding started 4 days ago and it has been dark brown and now red.  Stomach pain started same time as bleeding.  Abdominal pain: Duration: 4 days.  Frequency: intermittent  Location: generalized abdomen. Quality: sharp  Rate: 9/10. Radiation: moves around in her stomach and sometimes does to the bottom.  Aggravating:N/A. Alleviating: pain medicine - oxycodone.  Associated factors: +nausea  Pt also has been having vomiting and coughing up blood, since about 5 days as well. Pt states she is bed bound and her daughter takes care of her.  Review of Systems  Gastrointestinal:  Positive for abdominal pain, blood in stool and melena.  All other systems reviewed and are negative.   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 on HD x 11 years; b. felt to be 2/2 HTN; c. HD TTS   History of anemia due to chronic kidney disease    History of nuclear stress test    a. 08/2015: probably normal myocardial perfusion study, no evidence for significant ischemia or scar was noted, during stress, global systolic function normal, EF 54%, RV borderline dilatation, coronary artery calcifications and marked mitral annular calcifications were noted, liver  calcifications were noted on attenuation CT scan likely represent sequelae of previous granulomatous disease   HLD (hyperlipidemia)    Hypertension    Ischemic stroke ([place])    a. 03/2014   Left leg pain    Metabolic syndrome    Morbid obesity ([place])    Multiple myeloma ([place])    a. in remission   Osteomyelitis ([place])    a. s/p left 5th toe amputation 08/2015   Pneumonia due to COVID-19 virus 08/07/2020   Renal disorder    Rheumatoid arthritis ([place])    Septic arthritis ([place]) 09/27/2020   TIA (transient ischemic attack)    a. 2014   Viral pneumonia 08/08/2020   Past Surgical History:  Procedure Laterality Date   ABDOMINAL HYSTERECTOMY     APPENDECTOMY     eyelid surgery     HERNIA REPAIR     right knee surgery     Social History:  reports that she has quit smoking. Her smoking use included cigarettes. She has never used smokeless tobacco. She reports that she does not drink alcohol and does not use drugs.  Allergies  Allergen Reactions   Shellfish Allergy Shortness Of Breath   Shellfish Allergy Other (See Comments)    Unknown reaction    Family History  Problem Relation Age of Onset   Seizures Mother    Hypertension Mother    Throat cancer Father    Breast cancer Neg Hx     Prior to Admission medications  Medication Sig Start Date End Date Taking? Authorizing Provider  apixaban (ELIQUIS) 2.5 MG TABS tablet Take 1 tablet (2.5 mg total) by mouth 2 (two) times daily. 06/28/19   Lavina Hamman, MD  ASPIRIN LOW DOSE 81 MG EC tablet Take 81 mg by mouth daily. 12/26/20   [provider]  atorvastatin (LIPITOR) 80 MG tablet Take 1 tablet (80 mg total) by mouth daily. 06/29/19   Lavina Hamman, MD  b complex-vitamin c-folic acid (NEPHRO-VITE) 0.8 MG TABS tablet Take 1 tablet by mouth daily. 09/27/16   [provider]  cinacalcet (SENSIPAR) 60 MG tablet Take 60 mg by mouth 2 (two) times daily.    [provider]  docusate sodium (COLACE) 100 MG capsule Take  100 mg by mouth 2 (two) times daily. 03/08/21   [provider]  epoetin alfa (EPOGEN) 4000 UNIT/ML injection Inject 1 mL (4,000 Units total) into the vein Every Tuesday,Thursday,and Saturday with dialysis. 10/05/20   Cherene Altes, MD  fluticasone (FLONASE) 50 MCG/ACT nasal spray Place 1-2 sprays into both nostrils daily at 12 noon. 12/26/20   [provider]  gabapentin (NEURONTIN) 300 MG capsule Take 300 mg by mouth 3 (three) times daily. 07/27/20   [provider]  Hydrocortisone, Perianal, 1 % CREA Apply topically. 11/24/20   [provider]  hydroxychloroquine (PLAQUENIL) 200 MG tablet Take 200 mg by mouth daily. 06/30/20   [provider]  midodrine (PROAMATINE) 10 MG tablet Take 1 tablet (10 mg total) by mouth 3 (three) times daily with meals. 06/28/19   Lavina Hamman, MD  Nutritional Supplements (FEEDING SUPPLEMENT, NEPRO CARB STEADY,) LIQD Take 237 mLs by mouth 3 (three) times daily between meals. 05/01/21   Ezekiel Slocumb, DO  polyethylene glycol (MIRALAX / GLYCOLAX) 17 g packet Take 17 g by mouth 2 (two) times daily. 10/05/20   Cherene Altes, MD  senna (SENOKOT) 8.6 MG tablet Take 2 tablets by mouth at bedtime. 11/27/20   [provider]  sevelamer carbonate (RENVELA) 800 MG tablet Take 1,600-2,400 mg by mouth 3 (three) times daily. Take 3 tabs with meals and 2 tabs with snacks 03/16/21   [provider]    Physical Exam: Vitals:   02/10/22 1730 02/10/22 1800 02/10/22 1900 02/10/22 1930  BP: (!) 140/52 (!) 107/51 (!) 142/82 (!) 145/48  Pulse: 71 69 75 76  Resp: 16 (!) 23 (!) 24 (!) 22  Temp:      TempSrc:      SpO2: 98% 95% 98% 95%  Weight:      Height:       Physical Exam Vitals and nursing note reviewed.  Constitutional:      General: She is not in acute distress.    Appearance: Normal appearance. She is obese. She is not ill-appearing, toxic-appearing or diaphoretic.  HENT:     Head: Normocephalic and  atraumatic.     Right Ear: Hearing and external ear normal.     Left Ear: Hearing and external ear normal.     Nose: Nose normal. No nasal deformity.     Mouth/Throat:     Lips: Pink.     Mouth: Mucous membranes are moist.     Tongue: No lesions.     Pharynx: Oropharynx is clear.  Eyes:     Extraocular Movements: Extraocular movements intact.     Pupils: Pupils are equal, round, and reactive to light.  Cardiovascular:     Rate and Rhythm: Normal rate  and regular rhythm.     Pulses: Normal pulses.     Heart sounds: Normal heart sounds.  Pulmonary:     Effort: Pulmonary effort is normal.     Breath sounds: Normal breath sounds.  Abdominal:     General: Bowel sounds are normal. There is no distension.     Palpations: Abdomen is soft. There is no mass.     Tenderness: There is no abdominal tenderness. There is no guarding.     Hernia: No hernia is present.  Musculoskeletal:     Right lower leg: Edema present.     Left lower leg: Edema present.  Skin:    General: Skin is warm.  Neurological:     General: No focal deficit present.     Mental Status: She is alert and oriented to person, place, and time.     Cranial Nerves: Cranial nerves 2-12 are intact.     Motor: Motor function is intact.  Psychiatric:        Attention and Perception: Attention normal.        Mood and Affect: Mood normal.        Speech: Speech normal.        Behavior: Behavior normal. Behavior is cooperative.        Cognition and Memory: Cognition normal.     Data Reviewed: Results for orders placed or performed during the hospital encounter of 02/10/22 (from the past 24 hour(s))  Comprehensive metabolic panel     Status: Abnormal   Collection Time: 02/10/22  2:53 PM  Result Value Ref Range   Sodium 136 135 - 145 mmol/L   Potassium 4.0 3.5 - 5.1 mmol/L   Chloride 94 (L) 98 - 111 mmol/L   CO2 31 22 - 32 mmol/L   Glucose, Bld 93 70 - 99 mg/dL   BUN 29 (H) 8 - 23 mg/dL   Creatinine, Ser 4.99 (H) 0.44 -  1.00 mg/dL   Calcium 9.0 8.9 - 10.3 mg/dL   Total Protein 8.1 6.5 - 8.1 g/dL   Albumin 3.6 3.5 - 5.0 g/dL   AST 17 15 - 41 U/L   ALT 15 0 - 44 U/L   Alkaline Phosphatase 75 38 - 126 U/L   Total Bilirubin 1.0 0.3 - 1.2 mg/dL   GFR, Estimated 9 (L) >60 mL/min   Anion gap 11 5 - 15  CBC     Status: Abnormal   Collection Time: 02/10/22  2:53 PM  Result Value Ref Range   WBC 6.7 4.0 - 10.5 K/uL   RBC 2.99 (L) 3.87 - 5.11 MIL/uL   Hemoglobin 9.1 (L) 12.0 - 15.0 g/dL   HCT 29.1 (L) 36.0 - 46.0 %   MCV 97.3 80.0 - 100.0 fL   MCH 30.4 26.0 - 34.0 pg   MCHC 31.3 30.0 - 36.0 g/dL   RDW 15.1 11.5 - 15.5 %   Platelets 232 150 - 400 K/uL   nRBC 0.0 0.0 - 0.2 %  Type and screen [place]     Status: None   Collection Time: 02/10/22  2:53 PM  Result Value Ref Range   ABO/RH(D) O POS    Antibody Screen NEG    Sample Expiration      02/13/2022,2359 Performed at [facility], [address] [place], [place] [address]      Assessment and Plan: * Melena Patient presenting with bright red blood rectal bleeding that has been going on for the past  4 to 5 days. Blood has been noticed in the diaper daughter has been changing her bleeding has been intervention.    Latest Ref Rng & Units 02/10/2022    2:53 PM 11/20/2021    5:06 AM 11/19/2021    3:57 PM  CBC  WBC 4.0 - 10.5 K/uL 6.7  3.9  4.2   Hemoglobin 12.0 - 15.0 g/dL 9.1  10.6  11.4   Hematocrit 36.0 - 46.0 % 29.1  32.6  35.8   Platelets 150 - 400 K/uL 232  143  148   Hemoglobin currently stable at 9.1. We will monitor with H&H in the next 4 hours x 2. Type and cross transfuse as needed. GI consult has been placed-[email or web address].    Abdominal pain Generalized abdominal pain.  Normal wbc count.lipase/ lactic pending Pt has atherosclerosis per ct abd in April 2023 and we will get ct abs and pelvis today with contrast. IV PPI. PRN morphine .    Anemia  05/18/21 12:25 05/19/21 07:25 11/19/21 15:57  11/20/21 05:06 02/10/22 14:53  RBC 2.92 (L) 2.62 (L) 3.64 (L) 3.37 (L) 2.99 (L)  Hemoglobin 9.6 (L) 8.4 (L) 11.4 (L) 10.6 (L) 9.1 (L)  HCT 29.2 (L) 26.7 (L) 35.8 (L) 32.6 (L) 29.1 (L)  MCV 100.0 101.9 (H) 98.4 96.7 97.3  MCH 32.9 32.1 31.3 31.5 30.4  MCHC 32.9 31.5 31.8 32.5 31.3  RDW 16.6 (H) 16.7 (H) 15.9 (H) 15.9 (H) 15.1  Platelets 174 144 (L) 148 (L) 143 (L) 232  Pt has been chronically anemic since several years and does not have GI.  Suspect combination of IDA/ACD and chronic blood loss.  IV PPI therpay.   Essential hypertension Blood pressure (!) 145/48, pulse 76, temperature 98.3 F (36.8 C), temperature source Oral, resp. rate (!) 22, height 5' 6"  (1.676 m), weight 117.9 kg, SpO2 95 %. we will continue PRN hydralazine and amlodipine.   ESRD on hemodialysis [facility]) Nephrology consultsd. Resume home meds for HD.    PAF (paroxysmal atrial fibrillation) (HCC) Pt in a.fib on auscultation.  Eliquis held due to anemia and gib.  Pt started  on heparin gtt.   OSA on CPAP cpap per home settings.       Advance Care Planning:    Code Status: Full Code   Consults:  Gi consult :  Dr.Locklear.   Family Communication:  Nicola Police (Daughter)  [phone] (Mobile)  Severity of Illness: The appropriate patient status for this patient is OBSERVATION. Observation status is judged to be reasonable and necessary in order to provide the required intensity of service to ensure the patient's safety. The patient's presenting symptoms, physical exam findings, and initial radiographic and laboratory data in the context of their medical condition is felt to place them at decreased risk for further clinical deterioration. Furthermore, it is anticipated that the patient will be medically stable for discharge from the hospital within 2 midnights of admission.   Author: Para Skeans, MD 02/10/2022 10:20 PM  For on call review [email or web address].

## 2022-02-10 NOTE — ED Provider Notes (Signed)
[facility] Provider Note    Event Date/Time   First MD Initiated Contact with Patient 02/10/22 1710     (approximate)   History   Chief Complaint GI Bleeding   HPI  Cassandra Joseph is a 76 y.o. female with past medical history of ESRD on HD (MWF), atrial fibrillation on Eliquis, rheumatoid arthritis, multiple myeloma, chronic hypotension on midodrine, OSA, and chronic hypoxic respiratory failure on 3 L nasal cannula who presents to the ED complaining of GI bleeding.  Patient reports that for the past 3 to 4 days she has noticed dark black and tarry stools, then over the past 24 hours she has noticed bright red blood with her bowel movements.  She denies any pain in her abdomen or rectal area, states that her last dose of Eliquis was yesterday evening.  She has not had any nausea or vomiting, states she has been eating and drinking well up until yesterday, when she states her appetite was decreased.  She denies any history of similar GI bleeding.  She states she has received dialysis as usual with her last treatment being yesterday.     Physical Exam   Triage Vital Signs: ED Triage Vitals  Enc Vitals Group     BP 02/10/22 1544 113/65     Pulse Rate 02/10/22 1542 69     Resp 02/10/22 1544 20     Temp 02/10/22 1542 98.3 F (36.8 C)     Temp Source 02/10/22 1542 Oral     SpO2 02/10/22 1542 98 %     Weight 02/10/22 1542 260 lb (117.9 kg)     Height 02/10/22 1542 5' 6"  (1.676 m)     Head Circumference --      Peak Flow --      Pain Score --      Pain Loc --      Pain Edu? --      Excl. in [place]? --     Most recent vital signs: Vitals:   02/10/22 1730 02/10/22 1800  BP: (!) 140/52 (!) 107/51  Pulse: 71 69  Resp: 16 (!) 23  Temp:    SpO2: 98% 95%    Constitutional: Alert and oriented. Eyes: Conjunctivae are normal. Head: Atraumatic. Nose: No congestion/rhinnorhea. Mouth/Throat: Mucous membranes are moist.  Cardiovascular: Normal rate, regular  rhythm. Grossly normal heart sounds.  2+ radial pulses bilaterally.  Right upper extremity AV fistula with palpable thrill. Respiratory: Normal respiratory effort.  No retractions. Lungs CTAB. Gastrointestinal: Soft and nontender. No distention.  Rectal exam with dark guaiac positive stool. Musculoskeletal: No lower extremity tenderness nor edema.  Neurologic:  Normal speech and language. No gross focal neurologic deficits are appreciated.    ED Results / Procedures / Treatments   Labs (all labs ordered are listed, but only abnormal results are displayed) Labs Reviewed  COMPREHENSIVE METABOLIC PANEL - Abnormal; Notable for the following components:      Result Value   Chloride 94 (*)    BUN 29 (*)    Creatinine, Ser 4.99 (*)    GFR, Estimated 9 (*)    All other components within normal limits  CBC - Abnormal; Notable for the following components:   RBC 2.99 (*)    Hemoglobin 9.1 (*)    HCT 29.1 (*)    All other components within normal limits  POC OCCULT BLOOD, ED  TYPE AND SCREEN    PROCEDURES:  Critical Care performed: No  Procedures  MEDICATIONS ORDERED IN ED: Medications  midodrine (PROAMATINE) tablet 10 mg (has no administration in time range)  pantoprazole (PROTONIX) injection 40 mg (has no administration in time range)     IMPRESSION / MDM / ASSESSMENT AND PLAN / ED COURSE  I reviewed the triage vital signs and the nursing notes.                              76 y.o. female with past medical history of ESRD on HD (MWF), chronic hypoxic respiratory failure on 3 L nasal cannula, atrial fibrillation on Eliquis, rheumatoid arthritis, multiple myeloma, chronic hypotension on midodrine, and OSA who presents to the ED for dark stools for the past 3 days and bright red blood per rectum for the past 24 hours.  Patient's presentation is most consistent with acute presentation with potential threat to life or bodily function.  Differential diagnosis includes, but is  not limited to, lower GI bleed, upper GI bleed, rectal bleeding, anemia, electrolyte abnormality.  Patient nontoxic-appearing and in no acute distress, vital signs are unremarkable and she has a benign abdominal exam.  Patient does have guaiac positive stool and presentation concerning for lower GI bleed.  We will give dose of IV Protonix, hold off on reversal of Eliquis given she is stable at this time.  Hemoglobin slightly downtrending, remainder of labs are reassuring and consistent with known ESRD without acute electrolyte abnormality.  LFTs are within normal limits.  Case discussed with hospitalist for admission.      FINAL CLINICAL IMPRESSION(S) / ED DIAGNOSES   Final diagnoses:  Gastrointestinal hemorrhage, unspecified gastrointestinal hemorrhage type  Anticoagulated     Rx / DC Orders   ED Discharge Orders     None        Note:  This document was prepared using Dragon voice recognition software and may include unintentional dictation errors.   Blake Divine, MD 02/10/22 [phone]

## 2022-02-10 NOTE — ED Triage Notes (Signed)
Pt to ED via [facility] from home. Pt reports dark tarry stools x4 days and bright red blood noted from rectum today. Pt states she has also been spitting up blood and c/o abdominal pain and constipation. Pt on 4L [place] chronically. Pt on blood thinner and hx afib.

## 2022-02-10 NOTE — Hospital Course (Signed)
GIB BLACK STOOL WITH BRB ON ELIQUIS.  GUAIAC POSITIVE.  HB AT 9.1 Gi CONSULT.

## 2022-02-10 NOTE — Assessment & Plan Note (Signed)
Nephrology consultsd. Resume home meds for HD.

## 2022-02-11 ENCOUNTER — Encounter: Payer: Self-pay | Admitting: Internal Medicine

## 2022-02-11 DIAGNOSIS — N186 End stage renal disease: Secondary | ICD-10-CM | POA: Diagnosis present

## 2022-02-11 DIAGNOSIS — E785 Hyperlipidemia, unspecified: Secondary | ICD-10-CM | POA: Diagnosis present

## 2022-02-11 DIAGNOSIS — Z8616 Personal history of COVID-19: Secondary | ICD-10-CM | POA: Diagnosis not present

## 2022-02-11 DIAGNOSIS — K625 Hemorrhage of anus and rectum: Secondary | ICD-10-CM | POA: Diagnosis present

## 2022-02-11 DIAGNOSIS — R042 Hemoptysis: Secondary | ICD-10-CM | POA: Diagnosis present

## 2022-02-11 DIAGNOSIS — E669 Obesity, unspecified: Secondary | ICD-10-CM | POA: Diagnosis present

## 2022-02-11 DIAGNOSIS — I509 Heart failure, unspecified: Secondary | ICD-10-CM | POA: Diagnosis present

## 2022-02-11 DIAGNOSIS — Z992 Dependence on renal dialysis: Secondary | ICD-10-CM | POA: Diagnosis not present

## 2022-02-11 DIAGNOSIS — G4733 Obstructive sleep apnea (adult) (pediatric): Secondary | ICD-10-CM | POA: Diagnosis present

## 2022-02-11 DIAGNOSIS — Z91013 Allergy to seafood: Secondary | ICD-10-CM | POA: Diagnosis not present

## 2022-02-11 DIAGNOSIS — R0603 Acute respiratory distress: Secondary | ICD-10-CM | POA: Diagnosis not present

## 2022-02-11 DIAGNOSIS — K921 Melena: Secondary | ICD-10-CM | POA: Diagnosis present

## 2022-02-11 DIAGNOSIS — I2510 Atherosclerotic heart disease of native coronary artery without angina pectoris: Secondary | ICD-10-CM | POA: Diagnosis present

## 2022-02-11 DIAGNOSIS — I10 Essential (primary) hypertension: Secondary | ICD-10-CM | POA: Diagnosis not present

## 2022-02-11 DIAGNOSIS — C9001 Multiple myeloma in remission: Secondary | ICD-10-CM | POA: Diagnosis present

## 2022-02-11 DIAGNOSIS — Z87891 Personal history of nicotine dependence: Secondary | ICD-10-CM | POA: Diagnosis not present

## 2022-02-11 DIAGNOSIS — M069 Rheumatoid arthritis, unspecified: Secondary | ICD-10-CM | POA: Diagnosis present

## 2022-02-11 DIAGNOSIS — D631 Anemia in chronic kidney disease: Secondary | ICD-10-CM | POA: Diagnosis present

## 2022-02-11 DIAGNOSIS — J9621 Acute and chronic respiratory failure with hypoxia: Secondary | ICD-10-CM | POA: Diagnosis present

## 2022-02-11 DIAGNOSIS — Z90710 Acquired absence of both cervix and uterus: Secondary | ICD-10-CM | POA: Diagnosis not present

## 2022-02-11 DIAGNOSIS — D649 Anemia, unspecified: Secondary | ICD-10-CM | POA: Diagnosis not present

## 2022-02-11 DIAGNOSIS — R109 Unspecified abdominal pain: Secondary | ICD-10-CM | POA: Diagnosis not present

## 2022-02-11 DIAGNOSIS — Z8673 Personal history of transient ischemic attack (TIA), and cerebral infarction without residual deficits: Secondary | ICD-10-CM | POA: Diagnosis not present

## 2022-02-11 DIAGNOSIS — Z7401 Bed confinement status: Secondary | ICD-10-CM | POA: Diagnosis not present

## 2022-02-11 DIAGNOSIS — N2581 Secondary hyperparathyroidism of renal origin: Secondary | ICD-10-CM | POA: Diagnosis present

## 2022-02-11 DIAGNOSIS — G894 Chronic pain syndrome: Secondary | ICD-10-CM | POA: Diagnosis present

## 2022-02-11 DIAGNOSIS — I480 Paroxysmal atrial fibrillation: Secondary | ICD-10-CM | POA: Diagnosis present

## 2022-02-11 DIAGNOSIS — I959 Hypotension, unspecified: Secondary | ICD-10-CM | POA: Diagnosis present

## 2022-02-11 DIAGNOSIS — I4891 Unspecified atrial fibrillation: Secondary | ICD-10-CM | POA: Diagnosis not present

## 2022-02-11 DIAGNOSIS — K649 Unspecified hemorrhoids: Secondary | ICD-10-CM | POA: Diagnosis present

## 2022-02-11 DIAGNOSIS — I132 Hypertensive heart and chronic kidney disease with heart failure and with stage 5 chronic kidney disease, or end stage renal disease: Secondary | ICD-10-CM | POA: Diagnosis present

## 2022-02-11 LAB — HEPATITIS B SURFACE ANTIBODY,QUALITATIVE: Hep B S Ab: REACTIVE — AB

## 2022-02-11 LAB — MRSA NEXT GEN BY PCR, NASAL: MRSA by PCR Next Gen: NOT DETECTED

## 2022-02-11 LAB — HEPATITIS B SURFACE ANTIGEN: Hepatitis B Surface Ag: NONREACTIVE

## 2022-02-11 LAB — LACTIC ACID, PLASMA: Lactic Acid, Venous: 0.8 mmol/L (ref 0.5–1.9)

## 2022-02-11 LAB — HEPATITIS C ANTIBODY: HCV Ab: NONREACTIVE

## 2022-02-11 LAB — LIPASE, BLOOD: Lipase: 36 U/L (ref 11–51)

## 2022-02-11 LAB — HEPATITIS B CORE ANTIBODY, TOTAL: Hep B Core Total Ab: NONREACTIVE

## 2022-02-11 MED ORDER — LIDOCAINE-PRILOCAINE 2.5-2.5 % EX CREA
1.0000 | TOPICAL_CREAM | CUTANEOUS | Status: DC | PRN
Start: 2022-02-11 — End: 2022-02-11

## 2022-02-11 MED ORDER — ALTEPLASE 2 MG IJ SOLR: 2.0000 mg | Freq: Once | INTRAMUSCULAR | Status: DC | PRN

## 2022-02-11 MED ORDER — CHLORHEXIDINE GLUCONATE CLOTH 2 % EX PADS
6.0000 | MEDICATED_PAD | Freq: Every day | CUTANEOUS | Status: DC
Start: 2022-02-11 — End: 2022-02-12
  Administered 2022-02-11: 6 via TOPICAL
  Filled 2022-02-11: qty 6

## 2022-02-11 MED ORDER — POLYETHYLENE GLYCOL 3350 17 G PO PACK
17.0000 g | PACK | Freq: Two times a day (BID) | ORAL | Status: DC
  Administered 2022-02-11 – 2022-02-12 (×2): 17 g via ORAL
  Filled 2022-02-11 (×3): qty 1

## 2022-02-11 MED ORDER — LIDOCAINE HCL (PF) 1 % IJ SOLN
5.0000 mL | INTRAMUSCULAR | Status: DC | PRN
Start: 2022-02-11 — End: 2022-02-11

## 2022-02-11 MED ORDER — PENTAFLUOROPROP-TETRAFLUOROETH EX AERO: 1.0000 | INHALATION_SPRAY | CUTANEOUS | Status: DC | PRN

## 2022-02-11 MED ORDER — MELATONIN 5 MG PO TABS
5.0000 mg | ORAL_TABLET | Freq: Every day | ORAL | Status: DC
Start: 2022-02-11 — End: 2022-02-22
  Administered 2022-02-11 – 2022-02-20 (×10): 5 mg via ORAL
  Filled 2022-02-11 (×10): qty 1

## 2022-02-11 MED ORDER — HEPARIN SODIUM (PORCINE) 1000 UNIT/ML DIALYSIS: 1000.0000 [IU] | INTRAMUSCULAR | Status: DC | PRN

## 2022-02-11 NOTE — Progress Notes (Signed)
PROGRESS NOTE    Cassandra Joseph  TKZ:[id number] DOB: 1945-09-07 DOA: 02/10/2022 PCP: Tracie Harrier, MD    Brief Narrative:  76 year old with extensive cardiovascular morbidity, hypertension, chronic anemia, paroxysmal atrial fibrillation, ESRD on hemodialysis, history of hemorrhoids brought to the emergency room noticing blood in her diapers by her daughter at home.  Patient also complained of 1 episode of spitting blood while coughing.  Is bedbound.  She is on aspirin and Eliquis both.  Denies any abdominal pain.  In the emergency room hemoglobin is stable.  FOBT positive but no fresh bleeding. Admitted with GI consultation.  Patient is constipated.   Assessment & Plan:   Lower GI bleeding: Fairly stable today.  Hemoglobin with some drop but not needing any transfusions today. Patient has significant history of constipation, this could be bleeding from hemorrhoids or diverticular bleed.  Also possible outlet bleeding.  Colopathy on aspirin and Eliquis. GI consulted.  May benefit with limited flexible sigmoidoscopy, will keep n.p.o. until seen by them.  Aggressive bowel regimen if no procedures planned.  ESRD and hemodialysis: Due for dialysis today.  Updated nephrology for dialysis need.  Personal A-fib: Rate controlled.  Eliquis on hold.  Currently no indication to bridge with heparin.  Aspirin continued.  OSA on CPAP: Using CPAP at home settings.  Chronic pain issues: On gabapentin and Plaquenil. Patient is on high-dose of gabapentin for ESRD.  Will discuss with pharmacy.  Essential hypertension: Patient on amlodipine at home.  She is also on midodrine to boost her blood pressures.  I will defer to nephrology to adjust her medications.  Probably she will need midodrine during dialysis.   DVT prophylaxis: SCDs Start: 02/10/22 2008   Code Status: Full code Family Communication: None Disposition Plan: Status is: Inpatient Remains inpatient appropriate because: Significant  underlying illness, inpatient procedures planned.     Consultants:  Gastroenterology Nephrology  Procedures:  None  Antimicrobials:  None   Subjective: Patient seen and examined.  In the emergency room.  Denies any complaints at rest.  She tells me that she has constipation but never had any abdominal pain or nausea.  Only her daughter noticed blood in her diaper.  Due for dialysis today.  Objective: Vitals:   02/11/22 0400 02/11/22 0500 02/11/22 0700 02/11/22 0800  BP: (!) 136/59 (!) 93/52 136/64 (!) 142/64  Pulse: 72 71 65 (!) 105  Resp: '16 18 12 19  '$ Temp:      TempSrc:      SpO2: 95% 92% 99% 98%  Weight:      Height:       No intake or output data in the 24 hours ending 02/11/22 1227 Filed Weights   02/10/22 1542  Weight: 117.9 kg    Examination:  General exam: Appears calm and comfortable  Alert oriented.  Frail and debilitated.  On room air. Respiratory system: Clear to auscultation. Respiratory effort normal. Cardiovascular system: S1 & S2 heard, RRR.  Gastrointestinal system: Soft.  Nontender.  Bowel sound present.   Central nervous system: Alert and oriented. No focal neurological deficits. Extremities: Symmetric 5 x 5 power. Skin: Right upper extremity AV fistula present.  Thrill present.    Data Reviewed: I have personally reviewed following labs and imaging studies  CBC: Recent Labs  Lab 02/10/22 1453  WBC 6.7  HGB 9.1*  HCT 29.1*  MCV 97.3  PLT 573   Basic Metabolic Panel: Recent Labs  Lab 02/10/22 1453  NA 136  K 4.0  CL 94*  CO2 31  GLUCOSE 93  BUN 29*  CREATININE 4.99*  CALCIUM 9.0   GFR: Estimated Creatinine Clearance: 12.7 mL/min (A) (by C-G formula based on SCr of 4.99 mg/dL (H)). Liver Function Tests: Recent Labs  Lab 02/10/22 1453  AST 17  ALT 15  ALKPHOS 75  BILITOT 1.0  PROT 8.1  ALBUMIN 3.6   Recent Labs  Lab 02/10/22 2353  LIPASE 36   No results for input(s): "AMMONIA" in the last 168  hours. Coagulation Profile: No results for input(s): "INR", "PROTIME" in the last 168 hours. Cardiac Enzymes: No results for input(s): "CKTOTAL", "CKMB", "CKMBINDEX", "TROPONINI" in the last 168 hours. BNP (last 3 results) No results for input(s): "PROBNP" in the last 8760 hours. HbA1C: No results for input(s): "HGBA1C" in the last 72 hours. CBG: No results for input(s): "GLUCAP" in the last 168 hours. Lipid Profile: No results for input(s): "CHOL", "HDL", "LDLCALC", "TRIG", "CHOLHDL", "LDLDIRECT" in the last 72 hours. Thyroid Function Tests: No results for input(s): "TSH", "T4TOTAL", "FREET4", "T3FREE", "THYROIDAB" in the last 72 hours. Anemia Panel: No results for input(s): "VITAMINB12", "FOLATE", "FERRITIN", "TIBC", "IRON", "RETICCTPCT" in the last 72 hours. Sepsis Labs: Recent Labs  Lab 02/10/22 2237 02/11/22 0135  LATICACIDVEN 1.2 0.8    No results found for this or any previous visit (from the past 240 hour(s)).       Radiology Studies: CT ANGIO GI BLEED  Result Date: 02/11/2022 CLINICAL DATA:  Dark tarry stools x4 days. EXAM: CTA ABDOMEN AND PELVIS WITHOUT AND WITH CONTRAST TECHNIQUE: Multidetector CT imaging of the abdomen and pelvis was performed using the standard protocol during bolus administration of intravenous contrast. Multiplanar reconstructed images and MIPs were obtained and reviewed to evaluate the vascular anatomy. RADIATION DOSE REDUCTION: This exam was performed according to the departmental dose-optimization program which includes automated exposure control, adjustment of the mA and/or kV according to patient size and/or use of iterative reconstruction technique. CONTRAST:  41m OMNIPAQUE IOHEXOL 350 MG/ML SOLN COMPARISON:  November 19, 2021 FINDINGS: VASCULAR Aorta: Marked severity calcification and atherosclerosis without aneurysm, dissection, vasculitis or significant stenosis. Celiac: Moderate severity calcification without evidence of aneurysm, dissection,  vasculitis or significant stenosis. SMA: Moderate severity calcification without evidence of aneurysm, dissection, vasculitis or significant stenosis. Renals: There is marked severity calcification of the origin of the right renal artery with subsequent high-grade stenosis. Moderate to marked severity calcification of the origin of the left renal artery is also seen with mild to moderate severity subsequent stenosis. IMA: Marked severity calcification without evidence of aneurysm, dissection, vasculitis or significant stenosis. Inflow: Moderate severity calcification without evidence of aneurysm, dissection, vasculitis or significant stenosis. Proximal Outflow: Bilateral common femoral and visualized portions of the superficial and profunda femoral arteries are patent without evidence of aneurysm, dissection, vasculitis or significant stenosis. Veins: No obvious venous abnormality within the limitations of this arterial phase study. Review of the MIP images confirms the above findings. NON-VASCULAR Lower chest: Mild to moderate severity bibasilar atelectasis and/or infiltrate is seen. Hepatobiliary: A small, stable coarse parenchymal calcification is seen within the right lobe of the liver. No gallstones, gallbladder wall thickening, or biliary dilatation. Pancreas: Unremarkable. No pancreatic ductal dilatation or surrounding inflammatory changes. Spleen: Normal in size without focal abnormality. Adrenals/Urinary Tract: Adrenal glands are unremarkable. There is diffuse bilateral renal cortical thinning. Innumerable bilateral partially calcified and noncalcified cysts of various sizes are seen within both kidneys. This is seen on the prior study. The largest cystic appearing area within the left kidney  now contains a moderate to marked amount of heterogeneous attenuation (approximately 66.14 Hounsfield units). Moderate severity left-sided perinephric inflammatory fat stranding is also seen. This also represents a new  finding. No renal calculi are clearly identified. The urinary bladder is poorly distended and subsequently limited in evaluation. Stomach/Bowel: There is a small hiatal hernia. The appendix is surgically absent. No evidence of bowel dilatation noninflamed diverticula are seen throughout the sigmoid colon. Lymphatic: No abnormal abdominal or pelvic lymph nodes are identified. Reproductive: Status post hysterectomy. No adnexal masses. Other: A stable 4.3 cm x 2.2 cm fat containing ventral hernia is seen along the anterior aspect of the mid abdomen, to the right of midline. A stable 5.2 cm x 2.8 cm fat containing left inguinal hernia is seen. No abdominopelvic ascites. Coarse mesenteric calcifications are seen along the midline of the lower abdomen. This area is seen within the mid to lower right abdomen on the prior study. Musculoskeletal: Degenerative changes are seen throughout the lumbar spine with additional findings consistent with renal osteodystrophy. IMPRESSION: 1. Chronic renal atrophy and extensive cystic replacement of both kidneys with a moderate amount of hemorrhagic material suspected within the largest cysts on the left. Correlation with MRI is recommended for improved characterization of this region. 2. Given the degree of left-sided perinephric inflammatory fat stranding, sequelae associated with acute pyelonephritis cannot be excluded. 3. 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 4. Sigmoid diverticulosis. 5. Coarse mesenteric calcification which is stable in severity when compared to the prior exam. 6. Mild to moderate severity bibasilar atelectasis and/or infiltrate. 7. Aortic atherosclerosis. Aortic Atherosclerosis (ICD10-I70.0). Electronically Signed   By: Virgina Norfolk M.D.   On: 02/11/2022 00:16        Scheduled Meds:  amLODipine  5 mg  Oral Daily   aspirin EC  81 mg Oral Daily   atorvastatin  80 mg Oral QHS   Chlorhexidine Gluconate Cloth  6 each Topical Q0600   fluticasone  2 spray Each Nare Daily   gabapentin  300 mg Oral TID   hydroxychloroquine  200 mg Oral Daily   midodrine  10 mg Oral TID WC   sevelamer carbonate  2,400 mg Oral TID WC   Continuous Infusions:   LOS: 0 days    Time spent: 35 minutes    Barb Merino, MD [facility] Pager [phone]

## 2022-02-11 NOTE — Care Management (Signed)
RNCM attempted to see patient, was off the floor for a procedure.

## 2022-02-11 NOTE — Progress Notes (Signed)
[facility]  ROUNDING NOTE   Subjective:   Cassandra Joseph is a 76 year old female with past medical history including hypertension, multiple myeloma, anemia, and end-stage renal disease on hemodialysis.  Patient presents to the emergency department complaining of blood in stool.  Patient has been admitted for Melena [K92.1] Rectal bleeding [K62.5] Anticoagulated [Z79.01] Gastrointestinal hemorrhage, unspecified gastrointestinal hemorrhage type [K92.2]  Patient is known to our practice from previous admissions and receives outpatient dialysis treatments at [facility] on a MWF schedule, supervised by [facility] physicians.  Patient states she is has intermittent dark stools.  States is usually not consistent.  Patient reports 1 instance of spitting up blood when coughing.  She states this concerned herself and her daughter, whom she lives with.  Patient does have history of hemorrhoids and complains of recent constipation.  Labs on ED arrival not concerning for renal patient.  Hemoglobin currently 9.1, recent CBC from 12/21/2021 shows hemoglobin 9.8.  We have been consulted to provide dialysis.  Objective:  Vital signs in last 24 hours:  Temp:  [97 F (36.1 C)-97.9 F (36.6 C)] 97 F (36.1 C) (07/07 1251) Pulse Rate:  [57-105] 69 (07/07 1530) Resp:  [12-31] 20 (07/07 1530) BP: (93-159)/(45-126) 140/78 (07/07 1530) SpO2:  [84 %-100 %] 96 % (07/07 1530) Weight:  [109.1 kg] 109.1 kg (07/07 1251)  Weight change:  Filed Weights   02/10/22 1542 02/11/22 1251  Weight: 117.9 kg 109.1 kg    Intake/Output: No intake/output data recorded.   Intake/Output this shift:  No intake/output data recorded.  Physical Exam: General: NAD, resting comfortably  Head: Normocephalic, atraumatic. Moist oral mucosal membranes  Eyes: Anicteric  Lungs:  Clear to auscultation, normal effort, [place] O2  Heart: Regular rate and rhythm  Abdomen:  Soft, nontender, obese  Extremities: 1+  peripheral edema.  Neurologic: Nonfocal, moving all four extremities  Skin: No lesions  Access: Left aVF    Basic Metabolic Panel: Recent Labs  Lab 02/10/22 1453  NA 136  K 4.0  CL 94*  CO2 31  GLUCOSE 93  BUN 29*  CREATININE 4.99*  CALCIUM 9.0    Liver Function Tests: Recent Labs  Lab 02/10/22 1453  AST 17  ALT 15  ALKPHOS 75  BILITOT 1.0  PROT 8.1  ALBUMIN 3.6   Recent Labs  Lab 02/10/22 2353  LIPASE 36   No results for input(s): "AMMONIA" in the last 168 hours.  CBC: Recent Labs  Lab 02/10/22 1453  WBC 6.7  HGB 9.1*  HCT 29.1*  MCV 97.3  PLT 232    Cardiac Enzymes: No results for input(s): "CKTOTAL", "CKMB", "CKMBINDEX", "TROPONINI" in the last 168 hours.  BNP: Invalid input(s): "POCBNP"  CBG: No results for input(s): "GLUCAP" in the last 168 hours.  Microbiology: Results for orders placed or performed during the hospital encounter of 11/19/21  Blood Culture (routine x 2)     Status: None   Collection Time: 11/19/21  4:14 PM   Specimen: BLOOD  Result Value Ref Range Status   Specimen Description BLOOD BLOOD LEFT FOREARM  Final   Special Requests   Final    BOTTLES DRAWN AEROBIC AND ANAEROBIC Blood Culture adequate volume   Culture   Final    NO GROWTH 5 DAYS Performed at [facility], [address] [place], [place] [address]    Report Status 11/24/2021 FINAL  Final  Blood Culture (routine x 2)     Status: None   Collection Time: 11/19/21  4:19 PM   Specimen: BLOOD  Result Value Ref Range Status   Specimen Description BLOOD BLOOD LEFT HAND  Final   Special Requests   Final    BOTTLES DRAWN AEROBIC AND ANAEROBIC Blood Culture adequate volume   Culture   Final    NO GROWTH 5 DAYS Performed at [facility], [address] [place], [place] [address]    Report Status 11/24/2021 FINAL  Final  Resp Panel by RT-PCR (Flu A&B, Covid) Nasopharyngeal Swab     Status: None   Collection Time: 11/19/21  4:29 PM    Specimen: Nasopharyngeal Swab; Nasopharyngeal(NP) swabs in vial transport medium  Result Value Ref Range Status   SARS Coronavirus 2 by RT PCR NEGATIVE NEGATIVE Final    Comment: (NOTE) SARS-CoV-2 target nucleic acids are NOT DETECTED.  The SARS-CoV-2 RNA is generally detectable in upper respiratory specimens during the acute phase of infection. The lowest concentration of SARS-CoV-2 viral copies this assay can detect is 138 copies/mL. A negative result does not preclude SARS-Cov-2 infection and should not be used as the sole basis for treatment or other patient management decisions. A negative result may occur with  improper specimen collection/handling, submission of specimen other than nasopharyngeal swab, presence of viral mutation(s) within the areas targeted by this assay, and inadequate number of viral copies(<138 copies/mL). A negative result must be combined with clinical observations, patient history, and epidemiological information. The expected result is Negative.  Fact Sheet for Patients:  EntrepreneurPulse.com.au  Fact Sheet for Healthcare Providers:  IncredibleEmployment.be  This test is no t yet approved or cleared by the [place] FDA and  has been authorized for detection and/or diagnosis of SARS-CoV-2 by FDA under an Emergency Use Authorization (EUA). This EUA will remain  in effect (meaning this test can be used) for the duration of the COVID-19 declaration under Section 564(b)(1) of the Act, 21 U.S.C.section 360bbb-3(b)(1), unless the authorization is terminated  or revoked sooner.       Influenza A by PCR NEGATIVE NEGATIVE Final   Influenza B by PCR NEGATIVE NEGATIVE Final    Comment: (NOTE) The Xpert Xpress SARS-CoV-2/FLU/RSV plus assay is intended as an aid in the diagnosis of influenza from Nasopharyngeal swab specimens and should not be used as a sole basis for treatment. Nasal washings and aspirates are  unacceptable for Xpert Xpress SARS-CoV-2/FLU/RSV testing.  Fact Sheet for Patients: EntrepreneurPulse.com.au  Fact Sheet for Healthcare Providers: IncredibleEmployment.be  This test is not yet approved or cleared by the [place] FDA and has been authorized for detection and/or diagnosis of SARS-CoV-2 by FDA under an Emergency Use Authorization (EUA). This EUA will remain in effect (meaning this test can be used) for the duration of the COVID-19 declaration under Section 564(b)(1) of the Act, 21 U.S.C. section 360bbb-3(b)(1), unless the authorization is terminated or revoked.  Performed at [facility], [address] [place], [place] [address]     Coagulation Studies: No results for input(s): "LABPROT", "INR" in the last 72 hours.  Urinalysis: No results for input(s): "COLORURINE", "LABSPEC", "PHURINE", "GLUCOSEU", "HGBUR", "BILIRUBINUR", "KETONESUR", "PROTEINUR", "UROBILINOGEN", "NITRITE", "LEUKOCYTESUR" in the last 72 hours.  Invalid input(s): "APPERANCEUR"    Imaging: CT ANGIO GI BLEED  Result Date: 02/11/2022 CLINICAL DATA:  Dark tarry stools x4 days. EXAM: CTA ABDOMEN AND PELVIS WITHOUT AND WITH CONTRAST TECHNIQUE: Multidetector CT imaging of the abdomen and pelvis was performed using the standard protocol during bolus administration of intravenous contrast. Multiplanar reconstructed images and MIPs were obtained  and reviewed to evaluate the vascular anatomy. RADIATION DOSE REDUCTION: This exam was performed according to the departmental dose-optimization program which includes automated exposure control, adjustment of the mA and/or kV according to patient size and/or use of iterative reconstruction technique. CONTRAST:  20m OMNIPAQUE IOHEXOL 350 MG/ML SOLN COMPARISON:  November 19, 2021 FINDINGS: VASCULAR Aorta: Marked severity calcification and atherosclerosis without aneurysm, dissection, vasculitis or significant stenosis.  Celiac: Moderate severity calcification without evidence of aneurysm, dissection, vasculitis or significant stenosis. SMA: Moderate severity calcification without evidence of aneurysm, dissection, vasculitis or significant stenosis. Renals: There is marked severity calcification of the origin of the right renal artery with subsequent high-grade stenosis. Moderate to marked severity calcification of the origin of the left renal artery is also seen with mild to moderate severity subsequent stenosis. IMA: Marked severity calcification without evidence of aneurysm, dissection, vasculitis or significant stenosis. Inflow: Moderate severity calcification without evidence of aneurysm, dissection, vasculitis or significant stenosis. Proximal Outflow: Bilateral common femoral and visualized portions of the superficial and profunda femoral arteries are patent without evidence of aneurysm, dissection, vasculitis or significant stenosis. Veins: No obvious venous abnormality within the limitations of this arterial phase study. Review of the MIP images confirms the above findings. NON-VASCULAR Lower chest: Mild to moderate severity bibasilar atelectasis and/or infiltrate is seen. Hepatobiliary: A small, stable coarse parenchymal calcification is seen within the right lobe of the liver. No gallstones, gallbladder wall thickening, or biliary dilatation. Pancreas: Unremarkable. No pancreatic ductal dilatation or surrounding inflammatory changes. Spleen: Normal in size without focal abnormality. Adrenals/Urinary Tract: Adrenal glands are unremarkable. There is diffuse bilateral renal cortical thinning. Innumerable bilateral partially calcified and noncalcified cysts of various sizes are seen within both kidneys. This is seen on the prior study. The largest cystic appearing area within the left kidney now contains a moderate to marked amount of heterogeneous attenuation (approximately 66.14 Hounsfield units). Moderate severity  left-sided perinephric inflammatory fat stranding is also seen. This also represents a new finding. No renal calculi are clearly identified. The urinary bladder is poorly distended and subsequently limited in evaluation. Stomach/Bowel: There is a small hiatal hernia. The appendix is surgically absent. No evidence of bowel dilatation noninflamed diverticula are seen throughout the sigmoid colon. Lymphatic: No abnormal abdominal or pelvic lymph nodes are identified. Reproductive: Status post hysterectomy. No adnexal masses. Other: A stable 4.3 cm x 2.2 cm fat containing ventral hernia is seen along the anterior aspect of the mid abdomen, to the right of midline. A stable 5.2 cm x 2.8 cm fat containing left inguinal hernia is seen. No abdominopelvic ascites. Coarse mesenteric calcifications are seen along the midline of the lower abdomen. This area is seen within the mid to lower right abdomen on the prior study. Musculoskeletal: Degenerative changes are seen throughout the lumbar spine with additional findings consistent with renal osteodystrophy. IMPRESSION: 1. Chronic renal atrophy and extensive cystic replacement of both kidneys with a moderate amount of hemorrhagic material suspected within the largest cysts on the left. Correlation with MRI is recommended for improved characterization of this region. 2. Given the degree of left-sided perinephric inflammatory fat stranding, sequelae associated with acute pyelonephritis cannot be excluded. 3. 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 4. Sigmoid diverticulosis. 5. Coarse mesenteric calcification which is stable in severity when compared to the prior exam. 6. Mild to moderate severity bibasilar atelectasis and/or infiltrate. 7. Aortic atherosclerosis. Aortic  Atherosclerosis (ICD10-I70.0). Electronically Signed   By:  Virgina Norfolk M.D.   On: 02/11/2022 00:16     Medications:     amLODipine  5 mg Oral Daily   aspirin EC  81 mg Oral Daily   atorvastatin  80 mg Oral QHS   Chlorhexidine Gluconate Cloth  6 each Topical Q0600   fluticasone  2 spray Each Nare Daily   gabapentin  300 mg Oral TID   hydroxychloroquine  200 mg Oral Daily   midodrine  10 mg Oral TID WC   polyethylene glycol  17 g Oral BID   sevelamer carbonate  2,400 mg Oral TID WC   acetaminophen **OR** acetaminophen, alteplase, heparin, hydrALAZINE, lidocaine (PF), lidocaine-prilocaine, ondansetron **OR** ondansetron (ZOFRAN) IV, pentafluoroprop-tetrafluoroeth  Assessment/ Plan:  Cassandra Joseph is a 76 y.o.  female  with  hypertension, anemia, multiple myeloma, chronic respiratory failure on home O2, and end stage renal disease on dialysis  was admitted for Melena [K92.1] Rectal bleeding [K62.5] Anticoagulated [Z79.01] Gastrointestinal hemorrhage, unspecified gastrointestinal hemorrhage type [K92.2]   [facility]/MWF/Rt lower forearm AVF  Melena/anemia with chronic kidney disease.  Hemoglobin currently 9.1.  Patient reports constipation with known hemorrhoids.  GI following and suspects this may be from hemorrhoids.  Will defer colonoscopy and monitor.  2.  End-stage renal disease on hemodialysis.  Patient has not missed any recent treatments.  Plan to receive scheduled treatment later today.  Due to suspected blood loss, UF goal remains low.  3. Secondary Hyperparathyroidism:  Lab Results  Component Value Date   PTH 242 (H) 09/29/2020   CALCIUM 9.0 02/10/2022   PHOS 4.2 05/18/2021    Calcium remains within acceptable range.  We will continue to monitor.  Continue sevelamer with meals.  4.  Hypertension with chronic kidney disease.  Home regimen includes amlodipine, midodrine and hydrochlorothiazide.  Currently prescribed these medications.    LOS: 1 Phylis Javed 7/7/20233:46 PM

## 2022-02-11 NOTE — Progress Notes (Signed)
Hemodialysis Post Treatment Note  Patient presents for hemodialysis having presented to the ED with rectal bleeding. Patient states she is not in any discomfort, breathing is unlabored, on 3LPM O2 via [place], she is alert and oriented. Patient has left forearm fistula with bruit, thrill, cannulates without difficulty, maintains the prescribed blood flow rate throughout course of treatment. Target UF met with 0.5 liter fluid reduction. Labs drawn per order, no medication given this treatment. Patient given bed assignment, transported to room 228, nurse given report.

## 2022-02-11 NOTE — Consult Note (Signed)
Consultation  Referring Provider: Hospitalist     Admit date: 02/10/2022 Consult date: 02/11/2022         Reason for Consultation:    Hematochezia          HPI:   Cassandra Joseph is a 76 y.o. lady with multiple comorbidities including ESRD requiring hemodialysis, obesity, and multiple myeloma who presents with small volume hematochezia in the setting of severe constipation. This has been a chronic issue for her and she actually saw surgery for prolapsed hemorrhoids last year but no intervention planned due to comorbidities. Patient is bed bound. She thinks she had a colonoscopy before but is not sure. Spoke with oldest daughter and she notes constipation has been an issue for her for over a year. She takes aspirin and eliquis. Baseline hemoglobin appears to be 9-10 which is where she is at now. She also endorses small volume hemoptysis but denies hematemesis. She endorses abdominal pain but it's associated with her constipation.   Past Medical History:  Diagnosis Date   Signa Kell tumor    a. s/p TAH/BSO 08/2008   CHF (congestive heart failure) ([place])    Effusion of left hip 09/28/2020   ESRD (end stage renal disease) ([place])    a. on HD x 11 years; b. felt to be 2/2 HTN; c. HD TTS   History of anemia due to chronic kidney disease    History of nuclear stress test    a. 08/2015: probably normal myocardial perfusion study, no evidence for significant ischemia or scar was noted, during stress, global systolic function normal, EF 54%, RV borderline dilatation, coronary artery calcifications and marked mitral annular calcifications were noted, liver calcifications were noted on attenuation CT scan likely represent sequelae of previous granulomatous disease   HLD (hyperlipidemia)    Hypertension    Ischemic stroke ([place])    a. 03/2014   Left leg pain    Metabolic syndrome    Morbid obesity ([place])    Multiple myeloma ([place])    a. in remission   Osteomyelitis ([place])    a. s/p left 5th toe amputation 08/2015    Pneumonia due to COVID-19 virus 08/07/2020   Renal disorder    Rheumatoid arthritis ([place])    Septic arthritis ([place]) 09/27/2020   TIA (transient ischemic attack)    a. 2014   Viral pneumonia 08/08/2020    Past Surgical History:  Procedure Laterality Date   ABDOMINAL HYSTERECTOMY     APPENDECTOMY     eyelid surgery     HERNIA REPAIR     right knee surgery      Family History  Problem Relation Age of Onset   Seizures Mother    Hypertension Mother    Throat cancer Father    Breast cancer Neg Hx     Social History   Tobacco Use   Smoking status: Former    Types: Cigarettes   Smokeless tobacco: Never   Tobacco comments:    Quit in 2007  Substance Use Topics   Alcohol use: No   Drug use: No    Prior to Admission medications   Medication Sig Start Date End Date Taking? Authorizing Provider  amLODipine (NORVASC) 10 MG tablet Take 5 mg by mouth daily. 01/11/22  Yes [provider]  apixaban (ELIQUIS) 2.5 MG TABS tablet Take 1 tablet (2.5 mg total) by mouth 2 (two) times daily. 06/28/19  Yes Lavina Hamman, MD  ASPIRIN LOW DOSE 81 MG EC tablet Take 81 mg by  mouth daily. 12/26/20  Yes [provider]  atorvastatin (LIPITOR) 80 MG tablet Take 1 tablet (80 mg total) by mouth daily. 06/29/19  Yes Lavina Hamman, MD  b complex-vitamin c-folic acid (NEPHRO-VITE) 0.8 MG TABS tablet Take 1 tablet by mouth daily. 09/27/16  Yes [provider]  cinacalcet (SENSIPAR) 60 MG tablet Take 60 mg by mouth 2 (two) times daily.   Yes [provider]  docusate sodium (COLACE) 100 MG capsule Take 100 mg by mouth 2 (two) times daily. 03/08/21  Yes [provider]  epoetin alfa (EPOGEN) 4000 UNIT/ML injection Inject 1 mL (4,000 Units total) into the vein Every Tuesday,Thursday,and Saturday with dialysis. Patient taking differently: Inject 4,000 Units into the vein every Monday, Wednesday, and Friday with hemodialysis. 10/05/20  Yes Cherene Altes, MD   fluticasone (FLONASE) 50 MCG/ACT nasal spray Place 1-2 sprays into both nostrils daily at 12 noon. 12/26/20  Yes [provider]  gabapentin (NEURONTIN) 300 MG capsule Take 300 mg by mouth 3 (three) times daily. 07/27/20  Yes [provider]  hydroxychloroquine (PLAQUENIL) 200 MG tablet Take 200 mg by mouth daily. 06/30/20  Yes [provider]  midodrine (PROAMATINE) 10 MG tablet Take 1 tablet (10 mg total) by mouth 3 (three) times daily with meals. 06/28/19  Yes Lavina Hamman, MD  polyethylene glycol (MIRALAX / GLYCOLAX) 17 g packet Take 17 g by mouth 2 (two) times daily. 10/05/20  Yes Cherene Altes, MD  senna (SENOKOT) 8.6 MG tablet Take 2 tablets by mouth at bedtime. 11/27/20  Yes [provider]  sevelamer carbonate (RENVELA) 800 MG tablet Take 1,600-2,400 mg by mouth 3 (three) times daily. Take 3 tabs with meals and 2 tabs with snacks 03/16/21  Yes [provider]  Hydrocortisone, Perianal, 1 % CREA Apply topically. 11/24/20   [provider]  Nutritional Supplements (FEEDING SUPPLEMENT, NEPRO CARB STEADY,) LIQD Take 237 mLs by mouth 3 (three) times daily between meals. 05/01/21   Ezekiel Slocumb, DO    Current Facility-Administered Medications  Medication Dose Route Frequency Provider Last Rate Last Admin   acetaminophen (TYLENOL) tablet 650 mg  650 mg Oral Q6H PRN Para Skeans, MD       Or   acetaminophen (TYLENOL) suppository 650 mg  650 mg Rectal Q6H PRN Para Skeans, MD       alteplase (CATHFLO ACTIVASE) injection 2 mg  2 mg Intracatheter Once PRN Colon Flattery, NP       amLODipine (NORVASC) tablet 5 mg  5 mg Oral Daily Florina Ou V, MD   5 mg at 02/11/22 0950   aspirin EC tablet 81 mg  81 mg Oral Daily Para Skeans, MD   81 mg at 02/11/22 0950   atorvastatin (LIPITOR) tablet 80 mg  80 mg Oral QHS Para Skeans, MD   80 mg at 02/10/22 2132   Chlorhexidine Gluconate Cloth 2 % PADS 6 each  6 each Topical Q0600 Colon Flattery, NP   6 each at 02/11/22 1145   fluticasone (FLONASE) 50 MCG/ACT nasal spray 2 spray  2 spray Each Nare Daily Para Skeans, MD   2 spray at 02/11/22 1237   gabapentin (NEURONTIN) capsule 300 mg  300 mg Oral TID Para Skeans, MD   300 mg at 02/11/22 0950   heparin injection 1,000 Units  1,000 Units Dialysis PRN Colon Flattery, NP       hydrALAZINE (APRESOLINE) injection 10 mg  10  mg Intravenous Q4H PRN Para Skeans, MD       hydroxychloroquine (PLAQUENIL) tablet 200 mg  200 mg Oral Daily Florina Ou V, MD   200 mg at 02/11/22 0951   lidocaine (PF) (XYLOCAINE) 1 % injection 5 mL  5 mL Intradermal PRN Colon Flattery, NP       lidocaine-prilocaine (EMLA) cream 1 Application  1 Application Topical PRN Colon Flattery, NP       midodrine (PROAMATINE) tablet 10 mg  10 mg Oral TID WC Florina Ou V, MD   10 mg at 02/11/22 1145   ondansetron (ZOFRAN) tablet 4 mg  4 mg Oral Q6H PRN Para Skeans, MD       Or   ondansetron [facility]) injection 4 mg  4 mg Intravenous Q6H PRN Para Skeans, MD   4 mg at 02/11/22 0804   pentafluoroprop-tetrafluoroeth (GEBAUERS) aerosol 1 Application  1 Application Topical PRN Colon Flattery, NP       polyethylene glycol (MIRALAX / GLYCOLAX) packet 17 g  17 g Oral BID Barb Merino, MD       sevelamer carbonate (RENVELA) tablet 2,400 mg  2,400 mg Oral TID WC Florina Ou V, MD   2,400 mg at 02/11/22 1236    Allergies as of 02/10/2022 - Review Complete 02/10/2022  Allergen Reaction Noted   Shellfish allergy Shortness Of Breath 11/09/2016   Shellfish allergy Other (See Comments) 04/28/2021     Review of Systems:    All systems reviewed and negative except where noted in HPI.  Review of Systems  Constitutional:  Negative for chills and fever.  Respiratory:  Negative for shortness of breath.   Cardiovascular:  Negative for chest pain.  Gastrointestinal:  Positive for abdominal pain, blood in stool and constipation. Negative for diarrhea, nausea and  vomiting.  Musculoskeletal:  Positive for joint pain.  Skin:  Negative for itching and rash.  Neurological:  Positive for weakness.  Psychiatric/Behavioral:  Negative for substance abuse.   All other systems reviewed and are negative.      Physical Exam:  Vital signs in last 24 hours: Temp:  [97 F (36.1 C)-97.9 F (36.6 C)] 97 F (36.1 C) (07/07 1251) Pulse Rate:  [57-105] 69 (07/07 1530) Resp:  [12-31] 20 (07/07 1530) BP: (93-159)/(45-126) 140/78 (07/07 1530) SpO2:  [84 %-100 %] 96 % (07/07 1530) Weight:  [109.1 kg] 109.1 kg (07/07 1251) Last BM Date : 02/10/22 General:   Pleasant in NAD. Frail appearing. Head:  Normocephalic and atraumatic. Mouth: Mucosa pink moist, no lesions. Neck:  Supple; no masses felt Lungs:  No respiratory distress Abdomen:   Flat, soft, nondistended, nontender. No appreciable masses or hepatomegaly. No rebound signs or other peritoneal signs. Msk:  No clubbing or cyanosis Neurologic:  Alert and  oriented x4;  Cranial nerves II-XII intact.  Skin:  Warm, dry, pink without significant lesions or rashes. Psych:  Alert and cooperative. Normal affect.  LAB RESULTS: Recent Labs    02/10/22 1453  WBC 6.7  HGB 9.1*  HCT 29.1*  PLT 232   BMET Recent Labs    02/10/22 1453  NA 136  K 4.0  CL 94*  CO2 31  GLUCOSE 93  BUN 29*  CREATININE 4.99*  CALCIUM 9.0   LFT Recent Labs    02/10/22 1453  PROT 8.1  ALBUMIN 3.6  AST 17  ALT 15  ALKPHOS 75  BILITOT 1.0   PT/INR No results for input(s): "LABPROT", "INR" in the last 72  hours.  STUDIES: CT ANGIO GI BLEED  Result Date: 02/11/2022 CLINICAL DATA:  Dark tarry stools x4 days. EXAM: CTA ABDOMEN AND PELVIS WITHOUT AND WITH CONTRAST TECHNIQUE: Multidetector CT imaging of the abdomen and pelvis was performed using the standard protocol during bolus administration of intravenous contrast. Multiplanar reconstructed images and MIPs were obtained and reviewed to evaluate the vascular anatomy.  RADIATION DOSE REDUCTION: This exam was performed according to the departmental dose-optimization program which includes automated exposure control, adjustment of the mA and/or kV according to patient size and/or use of iterative reconstruction technique. CONTRAST:  39m OMNIPAQUE IOHEXOL 350 MG/ML SOLN COMPARISON:  November 19, 2021 FINDINGS: VASCULAR Aorta: Marked severity calcification and atherosclerosis without aneurysm, dissection, vasculitis or significant stenosis. Celiac: Moderate severity calcification without evidence of aneurysm, dissection, vasculitis or significant stenosis. SMA: Moderate severity calcification without evidence of aneurysm, dissection, vasculitis or significant stenosis. Renals: There is marked severity calcification of the origin of the right renal artery with subsequent high-grade stenosis. Moderate to marked severity calcification of the origin of the left renal artery is also seen with mild to moderate severity subsequent stenosis. IMA: Marked severity calcification without evidence of aneurysm, dissection, vasculitis or significant stenosis. Inflow: Moderate severity calcification without evidence of aneurysm, dissection, vasculitis or significant stenosis. Proximal Outflow: Bilateral common femoral and visualized portions of the superficial and profunda femoral arteries are patent without evidence of aneurysm, dissection, vasculitis or significant stenosis. Veins: No obvious venous abnormality within the limitations of this arterial phase study. Review of the MIP images confirms the above findings. NON-VASCULAR Lower chest: Mild to moderate severity bibasilar atelectasis and/or infiltrate is seen. Hepatobiliary: A small, stable coarse parenchymal calcification is seen within the right lobe of the liver. No gallstones, gallbladder wall thickening, or biliary dilatation. Pancreas: Unremarkable. No pancreatic ductal dilatation or surrounding inflammatory changes. Spleen: Normal in size  without focal abnormality. Adrenals/Urinary Tract: Adrenal glands are unremarkable. There is diffuse bilateral renal cortical thinning. Innumerable bilateral partially calcified and noncalcified cysts of various sizes are seen within both kidneys. This is seen on the prior study. The largest cystic appearing area within the left kidney now contains a moderate to marked amount of heterogeneous attenuation (approximately 66.14 Hounsfield units). Moderate severity left-sided perinephric inflammatory fat stranding is also seen. This also represents a new finding. No renal calculi are clearly identified. The urinary bladder is poorly distended and subsequently limited in evaluation. Stomach/Bowel: There is a small hiatal hernia. The appendix is surgically absent. No evidence of bowel dilatation noninflamed diverticula are seen throughout the sigmoid colon. Lymphatic: No abnormal abdominal or pelvic lymph nodes are identified. Reproductive: Status post hysterectomy. No adnexal masses. Other: A stable 4.3 cm x 2.2 cm fat containing ventral hernia is seen along the anterior aspect of the mid abdomen, to the right of midline. A stable 5.2 cm x 2.8 cm fat containing left inguinal hernia is seen. No abdominopelvic ascites. Coarse mesenteric calcifications are seen along the midline of the lower abdomen. This area is seen within the mid to lower right abdomen on the prior study. Musculoskeletal: Degenerative changes are seen throughout the lumbar spine with additional findings consistent with renal osteodystrophy. IMPRESSION: 1. Chronic renal atrophy and extensive cystic replacement of both kidneys with a moderate amount of hemorrhagic material suspected within the largest cysts on the left. Correlation with MRI is recommended for improved characterization of this region. 2. Given the degree of left-sided perinephric inflammatory fat stranding, sequelae associated with acute pyelonephritis cannot be excluded. 3. Marked  severity  calcification and atherosclerosis of the arterial structures within the abdomen and pelvis, as described above, with subsequent high-grade stenosis of the right renal artery. 4. Sigmoid diverticulosis. 5. Coarse mesenteric calcification which is stable in severity when compared to the prior exam. 6. Mild to moderate severity bibasilar atelectasis and/or infiltrate. 7. Aortic atherosclerosis. Aortic Atherosclerosis (ICD10-I70.0). Electronically Signed   By: Virgina Norfolk M.D.   On: 02/11/2022 00:16       Impression / Plan:   76 y/o lady with multiple comorbidities including ESRD on hemodialysis who is bedridden here for small volume hematochezia in the setting of known grade III-IV hemorrhoids and severe constipation. Discussed with daughter that given her medical issues and chronicity of symptoms that would recommend conservative treatment with laxatives. Discussed that we could be missing something like a malignancy but given the patient's severe comorbidities, would start with conservative treatment  - ok for diet - bowel regimen - can restart doac at discharge - please call with any overt GI bleeding  Raylene Miyamoto MD, MPH [facility]  Dr. Virgina Jock is covering over the weekend if any questions.

## 2022-02-12 DIAGNOSIS — K921 Melena: Secondary | ICD-10-CM | POA: Diagnosis not present

## 2022-02-12 LAB — HEPATITIS B SURFACE ANTIBODY, QUANTITATIVE: Hep B S AB Quant (Post): 42.8 m[IU]/mL (ref >9.9)

## 2022-02-12 LAB — HEMOGLOBIN AND HEMATOCRIT, BLOOD
HCT: 25.1 % — ABNORMAL LOW (ref 36.0–46.0)
Hemoglobin: 7.9 g/dL — ABNORMAL LOW (ref 12.0–15.0)

## 2022-02-12 MED ORDER — BISACODYL 10 MG RE SUPP
10.0000 mg | Freq: Once | RECTAL | Status: AC
  Administered 2022-02-12: 10 mg via RECTAL
  Filled 2022-02-12: qty 1

## 2022-02-12 MED ORDER — LACTULOSE 10 GM/15ML PO SOLN
30.0000 g | Freq: Once | ORAL | Status: AC
  Administered 2022-02-12: 30 g via ORAL
  Filled 2022-02-12: qty 60

## 2022-02-12 NOTE — Progress Notes (Signed)
PROGRESS NOTE    Cassandra GRISSETT  OZH:[id number] DOB: 11-05-1945 DOA: 02/10/2022 PCP: Tracie Harrier, MD    Brief Narrative:  76 year old with extensive cardiovascular morbidity, hypertension, chronic anemia, paroxysmal atrial fibrillation, ESRD on hemodialysis, history of hemorrhoids brought to the emergency room noticing blood in her diapers by her daughter at home.  Patient also complained of 1 episode of spitting blood while coughing.  Is bedbound.  She is on aspirin and Eliquis both.  Denies any abdominal pain.  In the emergency room hemoglobin is stable.  FOBT positive but no fresh bleeding. Admitted with GI consultation.  Patient is constipated. Continues to have small volume hematochezia.  GI recommended conservative management.   Assessment & Plan:   Lower GI bleeding: Fairly stable today.  Hemoglobin with some drop but not needing any transfusions today.  Does have a history of prolapsed hemorrhoids. Patient has significant history of constipation, this could be bleeding from hemorrhoids or diverticular bleed.  Also possible outlet bleeding.  Coagulopathy on aspirin and Eliquis. Bleeding scan on presentation without evidence of ongoing bleeding.  Negative angiogram. GI consulted, risk versus benefits were discussed and patient would like to postpone colonoscopy. Continues to have hematochezia.  Continue monitoring hemoglobin every 12 hours.  If ongoing hematochezia, will reconsult gastroenterology and she may benefit with at least a limited flexible sigmoidoscopy. Constipation is significant issue, MiraLAX 17 g twice a day, 1 dose of Dulcolax suppository today.  If no results, will start using high-dose lactulose.  ESRD and hemodialysis: Receiving dialysis on her schedule.  Personal A-fib: Rate controlled.  Eliquis on hold.  Currently no indication to bridge with heparin.  Aspirin continued.  OSA on CPAP: Using CPAP at home settings.  Chronic pain issues: On gabapentin and  Plaquenil. Patient is on high-dose of gabapentin for ESRD.    Essential hypertension: Patient on amlodipine at home.  She is also on midodrine to boost her blood pressures.  I will defer to nephrology to adjust her medications.  Probably she will need midodrine during dialysis.   DVT prophylaxis: SCDs Start: 02/10/22 2008   Code Status: Full code Family Communication: Daughter on the phone. Disposition Plan: Status is: Inpatient Remains inpatient appropriate because: Significant underlying illness, ongoing lower GI bleeding.     Consultants:  Gastroenterology Nephrology  Procedures:  None  Antimicrobials:  None   Subjective:  Patient seen and examined.  Denies any abdomen pain nausea vomiting.  She had frank blood on her bed sheet last night, nursing staff reported that to be 5 to 10 mL.  No bowel movement for last 5 days.  Daughter on the phone.  Objective: Vitals:   02/11/22 1700 02/11/22 2046 02/12/22 0434 02/12/22 0816  BP: (!) 135/108 (!) 135/53 138/74 (!) 122/57  Pulse: 69 65 67 67  Resp: (!) '21 20 18 18  '$ Temp:  97.9 F (36.6 C) 97.9 F (36.6 C) 98 F (36.7 C)  TempSrc:      SpO2: 100% 98% 98% 97%  Weight:      Height:        Intake/Output Summary (Last 24 hours) at 02/12/2022 1332 Last data filed at 02/11/2022 2300 Gross per 24 hour  Intake 180 ml  Output 502 ml  Net -322 ml   Filed Weights   02/10/22 1542 02/11/22 1251 02/11/22 1630  Weight: 117.9 kg 109.1 kg 108.6 kg    Examination:  General exam: Appears calm and comfortable.  Frail debilitated.  Bedbound. Alert and oriented. Respiratory system: Clear  to auscultation. Respiratory effort normal. Cardiovascular system: S1 & S2 heard, RRR.  Gastrointestinal system: Soft.  Nontender.  Bowel sound present.   Central nervous system: Alert and oriented. No focal neurological deficits. Extremities: Symmetric 5 x 5 power. Skin: Right upper extremity AV fistula present.  Thrill present.    Data  Reviewed: I have personally reviewed following labs and imaging studies  CBC: Recent Labs  Lab 02/10/22 1453  WBC 6.7  HGB 9.1*  HCT 29.1*  MCV 97.3  PLT 024   Basic Metabolic Panel: Recent Labs  Lab 02/10/22 1453  NA 136  K 4.0  CL 94*  CO2 31  GLUCOSE 93  BUN 29*  CREATININE 4.99*  CALCIUM 9.0   GFR: Estimated Creatinine Clearance: 12.1 mL/min (A) (by C-G formula based on SCr of 4.99 mg/dL (H)). Liver Function Tests: Recent Labs  Lab 02/10/22 1453  AST 17  ALT 15  ALKPHOS 75  BILITOT 1.0  PROT 8.1  ALBUMIN 3.6   Recent Labs  Lab 02/10/22 2353  LIPASE 36   No results for input(s): "AMMONIA" in the last 168 hours. Coagulation Profile: No results for input(s): "INR", "PROTIME" in the last 168 hours. Cardiac Enzymes: No results for input(s): "CKTOTAL", "CKMB", "CKMBINDEX", "TROPONINI" in the last 168 hours. BNP (last 3 results) No results for input(s): "PROBNP" in the last 8760 hours. HbA1C: No results for input(s): "HGBA1C" in the last 72 hours. CBG: No results for input(s): "GLUCAP" in the last 168 hours. Lipid Profile: No results for input(s): "CHOL", "HDL", "LDLCALC", "TRIG", "CHOLHDL", "LDLDIRECT" in the last 72 hours. Thyroid Function Tests: No results for input(s): "TSH", "T4TOTAL", "FREET4", "T3FREE", "THYROIDAB" in the last 72 hours. Anemia Panel: No results for input(s): "VITAMINB12", "FOLATE", "FERRITIN", "TIBC", "IRON", "RETICCTPCT" in the last 72 hours. Sepsis Labs: Recent Labs  Lab 02/10/22 2237 02/11/22 0135  LATICACIDVEN 1.2 0.8    Recent Results (from the past 240 hour(s))  MRSA Next Gen by PCR, Nasal     Status: None   Collection Time: 02/11/22  8:27 PM   Specimen: Nasal Mucosa; Nasal Swab  Result Value Ref Range Status   MRSA by PCR Next Gen NOT DETECTED NOT DETECTED Final    Comment: (NOTE) The GeneXpert MRSA Assay (FDA approved for NASAL specimens only), is one component of a comprehensive MRSA colonization  surveillance program. It is not intended to diagnose MRSA infection nor to guide or monitor treatment for MRSA infections. Test performance is not FDA approved in patients less than 73 years old. Performed at [facility], [address] [place], [place] [address]          Radiology Studies: CT ANGIO GI BLEED  Result Date: 02/11/2022 CLINICAL DATA:  Dark tarry stools x4 days. EXAM: CTA ABDOMEN AND PELVIS WITHOUT AND WITH CONTRAST TECHNIQUE: Multidetector CT imaging of the abdomen and pelvis was performed using the standard protocol during bolus administration of intravenous contrast. Multiplanar reconstructed images and MIPs were obtained and reviewed to evaluate the vascular anatomy. RADIATION DOSE REDUCTION: This exam was performed according to the departmental dose-optimization program which includes automated exposure control, adjustment of the mA and/or kV according to patient size and/or use of iterative reconstruction technique. CONTRAST:  57m OMNIPAQUE IOHEXOL 350 MG/ML SOLN COMPARISON:  November 19, 2021 FINDINGS: VASCULAR Aorta: Marked severity calcification and atherosclerosis without aneurysm, dissection, vasculitis or significant stenosis. Celiac: Moderate severity calcification without evidence of aneurysm, dissection, vasculitis or significant stenosis. SMA: Moderate severity calcification without evidence of aneurysm, dissection,  vasculitis or significant stenosis. Renals: There is marked severity calcification of the origin of the right renal artery with subsequent high-grade stenosis. Moderate to marked severity calcification of the origin of the left renal artery is also seen with mild to moderate severity subsequent stenosis. IMA: Marked severity calcification without evidence of aneurysm, dissection, vasculitis or significant stenosis. Inflow: Moderate severity calcification without evidence of aneurysm, dissection, vasculitis or significant stenosis. Proximal Outflow:  Bilateral common femoral and visualized portions of the superficial and profunda femoral arteries are patent without evidence of aneurysm, dissection, vasculitis or significant stenosis. Veins: No obvious venous abnormality within the limitations of this arterial phase study. Review of the MIP images confirms the above findings. NON-VASCULAR Lower chest: Mild to moderate severity bibasilar atelectasis and/or infiltrate is seen. Hepatobiliary: A small, stable coarse parenchymal calcification is seen within the right lobe of the liver. No gallstones, gallbladder wall thickening, or biliary dilatation. Pancreas: Unremarkable. No pancreatic ductal dilatation or surrounding inflammatory changes. Spleen: Normal in size without focal abnormality. Adrenals/Urinary Tract: Adrenal glands are unremarkable. There is diffuse bilateral renal cortical thinning. Innumerable bilateral partially calcified and noncalcified cysts of various sizes are seen within both kidneys. This is seen on the prior study. The largest cystic appearing area within the left kidney now contains a moderate to marked amount of heterogeneous attenuation (approximately 66.14 Hounsfield units). Moderate severity left-sided perinephric inflammatory fat stranding is also seen. This also represents a new finding. No renal calculi are clearly identified. The urinary bladder is poorly distended and subsequently limited in evaluation. Stomach/Bowel: There is a small hiatal hernia. The appendix is surgically absent. No evidence of bowel dilatation noninflamed diverticula are seen throughout the sigmoid colon. Lymphatic: No abnormal abdominal or pelvic lymph nodes are identified. Reproductive: Status post hysterectomy. No adnexal masses. Other: A stable 4.3 cm x 2.2 cm fat containing ventral hernia is seen along the anterior aspect of the mid abdomen, to the right of midline. A stable 5.2 cm x 2.8 cm fat containing left inguinal hernia is seen. No abdominopelvic  ascites. Coarse mesenteric calcifications are seen along the midline of the lower abdomen. This area is seen within the mid to lower right abdomen on the prior study. Musculoskeletal: Degenerative changes are seen throughout the lumbar spine with additional findings consistent with renal osteodystrophy. IMPRESSION: 1. Chronic renal atrophy and extensive cystic replacement of both kidneys with a moderate amount of hemorrhagic material suspected within the largest cysts on the left. Correlation with MRI is recommended for improved characterization of this region. 2. Given the degree of left-sided perinephric inflammatory fat stranding, sequelae associated with acute pyelonephritis cannot be excluded. 3. 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 4. Sigmoid diverticulosis. 5. Coarse mesenteric calcification which is stable in severity when compared to the prior exam. 6. Mild to moderate severity bibasilar atelectasis and/or infiltrate. 7. Aortic atherosclerosis. Aortic Atherosclerosis (ICD10-I70.0). Electronically Signed   By: Virgina Norfolk M.D.   On: 02/11/2022 00:16        Scheduled Meds:  amLODipine  5 mg Oral Daily   aspirin EC  81 mg Oral Daily   atorvastatin  80 mg Oral QHS   fluticasone  2 spray Each Nare Daily   gabapentin  300 mg Oral TID   hydroxychloroquine  200 mg Oral Daily   melatonin  5 mg Oral QHS   midodrine  10 mg Oral TID WC  polyethylene glycol  17 g Oral BID   sevelamer carbonate  2,400 mg Oral TID WC   Continuous Infusions:   LOS: 1 day    Time spent: 35 minutes    Barb Merino, MD [facility] Pager [phone]

## 2022-02-12 NOTE — Progress Notes (Signed)
[facility]  ROUNDING NOTE   Subjective:   Cassandra Joseph is a 76-year-old female with past medical history including hypertension, multiple myeloma, anemia, and end-stage renal disease on hemodialysis.  Patient presents to the emergency department complaining of blood in stool.  Patient has been admitted for Melena [K92.1] Rectal bleeding [K62.5] Anticoagulated [Z79.01] Gastrointestinal hemorrhage, unspecified gastrointestinal hemorrhage type [K92.2]  Patient is known to our practice from previous admissions and receives outpatient dialysis treatments at [facility] on a MWF schedule, supervised by [facility] physicians.  Patient states she is has intermittent dark stools.  States is usually not consistent.  Patient reports 1 instance of spitting up blood when coughing.  She states this concerned herself and her daughter, whom she lives with.  Patient does have history of hemorrhoids and complains of recent constipation.  Labs on ED arrival not concerning for renal patient.  Hemoglobin currently 9.1, recent CBC from 12/21/2021 shows hemoglobin 9.8.  Nephrology was consulted for comanagement of dialysis patient Patient was seen today on second floor Patient resting comfortably in the bed. Patient offers no new specific physical complaints  Objective:  Vital signs in last 24 hours:  Temp:  [97 F (36.1 C)-97.9 F (36.6 C)] 97.9 F (36.6 C) (07/08 0434) Pulse Rate:  [57-105] 67 (07/08 0434) Resp:  [12-25] 18 (07/08 0434) BP: (126-160)/(45-126) 138/74 (07/08 0434) SpO2:  [84 %-100 %] 98 % (07/08 0434) Weight:  [108.6 kg-109.1 kg] 108.6 kg (07/07 1630)  Weight change: -8.835 kg Filed Weights   02/10/22 1542 02/11/22 1251 02/11/22 1630  Weight: 117.9 kg 109.1 kg 108.6 kg    Intake/Output: I/O last 3 completed shifts: In: -  Out: 502 [Other:502]   Intake/Output this shift:  Total I/O In: 180 [P.O.:180] Out: -   Physical Exam: General: NAD, resting comfortably   Head: Normocephalic, atraumatic. Moist oral mucosal membranes  Eyes: Anicteric  Lungs:  Clear to auscultation, normal effort, [place] O2  Heart: Regular rate and rhythm  Abdomen:  Soft, nontender, obese  Extremities: 1+ peripheral edema.  Neurologic: Nonfocal, moving all four extremities  Skin: No lesions  Access: Left aVF    Basic Metabolic Panel: Recent Labs  Lab 02/10/22 1453  NA 136  K 4.0  CL 94*  CO2 31  GLUCOSE 93  BUN 29*  CREATININE 4.99*  CALCIUM 9.0    Liver Function Tests: Recent Labs  Lab 02/10/22 1453  AST 17  ALT 15  ALKPHOS 75  BILITOT 1.0  PROT 8.1  ALBUMIN 3.6   Recent Labs  Lab 02/10/22 2353  LIPASE 36   No results for input(s): "AMMONIA" in the last 168 hours.  CBC: Recent Labs  Lab 02/10/22 1453  WBC 6.7  HGB 9.1*  HCT 29.1*  MCV 97.3  PLT 232    Cardiac Enzymes: No results for input(s): "CKTOTAL", "CKMB", "CKMBINDEX", "TROPONINI" in the last 168 hours.  BNP: Invalid input(s): "POCBNP"  CBG: No results for input(s): "GLUCAP" in the last 168 hours.  Microbiology: Results for orders placed or performed during the hospital encounter of 02/10/22  MRSA Next Gen by PCR, Nasal     Status: None   Collection Time: 02/11/22  8:27 PM   Specimen: Nasal Mucosa; Nasal Swab  Result Value Ref Range Status   MRSA by PCR Next Gen NOT DETECTED NOT DETECTED Final    Comment: (NOTE) The GeneXpert MRSA Assay (FDA approved for NASAL specimens only), is one component of a comprehensive MRSA colonization surveillance program. It is   not intended to diagnose MRSA infection nor to guide or monitor treatment for MRSA infections. Test performance is not FDA approved in patients less than 10 years old. Performed at [facility], [address] [place], [place] [address]     Coagulation Studies: No results for input(s): "LABPROT", "INR" in the last 72 hours.  Urinalysis: No results for input(s): "COLORURINE", "LABSPEC", "PHURINE",  "GLUCOSEU", "HGBUR", "BILIRUBINUR", "KETONESUR", "PROTEINUR", "UROBILINOGEN", "NITRITE", "LEUKOCYTESUR" in the last 72 hours.  Invalid input(s): "APPERANCEUR"    Imaging: CT ANGIO GI BLEED  Result Date: 02/11/2022 CLINICAL DATA:  Dark tarry stools x4 days. EXAM: CTA ABDOMEN AND PELVIS WITHOUT AND WITH CONTRAST TECHNIQUE: Multidetector CT imaging of the abdomen and pelvis was performed using the standard protocol during bolus administration of intravenous contrast. Multiplanar reconstructed images and MIPs were obtained and reviewed to evaluate the vascular anatomy. RADIATION DOSE REDUCTION: This exam was performed according to the departmental dose-optimization program which includes automated exposure control, adjustment of the mA and/or kV according to patient size and/or use of iterative reconstruction technique. CONTRAST:  49m OMNIPAQUE IOHEXOL 350 MG/ML SOLN COMPARISON:  November 19, 2021 FINDINGS: VASCULAR Aorta: Marked severity calcification and atherosclerosis without aneurysm, dissection, vasculitis or significant stenosis. Celiac: Moderate severity calcification without evidence of aneurysm, dissection, vasculitis or significant stenosis. SMA: Moderate severity calcification without evidence of aneurysm, dissection, vasculitis or significant stenosis. Renals: There is marked severity calcification of the origin of the right renal artery with subsequent high-grade stenosis. Moderate to marked severity calcification of the origin of the left renal artery is also seen with mild to moderate severity subsequent stenosis. IMA: Marked severity calcification without evidence of aneurysm, dissection, vasculitis or significant stenosis. Inflow: Moderate severity calcification without evidence of aneurysm, dissection, vasculitis or significant stenosis. Proximal Outflow: Bilateral common femoral and visualized portions of the superficial and profunda femoral arteries are patent without evidence of aneurysm,  dissection, vasculitis or significant stenosis. Veins: No obvious venous abnormality within the limitations of this arterial phase study. Review of the MIP images confirms the above findings. NON-VASCULAR Lower chest: Mild to moderate severity bibasilar atelectasis and/or infiltrate is seen. Hepatobiliary: A small, stable coarse parenchymal calcification is seen within the right lobe of the liver. No gallstones, gallbladder wall thickening, or biliary dilatation. Pancreas: Unremarkable. No pancreatic ductal dilatation or surrounding inflammatory changes. Spleen: Normal in size without focal abnormality. Adrenals/Urinary Tract: Adrenal glands are unremarkable. There is diffuse bilateral renal cortical thinning. Innumerable bilateral partially calcified and noncalcified cysts of various sizes are seen within both kidneys. This is seen on the prior study. The largest cystic appearing area within the left kidney now contains a moderate to marked amount of heterogeneous attenuation (approximately 66.14 Hounsfield units). Moderate severity left-sided perinephric inflammatory fat stranding is also seen. This also represents a new finding. No renal calculi are clearly identified. The urinary bladder is poorly distended and subsequently limited in evaluation. Stomach/Bowel: There is a small hiatal hernia. The appendix is surgically absent. No evidence of bowel dilatation noninflamed diverticula are seen throughout the sigmoid colon. Lymphatic: No abnormal abdominal or pelvic lymph nodes are identified. Reproductive: Status post hysterectomy. No adnexal masses. Other: A stable 4.3 cm x 2.2 cm fat containing ventral hernia is seen along the anterior aspect of the mid abdomen, to the right of midline. A stable 5.2 cm x 2.8 cm fat containing left inguinal hernia is seen. No abdominopelvic ascites. Coarse mesenteric calcifications are seen along the midline of the lower abdomen. This area is seen within  the mid to lower right  abdomen on the prior study. Musculoskeletal: Degenerative changes are seen throughout the lumbar spine with additional findings consistent with renal osteodystrophy. IMPRESSION: 1. Chronic renal atrophy and extensive cystic replacement of both kidneys with a moderate amount of hemorrhagic material suspected within the largest cysts on the left. Correlation with MRI is recommended for improved characterization of this region. 2. Given the degree of left-sided perinephric inflammatory fat stranding, sequelae associated with acute pyelonephritis cannot be excluded. 3. 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 4. Sigmoid diverticulosis. 5. Coarse mesenteric calcification which is stable in severity when compared to the prior exam. 6. Mild to moderate severity bibasilar atelectasis and/or infiltrate. 7. Aortic atherosclerosis. Aortic Atherosclerosis (ICD10-I70.0). Electronically Signed   By: Virgina Norfolk M.D.   On: 02/11/2022 00:16     Medications:     amLODipine  5 mg Oral Daily   aspirin EC  81 mg Oral Daily   atorvastatin  80 mg Oral QHS   fluticasone  2 spray Each Nare Daily   gabapentin  300 mg Oral TID   hydroxychloroquine  200 mg Oral Daily   melatonin  5 mg Oral QHS   midodrine  10 mg Oral TID WC   polyethylene glycol  17 g Oral BID   sevelamer carbonate  2,400 mg Oral TID WC   acetaminophen **OR** acetaminophen, hydrALAZINE, ondansetron **OR** ondansetron (ZOFRAN) IV  Assessment/ Plan:  Cassandra Joseph is a 76 y.o.  female  with  hypertension, anemia, multiple myeloma, chronic respiratory failure on home O2, and end stage renal disease on dialysis  was admitted for Melena [K92.1] Rectal bleeding [K62.5] Anticoagulated [Z79.01] Gastrointestinal hemorrhage, unspecified gastrointestinal hemorrhage type [K92.2]   [facility]  [place]/MWF/Rt lower forearm AVF  Melena/anemia with chronic kidney disease.   Patient last hemoglobin was stable at 9.1.   Patient is being followed by primary team and  GI team   2.  End-stage renal disease on hemodialysis.   Patient was last dialyzed yesterday No need for renal placement therapy today   3. Secondary Hyperparathyroidism:  Lab Results  Component Value Date   PTH 242 (H) 09/29/2020   CALCIUM 9.0 02/10/2022   PHOS 4.2 05/18/2021    Calcium remains within acceptable range.  We will continue to monitor.  Continue sevelamer with meals.  4.  Hypertension with chronic kidney disease.  Home regimen includes amlodipine, midodrine and hydrochlorothiazide.  Currently prescribed these medications.    LOS: 1 Dason Mosley s [facility] 7/8/20236:04 AM

## 2022-02-13 ENCOUNTER — Inpatient Hospital Stay: Payer: Medicare Other

## 2022-02-13 DIAGNOSIS — K921 Melena: Secondary | ICD-10-CM | POA: Diagnosis not present

## 2022-02-13 DIAGNOSIS — R0603 Acute respiratory distress: Secondary | ICD-10-CM

## 2022-02-13 DIAGNOSIS — I4891 Unspecified atrial fibrillation: Secondary | ICD-10-CM

## 2022-02-13 LAB — CBC
HCT: 30.1 % — ABNORMAL LOW (ref 36.0–46.0)
Hemoglobin: 9.2 g/dL — ABNORMAL LOW (ref 12.0–15.0)
MCH: 30.1 pg (ref 26.0–34.0)
MCHC: 30.6 g/dL (ref 30.0–36.0)
MCV: 98.4 fL (ref 80.0–100.0)
Platelets: 281 10*3/uL (ref 150–400)
RBC: 3.06 MIL/uL — ABNORMAL LOW (ref 3.87–5.11)
RDW: 15.2 % (ref 11.5–15.5)
WBC: 13.5 10*3/uL — ABNORMAL HIGH (ref 4.0–10.5)
nRBC: 0 % (ref 0.0–0.2)

## 2022-02-13 LAB — RENAL FUNCTION PANEL
Albumin: 3.8 g/dL (ref 3.5–5.0)
Anion gap: 14 (ref 5–15)
BUN: 36 mg/dL — ABNORMAL HIGH (ref 8–23)
CO2: 27 mmol/L (ref 22–32)
Calcium: 9.3 mg/dL (ref 8.9–10.3)
Chloride: 97 mmol/L — ABNORMAL LOW (ref 98–111)
Creatinine, Ser: 5.86 mg/dL — ABNORMAL HIGH (ref 0.44–1.00)
GFR, Estimated: 7 mL/min — ABNORMAL LOW (ref >60)
Glucose, Bld: 109 mg/dL — ABNORMAL HIGH (ref 70–99)
Phosphorus: 4 mg/dL (ref 2.5–4.6)
Potassium: 4.9 mmol/L (ref 3.5–5.1)
Sodium: 138 mmol/L (ref 135–145)

## 2022-02-13 LAB — TYPE AND SCREEN
ABO/RH(D): O POS
Antibody Screen: NEGATIVE

## 2022-02-13 LAB — BRAIN NATRIURETIC PEPTIDE: B Natriuretic Peptide: 1212.8 pg/mL — ABNORMAL HIGH (ref 0.0–100.0)

## 2022-02-13 LAB — TROPONIN I (HIGH SENSITIVITY)
Troponin I (High Sensitivity): 17 ng/L (ref <18)
Troponin I (High Sensitivity): 56 ng/L — ABNORMAL HIGH (ref <18)

## 2022-02-13 LAB — HEMOGLOBIN AND HEMATOCRIT, BLOOD
HCT: 24.8 % — ABNORMAL LOW (ref 36.0–46.0)
Hemoglobin: 7.8 g/dL — ABNORMAL LOW (ref 12.0–15.0)

## 2022-02-13 LAB — GLUCOSE, CAPILLARY: Glucose-Capillary: 107 mg/dL — ABNORMAL HIGH (ref 70–99)

## 2022-02-13 MED ORDER — CHLORHEXIDINE GLUCONATE CLOTH 2 % EX PADS
6.0000 | MEDICATED_PAD | Freq: Every day | CUTANEOUS | Status: DC
Start: 2022-02-13 — End: 2022-02-22
  Administered 2022-02-13 – 2022-02-20 (×7): 6 via TOPICAL

## 2022-02-13 MED ORDER — LACTULOSE 10 GM/15ML PO SOLN
30.0000 g | Freq: Two times a day (BID) | ORAL | Status: DC
  Administered 2022-02-13 – 2022-02-20 (×8): 30 g via ORAL
  Filled 2022-02-13 (×14): qty 60

## 2022-02-13 MED ORDER — FUROSEMIDE 10 MG/ML IJ SOLN
40.0000 mg | Freq: Once | INTRAMUSCULAR | Status: AC
  Administered 2022-02-13: 40 mg via INTRAVENOUS
  Filled 2022-02-13: qty 4

## 2022-02-13 MED ORDER — NITROGLYCERIN 2 % TD OINT
1.0000 [in_us] | TOPICAL_OINTMENT | Freq: Four times a day (QID) | TRANSDERMAL | Status: DC
  Administered 2022-02-13 – 2022-02-21 (×28): 1 [in_us] via TOPICAL
  Filled 2022-02-13 (×33): qty 1

## 2022-02-13 NOTE — TOC Progression Note (Signed)
Transition of Care [facility]) - Progression Note    Patient Details  Name: Cassandra Joseph MRN: [id number] Date of Birth: [birth year]  [facility]) CM/SW Contact  Izola Price, RN Phone Number: 02/13/2022, 1:39 PM  Clinical Narrative: 7/9: Patient is active with [facility] for RN per Ace Gins RN CM            Expected Discharge Plan and Services                                                 Social Determinants of Health (SDOH) Interventions    Readmission Risk Interventions    05/21/2021   12:18 PM 08/11/2020    2:01 PM 06/25/2019    3:08 PM  Readmission Risk Prevention Plan  Transportation Screening Complete Complete Complete  PCP or Specialist Appt within 3-5 Days  Complete Complete  HRI or [place]  Complete Complete  Social Work Consult for [place] Planning/Counseling  Complete Complete  Palliative Care Screening  Not Applicable Not Applicable  Medication Review [employer]) Complete Complete Complete  PCP or Specialist appointment within 3-5 days of discharge Complete    HRI or [place] Complete    SW Recovery Care/Counseling Consult Complete    [place] Not Applicable

## 2022-02-13 NOTE — Progress Notes (Addendum)
[facility]  ROUNDING NOTE   Subjective:   Cassandra Joseph is a 76 year old female with past medical history including hypertension, multiple myeloma, anemia, and end-stage renal disease on hemodialysis.  Patient presents to the emergency department complaining of blood in stool.  Patient has been admitted for Melena [K92.1] Rectal bleeding [K62.5] Anticoagulated [Z79.01] Gastrointestinal hemorrhage, unspecified gastrointestinal hemorrhage type [K92.2]  Patient is known to our practice from previous admissions and receives outpatient dialysis treatments at [facility] on a MWF schedule, supervised by [facility] physicians.  Patient states she is has intermittent dark stools.  States is usually not consistent.  Patient reports 1 instance of spitting up blood when coughing.  She states this concerned herself and her daughter, whom she lives with.  Patient does have history of hemorrhoids and complains of recent constipation.  Labs on ED arrival not concerning for renal patient.  Hemoglobin currently 9.1, recent CBC from 12/21/2021 shows hemoglobin 9.8.  Nephrology was consulted for comanagement of dialysis patient Patient was seen today on second floor Patient resting comfortably in the bed. Patient offers no new specific physical complaints  Objective:  Vital signs in last 24 hours:  Temp:  [97.3 F (36.3 C)-98.5 F (36.9 C)] 98.4 F (36.9 C) (07/09 0519) Pulse Rate:  [65-69] 69 (07/09 0519) Resp:  [18-19] 19 (07/09 0519) BP: (122-147)/(30-60) 123/58 (07/09 0519) SpO2:  [95 %-100 %] 100 % (07/09 0519)  Weight change:  Filed Weights   02/10/22 1542 02/11/22 1251 02/11/22 1630  Weight: 117.9 kg 109.1 kg 108.6 kg    Intake/Output: I/O last 3 completed shifts: In: 180 [P.O.:180] Out: 502 [Other:502]   Intake/Output this shift:  No intake/output data recorded.  Physical Exam: General: In respiratory distress,  Head: Normocephalic, atraumatic. Moist oral mucosal  membranes  Eyes: Anicteric  Lungs:  Decreased breath sounds, BiPAP in situ, crackles  Heart: Regular rate and rhythm  Abdomen:  Soft, nontender, obese  Extremities: 1+ peripheral edema.  Neurologic: Nonfocal, moving all four extremities  Skin: No lesions  Access: Left aVF    Basic Metabolic Panel: Recent Labs  Lab 02/10/22 1453  NA 136  K 4.0  CL 94*  CO2 31  GLUCOSE 93  BUN 29*  CREATININE 4.99*  CALCIUM 9.0    Liver Function Tests: Recent Labs  Lab 02/10/22 1453  AST 17  ALT 15  ALKPHOS 75  BILITOT 1.0  PROT 8.1  ALBUMIN 3.6   Recent Labs  Lab 02/10/22 2353  LIPASE 36   No results for input(s): "AMMONIA" in the last 168 hours.  CBC: Recent Labs  Lab 02/10/22 1453 02/12/22 1638 02/13/22 0530  WBC 6.7  --   --   HGB 9.1* 7.9* 7.8*  HCT 29.1* 25.1* 24.8*  MCV 97.3  --   --   PLT 232  --   --     Cardiac Enzymes: No results for input(s): "CKTOTAL", "CKMB", "CKMBINDEX", "TROPONINI" in the last 168 hours.  BNP: Invalid input(s): "POCBNP"  CBG: No results for input(s): "GLUCAP" in the last 168 hours.  Microbiology: Results for orders placed or performed during the hospital encounter of 02/10/22  MRSA Next Gen by PCR, Nasal     Status: None   Collection Time: 02/11/22  8:27 PM   Specimen: Nasal Mucosa; Nasal Swab  Result Value Ref Range Status   MRSA by PCR Next Gen NOT DETECTED NOT DETECTED Final    Comment: (NOTE) The GeneXpert MRSA Assay (FDA approved for NASAL specimens only),  is one component of a comprehensive MRSA colonization surveillance program. It is not intended to diagnose MRSA infection nor to guide or monitor treatment for MRSA infections. Test performance is not FDA approved in patients less than 53 years old. Performed at [facility], [address] [place], [place] [address]     Coagulation Studies: No results for input(s): "LABPROT", "INR" in the last 72 hours.  Urinalysis: No results for input(s):  "COLORURINE", "LABSPEC", "PHURINE", "GLUCOSEU", "HGBUR", "BILIRUBINUR", "KETONESUR", "PROTEINUR", "UROBILINOGEN", "NITRITE", "LEUKOCYTESUR" in the last 72 hours.  Invalid input(s): "APPERANCEUR"    Imaging: No results found.   Medications:     amLODipine  5 mg Oral Daily   aspirin EC  81 mg Oral Daily   atorvastatin  80 mg Oral QHS   fluticasone  2 spray Each Nare Daily   gabapentin  300 mg Oral TID   hydroxychloroquine  200 mg Oral Daily   melatonin  5 mg Oral QHS   midodrine  10 mg Oral TID WC   sevelamer carbonate  2,400 mg Oral TID WC   acetaminophen **OR** acetaminophen, hydrALAZINE, ondansetron **OR** ondansetron (ZOFRAN) IV  Assessment/ Plan:  Cassandra Joseph is a 76 y.o.  female  with  hypertension, anemia, multiple myeloma, chronic respiratory failure on home O2, and end stage renal disease on dialysis  was admitted for Melena [K92.1] Rectal bleeding [K62.5] Anticoagulated [Z79.01] Gastrointestinal hemorrhage, unspecified gastrointestinal hemorrhage type [K92.2]   [facility]/MWF/Rt lower forearm AVF  Melena/anemia with chronic kidney disease.   Patient last hemoglobin low at  8.0 Patient is being followed by primary team and  GI team   2.  End-stage renal disease on hemodialysis.   Patient was last dialyzed on Friday  No need for renal placement therapy today We will dialyze patient tomorrow   3. Secondary Hyperparathyroidism:  Lab Results  Component Value Date   PTH 242 (H) 09/29/2020   CALCIUM 9.0 02/10/2022   PHOS 4.2 05/18/2021    Calcium remains within acceptable range.  We will continue to monitor.  Continue sevelamer with meals.  4.  Hypertension with chronic kidney disease.  Home regimen includes amlodipine, midodrine and hydrochlorothiazide.  Currently prescribed these medications.   Addendum Patient later developed flash pulm edema Patient was having shortness of breath Patient was having hypoxia Patient was later  transferred to ICU and started on BiPAP.  I asked for basic labs.  I reviewed patient's chest x-ray. Patient is 76 year old pulmonary edema Patient has developed acute respiratory distress with fluid overload Patient will need emergent dialysis I then prepared the orders/discussed with the team/reach out to the dialysis nurse. We will dialyze patient tonight   LOS: 2 Carlosdaniel Grob s [facility] 7/9/20236:24 AM

## 2022-02-13 NOTE — Progress Notes (Signed)
Patient received to ICU room 12 s/p rapid response, accompanied by charge RN and care RN. Report received from care RN. Patient was alert and oriented, complaining of SOB, visibly in distress, attempting to sit up in bed. Sats low to mid 80's on O2 at 4LPM via [place]. Patient converted to 15 L HFNC and then to Bipap with improved sats and patient comfort. She was given IV lasix as ordered, via left AC PIV. Also given 1 inch NTP to anterior chest. 12 lead EKG completed. Dr. Posey Pronto at bedside, and reviewed EKG. Labs ordered and drawn by phlebotomy. Family to bedside. Plan is for HD tonight per Dr. Posey Pronto. Patient and family were updated regarding plan.

## 2022-02-13 NOTE — Progress Notes (Addendum)
[facility] PROGRESS NOTE  Patient: Cassandra Joseph EAV:[id number]   PCP: Tracie Harrier, MD DOB: 11-22-1945   DOA: 02/10/2022   DOS: 02/13/2022    Subjective: I was asked to evaluate this patient at bedside due to respiratory distress.  ESRD patient on HD presented to the hospital with complaints of lower GI bleed. Patient was having shortness of breath as the day progressed.  Later in the day she had a large bowel movement and right after the bowel movement she started having worsening her shortness of breath.  She was already being in the process of transferring to the stepdown unit. At the time of my evaluation patient denies any complaints of chest pain or abdominal pain.  No nausea no vomiting.  While the patient earlier reported to the RN that she may have something stuck in her throat the iron did not note any aspiration events themselves. Patient is able to follow commands.  Objective:  Vitals:   02/13/22 1824 02/13/22 1832  BP: (!) 164/94 (!) 194/107  Pulse: (!) 121 (!) 102  Resp: (!) 23 (!) 25  Temp:    SpO2: 92% 97%    General: Appear in severe distress, no Rash; Oral Mucosa Clear, moist. no Abnormal Neck Mass Or lumps, Conjunctiva normal  Cardiovascular: S1 and S2 Present, no Murmur, Respiratory: increased respiratory effort, Bilateral Air entry present and bilateral Crackles, no wheezes Abdomen: Bowel Sound present, Soft and no tenderness Extremities: trace Pedal edema Neurology: alert and oriented to time, place, and person affect appropriate. no new focal deficit Gait not checked due to patient safety concerns    Assessment and plan: Acute hypoxic respiratory failure. Currently 82% on 6 L of oxygen.  Respiratory rate in 40s. Tachycardic as well. Suspect this is actually flash pulmonary edema based on the chest x-ray findings. We will place the patient on BiPAP for respiratory distress. Check ABG 1 hour after. IV Lasix, nitroglycerin ointment. Currently we  will keep n.p.o. Speech evaluation in the morning. I do not suspect there is evidence of aspiration for now. Patient appears to be actually better after being on the BiPAP for 15 minutes. EKG shows A-fib although I can see P waves marching through the EKG. No evidence of ST elevation. We will check troponins. Check CBC. Will hold Midodrine. Discussed with nephrology, they will attempt to dialyze the patient overnight.  Author: Berle Mull, MD [facility] 02/13/2022 6:50 PM   If 7PM-7AM, please contact night-coverage at [email or web address]

## 2022-02-13 NOTE — Progress Notes (Signed)
PROGRESS NOTE    Cassandra Joseph  ZCH:[id number] DOB: Dec 30, 1945 DOA: 02/10/2022 PCP: Tracie Harrier, MD    Brief Narrative:  76 year old with extensive cardiovascular morbidity, hypertension, chronic anemia, paroxysmal atrial fibrillation, ESRD on hemodialysis, history of hemorrhoids brought to the emergency room noticing blood in her diapers by her daughter at home.  Patient also complained of 1 episode of spitting blood while coughing.  Is bedbound.  She is on aspirin and Eliquis both.  Denies any abdominal pain.  In the emergency room hemoglobin is stable.  FOBT positive but no fresh bleeding. Admitted with GI consultation.  Patient is constipated. Due to her extensive medical issues and difficulty with bowel prep, patient decided to be conservatively managed.  Multiple laxatives with no meaningful bowel movement.  We will try higher dose of lactulose today.   Assessment & Plan:   Lower GI bleeding: Fairly stable today.  Hemoglobin with some drop but not needing any transfusions today.  Does have a history of prolapsed hemorrhoids. Patient has significant history of constipation, this could be bleeding from hemorrhoids or diverticular bleed.  Also possible outlet bleeding.  Coagulopathy on aspirin and Eliquis. Bleeding scan on presentation without evidence of ongoing bleeding.  Negative angiogram. GI consulted, risk versus benefits were discussed and patient would like to postpone colonoscopy. No BM with MiraLAX.  Lactulose did have some effect. Lactulose 30 g twice daily today. Aggressive bowel regimen on discharge.  ESRD and hemodialysis: Receiving dialysis on her schedule.  [facility] [employer].  Personal A-fib: Rate controlled.  Eliquis on hold.  Currently no indication to bridge with heparin.  Aspirin continued.  Will resume Eliquis if no more bleeding.  OSA on CPAP: Using CPAP at home settings.  Chronic pain issues: On gabapentin and Plaquenil. Patient is on high-dose of  gabapentin for ESRD.    Essential hypertension: Patient on amlodipine at home.  She is also on midodrine to boost her blood pressures.  I will defer to nephrology to adjust her medications.  Probably she will need midodrine during dialysis.   DVT prophylaxis: SCDs Start: 02/10/22 2008   Code Status: Full code Family Communication: Daughter Cassandra Joseph on the phone. Disposition Plan: Status is: Inpatient Remains inpatient appropriate because: Significant underlying illness, ongoing lower GI bleeding.     Consultants:  Gastroenterology Nephrology  Procedures:  None  Antimicrobials:  None   Subjective:  Patient seen and examined.  She had very small formed bowel movement today morning that was examined by this provider at the bedside.  There was no bleeding.  Patient complained of lower abdominal pain and discomfort but no nausea or vomiting.  We will try more laxatives today. "I will never go for colonoscopy"  Objective: Vitals:   02/12/22 1551 02/12/22 2052 02/13/22 0519 02/13/22 0738  BP: (!) 126/30 (!) 147/60 (!) 123/58 (!) 123/58  Pulse: 67 65 69 68  Resp: '18 18 19 18  '$ Temp: (!) 97.3 F (36.3 C) 98.5 F (36.9 C) 98.4 F (36.9 C) 98.8 F (37.1 C)  TempSrc:      SpO2: 99% 95% 100% 91%  Weight:      Height:       No intake or output data in the 24 hours ending 02/13/22 1231  Filed Weights   02/10/22 1542 02/11/22 1251 02/11/22 1630  Weight: 117.9 kg 109.1 kg 108.6 kg    Examination:  General exam: Appears calm and comfortable.  Frail debilitated.  Bedbound. Alert and oriented x4. Respiratory system: Clear to auscultation.  Respiratory effort normal.  On 3 L oxygen. Cardiovascular system: S1 & S2 heard, RRR.  Gastrointestinal system: Soft.  Nontender.  Bowel sound present.   Central nervous system: Alert and oriented. No focal neurological deficits. Extremities: Symmetric 5 x 5 power. Skin: Right upper extremity AV fistula present.  Thrill present.    Data  Reviewed: I have personally reviewed following labs and imaging studies  CBC: Recent Labs  Lab 02/10/22 1453 02/12/22 1638 02/13/22 0530  WBC 6.7  --   --   HGB 9.1* 7.9* 7.8*  HCT 29.1* 25.1* 24.8*  MCV 97.3  --   --   PLT 232  --   --    Basic Metabolic Panel: Recent Labs  Lab 02/10/22 1453  NA 136  K 4.0  CL 94*  CO2 31  GLUCOSE 93  BUN 29*  CREATININE 4.99*  CALCIUM 9.0   GFR: Estimated Creatinine Clearance: 12.1 mL/min (A) (by C-G formula based on SCr of 4.99 mg/dL (H)). Liver Function Tests: Recent Labs  Lab 02/10/22 1453  AST 17  ALT 15  ALKPHOS 75  BILITOT 1.0  PROT 8.1  ALBUMIN 3.6   Recent Labs  Lab 02/10/22 2353  LIPASE 36   No results for input(s): "AMMONIA" in the last 168 hours. Coagulation Profile: No results for input(s): "INR", "PROTIME" in the last 168 hours. Cardiac Enzymes: No results for input(s): "CKTOTAL", "CKMB", "CKMBINDEX", "TROPONINI" in the last 168 hours. BNP (last 3 results) No results for input(s): "PROBNP" in the last 8760 hours. HbA1C: No results for input(s): "HGBA1C" in the last 72 hours. CBG: No results for input(s): "GLUCAP" in the last 168 hours. Lipid Profile: No results for input(s): "CHOL", "HDL", "LDLCALC", "TRIG", "CHOLHDL", "LDLDIRECT" in the last 72 hours. Thyroid Function Tests: No results for input(s): "TSH", "T4TOTAL", "FREET4", "T3FREE", "THYROIDAB" in the last 72 hours. Anemia Panel: No results for input(s): "VITAMINB12", "FOLATE", "FERRITIN", "TIBC", "IRON", "RETICCTPCT" in the last 72 hours. Sepsis Labs: Recent Labs  Lab 02/10/22 2237 02/11/22 0135  LATICACIDVEN 1.2 0.8    Recent Results (from the past 240 hour(s))  MRSA Next Gen by PCR, Nasal     Status: None   Collection Time: 02/11/22  8:27 PM   Specimen: Nasal Mucosa; Nasal Swab  Result Value Ref Range Status   MRSA by PCR Next Gen NOT DETECTED NOT DETECTED Final    Comment: (NOTE) The GeneXpert MRSA Assay (FDA approved for NASAL  specimens only), is one component of a comprehensive MRSA colonization surveillance program. It is not intended to diagnose MRSA infection nor to guide or monitor treatment for MRSA infections. Test performance is not FDA approved in patients less than 26 years old. Performed at [facility], [address], [place], [place] [address]          Radiology Studies: No results found.      Scheduled Meds:  amLODipine  5 mg Oral Daily   aspirin EC  81 mg Oral Daily   atorvastatin  80 mg Oral QHS   fluticasone  2 spray Each Nare Daily   gabapentin  300 mg Oral TID   hydroxychloroquine  200 mg Oral Daily   lactulose  30 g Oral BID   melatonin  5 mg Oral QHS   midodrine  10 mg Oral TID WC   sevelamer carbonate  2,400 mg Oral TID WC   Continuous Infusions:   LOS: 2 days    Time spent: 35 minutes    Barb Merino,  MD [facility] Pager [phone]

## 2022-02-14 DIAGNOSIS — N186 End stage renal disease: Secondary | ICD-10-CM | POA: Diagnosis not present

## 2022-02-14 DIAGNOSIS — Z9989 Dependence on other enabling machines and devices: Secondary | ICD-10-CM

## 2022-02-14 DIAGNOSIS — I10 Essential (primary) hypertension: Secondary | ICD-10-CM | POA: Diagnosis not present

## 2022-02-14 DIAGNOSIS — Z992 Dependence on renal dialysis: Secondary | ICD-10-CM

## 2022-02-14 DIAGNOSIS — K625 Hemorrhage of anus and rectum: Secondary | ICD-10-CM

## 2022-02-14 DIAGNOSIS — R109 Unspecified abdominal pain: Secondary | ICD-10-CM

## 2022-02-14 DIAGNOSIS — K921 Melena: Secondary | ICD-10-CM | POA: Diagnosis not present

## 2022-02-14 DIAGNOSIS — G4733 Obstructive sleep apnea (adult) (pediatric): Secondary | ICD-10-CM

## 2022-02-14 DIAGNOSIS — I480 Paroxysmal atrial fibrillation: Secondary | ICD-10-CM

## 2022-02-14 LAB — TROPONIN I (HIGH SENSITIVITY)
Troponin I (High Sensitivity): 100 ng/L (ref <18)
Troponin I (High Sensitivity): 105 ng/L (ref <18)
Troponin I (High Sensitivity): 89 ng/L — ABNORMAL HIGH (ref <18)

## 2022-02-14 LAB — HEMOGLOBIN AND HEMATOCRIT, BLOOD
HCT: 25.2 % — ABNORMAL LOW (ref 36.0–46.0)
Hemoglobin: 8.1 g/dL — ABNORMAL LOW (ref 12.0–15.0)

## 2022-02-14 LAB — BLOOD GAS, ARTERIAL
Acid-Base Excess: 4.1 mmol/L — ABNORMAL HIGH (ref 0.0–2.0)
Bicarbonate: 27.4 mmol/L (ref 20.0–28.0)
Delivery systems: POSITIVE
Expiratory PAP: 6 cmH2O
FIO2: 50 %
Inspiratory PAP: 12 cmH2O
O2 Saturation: 98.5 %
pCO2 arterial: 36 mmHg (ref 32–48)
pH, Arterial: 7.49 — ABNORMAL HIGH (ref 7.35–7.45)
pO2, Arterial: 155 mmHg — ABNORMAL HIGH (ref 83–108)

## 2022-02-14 MED ORDER — MIDODRINE HCL 5 MG PO TABS
10.0000 mg | ORAL_TABLET | Freq: Three times a day (TID) | ORAL | Status: DC
Start: 2022-02-14 — End: 2022-02-22
  Administered 2022-02-16 – 2022-02-21 (×13): 10 mg via ORAL
  Filled 2022-02-14 (×15): qty 2

## 2022-02-14 NOTE — Progress Notes (Signed)
Hemodialysis Post Treatment Note:  Tx date:02/14/2022 Tx time: 3 hours  Access: right  AVF UF Removed:  1.5 L  Note: HD treatment completed. Tolerated well. NO HD complications noted. Post HD BP  140/81

## 2022-02-14 NOTE — Progress Notes (Signed)
PROGRESS NOTE    JAHNIYA DUZAN  QZR:[id number] DOB: July 30, 1946 DOA: 02/10/2022 PCP: Tracie Harrier, MD    Brief Narrative:  76 year old female with past medical history of hypertension,  chronic anemia, paroxysmal atrial fibrillation, ESRD on hemodialysis, history of hemorrhoids was brought into the hospital for blood in her diapers at home.  Patient is a bedbound baseline is on aspirin and Eliquis.  In the ED hemoglobin was stable but FOBT was positive but no fresh bleeding.  GI was consulted but patient did not wish to undergo colonoscopy or endoscopy evaluation.  Does have a history of constipation and was put on bowel regimen.   During hospitalization, patient had acute respiratory distress and required BiPAP briefly.  Currently borderline hypotensive and midodrine has been reinitiated.  Awaiting for hemodialysis today for volume management.  Assessment & Plan:   Principal Problem:   Melena Active Problems:   Abdominal pain   Anemia   Essential hypertension   ESRD on hemodialysis (HCC)   PAF (paroxysmal atrial fibrillation) (HCC)   OSA on CPAP   Rectal bleeding   Lower GI bleeding:  Has history of hemorrhoids.  Patient does have constipation.  Was on aspirin and Eliquis as outpatient.  Bleeding scan initially without evidence of ongoing bleeding with negative angiogram.  GI was consulted and patient does not wish to undergo colonoscopy.  Continue lactulose and bowel regimen on discharge.    Respiratory distress 02/13/2022.  Had acute on chronic hypoxic respiratory failure and required 6 L of oxygen with saturation of 82% and was tachypneic.  Thought to be secondary to flash pulmonary edema.  Was put on BiPAP and IV Lasix and nitroglycerin was given.  Patient to undergo dialysis 02/14/2022.  Improved today.  ESRD on hemodialysis: Nephrology on board.  Paroxysmal atrial fibrillation.  Rate controlled.  Continue aspirin.  Eliquis currently on hold.  OSA on CPAP: On CPAP at home.   Required BiPAP.  Chronic pain syndrome.  On gabapentin and Plaquenil.  On gabapentin 300 mg 3 times daily.  Essential hypertension: Now borderline hypotension.  On amlodipine.  Was on midodrine which was on hold.  Will resume midodrine.  Nephrology on board.  DVT prophylaxis: SCDs Start: 02/10/22 2008   Code Status: Full code  Family Communication:  None today   Disposition Plan:   Status is: Inpatient  Remains inpatient appropriate because: Lower GI bleed, acute on chronic respiratory failure, respiratory distress, hypotension  Consultants:  Gastroenterology Nephrology  Procedures:  Hemodialysis  Antimicrobials:  None   Subjective:  Today, patient was seen and examined at bedside.  Feels a little better with breathing today.  Required BiPAP and has been weaned off to nasal cannula this morning.  Denies any nausea vomiting or chest pain.  Has baseline bedbound status and is on 24/7 oxygen at home.  Does not ambulate.  Lives with her daughter and granddaughter at home.  Had bright red blood in rectum but continues to refuse colonoscopy.  Objective: Vitals:   02/14/22 0102 02/14/22 0105 02/14/22 0400 02/14/22 0500  BP:      Pulse:  76    Resp:  20    Temp:  98.7 F (37.1 C) 97.8 F (36.6 C)   TempSrc:  Axillary Axillary   SpO2:  100%    Weight: 115.4 kg   109.4 kg  Height:        Intake/Output Summary (Last 24 hours) at 02/14/2022 1024 Last data filed at 02/14/2022 0037 Gross per 24 hour  Intake 100 ml  Output 1000 ml  Net -900 ml    Filed Weights   02/13/22 2054 02/14/22 0102 02/14/22 0500  Weight: 116.1 kg 115.4 kg 109.4 kg    Physical examination: Body mass index is 38.93 kg/m.  General: Obese built, not in obvious distress, on nasal cannula oxygen, appears deconditioned, bedbound on nasal cannula oxygen at 3 L/min HENT:   No scleral pallor or icterus noted. Oral mucosa is moist.  Chest:   Diminished breath sounds bilaterally. CVS: S1 &S2 heard. No  murmur.  Regular rate and rhythm. Abdomen: Soft, nontender, nondistended.  Bowel sounds are heard.   Extremities: No cyanosis, clubbing or edema.  Peripheral pulses are palpable.  Right upper extremity AV fistula with thrill Psych: Alert, awake and oriented, normal mood CNS:  No cranial nerve deficits.  Power equal in all extremities.   Skin: Warm and dry.  No rashes noted.   Data Reviewed: I have personally reviewed following labs and imaging studies  CBC: Recent Labs  Lab 02/10/22 1453 02/12/22 1638 02/13/22 0530 02/13/22 1851 02/14/22 0154  WBC 6.7  --   --  13.5*  --   HGB 9.1* 7.9* 7.8* 9.2* 8.1*  HCT 29.1* 25.1* 24.8* 30.1* 25.2*  MCV 97.3  --   --  98.4  --   PLT 232  --   --  281  --     Basic Metabolic Panel: Recent Labs  Lab 02/10/22 1453 02/13/22 1851  NA 136 138  K 4.0 4.9  CL 94* 97*  CO2 31 27  GLUCOSE 93 109*  BUN 29* 36*  CREATININE 4.99* 5.86*  CALCIUM 9.0 9.3  PHOS  --  4.0    GFR: Estimated Creatinine Clearance: 10.4 mL/min (A) (by C-G formula based on SCr of 5.86 mg/dL (H)). Liver Function Tests: Recent Labs  Lab 02/10/22 1453 02/13/22 1851  AST 17  --   ALT 15  --   ALKPHOS 75  --   BILITOT 1.0  --   PROT 8.1  --   ALBUMIN 3.6 3.8    Recent Labs  Lab 02/10/22 2353  LIPASE 36    No results for input(s): "AMMONIA" in the last 168 hours. Coagulation Profile: No results for input(s): "INR", "PROTIME" in the last 168 hours. Cardiac Enzymes: No results for input(s): "CKTOTAL", "CKMB", "CKMBINDEX", "TROPONINI" in the last 168 hours. BNP (last 3 results) No results for input(s): "PROBNP" in the last 8760 hours. HbA1C: No results for input(s): "HGBA1C" in the last 72 hours. CBG: Recent Labs  Lab 02/13/22 1823  GLUCAP 107*   Lipid Profile: No results for input(s): "CHOL", "HDL", "LDLCALC", "TRIG", "CHOLHDL", "LDLDIRECT" in the last 72 hours. Thyroid Function Tests: No results for input(s): "TSH", "T4TOTAL", "FREET4", "T3FREE",  "THYROIDAB" in the last 72 hours. Anemia Panel: No results for input(s): "VITAMINB12", "FOLATE", "FERRITIN", "TIBC", "IRON", "RETICCTPCT" in the last 72 hours. Sepsis Labs: Recent Labs  Lab 02/10/22 2237 02/11/22 0135  LATICACIDVEN 1.2 0.8     Recent Results (from the past 240 hour(s))  MRSA Next Gen by PCR, Nasal     Status: None   Collection Time: 02/11/22  8:27 PM   Specimen: Nasal Mucosa; Nasal Swab  Result Value Ref Range Status   MRSA by PCR Next Gen NOT DETECTED NOT DETECTED Final    Comment: (NOTE) The GeneXpert MRSA Assay (FDA approved for NASAL specimens only), is one component of a comprehensive MRSA colonization surveillance program. It is not intended to diagnose  MRSA infection nor to guide or monitor treatment for MRSA infections. Test performance is not FDA approved in patients less than 24 years old. Performed at [facility], [address] [place], [place] [address]       Radiology Studies: DG Chest 1 View  Result Date: 02/13/2022 CLINICAL DATA:  Shortness of breath EXAM: CHEST  1 VIEW COMPARISON:  11/19/2021, 08/07/2020 FINDINGS: Chronic elevation of the right diaphragm. Cardiomegaly with vascular congestion and diffuse bilateral interstitial and hazy pulmonary opacity consistent with pulmonary edema. Aortic atherosclerosis. IMPRESSION: Cardiomegaly with vascular congestion and pulmonary edema. Electronically Signed   By: Donavan Foil M.D.   On: 02/13/2022 19:01     Scheduled Meds:  amLODipine  5 mg Oral Daily   aspirin EC  81 mg Oral Daily   atorvastatin  80 mg Oral QHS   Chlorhexidine Gluconate Cloth  6 each Topical Daily   fluticasone  2 spray Each Nare Daily   gabapentin  300 mg Oral TID   hydroxychloroquine  200 mg Oral Daily   lactulose  30 g Oral BID   melatonin  5 mg Oral QHS   nitroGLYCERIN  1 inch Topical Q6H   sevelamer carbonate  2,400 mg Oral TID WC   Continuous Infusions:   LOS: 3 days     Flora Lipps, MD [facility] Pager [phone]

## 2022-02-14 NOTE — Progress Notes (Signed)
CROSS COVER NOTE  NAME: Cassandra Joseph MRN: [id number] DOB : 1945/09/19    Date of Service   02/14/22  HPI/Events of Note   Notified of Troponin result of 57 increased from 17 3 hrs prior.  M(r)s Cassandra Joseph is a 76 year old female who presented to [facility] ED on 02/10/22 with concerns for GI bleeding. PMH hemorrhoids, ESRD on iHD, hypertension, hyperlipidemia, heart failure preserved EF, RA, TIA/CVA, multiple myeloma, OSA on CPAP nightly, and paraoxysmal atrial fibrillation on Eliquis. CT abdomen pelvis without from April 2022 at [facility] shows extensive coronary atherosclerosis.  Earlier this afternoon [name] reported shortness of breath that progressed to respiratory distress and found to be in flash pulmonary edema.  She was placed on BiPAP given Nitropaste, and emergently dialyzed.  No reports of chest pain or abdominal pain thus far. BNP 1212 yesterday evening.  Interventions   Plan: Trend Troponin 17-->56-->89-->105-->100 Will not heparinize at this time 2/2 presentation with melanotic stool and BRBPR per notes.      This document was prepared using Dragon voice recognition software and may include unintentional dictation errors.  Neomia Glass DNP, MHA, FNP-BC Nurse Practitioner [facility] Pager [phone]

## 2022-02-14 NOTE — Progress Notes (Signed)
[facility]  ROUNDING NOTE   Subjective:   Cassandra Joseph is a 76-year-old female with past medical history including hypertension, multiple myeloma, anemia, and end-stage renal disease on hemodialysis.  Patient presents to the emergency department complaining of blood in stool.  Patient has been admitted for Melena [K92.1] Rectal bleeding [K62.5] Anticoagulated [Z79.01] Gastrointestinal hemorrhage, unspecified gastrointestinal hemorrhage type [K92.2]  Patient is known to our practice from previous admissions and receives outpatient dialysis treatments at [facility] on a MWF schedule, supervised by [facility] physicians.    Patient seen at bedside in ICU Alert and oriented Poor appetite but denies nausea and vomiting Shortness of breath remains at rest No lower extremity edema   Objective:  Vital signs in last 24 hours:  Temp:  [97.8 F (36.6 C)-99.8 F (37.7 C)] 98.4 F (36.9 C) (07/10 1112) Pulse Rate:  [67-121] 75 (07/10 1145) Resp:  [13-32] 27 (07/10 1145) BP: (125-194)/(60-107) 147/64 (07/10 1145) SpO2:  [92 %-100 %] 97 % (07/10 1100) FiO2 (%):  [50 %] 50 % (07/09 2055) Weight:  [109.4 kg-117.7 kg] 117.7 kg (07/10 1113)  Weight change:  Filed Weights   02/14/22 0102 02/14/22 0500 02/14/22 1113  Weight: 115.4 kg 109.4 kg 117.7 kg    Intake/Output: I/O last 3 completed shifts: In: 100 [P.O.:100] Out: 1000 [Other:1000]   Intake/Output this shift:  No intake/output data recorded.  Physical Exam: General: In respiratory distress,  Head: Normocephalic, atraumatic. Moist oral mucosal membranes  Eyes: Anicteric  Lungs:  Crackles, mild tachypnea  Heart: Irregular rate and rhythm  Abdomen:  Soft, nontender, obese  Extremities: trace peripheral edema.  Neurologic: Nonfocal, moving all four extremities  Skin: No lesions  Access: Left aVF    Basic Metabolic Panel: Recent Labs  Lab 02/10/22 1453 02/13/22 1851  NA 136 138  K 4.0 4.9  CL 94* 97*   CO2 31 27  GLUCOSE 93 109*  BUN 29* 36*  CREATININE 4.99* 5.86*  CALCIUM 9.0 9.3  PHOS  --  4.0     Liver Function Tests: Recent Labs  Lab 02/10/22 1453 02/13/22 1851  AST 17  --   ALT 15  --   ALKPHOS 75  --   BILITOT 1.0  --   PROT 8.1  --   ALBUMIN 3.6 3.8    Recent Labs  Lab 02/10/22 2353  LIPASE 36    No results for input(s): "AMMONIA" in the last 168 hours.  CBC: Recent Labs  Lab 02/10/22 1453 02/12/22 1638 02/13/22 0530 02/13/22 1851 02/14/22 0154  WBC 6.7  --   --  13.5*  --   HGB 9.1* 7.9* 7.8* 9.2* 8.1*  HCT 29.1* 25.1* 24.8* 30.1* 25.2*  MCV 97.3  --   --  98.4  --   PLT 232  --   --  281  --      Cardiac Enzymes: No results for input(s): "CKTOTAL", "CKMB", "CKMBINDEX", "TROPONINI" in the last 168 hours.  BNP: Invalid input(s): "POCBNP"  CBG: Recent Labs  Lab 02/13/22 1823  GLUCAP 107*    Microbiology: Results for orders placed or performed during the hospital encounter of 02/10/22  MRSA Next Gen by PCR, Nasal     Status: None   Collection Time: 02/11/22  8:27 PM   Specimen: Nasal Mucosa; Nasal Swab  Result Value Ref Range Status   MRSA by PCR Next Gen NOT DETECTED NOT DETECTED Final    Comment: (NOTE) The GeneXpert MRSA Assay (FDA approved for NASAL   specimens only), is one component of a comprehensive MRSA colonization surveillance program. It is not intended to diagnose MRSA infection nor to guide or monitor treatment for MRSA infections. Test performance is not FDA approved in patients less than 2 years old. Performed at [facility], [address] [place], [place] [address]     Coagulation Studies: No results for input(s): "LABPROT", "INR" in the last 72 hours.  Urinalysis: No results for input(s): "COLORURINE", "LABSPEC", "PHURINE", "GLUCOSEU", "HGBUR", "BILIRUBINUR", "KETONESUR", "PROTEINUR", "UROBILINOGEN", "NITRITE", "LEUKOCYTESUR" in the last 72 hours.  Invalid input(s): "APPERANCEUR"     Imaging: DG Chest 1 View  Result Date: 02/13/2022 CLINICAL DATA:  Shortness of breath EXAM: CHEST  1 VIEW COMPARISON:  11/19/2021, 08/07/2020 FINDINGS: Chronic elevation of the right diaphragm. Cardiomegaly with vascular congestion and diffuse bilateral interstitial and hazy pulmonary opacity consistent with pulmonary edema. Aortic atherosclerosis. IMPRESSION: Cardiomegaly with vascular congestion and pulmonary edema. Electronically Signed   By: Kim  Fujinaga M.D.   On: 02/13/2022 19:01     Medications:     amLODipine  5 mg Oral Daily   aspirin EC  81 mg Oral Daily   atorvastatin  80 mg Oral QHS   Chlorhexidine Gluconate Cloth  6 each Topical Daily   fluticasone  2 spray Each Nare Daily   gabapentin  300 mg Oral TID   hydroxychloroquine  200 mg Oral Daily   lactulose  30 g Oral BID   melatonin  5 mg Oral QHS   midodrine  10 mg Oral TID WC   nitroGLYCERIN  1 inch Topical Q6H   sevelamer carbonate  2,400 mg Oral TID WC   acetaminophen **OR** acetaminophen, hydrALAZINE, ondansetron **OR** ondansetron (ZOFRAN) IV  Assessment/ Plan:  [name] is a 76 y.o.  female  with  hypertension, anemia, multiple myeloma, chronic respiratory failure on home O2, and end stage renal disease on dialysis  was admitted for Melena [K92.1] Rectal bleeding [K62.5] Anticoagulated [Z79.01] Gastrointestinal hemorrhage, unspecified gastrointestinal hemorrhage type [K92.2]   [facility]/MWF/Rt lower forearm AVF  Melena/anemia with chronic kidney disease. Believed due to constipation and hemorrhoids.  Hgb continues to fluctuate, 8.1 today.  Followed by GI team, defer colonoscopy at them time and treat constipation.   2.  End-stage renal disease on hemodialysis.   Scheduled for dialysis today, will challenge fluid removal to improve respiratory status. Next treatment scheduled for Wednesday.   3. Secondary Hyperparathyroidism:  Lab Results  Component Value Date   PTH 242 (H)  09/29/2020   CALCIUM 9.3 02/13/2022   PHOS 4.0 02/13/2022    Calcium and phosphorus at goal.  Continue sevelamer with meals.  4.  Hypertension with chronic kidney disease.  Home regimen includes amlodipine, midodrine and hydrochlorothiazide.  Currently prescribed these medications.    LOS: 3 Shantelle Breeze 7/10/202312:01 PM   

## 2022-02-14 NOTE — Progress Notes (Addendum)
Date and time results received: 02/14/22  00:06   Test:  Troponin Critical Value: 19  Name of Provider Notified: Dr Opal Sidles  Orders Received? Or Actions Taken?:  Pending

## 2022-02-15 DIAGNOSIS — R109 Unspecified abdominal pain: Secondary | ICD-10-CM | POA: Diagnosis not present

## 2022-02-15 DIAGNOSIS — D649 Anemia, unspecified: Secondary | ICD-10-CM

## 2022-02-15 DIAGNOSIS — K921 Melena: Secondary | ICD-10-CM | POA: Diagnosis not present

## 2022-02-15 DIAGNOSIS — N186 End stage renal disease: Secondary | ICD-10-CM | POA: Diagnosis not present

## 2022-02-15 LAB — BASIC METABOLIC PANEL
Anion gap: 9 (ref 5–15)
BUN: 13 mg/dL (ref 8–23)
CO2: 30 mmol/L (ref 22–32)
Calcium: 9.2 mg/dL (ref 8.9–10.3)
Chloride: 98 mmol/L (ref 98–111)
Creatinine, Ser: 3.07 mg/dL — ABNORMAL HIGH (ref 0.44–1.00)
GFR, Estimated: 15 mL/min — ABNORMAL LOW (ref >60)
Glucose, Bld: 83 mg/dL (ref 70–99)
Potassium: 3.6 mmol/L (ref 3.5–5.1)
Sodium: 137 mmol/L (ref 135–145)

## 2022-02-15 LAB — CBC
HCT: 23.8 % — ABNORMAL LOW (ref 36.0–46.0)
Hemoglobin: 7.5 g/dL — ABNORMAL LOW (ref 12.0–15.0)
MCH: 30.6 pg (ref 26.0–34.0)
MCHC: 31.5 g/dL (ref 30.0–36.0)
MCV: 97.1 fL (ref 80.0–100.0)
Platelets: 195 10*3/uL (ref 150–400)
RBC: 2.45 MIL/uL — ABNORMAL LOW (ref 3.87–5.11)
RDW: 14.9 % (ref 11.5–15.5)
WBC: 4.4 10*3/uL (ref 4.0–10.5)
nRBC: 0 % (ref 0.0–0.2)

## 2022-02-15 LAB — MAGNESIUM: Magnesium: 2.1 mg/dL (ref 1.7–2.4)

## 2022-02-15 NOTE — Progress Notes (Signed)
[facility]  ROUNDING NOTE   Subjective:   Cassandra Joseph is a 76 year old female with past medical history including hypertension, multiple myeloma, anemia, and end-stage renal disease on hemodialysis.  Patient presents to the emergency department complaining of blood in stool.  Patient has been admitted for Melena [K92.1] Rectal bleeding [K62.5] Anticoagulated [Z79.01] Gastrointestinal hemorrhage, unspecified gastrointestinal hemorrhage type [K92.2]  Patient is known to our practice from previous admissions and receives outpatient dialysis treatments at [facility] on a MWF schedule, supervised by [facility] physicians.    Patient seen at bedside in ICU Currently sitting up, preparing for breakfast States she feels better today, more at her baseline Shortness of breath has improved since admission   Objective:  Vital signs in last 24 hours:  Temp:  [97.8 F (36.6 C)-98.7 F (37.1 C)] 97.8 F (36.6 C) (07/11 0000) Pulse Rate:  [57-97] 74 (07/11 0600) Resp:  [14-39] 14 (07/11 0600) BP: (122-164)/(61-101) 145/66 (07/11 0930) SpO2:  [93 %-100 %] 100 % (07/11 0600) FiO2 (%):  [50 %] 50 % (07/11 0000) Weight:  [115.7 kg] 115.7 kg (07/10 1448)  Weight change: 3.2 kg Filed Weights   02/14/22 0500 02/14/22 1113 02/14/22 1448  Weight: 109.4 kg 117.7 kg 115.7 kg    Intake/Output: I/O last 3 completed shifts: In: 69 [P.O.:490] Out: 2500 [Other:2500]   Intake/Output this shift:  No intake/output data recorded.  Physical Exam: General: NAD  Head: Normocephalic, atraumatic. Moist oral mucosal membranes  Eyes: Anicteric  Lungs:  Right lower crackles, normal effort  Heart: Irregular rate and rhythm  Abdomen:  Soft, nontender, obese  Extremities: trace peripheral edema.  Neurologic: Nonfocal, moving all four extremities  Skin: No lesions  Access: Left aVF    Basic Metabolic Panel: Recent Labs  Lab 02/10/22 1453 02/13/22 1851 02/15/22 0419  NA 136 138  137  K 4.0 4.9 3.6  CL 94* 97* 98  CO2 31 27 30   GLUCOSE 93 109* 83  BUN 29* 36* 13  CREATININE 4.99* 5.86* 3.07*  CALCIUM 9.0 9.3 9.2  MG  --   --  2.1  PHOS  --  4.0  --      Liver Function Tests: Recent Labs  Lab 02/10/22 1453 02/13/22 1851  AST 17  --   ALT 15  --   ALKPHOS 75  --   BILITOT 1.0  --   PROT 8.1  --   ALBUMIN 3.6 3.8    Recent Labs  Lab 02/10/22 2353  LIPASE 36    No results for input(s): "AMMONIA" in the last 168 hours.  CBC: Recent Labs  Lab 02/10/22 1453 02/12/22 1638 02/13/22 0530 02/13/22 1851 02/14/22 0154 02/15/22 0419  WBC 6.7  --   --  13.5*  --  4.4  HGB 9.1* 7.9* 7.8* 9.2* 8.1* 7.5*  HCT 29.1* 25.1* 24.8* 30.1* 25.2* 23.8*  MCV 97.3  --   --  98.4  --  97.1  PLT 232  --   --  281  --  195     Cardiac Enzymes: No results for input(s): "CKTOTAL", "CKMB", "CKMBINDEX", "TROPONINI" in the last 168 hours.  BNP: Invalid input(s): "POCBNP"  CBG: Recent Labs  Lab 02/13/22 1823  GLUCAP 107*     Microbiology: Results for orders placed or performed during the hospital encounter of 02/10/22  MRSA Next Gen by PCR, Nasal     Status: None   Collection Time: 02/11/22  8:27 PM   Specimen: Nasal Mucosa; Nasal  Swab  Result Value Ref Range Status   MRSA by PCR Next Gen NOT DETECTED NOT DETECTED Final    Comment: (NOTE) The GeneXpert MRSA Assay (FDA approved for NASAL specimens only), is one component of a comprehensive MRSA colonization surveillance program. It is not intended to diagnose MRSA infection nor to guide or monitor treatment for MRSA infections. Test performance is not FDA approved in patients less than 71 years old. Performed at [facility], [address] [place], [place] [address]     Coagulation Studies: No results for input(s): "LABPROT", "INR" in the last 72 hours.  Urinalysis: No results for input(s): "COLORURINE", "LABSPEC", "PHURINE", "GLUCOSEU", "HGBUR", "BILIRUBINUR", "KETONESUR",  "PROTEINUR", "UROBILINOGEN", "NITRITE", "LEUKOCYTESUR" in the last 72 hours.  Invalid input(s): "APPERANCEUR"    Imaging: DG Chest 1 View  Result Date: 02/13/2022 CLINICAL DATA:  Shortness of breath EXAM: CHEST  1 VIEW COMPARISON:  11/19/2021, 08/07/2020 FINDINGS: Chronic elevation of the right diaphragm. Cardiomegaly with vascular congestion and diffuse bilateral interstitial and hazy pulmonary opacity consistent with pulmonary edema. Aortic atherosclerosis. IMPRESSION: Cardiomegaly with vascular congestion and pulmonary edema. Electronically Signed   By: Donavan Foil M.D.   On: 02/13/2022 19:01     Medications:     amLODipine  5 mg Oral Daily   aspirin EC  81 mg Oral Daily   atorvastatin  80 mg Oral QHS   Chlorhexidine Gluconate Cloth  6 each Topical Daily   fluticasone  2 spray Each Nare Daily   gabapentin  300 mg Oral TID   hydroxychloroquine  200 mg Oral Daily   lactulose  30 g Oral BID   melatonin  5 mg Oral QHS   midodrine  10 mg Oral TID WC   nitroGLYCERIN  1 inch Topical Q6H   sevelamer carbonate  2,400 mg Oral TID WC   acetaminophen **OR** acetaminophen, hydrALAZINE, ondansetron **OR** ondansetron (ZOFRAN) IV  Assessment/ Plan:  Cassandra Joseph is a 76 y.o.  female  with  hypertension, anemia, multiple myeloma, chronic respiratory failure on home O2, and end stage renal disease on dialysis  was admitted for Melena [K92.1] Rectal bleeding [K62.5] Anticoagulated [Z79.01] Gastrointestinal hemorrhage, unspecified gastrointestinal hemorrhage type [K92.2]   [facility]/MWF/Rt lower forearm AVF  Melena/anemia with chronic kidney disease. Believed due to constipation and hemorrhoids.  Hemoglobin 7.5 today, 8.1 yesterday. Followed by GI team, defer colonoscopy at this time and treat constipation.   2.  End-stage renal disease on hemodialysis.   Patient received dialysis yesterday, UF goal 1.5 L achieved.  Next treatment scheduled for Wednesday.   3.  Secondary Hyperparathyroidism:  Lab Results  Component Value Date   PTH 242 (H) 09/29/2020   CALCIUM 9.2 02/15/2022   PHOS 4.0 02/13/2022    We will continue to monitor bone minerals during this admission.  Continue sevelamer with meals.  4.  Hypertension with chronic kidney disease.  Home regimen includes amlodipine, midodrine and hydrochlorothiazide.  Currently prescribed these medications.  Blood pressure 145/66   LOS: 4 Seydina Holliman 7/11/202312:15 PM

## 2022-02-15 NOTE — Progress Notes (Addendum)
PROGRESS NOTE    Cassandra Joseph  UTM:[id number] DOB: Apr 09, 1946 DOA: 02/10/2022 PCP: Tracie Harrier, MD    Brief Narrative:  76 year old female with past medical history of hypertension,  chronic anemia, paroxysmal atrial fibrillation, ESRD on hemodialysis, history of hemorrhoids was brought into the hospital for blood in her diapers at home.  Patient is a bedbound baseline is on aspirin and Eliquis.  In the ED, hemoglobin was stable but FOBT was positive but no fresh bleeding.  GI was consulted but patient did not wish to undergo colonoscopy or endoscopy evaluation.  Does have a history of constipation and was put on bowel regimen.   During hospitalization, patient had acute respiratory distress and required BiPAP briefly.  She did have borderline hypotension and midodrine has been reinitiated from home.  At this time, blood pressure has improved and patient will be transferred out of the ICU today.  Assessment & Plan:   Principal Problem:   Melena Active Problems:   Abdominal pain   Anemia   Essential hypertension   ESRD on hemodialysis (HCC)   PAF (paroxysmal atrial fibrillation) (HCC)   OSA on CPAP   Rectal bleeding   Lower GI bleeding-malena  Has history of hemorrhoids.  currently working on bowel regimen.  Patient was on aspirin and Eliquis as outpatient continue to hold Eliquis for now CT angio GI bleed scan without evidence of ongoing bleeding.  GI was consulted and patient does not wish to undergo colonoscopy.  Continue lactulose and bowel regimen on discharge.  Noted downtrending hemoglobin level but no mention of further rectal bleed.  We will continue to monitor.     Latest Ref Rng & Units 02/15/2022    4:19 AM 02/14/2022    1:54 AM 02/13/2022    6:51 PM  CBC  WBC 4.0 - 10.5 K/uL 4.4   13.5   Hemoglobin 12.0 - 15.0 g/dL 7.5  8.1  9.2   Hematocrit 36.0 - 46.0 % 23.8  25.2  30.1   Platelets 150 - 400 K/uL 195   281      Respiratory distress 02/13/2022.  Had acute on  chronic hypoxic respiratory failure and initially required 6 L of oxygen with saturation of 82% and was tachypneic.  Thought to be secondary to flash pulmonary edema.  Was put on BiPAP and IV Lasix and nitroglycerin was given.  Patient underwent dialysis 02/14/2022.  Has clinically improved at this time.  ESRD on hemodialysis: Nephrology on board.  Hemodialysis as per nephrology.  Paroxysmal atrial fibrillation.  Rate controlled.  Continue aspirin.  Eliquis currently on hold.  OSA on CPAP: On CPAP at home.  Required BiPAP briefly.  Currently off BiPAP.  Chronic pain syndrome.  Continue gabapentin.  Essential hypertension:  Continue midodrine.  Blood pressure has improved at this time.  We will discontinue amlodipine.  Debility, deconditioning.  Bedbound status.  Has 24/7 oxygen at home and lives with family.  DVT prophylaxis: SCDs Start: 02/10/22 2008   Code Status: Full code  Family Communication:  I was unable to reach the patient' daughter today on the home and cell phone. Previous provider has spoken with the patient's daughter on 02/13/2022.  Disposition Plan: Home with home health in 1 to 2 days if patient continues to clinically improve and hemoglobin  stable.  Status is: Inpatient  Remains inpatient appropriate because: Lower GI bleed, acute on chronic respiratory failure, closer monitoring of hemoglobin,  Consultants:  Gastroenterology Nephrology  Procedures:  Hemodialysis  Antimicrobials:  None  Subjective: Today, patient was seen and examined at bedside.  Feels okay.  Denies any pain, nausea, vomiting.  Has had bowel movement yesterday but denied any obvious blood.  Still in the ICU.  Patient is bedbound and is on 24/7 oxygen at home.  Lives with her granddaughter and daughter at home.    Objective: Vitals:   02/15/22 1000 02/15/22 1100 02/15/22 1200 02/15/22 1300  BP: (!) 134/54 138/71 139/63 (!) 89/46  Pulse: 77 72 79 88  Resp: (!) 22 (!) 25 (!) 22 18  Temp:       TempSrc:      SpO2: 99% 100% 94% 97%  Weight:      Height:        Intake/Output Summary (Last 24 hours) at 02/15/2022 1440 Last data filed at 02/14/2022 2200 Gross per 24 hour  Intake 390 ml  Output --  Net 390 ml     Filed Weights   02/14/22 0500 02/14/22 1113 02/14/22 1448  Weight: 109.4 kg 117.7 kg 115.7 kg    Physical examination:  Body mass index is 41.17 kg/m.  General: Obese built, not in obvious distress, on nasal cannula oxygen at 3 L/min. HENT:   No scleral pallor or icterus noted. Oral mucosa is moist.  Chest:  Diminished breath sounds bilaterally.  CVS: S1 &S2 heard. No murmur.  Regular rate and rhythm. Abdomen: Soft, nontender, nondistended.  Bowel sounds are heard.   Extremities: No cyanosis, clubbing or edema.  Peripheral pulses are palpable.  Right upper extremity AV fistula with thrill. Psych: Alert, awake and oriented, normal mood CNS:  No cranial nerve deficits.  Power equal in all extremities.   Skin: Warm and dry.  No rashes noted.   Data Reviewed: I have personally reviewed following labs and imaging studies  CBC: Recent Labs  Lab 02/10/22 1453 02/12/22 1638 02/13/22 0530 02/13/22 1851 02/14/22 0154 02/15/22 0419  WBC 6.7  --   --  13.5*  --  4.4  HGB 9.1* 7.9* 7.8* 9.2* 8.1* 7.5*  HCT 29.1* 25.1* 24.8* 30.1* 25.2* 23.8*  MCV 97.3  --   --  98.4  --  97.1  PLT 232  --   --  281  --  967    Basic Metabolic Panel: Recent Labs  Lab 02/10/22 1453 02/13/22 1851 02/15/22 0419  NA 136 138 137  K 4.0 4.9 3.6  CL 94* 97* 98  CO2 '31 27 30  '$ GLUCOSE 93 109* 83  BUN 29* 36* 13  CREATININE 4.99* 5.86* 3.07*  CALCIUM 9.0 9.3 9.2  MG  --   --  2.1  PHOS  --  4.0  --     GFR: Estimated Creatinine Clearance: 20.5 mL/min (A) (by C-G formula based on SCr of 3.07 mg/dL (H)). Liver Function Tests: Recent Labs  Lab 02/10/22 1453 02/13/22 1851  AST 17  --   ALT 15  --   ALKPHOS 75  --   BILITOT 1.0  --   PROT 8.1  --   ALBUMIN 3.6  3.8    Recent Labs  Lab 02/10/22 2353  LIPASE 36    No results for input(s): "AMMONIA" in the last 168 hours. Coagulation Profile: No results for input(s): "INR", "PROTIME" in the last 168 hours. Cardiac Enzymes: No results for input(s): "CKTOTAL", "CKMB", "CKMBINDEX", "TROPONINI" in the last 168 hours. BNP (last 3 results) No results for input(s): "PROBNP" in the last 8760 hours. HbA1C: No results for input(s): "HGBA1C" in the last  72 hours. CBG: Recent Labs  Lab 02/13/22 1823  GLUCAP 107*    Lipid Profile: No results for input(s): "CHOL", "HDL", "LDLCALC", "TRIG", "CHOLHDL", "LDLDIRECT" in the last 72 hours. Thyroid Function Tests: No results for input(s): "TSH", "T4TOTAL", "FREET4", "T3FREE", "THYROIDAB" in the last 72 hours. Anemia Panel: No results for input(s): "VITAMINB12", "FOLATE", "FERRITIN", "TIBC", "IRON", "RETICCTPCT" in the last 72 hours. Sepsis Labs: Recent Labs  Lab 02/10/22 2237 02/11/22 0135  LATICACIDVEN 1.2 0.8     Recent Results (from the past 240 hour(s))  MRSA Next Gen by PCR, Nasal     Status: None   Collection Time: 02/11/22  8:27 PM   Specimen: Nasal Mucosa; Nasal Swab  Result Value Ref Range Status   MRSA by PCR Next Gen NOT DETECTED NOT DETECTED Final    Comment: (NOTE) The GeneXpert MRSA Assay (FDA approved for NASAL specimens only), is one component of a comprehensive MRSA colonization surveillance program. It is not intended to diagnose MRSA infection nor to guide or monitor treatment for MRSA infections. Test performance is not FDA approved in patients less than 53 years old. Performed at [facility], [address] [place], [place] [address]       Radiology Studies: DG Chest 1 View  Result Date: 02/13/2022 CLINICAL DATA:  Shortness of breath EXAM: CHEST  1 VIEW COMPARISON:  11/19/2021, 08/07/2020 FINDINGS: Chronic elevation of the right diaphragm. Cardiomegaly with vascular congestion and diffuse bilateral  interstitial and hazy pulmonary opacity consistent with pulmonary edema. Aortic atherosclerosis. IMPRESSION: Cardiomegaly with vascular congestion and pulmonary edema. Electronically Signed   By: Donavan Foil M.D.   On: 02/13/2022 19:01     Scheduled Meds:  amLODipine  5 mg Oral Daily   aspirin EC  81 mg Oral Daily   atorvastatin  80 mg Oral QHS   Chlorhexidine Gluconate Cloth  6 each Topical Daily   fluticasone  2 spray Each Nare Daily   gabapentin  300 mg Oral TID   hydroxychloroquine  200 mg Oral Daily   lactulose  30 g Oral BID   melatonin  5 mg Oral QHS   midodrine  10 mg Oral TID WC   nitroGLYCERIN  1 inch Topical Q6H   sevelamer carbonate  2,400 mg Oral TID WC   Continuous Infusions:   LOS: 4 days    Flora Lipps, MD [facility] Pager [phone]

## 2022-02-15 NOTE — TOC Initial Note (Signed)
Transition of Care [facility]) - Initial/Assessment Note    Patient Details  Name: Cassandra Joseph MRN: [id number] Date of Birth: 1945-09-02  Transition of Care [facility]) CM/SW Contact:    Beverly Sessions, RN Phone Number: 02/15/2022, 4:16 PM  Clinical Narrative:                  Admitted for: respiratory failure Admitted from:home alone. Patient states her daughter and granddaughter check on her daily.  She has an aide 3 days a week that helps her get ready for HD PCP: Hande  [facility] Current [facility] health/prior home health/DME: home O2 through [employer], WC, lift, BSC, shower seat   Open with [place] will need resumption orders at discharge Per patient she will need EMS at discharge   Expected Discharge Plan: [place] Barriers to Discharge: Continued Medical Work up   Patient Goals and CMS Choice        Expected Discharge Plan and Services Expected Discharge Plan: [place]                                              Prior Living Arrangements/Services                  Current home services: DME, Home PT    Activities of Daily Living Home Assistive Devices/Equipment: [employer] fast streamer, Environmental consultant (specify type), Wheelchair, Raised toilet seat with rails ADL Screening (condition at time of admission) Patient's cognitive ability adequate to safely complete daily activities?: Yes Is the patient deaf or have difficulty hearing?: No Does the patient have difficulty seeing, even when wearing glasses/contacts?: No Does the patient have difficulty concentrating, remembering, or making decisions?: No Patient able to express need for assistance with ADLs?: Yes Does the patient have difficulty dressing or bathing?: No Independently performs ADLs?: No Communication: Independent Dressing (OT): Needs assistance Is this a change from baseline?: Pre-admission baseline Grooming: Needs assistance Is this a change from baseline?:  Pre-admission baseline Feeding: Independent Bathing: Needs assistance Is this a change from baseline?: Pre-admission baseline Toileting: Needs assistance Is this a change from baseline?: Pre-admission baseline In/Out Bed: Needs assistance Is this a change from baseline?: Pre-admission baseline Walks in Home: Dependent Is this a change from baseline?: Pre-admission baseline Does the patient have difficulty walking or climbing stairs?: Yes Weakness of Legs: Both Weakness of Arms/Hands: Both  Permission Sought/Granted                  Emotional Assessment              Admission diagnosis:  Melena [K92.1] Rectal bleeding [K62.5] Anticoagulated [Z79.01] Gastrointestinal hemorrhage, unspecified gastrointestinal hemorrhage type [K92.2] Patient Active Problem List   Diagnosis Date Noted   Rectal bleeding 02/11/2022   Anemia 02/10/2022   Melena 02/10/2022   Abdominal pain 02/10/2022   Hypotension 11/19/2021   ESRD on hemodialysis ([place]) 04/28/2021   PAF (paroxysmal atrial fibrillation) ([place]) 04/28/2021   Chronic respiratory failure with hypoxia ([place]) 04/28/2021   OSA on CPAP 04/28/2021   Multiple myeloma in relapse ([place]) 04/28/2021   Rheumatoid arthritis with positive rheumatoid factor ([place]) 09/27/2020   Acute on chronic diastolic CHF (congestive heart failure) ([place]) 01/18/2017   HLD (hyperlipidemia) 01/18/2017   Essential hypertension 11/09/2016   Multiple myeloma ([place]) 18/29/9371   Metabolic syndrome 69/67/8938   PCP:  Tracie Harrier, MD [facility]:   [id number] [place] [place] - [id number]: [phone]: [phone] [id number] [address]: [phone]: [phone]   Social Determinants of Health (SDOH) Interventions    Readmission Risk Interventions    02/15/2022    4:15 PM 05/21/2021   12:18 PM 08/11/2020     2:01 PM  Readmission Risk Prevention Plan  Transportation Screening Complete Complete Complete  PCP or Specialist Appt within 3-5 Days   Complete  HRI or HoZavalaomplete  Complete  Social Work Consult for ReOlneylanning/Counseling Complete  Complete  Palliative Care Screening Not Applicable  Not Applicable  Medication Review (RPress photographerComplete Complete Complete  PCP or Specialist appointment within 3-5 days of discharge  Complete   HRI or HoGlendaleComplete   SW Recovery Care/Counseling Consult  Complete   Palliative Care Screening  Not ApWacoNot Applicable

## 2022-02-16 DIAGNOSIS — K921 Melena: Secondary | ICD-10-CM | POA: Diagnosis not present

## 2022-02-16 LAB — RENAL FUNCTION PANEL
Albumin: 3 g/dL — ABNORMAL LOW (ref 3.5–5.0)
Anion gap: 7 (ref 5–15)
BUN: 26 mg/dL — ABNORMAL HIGH (ref 8–23)
CO2: 30 mmol/L (ref 22–32)
Calcium: 9.3 mg/dL (ref 8.9–10.3)
Chloride: 97 mmol/L — ABNORMAL LOW (ref 98–111)
Creatinine, Ser: 4.58 mg/dL — ABNORMAL HIGH (ref 0.44–1.00)
GFR, Estimated: 9 mL/min — ABNORMAL LOW (ref >60)
Glucose, Bld: 77 mg/dL (ref 70–99)
Phosphorus: 3.6 mg/dL (ref 2.5–4.6)
Potassium: 3.9 mmol/L (ref 3.5–5.1)
Sodium: 134 mmol/L — ABNORMAL LOW (ref 135–145)

## 2022-02-16 LAB — CBC
HCT: 24.7 % — ABNORMAL LOW (ref 36.0–46.0)
Hemoglobin: 7.7 g/dL — ABNORMAL LOW (ref 12.0–15.0)
MCH: 30.1 pg (ref 26.0–34.0)
MCHC: 31.2 g/dL (ref 30.0–36.0)
MCV: 96.5 fL (ref 80.0–100.0)
Platelets: 197 10*3/uL (ref 150–400)
RBC: 2.56 MIL/uL — ABNORMAL LOW (ref 3.87–5.11)
RDW: 14.8 % (ref 11.5–15.5)
WBC: 5.5 10*3/uL (ref 4.0–10.5)
nRBC: 0 % (ref 0.0–0.2)

## 2022-02-16 LAB — PROTIME-INR
INR: 1.1 (ref 0.8–1.2)
Prothrombin Time: 14.3 seconds (ref 11.4–15.2)

## 2022-02-16 LAB — APTT: aPTT: 45 seconds — ABNORMAL HIGH (ref 24–36)

## 2022-02-16 MED ORDER — LIDOCAINE HCL (PF) 1 % IJ SOLN: 5.0000 mL | INTRAMUSCULAR | Status: DC | PRN

## 2022-02-16 MED ORDER — CHLORHEXIDINE GLUCONATE CLOTH 2 % EX PADS
6.0000 | MEDICATED_PAD | Freq: Every day | CUTANEOUS | Status: DC
  Administered 2022-02-17 – 2022-02-21 (×5): 6 via TOPICAL

## 2022-02-16 MED ORDER — ALTEPLASE 2 MG IJ SOLR: 2.0000 mg | Freq: Once | INTRAMUSCULAR | Status: DC | PRN

## 2022-02-16 MED ORDER — DARBEPOETIN ALFA 100 MCG/0.5ML IJ SOSY
100.0000 ug | PREFILLED_SYRINGE | INTRAMUSCULAR | Status: DC
  Administered 2022-02-16: 100 ug via SUBCUTANEOUS
  Filled 2022-02-16: qty 0.5

## 2022-02-16 MED ORDER — ANTICOAGULANT SODIUM CITRATE 4% (200MG/5ML) IV SOLN
5.0000 mL | Status: DC | PRN
Start: 2022-02-16 — End: 2022-02-16

## 2022-02-16 MED ORDER — LIDOCAINE-PRILOCAINE 2.5-2.5 % EX CREA: 1.0000 | TOPICAL_CREAM | CUTANEOUS | Status: DC | PRN

## 2022-02-16 MED ORDER — HEPARIN SODIUM (PORCINE) 1000 UNIT/ML DIALYSIS: 1000.0000 [IU] | INTRAMUSCULAR | Status: DC | PRN

## 2022-02-16 MED ORDER — PENTAFLUOROPROP-TETRAFLUOROETH EX AERO: 1.0000 | INHALATION_SPRAY | CUTANEOUS | Status: DC | PRN

## 2022-02-16 NOTE — Progress Notes (Addendum)
0808 Informed consent signed by pt and placed in chart   0813 Report given to dialysis nurse Rommel RN  8875 Pt returned to room from dialysis  1541 Pt bathed, full linen changed. Belongingness given to pt. (Cellphone)

## 2022-02-16 NOTE — Care Management Important Message (Signed)
Important Message  Patient Details  Name: Cassandra Joseph MRN: [id number] Date of Birth: 07/21/1946   Medicare Important Message Given:  Yes     [name] A Emi Lymon 02/16/2022, 11:23 AM

## 2022-02-16 NOTE — Progress Notes (Signed)
PROGRESS NOTE    MARGURIETE WOOTAN  IZT:[id number] DOB: [birth year] DOA: 02/10/2022 PCP: Tracie Harrier, MD    Brief Narrative:  76 year old female with past medical history of hypertension,  chronic anemia, paroxysmal atrial fibrillation, ESRD on hemodialysis, history of hemorrhoids was brought into the hospital for blood in her diapers at home.  Patient is a bedbound baseline is on aspirin and Eliquis.  In the ED, hemoglobin was stable but FOBT was positive but no fresh bleeding.  GI was consulted but patient did not wish to undergo colonoscopy or endoscopy evaluation.  Does have a history of constipation and was put on bowel regimen.    During hospitalization, patient had acute respiratory distress and required BiPAP briefly.  She did have borderline hypotension and midodrine has been reinitiated from home.   7/12: Hemodynamically stable.  Eliquis on hold.  CT angio for GI bleed is negative.  Patient does endorse some small-volume vaginal bleeding.  States that this is not a new issue for her.   Assessment & Plan:   Principal Problem:   Melena Active Problems:   Abdominal pain   Anemia   Essential hypertension   ESRD on hemodialysis (HCC)   PAF (paroxysmal atrial fibrillation) (HCC)   OSA on CPAP   Rectal bleeding  Lower GI bleed Patient with history of hemorrhoids.  Hemoglobin has been stable.  No signs of acute bleed.  Was on aspirin and Eliquis as outpatient.  These are currently on hold.  CT angio negative for bleed.  Patient refused colonoscopy. Plan: Check a.m. hemoglobin  Acute on chronic hypoxic respiratory failure Not resolved.  Initially required 6 L of oxygen and was briefly on BiPAP.  Suspect flash pulmonary edema.  Now respiratory status back to baseline.  ESRD on hemodialysis: Nephrology on board.  Hemodialysis as per nephrology.   Paroxysmal atrial fibrillation.  Rate controlled.  Continue aspirin.  Eliquis currently on hold.   OSA on CPAP: On CPAP at home.   Required BiPAP briefly.  Currently off BiPAP.   Chronic pain syndrome.  Continue gabapentin.   Essential hypertension:  Continue midodrine.  Blood pressure has improved at this time.  We will discontinue amlodipine.   Debility, deconditioning.  Bedbound status.  Has 24/7 oxygen at home and lives with family.  Vaginal bleeding Per patient this has been occurring on and off for several weeks.  She is reluctant to undergo any sort of invasive procedure.  We will check a.m. hemoglobin and reassess.   DVT prophylaxis: SCD Code Status: Full Family Communication: Grandson at bedside 7/12 Disposition Plan: Status is: Inpatient Remains inpatient appropriate because: Resolving respiratory failure.  Concern for vaginal versus GI blood loss.  If hemoglobin stable anticipate discharge home with home health 7/13.   Level of care: Med-Surg  Consultants:  Nephrology  Procedures:  None  Antimicrobials: None   Subjective: Seen and examined.  Resting comfortably in bed.  No visible distress.  No pain complaints.  Objective: Vitals:   02/16/22 1223 02/16/22 1229 02/16/22 1307 02/16/22 1456  BP: 126/68  (!) 141/60 (!) 143/49  Pulse:   76 80  Resp:   17 16  Temp: 97.8 F (36.6 C)  98 F (36.7 C) 98 F (36.7 C)  TempSrc:   Oral Oral  SpO2:   95% 98%  Weight:  110.8 kg    Height:        Intake/Output Summary (Last 24 hours) at 02/16/2022 1502 Last data filed at 02/16/2022 1223 Gross per 24  hour  Intake --  Output 1 ml  Net -1 ml   Filed Weights   02/16/22 0815 02/16/22 0854 02/16/22 1229  Weight: 111.6 kg 111.6 kg 110.8 kg    Examination:  General exam: Appears calm and comfortable  Respiratory system: Mild bibasilar crackles.  Normal work of breathing.  3 L Cardiovascular system: S1 & S2 heard, RRR. No JVD, murmurs, rubs, gallops or clicks. No pedal edema. Gastrointestinal system: Abdomen is nondistended, soft and nontender. No organomegaly or masses felt. Normal bowel  sounds heard. Central nervous system: Alert and oriented. No focal neurological deficits. Extremities: Symmetric 5 x 5 power. Skin: No rashes, lesions or ulcers Psychiatry: Judgement and insight appear normal. Mood & affect appropriate.     Data Reviewed: I have personally reviewed following labs and imaging studies  CBC: Recent Labs  Lab 02/10/22 1453 02/12/22 1638 02/13/22 0530 02/13/22 1851 02/14/22 0154 02/15/22 0419 02/16/22 0227  WBC 6.7  --   --  13.5*  --  4.4 5.5  HGB 9.1*   < > 7.8* 9.2* 8.1* 7.5* 7.7*  HCT 29.1*   < > 24.8* 30.1* 25.2* 23.8* 24.7*  MCV 97.3  --   --  98.4  --  97.1 96.5  PLT 232  --   --  281  --  195 197   < > = values in this interval not displayed.   Basic Metabolic Panel: Recent Labs  Lab 02/10/22 1453 02/13/22 1851 02/15/22 0419 02/16/22 0227  NA 136 138 137 134*  K 4.0 4.9 3.6 3.9  CL 94* 97* 98 97*  CO2 '31 27 30 30  '$ GLUCOSE 93 109* 83 77  BUN 29* 36* 13 26*  CREATININE 4.99* 5.86* 3.07* 4.58*  CALCIUM 9.0 9.3 9.2 9.3  MG  --   --  2.1  --   PHOS  --  4.0  --  3.6   GFR: Estimated Creatinine Clearance: 13.4 mL/min (A) (by C-G formula based on SCr of 4.58 mg/dL (H)). Liver Function Tests: Recent Labs  Lab 02/10/22 1453 02/13/22 1851 02/16/22 0227  AST 17  --   --   ALT 15  --   --   ALKPHOS 75  --   --   BILITOT 1.0  --   --   PROT 8.1  --   --   ALBUMIN 3.6 3.8 3.0*   Recent Labs  Lab 02/10/22 2353  LIPASE 36   No results for input(s): "AMMONIA" in the last 168 hours. Coagulation Profile: Recent Labs  Lab 02/16/22 0227  INR 1.1   Cardiac Enzymes: No results for input(s): "CKTOTAL", "CKMB", "CKMBINDEX", "TROPONINI" in the last 168 hours. BNP (last 3 results) No results for input(s): "PROBNP" in the last 8760 hours. HbA1C: No results for input(s): "HGBA1C" in the last 72 hours. CBG: Recent Labs  Lab 02/13/22 1823  GLUCAP 107*   Lipid Profile: No results for input(s): "CHOL", "HDL", "LDLCALC", "TRIG",  "CHOLHDL", "LDLDIRECT" in the last 72 hours. Thyroid Function Tests: No results for input(s): "TSH", "T4TOTAL", "FREET4", "T3FREE", "THYROIDAB" in the last 72 hours. Anemia Panel: No results for input(s): "VITAMINB12", "FOLATE", "FERRITIN", "TIBC", "IRON", "RETICCTPCT" in the last 72 hours. Sepsis Labs: Recent Labs  Lab 02/10/22 2237 02/11/22 0135  LATICACIDVEN 1.2 0.8    Recent Results (from the past 240 hour(s))  MRSA Next Gen by PCR, Nasal     Status: None   Collection Time: 02/11/22  8:27 PM   Specimen: Nasal Mucosa; Nasal Swab  Result Value Ref Range Status   MRSA by PCR Next Gen NOT DETECTED NOT DETECTED Final    Comment: (NOTE) The GeneXpert MRSA Assay (FDA approved for NASAL specimens only), is one component of a comprehensive MRSA colonization surveillance program. It is not intended to diagnose MRSA infection nor to guide or monitor treatment for MRSA infections. Test performance is not FDA approved in patients less than 3 years old. Performed at [facility], [address], [place], [place] [address]          Radiology Studies: No results found.      Scheduled Meds:  aspirin EC  81 mg Oral Daily   atorvastatin  80 mg Oral QHS   Chlorhexidine Gluconate Cloth  6 each Topical Daily   Chlorhexidine Gluconate Cloth  6 each Topical Q0600   fluticasone  2 spray Each Nare Daily   gabapentin  300 mg Oral TID   hydroxychloroquine  200 mg Oral Daily   lactulose  30 g Oral BID   melatonin  5 mg Oral QHS   midodrine  10 mg Oral TID WC   nitroGLYCERIN  1 inch Topical Q6H   sevelamer carbonate  2,400 mg Oral TID WC   Continuous Infusions:   LOS: 5 days      Sidney Ace, MD [facility]   If 7PM-7AM, please contact night-coverage  02/16/2022, 3:02 PM

## 2022-02-16 NOTE — Progress Notes (Signed)
[facility]  ROUNDING NOTE   Subjective:   Cassandra Joseph is a 76 year old female with past medical history including hypertension, multiple myeloma, anemia, and end-stage renal disease on hemodialysis.  Patient presents to the emergency department complaining of blood in stool.  Patient has been admitted for Melena [K92.1] Rectal bleeding [K62.5] Anticoagulated [Z79.01] Gastrointestinal hemorrhage, unspecified gastrointestinal hemorrhage type [K92.2]  Patient is known to our practice from previous admissions and receives outpatient dialysis treatments at [facility] on a MWF schedule, supervised by [facility] physicians.    Patient seen and evaluated during dialysis   HEMODIALYSIS FLOWSHEET:  Blood Flow Rate (mL/min): 400 mL/min Arterial Pressure (mmHg): 140 mmHg Venous Pressure (mmHg): 210 mmHg TMP (mmHg): 6 mmHg Ultrafiltration Rate (mL/min): 433 mL/min Dialysate Flow Rate (mL/min): 300 ml/min DO NOT KMM:NOTRRNHAFBXU: 14 DO NOT XYB:FXOVANVBTYOM: 14 Dialysis Fluid Bolus: Normal Saline Bolus Amount (mL): 300 mL  States she feels well. Breathing has returned to baseline   Objective:  Vital signs in last 24 hours:  Temp:  [97.1 F (36.2 C)-98.4 F (36.9 C)] 98 F (36.7 C) (07/12 1307) Pulse Rate:  [66-82] 76 (07/12 1307) Resp:  [15-24] 17 (07/12 1307) BP: (103-159)/(55-94) 141/60 (07/12 1307) SpO2:  [93 %-100 %] 95 % (07/12 1307) Weight:  [110.8 kg-111.6 kg] 110.8 kg (07/12 1229)  Weight change:  Filed Weights   02/16/22 0815 02/16/22 0854 02/16/22 1229  Weight: 111.6 kg 111.6 kg 110.8 kg    Intake/Output: I/O last 3 completed shifts: In: 150 [P.O.:150] Out: -    Intake/Output this shift:  Total I/O In: -  Out: 1 [Other:1]  Physical Exam: General: NAD  Head: Normocephalic, atraumatic. Moist oral mucosal membranes  Eyes: Anicteric  Lungs:  Diminished bases, normal effort  Heart: Irregular rate and rhythm  Abdomen:  Soft, nontender, obese   Extremities: trace peripheral edema.  Neurologic: Nonfocal, moving all four extremities  Skin: No lesions  Access: Left aVF    Basic Metabolic Panel: Recent Labs  Lab 02/10/22 1453 02/13/22 1851 02/15/22 0419 02/16/22 0227  NA 136 138 137 134*  K 4.0 4.9 3.6 3.9  CL 94* 97* 98 97*  CO2 31 27 30 30   GLUCOSE 93 109* 83 77  BUN 29* 36* 13 26*  CREATININE 4.99* 5.86* 3.07* 4.58*  CALCIUM 9.0 9.3 9.2 9.3  MG  --   --  2.1  --   PHOS  --  4.0  --  3.6     Liver Function Tests: Recent Labs  Lab 02/10/22 1453 02/13/22 1851 02/16/22 0227  AST 17  --   --   ALT 15  --   --   ALKPHOS 75  --   --   BILITOT 1.0  --   --   PROT 8.1  --   --   ALBUMIN 3.6 3.8 3.0*    Recent Labs  Lab 02/10/22 2353  LIPASE 36    No results for input(s): "AMMONIA" in the last 168 hours.  CBC: Recent Labs  Lab 02/10/22 1453 02/12/22 1638 02/13/22 0530 02/13/22 1851 02/14/22 0154 02/15/22 0419 02/16/22 0227  WBC 6.7  --   --  13.5*  --  4.4 5.5  HGB 9.1*   < > 7.8* 9.2* 8.1* 7.5* 7.7*  HCT 29.1*   < > 24.8* 30.1* 25.2* 23.8* 24.7*  MCV 97.3  --   --  98.4  --  97.1 96.5  PLT 232  --   --  281  --  195 197   < > = values in this interval not displayed.     Cardiac Enzymes: No results for input(s): "CKTOTAL", "CKMB", "CKMBINDEX", "TROPONINI" in the last 168 hours.  BNP: Invalid input(s): "POCBNP"  CBG: Recent Labs  Lab 02/13/22 1823  GLUCAP 107*     Microbiology: Results for orders placed or performed during the hospital encounter of 02/10/22  MRSA Next Gen by PCR, Nasal     Status: None   Collection Time: 02/11/22  8:27 PM   Specimen: Nasal Mucosa; Nasal Swab  Result Value Ref Range Status   MRSA by PCR Next Gen NOT DETECTED NOT DETECTED Final    Comment: (NOTE) The GeneXpert MRSA Assay (FDA approved for NASAL specimens only), is one component of a comprehensive MRSA colonization surveillance program. It is not intended to diagnose MRSA infection nor to  guide or monitor treatment for MRSA infections. Test performance is not FDA approved in patients less than 41 years old. Performed at [facility], [address] [place], [place] [address]     Coagulation Studies: Recent Labs    02/16/22 0227  LABPROT 14.3  INR 1.1    Urinalysis: No results for input(s): "COLORURINE", "LABSPEC", "PHURINE", "GLUCOSEU", "HGBUR", "BILIRUBINUR", "KETONESUR", "PROTEINUR", "UROBILINOGEN", "NITRITE", "LEUKOCYTESUR" in the last 72 hours.  Invalid input(s): "APPERANCEUR"    Imaging: No results found.   Medications:     aspirin EC  81 mg Oral Daily   atorvastatin  80 mg Oral QHS   Chlorhexidine Gluconate Cloth  6 each Topical Daily   Chlorhexidine Gluconate Cloth  6 each Topical Q0600   fluticasone  2 spray Each Nare Daily   gabapentin  300 mg Oral TID   hydroxychloroquine  200 mg Oral Daily   lactulose  30 g Oral BID   melatonin  5 mg Oral QHS   midodrine  10 mg Oral TID WC   nitroGLYCERIN  1 inch Topical Q6H   sevelamer carbonate  2,400 mg Oral TID WC   acetaminophen **OR** acetaminophen, hydrALAZINE, ondansetron **OR** ondansetron (ZOFRAN) IV  Assessment/ Plan:  [name] is a 76 y.o.  female  with  hypertension, anemia, multiple myeloma, chronic respiratory failure on home O2, and end stage renal disease on dialysis  was admitted for Melena [K92.1] Rectal bleeding [K62.5] Anticoagulated [Z79.01] Gastrointestinal hemorrhage, unspecified gastrointestinal hemorrhage type [K92.2]   [facility]/MWF/Rt lower forearm AVF  Melena/anemia with chronic kidney disease. Believed due to constipation and hemorrhoids. Followed by GI team, defer colonoscopy at this time and treat constipation. Hemoglobin 7.7. No signs of bleeding since admission   2.  End-stage renal disease on hemodialysis.   Receiving dialysis today, UF goal 1L as tolerated. Next treatment scheduled for Friday.   3. Secondary  Hyperparathyroidism:  Lab Results  Component Value Date   PTH 242 (H) 09/29/2020   CALCIUM 9.3 02/16/2022   PHOS 3.6 02/16/2022    Calcium and phosphorus at acceptable range. Continue sevelamer with meals.  4.  Hypertension with chronic kidney disease.  Home regimen includes amlodipine, midodrine and hydrochlorothiazide.  Currently prescribed these medications.  Blood pressure 147/55 during dialysis   LOS: 5 Jayshun Galentine 7/12/20231:17 PM

## 2022-02-16 NOTE — Progress Notes (Signed)
       CROSS COVER NOTE  NAME: KYMBERLI WIEGAND MRN: [id number] DOB : Jan 16, 1946    Date of Service   02/16/22  HPI/Events of Note   Secure chat received from nursing reporting vaginal bleeding. Per report the blood is bright red and a small amount.  On bedside interview M(r)s [name] reports this is an ongoing issue for the last month and typically occurs after she has been dialyzed. She denies dizziness, does endorse ongoing fatigue. BP and HR are stable.   Interventions   Plan: Monitor AM CBC Add Coags to AM labs      This document was prepared using Dragon voice recognition software and may include unintentional dictation errors.  Neomia Glass DNP, MHA, FNP-BC Nurse Practitioner [facility] Pager [phone]

## 2022-02-16 NOTE — Progress Notes (Signed)
Pt notified this RN of blood coming out of her vagina, checked and wiped.Noted fresh red blood on her vagina. Pt denies any pain. Hospitalist made aware. Monitoring continued. Pt made comfortable in bed

## 2022-02-16 NOTE — Plan of Care (Signed)

## 2022-02-17 DIAGNOSIS — K921 Melena: Secondary | ICD-10-CM | POA: Diagnosis not present

## 2022-02-17 LAB — CBC WITH DIFFERENTIAL/PLATELET
Abs Immature Granulocytes: 0.02 10*3/uL (ref 0.00–0.07)
Basophils Absolute: 0.1 10*3/uL (ref 0.0–0.1)
Basophils Relative: 1 %
Eosinophils Absolute: 0.4 10*3/uL (ref 0.0–0.5)
Eosinophils Relative: 7 %
HCT: 24.3 % — ABNORMAL LOW (ref 36.0–46.0)
Hemoglobin: 7.8 g/dL — ABNORMAL LOW (ref 12.0–15.0)
Immature Granulocytes: 0 %
Lymphocytes Relative: 20 %
Lymphs Abs: 1.1 10*3/uL (ref 0.7–4.0)
MCH: 30.5 pg (ref 26.0–34.0)
MCHC: 32.1 g/dL (ref 30.0–36.0)
MCV: 94.9 fL (ref 80.0–100.0)
Monocytes Absolute: 0.7 10*3/uL (ref 0.1–1.0)
Monocytes Relative: 13 %
Neutro Abs: 3.3 10*3/uL (ref 1.7–7.7)
Neutrophils Relative %: 59 %
Platelets: 213 10*3/uL (ref 150–400)
RBC: 2.56 MIL/uL — ABNORMAL LOW (ref 3.87–5.11)
RDW: 14.6 % (ref 11.5–15.5)
WBC: 5.5 10*3/uL (ref 4.0–10.5)
nRBC: 0 % (ref 0.0–0.2)

## 2022-02-17 LAB — CBC
HCT: 25.8 % — ABNORMAL LOW (ref 36.0–46.0)
Hemoglobin: 8.1 g/dL — ABNORMAL LOW (ref 12.0–15.0)
MCH: 30.3 pg (ref 26.0–34.0)
MCHC: 31.4 g/dL (ref 30.0–36.0)
MCV: 96.6 fL (ref 80.0–100.0)
Platelets: 215 10*3/uL (ref 150–400)
RBC: 2.67 MIL/uL — ABNORMAL LOW (ref 3.87–5.11)
RDW: 14.9 % (ref 11.5–15.5)
WBC: 5.4 10*3/uL (ref 4.0–10.5)
nRBC: 0 % (ref 0.0–0.2)

## 2022-02-17 LAB — GLUCOSE, CAPILLARY
Glucose-Capillary: 77 mg/dL (ref 70–99)
Glucose-Capillary: 79 mg/dL (ref 70–99)

## 2022-02-17 NOTE — Progress Notes (Signed)
PROGRESS NOTE    Cassandra Joseph  TGG:[id number] DOB: August 11, 1945 DOA: 02/10/2022 PCP: Tracie Harrier, MD    Brief Narrative:  76 year old female with past medical history of hypertension,  chronic anemia, paroxysmal atrial fibrillation, ESRD on hemodialysis, history of hemorrhoids was brought into the hospital for blood in her diapers at home.  Patient is a bedbound baseline is on aspirin and Eliquis.  In the ED, hemoglobin was stable but FOBT was positive but no fresh bleeding.  GI was consulted but patient did not wish to undergo colonoscopy or endoscopy evaluation.  Does have a history of constipation and was put on bowel regimen.    During hospitalization, patient had acute respiratory distress and required BiPAP briefly.  She did have borderline hypotension and midodrine has been reinitiated from home.   7/12: Hemodynamically stable.  Eliquis on hold.  CT angio for GI bleed is negative.  Patient does endorse some small-volume vaginal bleeding.  States that this is not a new issue for her.  7/13: Breathing improved.  Approaching baseline   Assessment & Plan:   Principal Problem:   Melena Active Problems:   Abdominal pain   Anemia   Essential hypertension   ESRD on hemodialysis (HCC)   PAF (paroxysmal atrial fibrillation) (HCC)   OSA on CPAP   Rectal bleeding  Lower GI bleed Patient with history of hemorrhoids.  Hemoglobin has been stable.  No signs of acute bleed.  Was on aspirin and Eliquis as outpatient.  These are currently on hold.  CT angio negative for bleed.  Patient refused colonoscopy. Plan: Hemoglobin has been relatively stable.  Will monitor for the next 24 hours.  If hemoglobin stable on 714 anticipate discharge after hemodialysis  Acute on chronic hypoxic respiratory failure Not resolved.  Initially required 6 L of oxygen and was briefly on BiPAP.  Suspect flash pulmonary edema.  Now respiratory status back to baseline.  ESRD on hemodialysis: Nephrology on  board.  Hemodialysis as per nephrology.  Plan for hemodialysis 7/14 and likely discharge after   Paroxysmal atrial fibrillation.  Rate controlled.  Continue aspirin.  Eliquis currently on hold.  Will discuss with patient further but plan to hold Eliquis at time of discharge for 1 week, have patient see PCP in office and discuss risk/benefit of restarting anticoagulation   OSA on CPAP: On CPAP at home.  Required BiPAP briefly.  Currently off BiPAP.   Chronic pain syndrome.  Continue gabapentin.   Essential hypertension:  Continue midodrine.  Blood pressure has improved at this time.  We will discontinue amlodipine.   Debility, deconditioning.  Bedbound status.  Has 24/7 oxygen at home and lives with family.  Vaginal bleeding Per patient this has been occurring on and off for several weeks.  She is reluctant to undergo any sort of invasive procedure.  We will check a.m. hemoglobin and reassess.   DVT prophylaxis: SCD Code Status: Full Family Communication: Grandson at bedside 7/12 Disposition Plan: Status is: Inpatient Remains inpatient appropriate because: Resolving respiratory failure.  Concern for vaginal versus GI blood loss.  If hemoglobin stable anticipate discharge home with home health 7/14   Level of care: Med-Surg  Consultants:  Nephrology  Procedures:  None  Antimicrobials: None   Subjective: Seen and examined.  Resting comfortably in bed.  No visible distress.  No pain complaints.  Objective: Vitals:   02/16/22 1655 02/16/22 2030 02/17/22 0559 02/17/22 0852  BP: (!) 157/60 128/68 (!) 152/60 (!) 155/77  Pulse: 65 67 67  76  Resp: '17 15 17 15  '$ Temp: 98.2 F (36.8 C) 98.5 F (36.9 C) 98.7 F (37.1 C) 98.2 F (36.8 C)  TempSrc: Oral Oral  Oral  SpO2: 96% 100% 98% 99%  Weight:      Height:        Intake/Output Summary (Last 24 hours) at 02/17/2022 1209 Last data filed at 02/16/2022 1855 Gross per 24 hour  Intake 240 ml  Output 1 ml  Net 239 ml   Filed  Weights   02/16/22 0815 02/16/22 0854 02/16/22 1229  Weight: 111.6 kg 111.6 kg 110.8 kg    Examination:  General exam: NAD Respiratory system: Mild bibasilar crackles.  Normal work of breathing.  3 L Cardiovascular system: S1-S2, RRR, no murmurs, no pedal edema Gastrointestinal system: Soft, NT/ND, normal bowel sounds Central nervous system: Alert and oriented. No focal neurological deficits. Extremities: Symmetric 5 x 5 power. Skin: No rashes, lesions or ulcers Psychiatry: Judgement and insight appear normal. Mood & affect appropriate.     Data Reviewed: I have personally reviewed following labs and imaging studies  CBC: Recent Labs  Lab 02/13/22 1851 02/14/22 0154 02/15/22 0419 02/16/22 0227 02/17/22 0522 02/17/22 0852  WBC 13.5*  --  4.4 5.5 5.5 5.4  NEUTROABS  --   --   --   --  3.3  --   HGB 9.2* 8.1* 7.5* 7.7* 7.8* 8.1*  HCT 30.1* 25.2* 23.8* 24.7* 24.3* 25.8*  MCV 98.4  --  97.1 96.5 94.9 96.6  PLT 281  --  195 197 213 732   Basic Metabolic Panel: Recent Labs  Lab 02/10/22 1453 02/13/22 1851 02/15/22 0419 02/16/22 0227  NA 136 138 137 134*  K 4.0 4.9 3.6 3.9  CL 94* 97* 98 97*  CO2 '31 27 30 30  '$ GLUCOSE 93 109* 83 77  BUN 29* 36* 13 26*  CREATININE 4.99* 5.86* 3.07* 4.58*  CALCIUM 9.0 9.3 9.2 9.3  MG  --   --  2.1  --   PHOS  --  4.0  --  3.6   GFR: Estimated Creatinine Clearance: 13.4 mL/min (A) (by C-G formula based on SCr of 4.58 mg/dL (H)). Liver Function Tests: Recent Labs  Lab 02/10/22 1453 02/13/22 1851 02/16/22 0227  AST 17  --   --   ALT 15  --   --   ALKPHOS 75  --   --   BILITOT 1.0  --   --   PROT 8.1  --   --   ALBUMIN 3.6 3.8 3.0*   Recent Labs  Lab 02/10/22 2353  LIPASE 36   No results for input(s): "AMMONIA" in the last 168 hours. Coagulation Profile: Recent Labs  Lab 02/16/22 0227  INR 1.1   Cardiac Enzymes: No results for input(s): "CKTOTAL", "CKMB", "CKMBINDEX", "TROPONINI" in the last 168 hours. BNP (last 3  results) No results for input(s): "PROBNP" in the last 8760 hours. HbA1C: No results for input(s): "HGBA1C" in the last 72 hours. CBG: Recent Labs  Lab 02/13/22 1823 02/17/22 0737  GLUCAP 107* 77   Lipid Profile: No results for input(s): "CHOL", "HDL", "LDLCALC", "TRIG", "CHOLHDL", "LDLDIRECT" in the last 72 hours. Thyroid Function Tests: No results for input(s): "TSH", "T4TOTAL", "FREET4", "T3FREE", "THYROIDAB" in the last 72 hours. Anemia Panel: No results for input(s): "VITAMINB12", "FOLATE", "FERRITIN", "TIBC", "IRON", "RETICCTPCT" in the last 72 hours. Sepsis Labs: Recent Labs  Lab 02/10/22 2237 02/11/22 0135  LATICACIDVEN 1.2 0.8    Recent Results (  from the past 240 hour(s))  MRSA Next Gen by PCR, Nasal     Status: None   Collection Time: 02/11/22  8:27 PM   Specimen: Nasal Mucosa; Nasal Swab  Result Value Ref Range Status   MRSA by PCR Next Gen NOT DETECTED NOT DETECTED Final    Comment: (NOTE) The GeneXpert MRSA Assay (FDA approved for NASAL specimens only), is one component of a comprehensive MRSA colonization surveillance program. It is not intended to diagnose MRSA infection nor to guide or monitor treatment for MRSA infections. Test performance is not FDA approved in patients less than 44 years old. Performed at [facility], [address], [place], [place] [address]          Radiology Studies: No results found.      Scheduled Meds:  aspirin EC  81 mg Oral Daily   atorvastatin  80 mg Oral QHS   Chlorhexidine Gluconate Cloth  6 each Topical Daily   Chlorhexidine Gluconate Cloth  6 each Topical Q0600   Darbepoetin Alfa  100 mcg Subcutaneous Q7 days   fluticasone  2 spray Each Nare Daily   gabapentin  300 mg Oral TID   hydroxychloroquine  200 mg Oral Daily   lactulose  30 g Oral BID   melatonin  5 mg Oral QHS   midodrine  10 mg Oral TID WC   nitroGLYCERIN  1 inch Topical Q6H   sevelamer carbonate  2,400 mg Oral TID WC    Continuous Infusions:   LOS: 6 days      Sidney Ace, MD [facility]   If 7PM-7AM, please contact night-coverage  02/17/2022, 12:09 PM

## 2022-02-17 NOTE — Progress Notes (Signed)
[facility]  ROUNDING NOTE   Subjective:   Cassandra Joseph is a 76 year old female with past medical history including hypertension, multiple myeloma, anemia, and end-stage renal disease on hemodialysis.  Patient presents to the emergency department complaining of blood in stool.  Patient has been admitted for Melena [K92.1] Rectal bleeding [K62.5] Anticoagulated [Z79.01] Gastrointestinal hemorrhage, unspecified gastrointestinal hemorrhage type [K92.2]  Patient is known to our practice from previous admissions and receives outpatient dialysis treatments at [facility] on a MWF schedule, supervised by [facility] physicians.    Sitting up in the bed today.  Eating breakfast.  States her breathing is okay and close to baseline.    Objective:  Vital signs in last 24 hours:  Temp:  [97.8 F (36.6 C)-98.7 F (37.1 C)] 98.2 F (36.8 C) (07/13 0852) Pulse Rate:  [65-80] 76 (07/13 0852) Resp:  [15-24] 15 (07/13 0852) BP: (126-157)/(49-81) 155/77 (07/13 0852) SpO2:  [95 %-100 %] 99 % (07/13 0852) Weight:  [110.8 kg] 110.8 kg (07/12 1229)  Weight change:  Filed Weights   02/16/22 0815 02/16/22 0854 02/16/22 1229  Weight: 111.6 kg 111.6 kg 110.8 kg    Intake/Output: I/O last 3 completed shifts: In: 240 [P.O.:240] Out: 1 [Other:1]   Intake/Output this shift:  No intake/output data recorded.  Physical Exam: General: NAD  Head: Normocephalic, atraumatic. Moist oral mucosal membranes  Eyes: Anicteric  Lungs:  Normal effort, [place] O2, basilar crackles.  Heart: Irregular rate and rhythm  Abdomen:  Soft, nontender, obese  Extremities: trace peripheral edema.  Neurologic: Nonfocal, moving all four extremities  Skin: No lesions  Access: Left aVF    Basic Metabolic Panel: Recent Labs  Lab 02/10/22 1453 02/13/22 1851 02/15/22 0419 02/16/22 0227  NA 136 138 137 134*  K 4.0 4.9 3.6 3.9  CL 94* 97* 98 97*  CO2 31 27 30 30   GLUCOSE 93 109* 83 77  BUN 29* 36* 13 26*   CREATININE 4.99* 5.86* 3.07* 4.58*  CALCIUM 9.0 9.3 9.2 9.3  MG  --   --  2.1  --   PHOS  --  4.0  --  3.6     Liver Function Tests: Recent Labs  Lab 02/10/22 1453 02/13/22 1851 02/16/22 0227  AST 17  --   --   ALT 15  --   --   ALKPHOS 75  --   --   BILITOT 1.0  --   --   PROT 8.1  --   --   ALBUMIN 3.6 3.8 3.0*    Recent Labs  Lab 02/10/22 2353  LIPASE 36    No results for input(s): "AMMONIA" in the last 168 hours.  CBC: Recent Labs  Lab 02/13/22 1851 02/14/22 0154 02/15/22 0419 02/16/22 0227 02/17/22 0522 02/17/22 0852  WBC 13.5*  --  4.4 5.5 5.5 5.4  NEUTROABS  --   --   --   --  3.3  --   HGB 9.2* 8.1* 7.5* 7.7* 7.8* 8.1*  HCT 30.1* 25.2* 23.8* 24.7* 24.3* 25.8*  MCV 98.4  --  97.1 96.5 94.9 96.6  PLT 281  --  195 197 213 215     Cardiac Enzymes: No results for input(s): "CKTOTAL", "CKMB", "CKMBINDEX", "TROPONINI" in the last 168 hours.  BNP: Invalid input(s): "POCBNP"  CBG: Recent Labs  Lab 02/13/22 1823 02/17/22 0737  GLUCAP 107* 67     Microbiology: Results for orders placed or performed during the hospital encounter of 02/10/22  MRSA Next  Gen by PCR, Nasal     Status: None   Collection Time: 02/11/22  8:27 PM   Specimen: Nasal Mucosa; Nasal Swab  Result Value Ref Range Status   MRSA by PCR Next Gen NOT DETECTED NOT DETECTED Final    Comment: (NOTE) The GeneXpert MRSA Assay (FDA approved for NASAL specimens only), is one component of a comprehensive MRSA colonization surveillance program. It is not intended to diagnose MRSA infection nor to guide or monitor treatment for MRSA infections. Test performance is not FDA approved in patients less than 81 years old. Performed at [facility], [address] [place], [place] [address]     Coagulation Studies: Recent Labs    02/16/22 0227  LABPROT 14.3  INR 1.1     Urinalysis: No results for input(s): "COLORURINE", "LABSPEC", "PHURINE", "GLUCOSEU", "HGBUR",  "BILIRUBINUR", "KETONESUR", "PROTEINUR", "UROBILINOGEN", "NITRITE", "LEUKOCYTESUR" in the last 72 hours.  Invalid input(s): "APPERANCEUR"    Imaging: No results found.   Medications:     aspirin EC  81 mg Oral Daily   atorvastatin  80 mg Oral QHS   Chlorhexidine Gluconate Cloth  6 each Topical Daily   Chlorhexidine Gluconate Cloth  6 each Topical Q0600   Darbepoetin Alfa  100 mcg Subcutaneous Q7 days   fluticasone  2 spray Each Nare Daily   gabapentin  300 mg Oral TID   hydroxychloroquine  200 mg Oral Daily   lactulose  30 g Oral BID   melatonin  5 mg Oral QHS   midodrine  10 mg Oral TID WC   nitroGLYCERIN  1 inch Topical Q6H   sevelamer carbonate  2,400 mg Oral TID WC   acetaminophen **OR** acetaminophen, hydrALAZINE, ondansetron **OR** ondansetron (ZOFRAN) IV  Assessment/ Plan:  Ms. KIYARA BOUFFARD is a 76 y.o.  female  with  hypertension, anemia, multiple myeloma, chronic respiratory failure on home O2, and end stage renal disease on dialysis  was admitted for Melena [K92.1] Rectal bleeding [K62.5] Anticoagulated [Z79.01] Gastrointestinal hemorrhage, unspecified gastrointestinal hemorrhage type [K92.2]   [facility]/MWF/Rt lower forearm AVF  Melena/anemia with chronic kidney disease. Believed due to constipation and hemorrhoids. Followed by GI team, defer colonoscopy at this time and treat constipation. Hemoglobin 8.1 today. No signs of bleeding since admission   2.  End-stage renal disease on hemodialysis.   Tolerated treatment well yesterday.  Next treatment scheduled for Friday. Volume removal goal 1 to 1.5 L as tolerated.   3. Secondary Hyperparathyroidism:  Lab Results  Component Value Date   PTH 242 (H) 09/29/2020   CALCIUM 9.3 02/16/2022   PHOS 3.6 02/16/2022    Calcium and phosphorus at acceptable range. Continue sevelamer with meals.      LOS: 6 Cassandra Joseph 7/13/20239:58 AM

## 2022-02-18 DIAGNOSIS — K921 Melena: Secondary | ICD-10-CM | POA: Diagnosis not present

## 2022-02-18 LAB — CBC
HCT: 25.9 % — ABNORMAL LOW (ref 36.0–46.0)
Hemoglobin: 8.2 g/dL — ABNORMAL LOW (ref 12.0–15.0)
MCH: 29.8 pg (ref 26.0–34.0)
MCHC: 31.7 g/dL (ref 30.0–36.0)
MCV: 94.2 fL (ref 80.0–100.0)
Platelets: 220 10*3/uL (ref 150–400)
RBC: 2.75 MIL/uL — ABNORMAL LOW (ref 3.87–5.11)
RDW: 14.8 % (ref 11.5–15.5)
WBC: 5.4 10*3/uL (ref 4.0–10.5)
nRBC: 0 % (ref 0.0–0.2)

## 2022-02-18 MED ORDER — TRAZODONE HCL 50 MG PO TABS
25.0000 mg | ORAL_TABLET | Freq: Every evening | ORAL | Status: DC | PRN
  Administered 2022-02-18 – 2022-02-19 (×2): 25 mg via ORAL
  Filled 2022-02-18 (×2): qty 1

## 2022-02-18 NOTE — Progress Notes (Signed)
HD end 

## 2022-02-18 NOTE — Progress Notes (Signed)
Patient's discharge placed on hold by provider, in order to ensure HD set up in place for Monday

## 2022-02-18 NOTE — Care Management Important Message (Signed)
Important Message  Patient Details  Name: Cassandra Joseph MRN: [id number] Date of Birth: 1946/05/20   Medicare Important Message Given:  Yes  Patient out of room upon time of visit.  Copy of Medicare IM left in room for reference.   Dannette Barbara 02/18/2022, 11:40 AM

## 2022-02-18 NOTE — Discharge Summary (Signed)
Physician Discharge Summary  Cassandra Joseph VFI:[id number] DOB: Nov 23, 1945 DOA: 02/10/2022  PCP: Tracie Harrier, MD  Admit date: 02/10/2022 Discharge date: 02/21/2022  Admitted From: Home Disposition:  Home  Recommendations for Outpatient Follow-up:  Follow up with PCP in 1-2 weeks Follow-up with nephrology as directed  Home Health: Yes PT RN  Equipment/Devices: Oxygen 3 L  Discharge Condition: Stable CODE STATUS: Full Diet recommendation: Renal  Brief/Interim Summary: 76 year old female with past medical history of hypertension,  chronic anemia, paroxysmal atrial fibrillation, ESRD on hemodialysis, history of hemorrhoids was brought into the hospital for blood in her diapers at home.  Patient is a bedbound baseline is on aspirin and Eliquis.  In the ED, hemoglobin was stable but FOBT was positive but no fresh bleeding.  GI was consulted but patient did not wish to undergo colonoscopy or endoscopy evaluation.  Does have a history of constipation and was put on bowel regimen.    During hospitalization, patient had acute respiratory distress and required BiPAP briefly.  She did have borderline hypotension and midodrine has been reinitiated from home.    7/12: Hemodynamically stable.  Eliquis on hold.  CT angio for GI bleed is negative.  Patient does endorse some small-volume vaginal bleeding.  States that this is not a new issue for her.   7/13: Breathing improved.  Approaching baseline 7/14:  Successfully dialyzed today.  Did well With no complications.  No evidence of recurrent bleeding.  Stable for discharge.  At time of discharge recommend patient discontinue home Eliquis given evidence of bleeding and risk for recurrent blood loss.  Okay to resume home aspirin.  Follow-up outpatient PCP 7 to 10 days. 7/17: Discharge delayed as family was unfortunately unable to set up transport to outpatient hemodialysis for today.  I/patient was successfully dialyzed in the hospital on 7/17.  She will  be discharged home afterwards.  She will resume her normal MWF schedule.    Discharge Diagnoses:  Principal Problem:   Melena Active Problems:   Abdominal pain   Anemia   Essential hypertension   ESRD on hemodialysis (HCC)   PAF (paroxysmal atrial fibrillation) (HCC)   OSA on CPAP   Rectal bleeding  Lower GI bleed Patient with history of hemorrhoids.  Hemoglobin has been stable.  No signs of acute bleed.  Was on aspirin and Eliquis as outpatient.  These are currently on hold.  CT angio negative for bleed.  Patient refused colonoscopy. Plan: Hemoglobin stable.  Okay for discharge.  At time of discharge will recommend discontinuation of home Eliquis.  At this point risk outweighs benefit.  Recommend follow-up with PCP for further risk-benefit discussion in 7 to 10 days.   Acute on chronic hypoxic respiratory failure Not resolved.  Initially required 6 L of oxygen and was briefly on BiPAP.  Suspect flash pulmonary edema.  Now respiratory status back to baseline.  Discharged on 3 L baseline   ESRD on hemodialysis: Nephrology on board.  Hemodialysis as per nephrology.  Underwent HD successfully 7/14, 7/17   Paroxysmal atrial fibrillation.  Rate controlled.  Continue aspirin.  Eliquis currently on hold.  Recommend discontinuation of Eliquis at time of discharge and follow-up with PCPs office to discuss risk/benefit of restarting therapeutic anticoagulation.  At this point the risk outweighs the potential benefit as patient has shown a predisposition for blood loss.  Discharge Instructions  Discharge Instructions     Diet - low sodium heart healthy   Complete by: As directed    Increase activity  slowly   Complete by: As directed       Allergies as of 02/18/2022       Reactions   Shellfish Allergy Shortness Of Breath   Shellfish Allergy Other (See Comments)   Unknown reaction        Medication List     STOP taking these medications    apixaban 2.5 MG Tabs tablet Commonly  known as: ELIQUIS       TAKE these medications    amLODipine 10 MG tablet Commonly known as: NORVASC Take 5 mg by mouth daily.   Aspirin Low Dose 81 MG tablet Generic drug: aspirin EC Take 81 mg by mouth daily.   atorvastatin 80 MG tablet Commonly known as: LIPITOR Take 1 tablet (80 mg total) by mouth daily.   b complex-vitamin c-folic acid 0.8 MG Tabs tablet Take 1 tablet by mouth daily.   cinacalcet 60 MG tablet Commonly known as: SENSIPAR Take 60 mg by mouth 2 (two) times daily.   docusate sodium 100 MG capsule Commonly known as: COLACE Take 100 mg by mouth 2 (two) times daily.   epoetin alfa 4000 UNIT/ML injection Commonly known as: EPOGEN Inject 1 mL (4,000 Units total) into the vein Every Tuesday,Thursday,and Saturday with dialysis. What changed: when to take this   feeding supplement (NEPRO CARB STEADY) Liqd Take 237 mLs by mouth 3 (three) times daily between meals.   fluticasone 50 MCG/ACT nasal spray Commonly known as: FLONASE Place 1-2 sprays into both nostrils daily at 12 noon.   gabapentin 300 MG capsule Commonly known as: NEURONTIN Take 300 mg by mouth 3 (three) times daily.   Hydrocortisone (Perianal) 1 % Crea Apply topically.   hydroxychloroquine 200 MG tablet Commonly known as: PLAQUENIL Take 200 mg by mouth daily.   midodrine 10 MG tablet Commonly known as: PROAMATINE Take 1 tablet (10 mg total) by mouth 3 (three) times daily with meals.   polyethylene glycol 17 g packet Commonly known as: MIRALAX / GLYCOLAX Take 17 g by mouth 2 (two) times daily.   senna 8.6 MG tablet Commonly known as: SENOKOT Take 2 tablets by mouth at bedtime.   sevelamer carbonate 800 MG tablet Commonly known as: RENVELA Take 1,600-2,400 mg by mouth 3 (three) times daily. Take 3 tabs with meals and 2 tabs with snacks        Allergies  Allergen Reactions   Shellfish Allergy Shortness Of Breath   Shellfish Allergy Other (See Comments)    Unknown reaction     Consultations: Nephrology   Procedures/Studies: DG Chest 1 View  Result Date: 02/13/2022 CLINICAL DATA:  Shortness of breath EXAM: CHEST  1 VIEW COMPARISON:  11/19/2021, 08/07/2020 FINDINGS: Chronic elevation of the right diaphragm. Cardiomegaly with vascular congestion and diffuse bilateral interstitial and hazy pulmonary opacity consistent with pulmonary edema. Aortic atherosclerosis. IMPRESSION: Cardiomegaly with vascular congestion and pulmonary edema. Electronically Signed   By: Donavan Foil M.D.   On: 02/13/2022 19:01   CT ANGIO GI BLEED  Result Date: 02/11/2022 CLINICAL DATA:  Dark tarry stools x4 days. EXAM: CTA ABDOMEN AND PELVIS WITHOUT AND WITH CONTRAST TECHNIQUE: Multidetector CT imaging of the abdomen and pelvis was performed using the standard protocol during bolus administration of intravenous contrast. Multiplanar reconstructed images and MIPs were obtained and reviewed to evaluate the vascular anatomy. RADIATION DOSE REDUCTION: This exam was performed according to the departmental dose-optimization program which includes automated exposure control, adjustment of the mA and/or kV according to patient size and/or use of  iterative reconstruction technique. CONTRAST:  54m OMNIPAQUE IOHEXOL 350 MG/ML SOLN COMPARISON:  November 19, 2021 FINDINGS: VASCULAR Aorta: Marked severity calcification and atherosclerosis without aneurysm, dissection, vasculitis or significant stenosis. Celiac: Moderate severity calcification without evidence of aneurysm, dissection, vasculitis or significant stenosis. SMA: Moderate severity calcification without evidence of aneurysm, dissection, vasculitis or significant stenosis. Renals: There is marked severity calcification of the origin of the right renal artery with subsequent high-grade stenosis. Moderate to marked severity calcification of the origin of the left renal artery is also seen with mild to moderate severity subsequent stenosis. IMA: Marked severity  calcification without evidence of aneurysm, dissection, vasculitis or significant stenosis. Inflow: Moderate severity calcification without evidence of aneurysm, dissection, vasculitis or significant stenosis. Proximal Outflow: Bilateral common femoral and visualized portions of the superficial and profunda femoral arteries are patent without evidence of aneurysm, dissection, vasculitis or significant stenosis. Veins: No obvious venous abnormality within the limitations of this arterial phase study. Review of the MIP images confirms the above findings. NON-VASCULAR Lower chest: Mild to moderate severity bibasilar atelectasis and/or infiltrate is seen. Hepatobiliary: A small, stable coarse parenchymal calcification is seen within the right lobe of the liver. No gallstones, gallbladder wall thickening, or biliary dilatation. Pancreas: Unremarkable. No pancreatic ductal dilatation or surrounding inflammatory changes. Spleen: Normal in size without focal abnormality. Adrenals/Urinary Tract: Adrenal glands are unremarkable. There is diffuse bilateral renal cortical thinning. Innumerable bilateral partially calcified and noncalcified cysts of various sizes are seen within both kidneys. This is seen on the prior study. The largest cystic appearing area within the left kidney now contains a moderate to marked amount of heterogeneous attenuation (approximately 66.14 Hounsfield units). Moderate severity left-sided perinephric inflammatory fat stranding is also seen. This also represents a new finding. No renal calculi are clearly identified. The urinary bladder is poorly distended and subsequently limited in evaluation. Stomach/Bowel: There is a small hiatal hernia. The appendix is surgically absent. No evidence of bowel dilatation noninflamed diverticula are seen throughout the sigmoid colon. Lymphatic: No abnormal abdominal or pelvic lymph nodes are identified. Reproductive: Status post hysterectomy. No adnexal masses.  Other: A stable 4.3 cm x 2.2 cm fat containing ventral hernia is seen along the anterior aspect of the mid abdomen, to the right of midline. A stable 5.2 cm x 2.8 cm fat containing left inguinal hernia is seen. No abdominopelvic ascites. Coarse mesenteric calcifications are seen along the midline of the lower abdomen. This area is seen within the mid to lower right abdomen on the prior study. Musculoskeletal: Degenerative changes are seen throughout the lumbar spine with additional findings consistent with renal osteodystrophy. IMPRESSION: 1. Chronic renal atrophy and extensive cystic replacement of both kidneys with a moderate amount of hemorrhagic material suspected within the largest cysts on the left. Correlation with MRI is recommended for improved characterization of this region. 2. Given the degree of left-sided perinephric inflammatory fat stranding, sequelae associated with acute pyelonephritis cannot be excluded. 3. 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 4. Sigmoid diverticulosis. 5. Coarse mesenteric calcification which is stable in severity when compared to the prior exam. 6. Mild to moderate severity bibasilar atelectasis and/or infiltrate. 7. Aortic atherosclerosis. Aortic Atherosclerosis (ICD10-I70.0). Electronically Signed   By: TVirgina NorfolkM.D.   On: 02/11/2022 00:16      Subjective: Seen and examined on the day of discharge.  Stable no distress.  Stable for discharge  home.  Discharge Exam: Vitals:   02/18/22 1115 02/18/22 1121  BP: 138/77 129/72  Pulse: 74 66  Resp: 16 20  Temp:  98.1 F (36.7 C)  SpO2: 100% 98%   Vitals:   02/18/22 1045 02/18/22 1100 02/18/22 1115 02/18/22 1121  BP: (!) 141/74 (!) 147/74 138/77 129/72  Pulse: 67 68 74 66  Resp: (!) '21 15 16 20  '$ Temp:    98.1 F (36.7 C)  TempSrc:    Oral  SpO2: 100%  100% 100% 98%  Weight:      Height:        General: Pt is alert, awake, not in acute distress Cardiovascular: RRR, S1/S2 +, no rubs, no gallops Respiratory: CTA bilaterally, no wheezing, no rhonchi Abdominal: Soft, NT, ND, bowel sounds + Extremities: no edema, no cyanosis    The results of significant diagnostics from this hospitalization (including imaging, microbiology, ancillary and laboratory) are listed below for reference.     Microbiology: Recent Results (from the past 240 hour(s))  MRSA Next Gen by PCR, Nasal     Status: None   Collection Time: 02/11/22  8:27 PM   Specimen: Nasal Mucosa; Nasal Swab  Result Value Ref Range Status   MRSA by PCR Next Gen NOT DETECTED NOT DETECTED Final    Comment: (NOTE) The GeneXpert MRSA Assay (FDA approved for NASAL specimens only), is one component of a comprehensive MRSA colonization surveillance program. It is not intended to diagnose MRSA infection nor to guide or monitor treatment for MRSA infections. Test performance is not FDA approved in patients less than 17 years old. Performed at [facility], [address] [place], [place] [address]      Labs: BNP (last 3 results) Recent Labs    04/28/21 0125 05/18/21 1225 02/13/22 1851  BNP 1,496.5* 1,458.4* 6,759.1*   Basic Metabolic Panel: Recent Labs  Lab 02/13/22 1851 02/15/22 0419 02/16/22 0227  NA 138 137 134*  K 4.9 3.6 3.9  CL 97* 98 97*  CO2 '27 30 30  '$ GLUCOSE 109* 83 77  BUN 36* 13 26*  CREATININE 5.86* 3.07* 4.58*  CALCIUM 9.3 9.2 9.3  MG  --  2.1  --   PHOS 4.0  --  3.6   Liver Function Tests: Recent Labs  Lab 02/13/22 1851 02/16/22 0227  ALBUMIN 3.8 3.0*   No results for input(s): "LIPASE", "AMYLASE" in the last 168 hours. No results for input(s): "AMMONIA" in the last 168 hours. CBC: Recent Labs  Lab 02/15/22 0419 02/16/22 0227 02/17/22 0522 02/17/22 0852 02/18/22 1235  WBC 4.4 5.5 5.5 5.4 5.4  NEUTROABS  --   --  3.3  --   --    HGB 7.5* 7.7* 7.8* 8.1* 8.2*  HCT 23.8* 24.7* 24.3* 25.8* 25.9*  MCV 97.1 96.5 94.9 96.6 94.2  PLT 195 197 213 215 220   Cardiac Enzymes: No results for input(s): "CKTOTAL", "CKMB", "CKMBINDEX", "TROPONINI" in the last 168 hours. BNP: Invalid input(s): "POCBNP" CBG: Recent Labs  Lab 02/13/22 1823 02/17/22 0737 02/17/22 1132  GLUCAP 107* 77 79   D-Dimer No results for input(s): "DDIMER" in the last 72 hours. Hgb A1c No results for input(s): "HGBA1C" in the last 72 hours. Lipid Profile No results for input(s): "CHOL", "HDL", "LDLCALC", "TRIG", "CHOLHDL", "LDLDIRECT" in the last 72 hours. Thyroid function studies No results for input(s): "TSH", "T4TOTAL", "T3FREE", "THYROIDAB" in the last 72 hours.  Invalid input(s): "FREET3" Anemia work up No results for input(s): "VITAMINB12", "FOLATE", "FERRITIN", "  TIBC", "IRON", "RETICCTPCT" in the last 72 hours. Urinalysis No results found for: "COLORURINE", "APPEARANCEUR", "LABSPEC", "PHURINE", "GLUCOSEU", "HGBUR", "BILIRUBINUR", "KETONESUR", "PROTEINUR", "UROBILINOGEN", "NITRITE", "LEUKOCYTESUR" Sepsis Labs Recent Labs  Lab 02/16/22 0227 02/17/22 0522 02/17/22 0852 02/18/22 1235  WBC 5.5 5.5 5.4 5.4   Microbiology Recent Results (from the past 240 hour(s))  MRSA Next Gen by PCR, Nasal     Status: None   Collection Time: 02/11/22  8:27 PM   Specimen: Nasal Mucosa; Nasal Swab  Result Value Ref Range Status   MRSA by PCR Next Gen NOT DETECTED NOT DETECTED Final    Comment: (NOTE) The GeneXpert MRSA Assay (FDA approved for NASAL specimens only), is one component of a comprehensive MRSA colonization surveillance program. It is not intended to diagnose MRSA infection nor to guide or monitor treatment for MRSA infections. Test performance is not FDA approved in patients less than 63 years old. Performed at [facility], [address], [place],  [address]      Time coordinating discharge: Over 30  minutes  SIGNED:   Sidney Ace, MD  [facility] 02/21/2022 Pager   If 7PM-7AM, please contact night-coverage

## 2022-02-18 NOTE — TOC Progression Note (Signed)
Transition of Care [facility]) - Progression Note    Patient Details  Name: Cassandra Joseph MRN: [id number] Date of Birth: Jan 02, 1946  Transition [facility]) CM/SW Contact  Eileen Stanford, LCSW Phone Number: 02/18/2022, 12:57 PM  Clinical Narrative:   [facility] can resume HHRN on Monday 17th.     Expected Discharge Plan: [place] Barriers to Discharge: Continued Medical Work up  Expected Discharge Plan and Services Expected Discharge Plan: [name]         Expected Discharge Date: 02/18/22                                     Social Determinants of Health (SDOH) Interventions    Readmission Risk Interventions    02/15/2022    4:15 PM 05/21/2021   12:18 PM 08/11/2020    2:01 PM  Readmission Risk Prevention Plan  Transportation Screening Complete Complete Complete  PCP or Specialist Appt within 3-5 Days   Complete  HRI or [place] Complete  Complete  Social Work Consult for [place] Planning/Counseling Complete  Complete  Palliative Care Screening Not Applicable  Not Applicable  Medication Review Press photographer) Complete Complete Complete  PCP or Specialist appointment within 3-5 days of discharge  Complete   HRI or [place]  Complete   SW Recovery Care/Counseling Consult  Complete   [place]  Not Applicable

## 2022-02-18 NOTE — TOC Progression Note (Signed)
Transition of Care [facility]) - Progression Note    Patient Details  Name: Cassandra Joseph MRN: [id number] Date of Birth: Nov 27, 1945  [facility]) CM/SW [place], RN Phone Number: 02/18/2022, 3:53 PM  Clinical Narrative:     EMS called to transport the patient home, Bedside Nurse is aware   Expected Discharge Plan: [place] Barriers to Discharge: Continued Medical Work up  Expected Discharge Plan and Services Expected Discharge Plan: [place]         Expected Discharge Date: 02/18/22                                     Social Determinants of Health (SDOH) Interventions    Readmission Risk Interventions    02/15/2022    4:15 PM 05/21/2021   12:18 PM 08/11/2020    2:01 PM  Readmission Risk Prevention Plan  Transportation Screening Complete Complete Complete  PCP or Specialist Appt within 3-5 Days   Complete  HRI or [place] Complete  Complete  Social Work Consult for [place] Planning/Counseling Complete  Complete  Palliative Care Screening Not Applicable  Not Applicable  Medication Review Press photographer) Complete Complete Complete  PCP or Specialist appointment within 3-5 days of discharge  Complete   HRI or [place]  Complete   SW Recovery Care/Counseling Consult  Complete   [place]  Not Applicable

## 2022-02-18 NOTE — Progress Notes (Signed)
[facility]  ROUNDING NOTE   Subjective:   Cassandra Joseph is a 76 year old female with past medical history including hypertension, multiple myeloma, anemia, and end-stage renal disease on hemodialysis.  Patient presents to the emergency department complaining of blood in stool.  Patient has been admitted for Melena [K92.1] Rectal bleeding [K62.5] Anticoagulated [Z79.01] Gastrointestinal hemorrhage, unspecified gastrointestinal hemorrhage type [K92.2]  Patient is known to our practice from previous admissions and receives outpatient dialysis treatments at [facility] on a MWF schedule, supervised by [facility] physicians.    Seen during dialysis.  Tolerating well.  Denies any acute shortness of breath.  Ultrafiltration goal of 1500 cc today.  Scheduled to be discharged after dialysis.    Objective:  Vital signs in last 24 hours:  Temp:  [97.8 F (36.6 C)-98.2 F (36.8 C)] 98 F (36.7 C) (07/14 0711) Pulse Rate:  [62-79] 74 (07/14 1115) Resp:  [12-26] 16 (07/14 1115) BP: (128-160)/(55-80) 138/77 (07/14 1115) SpO2:  [94 %-100 %] 100 % (07/14 1115) Weight:  [110.8 kg] 110.8 kg (07/14 0749)  Weight change:  Filed Weights   02/16/22 0854 02/16/22 1229 02/18/22 0749  Weight: 111.6 kg 110.8 kg 110.8 kg    Intake/Output: I/O last 3 completed shifts: In: 120 [P.O.:120] Out: -    Intake/Output this shift:  No intake/output data recorded.  Physical Exam: General: NAD  Head: Normocephalic, atraumatic. Moist oral mucosal membranes  Eyes: Anicteric  Lungs:  Normal effort, [place] O2, very mild basilar crackles.  Heart: Irregular rate and rhythm  Abdomen:  Soft, nontender, obese  Extremities: trace peripheral edema.  Neurologic: Nonfocal, moving all four extremities  Skin: No lesions  Access: Left aVF    Basic Metabolic Panel: Recent Labs  Lab 02/13/22 1851 02/15/22 0419 02/16/22 0227  NA 138 137 134*  K 4.9 3.6 3.9  CL 97* 98 97*  CO2 _0 GLUCOSE  109* 83 77  BUN 36* 13 26*  CREATININE 5.86* 3.07* 4.58*  CALCIUM 9.3 9.2 9.3  MG  --  2.1  --   PHOS 4.0  --  3.6     Liver Function Tests: Recent Labs  Lab 02/13/22 1851 02/16/22 0227  ALBUMIN 3.8 3.0*    No results for input(s): "LIPASE", "AMYLASE" in the last 168 hours.  No results for input(s): "AMMONIA" in the last 168 hours.  CBC: Recent Labs  Lab 02/13/22 1851 02/14/22 0154 02/15/22 0419 02/16/22 0227 02/17/22 0522 02/17/22 0852  WBC 13.5*  --  4.4 5.5 5.5 5.4  NEUTROABS  --   --   --   --  3.3  --   HGB 9.2* 8.1* 7.5* 7.7* 7.8* 8.1*  HCT 30.1* 25.2* 23.8* 24.7* 24.3* 25.8*  MCV 98.4  --  97.1 96.5 94.9 96.6  PLT 281  --  195 197 213 215     Cardiac Enzymes: No results for input(s): "CKTOTAL", "CKMB", "CKMBINDEX", "TROPONINI" in the last 168 hours.  BNP: Invalid input(s): "POCBNP"  CBG: Recent Labs  Lab 02/13/22 1823 02/17/22 0737 02/17/22 1132  GLUCAP 107* 77 104     Microbiology: Results for orders placed or performed during the hospital encounter of 02/10/22  MRSA Next Gen by PCR, Nasal     Status: None   Collection Time: 02/11/22  8:27 PM   Specimen: Nasal Mucosa; Nasal Swab  Result Value Ref Range Status   MRSA by PCR Next Gen NOT DETECTED NOT DETECTED Final    Comment: (NOTE) The GeneXpert MRSA  Assay (FDA approved for NASAL specimens only), is one component of a comprehensive MRSA colonization surveillance program. It is not intended to diagnose MRSA infection nor to guide or monitor treatment for MRSA infections. Test performance is not FDA approved in patients less than 48 years old. Performed at [facility], [address] [place], [place] [address]     Coagulation Studies: Recent Labs    02/16/22 0227  LABPROT 14.3  INR 1.1     Urinalysis: No results for input(s): "COLORURINE", "LABSPEC", "PHURINE", "GLUCOSEU", "HGBUR", "BILIRUBINUR", "KETONESUR", "PROTEINUR", "UROBILINOGEN", "NITRITE", "LEUKOCYTESUR" in  the last 72 hours.  Invalid input(s): "APPERANCEUR"    Imaging: No results found.   Medications:     aspirin EC  81 mg Oral Daily   atorvastatin  80 mg Oral QHS   Chlorhexidine Gluconate Cloth  6 each Topical Daily   Chlorhexidine Gluconate Cloth  6 each Topical Q0600   Darbepoetin Alfa  100 mcg Subcutaneous Q7 days   fluticasone  2 spray Each Nare Daily   gabapentin  300 mg Oral TID   hydroxychloroquine  200 mg Oral Daily   lactulose  30 g Oral BID   melatonin  5 mg Oral QHS   midodrine  10 mg Oral TID WC   nitroGLYCERIN  1 inch Topical Q6H   sevelamer carbonate  2,400 mg Oral TID WC   acetaminophen **OR** acetaminophen, hydrALAZINE, ondansetron **OR** ondansetron (ZOFRAN) IV  Assessment/ Plan:  [name] is a 76 y.o.  female  with  hypertension, anemia, multiple myeloma, chronic respiratory failure on home O2, and end stage renal disease on dialysis  was admitted for Melena [K92.1] Rectal bleeding [K62.5] Anticoagulated [Z79.01] Gastrointestinal hemorrhage, unspecified gastrointestinal hemorrhage type [K92.2]   [facility]/MWF/Rt lower forearm AVF  Melena/anemia with chronic kidney disease. Believed due to constipation and hemorrhoids. Followed by GI team, defer colonoscopy at this time and treat constipation.   2.  End-stage renal disease on hemodialysis.  With volume overload.  3500 cc removed total so far during hospitalization.   HEMODIALYSIS FLOWSHEET:  Blood Flow Rate (mL/min): 400 mL/min Arterial Pressure (mmHg): -220 mmHg Venous Pressure (mmHg): 170 mmHg TMP (mmHg): 14 mmHg Ultrafiltration Rate (mL/min): 623 mL/min Dialysate Flow Rate (mL/min): 300 ml/min DO NOT EHU:DJSHFWYOVZCH: 14 DO NOT YIF:OYDXAJOINOMV: 14 Dialysis Fluid Bolus: Normal Saline Bolus Amount (mL): 300 mL  Volume removal goal 1 to 1.5 L as tolerated.   3. Secondary Hyperparathyroidism:  Lab Results  Component Value Date   PTH 242 (H) 09/29/2020   CALCIUM 9.3  02/16/2022   PHOS 3.6 02/16/2022    Calcium and phosphorus at acceptable range. Continue sevelamer with meals.      LOS: 7 [name] 7/14/202311:44 AM

## 2022-02-18 NOTE — Progress Notes (Signed)
HD tx end, uf goal of 1.5L met, tx 3.5 hours, report to primary RN, pt to d/c to home, no c/o, alert, vss.

## 2022-02-18 NOTE — Progress Notes (Signed)
Pre HD RN assessment 

## 2022-02-18 NOTE — Progress Notes (Signed)
Explained to patient that she should stop taking eliquis until she follows up with primary provider. Also reviewed amlodipine is for high blood pressure and midodrine is for low blood pressure. Patient verbalized understanding and states her daughter checks her blood pressure. She has been taking midodrine since her blood pressure has been low.

## 2022-02-18 NOTE — Progress Notes (Signed)
[facility] Liaison Note  Notified by TOC/Bridget of patient/family request of [facility] Paliative services.  [facility] hospital liaison will follow patient for discharge disposition.   Please call with any questions/concerns.    Thank you for the opportunity to participate in this patient's care.   Daphene Calamity, MSW [facility] Liaison  [phone]

## 2022-02-18 NOTE — Progress Notes (Signed)
HD start, pt hx of AFIB, no c/o, vss, alert, UF goal of 1-1.5L.

## 2022-02-19 DIAGNOSIS — K921 Melena: Secondary | ICD-10-CM | POA: Diagnosis not present

## 2022-02-19 LAB — CBC
HCT: 24.9 % — ABNORMAL LOW (ref 36.0–46.0)
Hemoglobin: 7.9 g/dL — ABNORMAL LOW (ref 12.0–15.0)
MCH: 30 pg (ref 26.0–34.0)
MCHC: 31.7 g/dL (ref 30.0–36.0)
MCV: 94.7 fL (ref 80.0–100.0)
Platelets: 220 10*3/uL (ref 150–400)
RBC: 2.63 MIL/uL — ABNORMAL LOW (ref 3.87–5.11)
RDW: 14.9 % (ref 11.5–15.5)
WBC: 5.8 10*3/uL (ref 4.0–10.5)
nRBC: 0 % (ref 0.0–0.2)

## 2022-02-19 LAB — BASIC METABOLIC PANEL
Anion gap: 5 (ref 5–15)
BUN: 30 mg/dL — ABNORMAL HIGH (ref 8–23)
CO2: 29 mmol/L (ref 22–32)
Calcium: 9.7 mg/dL (ref 8.9–10.3)
Chloride: 103 mmol/L (ref 98–111)
Creatinine, Ser: 4.31 mg/dL — ABNORMAL HIGH (ref 0.44–1.00)
GFR, Estimated: 10 mL/min — ABNORMAL LOW (ref >60)
Glucose, Bld: 80 mg/dL (ref 70–99)
Potassium: 4.5 mmol/L (ref 3.5–5.1)
Sodium: 137 mmol/L (ref 135–145)

## 2022-02-19 MED ORDER — PHENYLEPHRINE-MINERAL OIL-PET 0.25-14-74.9 % RE OINT
1.0000 | TOPICAL_OINTMENT | Freq: Two times a day (BID) | RECTAL | Status: DC | PRN
Start: 2022-02-19 — End: 2022-02-22
  Filled 2022-02-19: qty 57

## 2022-02-19 NOTE — Progress Notes (Signed)
PROGRESS NOTE    Cassandra Joseph  XBJ:[id number] DOB: [birth year] DOA: 02/10/2022 PCP: Tracie Harrier, MD    Brief Narrative:  76 year old female with past medical history of hypertension,  chronic anemia, paroxysmal atrial fibrillation, ESRD on hemodialysis, history of hemorrhoids was brought into the hospital for blood in her diapers at home.  Patient is a bedbound baseline is on aspirin and Eliquis.  In the ED, hemoglobin was stable but FOBT was positive but no fresh bleeding.  GI was consulted but patient did not wish to undergo colonoscopy or endoscopy evaluation.  Does have a history of constipation and was put on bowel regimen.    During hospitalization, patient had acute respiratory distress and required BiPAP briefly.  She did have borderline hypotension and midodrine has been reinitiated from home.   7/12: Hemodynamically stable.  Eliquis on hold.  CT angio for GI bleed is negative.  Patient does endorse some small-volume vaginal bleeding.  States that this is not a new issue for her.  7/13: Breathing improved.  Approaching baseline 7/15: Patient was stable for discharge on 7/14 however discharge delayed as family notified us late in the afternoon and that they could not arrange transport for outpatient dialysis on Monday.  As such discharge was delayed.  We attempted to secure transport over the weekend however transportation company is closed.  As such discharge has been delayed and current plan is to keep patient in the hospital, dialyze Monday and discharge afterwards   Assessment & Plan:   Principal Problem:   Melena Active Problems:   Abdominal pain   Anemia   Essential hypertension   ESRD on hemodialysis (HCC)   PAF (paroxysmal atrial fibrillation) (HCC)   OSA on CPAP   Rectal bleeding  Lower GI bleed Patient with history of hemorrhoids.  Hemoglobin has been stable.  No signs of acute bleed.  Was on aspirin and Eliquis as outpatient.  These are currently on hold.  CT  angio negative for bleed.  Patient refused colonoscopy. Plan: Hemoglobin stable.  No indication for transfusion.  Patient declining endoscopic evaluation at this time  Acute on chronic hypoxic respiratory failure Not resolved.  Initially required 6 L of oxygen and was briefly on BiPAP.  Suspect flash pulmonary edema.  Now respiratory status back to baseline.  ESRD on hemodialysis: Nephrology on board.  HD per nephro service.  Discharge delayed because family was not able to arrange transportation for Monday 7/17.  As such we will plan to keep in the hospital until Monday and hopefully discharge afterwards so she can resume normal MWF schedule.   Paroxysmal atrial fibrillation.  Rate controlled.  Continue aspirin.  Eliquis currently on hold.  Will discuss with patient further but plan to hold Eliquis at time of discharge for 1 week, have patient see PCP in office and discuss risk/benefit of restarting anticoagulation   OSA on CPAP: On CPAP at home.  Required BiPAP briefly.  Currently off BiPAP.   Chronic pain syndrome.  Continue gabapentin.   Essential hypertension:  Continue midodrine.  Blood pressure has improved at this time.  We will discontinue amlodipine.   Debility, deconditioning.  Bedbound status.  Has 24/7 oxygen at home and lives with family.  Vaginal bleeding Per patient this has been occurring on and off for several weeks.  She is reluctant to undergo any sort of invasive procedure.  We will check a.m. hemoglobin and reassess.   DVT prophylaxis: SCD Code Status: Full Family Communication: Grandson at bedside 7/12  Disposition Plan: Status is: Inpatient Remains inpatient appropriate because: Unsafe discharge plan.  Discharge delayed by factors outside of her control.  Family was unable to arrange transport to hemodialysis on Monday   Level of care: Med-Surg  Consultants:  Nephrology  Procedures:  None  Antimicrobials: None   Subjective: Seen and examined.  Resting  comfortably in bed.  No visible distress.  No pain complaints.  Objective: Vitals:   02/18/22 1613 02/18/22 2001 02/19/22 0336 02/19/22 0818  BP: 119/62 (!) 141/42 (!) 157/88 (!) 155/78  Pulse: 66 (!) 57 73 70  Resp: '18 18 16 18  '$ Temp: 98.3 F (36.8 C) 98.3 F (36.8 C) 98.1 F (36.7 C) (!) 97.3 F (36.3 C)  TempSrc: Oral Oral Oral   SpO2: 98% 98% 94% 95%  Weight:      Height:       No intake or output data in the 24 hours ending 02/19/22 1343  Filed Weights   02/16/22 0854 02/16/22 1229 02/18/22 0749  Weight: 111.6 kg 110.8 kg 110.8 kg    Examination:  General exam: NAD Respiratory system: Mild bibasilar crackles.  Normal work of breathing.  3 L Cardiovascular system: S1-S2, RRR, no murmurs, no pedal edema Gastrointestinal system: Soft, NT/ND, normal bowel sounds Central nervous system: Alert and oriented. No focal neurological deficits. Extremities: Symmetric 5 x 5 power. Skin: No rashes, lesions or ulcers Psychiatry: Judgement and insight appear normal. Mood & affect appropriate.     Data Reviewed: I have personally reviewed following labs and imaging studies  CBC: Recent Labs  Lab 02/16/22 0227 02/17/22 0522 02/17/22 0852 02/18/22 1235 02/19/22 0749  WBC 5.5 5.5 5.4 5.4 5.8  NEUTROABS  --  3.3  --   --   --   HGB 7.7* 7.8* 8.1* 8.2* 7.9*  HCT 24.7* 24.3* 25.8* 25.9* 24.9*  MCV 96.5 94.9 96.6 94.2 94.7  PLT 197 213 215 220 962   Basic Metabolic Panel: Recent Labs  Lab 02/13/22 1851 02/15/22 0419 02/16/22 0227 02/19/22 0749  NA 138 137 134* 137  K 4.9 3.6 3.9 4.5  CL 97* 98 97* 103  CO2 '27 30 30 29  '$ GLUCOSE 109* 83 77 80  BUN 36* 13 26* 30*  CREATININE 5.86* 3.07* 4.58* 4.31*  CALCIUM 9.3 9.2 9.3 9.7  MG  --  2.1  --   --   PHOS 4.0  --  3.6  --    GFR: Estimated Creatinine Clearance: 14.2 mL/min (A) (by C-G formula based on SCr of 4.31 mg/dL (H)). Liver Function Tests: Recent Labs  Lab 02/13/22 1851 02/16/22 0227  ALBUMIN 3.8 3.0*    No results for input(s): "LIPASE", "AMYLASE" in the last 168 hours.  No results for input(s): "AMMONIA" in the last 168 hours. Coagulation Profile: Recent Labs  Lab 02/16/22 0227  INR 1.1   Cardiac Enzymes: No results for input(s): "CKTOTAL", "CKMB", "CKMBINDEX", "TROPONINI" in the last 168 hours. BNP (last 3 results) No results for input(s): "PROBNP" in the last 8760 hours. HbA1C: No results for input(s): "HGBA1C" in the last 72 hours. CBG: Recent Labs  Lab 02/13/22 1823 02/17/22 0737 02/17/22 1132  GLUCAP 107* 77 79   Lipid Profile: No results for input(s): "CHOL", "HDL", "LDLCALC", "TRIG", "CHOLHDL", "LDLDIRECT" in the last 72 hours. Thyroid Function Tests: No results for input(s): "TSH", "T4TOTAL", "FREET4", "T3FREE", "THYROIDAB" in the last 72 hours. Anemia Panel: No results for input(s): "VITAMINB12", "FOLATE", "FERRITIN", "TIBC", "IRON", "RETICCTPCT" in the last 72 hours. Sepsis Labs:  No results for input(s): "PROCALCITON", "LATICACIDVEN" in the last 168 hours.   Recent Results (from the past 240 hour(s))  MRSA Next Gen by PCR, Nasal     Status: None   Collection Time: 02/11/22  8:27 PM   Specimen: Nasal Mucosa; Nasal Swab  Result Value Ref Range Status   MRSA by PCR Next Gen NOT DETECTED NOT DETECTED Final    Comment: (NOTE) The GeneXpert MRSA Assay (FDA approved for NASAL specimens only), is one component of a comprehensive MRSA colonization surveillance program. It is not intended to diagnose MRSA infection nor to guide or monitor treatment for MRSA infections. Test performance is not FDA approved in patients less than 69 years old. Performed at [facility], [address], [place], [place] [address]          Radiology Studies: No results found.      Scheduled Meds:  aspirin EC  81 mg Oral Daily   atorvastatin  80 mg Oral QHS   Chlorhexidine Gluconate Cloth  6 each Topical Daily   Chlorhexidine Gluconate Cloth  6 each Topical  Q0600   Darbepoetin Alfa  100 mcg Subcutaneous Q7 days   fluticasone  2 spray Each Nare Daily   gabapentin  300 mg Oral TID   hydroxychloroquine  200 mg Oral Daily   lactulose  30 g Oral BID   melatonin  5 mg Oral QHS   midodrine  10 mg Oral TID WC   nitroGLYCERIN  1 inch Topical Q6H   sevelamer carbonate  2,400 mg Oral TID WC   Continuous Infusions:   LOS: 8 days      Sidney Ace, MD [facility]   If 7PM-7AM, please contact night-coverage  02/19/2022, 1:43 PM

## 2022-02-19 NOTE — Progress Notes (Signed)
[facility]  ROUNDING NOTE   Subjective:   Cassandra Joseph is a 76 year old female with past medical history including hypertension, multiple myeloma, anemia, and end-stage renal disease on hemodialysis.  Patient presents to the emergency department complaining of blood in stool.  Patient has been admitted for Melena [K92.1] Rectal bleeding [K62.5] Anticoagulated [Z79.01] Gastrointestinal hemorrhage, unspecified gastrointestinal hemorrhage type [K92.2]  Patient is known to our practice from previous admissions and receives outpatient dialysis treatments at [facility] on a MWF schedule, supervised by [facility] physicians.    Doing fair today.  Denies any acute complaints.  No shortness of breath.  Able to eat without nausea or vomiting.  Hemodialysis completed on Friday with UF goal of 1.5 L.  Tolerated well.    Objective:  Vital signs in last 24 hours:  Temp:  [97.3 F (36.3 C)-98.3 F (36.8 C)] 97.3 F (36.3 C) (07/15 0818) Pulse Rate:  [57-74] 70 (07/15 0818) Resp:  [16-20] 18 (07/15 0818) BP: (119-157)/(42-88) 155/78 (07/15 0818) SpO2:  [94 %-100 %] 95 % (07/15 0818)  Weight change:  Filed Weights   02/16/22 0854 02/16/22 1229 02/18/22 0749  Weight: 111.6 kg 110.8 kg 110.8 kg    Intake/Output: I/O last 3 completed shifts: In: -  Out: 1.5 [Other:1.5]   Intake/Output this shift:  No intake/output data recorded.  Physical Exam: General: NAD  Head: Normocephalic, atraumatic. Moist oral mucosal membranes  Eyes: Anicteric  Lungs:  Normal effort, [place] O2,  Heart: Irregular rate and rhythm  Abdomen:  Soft, nontender, obese  Extremities: trace peripheral edema.  Neurologic: Nonfocal, moving all four extremities  Skin: No lesions  Access: Left aVF    Basic Metabolic Panel: Recent Labs  Lab 02/13/22 1851 02/15/22 0419 02/16/22 0227 02/19/22 0749  NA 138 137 134* 137  K 4.9 3.6 3.9 4.5  CL 97* 98 97* 103  CO2 _0 GLUCOSE 109* 83 77 80   BUN 36* 13 26* 30*  CREATININE 5.86* 3.07* 4.58* 4.31*  CALCIUM 9.3 9.2 9.3 9.7  MG  --  2.1  --   --   PHOS 4.0  --  3.6  --      Liver Function Tests: Recent Labs  Lab 02/13/22 1851 02/16/22 0227  ALBUMIN 3.8 3.0*    No results for input(s): "LIPASE", "AMYLASE" in the last 168 hours.  No results for input(s): "AMMONIA" in the last 168 hours.  CBC: Recent Labs  Lab 02/16/22 0227 02/17/22 0522 02/17/22 0852 02/18/22 1235 02/19/22 0749  WBC 5.5 5.5 5.4 5.4 5.8  NEUTROABS  --  3.3  --   --   --   HGB 7.7* 7.8* 8.1* 8.2* 7.9*  HCT 24.7* 24.3* 25.8* 25.9* 24.9*  MCV 96.5 94.9 96.6 94.2 94.7  PLT 197 213 215 220 220     Cardiac Enzymes: No results for input(s): "CKTOTAL", "CKMB", "CKMBINDEX", "TROPONINI" in the last 168 hours.  BNP: Invalid input(s): "POCBNP"  CBG: Recent Labs  Lab 02/13/22 1823 02/17/22 0737 02/17/22 1132  GLUCAP 107* 77 57     Microbiology: Results for orders placed or performed during the hospital encounter of 02/10/22  MRSA Next Gen by PCR, Nasal     Status: None   Collection Time: 02/11/22  8:27 PM   Specimen: Nasal Mucosa; Nasal Swab  Result Value Ref Range Status   MRSA by PCR Next Gen NOT DETECTED NOT DETECTED Final    Comment: (NOTE) The GeneXpert MRSA Assay (FDA approved for  NASAL specimens only), is one component of a comprehensive MRSA colonization surveillance program. It is not intended to diagnose MRSA infection nor to guide or monitor treatment for MRSA infections. Test performance is not FDA approved in patients less than 15 years old. Performed at [facility], [address] [place], [place] [address]     Coagulation Studies: No results for input(s): "LABPROT", "INR" in the last 72 hours.   Urinalysis: No results for input(s): "COLORURINE", "LABSPEC", "PHURINE", "GLUCOSEU", "HGBUR", "BILIRUBINUR", "KETONESUR", "PROTEINUR", "UROBILINOGEN", "NITRITE", "LEUKOCYTESUR" in the last 72 hours.  Invalid  input(s): "APPERANCEUR"    Imaging: No results found.   Medications:     aspirin EC  81 mg Oral Daily   atorvastatin  80 mg Oral QHS   Chlorhexidine Gluconate Cloth  6 each Topical Daily   Chlorhexidine Gluconate Cloth  6 each Topical Q0600   Darbepoetin Alfa  100 mcg Subcutaneous Q7 days   fluticasone  2 spray Each Nare Daily   gabapentin  300 mg Oral TID   hydroxychloroquine  200 mg Oral Daily   lactulose  30 g Oral BID   melatonin  5 mg Oral QHS   midodrine  10 mg Oral TID WC   nitroGLYCERIN  1 inch Topical Q6H   sevelamer carbonate  2,400 mg Oral TID WC   acetaminophen **OR** acetaminophen, hydrALAZINE, ondansetron **OR** ondansetron (ZOFRAN) IV, traZODone  Assessment/ Plan:  [name] is a 76 y.o.  female  with  hypertension, anemia, multiple myeloma, chronic respiratory failure on home O2, and end stage renal disease on dialysis  was admitted for Melena [K92.1] Rectal bleeding [K62.5] Anticoagulated [Z79.01] Gastrointestinal hemorrhage, unspecified gastrointestinal hemorrhage type [K92.2]   [facility]/MWF/Rt lower forearm AVF   #   End-stage renal disease on hemodialysis.  With volume overload.   Hemodialysis on Friday.  1.5 L removed. Next hemodialysis planned for Monday morning.  Discharge delayed due to [facility] not able to transport patient on Monday.  Hopefully she can be discharged after dialysis on Monday morning with transportation arranged for Wednesday.  # Melena/anemia with chronic kidney disease. Believed due to constipation and hemorrhoids. Followed by GI team, defer colonoscopy at this time and treat constipation.  # Secondary Hyperparathyroidism:  Lab Results  Component Value Date   PTH 242 (H) 09/29/2020   CALCIUM 9.7 02/19/2022   PHOS 3.6 02/16/2022    Calcium and phosphorus at acceptable range. Continue sevelamer with meals.      LOS: 8 Harmeet Singh 7/15/202311:13 AM

## 2022-02-19 NOTE — Progress Notes (Signed)
CSW spoke with patients daughters [name] and Olin Hauser. Olin Hauser contacted [facility] and was told that transportation would not be available for patient on Monday. Olin Hauser stated transportation is in place for dialysis next Wednesday and Friday. Patient will discharge Monday after she received dialysis in the hospital.

## 2022-02-20 DIAGNOSIS — K921 Melena: Secondary | ICD-10-CM | POA: Diagnosis not present

## 2022-02-20 NOTE — Progress Notes (Signed)
[facility]  ROUNDING NOTE   Subjective:   Cassandra Joseph is a 76 year old female with past medical history including hypertension, multiple myeloma, anemia, and end-stage renal disease on hemodialysis.  Patient presents to the emergency department complaining of blood in stool.  Patient has been admitted for Melena [K92.1] Rectal bleeding [K62.5] Anticoagulated [Z79.01] Gastrointestinal hemorrhage, unspecified gastrointestinal hemorrhage type [K92.2]  Patient is known to our practice from previous admissions and receives outpatient dialysis treatments at [facility] on a MWF schedule, supervised by [facility] physicians.    Doing fair today.  Denies any acute complaints.  No shortness of breath.  Able to eat without nausea or vomiting.  Hemodialysis completed on Friday with UF goal of 1.5 L.  Tolerated well.  Reports she is on 3L O2 at home.     Objective:  Vital signs in last 24 hours:  Temp:  [98.2 F (36.8 C)-98.6 F (37 C)] 98.4 F (36.9 C) (07/16 0755) Pulse Rate:  [73-91] 83 (07/16 0755) Resp:  [18-20] 18 (07/16 0755) BP: (130-161)/(66-87) 130/70 (07/16 0755) SpO2:  [96 %-100 %] 100 % (07/16 0755)  Weight change:  Filed Weights   02/16/22 0854 02/16/22 1229 02/18/22 0749  Weight: 111.6 kg 110.8 kg 110.8 kg    Intake/Output: No intake/output data recorded.   Intake/Output this shift:  No intake/output data recorded.  Physical Exam: General: NAD  Head: Normocephalic, atraumatic. Moist oral mucosal membranes  Eyes: Anicteric  Lungs:  Normal effort, [place] O2 3L  Heart: Irregular rate and rhythm  Abdomen:  Soft, nontender, obese  Extremities: trace peripheral edema.  Neurologic: Nonfocal, moving all four extremities  Skin: No lesions  Access: Left aVF    Basic Metabolic Panel: Recent Labs  Lab 02/13/22 1851 02/15/22 0419 02/16/22 0227 02/19/22 0749  NA 138 137 134* 137  K 4.9 3.6 3.9 4.5  CL 97* 98 97* 103  CO2 27 30 30 29   GLUCOSE 109*  83 77 80  BUN 36* 13 26* 30*  CREATININE 5.86* 3.07* 4.58* 4.31*  CALCIUM 9.3 9.2 9.3 9.7  MG  --  2.1  --   --   PHOS 4.0  --  3.6  --      Liver Function Tests: Recent Labs  Lab 02/13/22 1851 02/16/22 0227  ALBUMIN 3.8 3.0*    No results for input(s): "LIPASE", "AMYLASE" in the last 168 hours.  No results for input(s): "AMMONIA" in the last 168 hours.  CBC: Recent Labs  Lab 02/16/22 0227 02/17/22 0522 02/17/22 0852 02/18/22 1235 02/19/22 0749  WBC 5.5 5.5 5.4 5.4 5.8  NEUTROABS  --  3.3  --   --   --   HGB 7.7* 7.8* 8.1* 8.2* 7.9*  HCT 24.7* 24.3* 25.8* 25.9* 24.9*  MCV 96.5 94.9 96.6 94.2 94.7  PLT 197 213 215 220 220     Cardiac Enzymes: No results for input(s): "CKTOTAL", "CKMB", "CKMBINDEX", "TROPONINI" in the last 168 hours.  BNP: Invalid input(s): "POCBNP"  CBG: Recent Labs  Lab 02/13/22 1823 02/17/22 0737 02/17/22 1132  GLUCAP 107* 77 33     Microbiology: Results for orders placed or performed during the hospital encounter of 02/10/22  MRSA Next Gen by PCR, Nasal     Status: None   Collection Time: 02/11/22  8:27 PM   Specimen: Nasal Mucosa; Nasal Swab  Result Value Ref Range Status   MRSA by PCR Next Gen NOT DETECTED NOT DETECTED Final    Comment: (NOTE) The GeneXpert MRSA  Assay (FDA approved for NASAL specimens only), is one component of a comprehensive MRSA colonization surveillance program. It is not intended to diagnose MRSA infection nor to guide or monitor treatment for MRSA infections. Test performance is not FDA approved in patients less than 42 years old. Performed at [facility], [address] [place], [place] [address]     Coagulation Studies: No results for input(s): "LABPROT", "INR" in the last 72 hours.   Urinalysis: No results for input(s): "COLORURINE", "LABSPEC", "PHURINE", "GLUCOSEU", "HGBUR", "BILIRUBINUR", "KETONESUR", "PROTEINUR", "UROBILINOGEN", "NITRITE", "LEUKOCYTESUR" in the last 72  hours.  Invalid input(s): "APPERANCEUR"    Imaging: No results found.   Medications:     aspirin EC  81 mg Oral Daily   atorvastatin  80 mg Oral QHS   Chlorhexidine Gluconate Cloth  6 each Topical Daily   Chlorhexidine Gluconate Cloth  6 each Topical Q0600   Darbepoetin Alfa  100 mcg Subcutaneous Q7 days   fluticasone  2 spray Each Nare Daily   gabapentin  300 mg Oral TID   hydroxychloroquine  200 mg Oral Daily   lactulose  30 g Oral BID   melatonin  5 mg Oral QHS   midodrine  10 mg Oral TID WC   nitroGLYCERIN  1 inch Topical Q6H   sevelamer carbonate  2,400 mg Oral TID WC   acetaminophen **OR** acetaminophen, hydrALAZINE, ondansetron **OR** ondansetron (ZOFRAN) IV, phenylephrine-shark liver oil-mineral oil-petrolatum, traZODone  Assessment/ Plan:  [name] is a 76 y.o.  female  with  hypertension, anemia, multiple myeloma, chronic respiratory failure on home O2, and end stage renal disease on dialysis  was admitted for Melena [K92.1] Rectal bleeding [K62.5] Anticoagulated [Z79.01] Gastrointestinal hemorrhage, unspecified gastrointestinal hemorrhage type [K92.2]   [facility]/MWF/Rt lower forearm AVF   #   End-stage renal disease on hemodialysis.  With volume overload.   Hemodialysis on Friday.  1.5 L removed. No indication for HD today next hemodialysis planned for Monday morning.  Discharge delayed due to [facility] not able to transport patient on Monday.  Hopefully she can be discharged after dialysis on Monday morning with transportation arranged for Wednesday.   # Melena/anemia with chronic kidney disease. Believed due to constipation and hemorrhoids. Followed by GI team, defer colonoscopy at this time and treat constipation.  # Secondary Hyperparathyroidism:  Lab Results  Component Value Date   PTH 242 (H) 09/29/2020   CALCIUM 9.7 02/19/2022   PHOS 3.6 02/16/2022    Calcium and phosphorus at acceptable range. Continue sevelamer with  meals.      LOS: [place] 7/16/202310:42 AM

## 2022-02-20 NOTE — Progress Notes (Signed)
PROGRESS NOTE    Cassandra Joseph  EPP:[id number] DOB: 05-28-1946 DOA: 02/10/2022 PCP: Tracie Harrier, MD    Brief Narrative:  76 year old female with past medical history of hypertension,  chronic anemia, paroxysmal atrial fibrillation, ESRD on hemodialysis, history of hemorrhoids was brought into the hospital for blood in her diapers at home.  Patient is a bedbound baseline is on aspirin and Eliquis.  In the ED, hemoglobin was stable but FOBT was positive but no fresh bleeding.  GI was consulted but patient did not wish to undergo colonoscopy or endoscopy evaluation.  Does have a history of constipation and was put on bowel regimen.    During hospitalization, patient had acute respiratory distress and required BiPAP briefly.  She did have borderline hypotension and midodrine has been reinitiated from home.   7/12: Hemodynamically stable.  Eliquis on hold.  CT angio for GI bleed is negative.  Patient does endorse some small-volume vaginal bleeding.  States that this is not a new issue for her.  7/13: Breathing improved.  Approaching baseline 7/15: Patient was stable for discharge on 7/14 however discharge delayed as family notified us late in the afternoon and that they could not arrange transport for outpatient dialysis on Monday.  As such discharge was delayed.  We attempted to secure transport over the weekend however transportation company is closed.  As such discharge has been delayed and current plan is to keep patient in the hospital, dialyze Monday and discharge afterwards   Assessment & Plan:   Principal Problem:   Melena Active Problems:   Abdominal pain   Anemia   Essential hypertension   ESRD on hemodialysis (HCC)   PAF (paroxysmal atrial fibrillation) (HCC)   OSA on CPAP   Rectal bleeding  Lower GI bleed Patient with history of hemorrhoids.  Hemoglobin has been stable.  No signs of acute bleed.  Was on aspirin and Eliquis as outpatient.  These are currently on hold.  CT  angio negative for bleed.  Patient refused colonoscopy. Plan: No Labs today.  Hemoglobin stable.  Acute on chronic hypoxic respiratory failure Not resolved.  Initially required 6 L of oxygen and was briefly on BiPAP.  Suspect flash pulmonary edema.  Now respiratory status back to baseline.  ESRD on hemodialysis: Nephrology on board.  HD per nephro service.  Discharge delayed because family was not able to arrange transportation for Monday 7/17.  As such we will plan to keep in the hospital until Monday and hopefully discharge afterwards so she can resume normal MWF schedule.   Paroxysmal atrial fibrillation.  Rate controlled.  Continue aspirin.  Eliquis currently on hold.  Will discuss with patient further but plan to hold Eliquis at time of discharge for 1 week, have patient see PCP in office and discuss risk/benefit of restarting anticoagulation   OSA on CPAP: On CPAP at home.  Required BiPAP briefly.  Currently off BiPAP.   Chronic pain syndrome.  Continue gabapentin.   Essential hypertension:  Continue midodrine.  Blood pressure has improved at this time.  We will discontinue amlodipine.   Debility, deconditioning.  Bedbound status.  Has 24/7 oxygen at home and lives with family.  Vaginal bleeding Per patient this has been occurring on and off for several weeks.  She is reluctant to undergo any sort of invasive procedure.  We will check a.m. hemoglobin and reassess.   DVT prophylaxis: SCD Code Status: Full Family Communication: Grandson at bedside 7/12 Disposition Plan: Status is: Inpatient Remains inpatient appropriate because:  Unsafe discharge plan.  Discharge delayed by factors outside of her control.  Family was unable to arrange transport to hemodialysis on Monday   Level of care: Med-Surg  Consultants:  Nephrology  Procedures:  None  Antimicrobials: None   Subjective: Seen and examined.  Resting comfortably in bed.  No visible distress.  No pain  complaints.  Objective: Vitals:   02/19/22 1941 02/20/22 0034 02/20/22 0418 02/20/22 0755  BP: (!) 161/87 (!) 157/66 135/71 130/70  Pulse: 73 82 91 83  Resp: '20  20 18  '$ Temp: 98.5 F (36.9 C)  98.2 F (36.8 C) 98.4 F (36.9 C)  TempSrc: Oral  Oral Oral  SpO2: 98%  96% 100%  Weight:      Height:        Intake/Output Summary (Last 24 hours) at 02/20/2022 1102 Last data filed at 02/20/2022 0500 Gross per 24 hour  Intake --  Output 0 ml  Net 0 ml    Filed Weights   02/16/22 0854 02/16/22 1229 02/18/22 0749  Weight: 111.6 kg 110.8 kg 110.8 kg    Examination:  General exam: NAD Respiratory system: Mild bibasilar crackles.  Normal work of breathing.  3 L Cardiovascular system: S1-S2, RRR, no murmurs, no pedal edema Gastrointestinal system: Soft, NT/ND, normal bowel sounds Central nervous system: Alert and oriented. No focal neurological deficits. Extremities: Symmetric 5 x 5 power. Skin: No rashes, lesions or ulcers Psychiatry: Judgement and insight appear normal. Mood & affect appropriate.     Data Reviewed: I have personally reviewed following labs and imaging studies  CBC: Recent Labs  Lab 02/16/22 0227 02/17/22 0522 02/17/22 0852 02/18/22 1235 02/19/22 0749  WBC 5.5 5.5 5.4 5.4 5.8  NEUTROABS  --  3.3  --   --   --   HGB 7.7* 7.8* 8.1* 8.2* 7.9*  HCT 24.7* 24.3* 25.8* 25.9* 24.9*  MCV 96.5 94.9 96.6 94.2 94.7  PLT 197 213 215 220 559   Basic Metabolic Panel: Recent Labs  Lab 02/13/22 1851 02/15/22 0419 02/16/22 0227 02/19/22 0749  NA 138 137 134* 137  K 4.9 3.6 3.9 4.5  CL 97* 98 97* 103  CO2 '27 30 30 29  '$ GLUCOSE 109* 83 77 80  BUN 36* 13 26* 30*  CREATININE 5.86* 3.07* 4.58* 4.31*  CALCIUM 9.3 9.2 9.3 9.7  MG  --  2.1  --   --   PHOS 4.0  --  3.6  --    GFR: Estimated Creatinine Clearance: 14.2 mL/min (A) (by C-G formula based on SCr of 4.31 mg/dL (H)). Liver Function Tests: Recent Labs  Lab 02/13/22 1851 02/16/22 0227  ALBUMIN 3.8  3.0*   No results for input(s): "LIPASE", "AMYLASE" in the last 168 hours.  No results for input(s): "AMMONIA" in the last 168 hours. Coagulation Profile: Recent Labs  Lab 02/16/22 0227  INR 1.1   Cardiac Enzymes: No results for input(s): "CKTOTAL", "CKMB", "CKMBINDEX", "TROPONINI" in the last 168 hours. BNP (last 3 results) No results for input(s): "PROBNP" in the last 8760 hours. HbA1C: No results for input(s): "HGBA1C" in the last 72 hours. CBG: Recent Labs  Lab 02/13/22 1823 02/17/22 0737 02/17/22 1132  GLUCAP 107* 77 79   Lipid Profile: No results for input(s): "CHOL", "HDL", "LDLCALC", "TRIG", "CHOLHDL", "LDLDIRECT" in the last 72 hours. Thyroid Function Tests: No results for input(s): "TSH", "T4TOTAL", "FREET4", "T3FREE", "THYROIDAB" in the last 72 hours. Anemia Panel: No results for input(s): "VITAMINB12", "FOLATE", "FERRITIN", "TIBC", "IRON", "RETICCTPCT" in the  last 72 hours. Sepsis Labs: No results for input(s): "PROCALCITON", "LATICACIDVEN" in the last 168 hours.   Recent Results (from the past 240 hour(s))  MRSA Next Gen by PCR, Nasal     Status: None   Collection Time: 02/11/22  8:27 PM   Specimen: Nasal Mucosa; Nasal Swab  Result Value Ref Range Status   MRSA by PCR Next Gen NOT DETECTED NOT DETECTED Final    Comment: (NOTE) The GeneXpert MRSA Assay (FDA approved for NASAL specimens only), is one component of a comprehensive MRSA colonization surveillance program. It is not intended to diagnose MRSA infection nor to guide or monitor treatment for MRSA infections. Test performance is not FDA approved in patients less than 11 years old. Performed at [facility], [address], [place], [place] [address]          Radiology Studies: No results found.      Scheduled Meds:  aspirin EC  81 mg Oral Daily   atorvastatin  80 mg Oral QHS   Chlorhexidine Gluconate Cloth  6 each Topical Daily   Chlorhexidine Gluconate Cloth  6 each  Topical Q0600   Darbepoetin Alfa  100 mcg Subcutaneous Q7 days   fluticasone  2 spray Each Nare Daily   gabapentin  300 mg Oral TID   hydroxychloroquine  200 mg Oral Daily   lactulose  30 g Oral BID   melatonin  5 mg Oral QHS   midodrine  10 mg Oral TID WC   nitroGLYCERIN  1 inch Topical Q6H   sevelamer carbonate  2,400 mg Oral TID WC   Continuous Infusions:   LOS: 9 days      Sidney Ace, MD [facility]   If 7PM-7AM, please contact night-coverage  02/20/2022, 11:02 AM

## 2022-02-21 DIAGNOSIS — K921 Melena: Secondary | ICD-10-CM | POA: Diagnosis not present

## 2022-02-21 NOTE — Progress Notes (Signed)
Received from dialysis.  Patient awaiting lunch.  EMS called for transport home for discharge.  Patient aware.

## 2022-02-21 NOTE — Progress Notes (Signed)
Hd tx of 3.5hrs completed, 84.0L total vol processed. No pt acute distress noted. Report given to [facility], rn Total uf removed: 1556m Post hd weight: 116.0kg Post hd v/s:98.5 155/84(105) 74 26 95%

## 2022-02-21 NOTE — Progress Notes (Signed)
[facility]  ROUNDING NOTE   Subjective:   Cassandra Joseph is a 76 year old female with past medical history including hypertension, multiple myeloma, anemia, and end-stage renal disease on hemodialysis.  Patient presents to the emergency department complaining of blood in stool.  Patient has been admitted for Melena [K92.1] Rectal bleeding [K62.5] Anticoagulated [Z79.01] Gastrointestinal hemorrhage, unspecified gastrointestinal hemorrhage type [K92.2]  Patient is known to our practice from previous admissions and receives outpatient dialysis treatments at [facility] on a MWF schedule, supervised by [facility] physicians.    Patient seen and evaluated during dialysis   HEMODIALYSIS FLOWSHEET:  Blood Flow Rate (mL/min): 200 mL/min Arterial Pressure (mmHg): -200 mmHg Venous Pressure (mmHg): 190 mmHg TMP (mmHg): 10 mmHg Ultrafiltration Rate (mL/min): 514 mL/min Dialysate Flow Rate (mL/min): 300 ml/min DO NOT BWL:SLHTDSKAJGOT: 14 DO NOT LXB:WIOMBTDHRCBU: 14 Dialysis Fluid Bolus: Normal Saline Bolus Amount (mL): 300 mL  No complaints at this time Plans to discharge patient after treatment   Objective:  Vital signs in last 24 hours:  Temp:  [97.9 F (36.6 C)-98.6 F (37 C)] 98.5 F (36.9 C) (07/17 1148) Pulse Rate:  [75-100] 80 (07/17 1100) Resp:  [16-27] 18 (07/17 1100) BP: (116-179)/(56-103) 155/84 (07/17 1148) SpO2:  [93 %-100 %] 95 % (07/17 1148) Weight:  [114.7 kg-116 kg] 116 kg (07/17 1154)  Weight change:  Filed Weights   02/18/22 0749 02/21/22 0750 02/21/22 1154  Weight: 110.8 kg 114.7 kg 116 kg    Intake/Output: No intake/output data recorded.   Intake/Output this shift:  Total I/O In: -  Out: 1.5 [Other:1.5]  Physical Exam: General: NAD  Head: Normocephalic, atraumatic. Moist oral mucosal membranes  Eyes: Anicteric  Lungs:  Normal effort,  O2 3L  Heart: Irregular rate and rhythm  Abdomen:  Soft, nontender, obese  Extremities: trace  peripheral edema.  Neurologic: Nonfocal, moving all four extremities  Skin: No lesions  Access: Left aVF    Basic Metabolic Panel: Recent Labs  Lab 02/15/22 0419 02/16/22 0227 02/19/22 0749  NA 137 134* 137  K 3.6 3.9 4.5  CL 98 97* 103  CO2 _0 GLUCOSE 83 77 80  BUN 13 26* 30*  CREATININE 3.07* 4.58* 4.31*  CALCIUM 9.2 9.3 9.7  MG 2.1  --   --   PHOS  --  3.6  --      Liver Function Tests: Recent Labs  Lab 02/16/22 0227  ALBUMIN 3.0*    No results for input(s): "LIPASE", "AMYLASE" in the last 168 hours.  No results for input(s): "AMMONIA" in the last 168 hours.  CBC: Recent Labs  Lab 02/16/22 0227 02/17/22 0522 02/17/22 0852 02/18/22 1235 02/19/22 0749  WBC 5.5 5.5 5.4 5.4 5.8  NEUTROABS  --  3.3  --   --   --   HGB 7.7* 7.8* 8.1* 8.2* 7.9*  HCT 24.7* 24.3* 25.8* 25.9* 24.9*  MCV 96.5 94.9 96.6 94.2 94.7  PLT 197 213 215 220 220     Cardiac Enzymes: No results for input(s): "CKTOTAL", "CKMB", "CKMBINDEX", "TROPONINI" in the last 168 hours.  BNP: Invalid input(s): "POCBNP"  CBG: Recent Labs  Lab 02/17/22 0737 02/17/22 1132  GLUCAP 77 79     Microbiology: Results for orders placed or performed during the hospital encounter of 02/10/22  MRSA Next Gen by PCR, Nasal     Status: None   Collection Time: 02/11/22  8:27 PM   Specimen: Nasal Mucosa; Nasal Swab  Result Value Ref Range Status  MRSA by PCR Next Gen NOT DETECTED NOT DETECTED Final    Comment: (NOTE) The GeneXpert MRSA Assay (FDA approved for NASAL specimens only), is one component of a comprehensive MRSA colonization surveillance program. It is not intended to diagnose MRSA infection nor to guide or monitor treatment for MRSA infections. Test performance is not FDA approved in patients less than 38 years old. Performed at [facility], [address] [place], [place] [address]     Coagulation Studies: No results for input(s): "LABPROT", "INR" in the last 72  hours.   Urinalysis: No results for input(s): "COLORURINE", "LABSPEC", "PHURINE", "GLUCOSEU", "HGBUR", "BILIRUBINUR", "KETONESUR", "PROTEINUR", "UROBILINOGEN", "NITRITE", "LEUKOCYTESUR" in the last 72 hours.  Invalid input(s): "APPERANCEUR"    Imaging: No results found.   Medications:     aspirin EC  81 mg Oral Daily   atorvastatin  80 mg Oral QHS   Chlorhexidine Gluconate Cloth  6 each Topical Daily   Chlorhexidine Gluconate Cloth  6 each Topical Q0600   Darbepoetin Alfa  100 mcg Subcutaneous Q7 days   fluticasone  2 spray Each Nare Daily   gabapentin  300 mg Oral TID   hydroxychloroquine  200 mg Oral Daily   lactulose  30 g Oral BID   melatonin  5 mg Oral QHS   midodrine  10 mg Oral TID WC   nitroGLYCERIN  1 inch Topical Q6H   sevelamer carbonate  2,400 mg Oral TID WC   acetaminophen **OR** acetaminophen, hydrALAZINE, ondansetron **OR** ondansetron (ZOFRAN) IV, phenylephrine-shark liver oil-mineral oil-petrolatum, traZODone  Assessment/ Plan:  [name] is a 76 y.o.  female  with  hypertension, anemia, multiple myeloma, chronic respiratory failure on home O2, and end stage renal disease on dialysis  was admitted for Melena [K92.1] Rectal bleeding [K62.5] Anticoagulated [Z79.01] Gastrointestinal hemorrhage, unspecified gastrointestinal hemorrhage type [K92.2]   [facility]/MWF/Rt lower forearm AVF   #   End-stage renal disease on hemodialysis.  With volume overload.   Receiving treatment today, UF goal 1.5 L as tolerated.  Plan to discharge patient after treatment today to continue outpatient treatments on Wednesday.  Patient cleared to discharge from renal stance.   # Melena/anemia with chronic kidney disease. Believed due to constipation and hemorrhoids. Followed by GI team, defer colonoscopy at this time and treat constipation.  No further incidents of melena during this admission  # Secondary Hyperparathyroidism:  Lab Results  Component  Value Date   PTH 242 (H) 09/29/2020   CALCIUM 9.7 02/19/2022   PHOS 3.6 02/16/2022    We will continue to monitor bone minerals during this admission.  Continue sevelamer with meals.      LOS: [place] 7/17/20231:54 PM

## 2022-02-21 NOTE — TOC Transition Note (Signed)
Transition of Care [facility]) - CM/SW Discharge Note   Patient Details  Name: Cassandra Joseph MRN: [id number] Date of Birth: 10/09/1945  Transition of Care [facility]) CM/SW Contact:  Cassandra Sessions, RN Phone Number: 02/21/2022, 11:21 AM   Clinical Narrative:    Patient to discharge today Cassandra Joseph with [facility] notified.  Requested MD to enter resumption orders  Daughter Cassandra Joseph notified and said she will be at home to accept patient   Cassandra Joseph dialysis liaison notified of discharge  EMS packet printed to floor.  Requested Bedside RN to call EMS when patient returns to [facility] from HD      Barriers to Discharge: Continued Medical Work up   Patient Goals and CMS Choice        Discharge Placement                       Discharge Plan and Services                                     Social Determinants of Health (SDOH) Interventions     Readmission Risk Interventions    02/15/2022    4:15 PM 05/21/2021   12:18 PM 08/11/2020    2:01 PM  Readmission Risk Prevention Plan  Transportation Screening Complete Complete Complete  PCP or Specialist Appt within 3-5 Days   Complete  HRI or [place] Complete  Complete  Social Work Consult for [place] Planning/Counseling Complete  Complete  Palliative Care Screening Not Applicable  Not Applicable  Medication Review [employer]) Complete Complete Complete  PCP or Specialist appointment within 3-5 days of discharge  Complete   HRI or [place]  Complete   SW Recovery Care/Counseling Consult  Complete   [place]  Not Applicable

## 2022-02-21 NOTE — Progress Notes (Signed)
Tx started on patient without incident, cannulated avf x2 with no complications. Will attempt a uf goal of 1500, no complaints from patient at this time. Will continue treatment as prescribed.

## 2022-02-25 ENCOUNTER — Telehealth: Payer: Self-pay

## 2022-02-25 ENCOUNTER — Encounter: Payer: Self-pay | Admitting: Internal Medicine

## 2022-02-25 NOTE — Telephone Encounter (Signed)
Spoke with patient's daughter [name] and scheduled a telephonic Palliative Consult for 03/03/22 @ 2:30 PM.  Consent obtained; updated Netsmart, Team List and Epic.

## 2022-03-03 ENCOUNTER — Encounter: Payer: Self-pay | Admitting: Nurse Practitioner

## 2022-03-03 ENCOUNTER — Other Ambulatory Visit: Payer: Medicare Other | Admitting: Nurse Practitioner

## 2022-03-03 ENCOUNTER — Telehealth: Payer: Self-pay | Admitting: Nurse Practitioner

## 2022-03-03 DIAGNOSIS — Z515 Encounter for palliative care: Secondary | ICD-10-CM

## 2022-03-03 DIAGNOSIS — R0602 Shortness of breath: Secondary | ICD-10-CM

## 2022-03-03 DIAGNOSIS — R5381 Other malaise: Secondary | ICD-10-CM

## 2022-03-03 DIAGNOSIS — N186 End stage renal disease: Secondary | ICD-10-CM

## 2022-03-03 NOTE — Progress Notes (Signed)
[facility] Palliative Care Consult Note Telephone: [phone]  Fax: [phone]   Date of encounter: 03/03/22 4:49 PM PATIENT NAME: Cassandra Joseph [address] [address]   [phone] (home)  DOB: 08-11-1945 MRN: [id number] PRIMARY CARE PROVIDER:    Tracie Harrier, MD,  [address] [place] [address] [place] PROVIDER:   Tracie Harrier, MD [address] [place],  [place] [address] [phone]  RESPONSIBLE PARTY:    Contact Information     Name Relation Home Work [place] Daughter [phone]  [phone]   Cassandra Joseph Daughter   [phone]   Cassandra Joseph Daughter   [phone]      Due to the COVID-19 crisis, this visit was done via telemedicine from my office and it was initiated and consent by this patient and or family.  I connected with daughter Cassandra Joseph, with  Cassandra Joseph OR PROXY on 03/03/22 by a telephone as video not available enabled telemedicine application and verified that I am speaking with the correct person using two identifiers.   I discussed the limitations of evaluation and management by telemedicine. The patient expressed understanding and agreed to proceed.  Palliative Care was asked to follow this patient by consultation request of  Cassandra Harrier, MD to address advance care planning and complex medical decision making. This is the initial visit.                                   ASSESSMENT AND PLAN / RECOMMENDATIONS:  Symptom Management/Plan: 1. Advance Care Planning;  Will need further discussions with goc, code status  2. Goals of Care: Goals include to maximize quality of life and symptom management. Our advance care planning conversation included a discussion about:    The value and importance of advance care planning  Exploration of personal, cultural or spiritual beliefs  that might influence medical decisions  Exploration of goals of care in the event of a sudden injury or illness  Identification and preparation of a healthcare agent  Review and updating or creation of an advance directive document.  3. Shortness of breath secondary to ESRD/CHF; discussed monitoring symptoms, weights at HD as Cassandra Joseph is bed bound at home. Monitor edema, reviewed medications. Currently stable  4. Debility severe secondary to obesity, decline, ESRD, multiple co-morbidities. Discussed limitations, hoyer life, will have PC SW consult to look at further caregiver help at home. Restorative exercises as able. Nutrition discussed.  5. Palliative care encounter; Palliative care encounter; Palliative medicine team will continue to support patient, patient's family, and medical team. Visit consisted of counseling and education dealing with the complex and emotionally intense issues of symptom management and palliative care in the setting of serious and potentially life-threatening illness  Follow up Palliative Care Visit: Palliative care will continue to follow for complex medical decision making, advance care planning, and clarification of goals. Return 2 weeks for [facility] RN/SW then 4 weeks for [facility] NP  I spent 35 minutes providing this consultation. More than 50% of the time in this consultation was spent in counseling and care coordination. PPS: 30% Chief Complaint: Initial palliative consult for complex medical decision making, address goals, manage ongoing symptoms HISTORY OF PRESENT ILLNESS:  Cassandra Joseph is a 76 y.o. 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 Hospitalized 02/10/2022 to  02/21/2022 for blood in her diapers at home as she is bed bound at baseline on eliquis and aspirin. GI consulted, Cassandra Joseph deferred colonoscopy/endoscopy evaluation. She developed while hospitalized acute respiratory distress, hypotension requiring bipap and midodrine. Stabilized and d/c home. I called Cassandra Joseph daughter for Cassandra Joseph for initial pc visit telephonic as video not available. We talked about purpose of pc visit. We talked about past medical history, last time Cassandra Joseph was independent, social and family history. Cassandra Joseph resides in her own home, bed bound with her four adult children rotating taking care of her as she requires 24 hr care. She requires to be Hoyer or lifted to w/c, bathed, dressed. She is incontinence of bowel. Cassandra Joseph does require her meals to be prepared, Cassandra Joseph asked about meals on wheels and other resources that would be available. Cassandra Joseph endorses Cassandra Joseph does get a few hours a month from [facility] but that is not enough. We talked about caregiver stress, fatigue. Cassandra Joseph endorses it is very hard to get her to MD appointments, discussed in home primary [facility]. Cassandra Joseph wishes to have a referral, will send. Discussed will ask PC SW and RN to make f/u visit in 2 to 3 weeks, then PC NP 4 to 6 weeks dependent on needs. We talked about symptoms, ros, appetite, weight. We talked about medical goals. We talked about role pc in poc, f/u visits scheduled. Therapeutic listening, emotional support provided, Questions answered. Contact information provided.   History obtained from review of EMR, discussion with daughter Cassandra Joseph with Cassandra Joseph.  I reviewed available labs, medications, imaging, studies and related documents from the EMR.  Records reviewed and summarized above.   ROS 10 point system reviewed all negative except HPI  Physical Exam: deferred CURRENT PROBLEM LIST:  Patient Active Problem List   Diagnosis Date Noted   Rectal bleeding 02/11/2022    Anemia 02/10/2022   Melena 02/10/2022   Abdominal pain 02/10/2022   Hypotension 11/19/2021   ESRD on hemodialysis (HCC) 04/28/2021   PAF (paroxysmal atrial fibrillation) (HCC) 04/28/2021   Chronic respiratory failure with hypoxia (HCC) 04/28/2021   OSA on CPAP 04/28/2021   Multiple myeloma in relapse (HCC) 04/28/2021   Rheumatoid arthritis with positive rheumatoid factor (HCC) 09/27/2020   Acute on chronic diastolic CHF (congestive heart failure) (HCC) 01/18/2017   HLD (hyperlipidemia) 01/18/2017   Essential hypertension 11/09/2016   Multiple myeloma (HCC) 11/09/2016   Metabolic syndrome 11/09/2016   PAST MEDICAL HISTORY:  Active Ambulatory Problems    Diagnosis Date Noted   Essential hypertension 11/09/2016   Multiple myeloma (HCC) 11/09/2016   Metabolic syndrome 11/09/2016   Acute on chronic diastolic CHF (congestive heart failure) (HCC) 01/18/2017   HLD (hyperlipidemia) 01/18/2017   Rheumatoid arthritis with positive rheumatoid factor (HCC) 09/27/2020   ESRD on hemodialysis (HCC) 04/28/2021   PAF (paroxysmal atrial fibrillation) (HCC) 04/28/2021   Chronic respiratory failure with hypoxia (HCC) 04/28/2021   OSA on CPAP 04/28/2021   Multiple myeloma in relapse (HCC) 04/28/2021   Hypotension 11/19/2021   Anemia 02/10/2022   Melena 02/10/2022   Abdominal pain 02/10/2022   Rectal bleeding 02/11/2022   Resolved Ambulatory Problems    Diagnosis Date Noted   Atypical chest pain 11/09/2016   ESRD on hemodialysis (HCC) 11/09/2016     Morbid obesity (HCC) 11/09/2016   Acute pulmonary edema (HCC) 06/23/2019   SOB (shortness of breath)    History of anemia due to chronic kidney disease    ESRD (end stage renal disease) (HCC)    Pneumonia due to COVID-19 virus 08/07/2020   Viral pneumonia 08/08/2020   Septic arthritis (HCC) 09/27/2020   Left leg pain    Effusion of left hip 09/28/2020   Chronic anticoagulation 04/28/2021   Rheumatoid arthritis (HCC) 04/28/2021   Multifocal  pneumonia 04/28/2021   CHF (congestive heart failure) (HCC) 04/28/2021   Past Medical History:  Diagnosis Date   Brenner tumor    History of nuclear stress test    Hypertension    Ischemic stroke (HCC)    Osteomyelitis (HCC)    Renal disorder    TIA (transient ischemic attack)    SOCIAL HX:  Social History   Tobacco Use   Smoking status: Former    Types: Cigarettes   Smokeless tobacco: Never   Tobacco comments:    Quit in 2007  Substance Use Topics   Alcohol use: No   FAMILY HX:  Family History  Problem Relation Age of Onset   Seizures Mother    Hypertension Mother    Throat cancer Father    Breast cancer Neg Hx       ALLERGIES:  Allergies  Allergen Reactions   Shellfish Allergy Shortness Of Breath   Shellfish Allergy Other (See Comments)    Unknown reaction     PERTINENT MEDICATIONS:  Outpatient Encounter Medications as of 03/03/2022  Medication Sig   amLODipine (NORVASC) 10 MG tablet Take 5 mg by mouth daily.   ASPIRIN LOW DOSE 81 MG EC tablet Take 81 mg by mouth daily.   atorvastatin (LIPITOR) 80 MG tablet Take 1 tablet (80 mg total) by mouth daily.   b complex-vitamin c-folic acid (NEPHRO-VITE) 0.8 MG TABS tablet Take 1 tablet by mouth daily.   cinacalcet (SENSIPAR) 60 MG tablet Take 60 mg by mouth 2 (two) times daily.   docusate sodium (COLACE) 100 MG capsule Take 100 mg by mouth 2 (two) times daily.   epoetin alfa (EPOGEN) 4000 UNIT/ML injection Inject 1 mL (4,000 Units total) into the vein Every Tuesday,Thursday,and Saturday with dialysis. (Patient taking differently: Inject 4,000 Units into the vein every Monday, Wednesday, and Friday with hemodialysis.)   fluticasone (FLONASE) 50 MCG/ACT nasal spray Place 1-2 sprays into both nostrils daily at 12 noon.   gabapentin (NEURONTIN) 300 MG capsule Take 300 mg by mouth 3 (three) times daily.   Hydrocortisone, Perianal, 1 % CREA Apply topically.   hydroxychloroquine (PLAQUENIL) 200 MG tablet Take 200 mg by mouth  daily.   midodrine (PROAMATINE) 10 MG tablet Take 1 tablet (10 mg total) by mouth 3 (three) times daily with meals.   Nutritional Supplements (FEEDING SUPPLEMENT, NEPRO CARB STEADY,) LIQD Take 237 mLs by mouth 3 (three) times daily between meals.   polyethylene glycol (MIRALAX / GLYCOLAX) 17 g packet Take 17 g by mouth 2 (two) times daily.   senna (SENOKOT) 8.6 MG tablet Take 2 tablets by mouth at bedtime.   sevelamer carbonate (RENVELA) 800 MG tablet Take 1,600-2,400 mg by mouth 3 (three) times daily. Take 3 tabs with meals and 2 tabs with snacks   No facility-administered encounter medications on file as of 03/03/2022.   Thank you for the opportunity to participate in the care of Cassandra. Miano.  The palliative care team will continue to follow. Please call our office   at [phone] if we can be of additional assistance.   Christin Z Gusler, NP ,   

## 2022-03-03 NOTE — Telephone Encounter (Signed)
Telemedicine visit see note

## 2022-03-08 ENCOUNTER — Telehealth: Payer: Self-pay

## 2022-03-08 NOTE — Telephone Encounter (Signed)
Outreached daughter, Sherene Sires, to schedule in person PC visit for patient, per [facility] NP - C. Gusler request.  No answer. Unable to LVM due to mailbox not being set up. Will attempt again at later date/time.

## 2022-03-08 NOTE — Telephone Encounter (Signed)
TC to patient to schedule in person PC visit, per [facility] NP C. Gusler request.   Visit scheduled for 8/10 '@1pm'$  per patient request, with RN/SW

## 2022-03-17 ENCOUNTER — Other Ambulatory Visit: Payer: Medicare Other

## 2022-03-17 VITALS — BP 142/86 | HR 93 | Temp 98.0°F

## 2022-03-17 DIAGNOSIS — Z515 Encounter for palliative care: Secondary | ICD-10-CM

## 2022-03-17 NOTE — Progress Notes (Signed)
COMMUNITY PALLIATIVE CARE SW NOTE  PATIENT NAME: Cassandra Joseph DOB: 28-Feb-1946 MRN: [id number]  PRIMARY CARE PROVIDER: Tracie Harrier, MD  RESPONSIBLE PARTY:  Acct ID - Guarantor Home Phone Work Phone Relationship Acct Type  [id number] [name]601 [phone]  Self P/F     [address], [place], [place] [address]     PLAN OF CARE and INTERVENTIONS:              GOALS OF CARE/ ADVANCE CARE PLANNING: Goals include to maximize quality of life and symptom management. Our advance care planning conversation included a discussion about:    The value and importance of advance care planning  Review and updating or creation of an advance directive document.  Code Status: Patient is Full Code. SW discussed ACD and left POA and five wishes in the home for family to review.   Palliative care encounter: Palliative medicine team will continue to support patient, patient's family, and medical team. Visit consisted of counseling and education dealing with the complex and emotionally intense issues of symptom management and palliative care in the setting of serious and potentially life-threatening illness. SW completed visit with PC RN [name], met with patient, patient's daughter Cassandra Joseph present during visit. Follow up call made to patients daughter, Cassandra Joseph, per her request to update on visit.  Functional changes/updates: Patient has not had a decline since previous PC visit. No falls reported. ADLS: MAX A - total care with ADL's. Utilizes hoyer lift for transfers and is WC dependent.  Appetite: Good. Patient voiced complaint of nausea the past two days. Psychosocial assessment: completed.   In-Home support: Daughter is primary caregiver. Patient/family voiced the need for additional in home support to assist with bathing. Patient has private caregivers that come in MWF for 3 hours through [place] covered by [employer]. Cassandra Joseph was renewed this month for  another 6 months. PACE program was discussed as well with daughter, Cassandra Joseph, whom shared that she would be willing to look into this program for patient. Daughter is also attempting to have patients partial medicaid benefits reviewed for full benefits.    Transportation: Patient uses [facility] for dialysis. SW advised patient and family to outreach [place] DSS for medical transportation [phone] when Cassandra Joseph is not available.  Food: Patient declined [facility]OW's referral, states that her daughter prepares meals for her. Patient was receiving food stamps, however benefits has stopped and daughter has submitted new application, since she has since received a request for additional information of which the deadline ahs passed (03/11/22) daughter to outreach foodstamp caseworker, Cassandra Joseph.  Financial concerns: Patient voiced concern about monthly bills and rent, currently not behind in bills. SW provided information about DSS weatherization program to assist with utility bills.  Long term goal: patient desires to remain in her home with family support and avoid placement. SW advised patient and family discuss patients wishes and long term goals together. Ongoing support/resources will continue to be offered if needed.   SW discussed goals, reviewed care plan, provided emotional support, used active and reflective listening in the form of reciprocity emotional response. Questions and concerns were addressed. The patient/family was encouraged to call with any additional questions and/or concerns. PC Provided general support and encouragement, no other unmet needs identified. Will continue to follow.  3.         PATIENT/CAREGIVER EDUCATION/ COPING:   Appearance: well groomed, appropriate given situation  Mental Status: alert and oriented  Eye Contact: good Speech: Normal rate, volume,  and tone  Mood: Normal and calm Affect: Congruent to endorsed mood, full ranging Interaction Style: Cooperative  Patient A&O and  able to make needs known. Patient actively engaged in conversation and answer questions. Patient able to answer questions about hx of life background accurately. PHQ-9 is 0.   Caregivers: Daughter, Cassandra Joseph, is mostly present in the home and assist with day-to-day care and routine. Daughter Cassandra Joseph, assist with managing MD appointments. Cassandra Joseph express caregiver fatigue . Family is supportive and provides physical and emotional support to patient.   4.         PERSONAL EMERGENCY PLAN:  family/patient will call 9-1-1 for emergencies.    5.         COMMUNITY RESOURCES COORDINATION/ HEALTH CARE NAVIGATION:  daughters manages patients care.  6.          FINANCIAL CONCERNS/NEEDS:                         Primary Health Insurance: Medicare Secondary Health Insurance: BCBS and Medicaid MQB Prescription Coverage: Yes, no history of difficulty obtaining or affording prescriptions reported  SOCIAL HX:  Social History   Tobacco Use   Smoking status: Former    Types: Cigarettes   Smokeless tobacco: Never   Tobacco comments:    Quit in 2007  Substance Use Topics   Alcohol use: No    CODE STATUS: FULL CODE  ADVANCED DIRECTIVES: discussed. Forms left with patient/family MOST FORM COMPLETE:  N HOSPICE EDUCATION PROVIDED: N  PPS:30  Time spent: 1 hr.       Doreene Eland, LCSW

## 2022-03-17 NOTE — Progress Notes (Signed)
PATIENT NAME: Cassandra Joseph DOB: [birth year] MRN: [id number]  PRIMARY CARE PROVIDER: Tracie Harrier, MD  RESPONSIBLE PARTY:  Acct ID - Guarantor Home Phone Work Phone Relationship Acct Type  [id number] [name]  Self P/F     [address] [place], [place], [place] [address]   Joint visit completed with patient, daughter Jeannene [place], and [place], [place] call made to daughter Sherene Sires with [place], SW after visit.   Constipation:  Endorses constipation and hemorrhoids.  Currently not taking miralax as this upsets her stomach.  Only on senokot and colace.  Daughter is administering this routinely on the weekend and dialysis days.  Patient avoids taking stool softners the day before her dialysis.   Nausea: Patient endorses nausea last night and took pepto-bismol which was helpful.  She has some present  this morning but states she does not need it at this time.  Mobility:  Wheelchair bound and requires the use of a hoyer lift for transfers.   Patient advises she is able to move around short distances once she is in the wheelchair.  No recent falls reported.   Shortness of breath:  Patient is currently on O2 at 3 L via [place].   Patient endorses occasional issues of shortness of breath.   Resources:  Spoke with daughter Sherene Sires over the phone and she is interested in a home based agency PCP provider.  [facility] as an option that PC NP recommended on last visit.  Will follow up with [facility] to see if referral was received. SW reviewed other resource options with family.   CODE STATUS: Full ADVANCED DIRECTIVES: N MOST FORM: No PPS: 30%   PHYSICAL EXAM:   VITALS: Today's Vitals   03/17/22 1309  BP: (!) 142/86  Pulse: 93  Temp: 98 F (36.7 C)  SpO2: 93%  PainSc: 0-No pain    LUNGS:  clear with right lower lobe rales CARDIAC: Cor RRR}  EXTREMITIES: 1+ pedal edema bilaterally SKIN: Skin color, texture, turgor normal. No rashes or lesions or normal.  Right arm  dialysis shunt in place.  NEURO: positive for gait problems       Lorenza Burton, RN

## 2022-03-21 ENCOUNTER — Telehealth: Payer: Self-pay | Admitting: Nurse Practitioner

## 2022-03-21 NOTE — Telephone Encounter (Signed)
I called Cassandra Joseph as a request to return call. Cassandra Joseph endorses she was checking to see if she needed to turn anything else in. Discussed pc, no further things from provider aspect she needs to submit.

## 2022-04-07 ENCOUNTER — Other Ambulatory Visit: Payer: Medicare Other | Admitting: Nurse Practitioner

## 2022-04-28 ENCOUNTER — Encounter: Payer: Medicare Other | Admitting: Nurse Practitioner

## 2022-04-28 NOTE — Progress Notes (Signed)
This encounter was created in error - please disregard.

## 2022-11-25 ENCOUNTER — Other Ambulatory Visit: Payer: Medicare Other

## 2022-11-25 DIAGNOSIS — Z515 Encounter for palliative care: Secondary | ICD-10-CM

## 2022-11-25 NOTE — Progress Notes (Signed)
TELEPHONE ENCOUNTER  Palliative care connected with patients daughter, Pam, to discuss new PC referral from patients home provider, [facility].   Daughter shared that patient had not been feeling to good these past few days. Patient has had more SOB and weakness, with less bed mobility. Daughter shared that patient had to have an extra day of dialysis yesterday to draw more fluid off due to SOB. Patient goes to dialysis MWF.  Plan: PC to f/u 4Elita Quick/30 .

## 2022-12-06 ENCOUNTER — Other Ambulatory Visit: Payer: Medicare Other

## 2022-12-06 DIAGNOSIS — Z515 Encounter for palliative care: Secondary | ICD-10-CM

## 2022-12-06 NOTE — Progress Notes (Signed)
COMMUNITY PALLIATIVE CARE SW NOTE  PATIENT NAME: Cassandra Joseph DOB: Apr 27, 1946 MRN: [id number]  PRIMARY CARE PROVIDER: Barbette Reichmann, MD  RESPONSIBLE PARTY:  Acct ID - Guarantor Home Phone Work Phone Relationship Acct Type  [id number] Ardeth Perfect* [phone]  Self P/F     [address], [place], [place] [address]     PLAN OF CARE and INTERVENTIONS:              GOALS OF CARE/ ADVANCE CARE PLANNING:    Goals include to maximize quality of life and assist with pain management. Our advance care planning conversation included a discussion about:    The value and importance of advance care planning  Review and updating or creation of an advance directive document.                          Code Status: FULL CODE.                          ACD: none in place. MOST form left in the home, will re-visit at next visit. SW also left POA documents in the home.                         GOC: ongoing discussion. Patient wishes to be transported to hospital if needed with full scope of treatment. Patient wishes to continue with dialysis for as long as she can.  2.        SOCIAL/EMOTIONAL/SPIRITUAL ASSESSMENT/ INTERVENTIONS:         Palliative care encounter: SW completed initial in home visit with patient and patients daughter, Cassandra Joseph. PC criteria reviewed and discussed and consent form signed. Patient was a previous PC patient, new referral received due to functional decline.   Presenting problem: patient 77 y.o. female with multiple medical problems including end stage renal failure. Patient received sitting in WC in living room. Patient shares that she feels okay today. No pain reported. SW reviewed reasoning for new PC referral being that home PCP provider noted concern of patient being weaker, decreased appetite, more sob and requiring additional dialysis day during week of visit. Patient confirmed that she was not feeling well that week, however she is feeling better now with increased  appetite. Hospice vs PC discussed during this visit.  Mobility: patient is WC bound and MAX - total assist with bed mobility and ADL's. patient is able to feed herself.  Medications: no concerns.   CHF: patient is on 3 LPM of  O2. No concerns of SHOB this visit. Education provided on keeping legs elevated to prevent bilateral swelling and fluid retention.  Sleeping: no concerns.   Appetite: Patient share his appetite is fair.   Psychosocial assessment: completed.   In home support: Patient lives alone but has daughter staying with her overnight sometimes and providing care during the day. No other in home support.   Transportation: none. Patient uses [facility].  Food: no food insecurities witnessed.    Safety and long term planning: patient feels safe in her home and desires to remain in her home.    SW discussed goals, reviewed care plan, provided emotional support, used active and reflective listening in the form of reciprocity emotional response. Questions and concerns were addressed. The patient was encouraged to call with any additional questions and/or concerns. PC Provided general support and encouragement, no other unmet needs identified.  PC will follow up in 2 week.   3.         PATIENT/CAREGIVER EDUCATION/ COPING:   Appearance: well groomed, appropriate given situation  Mental Status: Alert and oriented. Eye Contact: good  Thought Process: rational  Thought Content: not assessed  Speech: good/clear  Mood: Normal and calm Affect: Congruent to endorsed mood, full ranging Insight: good Judgement: good Interaction Style: Cooperative   Patient A&O and able to make needs known. Patient able to engage in conversation and answer all questions appropriately. PHQ9: 0,   4.         PERSONAL EMERGENCY PLAN:  Patient will call 9-1-1 for emergencies.    5.         COMMUNITY RESOURCES COORDINATION/ HEALTH CARE NAVIGATION:  patient and daughter manages her care.    6.       FINANCIAL CONCERNS/NEEDS: none                         Primary Health Insurance: medicare Secondary Health Insurance: none Prescription Coverage: no. patient is covered by compassionate funds.     SOCIAL HX:  Social History   Tobacco Use   Smoking status: Former    Types: Cigarettes   Smokeless tobacco: Never   Tobacco comments:    Quit in 2007  Substance Use Topics   Alcohol use: No    CODE STATUS: FULL CODE ADVANCED DIRECTIVES: N. Forms left in home MOST FORM COMPLETE:  {N. Form left in the home HOSPICE EDUCATION PROVIDED: Y  PPS: 30%  TIME SPENT: 40 MIN      [place], [place]

## 2022-12-22 ENCOUNTER — Other Ambulatory Visit: Payer: Medicare Other

## 2022-12-22 DIAGNOSIS — Z515 Encounter for palliative care: Secondary | ICD-10-CM

## 2022-12-22 NOTE — Progress Notes (Signed)
COMMUNITY PALLIATIVE CARE SW NOTE  PATIENT NAME: Cassandra Joseph DOB: 1945/11/23 MRN: [id number]  PRIMARY CARE PROVIDER: Barbette Reichmann, MD  RESPONSIBLE PARTY:  Acct ID - Guarantor Home Phone Work Phone Relationship Acct Type  [id number] Ardeth Perfect* [phone]  Self P/F     [address], [place], [place] [address]     PLAN OF CARE and INTERVENTIONS:              Palliative care encounter: SW completed home vist with patient and patient daughter, Elita Quick. Patient was more closed off today and did not share much. All responses were one word answers or short answers to open ended questions, however patient shares that "she's fine" today.  ACD: daughter shared that Equity provider reviewed, confirmed and signed MOST form and will be mailing it back to patient. SW discussed ACD/POA documents again with patient and daughter as daughter shared that patient had declined to sign them. Patient shared she did not kn ow why she didn't want to sign them at the time. Daughter states that maybe SW can review again at next visit.  Functional changes/update: patient and daughter declined any changes since previous PC visit. Patient state she is feeling okay. Denies pain. Mobility is the same (WC bound, MAX A with ADL's). Continues to go to dialysis.   PC to follow up in 3-4 week.s      SOCIAL HX:  Social History   Tobacco Use   Smoking status: Former    Types: Cigarettes   Smokeless tobacco: Never   Tobacco comments:    Quit in 2007  Substance Use Topics   Alcohol use: No    CODE STATUS: full code  ADVANCED DIRECTIVES: ongoing discussion. Patient/family has documents to complete MOST FORM COMPLETE:  Y HOSPICE EDUCATION PROVIDED: N  PPS:30%      [place], [place]

## 2023-01-19 ENCOUNTER — Other Ambulatory Visit: Payer: Medicare Other

## 2023-01-19 DIAGNOSIS — Z515 Encounter for palliative care: Secondary | ICD-10-CM

## 2023-01-19 NOTE — Progress Notes (Signed)
COMMUNITY PALLIATIVE CARE SW NOTE  PATIENT NAME: Cassandra Joseph DOB: [birth year] MRN: [id number]  PRIMARY CARE PROVIDER: Barbette Reichmann, MD  RESPONSIBLE PARTY:  Acct ID - Guarantor Home Phone Work Phone Relationship Acct Type  [id number] Ardeth Perfect* [phone]  Self P/F     [address], [place], [place] [address]     PLAN OF CARE and INTERVENTIONS:             TC to patient/family to complete telephonic encounter to f/u on ACD documentation. No answer. SW LVM.      SOCIAL HX:  Social History   Tobacco Use   Smoking status: Former    Types: Cigarettes   Smokeless tobacco: Never   Tobacco comments:    Quit in 2007  Substance Use Topics   Alcohol use: No        Cassandra Joseph, [place]

## 2023-02-20 IMAGING — CT CT CHEST HIGH RESOLUTION W/O CM
2 of 5 series · 14 of 36 positions shown, 17 images · non-contrast
Comparison: 11/09/2016 chest CT angiogram.

CLINICAL DATA: Inpatient. Hypercalcemia of uncertain etiology.
Concern for sarcoidosis.

EXAM:
CT CHEST WITHOUT CONTRAST
TECHNIQUE: Multidetector CT imaging of the chest was performed following the
standard protocol without intravenous contrast. High resolution
imaging of the lungs, as well as inspiratory and expiratory imaging,
was performed.

[Series 3: thorax · axial · 0.72mm/px · z∈[-285,-45]mm · 11 of 139 slices shown, 14 images]
[im 13/139  mediastinal]
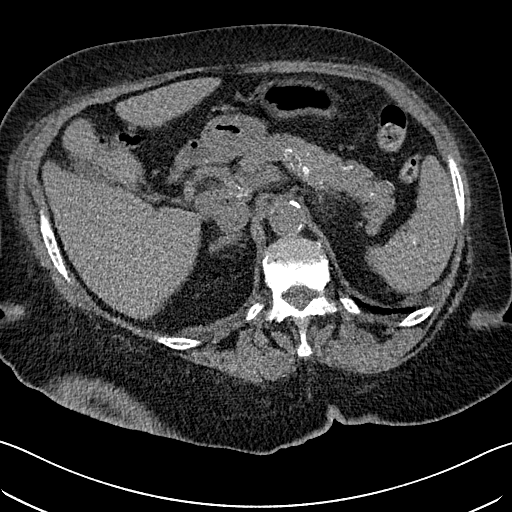
[im 13/139  lung]
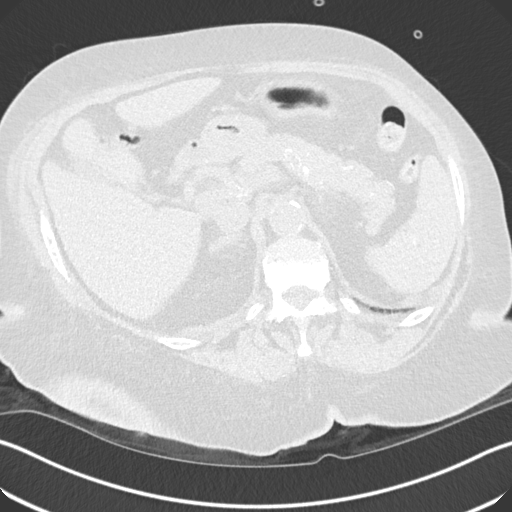
[im 25/139  lung]
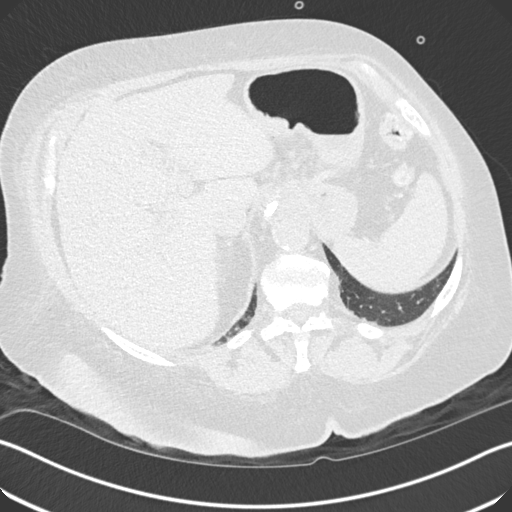
[im 37/139  lung]
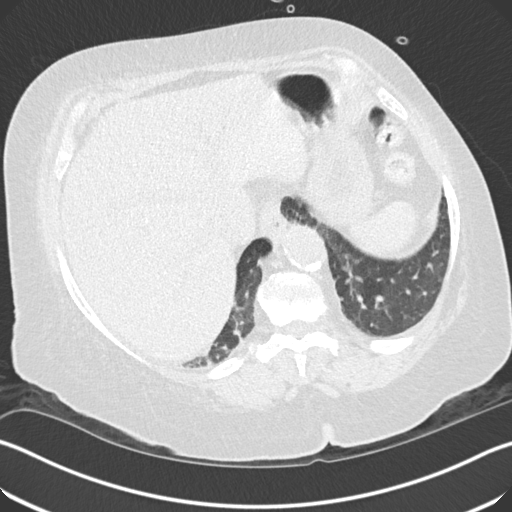
[im 49/139  lung]
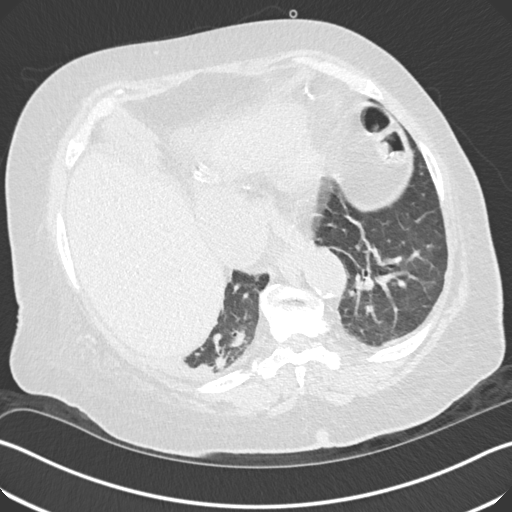
[im 61/139  mediastinal]
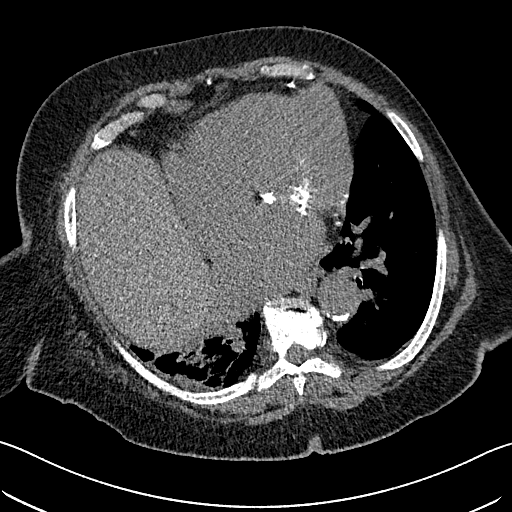
[im 61/139  lung]
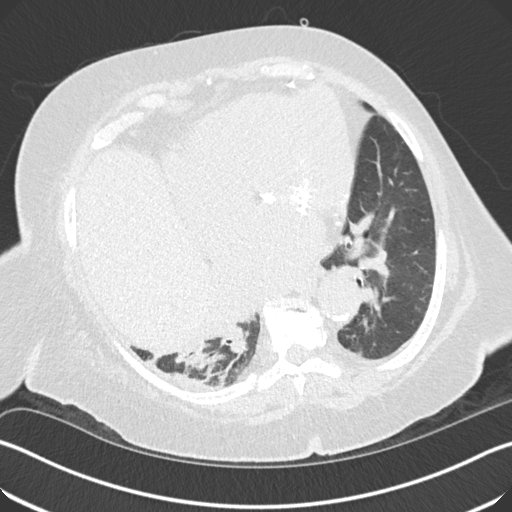
[im 73/139  lung]
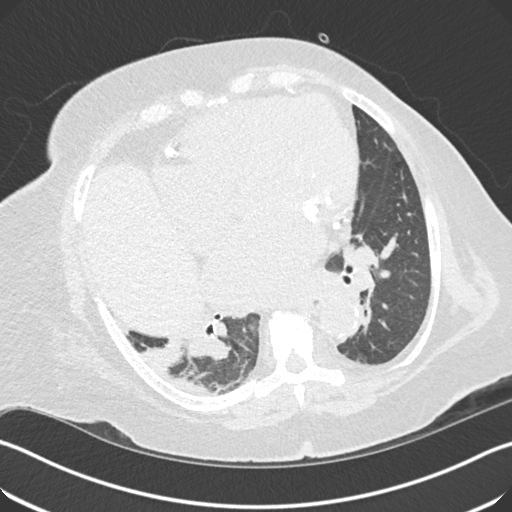
[im 85/139  lung]
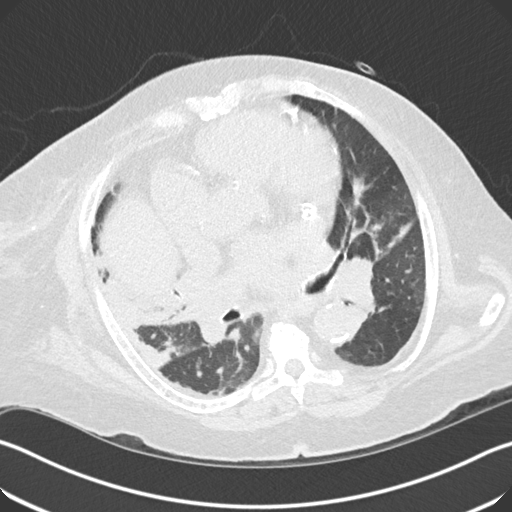
[im 97/139  lung]
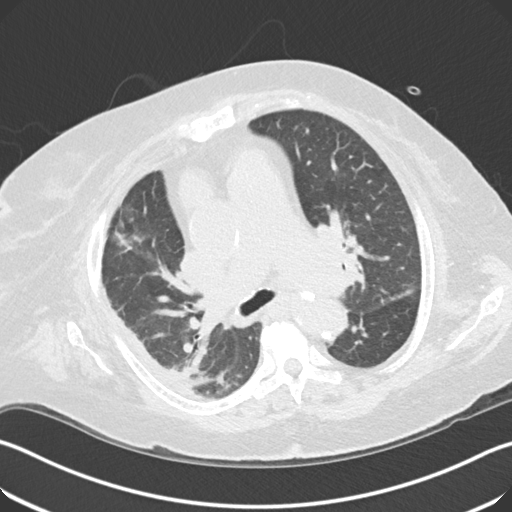
[im 109/139  mediastinal]
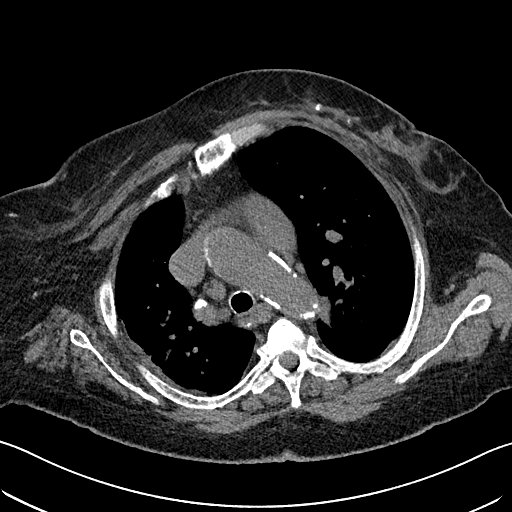
[im 109/139  lung]
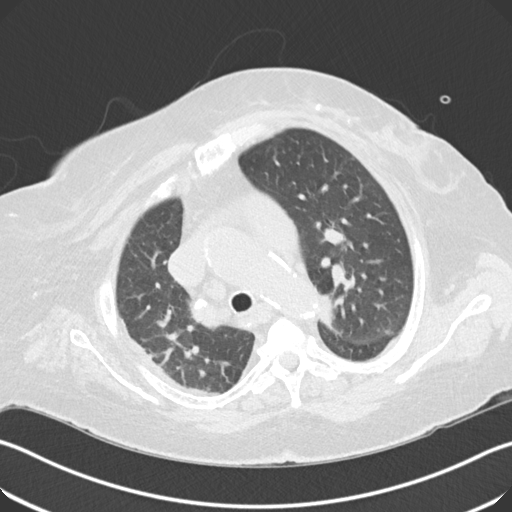
[im 121/139  lung]
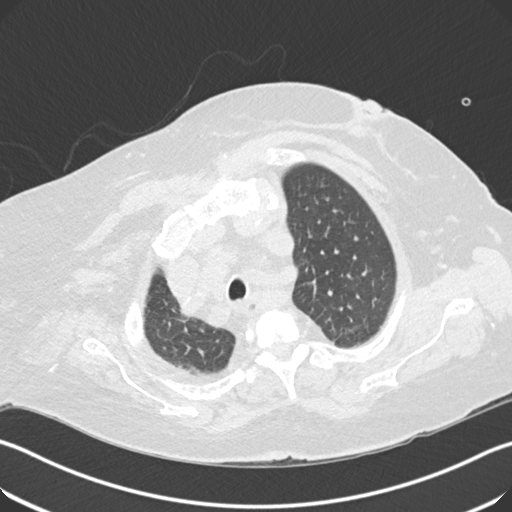
[im 133/139  lung]
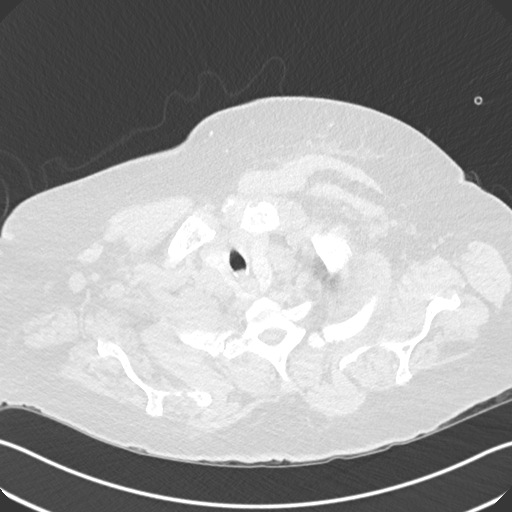

[Series 9: coronal · coronal · 0.55mm/px · 3 of 101 slices shown]
[im 21/101  lung]
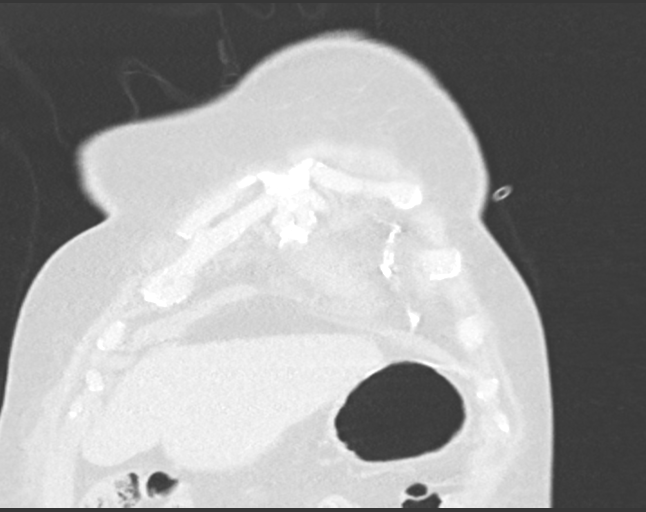
[im 41/101  lung]
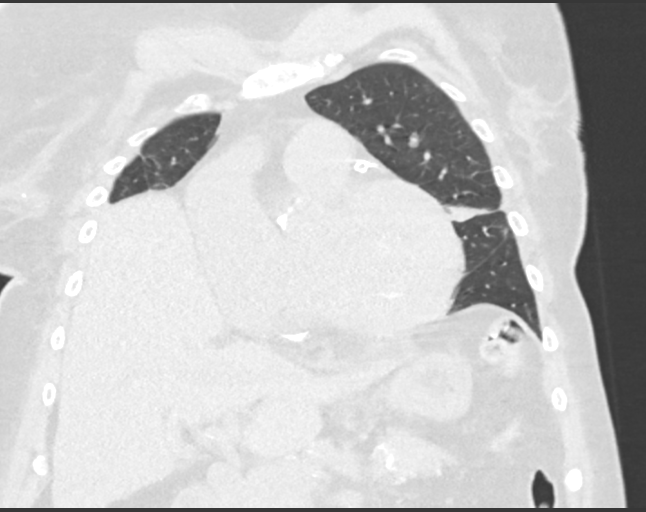
[im 61/101  lung]
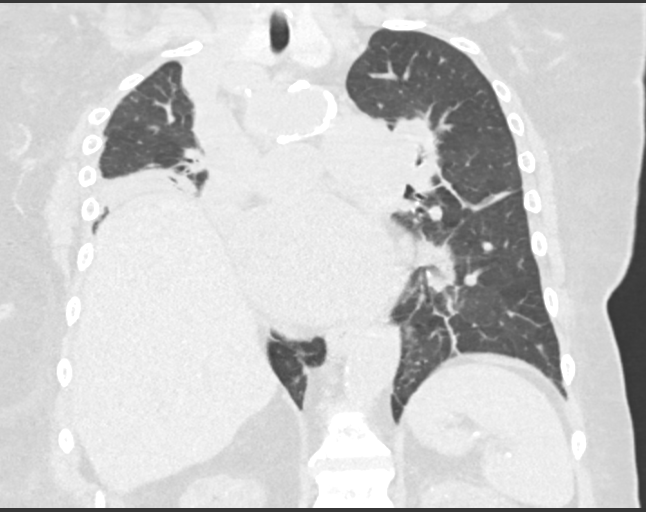

[14 of 36 positions shown; findings below may reference images not displayed]

FINDINGS: Cardiovascular: Moderate to marked cardiomegaly. No significant
pericardial effusion/thickening. Three-vessel coronary
atherosclerosis. Atherosclerotic nonaneurysmal thoracic aorta.
Dilated main pulmonary artery (3.8 cm diameter).

Mediastinum/Nodes: Hypodense partially calcified 1.6 cm posterior
left thyroid nodule, not definitely seen on prior CT. Focus of
coarse calcification in the posterior wall at the esophagogastric
junction, unchanged. Otherwise normal esophagus. No axillary
adenopathy. Right paratracheal adenopathy up to 1.1 cm short axis
diameter (series 3/image 31), unchanged since 11/09/2016 chest CT.
Stable mildly enlarged 1.1 cm subcarinal node (series 3/image 51).
No discrete hilar adenopathy on these noncontrast images.

Lungs/Pleura: No pneumothorax. No pleural effusion. No significant
lobular air trapping or evidence of tracheobronchomalacia on the
expiration sequence. Chronic marked elevation of the right
hemidiaphragm with chronic platelike consolidation and curvilinear
parenchymal bands at right lung base involving the intact right
middle lobe with lesser involvement of the right upper and right
lower lobes, compatible with a combination passive atelectasis and
chronic nonspecific scarring. Mild chronic platelike scarring versus
atelectasis in lingula. Scattered mild interlobular septal
thickening in both lungs. No significant regions of subpleural
reticulation, traction bronchiectasis, architectural distortion,
ground-glass opacity or frank honeycombing. No lung masses or
significant pulmonary nodules.

Upper abdomen: Chronic coarse central liver calcifications. Atrophic
visualized kidneys with confluent innumerable small renal cysts.

Musculoskeletal: No aggressive appearing focal osseous lesions.
Diffuse sclerosis of the thoracic osseous structures, more
pronounced compared to prior chest CT. Moderate thoracic
spondylosis.
IMPRESSION: 1. Moderate to marked cardiomegaly. Scattered mild interlobular
septal thickening, suggesting mild pulmonary edema. No pleural
effusions.
2. Dilated main pulmonary artery, suggesting pulmonary arterial
hypertension.
3. Diffuse sclerosis of the thoracic osseous structures, more
pronounced compared to prior chest CT, probably due to renal
osteodystrophy given atrophic appearing visualized portions of the
kidneys.
4. Chronic marked elevation of the right hemidiaphragm with chronic
platelike consolidation and curvilinear parenchymal bands at the
right lung base, compatible with a combination of passive
atelectasis and chronic nonspecific scarring. No evidence of
interstitial lung disease.
5. Nonspecific mild mediastinal lymphadenopathy, stable since
11/09/2016 chest CT, most compatible with benign reactive
adenopathy.
6. Hypodense 1.6 cm posterior left thyroid nodule, not definitely
seen on prior CT. Recommend thyroid US (ref: [HOSPITAL]. 4884
7. Aortic Atherosclerosis ([id number]).

## 2024-01-24 ENCOUNTER — Other Ambulatory Visit (HOSPITAL_COMMUNITY): Payer: Self-pay

## 2024-01-24 ENCOUNTER — Emergency Department

## 2024-01-24 ENCOUNTER — Encounter: Payer: Self-pay | Admitting: Internal Medicine

## 2024-01-24 ENCOUNTER — Other Ambulatory Visit: Payer: Self-pay

## 2024-01-24 ENCOUNTER — Telehealth (HOSPITAL_COMMUNITY): Payer: Self-pay | Admitting: Pharmacy Technician

## 2024-01-24 ENCOUNTER — Inpatient Hospital Stay
Admission: EM | Admit: 2024-01-24 | Discharge: 2024-01-27 | DRG: 640 | Disposition: A | Discharge status: Home Health | Attending: Internal Medicine | Admitting: Internal Medicine

## 2024-01-24 DIAGNOSIS — N289 Disorder of kidney and ureter, unspecified: Secondary | ICD-10-CM | POA: Diagnosis not present

## 2024-01-24 DIAGNOSIS — Z6839 Body mass index (BMI) 39.0-39.9, adult: Secondary | ICD-10-CM

## 2024-01-24 DIAGNOSIS — Z8701 Personal history of pneumonia (recurrent): Secondary | ICD-10-CM

## 2024-01-24 DIAGNOSIS — C9000 Multiple myeloma not having achieved remission: Secondary | ICD-10-CM | POA: Diagnosis present

## 2024-01-24 DIAGNOSIS — Z9981 Dependence on supplemental oxygen: Secondary | ICD-10-CM

## 2024-01-24 DIAGNOSIS — I10 Essential (primary) hypertension: Secondary | ICD-10-CM | POA: Diagnosis present

## 2024-01-24 DIAGNOSIS — J9601 Acute respiratory failure with hypoxia: Principal | ICD-10-CM

## 2024-01-24 DIAGNOSIS — I169 Hypertensive crisis, unspecified: Secondary | ICD-10-CM | POA: Diagnosis present

## 2024-01-24 DIAGNOSIS — Z7982 Long term (current) use of aspirin: Secondary | ICD-10-CM

## 2024-01-24 DIAGNOSIS — N186 End stage renal disease: Secondary | ICD-10-CM | POA: Diagnosis present

## 2024-01-24 DIAGNOSIS — E875 Hyperkalemia: Secondary | ICD-10-CM | POA: Diagnosis present

## 2024-01-24 DIAGNOSIS — E8770 Fluid overload, unspecified: Principal | ICD-10-CM | POA: Diagnosis present

## 2024-01-24 DIAGNOSIS — I4891 Unspecified atrial fibrillation: Secondary | ICD-10-CM

## 2024-01-24 DIAGNOSIS — Z8616 Personal history of COVID-19: Secondary | ICD-10-CM

## 2024-01-24 DIAGNOSIS — M059 Rheumatoid arthritis with rheumatoid factor, unspecified: Secondary | ICD-10-CM | POA: Diagnosis present

## 2024-01-24 DIAGNOSIS — Z992 Dependence on renal dialysis: Secondary | ICD-10-CM

## 2024-01-24 DIAGNOSIS — Z89422 Acquired absence of other left toe(s): Secondary | ICD-10-CM

## 2024-01-24 DIAGNOSIS — Z8249 Family history of ischemic heart disease and other diseases of the circulatory system: Secondary | ICD-10-CM

## 2024-01-24 DIAGNOSIS — I120 Hypertensive chronic kidney disease with stage 5 chronic kidney disease or end stage renal disease: Secondary | ICD-10-CM | POA: Diagnosis present

## 2024-01-24 DIAGNOSIS — Z8673 Personal history of transient ischemic attack (TIA), and cerebral infarction without residual deficits: Secondary | ICD-10-CM

## 2024-01-24 DIAGNOSIS — N2581 Secondary hyperparathyroidism of renal origin: Principal | ICD-10-CM | POA: Diagnosis present

## 2024-01-24 DIAGNOSIS — I480 Paroxysmal atrial fibrillation: Secondary | ICD-10-CM | POA: Diagnosis present

## 2024-01-24 DIAGNOSIS — J9611 Chronic respiratory failure with hypoxia: Secondary | ICD-10-CM | POA: Diagnosis present

## 2024-01-24 DIAGNOSIS — Z808 Family history of malignant neoplasm of other organs or systems: Secondary | ICD-10-CM

## 2024-01-24 DIAGNOSIS — R262 Difficulty in walking, not elsewhere classified: Secondary | ICD-10-CM | POA: Diagnosis present

## 2024-01-24 DIAGNOSIS — I2510 Atherosclerotic heart disease of native coronary artery without angina pectoris: Secondary | ICD-10-CM | POA: Diagnosis present

## 2024-01-24 DIAGNOSIS — I959 Hypotension, unspecified: Secondary | ICD-10-CM | POA: Diagnosis present

## 2024-01-24 DIAGNOSIS — J449 Chronic obstructive pulmonary disease, unspecified: Secondary | ICD-10-CM | POA: Diagnosis present

## 2024-01-24 DIAGNOSIS — Z90710 Acquired absence of both cervix and uterus: Secondary | ICD-10-CM

## 2024-01-24 DIAGNOSIS — Z515 Encounter for palliative care: Secondary | ICD-10-CM

## 2024-01-24 DIAGNOSIS — D631 Anemia in chronic kidney disease: Secondary | ICD-10-CM | POA: Diagnosis present

## 2024-01-24 DIAGNOSIS — F39 Unspecified mood [affective] disorder: Secondary | ICD-10-CM | POA: Diagnosis present

## 2024-01-24 DIAGNOSIS — Z79899 Other long term (current) drug therapy: Secondary | ICD-10-CM

## 2024-01-24 DIAGNOSIS — R7989 Other specified abnormal findings of blood chemistry: Secondary | ICD-10-CM | POA: Diagnosis present

## 2024-01-24 DIAGNOSIS — G4733 Obstructive sleep apnea (adult) (pediatric): Secondary | ICD-10-CM | POA: Diagnosis present

## 2024-01-24 DIAGNOSIS — R110 Nausea: Secondary | ICD-10-CM | POA: Diagnosis present

## 2024-01-24 DIAGNOSIS — E785 Hyperlipidemia, unspecified: Secondary | ICD-10-CM | POA: Diagnosis present

## 2024-01-24 DIAGNOSIS — Z91013 Allergy to seafood: Secondary | ICD-10-CM

## 2024-01-24 DIAGNOSIS — Z87891 Personal history of nicotine dependence: Secondary | ICD-10-CM

## 2024-01-24 DIAGNOSIS — E66812 Obesity, class 2: Secondary | ICD-10-CM | POA: Diagnosis present

## 2024-01-24 DIAGNOSIS — E8779 Other fluid overload: Secondary | ICD-10-CM | POA: Diagnosis present

## 2024-01-24 DIAGNOSIS — Z90722 Acquired absence of ovaries, bilateral: Secondary | ICD-10-CM

## 2024-01-24 DIAGNOSIS — J9621 Acute and chronic respiratory failure with hypoxia: Secondary | ICD-10-CM | POA: Diagnosis present

## 2024-01-24 LAB — COMPREHENSIVE METABOLIC PANEL WITH GFR
ALT: 15 U/L (ref 0–44)
AST: 26 U/L (ref 15–41)
Albumin: 3.6 g/dL (ref 3.5–5.0)
Alkaline Phosphatase: 59 U/L (ref 38–126)
Anion gap: 19 — ABNORMAL HIGH (ref 5–15)
BUN: 38 mg/dL — ABNORMAL HIGH (ref 8–23)
CO2: 24 mmol/L (ref 22–32)
Calcium: 9 mg/dL (ref 8.9–10.3)
Chloride: 94 mmol/L — ABNORMAL LOW (ref 98–111)
Creatinine, Ser: 7.51 mg/dL — ABNORMAL HIGH (ref 0.44–1.00)
GFR, Estimated: 5 mL/min — ABNORMAL LOW (ref >60)
Glucose, Bld: 110 mg/dL — ABNORMAL HIGH (ref 70–99)
Potassium: 4.9 mmol/L (ref 3.5–5.1)
Sodium: 137 mmol/L (ref 135–145)
Total Bilirubin: 1.2 mg/dL (ref 0.0–1.2)
Total Protein: 8.7 g/dL — ABNORMAL HIGH (ref 6.5–8.1)

## 2024-01-24 LAB — CBC WITH DIFFERENTIAL/PLATELET
Abs Immature Granulocytes: 0.04 10*3/uL (ref 0.00–0.07)
Basophils Absolute: 0 10*3/uL (ref 0.0–0.1)
Basophils Relative: 0 %
Eosinophils Absolute: 0 10*3/uL (ref 0.0–0.5)
Eosinophils Relative: 0 %
HCT: 33 % — ABNORMAL LOW (ref 36.0–46.0)
Hemoglobin: 10.7 g/dL — ABNORMAL LOW (ref 12.0–15.0)
Immature Granulocytes: 1 %
Lymphocytes Relative: 11 %
Lymphs Abs: 0.7 10*3/uL (ref 0.7–4.0)
MCH: 30.6 pg (ref 26.0–34.0)
MCHC: 32.4 g/dL (ref 30.0–36.0)
MCV: 94.3 fL (ref 80.0–100.0)
Monocytes Absolute: 0.9 10*3/uL (ref 0.1–1.0)
Monocytes Relative: 13 %
Neutro Abs: 5 10*3/uL (ref 1.7–7.7)
Neutrophils Relative %: 75 %
Platelets: 143 10*3/uL — ABNORMAL LOW (ref 150–400)
RBC: 3.5 MIL/uL — ABNORMAL LOW (ref 3.87–5.11)
RDW: 15.6 % — ABNORMAL HIGH (ref 11.5–15.5)
WBC: 6.7 10*3/uL (ref 4.0–10.5)
nRBC: 0 % (ref 0.0–0.2)

## 2024-01-24 LAB — TROPONIN I (HIGH SENSITIVITY)
Troponin I (High Sensitivity): 254 ng/L (ref <18)
Troponin I (High Sensitivity): 409 ng/L (ref <18)

## 2024-01-24 LAB — BLOOD GAS, VENOUS
Acid-Base Excess: 2.6 mmol/L — ABNORMAL HIGH (ref 0.0–2.0)
Bicarbonate: 27.2 mmol/L (ref 20.0–28.0)
O2 Saturation: 79 %
Patient temperature: 37
pCO2, Ven: 41 mmHg — ABNORMAL LOW (ref 44–60)
pH, Ven: 7.43 (ref 7.25–7.43)
pO2, Ven: 48 mmHg — ABNORMAL HIGH (ref 32–45)

## 2024-01-24 LAB — HEPATITIS B SURFACE ANTIGEN: Hepatitis B Surface Ag: NONREACTIVE

## 2024-01-24 MED ORDER — APIXABAN 2.5 MG PO TABS
2.5000 mg | ORAL_TABLET | Freq: Two times a day (BID) | ORAL | Status: DC
  Administered 2024-01-25 – 2024-01-27 (×6): 2.5 mg via ORAL
  Filled 2024-01-24 (×7): qty 1

## 2024-01-24 MED ORDER — APIXABAN 5 MG PO TABS
5.0000 mg | ORAL_TABLET | Freq: Two times a day (BID) | ORAL | Status: DC
  Filled 2024-01-24: qty 1

## 2024-01-24 MED ORDER — HYDROXYCHLOROQUINE SULFATE 200 MG PO TABS
200.0000 mg | ORAL_TABLET | Freq: Every day | ORAL | Status: DC
  Administered 2024-01-25 – 2024-01-27 (×3): 200 mg via ORAL
  Filled 2024-01-24 (×5): qty 1

## 2024-01-24 MED ORDER — ASPIRIN 325 MG PO TABS
325.0000 mg | ORAL_TABLET | Freq: Once | ORAL | Status: AC
  Administered 2024-01-24: 325 mg via ORAL
  Filled 2024-01-24: qty 1

## 2024-01-24 MED ORDER — CHLORHEXIDINE GLUCONATE CLOTH 2 % EX PADS
6.0000 | MEDICATED_PAD | Freq: Every day | CUTANEOUS | Status: DC
  Administered 2024-01-26 – 2024-01-27 (×2): 6 via TOPICAL
  Filled 2024-01-24: qty 6

## 2024-01-24 NOTE — ED Notes (Signed)
 Fall precautions taken. Fall bracelet and non-slip socks applied to pt. Bed alarm on.

## 2024-01-24 NOTE — ED Notes (Signed)
 MD made aware critical troponin 254 at this time.

## 2024-01-24 NOTE — ED Triage Notes (Signed)
 Pt to ED via [facility] from home for respiratory distress. Pt is dialysis pt that gets dialysis m/w/f. Pt has not had dialysis today but has not missed any other treatments. Pt has hx of heart failure. Pt began having SOB on Monday after dialysis. Pt was initally 75% on her home CPAP. On EMS CPAP, EMS reported pt 95%.  Pt alert and oriented x4.   EMS vitals: BP 212/107 HR 110 EMS gave 1 inch of nitroglycerin  paste

## 2024-01-24 NOTE — ED Notes (Signed)
 Pt brief changed, urine and stool noted, pt repositioned  and call bell in hand

## 2024-01-24 NOTE — ED Notes (Signed)
 nitroglycerin  paste removed at this time per MD Ray.

## 2024-01-24 NOTE — Procedures (Signed)
 HD Note:  Some information was entered later than the data was gathered due to patient care needs. The stated time with the data is accurate.  Received patient in bed to unit.   Alert and oriented.   Informed consent signed and in chart.   Access used: right forearm fistula Access issues: None  Patient dislodged her peripheral IV in her left forearm while moving on the stretcher.  Machine dialyzer clotted and machine had to be reset at one point during treatment.  See flowsheet for detail.  Patient experienced a few issues of hypotension.  UF was paused at times.  Patient was slightly dizzy with the hypotension.  Goal was adjusted as needed.  TX duration: 3.5 hours  Alert, without acute distress.  Total UF removed: 2200 ml  Hand-off given to patient's nurse.   Transported back to the room   Coraline Talwar L. Alva Jewels, RN [facility].

## 2024-01-24 NOTE — H&P (Signed)
 History and Physical    Patient: Cassandra Joseph XNA:[id number] DOB: [birth year] DOA: 01/24/2024 DOS: the patient was seen and examined on 01/24/2024 PCP: Antonio Baumgarten, MD  Patient coming from: Home - lives with daughter [name]); NOK: Daughter, [name], [phone]   Chief Complaint: SOB  HPI: Cassandra Joseph is a 78 y.o. female with medical history significant of Marni Sins tumor s/p TAH/BSO (2010), ESRD on HD, HTN, HLD, CVA, multiple myeloma, and RA who presented on 6/18 with respiratory distress. She did not feel well on Monday post-HD.  She felt worse that night, weak and tired.  She became SOB on Tuesday AM and this has worsened since.  +productive cough with yellow/brown/clear phlegm.  The daughter who lives with her is also sick with a cold.  No fever.  No h/o afib.  No longer smokes.  Wears 3L home O2 during the day and CPAP at night.  She is non-ambulatory at baseline, uses a Hoyer lift to move from bed to chair and back.    ER Course:   On MWF HD, got SOB after Monday's session and progressively worse since.  Missed HD today.  81% on home CPAP, changed to BIPAP here, improved.  SBP in 200s.  ?flash pulmonary edema from HTN crisis.  Nephrology will coordinate dialysis.     Review of Systems: As mentioned in the history of present illness. All other systems reviewed and are negative. Past Medical History:  Diagnosis Date   Marni Sins tumor    a. s/p TAH/BSO 08/2008   CHF (congestive heart failure) (HCC)    Effusion of left hip 09/28/2020   ESRD (end stage renal disease) (HCC)    a. on HD x 11 years; b. felt to be 2/2 HTN; c. HD TTS   History of anemia due to chronic kidney disease    History of nuclear stress test    a. 08/2015: probably normal myocardial perfusion study, no evidence for significant ischemia or scar was noted, during stress, global systolic function normal, EF 54%, RV borderline dilatation, coronary artery calcifications and marked mitral annular  calcifications were noted, liver calcifications were noted on attenuation CT scan likely represent sequelae of previous granulomatous disease   HLD (hyperlipidemia)    Hypertension    Ischemic stroke (HCC)    a. 03/2014   Left leg pain    Metabolic syndrome    Morbid obesity (HCC)    Multiple myeloma (HCC)    a. 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 2014   Viral pneumonia 08/08/2020   Past Surgical History:  Procedure Laterality Date   ABDOMINAL HYSTERECTOMY     APPENDECTOMY     eyelid surgery     HERNIA REPAIR     right knee surgery     Social History:  reports that she has quit smoking. Her smoking use included cigarettes. She has never used smokeless tobacco. She reports that she does not drink alcohol  and does not use drugs.  Allergies  Allergen Reactions   Shellfish Allergy Shortness Of Breath   Shellfish Allergy Other (See Comments)    Unknown reaction    Family History  Problem Relation Age of Onset   Seizures Mother    Hypertension Mother  Throat cancer Father    Breast cancer Neg Hx     Prior to Admission medications   Medication Sig Start Date End Date Taking? Authorizing Provider  ALPRAZolam  (XANAX ) 0.25 MG tablet Take 0.25 mg by mouth every 8 (eight) hours as needed. 12/14/23  Yes [provider]  amLODipine  (NORVASC ) 5 MG tablet Take 5 mg by mouth daily. 12/14/23  Yes [provider]  atorvastatin  (LIPITOR ) 80 MG tablet Take 1 tablet (80 mg total) by mouth daily. 06/29/19  Yes Kraig Peru, MD  b complex-vitamin c-folic acid  (NEPHRO-VITE) 0.8 MG TABS tablet Take 1 tablet by mouth daily. 09/27/16  Yes [provider]  citalopram (CELEXA) 10 MG tablet Take 10 mg by mouth daily. 11/13/23  Yes [provider]  midodrine   (PROAMATINE ) 10 MG tablet Take 1 tablet (10 mg total) by mouth 3 (three) times daily with meals. 06/28/19  Yes Kraig Peru, MD  amLODipine  (NORVASC ) 10 MG tablet Take 5 mg by mouth daily. 01/11/22   [provider]  ASPIRIN  Joseph DOSE 81 MG EC tablet Take 81 mg by mouth daily. 12/26/20   [provider]  cinacalcet  (SENSIPAR ) 60 MG tablet Take 60 mg by mouth 2 (two) times daily.    [provider]  docusate sodium  (COLACE) 100 MG capsule Take 100 mg by mouth 2 (two) times daily. 03/08/21   [provider]  epoetin  alfa (EPOGEN ) 4000 UNIT/ML injection Inject 1 mL (4,000 Units total) into the vein Every Tuesday,Thursday,and Saturday with dialysis. Patient taking differently: Inject 4,000 Units into the vein every Monday, Wednesday, and Friday with hemodialysis. 10/05/20   Abbe Abate, MD  fluticasone  (FLONASE ) 50 MCG/ACT nasal spray Place 1-2 sprays into both nostrils daily at 12 noon. 12/26/20   [provider]  gabapentin  (NEURONTIN ) 300 MG capsule Take 300 mg by mouth 3 (three) times daily. 07/27/20   [provider]  Hydrocortisone , Perianal, 1 % CREA Apply topically. 11/24/20   [provider]  hydroxychloroquine  (PLAQUENIL ) 200 MG tablet Take 200 mg by mouth daily. 06/30/20   [provider]  Nutritional Supplements (FEEDING SUPPLEMENT, NEPRO CARB STEADY,) LIQD Take 237 mLs by mouth 3 (three) times daily between meals. 05/01/21   Montey Apa, DO  polyethylene glycol (MIRALAX  / GLYCOLAX ) 17 g packet Take 17 g by mouth 2 (two) times daily. 10/05/20   Abbe Abate, MD  senna (SENOKOT) 8.6 MG tablet Take 2 tablets by mouth at bedtime. 11/27/20   [provider]  sevelamer  carbonate (RENVELA ) 800 MG tablet Take 1,600-2,400 mg by mouth 3 (three) times daily. Take 3 tabs with meals and 2 tabs with snacks 03/16/21   [provider]    Physical Exam: Vitals:   01/24/24 1714 01/24/24 1730 01/24/24 1745  01/24/24 1800  BP: (!) 121/48 126/62 137/74 (!) 97/42  Pulse: 92 96 92 88  Resp: 19 20 17 19   Temp:      TempSrc:      SpO2: 97% 97% 97% 98%  Weight:      Height:       General:  Appears calm and comfortable and is in NAD, on [place] O2 Eyes:   normal lids, iris ENT:  grossly normal hearing, lips & tongue, mmm Cardiovascular:  Irregularly irregular, generally rate controlled. No LE edema.  Respiratory:   CTA bilaterally with no wheezes/rales/rhonchi.  Normal respiratory effort. Abdomen:  soft, NT, ND, NABS Back:   normal alignment, no CVAT Skin:  no  rash or induration seen on limited exam Musculoskeletal:  grossly normal tone BUE/BLE, good ROM, no bony abnormality Psychiatric:  blunted mood and affect, speech fluent and appropriate, AOx3 Neurologic:  CN 2-12 grossly intact, moves all extremities in coordinated fashion   Radiological Exams on Admission: Independently reviewed - see discussion in A/P where applicable  DG Chest Port 1 View Result Date: 01/24/2024 CLINICAL DATA:  Shortness of breath. EXAM: PORTABLE CHEST 1 VIEW COMPARISON:  02/13/2022. FINDINGS: Patient is rotated to the right with associated shift of the cardiomediastinal silhouette. Cardiomegaly, similar to the prior exam. Aortic atherosclerosis. Streaky bibasilar interstitial opacities could reflect atelectasis or edema. No pneumothorax. No acute osseous abnormality. IMPRESSION: Cardiomegaly with streaky bibasilar interstitial opacities, which could reflect atelectasis or edema. Electronically Signed   By: Mannie Seek M.D.   On: 01/24/2024 12:37    EKG: Independently reviewed.  Afib with rate 112; prolonged QTc 504; nonspecific ST changes with no evidence of acute ischemia   Labs on Admission: I have personally reviewed the available labs and imaging studies at the time of the admission.  Pertinent labs:    VBG: 7.43/41/27.2 Glucose 110 BUN 38/Creatinine 7.51/GFR 5 HS troponin 254 WBC 6.7 Hgb 10.7 Platelets  143   Assessment and Plan: Principal Problem:   Hypervolemia associated with renal insufficiency Active Problems:   Multiple myeloma (HCC)   Essential hypertension   ESRD on hemodialysis (HCC)   PAF (paroxysmal atrial fibrillation) (HCC)   OSA on CPAP   HLD (hyperlipidemia)   Rheumatoid arthritis with positive rheumatoid factor (HCC)   Chronic respiratory failure with hypoxia (HCC)    Hypervolemia from renal insufficiency Patient on chronic MWF HD Has not missed HD (other than today) but she is concerned that they recorded her weight wrong on Monday and/or did not take enough fluid off Nephrology prn order set utilized Nephrology consulted for urgent HD Initially required BIPAP but she was weaned to [place] O2 by the time I saw her  ESRD on HD Patient on chronic MWF HD Nephrology prn order set utilized Planned for urgent HD now Continue Sensipar , Renvela , Epogen , rena-vite  Atrial fibrillation  Patient presenting with reported new-onset afib but she has had this in the past according to review of records Will admit to telemetry She is currently rate controlled without medication Will request Echocardiogram for further evaluation  CHA2DS2-VASc Score is >2 and so patient would benefit from oral anticoagulation Initiate Eliquis  Joseph-dose given prior history of GI bleed in 2023  COPD On chronic home O2, 3L  HTN Discontinue amlodipine  - she may need an alternative nodal agent for BP/rate control Hold midodrine  for now and resume if needed Will also add prn hydralazine   HLD Continue Lipitor   Mood d/o Continue alprazolam , citalopram  Multiple myeloma Appears to have been lost to f/u since 2022 - will notify Dr. Valentine Gasmen Calcium  is normal at this time No recent imaging Recommend outpatient f/u  RA Continue hydroxychloroquine   Ambulatory dysfunction She is non-ambulatory and does not transfer at baseline Uses a Hoyer lift at home [place] NP visits at home by  PCP  OSA Continue nocturnal CPAP  Class 2 obesity Body mass index is 39.68 kg/m.Aaron Aas  Weight loss should be encouraged Outpatient PCP/bariatric medicine f/u encouraged Significantly Joseph or high BMI is associated with higher medical risk including morbidity and mortality      Advance Care Planning:   Code Status: Full Code  - Code status was discussed with the patient and/or family at the time  of admission.  The patient would want to receive full resuscitative measures at this time.   She was previously followed by palliative care at home and likely needs ongoing GOC conversations.  Consults: Nephrology; [facility] team  DVT Prophylaxis: Heparin   Family Communication: Daughter Wallene Gum) was present throughout  Severity of Illness: The appropriate patient status for this patient is OBSERVATION. Observation status is judged to be reasonable and necessary in order to provide the required intensity of service to ensure the patient's safety. The patient's presenting symptoms, physical exam findings, and initial radiographic and laboratory data in the context of their medical condition is felt to place them at decreased risk for further clinical deterioration. Furthermore, it is anticipated that the patient will be medically stable for discharge from the hospital within 2 midnights of admission.   Author: Lorita Rosa, MD 01/24/2024 6:14 PM  For on call review [email or web address].

## 2024-01-24 NOTE — Consult Note (Addendum)
 Pharmacy Consult Note - Anticoagulation  Pharmacy Consult for Eliquis  Indication: atrial fibrillation  PATIENT MEASUREMENTS: Height: 5' 6 (167.6 cm) Weight: 111.5 kg (245 lb 13 oz) IBW/kg (Calculated) : 59.3 HEPARIN  DW (KG): 85.4  VITAL SIGNS: Temp: 98.6 F (37 C) (06/18 1437) Temp Source: Oral (06/18 1437) BP: 146/79 (06/18 1456) Pulse Rate: 92 (06/18 1456)  Recent Labs    01/24/24 1007 01/24/24 1237  HGB 10.7*  --   HCT 33.0*  --   PLT 143*  --   CREATININE 7.51*  --   TROPONINIHS 254* 409*    Estimated Creatinine Clearance: 7.9 mL/min (A) (by C-G formula based on SCr of 7.51 mg/dL (H)).  PAST MEDICAL HISTORY: Past Medical History:  Diagnosis Date   Cassandra Joseph tumor    a. s/p TAH/BSO 08/2008   CHF (congestive heart failure) (HCC)    Effusion of left hip 09/28/2020   ESRD (end stage renal disease) (HCC)    a. on HD x 11 years; b. felt to be 2/2 HTN; c. HD TTS   History of anemia due to chronic kidney disease    History of nuclear stress test    a. 08/2015: probably normal myocardial perfusion study, no evidence for significant ischemia or scar was noted, during stress, global systolic function normal, EF 54%, RV borderline dilatation, coronary artery calcifications and marked mitral annular calcifications were noted, liver calcifications were noted on attenuation CT scan likely represent sequelae of previous granulomatous disease   HLD (hyperlipidemia)    Hypertension    Ischemic stroke (HCC)    a. 03/2014   Left leg pain    Metabolic syndrome    Morbid obesity (HCC)    Multiple myeloma (HCC)    a. 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 2014   Viral pneumonia 08/08/2020    ASSESSMENT: 78 y.o. female with PMH including ESRD-HD, HLD, stroke, multiple myeloma,  paroxysmal Afib is presenting with atrial fibrillation. HR currently 90-100, and O2 requirements decreasing from BiPAP to Cassandra Joseph. CHA2DS2VASc is at least 7 (CHF, HTN, age+2, stroke+2, female sex) in addition to multiple myeloma, however her HAS-BLED is high as well at at least 4. Patient had previously been on Eliquis  2.5mg  twice daily for Afib which was d/c'ed in 2023 due to GI bleed with instruction to follow up resuming with PCP; outcome of that discussion with PCP is unknown. Discussed with consulting provider and ultimately electing to proceed with lower-dose Eliquis .  Pertinent medications: Aspirin  81mg   Goal(s) of therapy: Heparin  level 0.3 - 0.7 units/mL Monitor platelets by anticoagulation protocol: Yes   Baseline anticoagulation labs: Recent Labs    01/24/24 1007  HGB 10.7*  PLT 143*      PLAN: Initiate Eliquis  2.5mg  PO twice daily Monitor CBC at least every 72 hours  Will M. Alva Jewels, PharmD Clinical Pharmacist 01/24/2024 3:24 PM

## 2024-01-24 NOTE — ED Provider Notes (Signed)
 [facility] Provider Note    Event Date/Time   First MD Initiated Contact with Patient 01/24/24 1012     (approximate)   History   Respiratory Distress   HPI  Cassandra Joseph is a 78 year old female with history of atrial fibrillation, CHF, ESRD on HD, HTN, OSA presenting to the emergency department for evaluation of shortness of breath.  Patient reports she completed her dialysis session on Monday.  Shortly after she had onset of shortness of breath, worse today.  Initially satting 75% on her home CPAP, transition to EMS CPAP with improvement to the mid 90s.  Hypertensive with initial BP of 212/107, HR 110, given 1 inch of Nitropaste.  Patient reports that she has had something similar in the past when she was fluid overloaded, but says it has been a while since that is occurred.  No recent encounters in our system, do note that she was having palliative care home visits in 2024.      Physical Exam   Triage Vital Signs: ED Triage Vitals  Encounter Vitals Group     BP      Girls Systolic BP Percentile      Girls Diastolic BP Percentile      Boys Systolic BP Percentile      Boys Diastolic BP Percentile      Pulse      Resp      Temp      Temp src      SpO2      Weight      Height      Head Circumference      Peak Flow      Pain Score      Pain Loc      Pain Education      Exclude from Growth Chart     Most recent vital signs: Vitals:   01/24/24 1100 01/24/24 1145  BP: 133/67 113/67  Pulse: 95 (!) 104  Resp: 16 18  SpO2: 100% 100%     General: Awake, interactive  CV:  Tachycardic with irregularly irregular rhythm Resp:  Tachypneic, bilateral crackles noted, coarse lung sounds Abd:  Nondistended, soft, nontender Neuro:  Symmetric facial movement, fluid speech   ED Results / Procedures / Treatments   Labs (all labs ordered are listed, but only abnormal results are displayed) Labs Reviewed  BLOOD GAS, VENOUS - Abnormal;  Notable for the following components:      Result Value   pCO2, Ven 41 (*)    pO2, Ven 48 (*)    Acid-Base Excess 2.6 (*)    All other components within normal limits  CBC WITH DIFFERENTIAL/PLATELET - Abnormal; Notable for the following components:   RBC 3.50 (*)    Hemoglobin 10.7 (*)    HCT 33.0 (*)    RDW 15.6 (*)    Platelets 143 (*)    All other components within normal limits  COMPREHENSIVE METABOLIC PANEL WITH GFR - Abnormal; Notable for the following components:   Chloride 94 (*)    Glucose, Bld 110 (*)    BUN 38 (*)    Creatinine, Ser 7.51 (*)    Total Protein 8.7 (*)    GFR, Estimated 5 (*)    Anion gap 19 (*)    All other components within normal limits  TROPONIN I (HIGH SENSITIVITY) - Abnormal; Notable for the following components:   Troponin I (High Sensitivity) 254 (*)    All other components within normal  limits  TROPONIN I (HIGH SENSITIVITY)     EKG EKG independently reviewed and interpreted by myself demonstrates:  EKG demonstrates A-fib at a rate of 112, QRS 107, QTc 504, no acute ST changes  RADIOLOGY Imaging independently reviewed and interpreted by myself demonstrates:  CXR with streaky opacities, per radiology reflective of atelectasis versus edema, suspect likely edema based on clinical presentation   Formal Radiology Read:  DG Chest Port 1 View Result Date: 01/24/2024 CLINICAL DATA:  Shortness of breath. EXAM: PORTABLE CHEST 1 VIEW COMPARISON:  02/13/2022. FINDINGS: Patient is rotated to the right with associated shift of the cardiomediastinal silhouette. Cardiomegaly, similar to the prior exam. Aortic atherosclerosis. Streaky bibasilar interstitial opacities could reflect atelectasis or edema. No pneumothorax. No acute osseous abnormality. IMPRESSION: Cardiomegaly with streaky bibasilar interstitial opacities, which could reflect atelectasis or edema. Electronically Signed   By: Mannie Seek M.D.   On: 01/24/2024 12:37    PROCEDURES:  Critical  Care performed: Yes, see critical care procedure note(s)  CRITICAL CARE Performed by: Claria Crofts   Total critical care time: 31 minutes  Critical care time was exclusive of separately billable procedures and treating other patients.  Critical care was necessary to treat or prevent imminent or life-threatening deterioration.  Critical care was time spent personally by me on the following activities: development of treatment plan with patient and/or surrogate as well as nursing, discussions with consultants, evaluation of patient's response to treatment, examination of patient, obtaining history from patient or surrogate, ordering and performing treatments and interventions, ordering and review of laboratory studies, ordering and review of radiographic studies, pulse oximetry and re-evaluation of patient's condition.   Procedures   MEDICATIONS ORDERED IN ED: Medications  aspirin  tablet 325 mg (has no administration in time range)     IMPRESSION / MDM / ASSESSMENT AND PLAN / ED COURSE  I reviewed the triage vital signs and the nursing notes.  Differential diagnosis includes, but is not limited to, volume overload due to ESRD, CHF exacerbation, flash pulmonary edema due to hypertensive emergency, ACS, pneumonia  Patient's presentation is most consistent with acute presentation with potential threat to life or bodily function.  78 year old female presenting to the emergency department for evaluation of shortness of breath.  Hypoxic, tachypneic, tachycardic for EMS.  Transition to BiPAP on presentation.  VBG without hypercarbia.  Arrives with Nitropaste from EMS, but initial blood pressure here significantly lower than initial systolic, we will go ahead and remove Nitropaste here.  Patient does not make urine.  Will obtain labs, EKG, x-Mauri Temkin to further evaluate.  Clinical Course as of 01/24/24 1259  Wed Jan 24, 2024  1019 BP: 138/76 Rapid drop in blood pressure, will remove Nitropaste. [NR]   1246 Labs with normal white blood cell count, stable anemia, elevated BUN and creatinine consistent with known history of ESRD, normal potassium.  Elevated troponin at 234, likely related to demand and poor renal clearance.  X-Traeton Bordas read as edema versus atelectasis, I am concerned about edema given clinical presentation.  She does not make urine so we will hold off on diuresis.  Case reviewed with Dr. Rhesa Celeste who will coordinate dialysis.  Will reach out to hospitalist team to discuss admission. [NR]  1257 Case discussed with Dr. Murrel Arnt who will evaluate the patient for anticipated admission.  [NR]    Clinical Course User Index [NR] Claria Crofts, MD     FINAL CLINICAL IMPRESSION(S) / ED DIAGNOSES   Final diagnoses:  Acute hypoxic respiratory failure (HCC)  Hypervolemia,  unspecified hypervolemia type  Elevated troponin     Rx / DC Orders   ED Discharge Orders     None        Note:  This document was prepared using Dragon voice recognition software and may include unintentional dictation errors.   Claria Crofts, MD 01/24/24 1259

## 2024-01-24 NOTE — Telephone Encounter (Signed)
 Patient Product/process development scientist completed.    The patient is insured through [employer]. Patient has Medicare and is not eligible for a copay card, but may be able to apply for patient assistance or Medicare RX Payment Plan (Patient Must reach out to their plan, if eligible for payment plan), if available.    Ran test claim for Eliquis  5 mg and the current 30 day co-pay is $12.15.   This test claim was processed through [facility]- copay amounts may vary at other pharmacies due to pharmacy/plan contracts, or as the patient moves through the different stages of their insurance plan.     Morgan Arab, CPHT Pharmacy Technician III Certified Patient Advocate [facility] Patient Advocate Team Direct Number: [phone]  Fax: [phone]

## 2024-01-25 ENCOUNTER — Observation Stay
Admit: 2024-01-25 | Discharge: 2024-01-25 | Disposition: A | Discharge status: Home / Self Care | Attending: Internal Medicine

## 2024-01-25 ENCOUNTER — Other Ambulatory Visit

## 2024-01-25 DIAGNOSIS — E8770 Fluid overload, unspecified: Secondary | ICD-10-CM | POA: Diagnosis present

## 2024-01-25 DIAGNOSIS — I169 Hypertensive crisis, unspecified: Secondary | ICD-10-CM | POA: Diagnosis present

## 2024-01-25 DIAGNOSIS — N2581 Secondary hyperparathyroidism of renal origin: Secondary | ICD-10-CM | POA: Diagnosis present

## 2024-01-25 DIAGNOSIS — N186 End stage renal disease: Secondary | ICD-10-CM | POA: Diagnosis present

## 2024-01-25 DIAGNOSIS — Z515 Encounter for palliative care: Secondary | ICD-10-CM | POA: Diagnosis not present

## 2024-01-25 DIAGNOSIS — E785 Hyperlipidemia, unspecified: Secondary | ICD-10-CM | POA: Diagnosis present

## 2024-01-25 DIAGNOSIS — J449 Chronic obstructive pulmonary disease, unspecified: Secondary | ICD-10-CM | POA: Diagnosis present

## 2024-01-25 DIAGNOSIS — I120 Hypertensive chronic kidney disease with stage 5 chronic kidney disease or end stage renal disease: Secondary | ICD-10-CM | POA: Diagnosis present

## 2024-01-25 DIAGNOSIS — J9621 Acute and chronic respiratory failure with hypoxia: Secondary | ICD-10-CM | POA: Diagnosis present

## 2024-01-25 DIAGNOSIS — Z992 Dependence on renal dialysis: Secondary | ICD-10-CM | POA: Diagnosis not present

## 2024-01-25 DIAGNOSIS — E875 Hyperkalemia: Secondary | ICD-10-CM | POA: Diagnosis present

## 2024-01-25 DIAGNOSIS — Z87891 Personal history of nicotine dependence: Secondary | ICD-10-CM | POA: Diagnosis not present

## 2024-01-25 DIAGNOSIS — G4733 Obstructive sleep apnea (adult) (pediatric): Secondary | ICD-10-CM | POA: Diagnosis present

## 2024-01-25 DIAGNOSIS — R262 Difficulty in walking, not elsewhere classified: Secondary | ICD-10-CM | POA: Diagnosis present

## 2024-01-25 DIAGNOSIS — F39 Unspecified mood [affective] disorder: Secondary | ICD-10-CM | POA: Diagnosis present

## 2024-01-25 DIAGNOSIS — Z9981 Dependence on supplemental oxygen: Secondary | ICD-10-CM | POA: Diagnosis not present

## 2024-01-25 DIAGNOSIS — I4891 Unspecified atrial fibrillation: Secondary | ICD-10-CM

## 2024-01-25 DIAGNOSIS — D631 Anemia in chronic kidney disease: Secondary | ICD-10-CM | POA: Diagnosis present

## 2024-01-25 DIAGNOSIS — N289 Disorder of kidney and ureter, unspecified: Secondary | ICD-10-CM | POA: Diagnosis not present

## 2024-01-25 DIAGNOSIS — E66812 Obesity, class 2: Secondary | ICD-10-CM | POA: Diagnosis present

## 2024-01-25 DIAGNOSIS — M059 Rheumatoid arthritis with rheumatoid factor, unspecified: Secondary | ICD-10-CM | POA: Diagnosis present

## 2024-01-25 DIAGNOSIS — C9000 Multiple myeloma not having achieved remission: Secondary | ICD-10-CM | POA: Diagnosis present

## 2024-01-25 DIAGNOSIS — Z8616 Personal history of COVID-19: Secondary | ICD-10-CM | POA: Diagnosis not present

## 2024-01-25 DIAGNOSIS — I480 Paroxysmal atrial fibrillation: Secondary | ICD-10-CM | POA: Diagnosis present

## 2024-01-25 DIAGNOSIS — E8779 Other fluid overload: Secondary | ICD-10-CM | POA: Diagnosis present

## 2024-01-25 LAB — BASIC METABOLIC PANEL WITH GFR
Anion gap: 16 — ABNORMAL HIGH (ref 5–15)
BUN: 26 mg/dL — ABNORMAL HIGH (ref 8–23)
CO2: 24 mmol/L (ref 22–32)
Calcium: 9.5 mg/dL (ref 8.9–10.3)
Chloride: 92 mmol/L — ABNORMAL LOW (ref 98–111)
Creatinine, Ser: 5.27 mg/dL — ABNORMAL HIGH (ref 0.44–1.00)
GFR, Estimated: 8 mL/min — ABNORMAL LOW (ref >60)
Glucose, Bld: 93 mg/dL (ref 70–99)
Potassium: 4.2 mmol/L (ref 3.5–5.1)
Sodium: 132 mmol/L — ABNORMAL LOW (ref 135–145)

## 2024-01-25 LAB — ECHOCARDIOGRAM COMPLETE
AR max vel: 2.57 cm2
AV Peak grad: 10.4 mmHg
Ao pk vel: 1.61 m/s
Area-P 1/2: 3.17 cm2
Height: 66 in
MV VTI: 1.72 cm2
S' Lateral: 2.8 cm
Weight: 3933.01 [oz_av]

## 2024-01-25 LAB — TROPONIN I (HIGH SENSITIVITY)
Troponin I (High Sensitivity): 808 ng/L (ref <18)
Troponin I (High Sensitivity): 918 ng/L (ref <18)
Troponin I (High Sensitivity): 909 ng/L (ref <18)
Troponin I (High Sensitivity): 837 ng/L (ref <18)

## 2024-01-25 LAB — CBC
HCT: 32.9 % — ABNORMAL LOW (ref 36.0–46.0)
Hemoglobin: 10.8 g/dL — ABNORMAL LOW (ref 12.0–15.0)
MCH: 31.2 pg (ref 26.0–34.0)
MCHC: 32.8 g/dL (ref 30.0–36.0)
MCV: 95.1 fL (ref 80.0–100.0)
Platelets: 130 10*3/uL — ABNORMAL LOW (ref 150–400)
RBC: 3.46 MIL/uL — ABNORMAL LOW (ref 3.87–5.11)
RDW: 15.3 % (ref 11.5–15.5)
WBC: 4.5 10*3/uL (ref 4.0–10.5)
nRBC: 0 % (ref 0.0–0.2)

## 2024-01-25 LAB — HEPATITIS B SURFACE ANTIBODY, QUANTITATIVE: Hep B S AB Quant (Post): 24.7 m[IU]/mL

## 2024-01-25 MED ORDER — ONDANSETRON HCL 4 MG PO TABS
4.0000 mg | ORAL_TABLET | Freq: Four times a day (QID) | ORAL | Status: AC | PRN
Start: 2024-01-25 — End: ?

## 2024-01-25 MED ORDER — CAMPHOR-MENTHOL 0.5-0.5 % EX LOTN: 1.0000 | TOPICAL_LOTION | Freq: Three times a day (TID) | CUTANEOUS | Status: DC | PRN

## 2024-01-25 MED ORDER — GABAPENTIN 300 MG PO CAPS
300.0000 mg | ORAL_CAPSULE | Freq: Three times a day (TID) | ORAL | Status: DC
  Administered 2024-01-25 – 2024-01-27 (×8): 300 mg via ORAL
  Filled 2024-01-25 (×8): qty 1

## 2024-01-25 MED ORDER — HYDROXYZINE HCL 50 MG PO TABS: 25.0000 mg | ORAL_TABLET | Freq: Three times a day (TID) | ORAL | Status: DC | PRN

## 2024-01-25 MED ORDER — ALPRAZOLAM 0.25 MG PO TABS: 0.2500 mg | ORAL_TABLET | Freq: Three times a day (TID) | ORAL | Status: DC | PRN

## 2024-01-25 MED ORDER — ALBUTEROL SULFATE (2.5 MG/3ML) 0.083% IN NEBU: 2.5000 mg | INHALATION_SOLUTION | RESPIRATORY_TRACT | Status: DC | PRN

## 2024-01-25 MED ORDER — NEPRO/CARBSTEADY PO LIQD: 237.0000 mL | Freq: Three times a day (TID) | ORAL | Status: DC | PRN

## 2024-01-25 MED ORDER — RENA-VITE PO TABS
1.0000 | ORAL_TABLET | Freq: Every day | ORAL | Status: DC
  Administered 2024-01-25 – 2024-01-26 (×2): 1 via ORAL
  Filled 2024-01-25 (×4): qty 1

## 2024-01-25 MED ORDER — ONDANSETRON HCL 4 MG/2ML IJ SOLN: 4.0000 mg | Freq: Four times a day (QID) | INTRAMUSCULAR | Status: DC | PRN

## 2024-01-25 MED ORDER — EPOETIN ALFA 4000 UNIT/ML IJ SOLN
4000.0000 [IU] | INTRAMUSCULAR | Status: DC
  Administered 2024-01-27: 4000 [IU] via INTRAVENOUS
  Filled 2024-01-25 (×2): qty 1

## 2024-01-25 MED ORDER — SORBITOL 70 % SOLN: 30.0000 mL | Status: DC | PRN

## 2024-01-25 MED ORDER — CINACALCET HCL 30 MG PO TABS
60.0000 mg | ORAL_TABLET | Freq: Two times a day (BID) | ORAL | Status: DC
  Administered 2024-01-25 – 2024-01-27 (×5): 60 mg via ORAL
  Filled 2024-01-25 (×6): qty 2

## 2024-01-25 MED ORDER — ACETAMINOPHEN 650 MG RE SUPP: 650.0000 mg | Freq: Four times a day (QID) | RECTAL | Status: DC | PRN

## 2024-01-25 MED ORDER — SENNA 8.6 MG PO TABS
2.0000 | ORAL_TABLET | Freq: Every day | ORAL | Status: DC
  Administered 2024-01-26: 17.2 mg via ORAL
  Filled 2024-01-25 (×2): qty 2

## 2024-01-25 MED ORDER — CITALOPRAM HYDROBROMIDE 20 MG PO TABS
10.0000 mg | ORAL_TABLET | Freq: Every day | ORAL | Status: DC
  Administered 2024-01-25 – 2024-01-27 (×3): 10 mg via ORAL
  Filled 2024-01-25 (×3): qty 1

## 2024-01-25 MED ORDER — POLYETHYLENE GLYCOL 3350 17 G PO PACK
17.0000 g | PACK | Freq: Two times a day (BID) | ORAL | Status: DC
  Administered 2024-01-25 – 2024-01-27 (×3): 17 g via ORAL
  Filled 2024-01-25 (×5): qty 1

## 2024-01-25 MED ORDER — SODIUM CHLORIDE 0.9% FLUSH
3.0000 mL | Freq: Two times a day (BID) | INTRAVENOUS | Status: AC
Start: 2024-01-25 — End: ?
  Administered 2024-01-25 – 2024-01-27 (×6): 3 mL via INTRAVENOUS

## 2024-01-25 MED ORDER — NEPRO/CARBSTEADY PO LIQD
237.0000 mL | Freq: Three times a day (TID) | ORAL | Status: DC
  Administered 2024-01-25 – 2024-01-27 (×5): 237 mL via ORAL

## 2024-01-25 MED ORDER — CALCIUM CARBONATE ANTACID 1250 MG/5ML PO SUSP: 500.0000 mg | Freq: Four times a day (QID) | ORAL | Status: DC | PRN

## 2024-01-25 MED ORDER — ATORVASTATIN CALCIUM 20 MG PO TABS
80.0000 mg | ORAL_TABLET | Freq: Every day | ORAL | Status: DC
  Administered 2024-01-25 – 2024-01-27 (×3): 80 mg via ORAL
  Filled 2024-01-25 (×3): qty 4

## 2024-01-25 MED ORDER — DOCUSATE SODIUM 283 MG RE ENEM: 1.0000 | ENEMA | RECTAL | Status: DC | PRN

## 2024-01-25 MED ORDER — SEVELAMER CARBONATE 800 MG PO TABS: 1600.0000 mg | ORAL_TABLET | Freq: Two times a day (BID) | ORAL | Status: DC | PRN

## 2024-01-25 MED ORDER — DOCUSATE SODIUM 100 MG PO CAPS
100.0000 mg | ORAL_CAPSULE | Freq: Two times a day (BID) | ORAL | Status: DC
  Administered 2024-01-25 – 2024-01-27 (×5): 100 mg via ORAL
  Filled 2024-01-25 (×5): qty 1

## 2024-01-25 MED ORDER — HYDRALAZINE HCL 20 MG/ML IJ SOLN: 5.0000 mg | INTRAMUSCULAR | Status: DC | PRN

## 2024-01-25 MED ORDER — ACETAMINOPHEN 325 MG PO TABS: 650.0000 mg | ORAL_TABLET | Freq: Four times a day (QID) | ORAL | Status: DC | PRN

## 2024-01-25 MED ORDER — SEVELAMER CARBONATE 800 MG PO TABS
2400.0000 mg | ORAL_TABLET | Freq: Three times a day (TID) | ORAL | Status: DC
  Administered 2024-01-25 – 2024-01-27 (×8): 2400 mg via ORAL
  Filled 2024-01-25 (×9): qty 3

## 2024-01-25 NOTE — Progress Notes (Signed)
 PT Cancellation Note  Patient Details Name: Cassandra Joseph MRN: [id number] DOB: August 21, 1945   Cancelled Treatment:    Reason Eval/Treat Not Completed: Medical issues which prohibited therapy Pt with continued uptrending troponins (918 this afternoon).  Will hold PT at this time and see when pt more medically appropriate.  Darice Edelman 01/25/2024, 2:10 PM

## 2024-01-25 NOTE — Care Management Obs Status (Signed)
 MEDICARE OBSERVATION STATUS NOTIFICATION   Patient Details  Name: Cassandra Joseph MRN: [id number] Date of Birth: 1946/02/01   Medicare Observation Status Notification Given:  Rudolph Cost, CMA 01/25/2024, 2:16 PM

## 2024-01-25 NOTE — ED Notes (Signed)
Informed RN bed assigned 

## 2024-01-25 NOTE — Progress Notes (Signed)
 Echocardiogram 2D Echocardiogram has been performed.  Cassandra Joseph 01/25/2024, 4:15 PM

## 2024-01-25 NOTE — ED Notes (Signed)
 With pt permission, this nurse updated pt grand daughter, Judie Noun, on status of patient.

## 2024-01-25 NOTE — Progress Notes (Signed)
 OT Cancellation Note  Patient Details Name: Cassandra Joseph MRN: [id number] DOB: [birth year]   Cancelled Treatment:    Reason Eval/Treat Not Completed: Medical issues which prohibited therapy. Order received, chart revived and pt noted with troponin of 254 on airrval with continued up trend now up to 918. Will hold on therapy until trending down. [name] E Arionna Hoggard 01/25/2024, 1:58 PM

## 2024-01-25 NOTE — Progress Notes (Signed)
 [facility]  ROUNDING NOTE   Subjective:   Patient well-known to us  from prior admissions. She has known history of ESRD on HD TTS, hypertension, hyperlipidemia, CVA, multiple myeloma, rheumatoid arthritis. She came in with respiratory distress.  She did undergo dialysis yesterday and UF achieved was 2.2 kg. Patient noted to be nauseous this a.m.  Objective:  Vital signs in last 24 hours:  Temp:  [98 F (36.7 C)-99.8 F (37.7 C)] 99.8 F (37.7 C) (06/19 1550) Pulse Rate:  [88-107] 96 (06/19 1550) Resp:  [13-23] 16 (06/19 1550) BP: (97-138)/(42-93) 120/66 (06/19 1550) SpO2:  [93 %-100 %] 99 % (06/19 1550)  Weight change:  Filed Weights   01/24/24 1016 01/24/24 1437  Weight: 111.6 kg 111.5 kg    Intake/Output: I/O last 3 completed shifts: In: -  Out: 2200 [Other:2200]   Intake/Output this shift:  Total I/O In: 120 [P.O.:120] Out: -   Physical Exam: General: No acute distress  Head: Normocephalic, atraumatic. Moist oral mucosal membranes  Neck: Supple  Lungs:  Minimal basilar Rales  Heart: S1S2 no rubs  Abdomen:  Soft, nontender, bowel sounds present  Extremities: Trace peripheral edema.  Neurologic: Awake, alert, following commands  Skin: No acute rash  Access: Right upper extremity AV fistula    Basic Metabolic Panel: Recent Labs  Lab 01/24/24 1007 01/25/24 0448  NA 137 132*  K 4.9 4.2  CL 94* 92*  CO2 24 24  GLUCOSE 110* 93  BUN 38* 26*  CREATININE 7.51* 5.27*  CALCIUM  9.0 9.5    Liver Function Tests: Recent Labs  Lab 01/24/24 1007  AST 26  ALT 15  ALKPHOS 59  BILITOT 1.2  PROT 8.7*  ALBUMIN 3.6   No results for input(s): LIPASE, AMYLASE in the last 168 hours. No results for input(s): AMMONIA in the last 168 hours.  CBC: Recent Labs  Lab 01/24/24 1007 01/25/24 0448  WBC 6.7 4.5  NEUTROABS 5.0  --   HGB 10.7* 10.8*  HCT 33.0* 32.9*  MCV 94.3 95.1  PLT 143* 130*    Cardiac Enzymes: No results for input(s):  CKTOTAL, CKMB, CKMBINDEX, TROPONINI in the last 168 hours.  BNP: Invalid input(s): POCBNP  CBG: No results for input(s): GLUCAP in the last 168 hours.  Microbiology: Results for orders placed or performed during the hospital encounter of 02/10/22  MRSA Next Gen by PCR, Nasal     Status: None   Collection Time: 02/11/22  8:27 PM   Specimen: Nasal Mucosa; Nasal Swab  Result Value Ref Range Status   MRSA by PCR Next Gen NOT DETECTED NOT DETECTED Final    Comment: (NOTE) The GeneXpert MRSA Assay (FDA approved for NASAL specimens only), is one component of a comprehensive MRSA colonization surveillance program. It is not intended to diagnose MRSA infection nor to guide or monitor treatment for MRSA infections. Test performance is not FDA approved in patients less than 41 years old. Performed at [facility], [address] [place], [place] [address]     Coagulation Studies: No results for input(s): LABPROT, INR in the last 72 hours.  Urinalysis: No results for input(s): COLORURINE, LABSPEC, PHURINE, GLUCOSEU, HGBUR, BILIRUBINUR, KETONESUR, PROTEINUR, UROBILINOGEN, NITRITE, LEUKOCYTESUR in the last 72 hours.  Invalid input(s): APPERANCEUR    Imaging: DG Chest Port 1 View Result Date: 01/24/2024 CLINICAL DATA:  Shortness of breath. EXAM: PORTABLE CHEST 1 VIEW COMPARISON:  02/13/2022. FINDINGS: Patient is rotated to the right with associated shift of the cardiomediastinal silhouette. Cardiomegaly,  similar to the prior exam. Aortic atherosclerosis. Streaky bibasilar interstitial opacities could reflect atelectasis or edema. No pneumothorax. No acute osseous abnormality. IMPRESSION: Cardiomegaly with streaky bibasilar interstitial opacities, which could reflect atelectasis or edema. Electronically Signed   By: Mannie Seek M.D.   On: 01/24/2024 12:37     Medications:     apixaban   2.5 mg Oral BID   atorvastatin   80 mg Oral  Daily   Chlorhexidine  Gluconate Cloth  6 each Topical Q0600   cinacalcet   60 mg Oral BID WC   citalopram  10 mg Oral Daily   docusate sodium   100 mg Oral BID   [START ON 01/26/2024] epoetin  alfa  4,000 Units Intravenous Q M,W,F-HD   feeding supplement (NEPRO CARB STEADY)  237 mL Oral TID BM   gabapentin   300 mg Oral TID   hydroxychloroquine   200 mg Oral Daily   multivitamin  1 tablet Oral QHS   polyethylene glycol  17 g Oral BID   senna  2 tablet Oral QHS   sevelamer  carbonate  2,400 mg Oral TID with meals   sodium chloride  flush  3 mL Intravenous Q12H   acetaminophen  **OR** acetaminophen , albuterol , ALPRAZolam , calcium  carbonate (dosed in mg elemental calcium ), camphor-menthol  **AND** hydrOXYzine, docusate sodium , feeding supplement (NEPRO CARB STEADY), hydrALAZINE , ondansetron  **OR** ondansetron  (ZOFRAN ) IV, sevelamer  carbonate, sorbitol  Assessment/ Plan:  78 y.o. female with past medical history of Brenner tumor status post TAH/BSO, ESRD on HD MWF, hypertension, hyperlipidemia, CVA, multiple myeloma, rheumatoid arthritis, anemia of chronic disease, secondary hyperparathyroidism, history of CVA, COVID-19 pneumonia, history of septic arthritis who presented with volume overload.  [facility]/[address]/MWF/RUE AVF  1.  ESRD on HD MWF.  Patient underwent hemodialysis treatment yesterday.  Tolerated well.  UF achieved was 2.2 kg.  Next also treatment scheduled for tomorrow if still here.  May need to adjust outpatient dry weight.  2.  Acute respiratory failure.  Patient was significantly volume overloaded upon admission.  This is improved with dialysis.  3.  Anemia of chronic kidney disease.  Lab Results  Component Value Date   HGB 10.8 (L) 01/25/2024  Hemoglobin 10.8.  Maintain the patient on Epogen  4000's IV with dialysis.  4.  Secondary hyperparathyroidism.  Maintain the patient on Renvela  24 mg 3 times daily with meals and monitor bone metabolism parameters over the course of the  hospitalization.    LOS: 0 Nettie Wyffels 6/19/20254:34 PM

## 2024-01-25 NOTE — ED Notes (Signed)
 Pt is a high fall risk, pt has entire fall bundle in place at this time with fall risk bracelet, non skid socks, and bed alarm.

## 2024-01-25 NOTE — Plan of Care (Signed)

## 2024-01-25 NOTE — Progress Notes (Signed)
 PROGRESS NOTE    Cassandra Joseph  JYN:[id number] DOB: 07-08-1946 DOA: 01/24/2024 PCP: Antonio Baumgarten, MD   Brief Narrative:    Cassandra Joseph is a 78 y.o. female with medical history significant of Brenner tumor s/p TAH/BSO (2010), ESRD on HD, HTN, HLD, CVA, multiple myeloma, and RA who presented on 6/18 with respiratory distress. She did not feel well on Monday post-HD.  She felt worse that night, weak and tired.  She became SOB on Tuesday AM and this has worsened since.  +productive cough with yellow/brown/clear phlegm.  The daughter who lives with her is also sick with a cold.  No fever.  No h/o afib.  No longer smokes.  Wears 3L home O2 during the day and CPAP at night.  She is non-ambulatory at baseline, uses a Hoyer lift to move from bed to chair and back.  Patient was admitted for hyperkalemia in the setting of ESRD/HD and has received hemodialysis and continues to have some elevated troponin levels.  Assessment & Plan:   Principal Problem:   Hypervolemia associated with renal insufficiency Active Problems:   Multiple myeloma (HCC)   Essential hypertension   ESRD on hemodialysis (HCC)   PAF (paroxysmal atrial fibrillation) (HCC)   OSA on CPAP   HLD (hyperlipidemia)   Rheumatoid arthritis with positive rheumatoid factor (HCC)   Chronic respiratory failure with hypoxia (HCC)  Assessment and Plan:   Hypervolemia from renal insufficiency Patient on chronic MWF HD Has not missed HD (other than today) but she is concerned that they recorded her weight wrong on Monday and/or did not take enough fluid off Nephrology prn order set utilized Nephrology consulted for urgent HD Initially required BIPAP but she was weaned to [place] O2 by the time I saw her  Elevated troponin Continue to trend as this is upward trending Currently without chest pain 2D echocardiogram unremarkable   ESRD on HD Patient on chronic MWF HD Nephrology prn order set utilized Plan for further HD in  a.m. Continue Sensipar , Renvela , Epogen , rena-vite   Atrial fibrillation  Patient presenting with reported new-onset afib but she has had this in the past according to review of records Will admit to telemetry She is currently rate controlled without medication Will request Echocardiogram for further evaluation  CHA2DS2-VASc Score is >2 and so patient would benefit from oral anticoagulation Initiate Eliquis  low-dose given prior history of GI bleed in 2023   COPD On chronic home O2, 3L   HTN Discontinue amlodipine  - she may need an alternative nodal agent for BP/rate control Hold midodrine  for now and resume if needed Will also add prn hydralazine    HLD Continue Lipitor    Mood d/o Continue alprazolam , citalopram   Multiple myeloma Appears to have been lost to f/u since 2022 - will notify Dr. Valentine Gasmen Calcium  is normal at this time No recent imaging Recommend outpatient f/u   RA Continue hydroxychloroquine    Ambulatory dysfunction She is non-ambulatory and does not transfer at baseline Uses a Hoyer lift at home [place] NP visits at home by PCP PT evaluation pending   OSA Continue nocturnal CPAP   Class 2 obesity Body mass index is 39.68 kg/m.Aaron Aas  Weight loss should be encouraged Outpatient PCP/bariatric medicine f/u encouraged Significantly low or high BMI is associated with higher medical risk including morbidity and mortality      DVT prophylaxis:apixaban  Code Status: Full Family Communication: None at bedside Disposition Plan:  Status is: Observation The patient will require care spanning > 2 midnights  and should be moved to inpatient because: Need for IV medications and close management   Consultants:  Nephrology  Procedures:  None  Antimicrobials:  None   Subjective: Patient seen and evaluated today with no new acute complaints or concerns. No acute concerns or events noted overnight.  Currently on baseline 3 L nasal  cannula.  Objective: Vitals:   01/25/24 1000 01/25/24 1044 01/25/24 1045 01/25/24 1550  BP: 115/61 132/64 132/64 120/66  Pulse: 100 91 91 96  Resp: 20 17 20 16   Temp:  98.5 F (36.9 C) 98.5 F (36.9 C) 99.8 F (37.7 C)  TempSrc:   Oral   SpO2: 97% 98% 98% 99%  Weight:      Height:        Intake/Output Summary (Last 24 hours) at 01/25/2024 1647 Last data filed at 01/25/2024 1633 Gross per 24 hour  Intake 120 ml  Output 2200 ml  Net -2080 ml   Filed Weights   01/24/24 1016 01/24/24 1437  Weight: 111.6 kg 111.5 kg    Examination:  General exam: Appears calm and comfortable, obese Respiratory system: Clear to auscultation. Respiratory effort normal.  3 L nasal cannula Cardiovascular system: S1 & S2 heard, RRR.  Gastrointestinal system: Abdomen is soft Central nervous system: Alert and awake Extremities: No edema Skin: No significant lesions noted Psychiatry: Flat affect.    Data Reviewed: I have personally reviewed following labs and imaging studies  CBC: Recent Labs  Lab 01/24/24 1007 01/25/24 0448  WBC 6.7 4.5  NEUTROABS 5.0  --   HGB 10.7* 10.8*  HCT 33.0* 32.9*  MCV 94.3 95.1  PLT 143* 130*   Basic Metabolic Panel: Recent Labs  Lab 01/24/24 1007 01/25/24 0448  NA 137 132*  K 4.9 4.2  CL 94* 92*  CO2 24 24  GLUCOSE 110* 93  BUN 38* 26*  CREATININE 7.51* 5.27*  CALCIUM  9.0 9.5   GFR: Estimated Creatinine Clearance: 11.3 mL/min (A) (by C-G formula based on SCr of 5.27 mg/dL (H)). Liver Function Tests: Recent Labs  Lab 01/24/24 1007  AST 26  ALT 15  ALKPHOS 59  BILITOT 1.2  PROT 8.7*  ALBUMIN 3.6   No results for input(s): LIPASE, AMYLASE in the last 168 hours. No results for input(s): AMMONIA in the last 168 hours. Coagulation Profile: No results for input(s): INR, PROTIME in the last 168 hours. Cardiac Enzymes: No results for input(s): CKTOTAL, CKMB, CKMBINDEX, TROPONINI in the last 168 hours. BNP (last 3  results) No results for input(s): PROBNP in the last 8760 hours. HbA1C: No results for input(s): HGBA1C in the last 72 hours. CBG: No results for input(s): GLUCAP in the last 168 hours. Lipid Profile: No results for input(s): CHOL, HDL, LDLCALC, TRIG, CHOLHDL, LDLDIRECT in the last 72 hours. Thyroid Function Tests: No results for input(s): TSH, T4TOTAL, FREET4, T3FREE, THYROIDAB in the last 72 hours. Anemia Panel: No results for input(s): VITAMINB12, FOLATE, FERRITIN, TIBC, IRON, RETICCTPCT in the last 72 hours. Sepsis Labs: No results for input(s): PROCALCITON, LATICACIDVEN in the last 168 hours.  No results found for this or any previous visit (from the past 240 hours).       Radiology Studies: ECHOCARDIOGRAM COMPLETE Result Date: 01/25/2024    ECHOCARDIOGRAM REPORT   Patient Name:   Cassandra Joseph Date of Exam: 01/25/2024 Medical Rec #:  [id number]        Height:       66.0 in Accession #:    [id number]  Weight:       245.8 lb Date of Birth:  04/21/1946        BSA:          2.183 m Patient Age:    78 years         BP:           132/64 mmHg Patient Gender: F                HR:           107 bpm. Exam Location:  [facility] Procedure: 2D Echo, Cardiac Doppler and Color Doppler (Both Spectral and Color            Flow Doppler were utilized during procedure). Indications:     Atrial Fibrillation I48.91  History:         Patient has prior history of Echocardiogram examinations, most                  recent 04/28/2021. CHF, ESRD, Arrythmias:Atrial Fibrillation,                  Signs/Symptoms:Hypotension; Risk Factors:Hypertension, Sleep                  Apnea and Dyslipidemia.  Sonographer:     Terrilee Few RCS Referring Phys:  [id number] Hartley Line D [facility] Diagnosing Phys: Belva Boyden MD IMPRESSIONS  1. Left ventricular ejection fraction, by estimation, is 60 to 65%. The left ventricle has normal function. The left ventricle has no regional wall motion  abnormalities. Left ventricular diastolic parameters are indeterminate.  2. Right ventricular systolic function is normal. The right ventricular size is normal. There is mildly elevated pulmonary artery systolic pressure. The estimated right ventricular systolic pressure is 39.6 mmHg.  3. Left atrial size was moderately dilated.  4. The mitral valve is normal in structure. Mild to moderate mitral valve regurgitation. Mild mitral stenosis. The mean mitral valve gradient is 4.5 mmHg.  5. Tricuspid valve regurgitation is mild to moderate.  6. The aortic valve is normal in structure. Aortic valve regurgitation is not visualized. Aortic valve sclerosis is present, with no evidence of aortic valve stenosis.  7. The inferior vena cava is normal in size with greater than 50% respiratory variability, suggesting right atrial pressure of 3 mmHg. FINDINGS  Left Ventricle: Left ventricular ejection fraction, by estimation, is 60 to 65%. The left ventricle has normal function. The left ventricle has no regional wall motion abnormalities. Strain was performed and the global longitudinal strain is indeterminate. The left ventricular internal cavity size was normal in size. There is no left ventricular hypertrophy. Left ventricular diastolic parameters are indeterminate. Right Ventricle: The right ventricular size is normal. No increase in right ventricular wall thickness. Right ventricular systolic function is normal. There is mildly elevated pulmonary artery systolic pressure. The tricuspid regurgitant velocity is 2.94  m/s, and with an assumed right atrial pressure of 5 mmHg, the estimated right ventricular systolic pressure is 39.6 mmHg. Left Atrium: Left atrial size was moderately dilated. Right Atrium: Right atrial size was normal in size. Pericardium: There is no evidence of pericardial effusion. Mitral Valve: The mitral valve is normal in structure. Mild mitral annular calcification. Mild to moderate mitral valve  regurgitation. Mild mitral valve stenosis. MV peak gradient, 10.2 mmHg. The mean mitral valve gradient is 4.5 mmHg. Tricuspid Valve: The tricuspid valve is normal in structure. Tricuspid valve regurgitation is mild to moderate. No evidence of tricuspid stenosis. Aortic Valve: The aortic valve  is normal in structure. Aortic valve regurgitation is not visualized. Aortic valve sclerosis is present, with no evidence of aortic valve stenosis. Aortic valve peak gradient measures 10.4 mmHg. Pulmonic Valve: The pulmonic valve was normal in structure. Pulmonic valve regurgitation is not visualized. No evidence of pulmonic stenosis. Aorta: The aortic root is normal in size and structure. Venous: The inferior vena cava is normal in size with greater than 50% respiratory variability, suggesting right atrial pressure of 3 mmHg. IAS/Shunts: No atrial level shunt detected by color flow Doppler. Additional Comments: 3D was performed not requiring image post processing on an independent workstation and was indeterminate.  LEFT VENTRICLE PLAX 2D LVIDd:         4.46 cm   Diastology LVIDs:         2.80 cm   LV e' medial:    5.69 cm/s LV PW:         1.16 cm   LV E/e' medial:  24.8 LV IVS:        1.10 cm   LV e' lateral:   6.07 cm/s LVOT diam:     2.20 cm   LV E/e' lateral: 23.2 LV SV:         52 LV SV Index:   24 LVOT Area:     3.80 cm  RIGHT VENTRICLE            IVC RV S prime:     8.40 cm/s  IVC diam: 1.91 cm TAPSE (M-mode): 1.6 cm LEFT ATRIUM            Index        RIGHT ATRIUM           Index LA diam:      6.00 cm  2.75 cm/m   RA Area:     21.80 cm LA Vol (A2C): 135.5 ml 62.07 ml/m  RA Volume:   64.20 ml  29.41 ml/m LA Vol (A4C): 109.0 ml 49.93 ml/m  AORTIC VALVE AV Area (Vmax): 2.57 cm AV Vmax:        161.00 cm/s AV Peak Grad:   10.4 mmHg LVOT Vmax:      109.00 cm/s LVOT Vmean:     64.500 cm/s LVOT VTI:       0.136 m  AORTA Ao Root diam: 3.30 cm Ao Arch diam: 3.1 cm MITRAL VALVE                TRICUSPID VALVE MV Area (PHT):  3.17 cm     TR Peak grad:   34.6 mmHg MV Area VTI:   1.72 cm     TR Vmax:        294.00 cm/s MV Peak grad:  10.2 mmHg MV Mean grad:  4.5 mmHg     SHUNTS MV Vmax:       1.60 m/s     Systemic VTI:  0.14 m MV Vmean:      96.6 cm/s    Systemic Diam: 2.20 cm MV Decel Time: 239 msec MV E velocity: 141.00 cm/s MV A velocity: 80.10 cm/s MV E/A ratio:  1.76 Belva Boyden MD Electronically signed by Belva Boyden MD Signature Date/Time: 01/25/2024/4:37:52 PM    Final    DG Chest Port 1 View Result Date: 01/24/2024 CLINICAL DATA:  Shortness of breath. EXAM: PORTABLE CHEST 1 VIEW COMPARISON:  02/13/2022. FINDINGS: Patient is rotated to the right with associated shift of the cardiomediastinal silhouette. Cardiomegaly, similar to the prior exam. Aortic atherosclerosis. Streaky bibasilar  interstitial opacities could reflect atelectasis or edema. No pneumothorax. No acute osseous abnormality. IMPRESSION: Cardiomegaly with streaky bibasilar interstitial opacities, which could reflect atelectasis or edema. Electronically Signed   By: Mannie Seek M.D.   On: 01/24/2024 12:37        Scheduled Meds:  apixaban   2.5 mg Oral BID   atorvastatin   80 mg Oral Daily   Chlorhexidine  Gluconate Cloth  6 each Topical Q0600   cinacalcet   60 mg Oral BID WC   citalopram  10 mg Oral Daily   docusate sodium   100 mg Oral BID   [START ON 01/26/2024] epoetin  alfa  4,000 Units Intravenous Q M,W,F-HD   feeding supplement (NEPRO CARB STEADY)  237 mL Oral TID BM   gabapentin   300 mg Oral TID   hydroxychloroquine   200 mg Oral Daily   multivitamin  1 tablet Oral QHS   polyethylene glycol  17 g Oral BID   senna  2 tablet Oral QHS   sevelamer  carbonate  2,400 mg Oral TID with meals   sodium chloride  flush  3 mL Intravenous Q12H     LOS: 0 days    Time spent: 55 minutes    Tashawnda Bleiler Loran Rock, DO [facility]  If 7PM-7AM, please contact night-coverage [email or web address] 01/25/2024, 4:47 PM

## 2024-01-26 DIAGNOSIS — N289 Disorder of kidney and ureter, unspecified: Secondary | ICD-10-CM | POA: Diagnosis not present

## 2024-01-26 DIAGNOSIS — E8770 Fluid overload, unspecified: Secondary | ICD-10-CM | POA: Diagnosis not present

## 2024-01-26 LAB — CBC
HCT: 31 % — ABNORMAL LOW (ref 36.0–46.0)
Hemoglobin: 10.4 g/dL — ABNORMAL LOW (ref 12.0–15.0)
MCH: 31.2 pg (ref 26.0–34.0)
MCHC: 33.5 g/dL (ref 30.0–36.0)
MCV: 93.1 fL (ref 80.0–100.0)
Platelets: 124 10*3/uL — ABNORMAL LOW (ref 150–400)
RBC: 3.33 MIL/uL — ABNORMAL LOW (ref 3.87–5.11)
RDW: 14.8 % (ref 11.5–15.5)
WBC: 4.6 10*3/uL (ref 4.0–10.5)
nRBC: 0 % (ref 0.0–0.2)

## 2024-01-26 LAB — BASIC METABOLIC PANEL WITH GFR
Anion gap: 12 (ref 5–15)
BUN: 41 mg/dL — ABNORMAL HIGH (ref 8–23)
CO2: 27 mmol/L (ref 22–32)
Calcium: 8.3 mg/dL — ABNORMAL LOW (ref 8.9–10.3)
Chloride: 93 mmol/L — ABNORMAL LOW (ref 98–111)
Creatinine, Ser: 6.7 mg/dL — ABNORMAL HIGH (ref 0.44–1.00)
GFR, Estimated: 6 mL/min — ABNORMAL LOW (ref >60)
Glucose, Bld: 89 mg/dL (ref 70–99)
Potassium: 4.1 mmol/L (ref 3.5–5.1)
Sodium: 132 mmol/L — ABNORMAL LOW (ref 135–145)

## 2024-01-26 LAB — TROPONIN I (HIGH SENSITIVITY)
Troponin I (High Sensitivity): 746 ng/L (ref <18)
Troponin I (High Sensitivity): 770 ng/L (ref <18)

## 2024-01-26 LAB — MAGNESIUM: Magnesium: 1.9 mg/dL (ref 1.7–2.4)

## 2024-01-26 MED ORDER — APIXABAN 2.5 MG PO TABS: 2.5000 mg | ORAL_TABLET | Freq: Two times a day (BID) | ORAL | 2 refills | Status: AC

## 2024-01-26 MED ORDER — SALINE SPRAY 0.65 % NA SOLN: 1.0000 | NASAL | 0 refills | Status: AC | PRN

## 2024-01-26 MED ORDER — GUAIFENESIN ER 600 MG PO TB12: 600.0000 mg | ORAL_TABLET | Freq: Two times a day (BID) | ORAL | 2 refills | Status: AC

## 2024-01-26 NOTE — Progress Notes (Signed)
 OT Cancellation Note  Patient Details Name: Cassandra Joseph MRN: [id number] DOB: February 06, 1946   Cancelled Treatment:    Reason Eval/Treat Not Completed: OT screened, no needs identified, will sign off. Order received, chart reviewed. Per conversation with pt and chart review, pt is dependent at baseline for ADLs, reports hoyer use and 24/7 care from family. On arrivla pt self-feeding IND, appears at baseline. No skilled OT needs identified. Will sign off. Please re-consult if additional needs arise.   Gordan Latina, M.S. OTR/L  01/26/24, 9:15 AM  ascom [phone]

## 2024-01-26 NOTE — TOC Initial Note (Signed)
 Transition of Care [facility]) - Initial/Assessment Note    Patient Details  Name: Cassandra Joseph MRN: [id number] Date of Birth: 07/30/1946  [facility] CM/SW Contact:    Crayton Docker, RN 01/26/2024, 11:07 AM  Clinical Narrative:                  Discharge orders noted for patient. CM call to patient's daughter, Wallene Gum, phone: [phone] regarding discharge orders for home today. Per patient's daughter, Wallene Gum cannot pick up patient today and requests CM to call patient's daughter, Dina Francisco that lives with patient and provides caregiver support. CM call to patent's daughter, Pamela,phone: [phone] regarding discharge orders for home today. Per patient's daughter, cannot pick up patient because patient's daughter just left doctor and is sick and patient's granddaughter is at work today and will ask granddaughter to pick up patient tomorrow. CM alert to Dr. Mason Sole.  Expected Discharge Plan: Home/Self Care (Resumption of Outpatient Hemodialysis) Barriers to Discharge: No Barriers Identified   Patient Goals and CMS Choice    Home/self care Resumption of Outpatient Hemodialysis   Expected Discharge Plan and Services    Home/self care Resumption of Outpatient Hemodialysis     Expected Discharge Date: 01/26/24                 Prior Living Arrangements/Services    Home with family caregiver support          Need for Family Participation in Patient Care: Yes (Comment) Care giver support system in place?: Yes (comment) Current home services: DME (wheelchair) Criminal Activity/Legal Involvement Pertinent to Current Situation/Hospitalization: No - Comment as needed  Activities of Daily Living   ADL Screening (condition at time of admission) Independently performs ADLs?: No Does the patient have a NEW difficulty with bathing/dressing/toileting/self-feeding that is expected to last >3 days?: Yes (Initiates electronic notice to provider for possible OT consult) Does the  patient have a NEW difficulty with getting in/out of bed, walking, or climbing stairs that is expected to last >3 days?: Yes (Initiates electronic notice to provider for possible PT consult) Does the patient have a NEW difficulty with communication that is expected to last >3 days?: No Is the patient deaf or have difficulty hearing?: No Does the patient have difficulty seeing, even when wearing glasses/contacts?: No Does the patient have difficulty concentrating, remembering, or making decisions?: Yes  Permission Sought/Granted Permission sought to share information with : Case Manager, Family Supports    Share Information with NAME: Wallene Gum Ingram/Pamela Jacober/[name] Linwood Ricker     Emotional Assessment     Affect (typically observed): Unable to Assess   Admission diagnosis:  New onset atrial fibrillation (HCC) [I48.91] Elevated troponin [R79.89] Hypervolemia, unspecified hypervolemia type [E87.70] Hypervolemia associated with renal insufficiency [E87.70, N28.9] Acute hypoxic respiratory failure (HCC) [J96.01] Hypervolemia [E87.70] Patient Active Problem List   Diagnosis Date Noted   Hypervolemia 01/25/2024   Hypervolemia associated with renal insufficiency 01/24/2024   Rectal bleeding 02/11/2022   Anemia 02/10/2022   Melena 02/10/2022   Abdominal pain 02/10/2022   Hypotension 11/19/2021   ESRD on hemodialysis (HCC) 04/28/2021   PAF (paroxysmal atrial fibrillation) (HCC) 04/28/2021   Chronic respiratory failure with hypoxia (HCC) 04/28/2021   OSA on CPAP 04/28/2021   Multiple myeloma in relapse (HCC) 04/28/2021   Rheumatoid arthritis with positive rheumatoid factor (HCC) 09/27/2020   Acute on chronic diastolic CHF (congestive heart failure) (HCC) 01/18/2017   HLD (hyperlipidemia) 01/18/2017   Essential hypertension 11/09/2016   Multiple myeloma (HCC) 11/09/2016  Metabolic syndrome 11/09/2016   PCP:  Antonio Baumgarten, MD Pharmacy:   [facility] [phone] - [place], [place] - [address] [place] [address] Phone: [phone] Fax: [phone]     Social Drivers of Health (SDOH) Social History: SDOH Screenings   Food Insecurity: No Food Insecurity (01/25/2024)  Housing: Low Risk  (01/25/2024)  Transportation Needs: No Transportation Needs (01/25/2024)  Utilities: Not At Risk (01/25/2024)  Financial Resource Strain: Medium Risk (03/17/2022)   [facility] from [facility]  Physical Activity: Inactive (03/17/2022)   Received from [facility]  Social Connections: Socially Integrated (01/25/2024)  Stress: Stress Concern Present (03/17/2022)   Received from [facility]  Tobacco Use: Medium Risk (01/24/2024)   SDOH Interventions:     Readmission Risk Interventions    02/15/2022    4:15 PM 05/21/2021   12:18 PM  Readmission Risk Prevention Plan  Transportation Screening Complete Complete  HRI or Home Care Consult Complete   Social Work Consult for Recovery Care Planning/Counseling Complete   Palliative Care Screening Not Applicable   Medication Review Oceanographer) Complete Complete  PCP or Specialist appointment within 3-5 days of discharge  Complete  HRI or Home Care Consult  Complete  SW Recovery Care/Counseling Consult  Complete  Palliative Care Screening  Not Applicable  Skilled Nursing Facility  Not Applicable

## 2024-01-26 NOTE — Progress Notes (Signed)
 [facility]  ROUNDING NOTE   Subjective:   Patient well-known to us  from prior admissions. She has known history of ESRD on HD TTS, hypertension, hyperlipidemia, CVA, multiple myeloma, rheumatoid arthritis. She came in with respiratory distress.  Update:  Patient resting comfortably in bed.  Due for dialysis treatment today.  Objective:  Vital signs in last 24 hours:  Temp:  [97.6 F (36.4 C)-99.8 F (37.7 C)] 98.6 F (37 C) (06/20 1539) Pulse Rate:  [87-99] 97 (06/20 1539) Resp:  [16-19] 19 (06/20 1539) BP: (94-120)/(60-87) 94/62 (06/20 1539) SpO2:  [98 %-100 %] 100 % (06/20 1539) Weight:  [109.9 kg] 109.9 kg (06/20 0500)  Weight change: -1.685 kg Filed Weights   01/24/24 1016 01/24/24 1437 01/26/24 0500  Weight: 111.6 kg 111.5 kg 109.9 kg    Intake/Output: I/O last 3 completed shifts: In: 360 [P.O.:360] Out: 2200 [Other:2200]   Intake/Output this shift:  No intake/output data recorded.  Physical Exam: General: No acute distress  Head: Normocephalic, atraumatic. Moist oral mucosal membranes  Neck: Supple  Lungs:  Minimal basilar Rales  Heart: S1S2 no rubs  Abdomen:  Soft, nontender, bowel sounds present  Extremities: Trace peripheral edema.  Neurologic: Awake, alert, following commands  Skin: No acute rash  Access: Right upper extremity AV fistula    Basic Metabolic Panel: Recent Labs  Lab 01/24/24 1007 01/25/24 0448 01/26/24 0109  NA 137 132* 132*  K 4.9 4.2 4.1  CL 94* 92* 93*  CO2 24 24 27   GLUCOSE 110* 93 89  BUN 38* 26* 41*  CREATININE 7.51* 5.27* 6.70*  CALCIUM  9.0 9.5 8.3*  MG  --   --  1.9    Liver Function Tests: Recent Labs  Lab 01/24/24 1007  AST 26  ALT 15  ALKPHOS 59  BILITOT 1.2  PROT 8.7*  ALBUMIN 3.6   No results for input(s): LIPASE, AMYLASE in the last 168 hours. No results for input(s): AMMONIA in the last 168 hours.  CBC: Recent Labs  Lab 01/24/24 1007 01/25/24 0448 01/26/24 0109  WBC 6.7  4.5 4.6  NEUTROABS 5.0  --   --   HGB 10.7* 10.8* 10.4*  HCT 33.0* 32.9* 31.0*  MCV 94.3 95.1 93.1  PLT 143* 130* 124*    Cardiac Enzymes: No results for input(s): CKTOTAL, CKMB, CKMBINDEX, TROPONINI in the last 168 hours.  BNP: Invalid input(s): POCBNP  CBG: No results for input(s): GLUCAP in the last 168 hours.  Microbiology: Results for orders placed or performed during the hospital encounter of 02/10/22  MRSA Next Gen by PCR, Nasal     Status: None   Collection Time: 02/11/22  8:27 PM   Specimen: Nasal Mucosa; Nasal Swab  Result Value Ref Range Status   MRSA by PCR Next Gen NOT DETECTED NOT DETECTED Final    Comment: (NOTE) The GeneXpert MRSA Assay (FDA approved for NASAL specimens only), is one component of a comprehensive MRSA colonization surveillance program. It is not intended to diagnose MRSA infection nor to guide or monitor treatment for MRSA infections. Test performance is not FDA approved in patients less than 18 years old. Performed at [facility], [address] [place], [place] [address]     Coagulation Studies: No results for input(s): LABPROT, INR in the last 72 hours.  Urinalysis: No results for input(s): COLORURINE, LABSPEC, PHURINE, GLUCOSEU, HGBUR, BILIRUBINUR, KETONESUR, PROTEINUR, UROBILINOGEN, NITRITE, LEUKOCYTESUR in the last 72 hours.  Invalid input(s): APPERANCEUR    Imaging: ECHOCARDIOGRAM COMPLETE Result Date:  01/25/2024    ECHOCARDIOGRAM REPORT   Patient Name:   Cassandra Joseph Date of Exam: 01/25/2024 Medical Rec #:  [id number]        Height:       66.0 in Accession #:    [id number]       Weight:       245.8 lb Date of Birth:  1946/02/23        BSA:          2.183 m Patient Age:    78 years         BP:           132/64 mmHg Patient Gender: F                HR:           107 bpm. Exam Location:  [facility] Procedure: 2D Echo, Cardiac Doppler and Color Doppler (Both Spectral and Color             Flow Doppler were utilized during procedure). Indications:     Atrial Fibrillation I48.91  History:         Patient has prior history of Echocardiogram examinations, most                  recent 04/28/2021. CHF, ESRD, Arrythmias:Atrial Fibrillation,                  Signs/Symptoms:Hypotension; Risk Factors:Hypertension, Sleep                  Apnea and Dyslipidemia.  Sonographer:     Terrilee Few RCS Referring Phys:  [id number] [facility] Diagnosing Phys: Belva Boyden MD IMPRESSIONS  1. Left ventricular ejection fraction, by estimation, is 60 to 65%. The left ventricle has normal function. The left ventricle has no regional wall motion abnormalities. Left ventricular diastolic parameters are indeterminate.  2. Right ventricular systolic function is normal. The right ventricular size is normal. There is mildly elevated pulmonary artery systolic pressure. The estimated right ventricular systolic pressure is 39.6 mmHg.  3. Left atrial size was moderately dilated.  4. The mitral valve is normal in structure. Mild to moderate mitral valve regurgitation. Mild mitral stenosis. The mean mitral valve gradient is 4.5 mmHg.  5. Tricuspid valve regurgitation is mild to moderate.  6. The aortic valve is normal in structure. Aortic valve regurgitation is not visualized. Aortic valve sclerosis is present, with no evidence of aortic valve stenosis.  7. The inferior vena cava is normal in size with greater than 50% respiratory variability, suggesting right atrial pressure of 3 mmHg. FINDINGS  Left Ventricle: Left ventricular ejection fraction, by estimation, is 60 to 65%. The left ventricle has normal function. The left ventricle has no regional wall motion abnormalities. Strain was performed and the global longitudinal strain is indeterminate. The left ventricular internal cavity size was normal in size. There is no left ventricular hypertrophy. Left ventricular diastolic parameters are indeterminate. Right Ventricle: The  right ventricular size is normal. No increase in right ventricular wall thickness. Right ventricular systolic function is normal. There is mildly elevated pulmonary artery systolic pressure. The tricuspid regurgitant velocity is 2.94  m/s, and with an assumed right atrial pressure of 5 mmHg, the estimated right ventricular systolic pressure is 39.6 mmHg. Left Atrium: Left atrial size was moderately dilated. Right Atrium: Right atrial size was normal in size. Pericardium: There is no evidence of pericardial effusion. Mitral Valve: The mitral valve is normal in structure. Mild  mitral annular calcification. Mild to moderate mitral valve regurgitation. Mild mitral valve stenosis. MV peak gradient, 10.2 mmHg. The mean mitral valve gradient is 4.5 mmHg. Tricuspid Valve: The tricuspid valve is normal in structure. Tricuspid valve regurgitation is mild to moderate. No evidence of tricuspid stenosis. Aortic Valve: The aortic valve is normal in structure. Aortic valve regurgitation is not visualized. Aortic valve sclerosis is present, with no evidence of aortic valve stenosis. Aortic valve peak gradient measures 10.4 mmHg. Pulmonic Valve: The pulmonic valve was normal in structure. Pulmonic valve regurgitation is not visualized. No evidence of pulmonic stenosis. Aorta: The aortic root is normal in size and structure. Venous: The inferior vena cava is normal in size with greater than 50% respiratory variability, suggesting right atrial pressure of 3 mmHg. IAS/Shunts: No atrial level shunt detected by color flow Doppler. Additional Comments: 3D was performed not requiring image post processing on an independent workstation and was indeterminate.  LEFT VENTRICLE PLAX 2D LVIDd:         4.46 cm   Diastology LVIDs:         2.80 cm   LV e' medial:    5.69 cm/s LV PW:         1.16 cm   LV E/e' medial:  24.8 LV IVS:        1.10 cm   LV e' lateral:   6.07 cm/s LVOT diam:     2.20 cm   LV E/e' lateral: 23.2 LV SV:         52 LV SV  Index:   24 LVOT Area:     3.80 cm  RIGHT VENTRICLE            IVC RV S prime:     8.40 cm/s  IVC diam: 1.91 cm TAPSE (M-mode): 1.6 cm LEFT ATRIUM            Index        RIGHT ATRIUM           Index LA diam:      6.00 cm  2.75 cm/m   RA Area:     21.80 cm LA Vol (A2C): 135.5 ml 62.07 ml/m  RA Volume:   64.20 ml  29.41 ml/m LA Vol (A4C): 109.0 ml 49.93 ml/m  AORTIC VALVE AV Area (Vmax): 2.57 cm AV Vmax:        161.00 cm/s AV Peak Grad:   10.4 mmHg LVOT Vmax:      109.00 cm/s LVOT Vmean:     64.500 cm/s LVOT VTI:       0.136 m  AORTA Ao Root diam: 3.30 cm Ao Arch diam: 3.1 cm MITRAL VALVE                TRICUSPID VALVE MV Area (PHT): 3.17 cm     TR Peak grad:   34.6 mmHg MV Area VTI:   1.72 cm     TR Vmax:        294.00 cm/s MV Peak grad:  10.2 mmHg MV Mean grad:  4.5 mmHg     SHUNTS MV Vmax:       1.60 m/s     Systemic VTI:  0.14 m MV Vmean:      96.6 cm/s    Systemic Diam: 2.20 cm MV Decel Time: 239 msec MV E velocity: 141.00 cm/s MV A velocity: 80.10 cm/s MV E/A ratio:  1.76 Belva Boyden MD Electronically signed by Belva Boyden MD Signature Date/Time: 01/25/2024/4:37:52 PM  Final      Medications:     apixaban   2.5 mg Oral BID   atorvastatin   80 mg Oral Daily   Chlorhexidine  Gluconate Cloth  6 each Topical Q0600   cinacalcet   60 mg Oral BID WC   citalopram  10 mg Oral Daily   docusate sodium   100 mg Oral BID   epoetin  alfa  4,000 Units Intravenous Q M,W,F-HD   feeding supplement (NEPRO CARB STEADY)  237 mL Oral TID BM   gabapentin   300 mg Oral TID   hydroxychloroquine   200 mg Oral Daily   multivitamin  1 tablet Oral QHS   polyethylene glycol  17 g Oral BID   senna  2 tablet Oral QHS   sevelamer  carbonate  2,400 mg Oral TID with meals   sodium chloride  flush  3 mL Intravenous Q12H   acetaminophen  **OR** acetaminophen , albuterol , ALPRAZolam , calcium  carbonate (dosed in mg elemental calcium ), camphor-menthol  **AND** hydrOXYzine, docusate sodium , feeding supplement (NEPRO CARB  STEADY), hydrALAZINE , ondansetron  **OR** ondansetron  (ZOFRAN ) IV, sevelamer  carbonate, sorbitol  Assessment/ Plan:  78 y.o. female with past medical history of Brenner tumor status post TAH/BSO, ESRD on HD MWF, hypertension, hyperlipidemia, CVA, multiple myeloma, rheumatoid arthritis, anemia of chronic disease, secondary hyperparathyroidism, history of CVA, COVID-19 pneumonia, history of septic arthritis who presented with volume overload.  [facility]/[address]/MWF/RUE AVF  1.  ESRD on HD MWF.  Patient due for dialysis treatment today.  Orders have been prepared.  2.  Acute respiratory failure.  Patient was significantly volume overloaded upon admission.  Continue ultrafiltration with dialysis to aid with her respiratory status.  3.  Anemia of chronic kidney disease.  Lab Results  Component Value Date   HGB 10.4 (L) 01/26/2024  Hemoglobin currently at target.  Maintain the patient on Epogen  4000's IV with dialysis.  4.  Secondary hyperparathyroidism.  Maintain the patient on Renvela  2400 mg 3 times daily with meals and monitor bone metabolism parameters over the course of the hospitalization.    LOS: 1 Drayk Humbarger 6/20/20253:44 PM

## 2024-01-26 NOTE — Progress Notes (Signed)
 PT Cancellation Note  Patient Details Name: Cassandra Joseph MRN: [id number] DOB: Sep 30, 1945   Cancelled Treatment:    Reason Eval/Treat Not Completed: PT screened, no needs identified, will sign off.  Patient reported that she requires 100% total care for all functional tasks including rolling and family uses a hoyer lift for transfers.  Pt reports being total care for over a year.  No skilled PT needs at this time. Will complete PT orders at this time but will reassess pt pending a change in status upon receipt of new PT orders.    Lavenia Post PT, DPT 01/26/24, 9:12 AM

## 2024-01-26 NOTE — Discharge Summary (Signed)
 Physician Discharge Summary  ALPA SALVO JWJ:[id number] DOB: 1945/08/30 DOA: 01/24/2024  PCP: Antonio Baumgarten, MD  Admit date: 01/24/2024  Discharge date: 01/26/2024  Admitted From:Home  Disposition:  Home  Recommendations for Outpatient Follow-up:  Follow up with PCP in 1-2 weeks Continue hemodialysis per routine schedule with last session performed on day of discharge 6/20 Continue Eliquis  low-dose as prescribed for atrial fibrillation and risk of stroke.  Started on lower dose given history of GI bleed in 2023. Discontinue aspirin  use Continue other medications as below  Home Health: None  Equipment/Devices: Chronic 3 L nasal cannula, Hoyer lift and nonambulatory at baseline  Discharge Condition:Stable  CODE STATUS: Full  Diet recommendation: Heart Healthy  Brief/Interim Summary: Cassandra Joseph is a 78 y.o. female with medical history significant of Brenner tumor s/p TAH/BSO (2010), ESRD on HD, HTN, HLD, CVA, multiple myeloma, and RA who presented on 6/18 with respiratory distress. She did not feel well on Monday post-HD.  She felt worse that night, weak and tired.  She became SOB on Tuesday AM and this has worsened since.  +productive cough with yellow/brown/clear phlegm.  The daughter who lives with her is also sick with a cold.  No fever.  No h/o afib.  No longer smokes.  Wears 3L home O2 during the day and CPAP at night.  She is non-ambulatory at baseline, uses a Hoyer lift to move from bed to chair and back.  Patient was admitted for hyperkalemia in the setting of ESRD/HD and has received hemodialysis and continues to have some elevated troponin levels, but without any complaints of chest pain or significant findings on 2D echocardiogram.  Her elevations are with flat trend and likely related to her renal dysfunction.  Additionally, she was noted to have some initial atrial fibrillation and is now in sinus rhythm and for this she will be started on low-dose Eliquis  for  anticoagulation.  No other acute events or concerns noted throughout the course of this admission and she has received hemodialysis prior to discharge.  Discharge Diagnoses:  Principal Problem:   Hypervolemia associated with renal insufficiency Active Problems:   Multiple myeloma (HCC)   Essential hypertension   ESRD on hemodialysis (HCC)   PAF (paroxysmal atrial fibrillation) (HCC)   OSA on CPAP   HLD (hyperlipidemia)   Rheumatoid arthritis with positive rheumatoid factor (HCC)   Chronic respiratory failure with hypoxia (HCC)   Hypervolemia  Principal discharge diagnosis: Hypervolemia in the setting of ESRD on HD along with findings of atrial fibrillation.  Elevated troponin related to renal dysfunction.  Discharge Instructions  Discharge Instructions     Diet - low sodium heart healthy   Complete by: As directed    Increase activity slowly   Complete by: As directed       Allergies as of 01/26/2024       Reactions   Shellfish Allergy Shortness Of Breath   Shellfish Allergy Other (See Comments)   Unknown reaction        Medication List     STOP taking these medications    Aspirin  Low Dose 81 MG tablet Generic drug: aspirin  EC       TAKE these medications    ALPRAZolam  0.25 MG tablet Commonly known as: XANAX  Take 0.25 mg by mouth every 8 (eight) hours as needed.   apixaban  2.5 MG Tabs tablet Commonly known as: ELIQUIS  Take 1 tablet (2.5 mg total) by mouth 2 (two) times daily.   atorvastatin  80 MG tablet Commonly  known as: LIPITOR  Take 1 tablet (80 mg total) by mouth daily.   b complex-vitamin c-folic acid  0.8 MG Tabs tablet Take 1 tablet by mouth daily.   cinacalcet  60 MG tablet Commonly known as: SENSIPAR  Take 60 mg by mouth 2 (two) times daily.   citalopram 10 MG tablet Commonly known as: CELEXA Take 10 mg by mouth daily.   docusate sodium  100 MG capsule Commonly known as: COLACE Take 100 mg by mouth 2 (two) times daily.   epoetin  alfa  4000 UNIT/ML injection Commonly known as: EPOGEN  Inject 1 mL (4,000 Units total) into the vein Every Tuesday,Thursday,and Saturday with dialysis. What changed: when to take this   feeding supplement (NEPRO CARB STEADY) Liqd Take 237 mLs by mouth 3 (three) times daily between meals.   fluticasone  50 MCG/ACT nasal spray Commonly known as: FLONASE  Place 1-2 sprays into both nostrils daily at 12 noon.   gabapentin  300 MG capsule Commonly known as: NEURONTIN  Take 300 mg by mouth 3 (three) times daily.   guaiFENesin  600 MG 12 hr tablet Commonly known as: Mucinex  Take 1 tablet (600 mg total) by mouth 2 (two) times daily.   Hydrocortisone  (Perianal) 1 % Crea Apply topically.   hydroxychloroquine  200 MG tablet Commonly known as: PLAQUENIL  Take 200 mg by mouth daily.   midodrine  10 MG tablet Commonly known as: PROAMATINE  Take 1 tablet (10 mg total) by mouth 3 (three) times daily with meals.   polyethylene glycol 17 g packet Commonly known as: MIRALAX  / GLYCOLAX  Take 17 g by mouth 2 (two) times daily.   senna 8.6 MG tablet Commonly known as: SENOKOT Take 2 tablets by mouth at bedtime.   sevelamer  carbonate 800 MG tablet Commonly known as: RENVELA  Take 1,600-2,400 mg by mouth 3 (three) times daily. Take 3 tabs with meals and 2 tabs with snacks   sodium chloride  0.65 % Soln nasal spray Commonly known as: OCEAN Place 1 spray into both nostrils as needed for congestion.        Follow-up Information     Antonio Baumgarten, MD. Schedule an appointment as soon as possible for a visit in 1 week(s).   Specialty: Internal Medicine Contact information: [address] [place] [address] [phone]                Allergies  Allergen Reactions   Shellfish Allergy Shortness Of Breath   Shellfish Allergy Other (See Comments)    Unknown reaction    Consultations: Nephrology   Procedures/Studies: ECHOCARDIOGRAM COMPLETE Result Date:  01/25/2024    ECHOCARDIOGRAM REPORT   Patient Name:   Cassandra Joseph Date of Exam: 01/25/2024 Medical Rec #:  [id number]        Height:       66.0 in Accession #:    [id number]       Weight:       245.8 lb Date of Birth:  07-16-1946        BSA:          2.183 m Patient Age:    78 years         BP:           132/64 mmHg Patient Gender: F                HR:           107 bpm. Exam Location:  [facility] Procedure: 2D Echo, Cardiac Doppler and Color Doppler (Both Spectral and Color  Flow Doppler were utilized during procedure). Indications:     Atrial Fibrillation I48.91  History:         Patient has prior history of Echocardiogram examinations, most                  recent 04/28/2021. CHF, ESRD, Arrythmias:Atrial Fibrillation,                  Signs/Symptoms:Hypotension; Risk Factors:Hypertension, Sleep                  Apnea and Dyslipidemia.  Sonographer:     Terrilee Few RCS Referring Phys:  [id number] Hartley Line D [facility] Diagnosing Phys: Belva Boyden MD IMPRESSIONS  1. Left ventricular ejection fraction, by estimation, is 60 to 65%. The left ventricle has normal function. The left ventricle has no regional wall motion abnormalities. Left ventricular diastolic parameters are indeterminate.  2. Right ventricular systolic function is normal. The right ventricular size is normal. There is mildly elevated pulmonary artery systolic pressure. The estimated right ventricular systolic pressure is 39.6 mmHg.  3. Left atrial size was moderately dilated.  4. The mitral valve is normal in structure. Mild to moderate mitral valve regurgitation. Mild mitral stenosis. The mean mitral valve gradient is 4.5 mmHg.  5. Tricuspid valve regurgitation is mild to moderate.  6. The aortic valve is normal in structure. Aortic valve regurgitation is not visualized. Aortic valve sclerosis is present, with no evidence of aortic valve stenosis.  7. The inferior vena cava is normal in size with greater than 50% respiratory variability,  suggesting right atrial pressure of 3 mmHg. FINDINGS  Left Ventricle: Left ventricular ejection fraction, by estimation, is 60 to 65%. The left ventricle has normal function. The left ventricle has no regional wall motion abnormalities. Strain was performed and the global longitudinal strain is indeterminate. The left ventricular internal cavity size was normal in size. There is no left ventricular hypertrophy. Left ventricular diastolic parameters are indeterminate. Right Ventricle: The right ventricular size is normal. No increase in right ventricular wall thickness. Right ventricular systolic function is normal. There is mildly elevated pulmonary artery systolic pressure. The tricuspid regurgitant velocity is 2.94  m/s, and with an assumed right atrial pressure of 5 mmHg, the estimated right ventricular systolic pressure is 39.6 mmHg. Left Atrium: Left atrial size was moderately dilated. Right Atrium: Right atrial size was normal in size. Pericardium: There is no evidence of pericardial effusion. Mitral Valve: The mitral valve is normal in structure. Mild mitral annular calcification. Mild to moderate mitral valve regurgitation. Mild mitral valve stenosis. MV peak gradient, 10.2 mmHg. The mean mitral valve gradient is 4.5 mmHg. Tricuspid Valve: The tricuspid valve is normal in structure. Tricuspid valve regurgitation is mild to moderate. No evidence of tricuspid stenosis. Aortic Valve: The aortic valve is normal in structure. Aortic valve regurgitation is not visualized. Aortic valve sclerosis is present, with no evidence of aortic valve stenosis. Aortic valve peak gradient measures 10.4 mmHg. Pulmonic Valve: The pulmonic valve was normal in structure. Pulmonic valve regurgitation is not visualized. No evidence of pulmonic stenosis. Aorta: The aortic root is normal in size and structure. Venous: The inferior vena cava is normal in size with greater than 50% respiratory variability, suggesting right atrial  pressure of 3 mmHg. IAS/Shunts: No atrial level shunt detected by color flow Doppler. Additional Comments: 3D was performed not requiring image post processing on an independent workstation and was indeterminate.  LEFT VENTRICLE PLAX 2D LVIDd:  4.46 cm   Diastology LVIDs:         2.80 cm   LV e' medial:    5.69 cm/s LV PW:         1.16 cm   LV E/e' medial:  24.8 LV IVS:        1.10 cm   LV e' lateral:   6.07 cm/s LVOT diam:     2.20 cm   LV E/e' lateral: 23.2 LV SV:         52 LV SV Index:   24 LVOT Area:     3.80 cm  RIGHT VENTRICLE            IVC RV S prime:     8.40 cm/s  IVC diam: 1.91 cm TAPSE (M-mode): 1.6 cm LEFT ATRIUM            Index        RIGHT ATRIUM           Index LA diam:      6.00 cm  2.75 cm/m   RA Area:     21.80 cm LA Vol (A2C): 135.5 ml 62.07 ml/m  RA Volume:   64.20 ml  29.41 ml/m LA Vol (A4C): 109.0 ml 49.93 ml/m  AORTIC VALVE AV Area (Vmax): 2.57 cm AV Vmax:        161.00 cm/s AV Peak Grad:   10.4 mmHg LVOT Vmax:      109.00 cm/s LVOT Vmean:     64.500 cm/s LVOT VTI:       0.136 m  AORTA Ao Root diam: 3.30 cm Ao Arch diam: 3.1 cm MITRAL VALVE                TRICUSPID VALVE MV Area (PHT): 3.17 cm     TR Peak grad:   34.6 mmHg MV Area VTI:   1.72 cm     TR Vmax:        294.00 cm/s MV Peak grad:  10.2 mmHg MV Mean grad:  4.5 mmHg     SHUNTS MV Vmax:       1.60 m/s     Systemic VTI:  0.14 m MV Vmean:      96.6 cm/s    Systemic Diam: 2.20 cm MV Decel Time: 239 msec MV E velocity: 141.00 cm/s MV A velocity: 80.10 cm/s MV E/A ratio:  1.76 Belva Boyden MD Electronically signed by Belva Boyden MD Signature Date/Time: 01/25/2024/4:37:52 PM    Final    DG Chest Port 1 View Result Date: 01/24/2024 CLINICAL DATA:  Shortness of breath. EXAM: PORTABLE CHEST 1 VIEW COMPARISON:  02/13/2022. FINDINGS: Patient is rotated to the right with associated shift of the cardiomediastinal silhouette. Cardiomegaly, similar to the prior exam. Aortic atherosclerosis. Streaky bibasilar interstitial  opacities could reflect atelectasis or edema. No pneumothorax. No acute osseous abnormality. IMPRESSION: Cardiomegaly with streaky bibasilar interstitial opacities, which could reflect atelectasis or edema. Electronically Signed   By: Mannie Seek M.D.   On: 01/24/2024 12:37     Discharge Exam: Vitals:   01/26/24 0519 01/26/24 0745  BP: 103/87 113/72  Pulse: 87 99  Resp: 18 18  Temp: 99 F (37.2 C) 98.5 F (36.9 C)  SpO2: 100% 100%   Vitals:   01/26/24 0022 01/26/24 0500 01/26/24 0519 01/26/24 0745  BP: 113/74  103/87 113/72  Pulse: 98  87 99  Resp: 18  18 18   Temp: 97.6 F (36.4 C)  99 F (37.2 C) 98.5  F (36.9 C)  TempSrc:      SpO2: 98%  100% 100%  Weight:  109.9 kg    Height:        General: Pt is alert, awake, not in acute distress, morbidly obese Cardiovascular: RRR, S1/S2 +, no rubs, no gallops Respiratory: CTA bilaterally, no wheezing, no rhonchi, 3 L nasal cannula Abdominal: Soft, NT, ND, bowel sounds + Extremities: no edema, no cyanosis    The results of significant diagnostics from this hospitalization (including imaging, microbiology, ancillary and laboratory) are listed below for reference.     Microbiology: No results found for this or any previous visit (from the past 240 hours).   Labs: BNP (last 3 results) No results for input(s): BNP in the last 8760 hours. Basic Metabolic Panel: Recent Labs  Lab 01/24/24 1007 01/25/24 0448 01/26/24 0109  NA 137 132* 132*  K 4.9 4.2 4.1  CL 94* 92* 93*  CO2 24 24 27   GLUCOSE 110* 93 89  BUN 38* 26* 41*  CREATININE 7.51* 5.27* 6.70*  CALCIUM  9.0 9.5 8.3*  MG  --   --  1.9   Liver Function Tests: Recent Labs  Lab 01/24/24 1007  AST 26  ALT 15  ALKPHOS 59  BILITOT 1.2  PROT 8.7*  ALBUMIN 3.6   No results for input(s): LIPASE, AMYLASE in the last 168 hours. No results for input(s): AMMONIA in the last 168 hours. CBC: Recent Labs  Lab 01/24/24 1007 01/25/24 0448 01/26/24 0109   WBC 6.7 4.5 4.6  NEUTROABS 5.0  --   --   HGB 10.7* 10.8* 10.4*  HCT 33.0* 32.9* 31.0*  MCV 94.3 95.1 93.1  PLT 143* 130* 124*   Cardiac Enzymes: No results for input(s): CKTOTAL, CKMB, CKMBINDEX, TROPONINI in the last 168 hours. BNP: Invalid input(s): POCBNP CBG: No results for input(s): GLUCAP in the last 168 hours. D-Dimer No results for input(s): DDIMER in the last 72 hours. Hgb A1c No results for input(s): HGBA1C in the last 72 hours. Lipid Profile No results for input(s): CHOL, HDL, LDLCALC, TRIG, CHOLHDL, LDLDIRECT in the last 72 hours. Thyroid function studies No results for input(s): TSH, T4TOTAL, T3FREE, THYROIDAB in the last 72 hours.  Invalid input(s): FREET3 Anemia work up No results for input(s): VITAMINB12, FOLATE, FERRITIN, TIBC, IRON, RETICCTPCT in the last 72 hours. Urinalysis No results found for: COLORURINE, APPEARANCEUR, LABSPEC, PHURINE, GLUCOSEU, HGBUR, BILIRUBINUR, KETONESUR, PROTEINUR, UROBILINOGEN, NITRITE, LEUKOCYTESUR Sepsis Labs Recent Labs  Lab 01/24/24 1007 01/25/24 0448 01/26/24 0109  WBC 6.7 4.5 4.6   Microbiology No results found for this or any previous visit (from the past 240 hours).   Time coordinating discharge: 35 minutes  SIGNED:   Cornelius Dill, DO [facility] 01/26/2024, 10:20 AM  If 7PM-7AM, please contact night-coverage [email or web address]

## 2024-01-27 LAB — PHOSPHORUS: Phosphorus: 3.6 mg/dL (ref 2.5–4.6)

## 2024-01-27 MED ORDER — SODIUM CHLORIDE 0.9 % IV BOLUS
250.0000 mL | Freq: Once | INTRAVENOUS | Status: AC
  Administered 2024-01-27: 250 mL via INTRAVENOUS

## 2024-01-27 NOTE — TOC Transition Note (Signed)
 Transition of Care [facility]) - Discharge Note   Patient Details  Name: Cassandra Joseph MRN: [id number] Date of Birth: 1945-11-23  Transition of Care [facility]) CM/SW Contact:  Lakota Markgraf E Christabelle Hanzlik, LCSW Phone Number: 01/27/2024, 2:14 PM   Clinical Narrative:    Spoke to Sharlet (daughter). Pam confirms she is home and patient can return but wants to wait for her daughter Georgiana - [phone]) to get home from work as Sharlet is also sick and does not want to expose the patient. CSW spoke with Adorable, she will be home after 5:30. Pam confirms patient already has home o2, denies additional TOC needs. Home address confirmed. [facility] transport arranged with Octaviano, he is aware to pick up at 5:30 or later.     Final next level of care: Home w Home Health Services Barriers to Discharge: Barriers Resolved   Patient Goals and CMS Choice   CMS Medicare.gov Compare Post Acute Care list provided to:: Patient Represenative (must comment) Choice offered to / list presented to : Adult Children      Discharge Placement                Patient to be transferred to facility by: [facility] Name of family member notified: Sharlet Cable Patient and family notified of of transfer: 01/27/24  Discharge Plan and Services Additional resources added to the After Visit Summary for                                       Social Drivers of Health (SDOH) Interventions SDOH Screenings   Food Insecurity: No Food Insecurity (01/25/2024)  Housing: Low Risk  (01/25/2024)  Transportation Needs: No Transportation Needs (01/25/2024)  Utilities: Not At Risk (01/25/2024)  Financial Resource Strain: Medium Risk (03/17/2022)   Received from [facility]  Physical Activity: Inactive (03/17/2022)   [facility]  Social Connections: Socially Integrated (01/25/2024)  Stress: Stress Concern Present (03/17/2022)   Received from [facility]  Tobacco  Use: Medium Risk (01/24/2024)     Readmission Risk Interventions    02/15/2022    4:15 PM 05/21/2021   12:18 PM  Readmission Risk Prevention Plan  Transportation Screening Complete Complete  HRI or Home Care Consult Complete   Social Work Consult for Recovery Care Planning/Counseling Complete   Palliative Care Screening Not Applicable   Medication Review Oceanographer) Complete Complete  PCP or Specialist appointment within 3-5 days of discharge  Complete  HRI or Home Care Consult  Complete  SW Recovery Care/Counseling Consult  Complete  Palliative Care Screening  Not Applicable  Skilled Nursing Facility  Not Applicable

## 2024-01-27 NOTE — TOC Progression Note (Addendum)
 Transition of Care [facility]) - Progression Note    Patient Details  Name: Cassandra Joseph MRN: [id number] Date of Birth: 1945-09-06  Transition [facility]) CM/SW Contact  Joretta Eads E Ruqayyah Lute, LCSW Phone Number: 01/27/2024, 10:59 AM  Clinical Narrative:    Patient is ready to DC. Notified by RN that patient is bedbound and will need EMS transport. CSW attempted call to daughter Sharlet, left a VM requesting a return call. CSW attempted call to daughter Mae, left a VM requesting a return call. CSW called daughter Apolinar. Apolinar confirmed the home address and that they need EMS. However Apolinar states she lives out of town and cannot assist today. She states Pam lives with patient and we will need to confirm someone is home before arranging EMS. Asked Apolinar to try to reach [facility] and have her call this CSW back.   12:45- Attempted calls to [facility].  1:58- Attempted call to Pam, left another VM. Attempted call to [name], left another VM.   Expected Discharge Plan: Home/Self Care (Resumption of Outpatient Hemodialysis) Barriers to Discharge: No Barriers Identified  Expected Discharge Plan and Services         Expected Discharge Date: 01/26/24                                     Social Determinants of Health (SDOH) Interventions SDOH Screenings   Food Insecurity: No Food Insecurity (01/25/2024)  Housing: Low Risk  (01/25/2024)  Transportation Needs: No Transportation Needs (01/25/2024)  Utilities: Not At Risk (01/25/2024)  Financial Resource Strain: Medium Risk (03/17/2022)   Received from [facility]  Physical Activity: Inactive (03/17/2022)   Received from [facility]  Social Connections: Socially Integrated (01/25/2024)  Stress: Stress Concern Present (03/17/2022)   Received from [facility]  Tobacco Use: Medium Risk (01/24/2024)    Readmission Risk Interventions    02/15/2022    4:15 PM 05/21/2021   12:18 PM  Readmission  Risk Prevention Plan  Transportation Screening Complete Complete  HRI or Home Care Consult Complete   Social Work Consult for Recovery Care Planning/Counseling Complete   Palliative Care Screening Not Applicable   Medication Review Oceanographer) Complete Complete  PCP or Specialist appointment within 3-5 days of discharge  Complete  HRI or Home Care Consult  Complete  SW Recovery Care/Counseling Consult  Complete  Palliative Care Screening  Not Applicable  Skilled Nursing Facility  Not Applicable

## 2024-01-27 NOTE — Progress Notes (Signed)
 Patient seen and evaluated this morning and had hemodialysis late into the night and early into this morning.  She became somewhat hypotensive during the dialysis session and required IV fluid bolus.  She remains slightly hypotensive this morning, but otherwise was asymptomatic and has responded well to 250 mL fluid bolus.  She is now in stable condition for discharge and may continue hemodialysis outpatient as scheduled.  No other acute events or concerns noted.  Please refer to discharge summary dictated 6/20 for full details.  Total care time: 25 minutes.

## 2024-01-27 NOTE — Progress Notes (Signed)
 Received patient in bed .  Alert and oriented. x4 Informed consent signed and in chart.   TX duration:3 hours  Patient hypotensive during tx. Bolus of 300cc in total was given to pt.  Transported back to the room  Alert, without acute distress.  Hand-off given to patient's nurse.   Access used: AVF Access issues: none  Total UF removed: 1400cc Medication(s) given: epoetin  alfa 4000 units Post HD VS: see table below Post HD weight: 106.5kg   01/27/24 0104  Vitals  Temp 98 F (36.7 C)  Temp Source Oral  BP (!) 96/53  MAP (mmHg) (!) 59  BP Location Right Arm  BP Method Automatic  Patient Position (if appropriate) Lying  Pulse Rate Source Monitor  ECG Heart Rate 97  Resp 12  Oxygen Therapy  SpO2 97 %  O2 Device Nasal Cannula  O2 Flow Rate (L/min) 4 L/min  Patient Activity (if Appropriate) In bed  Pulse Oximetry Type Continuous  During Treatment Monitoring  Blood Flow Rate (mL/min) 0 mL/min  Arterial Pressure (mmHg) -3.23 mmHg  Venous Pressure (mmHg) -2.02 mmHg  TMP (mmHg) -49.69 mmHg  Ultrafiltration Rate (mL/min) 1198 mL/min  Dialysate Flow Rate (mL/min) 0 ml/min  Duration of HD Treatment -hour(s) 3 hour(s)  Cumulative Fluid Removed (mL) per Treatment  1400.25  HD Safety Checks Performed Yes  Intra-Hemodialysis Comments Tolerated well  Post Treatment  Dialyzer Clearance Clear  Hemodialysis Intake (mL) 300 mL  Liters Processed 72  Fluid Removed (mL) 1400 mL  Tolerated HD Treatment Yes  Post-Hemodialysis Comments goal not met  AVG/AVF Arterial Site Held (minutes) 7 minutes  AVG/AVF Venous Site Held (minutes) 5 minutes  Fistula / Graft Right Forearm Arteriovenous fistula  Placement Date/Time: 04/29/21 0237   Placed prior to admission: Yes  Orientation: Right  Access Location: Forearm  Access Type: Arteriovenous fistula  Site Condition No complications  Fistula / Graft Assessment Present;Thrill;Bruit  Status Deaccessed;Flushed;Patent      Lorrene KANDICE [facility]

## 2024-01-27 NOTE — Plan of Care (Signed)

## 2024-01-27 NOTE — Progress Notes (Signed)
 [facility]  ROUNDING NOTE   Subjective:  Patient seen din bed resting. Had dialysis yesterday with mild hypotension. Denies cramping, denies dizziness. Patient endorses poor appetite and concern for lack of BM multiple days. No other issues.    Objective:  Vital signs in last 24 hours:  Temp:  [97.6 F (36.4 C)-98.6 F (37 C)] 97.8 F (36.6 C) (06/21 0739) Pulse Rate:  [89-99] 99 (06/21 0739) Resp:  [10-25] 16 (06/21 0739) BP: (70-115)/(42-64) 88/62 (06/21 0739) SpO2:  [96 %-100 %] 97 % (06/21 0739) Weight:  [106.5 kg-108.2 kg] 106.5 kg (06/21 0103)  Weight change: -1.7 kg Filed Weights   01/26/24 0500 01/26/24 2112 01/27/24 0103  Weight: 109.9 kg 108.2 kg 106.5 kg    Intake/Output: I/O last 3 completed shifts: In: 240 [P.O.:240] Out: 1400 [Other:1400]   Intake/Output this shift:  No intake/output data recorded.  Physical Exam: General: NAD,   Head: Normocephalic, atraumatic. Moist oral mucosal membranes  Eyes: Anicteric, PERRL  Neck: Supple, trachea midline  Lungs:  Diminished, on 2L [place]  Heart: Regular rate and rhythm  Abdomen:  Soft, nontender,   Extremities:  No peripheral edema.  Neurologic: Nonfocal, moving all four extremities  Skin: No lesions  Access: Right AVF    Basic Metabolic Panel: Recent Labs  Lab 01/24/24 1007 01/25/24 0448 01/26/24 0109  NA 137 132* 132*  K 4.9 4.2 4.1  CL 94* 92* 93*  CO2 24 24 27   GLUCOSE 110* 93 89  BUN 38* 26* 41*  CREATININE 7.51* 5.27* 6.70*  CALCIUM  9.0 9.5 8.3*  MG  --   --  1.9    Liver Function Tests: Recent Labs  Lab 01/24/24 1007  AST 26  ALT 15  ALKPHOS 59  BILITOT 1.2  PROT 8.7*  ALBUMIN 3.6   No results for input(s): LIPASE, AMYLASE in the last 168 hours. No results for input(s): AMMONIA in the last 168 hours.  CBC: Recent Labs  Lab 01/24/24 1007 01/25/24 0448 01/26/24 0109  WBC 6.7 4.5 4.6  NEUTROABS 5.0  --   --   HGB 10.7* 10.8* 10.4*  HCT 33.0* 32.9* 31.0*   MCV 94.3 95.1 93.1  PLT 143* 130* 124*    Cardiac Enzymes: No results for input(s): CKTOTAL, CKMB, CKMBINDEX, TROPONINI in the last 168 hours.  BNP: Invalid input(s): POCBNP  CBG: No results for input(s): GLUCAP in the last 168 hours.  Microbiology: Results for orders placed or performed during the hospital encounter of 02/10/22  MRSA Next Gen by PCR, Nasal     Status: None   Collection Time: 02/11/22  8:27 PM   Specimen: Nasal Mucosa; Nasal Swab  Result Value Ref Range Status   MRSA by PCR Next Gen NOT DETECTED NOT DETECTED Final    Comment: (NOTE) The GeneXpert MRSA Assay (FDA approved for NASAL specimens only), is one component of a comprehensive MRSA colonization surveillance program. It is not intended to diagnose MRSA infection nor to guide or monitor treatment for MRSA infections. Test performance is not FDA approved in patients less than 77 years old. Performed at [facility], [address] [place], [place] [address]     Coagulation Studies: No results for input(s): LABPROT, INR in the last 72 hours.  Urinalysis: No results for input(s): COLORURINE, LABSPEC, PHURINE, GLUCOSEU, HGBUR, BILIRUBINUR, KETONESUR, PROTEINUR, UROBILINOGEN, NITRITE, LEUKOCYTESUR in the last 72 hours.  Invalid input(s): APPERANCEUR    Imaging: ECHOCARDIOGRAM COMPLETE Result Date: 01/25/2024    ECHOCARDIOGRAM REPORT  Patient Name:   Cassandra Joseph Date of Exam: 01/25/2024 Medical Rec #:  [id number]        Height:       66.0 in Accession #:    [id number]       Weight:       245.8 lb Date of Birth:  07/16/1946        BSA:          2.183 m Patient Age:    78 years         BP:           132/64 mmHg Patient Gender: F                HR:           107 bpm. Exam Location:  [facility] Procedure: 2D Echo, Cardiac Doppler and Color Doppler (Both Spectral and Color            Flow Doppler were utilized during procedure). Indications:     Atrial Fibrillation  I48.91  History:         Patient has prior history of Echocardiogram examinations, most                  recent 04/28/2021. CHF, ESRD, Arrythmias:Atrial Fibrillation,                  Signs/Symptoms:Hypotension; Risk Factors:Hypertension, Sleep                  Apnea and Dyslipidemia.  Sonographer:     Thea Norlander RCS Referring Phys:  [id number] ADRON D [facility] Diagnosing Phys: Evalene Lunger MD IMPRESSIONS  1. Left ventricular ejection fraction, by estimation, is 60 to 65%. The left ventricle has normal function. The left ventricle has no regional wall motion abnormalities. Left ventricular diastolic parameters are indeterminate.  2. Right ventricular systolic function is normal. The right ventricular size is normal. There is mildly elevated pulmonary artery systolic pressure. The estimated right ventricular systolic pressure is 39.6 mmHg.  3. Left atrial size was moderately dilated.  4. The mitral valve is normal in structure. Mild to moderate mitral valve regurgitation. Mild mitral stenosis. The mean mitral valve gradient is 4.5 mmHg.  5. Tricuspid valve regurgitation is mild to moderate.  6. The aortic valve is normal in structure. Aortic valve regurgitation is not visualized. Aortic valve sclerosis is present, with no evidence of aortic valve stenosis.  7. The inferior vena cava is normal in size with greater than 50% respiratory variability, suggesting right atrial pressure of 3 mmHg. FINDINGS  Left Ventricle: Left ventricular ejection fraction, by estimation, is 60 to 65%. The left ventricle has normal function. The left ventricle has no regional wall motion abnormalities. Strain was performed and the global longitudinal strain is indeterminate. The left ventricular internal cavity size was normal in size. There is no left ventricular hypertrophy. Left ventricular diastolic parameters are indeterminate. Right Ventricle: The right ventricular size is normal. No increase in right ventricular wall thickness.  Right ventricular systolic function is normal. There is mildly elevated pulmonary artery systolic pressure. The tricuspid regurgitant velocity is 2.94  m/s, and with an assumed right atrial pressure of 5 mmHg, the estimated right ventricular systolic pressure is 39.6 mmHg. Left Atrium: Left atrial size was moderately dilated. Right Atrium: Right atrial size was normal in size. Pericardium: There is no evidence of pericardial effusion. Mitral Valve: The mitral valve is normal in structure. Mild mitral annular calcification. Mild to moderate mitral valve  regurgitation. Mild mitral valve stenosis. MV peak gradient, 10.2 mmHg. The mean mitral valve gradient is 4.5 mmHg. Tricuspid Valve: The tricuspid valve is normal in structure. Tricuspid valve regurgitation is mild to moderate. No evidence of tricuspid stenosis. Aortic Valve: The aortic valve is normal in structure. Aortic valve regurgitation is not visualized. Aortic valve sclerosis is present, with no evidence of aortic valve stenosis. Aortic valve peak gradient measures 10.4 mmHg. Pulmonic Valve: The pulmonic valve was normal in structure. Pulmonic valve regurgitation is not visualized. No evidence of pulmonic stenosis. Aorta: The aortic root is normal in size and structure. Venous: The inferior vena cava is normal in size with greater than 50% respiratory variability, suggesting right atrial pressure of 3 mmHg. IAS/Shunts: No atrial level shunt detected by color flow Doppler. Additional Comments: 3D was performed not requiring image post processing on an independent workstation and was indeterminate.  LEFT VENTRICLE PLAX 2D LVIDd:         4.46 cm   Diastology LVIDs:         2.80 cm   LV e' medial:    5.69 cm/s LV PW:         1.16 cm   LV E/e' medial:  24.8 LV IVS:        1.10 cm   LV e' lateral:   6.07 cm/s LVOT diam:     2.20 cm   LV E/e' lateral: 23.2 LV SV:         52 LV SV Index:   24 LVOT Area:     3.80 cm  RIGHT VENTRICLE            IVC RV S prime:     8.40  cm/s  IVC diam: 1.91 cm TAPSE (M-mode): 1.6 cm LEFT ATRIUM            Index        RIGHT ATRIUM           Index LA diam:      6.00 cm  2.75 cm/m   RA Area:     21.80 cm LA Vol (A2C): 135.5 ml 62.07 ml/m  RA Volume:   64.20 ml  29.41 ml/m LA Vol (A4C): 109.0 ml 49.93 ml/m  AORTIC VALVE AV Area (Vmax): 2.57 cm AV Vmax:        161.00 cm/s AV Peak Grad:   10.4 mmHg LVOT Vmax:      109.00 cm/s LVOT Vmean:     64.500 cm/s LVOT VTI:       0.136 m  AORTA Ao Root diam: 3.30 cm Ao Arch diam: 3.1 cm MITRAL VALVE                TRICUSPID VALVE MV Area (PHT): 3.17 cm     TR Peak grad:   34.6 mmHg MV Area VTI:   1.72 cm     TR Vmax:        294.00 cm/s MV Peak grad:  10.2 mmHg MV Mean grad:  4.5 mmHg     SHUNTS MV Vmax:       1.60 m/s     Systemic VTI:  0.14 m MV Vmean:      96.6 cm/s    Systemic Diam: 2.20 cm MV Decel Time: 239 msec MV E velocity: 141.00 cm/s MV A velocity: 80.10 cm/s MV E/A ratio:  1.76 Evalene Lunger MD Electronically signed by Evalene Lunger MD Signature Date/Time: 01/25/2024/4:37:52 PM    Final  Medications:    sodium chloride       apixaban   2.5 mg Oral BID   atorvastatin   80 mg Oral Daily   Chlorhexidine  Gluconate Cloth  6 each Topical Q0600   cinacalcet   60 mg Oral BID WC   citalopram   10 mg Oral Daily   docusate sodium   100 mg Oral BID   epoetin  alfa  4,000 Units Intravenous Q M,W,F-HD   feeding supplement (NEPRO CARB STEADY)  237 mL Oral TID BM   gabapentin   300 mg Oral TID   hydroxychloroquine   200 mg Oral Daily   multivitamin  1 tablet Oral QHS   polyethylene glycol  17 g Oral BID   senna  2 tablet Oral QHS   sevelamer  carbonate  2,400 mg Oral TID with meals   sodium chloride  flush  3 mL Intravenous Q12H   acetaminophen  **OR** acetaminophen , albuterol , ALPRAZolam , calcium  carbonate (dosed in mg elemental calcium ), camphor-menthol  **AND** hydrOXYzine , docusate sodium , feeding supplement (NEPRO CARB STEADY), hydrALAZINE , ondansetron  **OR** ondansetron  (ZOFRAN ) IV,  sevelamer  carbonate, sorbitol   Assessment/ Plan:  Cassandra Joseph is a 78 y.o.  female  with past medical history of Harrold tumor status post TAH/BSO, ESRD on HD MWF, hypertension, hyperlipidemia, CVA, multiple myeloma, rheumatoid arthritis, anemia of chronic disease, secondary hyperparathyroidism, history of CVA, COVID-19 pneumonia, history of septic arthritis who presented with volume overload.   [facility]/[address]/MWF/RUE AVF   1.  ESRD on HD MWF.  Patient had dialysis yesterday with 1.5L UF goal. Noted hypotension  No indication for dialysis. Plan for next dialysis as scheduled for Monday.   2.  Acute respiratory failure.  Patient was significantly volume overloaded upon admission.  Continue ultrafiltration with dialysis to aid with her respiratory status. Continues on 2L [place]   3.  Anemia of chronic kidney disease.  Recent Labs       Lab Results  Component Value Date    HGB 10.4 (L) 01/26/2024    Hemoglobin currently at target.  Maintain the patient on Epogen  4000's IV with dialysis.   4.  Secondary hyperparathyroidism.  Maintain the patient on Renvela  2400 mg 3 times daily with meals and monitor bone metabolism parameters over the course of the hospitalization.  Phosphorus lab pending  01/26/2024 Ca 8.3   LOS: 2 Marylen Zuk P Levorn 6/21/20258:22 AM

## 2024-02-06 ENCOUNTER — Encounter: Payer: Self-pay | Admitting: Internal Medicine

## 2024-02-12 ENCOUNTER — Encounter: Payer: Self-pay | Admitting: Internal Medicine

## 2024-04-11 ENCOUNTER — Encounter: Payer: Self-pay | Admitting: Internal Medicine

## 2024-04-23 ENCOUNTER — Encounter: Payer: Self-pay | Admitting: Cardiology

## 2024-04-23 ENCOUNTER — Ambulatory Visit: Attending: Cardiology | Admitting: Cardiology

## 2024-04-23 VITALS — BP 105/71 | HR 66 | Ht 66.0 in | Wt 260.0 lb

## 2024-04-23 DIAGNOSIS — I4821 Permanent atrial fibrillation: Secondary | ICD-10-CM | POA: Insufficient documentation

## 2024-04-23 DIAGNOSIS — I2510 Atherosclerotic heart disease of native coronary artery without angina pectoris: Secondary | ICD-10-CM | POA: Diagnosis not present

## 2024-04-23 DIAGNOSIS — I340 Nonrheumatic mitral (valve) insufficiency: Secondary | ICD-10-CM | POA: Diagnosis not present

## 2024-04-23 NOTE — Progress Notes (Signed)
 Cardiology Office Note:    Date:  04/23/2024   ID:  Cassandra Joseph, DOB 1945-11-12, MRN [id number]  PCP:  Sadie Manna, MD   [facility] Providers Cardiologist:  None     Referring MD: Tonnie Jon SAILOR, NP   Chief Complaint  Patient presents with   Establish Care    New  pt has been doing well with no complaints of chest pain, chest pressure or SOB, medciation reviewed verbally with patient    History of Present Illness:    Cassandra Joseph is a 78 y.o. female with a hx of CAD (LAD, LCx, RCA calcifications on chest CT ), hypertension, permanent atrial fibrillation, ESRD on HD MWF, ischemic stroke 03/2014, multiple myeloma currently in remission, anemia of chronic disease, obesity, nonambulatory at baseline, on home oxygen, CPAP nightly, rheumatoid arthritis presenting to establish care.  Has a history of atrial fibrillation as far back as 2021.  On Eliquis  2.5 mg twice daily.  Has anemia of chronic disease.  Has occasional chest pain relieved with sublingual nitroglycerin .  Last episode of chest discomfort was about a month ago.  Patient is nonambulatory, needs help with activities, movements.  Endorses joint pain, knees, elbows, fingers, difficulty opening objects.  BP usually runs low on dialysis days, takes midodrine   Echocardiogram 01/26/2024 EF 60 to 65% mild to moderate MR, mild mitral stenosis, severely dilated LA  Past Medical History:  Diagnosis Date   Harrold tumor    a. s/p TAH/BSO 08/2008   CHF (congestive heart failure) (HCC)    Effusion of left hip 09/28/2020   ESRD (end stage renal disease) (HCC)    a. on HD x 11 years; b. felt to be 2/2 HTN; c. HD TTS   History of anemia due to chronic kidney disease    History of nuclear stress test    a. 08/2015: probably normal myocardial perfusion study, no evidence for significant ischemia or scar was noted, during stress, global systolic function normal, EF 54%, RV borderline dilatation, coronary artery  calcifications and marked mitral annular calcifications were noted, liver calcifications were noted on attenuation CT scan likely represent sequelae of previous granulomatous disease   HLD (hyperlipidemia)    Hypertension    Ischemic stroke (HCC)    a. 03/2014   Left leg pain    Metabolic syndrome    Morbid obesity (HCC)    Multiple myeloma (HCC)    a. 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 2014   Viral pneumonia 08/08/2020    Past Surgical History:  Procedure Laterality Date   ABDOMINAL HYSTERECTOMY     APPENDECTOMY     eyelid surgery     HERNIA REPAIR     right knee surgery      Current Medications: Current Meds  Medication Sig   ALPRAZolam  (XANAX ) 0.25 MG tablet Take 0.25 mg by mouth every 8 (eight) hours as needed.   apixaban  (ELIQUIS ) 2.5 MG TABS tablet Take 1 tablet (2.5 mg total) by mouth 2 (two) times daily.   atorvastatin  (LIPITOR ) 80 MG tablet Take 1 tablet (80 mg total) by mouth daily.   b complex-vitamin c-folic acid  (NEPHRO-VITE) 0.8 MG TABS tablet Take 1 tablet by mouth daily.   cinacalcet  (SENSIPAR ) 60  MG tablet Take 60 mg by mouth 2 (two) times daily.   citalopram  (CELEXA ) 10 MG tablet Take 10 mg by mouth daily.   docusate sodium  (COLACE) 100 MG capsule Take 100 mg by mouth 2 (two) times daily.   epoetin  alfa (EPOGEN ) 4000 UNIT/ML injection Inject 1 mL (4,000 Units total) into the vein Every Tuesday,Thursday,and Saturday with dialysis. (Patient taking differently: Inject 4,000 Units into the vein every Monday, Wednesday, and Friday with hemodialysis.)   fluticasone  (FLONASE ) 50 MCG/ACT nasal spray Place 1-2 sprays into both nostrils daily at 12 noon.   gabapentin  (NEURONTIN ) 300 MG capsule Take 300 mg by mouth 3 (three) times daily.   guaiFENesin  (MUCINEX ) 600  MG 12 hr tablet Take 1 tablet (600 mg total) by mouth 2 (two) times daily.   Hydrocortisone , Perianal, 1 % CREA Apply topically.   hydroxychloroquine  (PLAQUENIL ) 200 MG tablet Take 200 mg by mouth daily.   midodrine  (PROAMATINE ) 10 MG tablet Take 1 tablet (10 mg total) by mouth 3 (three) times daily with meals.   Nutritional Supplements (FEEDING SUPPLEMENT, NEPRO CARB STEADY,) LIQD Take 237 mLs by mouth 3 (three) times daily between meals.   polyethylene glycol (MIRALAX  / GLYCOLAX ) 17 g packet Take 17 g by mouth 2 (two) times daily.   senna (SENOKOT) 8.6 MG tablet Take 2 tablets by mouth at bedtime.   sevelamer  carbonate (RENVELA ) 800 MG tablet Take 1,600-2,400 mg by mouth 3 (three) times daily. Take 3 tabs with meals and 2 tabs with snacks   sodium chloride  (OCEAN) 0.65 % SOLN nasal spray Place 1 spray into both nostrils as needed for congestion.     Allergies:   Shellfish allergy and Shellfish allergy   Social History   Socioeconomic History   Marital status: Unknown    Spouse name: Not on file   Number of children: Not on file   Years of education: Not on file   Highest education level: Not on file  Occupational History   Not on file  Tobacco Use   Smoking status: Former    Types: Cigarettes   Smokeless tobacco: Never   Tobacco comments:    Quit in 2007  Substance and Sexual Activity   Alcohol  use: No   Drug use: No   Sexual activity: Not Currently  Other Topics Concern   Not on file  Social History Narrative   ** Merged History Encounter **       Independent at baseline, ambulates with a walker   Social Drivers of Health   Financial Resource Strain: Medium Risk (03/17/2022)   Received from [facility]   Overall Financial Resource Strain (CARDIA)    Difficulty of Paying Living Expenses: Somewhat hard  Food Insecurity: No Food Insecurity (01/25/2024)   Hunger Vital Sign    Worried About Running Out of Food in the Last Year: Never true    Ran Out of  Food in the Last Year: Never true  Transportation Needs: No Transportation Needs (01/25/2024)   PRAPARE - Administrator, Civil Service (Medical): No    Lack of Transportation (Non-Medical): No  Physical Activity: Inactive (03/17/2022)   Received from [facility]   Exercise Vital Sign    On average, how many days per week do you engage in moderate to strenuous exercise (like a brisk walk)?: 0 days    On average, how many minutes do you engage in exercise at this level?: 0 min  Stress: Stress Concern Present (03/17/2022)  Received from [facility]   [employer] of Occupational Health - Occupational Stress Questionnaire    Feeling of Stress : Very much  Social Connections: Socially Integrated (01/25/2024)   Social Connection and Isolation Panel    Frequency of Communication with Friends and Family: More than three times a week    Frequency of Social Gatherings with Friends and Family: Not on file    Attends Religious Services: 1 to 4 times per year    Active Member of [employer] or Organizations: Yes    Attends Banker Meetings: 1 to 4 times per year    Marital Status: Married     Family History: The patient's family history includes Hypertension in her mother; Seizures in her mother; Throat cancer in her father. There is no history of Breast cancer.  ROS:   Please see the history of present illness.     All other systems reviewed and are negative.  EKGs/Labs/Other Studies Reviewed:    The following studies were reviewed today:  EKG Interpretation Date/Time:  Tuesday April 23 2024 15:14:51 EDT Ventricular Rate:  66 PR Interval:    QRS Duration:  92 QT Interval:  450 QTC Calculation: 471 R Axis:   -4  Text Interpretation: Atrial fibrillation Confirmed by Darliss Rogue ([id number]) on 04/23/2024 3:21:59 PM    Recent Labs: 01/24/2024: ALT 15 01/26/2024: BUN 41; Creatinine, Ser 6.70; Hemoglobin 10.4; Magnesium 1.9;  Platelets 124; Potassium 4.1; Sodium 132  Recent Lipid Panel    Component Value Date/Time   CHOL 211 (H) 11/09/2016 1503   TRIG 81 11/09/2016 1503   HDL 49 11/09/2016 1503   CHOLHDL 4.3 11/09/2016 1503   VLDL 16 11/09/2016 1503   LDLCALC 146 (H) 11/09/2016 1503     Risk Assessment/Calculations:             Physical Exam:    VS:  BP 105/71 (BP Location: Left Arm, Patient Position: Sitting, Cuff Size: Normal)   Pulse 66   Ht 5' 6 (1.676 m)   Wt 260 lb (117.9 kg)   SpO2 93%   BMI 41.97 kg/m     Wt Readings from Last 3 Encounters:  04/23/24 260 lb (117.9 kg)  01/27/24 234 lb 12.6 oz (106.5 kg)  02/21/22 255 lb 11.7 oz (116 kg)     GEN:  Well nourished, well developed in no acute distress HEENT: Normal NECK: No JVD; No carotid bruits CARDIAC: RRR, no murmur RESPIRATORY:  Clear to auscultation without rales, wheezing or rhonchi  ABDOMEN: Soft, non-tender, non-distended MUSCULOSKELETAL:  No edema; slight ulnar deviation SKIN: Warm and dry NEUROLOGIC:  Alert and oriented x 3 PSYCHIATRIC:  Normal affect   ASSESSMENT:    1. Coronary artery disease involving native coronary artery of native heart, unspecified whether angina present   2. Permanent atrial fibrillation (HCC)   3. Mitral valve insufficiency, unspecified etiology    PLAN:    In order of problems listed above:  CAD(LAD, LCx, RCA calcifications on chest CT).  Stable angina, chest pain controlled with sublingual nitro.  Patient is nonambulatory.  Echo shows normal EF.  Continue Eliquis  2.5 mg twice daily, Lipitor  80.  No plans for ischemic workup, patient is not a candidate for invasive testing.  Plan to medically manage CAD.  If chest pain becomes persistent, will consider Ranexa. Permanent A-fib, heart rate controlled off AV nodal agents.  Continue Eliquis  2.5 mg twice daily.  Lower dose of Eliquis  due to chronic anemia, history of blood  in stool, coughing up blood. Mild to moderate MR, serial monitoring.   Not a candidate for invasive testing or procedures.  Follow-up in 6 months.      Medication Adjustments/Labs and Tests Ordered: Current medicines are reviewed at length with the patient today.  Concerns regarding medicines are outlined above.  Orders Placed This Encounter  Procedures   EKG 12-Lead   No orders of the defined types were placed in this encounter.   Patient Instructions  Medication Instructions:  Your physician recommends that you continue on your current medications as directed. Please refer to the Current Medication list given to you today.   *If you need a refill on your cardiac medications before your next appointment, please call your pharmacy*  Lab Work: No labs ordered today  If you have labs (blood work) drawn today and your tests are completely normal, you will receive your results only by: MyChart Message (if you have MyChart) OR A paper copy in the mail If you have any lab test that is abnormal or we need to change your treatment, we will call you to review the results.  Testing/Procedures: No test ordered today   Follow-Up: At [facility], you and your health needs are our priority.  As part of our continuing mission to provide you with exceptional heart care, our providers are all part of one team.  This team includes your primary Cardiologist (physician) and Advanced Practice Providers or APPs (Physician Assistants and Nurse Practitioners) who all work together to provide you with the care you need, when you need it.  Your next appointment:   6 month(s)  Provider:   You may see Dr. Darliss or one of the following Advanced Practice Providers on your designated Care Team:   Lonni Meager, NP Lesley Maffucci, PA-C Bernardino Bring, PA-C Cadence Little Silver, PA-C Tylene Lunch, NP Barnie Hila, NP    We recommend signing up for the patient portal called MyChart.  Sign up information is provided on this After Visit Summary.  MyChart is used  to connect with patients for Virtual Visits (Telemedicine).  Patients are able to view lab/test results, encounter notes, upcoming appointments, etc.  Non-urgent messages can be sent to your provider as well.   To learn more about what you can do with MyChart, go to [email or web address].            Signed, Redell Darliss, MD  04/23/2024 4:55 PM    [facility]

## 2024-04-23 NOTE — Patient Instructions (Signed)
 Medication Instructions:  Your physician recommends that you continue on your current medications as directed. Please refer to the Current Medication list given to you today.   *If you need a refill on your cardiac medications before your next appointment, please call your pharmacy*  Lab Work: No labs ordered today  If you have labs (blood work) drawn today and your tests are completely normal, you will receive your results only by: MyChart Message (if you have MyChart) OR A paper copy in the mail If you have any lab test that is abnormal or we need to change your treatment, we will call you to review the results.  Testing/Procedures: No test ordered today   Follow-Up: At [facility], you and your health needs are our priority.  As part of our continuing mission to provide you with exceptional heart care, our providers are all part of one team.  This team includes your primary Cardiologist (physician) and Advanced Practice Providers or APPs (Physician Assistants and Nurse Practitioners) who all work together to provide you with the care you need, when you need it.  Your next appointment:   6 month(s)  Provider:   You may see Dr. Darliss or one of the following Advanced Practice Providers on your designated Care Team:   Lonni Meager, NP Lesley Maffucci, PA-C Bernardino Bring, PA-C Cadence Olyphant, PA-C Tylene Lunch, NP Barnie Hila, NP    We recommend signing up for the patient portal called MyChart.  Sign up information is provided on this After Visit Summary.  MyChart is used to connect with patients for Virtual Visits (Telemedicine).  Patients are able to view lab/test results, encounter notes, upcoming appointments, etc.  Non-urgent messages can be sent to your provider as well.   To learn more about what you can do with MyChart, go to [email or web address].

## 2024-04-24 ENCOUNTER — Encounter: Payer: Self-pay | Admitting: Internal Medicine

## 2024-07-11 ENCOUNTER — Other Ambulatory Visit (INDEPENDENT_AMBULATORY_CARE_PROVIDER_SITE_OTHER): Payer: Self-pay | Admitting: Nurse Practitioner

## 2024-07-11 ENCOUNTER — Encounter: Payer: Self-pay | Admitting: Internal Medicine

## 2024-07-11 DIAGNOSIS — N186 End stage renal disease: Secondary | ICD-10-CM

## 2024-07-11 DIAGNOSIS — L819 Disorder of pigmentation, unspecified: Secondary | ICD-10-CM

## 2024-07-18 ENCOUNTER — Ambulatory Visit (INDEPENDENT_AMBULATORY_CARE_PROVIDER_SITE_OTHER): Payer: Self-pay | Admitting: Nurse Practitioner

## 2024-07-18 ENCOUNTER — Encounter (INDEPENDENT_AMBULATORY_CARE_PROVIDER_SITE_OTHER): Payer: Self-pay | Admitting: Nurse Practitioner

## 2024-07-18 ENCOUNTER — Ambulatory Visit (INDEPENDENT_AMBULATORY_CARE_PROVIDER_SITE_OTHER)

## 2024-07-18 VITALS — BP 112/76 | HR 76 | Resp 18

## 2024-07-18 DIAGNOSIS — Z992 Dependence on renal dialysis: Secondary | ICD-10-CM | POA: Diagnosis not present

## 2024-07-18 DIAGNOSIS — L819 Disorder of pigmentation, unspecified: Secondary | ICD-10-CM

## 2024-07-18 DIAGNOSIS — N186 End stage renal disease: Secondary | ICD-10-CM | POA: Diagnosis not present

## 2024-07-18 DIAGNOSIS — S61402A Unspecified open wound of left hand, initial encounter: Secondary | ICD-10-CM

## 2024-07-29 ENCOUNTER — Encounter (INDEPENDENT_AMBULATORY_CARE_PROVIDER_SITE_OTHER): Payer: Self-pay | Admitting: Nurse Practitioner

## 2024-07-29 NOTE — Progress Notes (Signed)
 "  Subjective:    Patient ID: Cassandra Joseph, female    DOB: 1945/08/16, 78 y.o.   MRN: [id number] Chief Complaint  Patient presents with   New Patient (Initial Visit)    Ref Raven consult with HDA for right index discoloration, steal syndrome    HPI  Discussed the use of AI scribe software for clinical note transcription with the patient, who gave verbal consent to proceed.  History of Present Illness Cassandra Joseph is a 78 year old female with a left upper extremity arteriovenous fistula for hemodialysis who presents for evaluation of left hand discoloration and swelling.  She developed acute onset of purple discoloration and significant swelling of the left hand upon waking, without preceding trauma. She describes the discoloration as similar to a bruise, with no associated scab. The swelling has gradually improved, and the area is now drying and appears to be healing. She has not applied topical treatments since onset.  She notes her left hand becomes cold, particularly after dialysis and overnight, and experiences intermittent numbness, including at the time of the visit. She denies pain associated with these sensory changes and during dialysis sessions.  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 She denies any recent injury or trauma.  An upper extremity ultrasound prior to the visit was performed, with flow volume noted to be low at 727. Flow volume was noted to be low at 727.    Results Diagnostic Upper extremity ultrasound (07/18/2024): No evidence of steal syndrome. Flow volume 727.   Review of Systems     Objective:   Physical Exam  Physical Exam EXTREMITIES: Hand warm to touch. Good pulse in hand.  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 on HD x 11 years; b. felt to be 2/2 HTN; c. HD TTS   History of anemia due to chronic kidney disease    History of nuclear stress test    a. 08/2015: probably normal myocardial perfusion study, no evidence for significant ischemia or scar was noted, during stress, global systolic function normal, EF 54%, RV borderline dilatation, coronary artery calcifications and marked mitral annular calcifications were noted, liver calcifications were noted on attenuation CT scan likely represent sequelae of previous granulomatous disease   HLD (hyperlipidemia)    Hypertension    Ischemic stroke (HCC)    a. 03/2014   Left leg pain    Metabolic syndrome    Morbid obesity (HCC)    Multiple myeloma (HCC)    a. 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 2014   Viral pneumonia 08/08/2020    Social History   Socioeconomic History   Marital status: Unknown    Spouse name: Not on file   Number of children: Not on file   Years of education: Not on file   Highest education level: Not on file  Occupational History   Not  on file  Tobacco Use   Smoking status: Former    Types: Cigarettes   Smokeless tobacco: Never   Tobacco comments:    Quit in 2007  Substance and Sexual Activity   Alcohol  use: No   Drug use: No   Sexual activity: Not Currently  Other Topics Concern   Not on file  Social History Narrative   ** Merged History Encounter **       Independent at baseline, ambulates with a walker   Social Drivers of Health   Tobacco Use: Medium Risk (07/29/2024)   Patient History    Smoking Tobacco Use: Former    Smokeless Tobacco Use: Never    Passive Exposure: Not on file  Financial Resource Strain: Medium Risk (03/17/2022)    Received from [facility]   Overall Financial Resource Strain (CARDIA)    Difficulty of Paying Living Expenses: Somewhat hard  Food Insecurity: No Food Insecurity (01/25/2024)   Epic    Worried About Radiation Protection Practitioner of Food in the Last Year: Never true    Ran Out of Food in the Last Year: Never true  Transportation Needs: No Transportation Needs (01/25/2024)   Epic    Lack of Transportation (Medical): No    Lack of Transportation (Non-Medical): No  Physical Activity: Inactive (03/17/2022)   Received from [facility]   Exercise Vital Sign    On average, how many days per week do you engage in moderate to strenuous exercise (like a brisk walk)?: 0 days    On average, how many minutes do you engage in exercise at this level?: 0 min  Stress: Stress Concern Present (03/17/2022)   Received from [facility] - Occupational Stress Questionnaire    Feeling of Stress : Very much  Social Connections: Socially Integrated (01/25/2024)   Social Connection and Isolation Panel    Frequency of Communication with Friends and Family: More than three times a week    Frequency of Social Gatherings with Friends and Family: Not on file    Attends Religious Services: 1 to 4 times per year    Active Member of [employer] or Organizations: Yes    Attends Banker Meetings: 1 to 4 times per year    Marital Status: Married  Catering Manager Violence: Not At Risk (01/25/2024)   Epic    Fear of Current or Ex-Partner: No    Emotionally Abused: No    Physically Abused: No    Sexually Abused: No  Depression (PHQ2-9): Not on file  Alcohol  Screen: Not on file  Housing: Low Risk (01/25/2024)   Epic    Unable to Pay for Housing in the Last Year: No    Number of Times Moved in the Last Year: 0    Homeless in the Last Year: No  Utilities: Not At Risk (01/25/2024)   Epic    Threatened with loss of utilities: No  Health  Literacy: Not on file    Past Surgical History:  Procedure Laterality Date   ABDOMINAL HYSTERECTOMY     APPENDECTOMY     eyelid surgery     HERNIA REPAIR     right knee surgery      Family History  Problem Relation Age of Onset   Seizures Mother    Hypertension Mother    Throat cancer Father    Breast cancer Neg Hx     Allergies[1]     Latest Ref  Rng & Units 01/26/2024    1:09 AM 01/25/2024    4:48 AM 01/24/2024   10:07 AM  CBC  WBC 4.0 - 10.5 K/uL 4.6  4.5  6.7   Hemoglobin 12.0 - 15.0 g/dL 89.5  89.1  89.2   Hematocrit 36.0 - 46.0 % 31.0  32.9  33.0   Platelets 150 - 400 K/uL 124  130  143       CMP     Component Value Date/Time   NA 132 (L) 01/26/2024 0109   NA 138 03/26/2014 1108   K 4.1 01/26/2024 0109   K 3.8 03/26/2014 1108   CL 93 (L) 01/26/2024 0109   CL 98 03/26/2014 1108   CO2 27 01/26/2024 0109   CO2 31 03/26/2014 1108   GLUCOSE 89 01/26/2024 0109   GLUCOSE 76 03/26/2014 1108   BUN 41 (H) 01/26/2024 0109   BUN 19 (H) 03/26/2014 1108   CREATININE 6.70 (H) 01/26/2024 0109   CREATININE 5.17 (H) 03/26/2014 1108   CALCIUM  8.3 (L) 01/26/2024 0109   CALCIUM  8.2 (L) 03/26/2014 1108   PROT 8.7 (H) 01/24/2024 1007   PROT 8.6 (H) 03/26/2014 1108   ALBUMIN 3.6 01/24/2024 1007   ALBUMIN 3.0 (L) 03/26/2014 1108   AST 26 01/24/2024 1007   AST 16 03/26/2014 1108   ALT 15 01/24/2024 1007   ALT 9 (L) 03/26/2014 1108   ALKPHOS 59 01/24/2024 1007   ALKPHOS 82 03/26/2014 1108   BILITOT 1.2 01/24/2024 1007   BILITOT 0.3 03/26/2014 1108   GFRNONAA 6 (L) 01/26/2024 0109   GFRNONAA 8 (L) 03/26/2014 1108     No results found.     Assessment & Plan:   1. ESRD on hemodialysis (HCC) (Primary) Concern for arteriovenous fistula-associated hand ischemia (Steal syndrome) Intermittent hand symptoms raised concern for steal syndrome. Ultrasound showed no evidence of steal syndrome; flow volume slightly low but not concerning. Absence of severe symptoms supports  conservative management. - Recommended watchful waiting rather than invasive diagnostics. - Advised her to monitor for worsening symptoms, including increased pain, digital necrosis, or progression of numbness, and to report these promptly. - Scheduled follow-up in six weeks for re-evaluation.  2. Open wound of left hand with complication, initial encounter Healing wound of the hand Healing wound with scab near end stage, no signs of infection or worsening. Area remains dry but is improving. No indication for antibiotics or further intervention. - Advised to keep the area clean and apply a small amount of petrolatum  ointment to prevent excessive dryness. - Recommended against use of topical antibiotics such as Neosporin. - Reinforced monitoring for signs of worsening, including increased pain, darkening, or delayed healing. - Confirmed wound care clinic appointment for January; advised to keep appointment unless wound fully heals.   Assessment and Plan Assessment & Plan       Medications Ordered Prior to Encounter[2]  There are no Patient Instructions on file for this visit. Return for 6 weeks with HDA see GS/FB.   Kristl Morioka E Devonda Pequignot, NP      [1]  Allergies Allergen Reactions   Shellfish Allergy Shortness Of Breath   Shellfish Allergy Other (See Comments)    Unknown reaction  [2]  Current Outpatient Medications on File Prior to Visit  Medication Sig Dispense Refill   ALPRAZolam  (XANAX ) 0.25 MG tablet Take 0.25 mg by mouth every 8 (eight) hours as needed.     apixaban  (ELIQUIS ) 2.5 MG TABS tablet Take 1 tablet (2.5 mg total) by  mouth 2 (two) times daily. 60 tablet 2   atorvastatin  (LIPITOR ) 80 MG tablet Take 1 tablet (80 mg total) by mouth daily. 30 tablet 0   cinacalcet  (SENSIPAR ) 60 MG tablet Take 60 mg by mouth 2 (two) times daily.     citalopram  (CELEXA ) 10 MG tablet Take 10 mg by mouth daily.     docusate sodium  (COLACE) 100 MG capsule Take 100 mg by mouth 2 (two) times  daily.     epoetin  alfa (EPOGEN ) 4000 UNIT/ML injection Inject 1 mL (4,000 Units total) into the vein Every Tuesday,Thursday,and Saturday with dialysis. (Patient taking differently: Inject 4,000 Units into the vein every Monday, Wednesday, and Friday with hemodialysis.) 1 mL    fluticasone  (FLONASE ) 50 MCG/ACT nasal spray Place 1-2 sprays into both nostrils daily at 12 noon.     gabapentin  (NEURONTIN ) 300 MG capsule Take 300 mg by mouth 3 (three) times daily.     guaiFENesin  (MUCINEX ) 600 MG 12 hr tablet Take 1 tablet (600 mg total) by mouth 2 (two) times daily. 60 tablet 2   Hydrocortisone , Perianal, 1 % CREA Apply topically.     hydroxychloroquine  (PLAQUENIL ) 200 MG tablet Take 200 mg by mouth daily.     midodrine  (PROAMATINE ) 10 MG tablet Take 1 tablet (10 mg total) by mouth 3 (three) times daily with meals. 90 tablet 0   multivitamin (RENA-VIT) TABS tablet Take 1 tablet by mouth daily.     Nutritional Supplements (FEEDING SUPPLEMENT, NEPRO CARB STEADY,) LIQD Take 237 mLs by mouth 3 (three) times daily between meals.  0   polyethylene glycol (MIRALAX  / GLYCOLAX ) 17 g packet Take 17 g by mouth 2 (two) times daily. 14 each 0   senna (SENOKOT) 8.6 MG tablet Take 2 tablets by mouth at bedtime.     sevelamer  carbonate (RENVELA ) 800 MG tablet Take 1,600-2,400 mg by mouth 3 (three) times daily. Take 3 tabs with meals and 2 tabs with snacks     sodium chloride  (OCEAN) 0.65 % SOLN nasal spray Place 1 spray into both nostrils as needed for congestion. 50 mL 0   b complex-vitamin c-folic acid  (NEPHRO-VITE) 0.8 MG TABS tablet Take 1 tablet by mouth daily. (Patient not taking: Reported on 07/18/2024)  11   No current facility-administered medications on file prior to visit.   "

## 2024-08-26 ENCOUNTER — Other Ambulatory Visit (INDEPENDENT_AMBULATORY_CARE_PROVIDER_SITE_OTHER): Payer: Self-pay | Admitting: Nurse Practitioner

## 2024-08-26 DIAGNOSIS — N186 End stage renal disease: Secondary | ICD-10-CM

## 2024-08-29 ENCOUNTER — Ambulatory Visit (INDEPENDENT_AMBULATORY_CARE_PROVIDER_SITE_OTHER): Admitting: Nurse Practitioner

## 2024-08-29 ENCOUNTER — Encounter (INDEPENDENT_AMBULATORY_CARE_PROVIDER_SITE_OTHER)

## 2024-09-05 ENCOUNTER — Encounter: Admitting: Physician Assistant

## 2024-09-12 ENCOUNTER — Ambulatory Visit (INDEPENDENT_AMBULATORY_CARE_PROVIDER_SITE_OTHER): Admitting: Vascular Surgery

## 2024-09-12 ENCOUNTER — Encounter (INDEPENDENT_AMBULATORY_CARE_PROVIDER_SITE_OTHER)

## 2024-09-12 DIAGNOSIS — E782 Mixed hyperlipidemia: Secondary | ICD-10-CM

## 2024-09-12 DIAGNOSIS — I480 Paroxysmal atrial fibrillation: Secondary | ICD-10-CM

## 2024-09-12 DIAGNOSIS — N186 End stage renal disease: Secondary | ICD-10-CM

## 2024-09-12 DIAGNOSIS — I10 Essential (primary) hypertension: Secondary | ICD-10-CM

## 2024-10-24 ENCOUNTER — Encounter (INDEPENDENT_AMBULATORY_CARE_PROVIDER_SITE_OTHER)

## 2024-10-24 ENCOUNTER — Ambulatory Visit (INDEPENDENT_AMBULATORY_CARE_PROVIDER_SITE_OTHER): Admitting: Vascular Surgery
